# Patient Record
Sex: Female | Born: 1965 | Race: White | Hispanic: No | Marital: Married | Smoking: Never smoker
Health system: Southern US, Community
[De-identification: ages and names within clinical notes are randomized; demographics above are authoritative.]

## PROBLEM LIST (undated history)

## (undated) DIAGNOSIS — Z8709 Personal history of other diseases of the respiratory system: Secondary | ICD-10-CM

## (undated) DIAGNOSIS — T8859XA Other complications of anesthesia, initial encounter: Secondary | ICD-10-CM

## (undated) DIAGNOSIS — N200 Calculus of kidney: Secondary | ICD-10-CM

## (undated) DIAGNOSIS — IMO0001 Reserved for inherently not codable concepts without codable children: Secondary | ICD-10-CM

## (undated) DIAGNOSIS — G4730 Sleep apnea, unspecified: Secondary | ICD-10-CM

## (undated) DIAGNOSIS — K648 Other hemorrhoids: Secondary | ICD-10-CM

## (undated) DIAGNOSIS — K760 Fatty (change of) liver, not elsewhere classified: Secondary | ICD-10-CM

## (undated) DIAGNOSIS — K219 Gastro-esophageal reflux disease without esophagitis: Secondary | ICD-10-CM

## (undated) DIAGNOSIS — M1990 Unspecified osteoarthritis, unspecified site: Secondary | ICD-10-CM

## (undated) DIAGNOSIS — E669 Obesity, unspecified: Secondary | ICD-10-CM

## (undated) DIAGNOSIS — G8929 Other chronic pain: Secondary | ICD-10-CM

## (undated) DIAGNOSIS — E119 Type 2 diabetes mellitus without complications: Secondary | ICD-10-CM

## (undated) DIAGNOSIS — J45909 Unspecified asthma, uncomplicated: Secondary | ICD-10-CM

## (undated) DIAGNOSIS — M25562 Pain in left knee: Secondary | ICD-10-CM

## (undated) DIAGNOSIS — T4145XA Adverse effect of unspecified anesthetic, initial encounter: Secondary | ICD-10-CM

## (undated) DIAGNOSIS — N289 Disorder of kidney and ureter, unspecified: Secondary | ICD-10-CM

## (undated) HISTORY — DX: Sleep apnea, unspecified: G47.30

## (undated) HISTORY — PX: CHOLECYSTECTOMY: SHX55

## (undated) HISTORY — DX: Other chronic pain: G89.29

## (undated) HISTORY — DX: Other hemorrhoids: K64.8

## (undated) HISTORY — DX: Other complications of anesthesia, initial encounter: T88.59XA

## (undated) HISTORY — DX: Type 2 diabetes mellitus without complications: E11.9

## (undated) HISTORY — PX: OOPHORECTOMY: SHX86

## (undated) HISTORY — DX: Gastro-esophageal reflux disease without esophagitis: K21.9

## (undated) HISTORY — PX: TUBAL LIGATION: SHX77

## (undated) HISTORY — DX: Fatty (change of) liver, not elsewhere classified: K76.0

## (undated) HISTORY — DX: Other chronic pain: M25.562

## (undated) HISTORY — PX: HERNIA REPAIR: SHX51

## (undated) HISTORY — DX: Adverse effect of unspecified anesthetic, initial encounter: T41.45XA

## (undated) HISTORY — PX: UPPER GASTROINTESTINAL ENDOSCOPY: SHX188

## (undated) HISTORY — DX: Unspecified asthma, uncomplicated: J45.909

## (undated) HISTORY — PX: KIDNEY STONE SURGERY: SHX686

---

## 2008-05-22 ENCOUNTER — Emergency Department (HOSPITAL_COMMUNITY): Admission: EM | Admit: 2008-05-22 | Discharge: 2008-05-22 | Payer: Self-pay | Admitting: Emergency Medicine

## 2009-05-12 ENCOUNTER — Emergency Department (HOSPITAL_COMMUNITY): Admission: EM | Admit: 2009-05-12 | Discharge: 2009-05-12 | Payer: Self-pay | Admitting: Emergency Medicine

## 2009-12-23 ENCOUNTER — Emergency Department (HOSPITAL_COMMUNITY): Admission: EM | Admit: 2009-12-23 | Discharge: 2009-12-23 | Payer: Self-pay | Admitting: Emergency Medicine

## 2010-05-19 ENCOUNTER — Emergency Department (HOSPITAL_COMMUNITY)
Admission: EM | Admit: 2010-05-19 | Discharge: 2010-05-19 | Disposition: A | Discharge status: Home / Self Care | Payer: Self-pay | Attending: Emergency Medicine | Admitting: Emergency Medicine

## 2010-05-19 DIAGNOSIS — R059 Cough, unspecified: Secondary | ICD-10-CM | POA: Insufficient documentation

## 2010-05-19 DIAGNOSIS — R062 Wheezing: Secondary | ICD-10-CM | POA: Insufficient documentation

## 2010-05-19 DIAGNOSIS — J40 Bronchitis, not specified as acute or chronic: Secondary | ICD-10-CM | POA: Insufficient documentation

## 2010-05-19 DIAGNOSIS — R05 Cough: Secondary | ICD-10-CM | POA: Insufficient documentation

## 2010-06-01 ENCOUNTER — Emergency Department (HOSPITAL_COMMUNITY)
Admission: EM | Admit: 2010-06-01 | Discharge: 2010-06-01 | Disposition: A | Discharge status: Home / Self Care | Payer: Self-pay | Attending: Emergency Medicine | Admitting: Emergency Medicine

## 2010-06-01 DIAGNOSIS — M545 Low back pain, unspecified: Secondary | ICD-10-CM | POA: Insufficient documentation

## 2010-06-01 DIAGNOSIS — S335XXA Sprain of ligaments of lumbar spine, initial encounter: Secondary | ICD-10-CM | POA: Insufficient documentation

## 2010-06-01 DIAGNOSIS — X58XXXA Exposure to other specified factors, initial encounter: Secondary | ICD-10-CM | POA: Insufficient documentation

## 2010-06-01 LAB — URINALYSIS, ROUTINE W REFLEX MICROSCOPIC
Bilirubin Urine: NEGATIVE
Glucose, UA: NEGATIVE mg/dL
Hgb urine dipstick: NEGATIVE
Ketones, ur: NEGATIVE mg/dL
Nitrite: NEGATIVE
Protein, ur: NEGATIVE mg/dL
Specific Gravity, Urine: 1.026 (ref 1.005–1.030)
Urobilinogen, UA: 0.2 mg/dL (ref 0.0–1.0)
pH: 5.5 (ref 5.0–8.0)

## 2010-06-01 LAB — URINE MICROSCOPIC-ADD ON

## 2010-12-02 ENCOUNTER — Emergency Department (HOSPITAL_COMMUNITY)
Admission: EM | Admit: 2010-12-02 | Discharge: 2010-12-02 | Disposition: A | Discharge status: Home / Self Care | Payer: Self-pay | Attending: Emergency Medicine | Admitting: Emergency Medicine

## 2010-12-02 DIAGNOSIS — L298 Other pruritus: Secondary | ICD-10-CM | POA: Insufficient documentation

## 2010-12-02 DIAGNOSIS — B353 Tinea pedis: Secondary | ICD-10-CM | POA: Insufficient documentation

## 2010-12-02 DIAGNOSIS — M79609 Pain in unspecified limb: Secondary | ICD-10-CM | POA: Insufficient documentation

## 2010-12-02 DIAGNOSIS — E669 Obesity, unspecified: Secondary | ICD-10-CM | POA: Insufficient documentation

## 2010-12-02 DIAGNOSIS — L2989 Other pruritus: Secondary | ICD-10-CM | POA: Insufficient documentation

## 2010-12-02 LAB — GLUCOSE, CAPILLARY: Glucose-Capillary: 123 mg/dL — ABNORMAL HIGH (ref 70–99)

## 2010-12-09 ENCOUNTER — Emergency Department (HOSPITAL_COMMUNITY)
Admission: EM | Admit: 2010-12-09 | Discharge: 2010-12-09 | Disposition: A | Discharge status: Home / Self Care | Payer: Self-pay | Attending: Emergency Medicine | Admitting: Emergency Medicine

## 2010-12-09 DIAGNOSIS — L02219 Cutaneous abscess of trunk, unspecified: Secondary | ICD-10-CM | POA: Insufficient documentation

## 2010-12-09 DIAGNOSIS — L03319 Cellulitis of trunk, unspecified: Secondary | ICD-10-CM | POA: Insufficient documentation

## 2011-08-28 ENCOUNTER — Emergency Department (HOSPITAL_COMMUNITY): Payer: Medicaid Other

## 2011-08-28 ENCOUNTER — Emergency Department (HOSPITAL_COMMUNITY)
Admission: EM | Admit: 2011-08-28 | Discharge: 2011-08-28 | Disposition: A | Discharge status: Home Health | Payer: Medicaid Other | Attending: Emergency Medicine | Admitting: Emergency Medicine

## 2011-08-28 ENCOUNTER — Encounter (HOSPITAL_COMMUNITY): Payer: Self-pay | Admitting: *Deleted

## 2011-08-28 DIAGNOSIS — R109 Unspecified abdominal pain: Secondary | ICD-10-CM | POA: Insufficient documentation

## 2011-08-28 DIAGNOSIS — N201 Calculus of ureter: Secondary | ICD-10-CM | POA: Insufficient documentation

## 2011-08-28 DIAGNOSIS — K429 Umbilical hernia without obstruction or gangrene: Secondary | ICD-10-CM | POA: Insufficient documentation

## 2011-08-28 DIAGNOSIS — N132 Hydronephrosis with renal and ureteral calculous obstruction: Secondary | ICD-10-CM

## 2011-08-28 DIAGNOSIS — R112 Nausea with vomiting, unspecified: Secondary | ICD-10-CM | POA: Insufficient documentation

## 2011-08-28 LAB — URINALYSIS, ROUTINE W REFLEX MICROSCOPIC
Glucose, UA: NEGATIVE mg/dL
Ketones, ur: 40 mg/dL — AB
Nitrite: POSITIVE — AB
Protein, ur: 30 mg/dL — AB
Specific Gravity, Urine: 1.033 — ABNORMAL HIGH (ref 1.005–1.030)
Urobilinogen, UA: >8 mg/dL — ABNORMAL HIGH (ref 0.0–1.0)
pH: 5 (ref 5.0–8.0)

## 2011-08-28 LAB — URINE MICROSCOPIC-ADD ON

## 2011-08-28 MED ORDER — CEPHALEXIN 500 MG PO CAPS: 500.0000 mg | ORAL_CAPSULE | Freq: Four times a day (QID) | ORAL | Status: DC

## 2011-08-28 MED ORDER — ONDANSETRON HCL 8 MG PO TABS: 8.0000 mg | ORAL_TABLET | Freq: Three times a day (TID) | ORAL | Status: DC | PRN

## 2011-08-28 MED ORDER — KETOROLAC TROMETHAMINE 30 MG/ML IJ SOLN
30.0000 mg | Freq: Once | INTRAMUSCULAR | Status: AC
  Administered 2011-08-28: 30 mg via INTRAVENOUS
  Filled 2011-08-28: qty 1

## 2011-08-28 MED ORDER — OXYCODONE-ACETAMINOPHEN 5-325 MG PO TABS: 1.0000 | ORAL_TABLET | Freq: Four times a day (QID) | ORAL | Status: DC | PRN

## 2011-08-28 MED ORDER — ONDANSETRON HCL 4 MG/2ML IJ SOLN
4.0000 mg | Freq: Once | INTRAMUSCULAR | Status: AC
  Administered 2011-08-28: 4 mg via INTRAVENOUS
  Filled 2011-08-28: qty 2

## 2011-08-28 MED ORDER — HYDROMORPHONE HCL PF 1 MG/ML IJ SOLN
1.0000 mg | Freq: Once | INTRAMUSCULAR | Status: AC
  Administered 2011-08-28: 1 mg via INTRAVENOUS
  Filled 2011-08-28: qty 1

## 2011-08-28 NOTE — Discharge Instructions (Signed)
Drink plenty of fluids. Strain urine. Take motrin or aleve as need for pain. You may also take percocet as need for pain - no driving for the next 6 hours or when taking percocet. Also, do not take tylenol or acetaminophen containing medications when taking percocet. Take zofran as need for nausea. Take antibiotic as prescribed for possible urine infection.   Follow up with urologist in next few days - call office tomorrow morning to arrange appointment.  Return to [facility] ([facility] is the hospital where our urologist take care of their patients) if worse, intractable pain, fevers, persistent vomiting, other concern.   Your ct scan shows a large 1 by 1.5 cm kidney stone at the left UPJ - discuss with urologist at follow up.  The ct also made incidental note of umbilical area hernia - follow up with general surgeon in the next few weeks.   Your blood pressure is high today - follow up with primary care doctor in the next few weeks.      Kidney Stones Kidney stones (ureteral lithiasis) are deposits that form inside your kidneys. The intense pain is caused by the stone moving through the urinary tract. When the stone moves, the ureter goes into spasm around the stone. The stone is usually passed in the urine.  CAUSES   A disorder that makes certain neck glands produce too much parathyroid hormone (primary hyperparathyroidism).   A buildup of uric acid crystals.   Narrowing (stricture) of the ureter.   A kidney obstruction present at birth (congenital obstruction).   Previous surgery on the kidney or ureters.   Numerous kidney infections.  SYMPTOMS   Feeling sick to your stomach (nauseous).   Throwing up (vomiting).   Blood in the urine (hematuria).   Pain that usually spreads (radiates) to the groin.   Frequency or urgency of urination.  DIAGNOSIS   Taking a history and physical exam.   Blood or urine tests.   Computerized X-ray scan (CT scan).   Occasionally, an  examination of the inside of the urinary bladder (cystoscopy) is performed.  TREATMENT   Observation.   Increasing your fluid intake.   Surgery may be needed if you have severe pain or persistent obstruction.  The size, location, and chemical composition are all important variables that will determine the proper choice of action for you. Talk to your caregiver to better understand your situation so that you will minimize the risk of injury to yourself and your kidney.  HOME CARE INSTRUCTIONS   Drink enough water and fluids to keep your urine clear or pale yellow.   Strain all urine through the provided strainer. Keep all particulate matter and stones for your caregiver to see. The stone causing the pain may be as small as a grain of salt. It is very important to use the strainer each and every time you pass your urine. The collection of your stone will allow your caregiver to analyze it and verify that a stone has actually passed.   Only take over-the-counter or prescription medicines for pain, discomfort, or fever as directed by your caregiver.   Make a follow-up appointment with your caregiver as directed.   Get follow-up X-rays if required. The absence of pain does not always mean that the stone has passed. It may have only stopped moving. If the urine remains completely obstructed, it can cause loss of kidney function or even complete destruction of the kidney. It is your responsibility to make sure  X-rays and follow-ups are completed. Ultrasounds of the kidney can show blockages and the status of the kidney. Ultrasounds are not associated with any radiation and can be performed easily in a matter of minutes.  SEEK IMMEDIATE MEDICAL CARE IF:   Pain cannot be controlled with the prescribed medicine.   You have a fever.   The severity or intensity of pain increases over 18 hours and is not relieved by pain medicine.   You develop a new onset of abdominal pain.   You feel faint or pass  out.  MAKE SURE YOU:   Understand these instructions.   Will watch your condition.   Will get help right away if you are not doing well or get worse.  Document Released: 03/05/2005 Document Revised: 02/22/2011 Document Reviewed: 07/01/2009 [facility] Patient Information 2012 [place], [place].       Hernia A hernia occurs when an internal organ pushes out through a weak spot in the abdominal wall. Hernias most commonly occur in the groin and around the navel. Hernias often can be pushed back into place (reduced). Most hernias tend to get worse over time. Some abdominal hernias can get stuck in the opening (irreducible or incarcerated hernia) and cannot be reduced. An irreducible abdominal hernia which is tightly squeezed into the opening is at risk for impaired blood supply (strangulated hernia). A strangulated hernia is a medical emergency. Because of the risk for an irreducible or strangulated hernia, surgery may be recommended to repair a hernia. CAUSES   Heavy lifting.   Prolonged coughing.   Straining to have a bowel movement.   A cut (incision) made during an abdominal surgery.  HOME CARE INSTRUCTIONS   Bed rest is not required. You may continue your normal activities.   Avoid lifting more than 10 pounds (4.5 kg) or straining.   Cough gently. If you are a smoker it is best to stop. Even the best hernia repair can break down with the continual strain of coughing. Even if you do not have your hernia repaired, a cough will continue to aggravate the problem.   Do not wear anything tight over your hernia. Do not try to keep it in with an outside bandage or truss. These can damage abdominal contents if they are trapped within the hernia sac.   Eat a normal diet.   Avoid constipation. Straining over long periods of time will increase hernia size and encourage breakdown of repairs. If you cannot do this with diet alone, stool softeners may be used.  SEEK IMMEDIATE MEDICAL CARE IF:     You have a fever.   You develop increasing abdominal pain.   You feel nauseous or vomit.   Your hernia is stuck outside the abdomen, looks discolored, feels hard, or is tender.   You have any changes in your bowel habits or in the hernia that are unusual for you.   You have increased pain or swelling around the hernia.   You cannot push the hernia back in place by applying gentle pressure while lying down.  MAKE SURE YOU:   Understand these instructions.   Will watch your condition.   Will get help right away if you are not doing well or get worse.  Document Released: 03/05/2005 Document Revised: 02/22/2011 Document Reviewed: 10/23/2007 [facility] Patient Information 2012 [place], [place].

## 2011-08-28 NOTE — ED Provider Notes (Signed)
History    This chart was scribed for Suzi Roots, MD, MD by Smitty Pluck. The patient was seen in room STRE8 and the patient's care was started at 1:19PM.   CSN: [id number]  Arrival date & time 08/28/11  1208   None     Chief Complaint  Patient presents with  . Back Pain    (Consider location/radiation/quality/duration/timing/severity/associated sxs/prior treatment) Patient is a 46 y.o. female presenting with back pain. The history is provided by the patient.  Back Pain  Pertinent negatives include no fever, no abdominal pain and no dysuria.   Rita Bass is a 46 y.o. female who presents to the Emergency Department complaining of moderate lower left side back pain radiating to abdominal area onset today this AM. Pain is dull. Constant. Sudden onset at rest. Denies dysuria. Reports that LMP was 08-07-11. Denies vaginal discharge and bleeding. Denies leg pain. Symptoms have been constant. She denies injury, twisting or lifting. Reports nausea and vomiting. Denies dysuria, fever and chills. Pt denies any other pain. Having normal bms. No diarrhea or constipation. No trauma or strain to back.   History reviewed. No pertinent past medical history.  Past Surgical History  Procedure Date  . Oophorectomy   . Cesarean section   . Cholecystectomy   . Hernia repair     No family history on file.  History  Substance Use Topics  . Smoking status: Never Smoker   . Smokeless tobacco: Not on file  . Alcohol Use: No    OB History    Grav Para Term Preterm Abortions TAB SAB Ect Mult Living                  Review of Systems  Constitutional: Negative for fever and chills.  Gastrointestinal: Positive for nausea and vomiting. Negative for abdominal pain, diarrhea and constipation.  Genitourinary: Negative for dysuria, frequency, hematuria, flank pain, vaginal bleeding, vaginal discharge and menstrual problem.  Musculoskeletal: Positive for back pain.    Allergies  Review of  patient's allergies indicates no known allergies.  Home Medications   Current Outpatient Rx  Name Route Sig Dispense Refill  . IBUPROFEN 200 MG PO TABS Oral Take 800 mg by mouth every 8 (eight) hours as needed. For pain      BP 151/79  Pulse 95  Temp(Src) 98.2 F (36.8 C) (Oral)  Resp 18  Ht 5\' 4"  (1.626 m)  Wt 280 lb (127.007 kg)  BMI 48.06 kg/m2  SpO2 97%  Physical Exam  Nursing note and vitals reviewed. Constitutional: She is oriented to person, place, and time. She appears well-developed and well-nourished. No distress.  HENT:  Head: Normocephalic and atraumatic.  Eyes: Conjunctivae are normal. Pupils are equal, round, and reactive to light. No scleral icterus.  Neck: Normal range of motion. Neck supple. No tracheal deviation present.  Cardiovascular: Normal rate, regular rhythm, normal heart sounds and intact distal pulses.   Pulmonary/Chest: Effort normal and breath sounds normal. No respiratory distress.  Abdominal: Soft. Bowel sounds are normal. She exhibits no distension and no mass. There is no tenderness. There is no rebound and no guarding.       No flank tenderness. No hernia.    Genitourinary:       No cva tenderness  Musculoskeletal: Normal range of motion.       tls spine non tender, aligned, no step off.   Neurological: She is alert and oriented to person, place, and time.  Motor intact bil. Steady gait. Straight leg raise neg.   Skin: Skin is warm and dry. No rash noted.       No skin lesions or shingles in area of pain  Psychiatric: She has a normal mood and affect. Her behavior is normal.    ED Course  Procedures (including critical care time) DIAGNOSTIC STUDIES: Oxygen Saturation is 97% on room air, normal by my interpretation.    COORDINATION OF CARE: 1:22PM EDP discusses pt ED treatment with pt  1:22PM EDP orders medication: Toradol 30 mg, dilaudid 1 mg, Zofran 4 mg   Results for orders placed during the hospital encounter of 08/28/11    URINALYSIS, ROUTINE W REFLEX MICROSCOPIC      Component Value Range   Color, Urine ORANGE (*) YELLOW    APPearance HAZY (*) CLEAR    Specific Gravity, Urine 1.033 (*) 1.005 - 1.030    pH 5.0  5.0 - 8.0    Glucose, UA NEGATIVE  NEGATIVE (mg/dL)   Hgb urine dipstick NEGATIVE  NEGATIVE    Bilirubin Urine SMALL (*) NEGATIVE    Ketones, ur 40 (*) NEGATIVE (mg/dL)   Protein, ur 30 (*) NEGATIVE (mg/dL)   Urobilinogen, UA >4.0 (*) 0.0 - 1.0 (mg/dL)   Nitrite POSITIVE (*) NEGATIVE    Leukocytes, UA LARGE (*) NEGATIVE   URINE MICROSCOPIC-ADD ON      Component Value Range   Squamous Epithelial / LPF MANY (*) RARE    WBC, UA 3-6  <3 (WBC/hpf)   RBC / HPF 0-2  <3 (RBC/hpf)   Bacteria, UA RARE  RARE    Casts GRANULAR CAST (*) NEGATIVE    Crystals CA OXALATE CRYSTALS (*) NEGATIVE    Ct Abdomen Pelvis Wo Contrast  08/28/2011  *RADIOLOGY REPORT*  Clinical Data: Acute left flank pain.  Rule out ureteral calculus  CT ABDOMEN AND PELVIS WITHOUT CONTRAST  Technique:  Multidetector CT imaging of the abdomen and pelvis was performed following the standard protocol without intravenous contrast.  Comparison: None  Findings: The lung bases are clear.  No pericardial or pleural effusion.  Previous cholecystectomy.  No suspicious liver abnormality.  No biliary ductal dilatation.  The pancreas is normal.  The spleen is normal.  Both adrenal glands are unremarkable.  Normal appearance of the right kidney.  There is a left-sided nephromegaly, perinephric fat stranding and hydronephrosis.  At the left UPJ there is a 0.9 x 1.0 x 1.5 cm calculus.  No additional renal calculi identified.  The urinary bladder appears normal.  The uterus and the adnexal structures are unremarkable.  The stomach is normal.  The small bowel loops have a normal course and caliber.  Normal appearance of the colon  No free fluid or fluid collections within the abdomen or pelvis. There are multiple peri umbilical hernia as involving the ventral  abdominal wall.  These contains omental fat only.  Review of the visualized osseous structures is unremarkable.  IMPRESSION:  1.  Left UPJ stone with resultant obstructive uropathy.  Original Report Authenticated By: Rosealee Albee, M.D.         MDM  I personally performed the services described in this documentation, which was scribed in my presence. The recorded information has been reviewed and considered. Suzi Roots, MD   Labs sent. Iv ns. Dilaudid 1 mg iv. zofran iv. toradol iv. Note - pt states she did not drive here, has ride home.   Recheck pain resolved. abd soft nt.   Discussed  ct w pt and that given size of stone, good chance that it will not pass on its own and need for close urology follow up.        Suzi Roots, MD 08/28/11 [phone]

## 2011-08-28 NOTE — ED Notes (Signed)
Patient complains of left sided lower back pain, lower abd pain and nausea.  She states her pain started last night and is severe.  Patient reports normal urine output, denies pain when voiding

## 2011-08-30 ENCOUNTER — Encounter (HOSPITAL_COMMUNITY): Payer: Self-pay | Admitting: *Deleted

## 2011-08-30 ENCOUNTER — Emergency Department (HOSPITAL_COMMUNITY): Admission: EM | Admit: 2011-08-30 | Discharge: 2011-08-30 | Discharge status: Against Medical Advice | Payer: Self-pay

## 2011-08-30 ENCOUNTER — Emergency Department (HOSPITAL_COMMUNITY)
Admission: EM | Admit: 2011-08-30 | Discharge: 2011-08-31 | Discharge status: Against Medical Advice | Payer: Medicaid Other | Attending: Emergency Medicine | Admitting: Emergency Medicine

## 2011-08-30 DIAGNOSIS — R109 Unspecified abdominal pain: Secondary | ICD-10-CM | POA: Insufficient documentation

## 2011-08-30 DIAGNOSIS — Z87442 Personal history of urinary calculi: Secondary | ICD-10-CM | POA: Insufficient documentation

## 2011-08-30 NOTE — ED Notes (Signed)
Called for room; not answered

## 2011-08-30 NOTE — ED Notes (Signed)
Pt in ER waiting; left and went home and called EMS; brought back in by EMS; c/o left flank and back pain; diagnosed with kidney stones/hernia on Tuesday; ambulatory on arrival

## 2011-08-31 ENCOUNTER — Encounter (HOSPITAL_COMMUNITY): Payer: Self-pay | Admitting: Physical Medicine and Rehabilitation

## 2011-08-31 ENCOUNTER — Emergency Department (HOSPITAL_COMMUNITY)
Admission: EM | Admit: 2011-08-31 | Discharge: 2011-08-31 | Disposition: A | Discharge status: Home / Self Care | Payer: Medicaid Other | Attending: Emergency Medicine | Admitting: Emergency Medicine

## 2011-08-31 DIAGNOSIS — Z9089 Acquired absence of other organs: Secondary | ICD-10-CM | POA: Insufficient documentation

## 2011-08-31 DIAGNOSIS — N200 Calculus of kidney: Secondary | ICD-10-CM

## 2011-08-31 LAB — URINALYSIS, ROUTINE W REFLEX MICROSCOPIC
Bilirubin Urine: NEGATIVE
Glucose, UA: NEGATIVE mg/dL
Hgb urine dipstick: NEGATIVE
Ketones, ur: NEGATIVE mg/dL
Nitrite: NEGATIVE
Protein, ur: NEGATIVE mg/dL
Specific Gravity, Urine: 1.008 (ref 1.005–1.030)
pH: 6 (ref 5.0–8.0)

## 2011-08-31 LAB — URINE MICROSCOPIC-ADD ON

## 2011-08-31 MED ORDER — HYDROMORPHONE HCL PF 2 MG/ML IJ SOLN
2.0000 mg | Freq: Once | INTRAMUSCULAR | Status: AC
  Administered 2011-08-31: 2 mg via INTRAMUSCULAR
  Filled 2011-08-31: qty 1

## 2011-08-31 MED ORDER — IBUPROFEN 600 MG PO TABS: 600.0000 mg | ORAL_TABLET | Freq: Four times a day (QID) | ORAL | Status: AC | PRN

## 2011-08-31 MED ORDER — ONDANSETRON HCL 4 MG/2ML IJ SOLN
4.0000 mg | Freq: Once | INTRAMUSCULAR | Status: AC
  Administered 2011-08-31: 4 mg via INTRAMUSCULAR
  Filled 2011-08-31: qty 2

## 2011-08-31 MED ORDER — KETOROLAC TROMETHAMINE 60 MG/2ML IM SOLN
60.0000 mg | Freq: Once | INTRAMUSCULAR | Status: AC
  Administered 2011-08-31: 60 mg via INTRAMUSCULAR
  Filled 2011-08-31: qty 2

## 2011-08-31 MED ORDER — OXYCODONE-ACETAMINOPHEN 5-325 MG PO TABS: 1.0000 | ORAL_TABLET | ORAL | Status: AC | PRN

## 2011-08-31 NOTE — ED Notes (Signed)
Pt presents to department for evaluation of L sided flank pain radiating around to front of abdomen. Was seen [facility] last Tuesday for same and discharged home. States some relief with pain medication at home. Denies pain with urination, denies blood in urine. 10/10 pain upon arrival. Also states nausea. She is alert and oriented x4.

## 2011-08-31 NOTE — Discharge Instructions (Signed)
You have a very large kidney stone which most likely will not pass on its own. It is important that you get in with urology as soon as able. i spoke with Dr. Berneice Heinrich today, he instructed you to call office Monday and see if you can be worked in. Please explain to the office staff you were in ED twice and we spoke with a urologist. Continue pain medications. If pain worsens, or develop fever, go to [facility].   Kidney Stones Kidney stones (ureteral lithiasis) are deposits that form inside your kidneys. The intense pain is caused by the stone moving through the urinary tract. When the stone moves, the ureter goes into spasm around the stone. The stone is usually passed in the urine.  CAUSES   A disorder that makes certain neck glands produce too much parathyroid hormone (primary hyperparathyroidism).   A buildup of uric acid crystals.   Narrowing (stricture) of the ureter.   A kidney obstruction present at birth (congenital obstruction).   Previous surgery on the kidney or ureters.   Numerous kidney infections.  SYMPTOMS   Feeling sick to your stomach (nauseous).   Throwing up (vomiting).   Blood in the urine (hematuria).   Pain that usually spreads (radiates) to the groin.   Frequency or urgency of urination.  DIAGNOSIS   Taking a history and physical exam.   Blood or urine tests.   Computerized X-ray scan (CT scan).   Occasionally, an examination of the inside of the urinary bladder (cystoscopy) is performed.  TREATMENT   Observation.   Increasing your fluid intake.   Surgery may be needed if you have severe pain or persistent obstruction.  The size, location, and chemical composition are all important variables that will determine the proper choice of action for you. Talk to your caregiver to better understand your situation so that you will minimize the risk of injury to yourself and your kidney.  HOME CARE INSTRUCTIONS   Drink enough water and fluids to keep your  urine clear or pale yellow.   Strain all urine through the provided strainer. Keep all particulate matter and stones for your caregiver to see. The stone causing the pain may be as small as a grain of salt. It is very important to use the strainer each and every time you pass your urine. The collection of your stone will allow your caregiver to analyze it and verify that a stone has actually passed.   Only take over-the-counter or prescription medicines for pain, discomfort, or fever as directed by your caregiver.   Make a follow-up appointment with your caregiver as directed.   Get follow-up X-rays if required. The absence of pain does not always mean that the stone has passed. It may have only stopped moving. If the urine remains completely obstructed, it can cause loss of kidney function or even complete destruction of the kidney. It is your responsibility to make sure X-rays and follow-ups are completed. Ultrasounds of the kidney can show blockages and the status of the kidney. Ultrasounds are not associated with any radiation and can be performed easily in a matter of minutes.  SEEK IMMEDIATE MEDICAL CARE IF:   Pain cannot be controlled with the prescribed medicine.   You have a fever.   The severity or intensity of pain increases over 18 hours and is not relieved by pain medicine.   You develop a new onset of abdominal pain.   You feel faint or pass out.  MAKE  SURE YOU:   Understand these instructions.   Will watch your condition.   Will get help right away if you are not doing well or get worse.  Document Released: 03/05/2005 Document Revised: 02/22/2011 Document Reviewed: 07/01/2009 [facility] Patient Information 2012 [place], [place].

## 2011-08-31 NOTE — ED Provider Notes (Signed)
History     CSN: [id number]  Arrival date & time 08/31/11  1745   First MD Initiated Contact with Patient 08/31/11 2002      Chief Complaint  Patient presents with  . Flank Pain    (Consider location/radiation/quality/duration/timing/severity/associated sxs/prior treatment) Patient is a 46 y.o. female presenting with flank pain. The history is provided by the patient.  Flank Pain This is a new problem. The current episode started in the past 7 days. The problem occurs constantly. The problem has been unchanged. Associated symptoms include abdominal pain, nausea, urinary symptoms and vomiting. Pertinent negatives include no anorexia, chills, fever, numbness or weakness. Nothing aggravates the symptoms. She has tried nothing for the symptoms.  Pt states she was diagnosed with a kidney stone 3 days ago. States was given pain medications and follow up with urology. Unable to get an appointment until next wed (6 days from today.) States pain constat, nausea, vomiting. No fever. States almost out of pain medications. States pain medications only help slightly.   No past medical history on file.  Past Surgical History  Procedure Date  . Oophorectomy   . Cesarean section   . Cholecystectomy   . Hernia repair     No family history on file.  History  Substance Use Topics  . Smoking status: Never Smoker   . Smokeless tobacco: Not on file  . Alcohol Use: No    OB History    Grav Para Term Preterm Abortions TAB SAB Ect Mult Living                  Review of Systems  Constitutional: Negative for fever and chills.  Respiratory: Negative.   Cardiovascular: Negative.   Gastrointestinal: Positive for nausea, vomiting and abdominal pain. Negative for anorexia.  Genitourinary: Positive for dysuria and flank pain. Negative for urgency and hematuria.  Neurological: Negative for dizziness, weakness and numbness.    Allergies  Review of patient's allergies indicates no known  allergies.  Home Medications   Current Outpatient Rx  Name Route Sig Dispense Refill  . CEPHALEXIN 500 MG PO CAPS Oral Take 500 mg by mouth 4 (four) times daily.    Marland Kitchen ONDANSETRON HCL 8 MG PO TABS Oral Take 8 mg by mouth every 8 (eight) hours as needed. For nausea    . OXYCODONE-ACETAMINOPHEN 5-325 MG PO TABS Oral Take 1-2 tablets by mouth every 6 (six) hours as needed. For pain      BP 166/105  Pulse 80  Temp 98.3 F (36.8 C) (Oral)  Resp 22  SpO2 97%  LMP 08/07/2011  Physical Exam  Nursing note and vitals reviewed. Constitutional: She appears well-developed and well-nourished. No distress.  HENT:  Head: Normocephalic.  Neck: Neck supple.  Cardiovascular: Normal rate, regular rhythm and normal heart sounds.   Pulmonary/Chest: Effort normal and breath sounds normal. No respiratory distress. She has no wheezes. She has no rales.  Abdominal: Soft. Bowel sounds are normal. She exhibits no distension.       Left CVA tenderness, left lower abdominal tenderness  Skin: Skin is warm and dry.  Psychiatric: She has a normal mood and affect.    ED Course  Procedures (including critical care time)  Pt with known 1cmx1.5 cm kidney stone in left IPJ, with hydronephrosis. Pt afebrile, non toxic appearing. UA pending.   Results for orders placed during the hospital encounter of 08/31/11  URINALYSIS, ROUTINE W REFLEX MICROSCOPIC      Component Value Range  Color, Urine YELLOW  YELLOW   APPearance CLEAR  CLEAR   Specific Gravity, Urine 1.008  1.005 - 1.030   pH 6.0  5.0 - 8.0   Glucose, UA NEGATIVE  NEGATIVE mg/dL   Hgb urine dipstick NEGATIVE  NEGATIVE   Bilirubin Urine NEGATIVE  NEGATIVE   Ketones, ur NEGATIVE  NEGATIVE mg/dL   Protein, ur NEGATIVE  NEGATIVE mg/dL   Urobilinogen, UA 0.2  0.0 - 1.0 mg/dL   Nitrite NEGATIVE  NEGATIVE   Leukocytes, UA TRACE (*) NEGATIVE  URINE MICROSCOPIC-ADD ON      Component Value Range   Squamous Epithelial / LPF FEW (*) RARE   WBC, UA 0-2  <3  WBC/hpf   Ct Abdomen Pelvis Wo Contrast  08/28/2011  *RADIOLOGY REPORT*  Clinical Data: Acute left flank pain.  Rule out ureteral calculus  CT ABDOMEN AND PELVIS WITHOUT CONTRAST  Technique:  Multidetector CT imaging of the abdomen and pelvis was performed following the standard protocol without intravenous contrast.  Comparison: None  Findings: The lung bases are clear.  No pericardial or pleural effusion.  Previous cholecystectomy.  No suspicious liver abnormality.  No biliary ductal dilatation.  The pancreas is normal.  The spleen is normal.  Both adrenal glands are unremarkable.  Normal appearance of the right kidney.  There is a left-sided nephromegaly, perinephric fat stranding and hydronephrosis.  At the left UPJ there is a 0.9 x 1.0 x 1.5 cm calculus.  No additional renal calculi identified.  The urinary bladder appears normal.  The uterus and the adnexal structures are unremarkable.  The stomach is normal.  The small bowel loops have a normal course and caliber.  Normal appearance of the colon  No free fluid or fluid collections within the abdomen or pelvis. There are multiple peri umbilical hernia as involving the ventral abdominal wall.  These contains omental fat only.  Review of the visualized osseous structures is unremarkable.  IMPRESSION:  1.  Left UPJ stone with resultant obstructive uropathy.  Original Report Authenticated By: Rosealee Albee, M.D.    9:07 PM I spoke with Dr. Berneice Heinrich to see if I can get pt appointment closer. Dr. Berneice Heinrich instructed to call on Monday to the office and be worked in. Pt does not have any signs of infection. Urine does not appear infected. She is non toxic. Dr. Berneice Heinrich also offered to get pt admitted and stented this weekend. Pt does not want to be admitted at this time. States she will try additional pain medications and follow up next week. I instructed pt to call or return to [facility] ed if her symptoms worsen or develop fever.   1. Kidney stone on left side        MDM          Lottie Mussel, PA 09/01/11 [phone]

## 2011-08-31 NOTE — ED Notes (Signed)
Alert, NAD, calm, interactive, "feels better", rates pain 5/10, given Rx x2.

## 2011-09-01 NOTE — ED Provider Notes (Signed)
Medical screening examination/treatment/procedure(s) were performed by non-physician practitioner and as supervising physician I was immediately available for consultation/collaboration.  Jaliza Seifried, MD 09/01/11 [id number] 

## 2013-05-08 ENCOUNTER — Emergency Department (HOSPITAL_COMMUNITY): Payer: Self-pay

## 2013-05-08 ENCOUNTER — Encounter (HOSPITAL_COMMUNITY): Payer: Self-pay | Admitting: Emergency Medicine

## 2013-05-08 ENCOUNTER — Emergency Department (HOSPITAL_COMMUNITY)
Admission: EM | Admit: 2013-05-08 | Discharge: 2013-05-08 | Disposition: A | Discharge status: Home / Self Care | Payer: Medicaid Other | Attending: Emergency Medicine | Admitting: Emergency Medicine

## 2013-05-08 ENCOUNTER — Emergency Department (HOSPITAL_COMMUNITY): Payer: Medicaid Other

## 2013-05-08 DIAGNOSIS — M25561 Pain in right knee: Secondary | ICD-10-CM

## 2013-05-08 DIAGNOSIS — M25569 Pain in unspecified knee: Secondary | ICD-10-CM | POA: Insufficient documentation

## 2013-05-08 DIAGNOSIS — Z87442 Personal history of urinary calculi: Secondary | ICD-10-CM | POA: Insufficient documentation

## 2013-05-08 DIAGNOSIS — R609 Edema, unspecified: Secondary | ICD-10-CM | POA: Insufficient documentation

## 2013-05-08 DIAGNOSIS — IMO0001 Reserved for inherently not codable concepts without codable children: Secondary | ICD-10-CM | POA: Insufficient documentation

## 2013-05-08 DIAGNOSIS — R000 Tachycardia, unspecified: Secondary | ICD-10-CM | POA: Insufficient documentation

## 2013-05-08 DIAGNOSIS — M7989 Other specified soft tissue disorders: Secondary | ICD-10-CM

## 2013-05-08 DIAGNOSIS — E669 Obesity, unspecified: Secondary | ICD-10-CM | POA: Insufficient documentation

## 2013-05-08 DIAGNOSIS — R0602 Shortness of breath: Secondary | ICD-10-CM | POA: Insufficient documentation

## 2013-05-08 HISTORY — DX: Disorder of kidney and ureter, unspecified: N28.9

## 2013-05-08 LAB — BASIC METABOLIC PANEL
BUN: 13 mg/dL (ref 6–23)
CALCIUM: 9.9 mg/dL (ref 8.4–10.5)
CO2: 27 mEq/L (ref 19–32)
CREATININE: 0.6 mg/dL (ref 0.50–1.10)
Chloride: 97 mEq/L (ref 96–112)
GFR calc Af Amer: >90 mL/min (ref >90)
GFR calc non Af Amer: >90 mL/min (ref >90)
Glucose, Bld: 110 mg/dL — ABNORMAL HIGH (ref 70–99)
Potassium: 4.3 mEq/L (ref 3.7–5.3)
Sodium: 139 mEq/L (ref 137–147)

## 2013-05-08 LAB — CBC
HCT: 38.6 % (ref 36.0–46.0)
Hemoglobin: 12.3 g/dL (ref 12.0–15.0)
MCH: 22.4 pg — ABNORMAL LOW (ref 26.0–34.0)
MCHC: 31.9 g/dL (ref 30.0–36.0)
MCV: 70.3 fL — AB (ref 78.0–100.0)
PLATELETS: 393 10*3/uL (ref 150–400)
RBC: 5.49 MIL/uL — AB (ref 3.87–5.11)
RDW: 17.5 % — AB (ref 11.5–15.5)
WBC: 11.4 10*3/uL — ABNORMAL HIGH (ref 4.0–10.5)

## 2013-05-08 MED ORDER — IOHEXOL 350 MG/ML SOLN
100.0000 mL | Freq: Once | INTRAVENOUS | Status: AC | PRN
  Administered 2013-05-08: 100 mL via INTRAVENOUS

## 2013-05-08 MED ORDER — IBUPROFEN 800 MG PO TABS: 800.0000 mg | ORAL_TABLET | Freq: Three times a day (TID) | ORAL | Status: DC | PRN

## 2013-05-08 NOTE — ED Notes (Signed)
Patient transported to X-ray 

## 2013-05-08 NOTE — ED Notes (Signed)
Pt back from CT and vascular study.

## 2013-05-08 NOTE — Progress Notes (Signed)
*  PRELIMINARY RESULTS* Vascular Ultrasound Lower extremity venous duplex has been completed.  Preliminary findings: no evidence of DVT bilaterally. A baker's cyst is noted on the right.   Farrel DemarkJill Eunice, RDMS, RVT  05/08/2013, 4:27 PM

## 2013-05-08 NOTE — ED Provider Notes (Signed)
CSN: [id number]     Arrival date & time 05/08/13  1302 History   First MD Initiated Contact with Patient 05/08/13 1332     Chief Complaint  Patient presents with  . Leg Swelling     (Consider location/radiation/quality/duration/timing/severity/associated sxs/prior Treatment) The history is provided by the patient and medical records. No language interpreter was used.    Pervis HockingDebbie Mcbean is a 48 y.o. female  with a hx of kidney stones presents to the Emergency Department complaining of gradual, persistent, progressively worsening right knee and leg pain onset several weeks ago but with acute worsening 3 days ago. Associated symptoms include SOB with exertion.  She reports walking and stairs make both the knee pain and SOB worse and rest makes them both some better but does not resolved the symptoms.  She has not tried any OTC medications for the pain.  She denies recent surgery, immobilization, travel, smoking or exogenous estrogen use.  Pt also denies fever, chills, headache, chest pain, abdominal pain, nausea, vomiting, diarrhea, weakness, dizziness, syncope, dysuria.  Denies history of blood clot or cardiac conditions.  She's not currently anticoagulated and has never taken any blood thinners.  Past Medical History  Diagnosis Date  . Renal disorder    Past Surgical History  Procedure Laterality Date  . Oophorectomy    . Cesarean section    . Cholecystectomy    . Hernia repair     No family history on file. History  Substance Use Topics  . Smoking status: Never Smoker   . Smokeless tobacco: Not on file  . Alcohol Use: No   OB History   Grav Para Term Preterm Abortions TAB SAB Ect Mult Living                 Review of Systems  Constitutional: Negative for fever, diaphoresis, appetite change, fatigue and unexpected weight change.  HENT: Negative for mouth sores.   Eyes: Negative for visual disturbance.  Respiratory: Positive for shortness of breath. Negative for cough, chest  tightness and wheezing.   Cardiovascular: Positive for leg swelling. Negative for chest pain.  Gastrointestinal: Negative for nausea, vomiting, abdominal pain, diarrhea and constipation.  Endocrine: Negative for polydipsia, polyphagia and polyuria.  Genitourinary: Negative for dysuria, urgency, frequency and hematuria.  Musculoskeletal: Positive for arthralgias and myalgias. Negative for back pain and neck stiffness.  Skin: Negative for rash.  Allergic/Immunologic: Negative for immunocompromised state.  Neurological: Negative for syncope, light-headedness and headaches.  Hematological: Does not bruise/bleed easily.  Psychiatric/Behavioral: Negative for sleep disturbance. The patient is not nervous/anxious.       Allergies  Review of patient's allergies indicates no known allergies.  Home Medications   Current Outpatient Rx  Name  Route  Sig  Dispense  Refill  . ibuprofen (ADVIL,MOTRIN) 800 MG tablet   Oral   Take 1 tablet (800 mg total) by mouth every 8 (eight) hours as needed for moderate pain. With food   21 tablet   0    BP 114/92  Pulse 98  Temp(Src) 98.2 F (36.8 C) (Oral)  Resp 23  Ht 5\' 4"  (1.626 m)  Wt 343 lb (155.584 kg)  BMI 58.85 kg/m2  SpO2 97%  LMP 05/03/2013 Physical Exam  Nursing note and vitals reviewed. Constitutional: She appears well-developed and well-nourished. No distress.  Awake, alert, nontoxic appearance Obese  HENT:  Head: Normocephalic and atraumatic.  Mouth/Throat: Oropharynx is clear and moist. No oropharyngeal exudate.  Eyes: Conjunctivae are normal. No scleral icterus.  Neck:  Normal range of motion. Neck supple.  Cardiovascular: Regular rhythm, normal heart sounds and intact distal pulses.   No murmur heard. Capillary refill less than 3 seconds Mild tachycardia  Pulmonary/Chest: Effort normal and breath sounds normal. No respiratory distress. She has no wheezes.  Clear and equal breath sounds No rales, rhonchi or wheezes   Abdominal: Soft. Bowel sounds are normal. She exhibits no distension and no mass. There is no tenderness. There is no rebound and no guarding.  Obese Soft and nontender  Musculoskeletal: Normal range of motion. She exhibits tenderness. She exhibits no edema.       Right knee: Tenderness (generalized) found.  ROM: Full ROM with pain under the patella Mild generalized tenderness to the right knee, no joint line tenderness  Neurological: She is alert. Coordination normal.  Speech is clear and goal oriented Moves extremities without ataxia Sensation intact to bilateral lower extremities Strength 5 out of 5 in bilateral Lower Extremitites  Skin: Skin is warm and dry. No rash noted. She is not diaphoretic. No erythema.  No tenting of the skin No erythema of the bilateral lower legs  Psychiatric: She has a normal mood and affect.    ED Course  Procedures (including critical care time) Labs Review Labs Reviewed  CBC - Abnormal; Notable for the following:    WBC 11.4 (*)    RBC 5.49 (*)    MCV 70.3 (*)    MCH 22.4 (*)    RDW 17.5 (*)    All other components within normal limits  BASIC METABOLIC PANEL - Abnormal; Notable for the following:    Glucose, Bld 110 (*)    All other components within normal limits   Imaging Review Ct Angio Chest Pe W/cm &/or Wo Cm  05/08/2013   CLINICAL DATA:  Shortness of breath and lower extremity swelling.  EXAM: CT ANGIOGRAPHY CHEST WITH CONTRAST  TECHNIQUE: Multidetector CT imaging of the chest was performed using the standard protocol during bolus administration of intravenous contrast. Multiplanar CT image reconstructions and MIPs were obtained to evaluate the vascular anatomy.  CONTRAST:  OMNIPAQUE IOHEXOL 350 MG/ML SOLN  COMPARISON:  None.  FINDINGS: There is no large central pulmonary embolus. Segmental and subsegmental pulmonary arteries, specially to the lower lobes, or less reliably evaluated secondary to bolus timing in the large body habitus.  The no thoracic aortic aneurysm. No dissection of the thoracic aorta.  No axillary lymphadenopathy. No mediastinal or hilar lymphadenopathy. The heart size is upper normal. No pericardial or pleural effusion.  Lung windows show no pulmonary edema. No focal airspace consolidation. There is some minimal compressive atelectasis adjacent to the spine in the posterior medial right lower lobe.  Bone windows reveal no worrisome lytic or sclerotic osseous lesions.  Review of the MIP images confirms the above findings.  IMPRESSION: No CT evidence for acute pulmonary embolus. Segmental and subsegmental pulmonary arteries, especially to the lower lobes, may not be reliably evaluated secondary to bolus timing and body habitus.  No evidence for pulmonary edema or focal lung consolidation.   Electronically Signed   By: Kennith Center M.D.   On: 05/08/2013 16:09   Dg Knee Complete 4 Views Right  05/08/2013   CLINICAL DATA:  Anterior knee pain for 1 week.  EXAM: RIGHT KNEE - COMPLETE 4+ VIEW  COMPARISON:  None.  FINDINGS: There is no acute bony or joint abnormality. No joint effusion is seen. The patient has patellofemoral degenerative disease. Subchondral cyst is seen in the medial  patellar facet. Small osteophyte off the inferior pole of the patella is also identified.  IMPRESSION: No acute finding.  Patellofemoral degenerative disease.   Electronically Signed   By: Drusilla Kanner M.D.   On: 05/08/2013 14:48    EKG Interpretation    Date/Time:  Friday May 08 2013 14:19:07 EST Ventricular Rate:  86 PR Interval:  157 QRS Duration: 101 QT Interval:  377 QTC Calculation: 451 R Axis:   -9 Text Interpretation:  Sinus rhythm Normal ECG No previous tracing Confirmed by Denton Lank  MD, KEVIN ([id number]) on 05/08/2013 3:14:53 PM            MDM   Final diagnoses:  Peripheral edema  Patellofemoral arthralgia of right knee    [name] presents with shortness of breath, right leg pain and bilateral leg  swelling right greater than left for the last 3 days. Patient without history of DVT or PE however her concern for same today.  Patient pain greatest in her right knee. No known trauma or falls. Physical exam with peripheral, nonpitting edema, intact distal pulses no evidence of cellulitis.  Pt PERC negative, but obese and generally sedentary.    4:30PM Knee x-ray with evidence of patellofemoral degenerative disease.  I personally reviewed the imaging tests through PACS system.  I reviewed available ER/hospitalization records through the EMR.    5:34 PM Pt continues to rest comfortably.  Ct angio without evidence of PE.  She remains without hypoxia.    Vascular Ultrasound  Lower extremity venous duplex has been completed. Preliminary findings: no evidence of DVT bilaterally. A baker's cyst is noted on the right.  Baker's cyst is not able to be palpated on physical exam due to body habitus.  Other labs reassuring. Patient to be discharged home with followup primary care physician. Patient given orthopedic resource for further evaluation and options for treatment of her knee arthritis. Recommend primary care followup first before orthopedics.  It has been determined that no acute conditions requiring further emergency intervention are present at this time. The patient/guardian have been advised of the diagnosis and plan. We have discussed signs and symptoms that warrant return to the ED, such as changes or worsening in symptoms.   Vital signs are stable at discharge.   BP 114/92  Pulse 98  Temp(Src) 98.2 F (36.8 C) (Oral)  Resp 23  Ht 5\' 4"  (1.626 m)  Wt 343 lb (155.584 kg)  BMI 58.85 kg/m2  SpO2 97%  LMP 05/03/2013  Patient/guardian has voiced understanding and agreed to follow-up with the PCP or specialist.       Dierdre Forth, PA-C 05/08/13 1737

## 2013-05-08 NOTE — ED Notes (Signed)
Pt states she's had problems with her R knee for a while.  Pt states that for the last couple of days she's had increasing swelling to bilateral legs/feet, R>L.

## 2013-05-08 NOTE — Discharge Instructions (Signed)
1. Medications: ibuprofen, usual home medications 2. Treatment: rest, drink plenty of fluids,  3. Follow Up: Please followup with your primary doctor for discussion of your diagnoses and further evaluation after today's visit; if you do not have a primary care doctor use the resource guide provided to find one;     Peripheral Edema You have swelling in your legs (peripheral edema). This swelling is due to excess accumulation of salt and water in your body. Edema may be a sign of heart, kidney or liver disease, or a side effect of a medication. It may also be due to problems in the leg veins. Elevating your legs and using special support stockings may be very helpful, if the cause of the swelling is due to poor venous circulation. Avoid long periods of standing, whatever the cause. Treatment of edema depends on identifying the cause. Chips, pretzels, pickles and other salty foods should be avoided. Restricting salt in your diet is almost always needed. Water pills (diuretics) are often used to remove the excess salt and water from your body via urine. These medicines prevent the kidney from reabsorbing sodium. This increases urine flow. Diuretic treatment may also result in lowering of potassium levels in your body. Potassium supplements may be needed if you have to use diuretics daily. Daily weights can help you keep track of your progress in clearing your edema. You should call your caregiver for follow up care as recommended. SEEK IMMEDIATE MEDICAL CARE IF:   You have increased swelling, pain, redness, or heat in your legs.  You develop shortness of breath, especially when lying down.  You develop chest or abdominal pain, weakness, or fainting.  You have a fever. Document Released: 04/12/2004 Document Revised: 05/28/2011 Document Reviewed: 03/23/2009 [facility] Patient Information 2014 [place], [place].   Emergency Department Resource Guide 1) Find a Doctor and Pay Out of Pocket Although you  won't have to find out who is covered by your insurance plan, it is a good idea to ask around and get recommendations. You will then need to call the office and see if the doctor you have chosen will accept you as a new patient and what types of options they offer for patients who are self-pay. Some doctors offer discounts or will set up payment plans for their patients who do not have insurance, but you will need to ask so you aren't surprised when you get to your appointment.  2) Contact Your Local Health Department Not all health departments have doctors that can see patients for sick visits, but many do, so it is worth a call to see if yours does. If you don't know where your local health department is, you can check in your phone book. The CDC also has a tool to help you locate your state's health department, and many state websites also have listings of all of their local health departments.  3) Find a Walk-in Clinic If your illness is not likely to be very severe or complicated, you may want to try a walk in clinic. These are popping up all over the country in pharmacies, drugstores, and shopping centers. They're usually staffed by nurse practitioners or physician assistants that have been trained to treat common illnesses and complaints. They're usually fairly quick and inexpensive. However, if you have serious medical issues or chronic medical problems, these are probably not your best option.  No Primary Care Doctor: - Call Health Connect at  [phone] - they can help you locate a primary care doctor  that  accepts your insurance, provides certain services, etc. - Physician Referral Service- [phone]  Chronic Pain Problems: Organization         Address  Phone   Notes  [facility]  [phone] Patients need to be referred by their primary care doctor.   Medication Assistance: Organization         Address  Phone   Notes  [facility] [place] [facility] Medication [facility] [address] [place] [place], [place] [address] [phone] --Must be a resident of [facility] -- Must have NO insurance coverage whatsoever (no Medicaid/ Medicare, etc.) -- The pt. MUST have a primary care doctor that directs their care regularly and follows them in the community   [facility]ist  [phone]   [employer]  [phone]    Agencies that provide inexpensive medical care: Organization         Address  Phone   Notes  [name]  [phone]   [name]    [phone]   [facility] [address] [place], [place] [address] [phone]   [facility] [address] [place] [place] [phone]   Planned Parenthood    [phone]   [facility]    [phone]   [facility]  [address], [place] Phone:  [phone], Fax:  [phone] Hours of Operation:  9 am - 6 pm, M-F.  Also accepts Medicaid/Medicare and self-pay.  [facility]  [address], [place] Phone: [phone], Fax: [phone]. Hours of Operation:  8:30 am - 5:30 pm, M-F.  Also accepts Medicaid and self-pay.  [facility] [place] [address] [place] Phone: [phone]   [facility] [address], [place] [phone], Ext. [phone] Mondays & Thursdays: 7-9 AM.  First 15 patients are seen on a first come, first serve basis.    Medicaid-accepting [facility] Providers:  Organization         Address  Phone   Notes  [facility] [address], [place] [phone] Also accepts self-pay patients.  [facility] [address] Laurell Josephs [place], [place]  [phone]   [facility] [address], [place] [phone]   [facility] [address], [place] [phone]   Renaye Rakers [address], [place]   [phone] Only accepts [place] Access [place] patients after they have their name applied to their card.   Self-Pay (no insurance) in [facility]:  Organization         Address  Phone   Notes  Sickle Cell Patients, [facility] [address] [place], [place] [phone]   [facility] [address] [place], [place] [phone]   [name]  [address], [place] [phone]   [facility]/Dr. Osei-Bonsu  [address], [place] or [address], [place] [phone] Phone number for both [place] and [place] locations is the same.  [facility] [address], [place] [phone]   [facility] [address] [place] or [address] [phone]  [facility] [address], [place] [phone], phone; [phone], fax Sees patients 1st and 3rd Saturday of every month.  Must not qualify for public or private insurance (i.e. Medicaid, Medicare, [place] Health Choice, Veterans' Benefits)  Household income should be no more than 200% of the poverty level The clinic cannot treat you if you are pregnant or think you are pregnant  Sexually transmitted diseases are not treated at the clinic.    Dental Care: Organization         Address  Phone  Notes  [facility] [address] [place], [place] [phone] Accepts children up to age 57 who are enrolled in [place] or [place] Health Choice; pregnant women with a Medicaid card; and children who have applied for Medicaid or [place] Health Choice, but were declined, whose parents can pay a reduced fee at time of service.  [facility]  [address], [place] [phone] Accepts  children up to age 15 who are enrolled in [place] or [place] Health Choice; pregnant women with a Medicaid card; and children who have applied for Medicaid or [place] Health Choice, but were declined, whose parents can pay a reduced fee at time of service.  [facility]  [address] [place], [place] [phone] Patients are seen by appointment only. Walk-ins are not accepted. [facility] will see patients 31 years of age and older. Monday - Tuesday (8am-5pm) Most Wednesdays (8:30-5pm) $30 per visit, cash only  [facility] Adult [facility] Access [facility]  [address], [facility] [phone] Patients are seen by appointment only. Walk-ins are not accepted. [facility] will see patients 45 years of age and older. One Wednesday Evening (Monthly: Volunteer Based).  $30 per visit, cash only  [employer]  [phone] for adults; Children under age 53, call Graduate Pediatric Dentistry at [phone]. Children aged 67-14, please call [phone] to request a pediatric application.  Dental services are provided in all areas of dental care including fillings, crowns and bridges, complete and partial dentures, implants, gum treatment, root canals, and extractions. Preventive care is also provided. Treatment is provided to both adults and children. Patients are selected via a lottery and there is often a waiting list.   [facility] [address] [place]  [phone] [email or web address]   [facility] [address] [place], [place] [phone], Ext. [phone] Second and Fourth Thursday of each month, opens at 6:30 AM; Clinic ends at 9 AM.  Patients are seen on a first-come first-served basis, and a limited number are seen during each clinic.   [facility]  [address] Ether Griffins [place], [place] [phone]   Eligibility Requirements You must have lived in [place], [place], or [place] counties for at least the  last three months.   You cannot be eligible for state or federal sponsored [place], including [employer], [place], or [employer].   You generally cannot be eligible for healthcare insurance through your employer.    How to apply: Eligibility screenings are held every Tuesday and Wednesday afternoon from 1:00 pm until 4:00 pm. You do not need an appointment for the interview!  [facility] [address], [place], [place] [phone]   [facility] Health Department  [phone]   [facility] Health Department  [phone]   [facility]  Department  [phone]    Behavioral Health Resources in the Community: Intensive Outpatient Programs Organization         Address  Phone  Notes  [facility] [address] [place], [place] [phone]   [facility] [address], [place], [place] [phone]   [facility] [address] [place], [place]  [phone]   [facility] [address]  [place], [place] [phone] or [phone]   Substance Abuse Resources Organization         Address  Phone  Notes  [facility]  [phone]   [facility]  [phone]   [facility]  [phone]   Floydene Flock  [phone]   Residential & Outpatient Substance Abuse Program  [phone]   Psychological Services Organization         Address  Phone  Notes  [facility]  [phone]   [facility] Services  [phone]   [facility] [address] [place] [phone] or [phone]    Mobile Crisis Teams Organization         Address  Phone  Notes  [facility], Mobile Crisis Care Unit  [phone]   [facility]  [address] [place], [place] [phone]   Doristine Locks [address] [place] [phone]     Self-Help/Support Groups Organization         Address  Phone             Notes  [facility] [place] - variety of support groups  336- I7437963 Call for more information  Narcotics Anonymous (NA), [facility] [address], [employer] [place]  2 meetings at this location   Statistician         Address  Phone  Notes  [facility] [address],    [place]  [phone]   [facility] [place]  [address] [place] [address], [place], [place] [phone]   [facility] [address] [place], [place] [phone] Admissions: 8am-3pm M-F  [facility] [address],    [place], [place] [phone]   [facility] [address] [place], [place], [place] [phone]   [facility] [address],  [place], [place] [phone]   [facility] - Intensive Outpatient [address] Laurell Josephs [address], [place], [place] [phone]   [facility] ([facility] [address] [place]  [place], [place] [phone] or [phone]   [facility] ([facility]) [address], [place], [place] [phone] Accepts Medicaid  [facility] [place] [address],  [place] [phone] Substance Abuse/Addiction Treatment   [facility] Organization         Address  Phone  Notes  [facility]  [phone]   Angie Fava, PhD [address] Ervin Knack [place], [place]   [phone])[phone] or 7784307552   [facility] Behavioral   [address] [place], [place] [phone]   [facility] [address] [place], [place] [phone] Insurance/Medicaid/sponsorship through [employer] and Families [address]                                    [place], [place] [phone] Therapy/tele-psych/case  [facility]  [address]   [place], [place] [phone](336) [phone]    Dr. Lolly MustacheArfeen  [phone]) [phone]   [facility]  [facility] [place]  [employer] [facility] 1) [address], [place] 2) [address], [place] 3)  [address], [place] [phone]) [phone]) [phone]  [phone]) [phone]   [facility] Child Abuse Hotline [phone](336) [phone] or [phone](336) [phone] (After Hours)

## 2013-05-09 NOTE — ED Provider Notes (Signed)
Medical screening examination/treatment/procedure(s) were conducted as a shared visit with non-physician practitioner(s) and myself.  I personally evaluated the patient during the encounter.  EKG Interpretation    Date/Time:  Friday May 08 2013 14:19:07 EST Ventricular Rate:  86 PR Interval:  157 QRS Duration: 101 QT Interval:  377 QTC Calculation: 451 R Axis:   -9 Text Interpretation:  Sinus rhythm Normal ECG No previous tracing Confirmed by Ebonee Stober  MD, Ylonda Storr ([id number]) on 05/08/2013 3:14:53 PM            Pt c/o bil lower leg swelling. No cp or sob. Afeb. Symmetric leg edema. Distal pulses palp. No cellulitis. Labs.  Suzi RootsKevin E Kenechukwu Eckstein, MD 05/09/13 [phone]

## 2013-07-11 ENCOUNTER — Emergency Department (HOSPITAL_COMMUNITY)
Admission: EM | Admit: 2013-07-11 | Discharge: 2013-07-11 | Disposition: A | Discharge status: Home / Self Care | Payer: Self-pay | Attending: Emergency Medicine | Admitting: Emergency Medicine

## 2013-07-11 ENCOUNTER — Encounter (HOSPITAL_COMMUNITY): Payer: Self-pay | Admitting: Emergency Medicine

## 2013-07-11 DIAGNOSIS — R296 Repeated falls: Secondary | ICD-10-CM | POA: Insufficient documentation

## 2013-07-11 DIAGNOSIS — M549 Dorsalgia, unspecified: Secondary | ICD-10-CM

## 2013-07-11 DIAGNOSIS — M791 Myalgia, unspecified site: Secondary | ICD-10-CM

## 2013-07-11 DIAGNOSIS — Y9289 Other specified places as the place of occurrence of the external cause: Secondary | ICD-10-CM | POA: Insufficient documentation

## 2013-07-11 DIAGNOSIS — S99919A Unspecified injury of unspecified ankle, initial encounter: Secondary | ICD-10-CM

## 2013-07-11 DIAGNOSIS — S8990XA Unspecified injury of unspecified lower leg, initial encounter: Secondary | ICD-10-CM | POA: Insufficient documentation

## 2013-07-11 DIAGNOSIS — Z9889 Other specified postprocedural states: Secondary | ICD-10-CM | POA: Insufficient documentation

## 2013-07-11 DIAGNOSIS — S99929A Unspecified injury of unspecified foot, initial encounter: Secondary | ICD-10-CM

## 2013-07-11 DIAGNOSIS — Z791 Long term (current) use of non-steroidal anti-inflammatories (NSAID): Secondary | ICD-10-CM | POA: Insufficient documentation

## 2013-07-11 DIAGNOSIS — Z87448 Personal history of other diseases of urinary system: Secondary | ICD-10-CM | POA: Insufficient documentation

## 2013-07-11 DIAGNOSIS — IMO0002 Reserved for concepts with insufficient information to code with codable children: Secondary | ICD-10-CM | POA: Insufficient documentation

## 2013-07-11 DIAGNOSIS — Y9389 Activity, other specified: Secondary | ICD-10-CM | POA: Insufficient documentation

## 2013-07-11 MED ORDER — OXYCODONE-ACETAMINOPHEN 5-325 MG PO TABS: 1.0000 | ORAL_TABLET | ORAL | Status: DC | PRN

## 2013-07-11 MED ORDER — NAPROXEN 500 MG PO TABS: 500.0000 mg | ORAL_TABLET | Freq: Two times a day (BID) | ORAL | Status: DC

## 2013-07-11 NOTE — ED Notes (Signed)
Pt comfortable with discharge and follow up instructions. Prescriptions x2. 

## 2013-07-11 NOTE — ED Notes (Signed)
Pt. Stated, i fell a week ago and now Im having back, leg pain . I've taken Advil and it doesn't help the pain

## 2013-07-11 NOTE — Discharge Instructions (Signed)
SEEK IMMEDIATE MEDICAL ATTENTION IF: °New numbness, tingling, weakness, or problem with the use of your arms or legs.  °Severe back pain not relieved with medications.  °Change in bowel or bladder control.  °Increasing pain in any areas of the body (such as chest or abdominal pain).  °Shortness of breath, dizziness or fainting.  °Nausea (feeling sick to your stomach), vomiting, fever, or sweats. ° ° °Back Pain, Adult °Low back pain is very common. About 1 in 5 people have back pain. The cause of low back pain is rarely dangerous. The pain often gets better over time. About half of people with a sudden onset of back pain feel better in just 2 weeks. About 8 in 10 people feel better by 6 weeks.  °CAUSES °Some common causes of back pain include: °· Strain of the muscles or ligaments supporting the spine. °· Wear and tear (degeneration) of the spinal discs. °· Arthritis. °· Direct injury to the back. °DIAGNOSIS °Most of the time, the direct cause of low back pain is not known. However, back pain can be treated effectively even when the exact cause of the pain is unknown. Answering your caregiver's questions about your overall health and symptoms is one of the most accurate ways to make sure the cause of your pain is not dangerous. If your caregiver needs more information, he or she may order lab work or imaging tests (X-rays or MRIs). However, even if imaging tests show changes in your back, this usually does not require surgery. °HOME CARE INSTRUCTIONS °For many people, back pain returns. Since low back pain is rarely dangerous, it is often a condition that people can learn to manage on their own.  °· Remain active. It is stressful on the back to sit or stand in one place. Do not sit, drive, or stand in one place for more than 30 minutes at a time. Take short walks on level surfaces as soon as pain allows. Try to increase the length of time you walk each day. °· Do not stay in bed. Resting more than 1 or 2 days can  delay your recovery. °· Do not avoid exercise or work. Your body is made to move. It is not dangerous to be active, even though your back may hurt. Your back will likely heal faster if you return to being active before your pain is gone. °· Pay attention to your body when you  bend and lift. Many people have less discomfort when lifting if they bend their knees, keep the load close to their bodies, and avoid twisting. Often, the most comfortable positions are those that put less stress on your recovering back. °· Find a comfortable position to sleep. Use a firm mattress and lie on your side with your knees slightly bent. If you lie on your back, put a pillow under your knees. °· Only take over-the-counter or prescription medicines as directed by your caregiver. Over-the-counter medicines to reduce pain and inflammation are often the most helpful. Your caregiver may prescribe muscle relaxant drugs. These medicines help dull your pain so you can more quickly return to your normal activities and healthy exercise. °· Put ice on the injured area. °· Put ice in a plastic bag. °· Place a towel between your skin and the bag. °· Leave the ice on for 15-20 minutes, 03-04 times a day for the first 2 to 3 days. After that, ice and heat may be alternated to reduce pain and spasms. °· Ask your caregiver about trying back exercises and gentle massage. This may be of some benefit. °· Avoid feeling anxious or   stressed. Stress increases muscle tension and can worsen back pain. It is important to recognize when you are anxious or stressed and learn ways to manage it. Exercise is a great option. °SEEK MEDICAL CARE IF: °· You have pain that is not relieved with rest or medicine. °· You have pain that does not improve in 1 week. °· You have new symptoms. °· You are generally not feeling well. °SEEK IMMEDIATE MEDICAL CARE IF:  °· You have pain that radiates from your back into your legs. °· You develop new bowel or bladder control  problems. °· You have unusual weakness or numbness in your arms or legs. °· You develop nausea or vomiting. °· You develop abdominal pain. °· You feel faint. °Document Released: 03/05/2005 Document Revised: 09/04/2011 Document Reviewed: 07/24/2010 °[employer]® Patient Information ©2014 [employer]. °Acetaminophen; Oxycodone tablets °What is this medicine? °ACETAMINOPHEN; OXYCODONE (a set a MEE noe fen; ox i KOE done) is a pain reliever. It is used to treat mild to moderate pain. °This medicine may be used for other purposes; ask your health care provider or pharmacist if you have questions. °COMMON BRAND NAME(S): Endocet, Magnacet, Narvox, Percocet, Perloxx, Primalev, Primlev, Roxicet, Xolox °What should I tell my health care provider before I take this medicine? °They need to know if you have any of these conditions: °-brain tumor °-Crohn's disease, inflammatory bowel disease, or ulcerative colitis °-drug abuse or addiction °-head injury °-heart or circulation problems °-if you often drink alcohol °-kidney disease or problems going to the bathroom °-liver disease °-lung disease, asthma, or breathing problems °-an unusual or allergic reaction to acetaminophen, oxycodone, other opioid analgesics, other medicines, foods, dyes, or preservatives °-pregnant or trying to get pregnant °-breast-feeding °How should I use this medicine? °Take this medicine by mouth with a full glass of water. Follow the directions on the prescription label. Take your medicine at regular intervals. Do not take your medicine more often than directed. °Talk to your pediatrician regarding the use of this medicine in children. Special care may be needed. °Patients over 65 years old may have a stronger reaction and need a smaller dose. °Overdosage: If you think you have taken too much of this medicine contact a poison control center or emergency room at once. °NOTE: This medicine is only for you. Do not share this medicine with others. °What if I  miss a dose? °If you miss a dose, take it as soon as you can. If it is almost time for your next dose, take only that dose. Do not take double or extra doses. °What may interact with this medicine? °-alcohol °-antihistamines °-barbiturates like amobarbital, butalbital, butabarbital, methohexital, pentobarbital, phenobarbital, thiopental, and secobarbital °-benztropine °-drugs for bladder problems like solifenacin, trospium, oxybutynin, tolterodine, hyoscyamine, and methscopolamine °-drugs for breathing problems like ipratropium and tiotropium °-drugs for certain stomach or intestine problems like propantheline, homatropine methylbromide, glycopyrrolate, atropine, belladonna, and dicyclomine °-general anesthetics like etomidate, ketamine, nitrous oxide, propofol, desflurane, enflurane, halothane, isoflurane, and sevoflurane °-medicines for depression, anxiety, or psychotic disturbances °-medicines for sleep °-muscle relaxants °-naltrexone °-narcotic medicines (opiates) for pain °-phenothiazines like perphenazine, thioridazine, chlorpromazine, mesoridazine, fluphenazine, prochlorperazine, promazine, and trifluoperazine °-scopolamine °-tramadol °-trihexyphenidyl °This list may not describe all possible interactions. Give your health care provider a list of all the medicines, herbs, non-prescription drugs, or dietary supplements you use. Also tell them if you smoke, drink alcohol, or use illegal drugs. Some items may interact with your medicine. °What should I watch for while using this medicine? °Tell your doctor or health care professional   if your pain does not go away, if it gets worse, or if you have new or a different type of pain. You may develop tolerance to the medicine. Tolerance means that you will need a higher dose of the medication for pain relief. Tolerance is normal and is expected if you take this medicine for a long time. °Do not suddenly stop taking your medicine because you may develop a severe  reaction. Your body becomes used to the medicine. This does NOT mean you are addicted. Addiction is a behavior related to getting and using a drug for a non-medical reason. If you have pain, you have a medical reason to take pain medicine. Your doctor will tell you how much medicine to take. If your doctor wants you to stop the medicine, the dose will be slowly lowered over time to avoid any side effects. °You may get drowsy or dizzy. Do not drive, use machinery, or do anything that needs mental alertness until you know how this medicine affects you. Do not stand or sit up quickly, especially if you are an older patient. This reduces the risk of dizzy or fainting spells. Alcohol may interfere with the effect of this medicine. Avoid alcoholic drinks. °There are different types of narcotic medicines (opiates) for pain. If you take more than one type at the same time, you may have more side effects. Give your health care provider a list of all medicines you use. Your doctor will tell you how much medicine to take. Do not take more medicine than directed. Call emergency for help if you have problems breathing. °The medicine will cause constipation. Try to have a bowel movement at least every 2 to 3 days. If you do not have a bowel movement for 3 days, call your doctor or health care professional. °Do not take Tylenol (acetaminophen) or medicines that have acetaminophen with this medicine. Too much acetaminophen can be very dangerous. Many nonprescription medicines contain acetaminophen. Always read the labels carefully to avoid taking more acetaminophen. °What side effects may I notice from receiving this medicine? °Side effects that you should report to your doctor or health care professional as soon as possible: °-allergic reactions like skin rash, itching or hives, swelling of the face, lips, or tongue °-breathing difficulties, wheezing °-confusion °-light headedness or fainting spells °-severe stomach  pain °-unusually weak or tired °-yellowing of the skin or the whites of the eyes  °Side effects that usually do not require medical attention (report to your doctor or health care professional if they continue or are bothersome): °-dizziness °-drowsiness °-nausea °-vomiting °This list may not describe all possible side effects. Call your doctor for medical advice about side effects. You may report side effects to FDA at 1-800-FDA-1088. °Where should I keep my medicine? °Keep out of the reach of children. This medicine can be abused. Keep your medicine in a safe place to protect it from theft. Do not share this medicine with anyone. Selling or giving away this medicine is dangerous and against the law. °Store at room temperature between 20 and 25 degrees C (68 and 77 degrees F). Keep container tightly closed. Protect from light. °This medicine may cause accidental overdose and death if it is taken by other adults, children, or pets. Flush any unused medicine down the toilet to reduce the chance of harm. Do not use the medicine after the expiration date. °NOTE: This sheet is a summary. It may not cover all possible information. If you have questions about this medicine,   talk to your doctor, pharmacist, or health care provider. °© 2014, [employer]. (2012-10-27 13:17:35) ° ° °

## 2013-07-11 NOTE — ED Provider Notes (Signed)
CSN: [id number]     Arrival date & time 07/11/13  1150 History  This chart was scribed for non-physician practitioner, Arthor CaptainAbigail Zariana Strub, PA-C, working with Rolland PorterMark James, MD by Shari HeritageAisha Amuda, ED Scribe. This patient was seen in room TR08C/TR08C and the patient's care was started at 2:09 PM.  Chief Complaint  Patient presents with  . Back Pain  . Leg Pain  . Fall    The history is provided by the patient. No language interpreter was used.    HPI Comments: Rita Bass is a 48 y.o. female who presents to the Emergency Department complaining of fall that occurred last week. Patient states that she did a split and then fell onto her back. She is complaining of lower back pain that shoots posteriorly down both legs, hip flexor pain and bilateral knee pain. She has noticed pain is worse while she is driving, with long period of sitting, and with ambulation. She has been taking Advil without pain relief. She denies fever, chills, rash, bowel incontinence, bladder incontinence, hematuria, blood in stool, dysuria, or numbness or weakness of the extremities. She has a history of renal stent surgery x2.    Past Medical History  Diagnosis Date  . Renal disorder    Past Surgical History  Procedure Laterality Date  . Oophorectomy    . Cesarean section    . Cholecystectomy    . Hernia repair     No family history on file. History  Substance Use Topics  . Smoking status: Never Smoker   . Smokeless tobacco: Not on file  . Alcohol Use: No   OB History   Grav Para Term Preterm Abortions TAB SAB Ect Mult Living                 Review of Systems  Constitutional: Negative for fever and chills.  Gastrointestinal: Negative for blood in stool.       Negative for bowel incontinence.  Genitourinary: Negative for dysuria and hematuria.       Negative for bladder incontinence.  Musculoskeletal: Positive for arthralgias, back pain and myalgias.  Skin: Negative for rash.  =  Allergies  Review of  patient's allergies indicates no known allergies.  Home Medications   Prior to Admission medications   Medication Sig Start Date End Date Taking? Authorizing Provider  ibuprofen (ADVIL,MOTRIN) 800 MG tablet Take 1 tablet (800 mg total) by mouth every 8 (eight) hours as needed for moderate pain. With food 05/08/13   Dahlia ClientHannah Muthersbaugh, PA-C   Triage Vitals: BP 144/93  Pulse 115  Temp(Src) 97.5 F (36.4 C) (Oral)  Resp 20  SpO2 94%  LMP 07/04/2013 Physical Exam  Constitutional: She is oriented to person, place, and time. She appears well-developed and well-nourished.  HENT:  Head: Normocephalic and atraumatic.  Eyes: Conjunctivae and EOM are normal.  Neck: Normal range of motion. Neck supple.  Cardiovascular: Normal rate.   Pulmonary/Chest: Effort normal.  Musculoskeletal: She exhibits tenderness.  Tenderness to palpation in musculature of legs, lower back and buttocks. No midline spinal tenderness.  Neurological: She is alert and oriented to person, place, and time. Coordination and gait normal.  Skin: Skin is warm and dry.  Psychiatric: She has a normal mood and affect. Her behavior is normal.    ED Course  Procedures (including critical care time) DIAGNOSTIC STUDIES: Oxygen Saturation is 94% on room air, adequate by my interpretation.    COORDINATION OF CARE: 2:14 PM- Patient informed of current plan for treatment and evaluation  and agrees with plan at this time.    MDM   Final diagnoses:  Back pain  Myalgia    Patient with back pain.  No neurological deficits and normal neuro exam.  Patient can walk but states is painful.  No loss of bowel or bladder control.  No concern for cauda equina.  No fever, night sweats, weight loss, h/o cancer, IVDU.  RICE protocol and pain medicine indicated and discussed with patient.    I personally performed the services described in this documentation, which was scribed in my presence. The recorded information has been reviewed and is  accurate.      Arthor CaptainAbigail Teriana Danker, PA-C 07/12/13 1851

## 2013-07-20 NOTE — ED Provider Notes (Signed)
Medical screening examination/treatment/procedure(s) were performed by non-physician practitioner and as supervising physician I was immediately available for consultation/collaboration.   EKG Interpretation None        Rolland PorterMark Anaiyah Anglemyer, MD 07/20/13 2206

## 2013-10-06 ENCOUNTER — Encounter (HOSPITAL_COMMUNITY): Payer: Self-pay | Admitting: Emergency Medicine

## 2013-10-06 ENCOUNTER — Emergency Department (HOSPITAL_COMMUNITY)
Admission: EM | Admit: 2013-10-06 | Discharge: 2013-10-06 | Disposition: A | Discharge status: Home / Self Care | Payer: Self-pay | Attending: Emergency Medicine | Admitting: Emergency Medicine

## 2013-10-06 ENCOUNTER — Emergency Department (HOSPITAL_COMMUNITY): Payer: Self-pay

## 2013-10-06 DIAGNOSIS — R079 Chest pain, unspecified: Secondary | ICD-10-CM | POA: Insufficient documentation

## 2013-10-06 DIAGNOSIS — N189 Chronic kidney disease, unspecified: Secondary | ICD-10-CM | POA: Insufficient documentation

## 2013-10-06 DIAGNOSIS — R0602 Shortness of breath: Secondary | ICD-10-CM

## 2013-10-06 DIAGNOSIS — Z791 Long term (current) use of non-steroidal anti-inflammatories (NSAID): Secondary | ICD-10-CM | POA: Insufficient documentation

## 2013-10-06 DIAGNOSIS — R0609 Other forms of dyspnea: Secondary | ICD-10-CM | POA: Insufficient documentation

## 2013-10-06 DIAGNOSIS — R0989 Other specified symptoms and signs involving the circulatory and respiratory systems: Secondary | ICD-10-CM | POA: Insufficient documentation

## 2013-10-06 DIAGNOSIS — M7989 Other specified soft tissue disorders: Secondary | ICD-10-CM

## 2013-10-06 DIAGNOSIS — R519 Headache, unspecified: Secondary | ICD-10-CM

## 2013-10-06 DIAGNOSIS — R51 Headache: Secondary | ICD-10-CM | POA: Insufficient documentation

## 2013-10-06 DIAGNOSIS — R0600 Dyspnea, unspecified: Secondary | ICD-10-CM

## 2013-10-06 DIAGNOSIS — I809 Phlebitis and thrombophlebitis of unspecified site: Secondary | ICD-10-CM | POA: Insufficient documentation

## 2013-10-06 HISTORY — DX: Obesity, unspecified: E66.9

## 2013-10-06 LAB — BASIC METABOLIC PANEL
ANION GAP: 14 (ref 5–15)
BUN: 8 mg/dL (ref 6–23)
CALCIUM: 9.1 mg/dL (ref 8.4–10.5)
CHLORIDE: 98 meq/L (ref 96–112)
CO2: 27 mEq/L (ref 19–32)
CREATININE: 0.58 mg/dL (ref 0.50–1.10)
Glucose, Bld: 105 mg/dL — ABNORMAL HIGH (ref 70–99)
Potassium: 3.9 mEq/L (ref 3.7–5.3)
Sodium: 139 mEq/L (ref 137–147)

## 2013-10-06 LAB — CBC WITH DIFFERENTIAL/PLATELET
BASOS ABS: 0 10*3/uL (ref 0.0–0.1)
Basophils Relative: 0 % (ref 0–1)
EOS ABS: 0.4 10*3/uL (ref 0.0–0.7)
Eosinophils Relative: 4 % (ref 0–5)
HCT: 35.5 % — ABNORMAL LOW (ref 36.0–46.0)
Hemoglobin: 10.6 g/dL — ABNORMAL LOW (ref 12.0–15.0)
LYMPHS ABS: 2.2 10*3/uL (ref 0.7–4.0)
Lymphocytes Relative: 23 % (ref 12–46)
MCH: 20.7 pg — ABNORMAL LOW (ref 26.0–34.0)
MCHC: 29.9 g/dL — AB (ref 30.0–36.0)
MCV: 69.3 fL — AB (ref 78.0–100.0)
MONO ABS: 0.6 10*3/uL (ref 0.1–1.0)
Monocytes Relative: 6 % (ref 3–12)
Neutro Abs: 6.4 10*3/uL (ref 1.7–7.7)
Neutrophils Relative %: 67 % (ref 43–77)
PLATELETS: 377 10*3/uL (ref 150–400)
RBC: 5.12 MIL/uL — ABNORMAL HIGH (ref 3.87–5.11)
RDW: 17.6 % — AB (ref 11.5–15.5)
WBC: 9.6 10*3/uL (ref 4.0–10.5)

## 2013-10-06 LAB — URINALYSIS, ROUTINE W REFLEX MICROSCOPIC
Bilirubin Urine: NEGATIVE
Glucose, UA: NEGATIVE mg/dL
Hgb urine dipstick: NEGATIVE
KETONES UR: NEGATIVE mg/dL
Leukocytes, UA: NEGATIVE
NITRITE: NEGATIVE
PROTEIN: NEGATIVE mg/dL
Specific Gravity, Urine: 1.015 (ref 1.005–1.030)
UROBILINOGEN UA: 0.2 mg/dL (ref 0.0–1.0)
pH: 5.5 (ref 5.0–8.0)

## 2013-10-06 LAB — D-DIMER, QUANTITATIVE: D-Dimer, Quant: 0.68 ug/mL-FEU — ABNORMAL HIGH (ref 0.00–0.48)

## 2013-10-06 MED ORDER — IBUPROFEN 400 MG PO TABS
600.0000 mg | ORAL_TABLET | Freq: Once | ORAL | Status: AC
  Administered 2013-10-06: 600 mg via ORAL
  Filled 2013-10-06 (×2): qty 1

## 2013-10-06 MED ORDER — TRAMADOL HCL 50 MG PO TABS: 50.0000 mg | ORAL_TABLET | Freq: Four times a day (QID) | ORAL | Status: DC | PRN

## 2013-10-06 MED ORDER — ASPIRIN 81 MG PO CHEW
162.0000 mg | CHEWABLE_TABLET | Freq: Once | ORAL | Status: AC
  Administered 2013-10-06: 162 mg via ORAL
  Filled 2013-10-06: qty 2

## 2013-10-06 NOTE — Discharge Instructions (Signed)
We saw you in the ER for the headache and the leg pain and shortness of breath. All the results in the ER are normal, labs and imaging. No deep clots seen - but there appears to be some varicose vein and superficial thrombophlebitis. Treatment is ice and heat packs and motrin.  The workup in the ER is not complete, and is limited to screening for life threatening and emergent conditions only, so please see a primary care doctor for further evaluation.  WE WANT YOU TO RETURN TO THE ER IF THE BREATHING PROBLEMS GETS WORSE.   General Headache Without Cause A general headache is pain or discomfort felt around the head or neck area. The cause may not be found.  HOME CARE   Keep all doctor visits.  Only take medicines as told by your doctor.  Lie down in a dark, quiet room when you have a headache.  Keep a journal to find out if certain things bring on headaches. For example, write down:  What you eat and drink.  How much sleep you get.  Any change to your diet or medicines.  Relax by getting a massage or doing other relaxing activities.  Put ice or heat packs on the head and neck area as told by your doctor.  Lessen stress.  Sit up straight. Do not tighten (tense) your muscles.  Quit smoking if you smoke.  Lessen how much alcohol you drink.  Lessen how much caffeine you drink, or stop drinking caffeine.  Eat and sleep on a regular schedule.  Get 7 to 9 hours of sleep, or as told by your doctor.  Keep lights dim if bright lights bother you or make your headaches worse. GET HELP RIGHT AWAY IF:   Your headache becomes really bad.  You have a fever.  You have a stiff neck.  You have trouble seeing.  Your muscles are weak, or you lose muscle control.  You lose your balance or have trouble walking.  You feel like you will pass out (faint), or you pass out.  You have really bad symptoms that are different than your first symptoms.  You have problems with the  medicines given to you by your doctor.  Your medicines do not work.  Your headache feels different than the other headaches.  You feel sick to your stomach (nauseous) or throw up (vomit). MAKE SURE YOU:   Understand these instructions.  Will watch your condition.  Will get help right away if you are not doing well or get worse. Document Released: 12/13/2007 Document Revised: 05/28/2011 Document Reviewed: 02/23/2011 [facility] Patient Information 2015 [place], [place]. This information is not intended to replace advice given to you by your health care provider. Make sure you discuss any questions you have with your health care provider. Phlebitis Phlebitis is soreness and swelling (inflammation) of a vein. This can occur in your arms, legs, or torso (trunk), as well as deeper inside your body. Phlebitis is usually not serious when it occurs close to the surface of the body. However, it can cause serious problems when it occurs in a vein deeper inside the body. CAUSES  Phlebitis can be triggered by various things, including:   Reduced blood flow through your veins. This can happen with:  Bed rest over a long period.  Long-distance travel.  Injury.  Surgery.  Being overweight (obese) or pregnant.  Having an IV tube put in the vein and getting certain medicines through the vein.  Cancer and cancer treatment.  Use of illegal drugs taken through the vein.  Inflammatory diseases.  Inherited (genetic) diseases that increase the risk of blood clots.  Hormone therapy, such as birth control pills. SIGNS AND SYMPTOMS   Red, tender, swollen, and painful area on your skin. Usually, the area will be long and narrow.  Firmness along the center of the affected area. This can indicate that a blood clot has formed.  Low-grade fever. DIAGNOSIS  A health care provider can usually diagnose phlebitis by examining the affected area and asking about your symptoms. To check for infection or  blood clots, your health care provider may order blood tests or an ultrasound exam of the area. Blood tests and your family history may also indicate if you have an underlying genetic disease that causes blood clots. Occasionally, a piece of tissue is taken from the body (biopsy sample) if an unusual cause of phlebitis is suspected. TREATMENT  Treatment will vary depending on the severity of the condition and the area of the body affected. Treatment may include:  Use of a warm compress or heating pad.  Use of compression stockings or bandages.  Anti-inflammatory medicines.  Removal of any IV tube that may be causing the problem.  Medicines that kill germs (antibiotics) if an infection is present.  Blood-thinning medicines if a blood clot is suspected or present.  In rare cases, surgery may be needed to remove damaged sections of vein. HOME CARE INSTRUCTIONS   Only take over-the-counter or prescription medicines as directed by your health care provider. Take all medicines exactly as prescribed.  Raise (elevate) the affected area above the level of your heart as directed by your health care provider.  Apply a warm compress or heating pad to the affected area as directed by your health care provider. Do not sleep with the heating pad.  Use compression stockings or bandages as directed. These will speed healing and prevent the condition from coming back.  If you are on blood thinners:  Get follow-up blood tests as directed by your health care provider.  Check with your health care provider before using any new medicines.  Carry a medical alert card or wear your medical alert jewelry to show that you are on blood thinners.  For phlebitis in the legs:  Avoid prolonged standing or bed rest.  Keep your legs moving. Raise your legs when sitting or lying.  Do not smoke.  Women, particularly those over the age of 69, should consider the risks and benefits of taking the contraceptive  pill. This kind of hormone treatment can increase your risk for blood clots.  Follow up with your health care provider as directed. SEEK MEDICAL CARE IF:   You have unusual bruising or any bleeding problems.  Your swelling or pain in the affected area is not improving.  You are on anti-inflammatory medicine, and you develop belly (abdominal) pain. SEEK IMMEDIATE MEDICAL CARE IF:   You have a sudden onset of chest pain or difficulty breathing.  You have a fever or persistent symptoms for more than 2-3 days.  You have a fever and your symptoms suddenly get worse. MAKE SURE YOU:  Understand these instructions.  Will watch your condition.  Will get help right away if you are not doing well or get worse. Document Released: 02/27/2001 Document Revised: 12/24/2012 Document Reviewed: 11/10/2012 [facility] Patient Information 2015 [place], [place]. This information is not intended to replace advice given to you by your health care provider. Make sure you discuss any questions  you have with your health care provider.  RESOURCE GUIDE  Chronic Pain Problems: Contact [facility]  [phone] Patients need to be referred by their primary care doctor.  Insufficient Money for Medicine: Contact [employer]:  call "211."   No Primary Care Doctor: - Call Health Connect  [phone] - can help you locate a primary care doctor that  accepts your insurance, provides certain services, etc. - Physician Referral Service- [phone]  Agencies that provide inexpensive medical care: - [name]  [phone] - [name]  [phone] - [facility]  [phone] - Women's Clinic  [phone] - Planned Parenthood  [phone] [facility]  [phone]  Medicaid-accepting [facility] Providers: - [facility]- [address]  [phone], Mon-Fri 9am-7pm, Sat 9am-1pm - [facility]- [address]  [place], [address] [place]  [phone] - [facility]- [address], [address] [place]  [phone] [facility]- [address]  [phone] - Renaye Rakers- [address], [address], [phone]  Only accepts [place] patients after they have their name  applied to their card  Self Pay (no insurance) in [place]: - Sickle Cell Patients: Dr Willey Blade, [facility]  [address] [place], [phone] - [facility]- [address] [place]  [phone]       [name] [place]- [address]       -     [facility]- see information above (Speak to [employer] if you do not have insurance)       -  [facility]- [address],  [phone]       -  [facility]- [address], [phone]       -  Dr Julio Sicks-  [address], [place], [phone]       -  [facility] - [address], [phone]       -  [facility]- [address] [phone], also [address] [phone]       -    [facility]- [address] [place], [phone], 1st & 3rd Saturday        every month, 10am-1pm  [facility] [address] [place], [place] [address] [phone]  [facility] [address] [place], [place] [address] [phone]  1) Find a Doctor and Pay Out of Pocket Although you won't have to find out who is covered by your insurance plan, it is a good idea to ask around and get recommendations. You will then need to call the office and see if the doctor you have chosen will accept you as a new patient and what types of options they offer for patients who are self-pay. Some doctors offer discounts or will set up payment plans for their patients who do not have insurance, but you will need to ask so you aren't surprised when you get to your appointment.  2) Contact Your  Local Health Department Not all health departments have doctors that can see patients for sick visits, but many do, so it is worth a call to see if yours does. If you don't know where your local health department is, you can check in  your phone book. The CDC also has a tool to help you locate your state's health department, and many state websites also have listings of all of their local health departments.  3) Find a Walk-in Clinic If your illness is not likely to be very severe or complicated, you may want to try a walk in clinic. These are popping up all over the country in pharmacies, drugstores, and shopping centers. They're usually staffed by nurse practitioners or physician assistants that have been trained to treat common illnesses and complaints. They're usually fairly quick and inexpensive. However, if you have serious medical issues or chronic medical problems, these are probably not your best option  STD Testing - [facility] [place], STD Clinic, [address], [place], phone [phone] or [phone].  Monday - Friday, call for an appointment. [facility]- [place] [facility] [place], STD Clinic, [address], [place], phone [phone] or [phone].  Monday - Friday, call for an appointment.  Abuse/Neglect: [facility]- [place] Child Abuse Hotline [phone] [facility]- [place] Child Abuse Hotline [phone] (After Hours)  Emergency Shelter:  [facility] [phone]  Maternity Homes: - Room at the [facility] [facility] [phone] - [facility] [phone]  MRSA Hotline #:   (207)264-4218(450)867-5311  Dental Assistance If unable to pay or uninsured, contact:  [facility] to become qualified for the adult dental clinic.  Patients with Medicaid: [facility] [phone], [phone] [address], [phone]  If unable to pay, or  uninsured, contact [facility] [phone] in [place], [phone] in [facility]) to become qualified for the adult dental clinic  [facility] [address] [place], [place] [address] [phone] [email or web address]  Other [facility]Cost Community Dental Services: - [facility]- [address], [place], [place], [address], [phone], [phone], 2nd and 4th Thursday of the month at 6:30am.  10 clients each day by appointment, can sometimes see walk-in patients if someone does not show for an appointment. [facility]- [address], [place], [place], [address], [phone] - [facility]- [address], [place], [place], [address], [phone] - [facility]- [phone] 270 30[address] [facility]- [facility]- [phone] [facility]- [facility] [facility]- [phone]

## 2013-10-06 NOTE — Discharge Planning (Signed)
[facility] Community [employer]Liaison  Spoke with patient regarding primary care resources and the [facility] orange card. Patient was given the orange card application and instructions on how to complete and turn in the application. Resource guide and my contact information also provided for any future questions or concerns. No other Community Liaison needs identified at this time.

## 2013-10-06 NOTE — ED Notes (Signed)
Checked pt's pulse ox while ambulating. Pt's spO2 level stayed at 97%.

## 2013-10-06 NOTE — ED Provider Notes (Addendum)
CSN: [id number]     Arrival date & time 10/06/13  1104 History   First MD Initiated Contact with Patient 10/06/13 1512     Chief Complaint  Patient presents with  . Leg Pain  . Headache     (Consider location/radiation/quality/duration/timing/severity/associated sxs/prior Treatment) HPI Comments: Pt comes in with cc of headache and bilateral leg pain and swelling, and dib. Pt has CKD, and morbid obesity. Pt started having leg pain 2 days ago, and is described as crampy and severe, worse with walking. No trauma, no n/v/f/c. Pt also has been having headache x 2 days, constant, and located in the back of her head. Pt's pain responds to advil, but returns. No numbness, tingling, vision complains or balance issues. Headache is moderate, worse when waking up. Finally, pt also states that she has been having exertional shortness of breath - like with walking at the [employer] or playing with her kids. No associated chest pain. No hx of DVT, PE.  Patient is a 48 y.o. female presenting with leg pain and headaches. The history is provided by the patient.  Leg Pain Associated symptoms: no neck pain   Headache Associated symptoms: no abdominal pain, no cough, no diarrhea, no dizziness, no pain, no nausea, no neck pain, no numbness, no photophobia and no vomiting     Past Medical History  Diagnosis Date  . Renal disorder   . Obesity    Past Surgical History  Procedure Laterality Date  . Oophorectomy    . Cesarean section    . Cholecystectomy    . Hernia repair     History reviewed. No pertinent family history. History  Substance Use Topics  . Smoking status: Never Smoker   . Smokeless tobacco: Not on file  . Alcohol Use: No   OB History   Grav Para Term Preterm Abortions TAB SAB Ect Mult Living                 Review of Systems  Constitutional: Negative for activity change.  HENT: Negative for facial swelling.   Eyes: Negative for photophobia, pain and visual disturbance.   Respiratory: Positive for shortness of breath. Negative for cough and wheezing.   Cardiovascular: Positive for chest pain and leg swelling. Negative for palpitations.  Gastrointestinal: Negative for nausea, vomiting, abdominal pain, diarrhea, constipation, blood in stool and abdominal distention.  Genitourinary: Negative for hematuria and difficulty urinating.  Musculoskeletal: Negative for neck pain.  Skin: Positive for rash. Negative for color change.  Neurological: Positive for headaches. Negative for dizziness, tremors, syncope, speech difficulty, weakness, light-headedness and numbness.  Hematological: Does not bruise/bleed easily.  Psychiatric/Behavioral: Negative for confusion.      Allergies  Review of patient's allergies indicates no known allergies.  Home Medications   Prior to Admission medications   Medication Sig Start Date End Date Taking? Authorizing Provider  ibuprofen (ADVIL,MOTRIN) 200 MG tablet Take 400 mg by mouth every 6 (six) hours as needed for headache or moderate pain.   Yes Historical Provider, MD   BP 110/57  Pulse 91  Temp(Src) 98.6 F (37 C) (Oral)  Resp 21  SpO2 96%  LMP 10/02/2013 Physical Exam  Constitutional: She is oriented to person, place, and time. She appears well-developed and well-nourished.  HENT:  Head: Normocephalic and atraumatic.  Eyes: EOM are normal. Pupils are equal, round, and reactive to light.  Neck: Neck supple.  Cardiovascular: Normal rate, regular rhythm and normal heart sounds.   No murmur heard. Pulmonary/Chest: Effort normal.  No respiratory distress.  Abdominal: Soft. She exhibits no distension. There is no tenderness. There is no rebound and no guarding.  Musculoskeletal: She exhibits tenderness. She exhibits no edema.  Neurological: She is alert and oriented to person, place, and time.  Skin: Skin is warm and dry.    ED Course  Procedures (including critical care time) Labs Review Labs Reviewed  CBC WITH  DIFFERENTIAL - Abnormal; Notable for the following:    RBC 5.12 (*)    Hemoglobin 10.6 (*)    HCT 35.5 (*)    MCV 69.3 (*)    MCH 20.7 (*)    MCHC 29.9 (*)    RDW 17.6 (*)    All other components within normal limits  URINALYSIS, ROUTINE W REFLEX MICROSCOPIC - Abnormal; Notable for the following:    APPearance CLOUDY (*)    All other components within normal limits  BASIC METABOLIC PANEL  TROPONIN I  PRO B NATRIURETIC PEPTIDE  D-DIMER, QUANTITATIVE    Imaging Review No results found.   EKG Interpretation   Date/Time:  Tuesday October 06 2013 15:32:59 EDT Ventricular Rate:  85 PR Interval:  151 QRS Duration: 101 QT Interval:  383 QTC Calculation: 455 R Axis:   -2 Text Interpretation:  Sinus rhythm Ventricular premature complex Low  voltage, precordial leads Abnormal R-wave progression, early transition  Confirmed by Tanji Storrs, MD, Janey GentaANKIT [phone]([id number]) on 10/06/2013 4:18:30 PM      MDM   Final diagnoses:  None    Pt comes in with cc of headache, leg cramping, bilateral, and exertional dib. She is morbidly obese, with HEART score of 2 (age and risk factors), and WELLS score of 1 for DVT and 0 for PE. In fact, patient is PERC negative as well.  Headaches are moderate, responding to advil, and worse in the morning. Her pupil exam shows no papilledema, and no gross vascular bleeds. Psuedotumor cerebri is possible - and so she has been advised to get outpatient follow up for the headaches. i have advised her to return to the ER if her symptoms get worse, specifically if there is any new visual or neurologic symptoms, or gait issues.  Pt will get troponin x 2. Dimer ordered as well. Will reassess.  [facility] and other medical resources will be provided if she is discharged.  Derwood KaplanAnkit Deloris Mittag, MD 10/06/13 1625  8:24 PM Dimer is slightly elevated - so US ordered. Pt was ambulated - there was no hypoxia and no dyspnea on exertion when she walked in the hallway here. PERC neg,  WELLS for PE is 0 - so i have discussed return precautions for DIB - and she will return if her sx get worse, but currently i dont think we need to get CT PE to r/o PE. US - no DVT, + phlebitis.  Derwood KaplanAnkit Shelanda Duvall, MD 10/06/13 2025

## 2013-10-06 NOTE — ED Notes (Signed)
Pt reports having swelling and pain to bilateral lower legs for extended amount of time and having "knots and redness" to lower legs. having headache for days, denies n/v.

## 2013-10-06 NOTE — Progress Notes (Signed)
*  PRELIMINARY RESULTS* Vascular Ultrasound Lower extremity venous duplex has been completed.  Preliminary findings: Technically limited due to body habitus. Bilateral femoral veins poorly visualized. No obvious evidence of DVT in visualized veins. At area of complaint, at right calf, there is a varicose vein with thickened walls vs partial superficial thrombus. Right baker's cyst noted.  Farrel DemarkJill Eunice, RDMS, RVT  10/06/2013, 6:22 PM

## 2013-10-06 NOTE — ED Notes (Signed)
Checked Pulse Ox while ambulating. Pt's spO2

## 2013-10-13 ENCOUNTER — Emergency Department (HOSPITAL_COMMUNITY)
Admission: EM | Admit: 2013-10-13 | Discharge: 2013-10-13 | Disposition: A | Discharge status: Home / Self Care | Payer: Self-pay | Attending: Emergency Medicine | Admitting: Emergency Medicine

## 2013-10-13 ENCOUNTER — Encounter (HOSPITAL_COMMUNITY): Payer: Self-pay | Admitting: Emergency Medicine

## 2013-10-13 DIAGNOSIS — Z8742 Personal history of other diseases of the female genital tract: Secondary | ICD-10-CM | POA: Insufficient documentation

## 2013-10-13 DIAGNOSIS — L03319 Cellulitis of trunk, unspecified: Principal | ICD-10-CM

## 2013-10-13 DIAGNOSIS — L02219 Cutaneous abscess of trunk, unspecified: Secondary | ICD-10-CM | POA: Insufficient documentation

## 2013-10-13 DIAGNOSIS — L0390 Cellulitis, unspecified: Secondary | ICD-10-CM

## 2013-10-13 DIAGNOSIS — E669 Obesity, unspecified: Secondary | ICD-10-CM | POA: Insufficient documentation

## 2013-10-13 DIAGNOSIS — L0291 Cutaneous abscess, unspecified: Secondary | ICD-10-CM

## 2013-10-13 MED ORDER — HYDROCODONE-ACETAMINOPHEN 5-325 MG PO TABS: 2.0000 | ORAL_TABLET | ORAL | Status: DC | PRN

## 2013-10-13 MED ORDER — CEPHALEXIN 500 MG PO CAPS: 500.0000 mg | ORAL_CAPSULE | Freq: Four times a day (QID) | ORAL | Status: DC

## 2013-10-13 NOTE — ED Notes (Signed)
Pt states she has a big cyst on her stomach. It is on the lower right, it is painful, 4/10. No pain meds taken.  It is red and the size of a quarter. No fever. No v/d.

## 2013-10-13 NOTE — ED Provider Notes (Signed)
CSN: [id number]     Arrival date & time 10/13/13  1308 History  This chart was scribed for Rita BeckKaitlyn Shneur [place] PA-C working with Rita CoKevin M Campos, MD by Ashley JacobsBrittany Andrews, ED scribe. This patient was seen in room TR06C/TR06C and the patient's care was started at 3:02 PM.   First MD Initiated Contact with Patient 10/13/13 1443     Chief Complaint  Patient presents with  . Cyst     (Consider location/radiation/quality/duration/timing/severity/associated sxs/prior Treatment) The history is provided by the patient and medical records. No language interpreter was used.   HPI Comments: Rita Bass is a 48 y.o. female who presents to the Emergency Department complaining of three red, swollen, cysts to her lower abdomen. She has a large abscess to her right lower abdomen that is painful to the touch. Denies fever, nausea, and vomiting. The pain is throbbing and severe without radiation.    Past Medical History  Diagnosis Date  . Renal disorder   . Obesity    Past Surgical History  Procedure Laterality Date  . Oophorectomy    . Cesarean section    . Cholecystectomy    . Hernia repair     History reviewed. No pertinent family history. History  Substance Use Topics  . Smoking status: Never Smoker   . Smokeless tobacco: Not on file  . Alcohol Use: No   OB History   Grav Para Term Preterm Abortions TAB SAB Ect Mult Living                 Review of Systems  Constitutional: Negative for fever.  Skin: Positive for color change and wound.  All other systems reviewed and are negative.     Allergies  Review of patient's allergies indicates no known allergies.  Home Medications   Prior to Admission medications   Medication Sig Start Date End Date Taking? Authorizing Provider  ibuprofen (ADVIL,MOTRIN) 200 MG tablet Take 400 mg by mouth every 6 (six) hours as needed for headache or moderate pain.    Historical Provider, MD  traMADol (ULTRAM) 50 MG tablet Take 1 tablet (50 mg total)  by mouth every 6 (six) hours as needed. 10/06/13   Ankit Rhunette CroftNanavati, MD   BP 126/92  Pulse 93  Temp(Src) 98.3 F (36.8 C) (Oral)  Resp 16  Wt 350 lb 8 oz (158.986 kg)  SpO2 95%  LMP 10/02/2013 Physical Exam  Nursing note and vitals reviewed. Constitutional: She is oriented to person, place, and time. She appears well-developed and well-nourished. No distress.  HENT:  Head: Normocephalic and atraumatic.  Eyes: Conjunctivae are normal.  Neck: Normal range of motion.  Cardiovascular: Normal rate and regular rhythm.  Exam reveals no gallop and no friction rub.   No murmur heard. Pulmonary/Chest: Effort normal and breath sounds normal. She has no wheezes. She has no rales. She exhibits no tenderness.  Abdominal: Soft. There is no tenderness.  See skin.   Musculoskeletal: Normal range of motion.  Neurological: She is alert and oriented to person, place, and time. Coordination normal.  Speech is goal-oriented. Moves limbs without ataxia.   Skin: Skin is warm and dry.  3x3cm fluctuant mass on lower right abdomen without drainage. There is surrounding erythema and induration. 2x2cm and 3x1cm area of induration and erythema of left lower abdomen with surrounding erythema.   Psychiatric: She has a normal mood and affect. Her behavior is normal.    ED Course  Procedures (including critical care time) DIAGNOSTIC STUDIES: Oxygen Saturation is 95%  on room air, normal by my interpretation.    COORDINATION OF CARE:  3:05 PM Discussed course of care with pt which includes I&D. Pt understands and agrees. 3:32 PM INCISION AND DRAINAGE  Performed by: Monia Pouch PA-C student Authorized by: Rita Beck PA-C  Consent - Verbal Consent obtained Risks and benefits: risks/benefits and alternatives were discussed  Type: Abscess  Body Area: lower right lateral abdomen  Anesthesia: Local infiltration Local anesthetic: lidocaine 2%w/ epinephrine  Anesthetic total: 3 ml  Complexity:  Complex  Blunt dissection to break up loculations  Drainage: Purulent  Drainage amount: ample  Packing material: 1/4 iodoform gauze  Patient tolerance: Patient tolerated the procedure well with no immediate complications    Labs Review Labs Reviewed - No data to display  Imaging Review No results found.   EKG Interpretation None      MDM   Final diagnoses:  Abscess and cellulitis    Patient's abscess drained without complications. Patient will be discharged with Keflex and Vicodin. Patient instructed to return with worsening or concerning symptoms. Vitals stable and patient afebrile. Patient instructed to return in 2 days for wound check.   I personally performed the services described in this documentation, which was scribed in my presence. The recorded information has been reviewed and is accurate.   Rita Bass, [place] 10/14/13 [phone]

## 2013-10-13 NOTE — ED Notes (Signed)
Declined W/C at D/C and was escorted to lobby by RN. 

## 2013-10-13 NOTE — Discharge Instructions (Signed)
Take Keflex as directed until gone. Take vicodin as needed for pain. Refer to attached documents for more information. Return to the ED in 2 days for wound check.

## 2013-10-19 ENCOUNTER — Encounter (HOSPITAL_COMMUNITY): Payer: Self-pay | Admitting: Emergency Medicine

## 2013-10-19 ENCOUNTER — Emergency Department (HOSPITAL_COMMUNITY)
Admission: EM | Admit: 2013-10-19 | Discharge: 2013-10-19 | Disposition: A | Discharge status: Home / Self Care | Payer: Self-pay | Attending: Emergency Medicine | Admitting: Emergency Medicine

## 2013-10-19 DIAGNOSIS — Z792 Long term (current) use of antibiotics: Secondary | ICD-10-CM | POA: Insufficient documentation

## 2013-10-19 DIAGNOSIS — Z9889 Other specified postprocedural states: Secondary | ICD-10-CM | POA: Insufficient documentation

## 2013-10-19 DIAGNOSIS — E669 Obesity, unspecified: Secondary | ICD-10-CM | POA: Insufficient documentation

## 2013-10-19 DIAGNOSIS — Z87448 Personal history of other diseases of urinary system: Secondary | ICD-10-CM | POA: Insufficient documentation

## 2013-10-19 DIAGNOSIS — L03319 Cellulitis of trunk, unspecified: Principal | ICD-10-CM

## 2013-10-19 DIAGNOSIS — L0291 Cutaneous abscess, unspecified: Secondary | ICD-10-CM

## 2013-10-19 DIAGNOSIS — L02219 Cutaneous abscess of trunk, unspecified: Secondary | ICD-10-CM | POA: Insufficient documentation

## 2013-10-19 DIAGNOSIS — Z9089 Acquired absence of other organs: Secondary | ICD-10-CM | POA: Insufficient documentation

## 2013-10-19 MED ORDER — SULFAMETHOXAZOLE-TRIMETHOPRIM 800-160 MG PO TABS: 2.0000 | ORAL_TABLET | Freq: Two times a day (BID) | ORAL | Status: DC

## 2013-10-19 MED ORDER — HYDROCODONE-ACETAMINOPHEN 5-325 MG PO TABS: 2.0000 | ORAL_TABLET | ORAL | Status: DC | PRN

## 2013-10-19 NOTE — ED Provider Notes (Signed)
CSN: [id number]     Arrival date & time 10/19/13  1231 History  This chart was scribed for non-physician practitioner, Rhea Bleacher, PA-C working with Flint Melter, MD, by Jarvis Morgan, ED Scribe. This patient was seen in room TR06C/TR06C and the patient's care was started at 1:05 PM.    Chief Complaint  Patient presents with  . Abscess    The history is provided by the patient. No language interpreter was used.   HPI Comments: Rita Bass is a 48 y.o. female with a h/o renal disorder and obesity who presents to the Emergency Department complaining of an abscess to her LLQ. She states that there is an area of redness around the abscess that is gradually worsening. She was also seen on 10/14/13 in the ED with an abscess in her RLQ. The abscess and incised and drained and she was given Keflex and Vicodin. She states there has been some yellow drainage from the site and gradually worsening redness around site of abscess. She also states that she is having some increasing pain in the area. She also states that she has run out of her pain medication. She states she has had abscesses before but states that they would always go away on their own. She denies any fever, nausea, or emesis.    Past Medical History  Diagnosis Date  . Renal disorder   . Obesity    Past Surgical History  Procedure Laterality Date  . Oophorectomy    . Cesarean section    . Cholecystectomy    . Hernia repair     History reviewed. No pertinent family history. History  Substance Use Topics  . Smoking status: Never Smoker   . Smokeless tobacco: Not on file  . Alcohol Use: No   OB History   Grav Para Term Preterm Abortions TAB SAB Ect Mult Living                 Review of Systems  Constitutional: Negative for fever.  Gastrointestinal: Positive for abdominal pain (pain to area of abscesses). Negative for nausea and vomiting.  Skin: Positive for color change (red areas surrounding the abscess in RLQ and  LLQ).       Abscess to LLQ and RLQ. Yellow drainage from right  Hematological: Negative for adenopathy.      Allergies  Review of patient's allergies indicates no known allergies.  Home Medications   Prior to Admission medications   Medication Sig Start Date End Date Taking? Authorizing Provider  cephALEXin (KEFLEX) 500 MG capsule Take 1 capsule (500 mg total) by mouth 4 (four) times daily. 10/13/13   Emilia Beck, PA-C  HYDROcodone-acetaminophen (NORCO/VICODIN) 5-325 MG per tablet Take 2 tablets by mouth every 4 (four) hours as needed for moderate pain or severe pain. 10/13/13   Kaitlyn Szekalski, PA-C  ibuprofen (ADVIL,MOTRIN) 200 MG tablet Take 400 mg by mouth every 6 (six) hours as needed for headache or moderate pain.    Historical Provider, MD  traMADol (ULTRAM) 50 MG tablet Take 1 tablet (50 mg total) by mouth every 6 (six) hours as needed. 10/06/13   Derwood Kaplan, MD   Triage Vitals: BP 131/95  Pulse 98  Temp(Src) 97.8 F (36.6 C) (Oral)  Ht 5\' 4"  (1.626 m)  Wt 330 lb (149.687 kg)  BMI 56.62 kg/m2  SpO2 96%  LMP 10/02/2013  Physical Exam  Nursing note and vitals reviewed. Constitutional: She appears well-developed and well-nourished. No distress.  HENT:  Head: Normocephalic and  atraumatic.  Eyes: Conjunctivae and EOM are normal.  Neck: Neck supple. No tracheal deviation present.  Cardiovascular: Normal rate.   Pulmonary/Chest: Effort normal. No respiratory distress.  Musculoskeletal: Normal range of motion.  Skin: Skin is warm and dry.  2 cm x 2 cm induration with mild overlying erythema with minimal surrounding cellulitis to left lower quadrant of pannus. Healing abscess right lower abd looks like healing well, appropriately.   Psychiatric: She has a normal mood and affect. Her behavior is normal.    ED Course  Procedures (including critical care time)  DIAGNOSTIC STUDIES: Oxygen Saturation is 96% on RA, adequate by my interpretation.    COORDINATION OF  CARE: 1:10 PM- Will order I & D. Pt advised of plan for treatment and pt agrees.  INCISION AND DRAINAGE  Performed by: Emmit AlexandersGeiple PA-C Authorized by: Daisy LazarGeiple PA-C  Consent - Verbal Consent obtained Risks and benefits: risks/benefits and alternatives were discussed  Type: Abscess  Body Area: abdominal wall  Anesthesia: Local infiltration Local anesthetic: lidocaine 2% with epinephrine  Anesthetic total: 2 ml  Complexity: Complex  Blunt dissection to break up loculations  Drainage: Purulent  Drainage amount: small  Packing material: none  Patient tolerance: Patient tolerated the procedure well with no immediate complications   Labs Review Labs Reviewed - No data to display  Imaging Review No results found.   EKG Interpretation None      Vital signs reviewed and are as follows: Filed Vitals:   10/19/13 1345  BP: 120/91  Pulse: 90  Temp:   Resp: 18   1:47 PM The patient was urged to return to the Emergency Department urgently with worsening pain, swelling, expanding erythema especially if it streaks away from the affected area, fever, or if they have any other concerns.   The patient was urged to return to the Emergency Department or go to their PCP in 48 hours for wound recheck if the area is not significantly improved.  The patient verbalized understanding and stated agreement with this plan.   Patient counseled on use of narcotic pain medications. Counseled not to combine these medications with others containing tylenol. Urged not to drink alcohol, drive, or perform any other activities that requires focus while taking these medications. The patient verbalizes understanding and agrees with the plan.  MDM   Final diagnoses:  Abscess   Patient with skin abscess amenable to incision and drainage.   I personally performed the services described in this documentation, which was scribed in my presence. The recorded information has been reviewed and is  accurate.     Renne CriglerJoshua Nishika Parkhurst, PA-C 10/19/13 1348

## 2013-10-19 NOTE — ED Provider Notes (Signed)
Medical screening examination/treatment/procedure(s) were performed by non-physician practitioner and as supervising physician I was immediately available for consultation/collaboration.  Natoya Viscomi L Jeanie Mccard, MD 10/19/13 1703 

## 2013-10-19 NOTE — Discharge Instructions (Signed)
Please read and follow all provided instructions.  Your diagnoses today include:  1. Abscess     Tests performed today include:  Vital signs. See below for your results today.   Medications prescribed:   Bactrim (trimethoprim/sulfamethoxazole) - antibiotic  You have been prescribed an antibiotic medicine: take the entire course of medicine even if you are feeling better. Stopping early can cause the antibiotic not to work.   Vicodin (hydrocodone/acetaminophen) - narcotic pain medication  DO NOT drive or perform any activities that require you to be awake and alert because this medicine can make you drowsy. BE VERY CAREFUL not to take multiple medicines containing Tylenol (also called acetaminophen). Doing so can lead to an overdose which can damage your liver and cause liver failure and possibly death.  Take any prescribed medications only as directed.   Home care instructions:   Follow any educational materials contained in this packet  Follow-up instructions: Return to the Emergency Department in 48 hours for a recheck if your symptoms are not significantly improved.  Please follow-up with your primary care provider in the next 1 week for further evaluation of your symptoms.   Return instructions:  Return to the Emergency Department if you have:  Fever  Worsening symptoms  Worsening pain  Worsening swelling  Redness of the skin that moves away from the affected area, especially if it streaks away from the affected area   Any other emergent concerns  Additional Information: If you have recurrent abscesses, try both the following. Use a Qtip to apply an over-the-counter antibiotic to the inside of your nostrils, twice a day for 5 days. Wash your body with over-the-counter Hibaclens once a day for one week and then once every two weeks. This can reduce the amount of bacterial on your skin that causes boils and lead to fewer boils. If you continue to have multiple or  recurrent boils, you should see a dermatologist (skin doctor).   Your vital signs today were: BP 131/95   Pulse 98   Temp(Src) 97.8 F (36.6 C) (Oral)   Ht 5\' 4"  (1.626 m)   Wt 330 lb (149.687 kg)   BMI 56.62 kg/m2   SpO2 96%   LMP 10/02/2013 If your blood pressure (BP) was elevated above 135/85 this visit, please have this repeated by your doctor within one month. --------------

## 2013-10-19 NOTE — ED Provider Notes (Signed)
Medical screening examination/treatment/procedure(s) were performed by non-physician practitioner and as supervising physician I was immediately available for consultation/collaboration.   EKG Interpretation None        Yael Angerer M Masiya Claassen, MD 10/19/13 2307 

## 2013-10-19 NOTE — ED Notes (Signed)
Pt here for recheck of abscess on abd, pt sts last week was i and d and now has another on opposide of abd.

## 2013-11-20 ENCOUNTER — Encounter (HOSPITAL_COMMUNITY): Payer: Self-pay | Admitting: Emergency Medicine

## 2013-11-20 ENCOUNTER — Emergency Department (HOSPITAL_COMMUNITY)
Admission: EM | Admit: 2013-11-20 | Discharge: 2013-11-20 | Discharge status: Against Medical Advice | Payer: Self-pay | Attending: Emergency Medicine | Admitting: Emergency Medicine

## 2013-11-20 DIAGNOSIS — E669 Obesity, unspecified: Secondary | ICD-10-CM | POA: Insufficient documentation

## 2013-11-20 DIAGNOSIS — R1110 Vomiting, unspecified: Secondary | ICD-10-CM | POA: Insufficient documentation

## 2013-11-20 DIAGNOSIS — R109 Unspecified abdominal pain: Secondary | ICD-10-CM | POA: Insufficient documentation

## 2013-11-20 LAB — COMPREHENSIVE METABOLIC PANEL
ALT: 20 U/L (ref 0–35)
ANION GAP: 12 (ref 5–15)
AST: 14 U/L (ref 0–37)
Albumin: 3.5 g/dL (ref 3.5–5.2)
Alkaline Phosphatase: 81 U/L (ref 39–117)
BILIRUBIN TOTAL: 0.4 mg/dL (ref 0.3–1.2)
BUN: 8 mg/dL (ref 6–23)
CHLORIDE: 97 meq/L (ref 96–112)
CO2: 28 meq/L (ref 19–32)
CREATININE: 0.58 mg/dL (ref 0.50–1.10)
Calcium: 9.7 mg/dL (ref 8.4–10.5)
GLUCOSE: 114 mg/dL — AB (ref 70–99)
POTASSIUM: 4.2 meq/L (ref 3.7–5.3)
Sodium: 137 mEq/L (ref 137–147)
Total Protein: 8 g/dL (ref 6.0–8.3)

## 2013-11-20 LAB — CBC WITH DIFFERENTIAL/PLATELET
BASOS PCT: 0 % (ref 0–1)
Basophils Absolute: 0 10*3/uL (ref 0.0–0.1)
EOS ABS: 0.4 10*3/uL (ref 0.0–0.7)
EOS PCT: 4 % (ref 0–5)
HCT: 34.6 % — ABNORMAL LOW (ref 36.0–46.0)
HEMOGLOBIN: 10.6 g/dL — AB (ref 12.0–15.0)
LYMPHS ABS: 2.2 10*3/uL (ref 0.7–4.0)
Lymphocytes Relative: 20 % (ref 12–46)
MCH: 20.9 pg — ABNORMAL LOW (ref 26.0–34.0)
MCHC: 30.6 g/dL (ref 30.0–36.0)
MCV: 68.2 fL — AB (ref 78.0–100.0)
MONO ABS: 0.6 10*3/uL (ref 0.1–1.0)
Monocytes Relative: 6 % (ref 3–12)
NEUTROS ABS: 7.6 10*3/uL (ref 1.7–7.7)
Neutrophils Relative %: 70 % (ref 43–77)
Platelets: 365 10*3/uL (ref 150–400)
RBC: 5.07 MIL/uL (ref 3.87–5.11)
RDW: 18 % — ABNORMAL HIGH (ref 11.5–15.5)
WBC: 10.8 10*3/uL — ABNORMAL HIGH (ref 4.0–10.5)

## 2013-11-20 LAB — URINALYSIS, ROUTINE W REFLEX MICROSCOPIC
BILIRUBIN URINE: NEGATIVE
Glucose, UA: NEGATIVE mg/dL
Hgb urine dipstick: NEGATIVE
Ketones, ur: NEGATIVE mg/dL
Leukocytes, UA: NEGATIVE
Nitrite: NEGATIVE
PROTEIN: NEGATIVE mg/dL
Specific Gravity, Urine: 1.005 (ref 1.005–1.030)
Urobilinogen, UA: 0.2 mg/dL (ref 0.0–1.0)
pH: 6 (ref 5.0–8.0)

## 2013-11-20 NOTE — ED Notes (Addendum)
Pt c/o LLQ pain that radiates to L lower back and emesis since this am.  Abdominal pain now diffuse.  Pt nausea medication from pharmacy and nausea is improved.  Denies changes in bowel or bladder habits.

## 2014-03-13 ENCOUNTER — Encounter (HOSPITAL_COMMUNITY): Payer: Self-pay

## 2014-03-13 ENCOUNTER — Emergency Department (HOSPITAL_COMMUNITY): Payer: Self-pay

## 2014-03-13 ENCOUNTER — Emergency Department (HOSPITAL_COMMUNITY)
Admission: EM | Admit: 2014-03-13 | Discharge: 2014-03-13 | Disposition: A | Discharge status: Home / Self Care | Payer: Self-pay | Attending: Emergency Medicine | Admitting: Emergency Medicine

## 2014-03-13 DIAGNOSIS — Z792 Long term (current) use of antibiotics: Secondary | ICD-10-CM | POA: Insufficient documentation

## 2014-03-13 DIAGNOSIS — R52 Pain, unspecified: Secondary | ICD-10-CM

## 2014-03-13 DIAGNOSIS — E669 Obesity, unspecified: Secondary | ICD-10-CM | POA: Insufficient documentation

## 2014-03-13 DIAGNOSIS — M25561 Pain in right knee: Secondary | ICD-10-CM | POA: Insufficient documentation

## 2014-03-13 DIAGNOSIS — Z79899 Other long term (current) drug therapy: Secondary | ICD-10-CM | POA: Insufficient documentation

## 2014-03-13 DIAGNOSIS — M1712 Unilateral primary osteoarthritis, left knee: Secondary | ICD-10-CM | POA: Insufficient documentation

## 2014-03-13 DIAGNOSIS — Z87448 Personal history of other diseases of urinary system: Secondary | ICD-10-CM | POA: Insufficient documentation

## 2014-03-13 MED ORDER — TRAMADOL HCL 50 MG PO TABS: 50.0000 mg | ORAL_TABLET | Freq: Four times a day (QID) | ORAL | Status: DC | PRN

## 2014-03-13 NOTE — ED Notes (Signed)
Patient returned from X-ray 

## 2014-03-13 NOTE — ED Notes (Signed)
Pt continues to wait for xrays. Charge nurse notified.

## 2014-03-13 NOTE — ED Provider Notes (Signed)
CSN: [id number]     Arrival date & time 03/13/14  1104 History  This chart was scribed for non-physician practitioner working with Rita Bass, * by Richarda Overlieichard Holland, ED Scribe. This patient was seen in room TR05C/TR05C and the patient's care was started at 11:29 AM.    Chief Complaint  Patient presents with  . Knee Pain    The history is provided by the patient. No language interpreter was used.   HPI Comments: Rita Bass is a 48 y.o. female who presents to the Emergency Department complaining of bilateral knee pain for the last week. Pt reports her knees feel like they are swollen and says she is having trouble walking. Pt denies any falls or injuries. She states that she has taken no medication PTA, but normally takes ibuprofen which has failed to provide her relief. She reports that she has had her right knee previously x-rayed. She states that MD reported she had fluid in her knees. Pt reports her left knee is more painful with movement than the right at this time. She reports NKDA. She denies warmth and redness to the area. She states that she has chronic bilateral lower swelling intermittently and today is not any different then normal  Past Medical History  Diagnosis Date  . Obesity   . Renal disorder    Past Surgical History  Procedure Laterality Date  . Oophorectomy    . Cesarean section    . Cholecystectomy    . Hernia repair     No family history on file. History  Substance Use Topics  . Smoking status: Never Smoker   . Smokeless tobacco: Not on file  . Alcohol Use: No   OB History    No data available     Review of Systems  Musculoskeletal: Positive for arthralgias.  All other systems reviewed and are negative.     Allergies  Review of patient's allergies indicates no known allergies.  Home Medications   Prior to Admission medications   Medication Sig Start Date End Date Taking? Authorizing Provider  cephALEXin (KEFLEX) 500 MG capsule  Take 1 capsule (500 mg total) by mouth 4 (four) times daily. 10/13/13   Emilia BeckKaitlyn Szekalski, PA-C  HYDROcodone-acetaminophen (NORCO/VICODIN) 5-325 MG per tablet Take 2 tablets by mouth every 4 (four) hours as needed for moderate pain or severe pain. 10/19/13   Renne CriglerJoshua Geiple, PA-C  ibuprofen (ADVIL,MOTRIN) 200 MG tablet Take 400 mg by mouth every 6 (six) hours as needed for headache or moderate pain.    Historical Provider, MD  sulfamethoxazole-trimethoprim (BACTRIM DS,SEPTRA DS) 800-160 MG per tablet Take 2 tablets by mouth 2 (two) times daily. 10/19/13   Renne CriglerJoshua Geiple, PA-C  traMADol (ULTRAM) 50 MG tablet Take 1 tablet (50 mg total) by mouth every 6 (six) hours as needed. 10/06/13   Ankit Rhunette CroftNanavati, MD   BP 113/93 mmHg  Pulse 85  Temp(Src) 98.1 F (36.7 C) (Oral)  Resp 18  SpO2 98% Physical Exam  Constitutional: She is oriented to person, place, and time. She appears well-developed and well-nourished. No distress.  HENT:  Head: Normocephalic and atraumatic.  Eyes: Right eye exhibits no discharge. Left eye exhibits no discharge.  Neck: Neck supple. No tracheal deviation present.  Cardiovascular: Normal rate.   Pulmonary/Chest: Effort normal. No respiratory distress.  Abdominal: She exhibits no distension.  Musculoskeletal:  Tenderness on palpation to the left knee. No redness unilateral swelling noted. Pt has full rom  Neurological: She is alert and oriented to person,  place, and time. She exhibits normal muscle tone. Coordination normal.  Skin: Skin is warm and dry. She is not diaphoretic.  Psychiatric: She has a normal mood and affect. Her behavior is normal.  Nursing note and vitals reviewed.   ED Course  Procedures  DIAGNOSTIC STUDIES: Oxygen Saturation is 98% on RA, normal by my interpretation.    COORDINATION OF CARE: 11:33 AM Discussed treatment plan with pt at bedside and pt agreed to plan.   Labs Review Labs Reviewed - No data to display  Imaging Review Dg Knee Complete 4  Views Left  03/13/2014   CLINICAL DATA:  Left knee pain and swelling for 1 week.  EXAM: LEFT KNEE - COMPLETE 4+ VIEW  COMPARISON:  None.  FINDINGS: Small osteophytes in the medial knee compartment. No significant joint space narrowing. The knee is located without acute fracture. Mild degenerative changes at the patellofemoral compartment. No large joint effusion.  IMPRESSION: Mild osteoarthritis in the left knee.  No acute bone abnormality.   Electronically Signed   By: Richarda OverlieAdam  Henn M.D.   On: 03/13/2014 14:07     EKG Interpretation None      MDM   Final diagnoses:  Pain  Primary osteoarthritis of left knee   No acute bony abnormality. Pt was given follow up with ortho and script for ultram.   I personally performed the services described in this documentation, which was scribed in my presence. The recorded information has been reviewed and is accurate.    Teressa LowerVrinda Saw Mendenhall, NP 03/13/14 1431  Rita Creasehristopher J. Pollina, MD 03/13/14 [phone]

## 2014-03-13 NOTE — ED Notes (Signed)
PA at the bedside.

## 2014-03-13 NOTE — Discharge Instructions (Signed)
Arthritis, Nonspecific °Arthritis is inflammation of a joint. This usually means pain, redness, warmth or swelling are present. One or more joints may be involved. There are a number of types of arthritis. Your caregiver may not be able to tell what type of arthritis you have right away. °CAUSES  °The most common cause of arthritis is the wear and tear on the joint (osteoarthritis). This causes damage to the cartilage, which can break down over time. The knees, hips, back and neck are most often affected by this type of arthritis. °Other types of arthritis and common causes of joint pain include: °· Sprains and other injuries near the joint. Sometimes minor sprains and injuries cause pain and swelling that develop hours later. °· Rheumatoid arthritis. This affects hands, feet and knees. It usually affects both sides of your body at the same time. It is often associated with chronic ailments, fever, weight loss and general weakness. °· Crystal arthritis. Gout and pseudo gout can cause occasional acute severe pain, redness and swelling in the foot, ankle, or knee. °· Infectious arthritis. Bacteria can get into a joint through a break in overlying skin. This can cause infection of the joint. Bacteria and viruses can also spread through the blood and affect your joints. °· Drug, infectious and allergy reactions. Sometimes joints can become mildly painful and slightly swollen with these types of illnesses. °SYMPTOMS  °· Pain is the main symptom. °· Your joint or joints can also be red, swollen and warm or hot to the touch. °· You may have a fever with certain types of arthritis, or even feel overall ill. °· The joint with arthritis will hurt with movement. Stiffness is present with some types of arthritis. °DIAGNOSIS  °Your caregiver will suspect arthritis based on your description of your symptoms and on your exam. Testing may be needed to find the type of arthritis: °· Blood and sometimes urine tests. °· X-ray tests  and sometimes CT or MRI scans. °· Removal of fluid from the joint (arthrocentesis) is done to check for bacteria, crystals or other causes. Your caregiver (or a specialist) will numb the area over the joint with a local anesthetic, and use a needle to remove joint fluid for examination. This procedure is only minimally uncomfortable. °· Even with these tests, your caregiver may not be able to tell what kind of arthritis you have. Consultation with a specialist (rheumatologist) may be helpful. °TREATMENT  °Your caregiver will discuss with you treatment specific to your type of arthritis. If the specific type cannot be determined, then the following general recommendations may apply. °Treatment of severe joint pain includes: °· Rest. °· Elevation. °· Anti-inflammatory medication (for example, ibuprofen) may be prescribed. Avoiding activities that cause increased pain. °· Only take over-the-counter or prescription medicines for pain and discomfort as recommended by your caregiver. °· Cold packs over an inflamed joint may be used for 10 to 15 minutes every hour. Hot packs sometimes feel better, but do not use overnight. Do not use hot packs if you are diabetic without your caregiver's permission. °· A cortisone shot into arthritic joints may help reduce pain and swelling. °· Any acute arthritis that gets worse over the next 1 to 2 days needs to be looked at to be sure there is no joint infection. °Long-term arthritis treatment involves modifying activities and lifestyle to reduce joint stress jarring. This can include weight loss. Also, exercise is needed to nourish the joint cartilage and remove waste. This helps keep the muscles   around the joint strong. °HOME CARE INSTRUCTIONS  °· Do not take aspirin to relieve pain if gout is suspected. This elevates uric acid levels. °· Only take over-the-counter or prescription medicines for pain, discomfort or fever as directed by your caregiver. °· Rest the joint as much as  possible. °· If your joint is swollen, keep it elevated. °· Use crutches if the painful joint is in your leg. °· Drinking plenty of fluids may help for certain types of arthritis. °· Follow your caregiver's dietary instructions. °· Try low-impact exercise such as: °¨ Swimming. °¨ Water aerobics. °¨ Biking. °¨ Walking. °· Morning stiffness is often relieved by a warm shower. °· Put your joints through regular range-of-motion. °SEEK MEDICAL CARE IF:  °· You do not feel better in 24 hours or are getting worse. °· You have side effects to medications, or are not getting better with treatment. °SEEK IMMEDIATE MEDICAL CARE IF:  °· You have a fever. °· You develop severe joint pain, swelling or redness. °· Many joints are involved and become painful and swollen. °· There is severe back pain and/or leg weakness. °· You have loss of bowel or bladder control. °Document Released: 04/12/2004 Document Revised: 05/28/2011 Document Reviewed: 04/28/2008 °Exit[employer]® Patient Information ©2015 [employer]. This information is not intended to replace advice given to you by your health care provider. Make sure you discuss any questions you have with your health care provider. ° °

## 2014-03-13 NOTE — ED Notes (Signed)
Per Patient, Patient has bilateral knee pain. Patient states, "They both feel like they have swollen, and I am having trouble walking. I cannot cross my legs without having pain." Patient reports having history of the same and MD reported she had fluid in her knees. Reports the left leg being more painful with movement than the right. Patient has had right knee assessed, but not the left. Patient reports knees "giving out on me" and being unable to make long walks because they ache all the time. Patient walked into room with steady gait.

## 2014-04-17 ENCOUNTER — Encounter (HOSPITAL_COMMUNITY): Payer: Self-pay | Admitting: Cardiology

## 2014-04-17 ENCOUNTER — Emergency Department (HOSPITAL_COMMUNITY)
Admission: EM | Admit: 2014-04-17 | Discharge: 2014-04-17 | Disposition: A | Discharge status: Home / Self Care | Payer: Self-pay | Attending: Emergency Medicine | Admitting: Emergency Medicine

## 2014-04-17 DIAGNOSIS — R200 Anesthesia of skin: Secondary | ICD-10-CM | POA: Insufficient documentation

## 2014-04-17 DIAGNOSIS — Z87448 Personal history of other diseases of urinary system: Secondary | ICD-10-CM | POA: Insufficient documentation

## 2014-04-17 DIAGNOSIS — M25569 Pain in unspecified knee: Secondary | ICD-10-CM

## 2014-04-17 DIAGNOSIS — M7989 Other specified soft tissue disorders: Secondary | ICD-10-CM

## 2014-04-17 DIAGNOSIS — M25562 Pain in left knee: Secondary | ICD-10-CM | POA: Insufficient documentation

## 2014-04-17 DIAGNOSIS — Z79899 Other long term (current) drug therapy: Secondary | ICD-10-CM | POA: Insufficient documentation

## 2014-04-17 DIAGNOSIS — Z792 Long term (current) use of antibiotics: Secondary | ICD-10-CM | POA: Insufficient documentation

## 2014-04-17 DIAGNOSIS — E669 Obesity, unspecified: Secondary | ICD-10-CM | POA: Insufficient documentation

## 2014-04-17 MED ORDER — HYDROCODONE-ACETAMINOPHEN 5-325 MG PO TABS: 1.0000 | ORAL_TABLET | ORAL | Status: DC | PRN

## 2014-04-17 MED ORDER — HYDROCODONE-ACETAMINOPHEN 5-325 MG PO TABS
2.0000 | ORAL_TABLET | Freq: Once | ORAL | Status: AC
  Administered 2014-04-17: 2 via ORAL
  Filled 2014-04-17: qty 2

## 2014-04-17 NOTE — Progress Notes (Signed)
VASCULAR LAB PRELIMINARY  PRELIMINARY  PRELIMINARY  PRELIMINARY  Left lower extremity venous Doppler completed.    Preliminary report:  There is no obvious evidence of DVT or SVT noted in the left lower extremity.  Donesha Wallander, RVT 04/17/2014, 7:05 PM

## 2014-04-17 NOTE — Discharge Instructions (Signed)
Arthritis, Nonspecific °Arthritis is inflammation of a joint. This usually means pain, redness, warmth or swelling are present. One or more joints may be involved. There are a number of types of arthritis. Your caregiver may not be able to tell what type of arthritis you have right away. °CAUSES  °The most common cause of arthritis is the wear and tear on the joint (osteoarthritis). This causes damage to the cartilage, which can break down over time. The knees, hips, back and neck are most often affected by this type of arthritis. °Other types of arthritis and common causes of joint pain include: °· Sprains and other injuries near the joint. Sometimes minor sprains and injuries cause pain and swelling that develop hours later. °· Rheumatoid arthritis. This affects hands, feet and knees. It usually affects both sides of your body at the same time. It is often associated with chronic ailments, fever, weight loss and general weakness. °· Crystal arthritis. Gout and pseudo gout can cause occasional acute severe pain, redness and swelling in the foot, ankle, or knee. °· Infectious arthritis. Bacteria can get into a joint through a break in overlying skin. This can cause infection of the joint. Bacteria and viruses can also spread through the blood and affect your joints. °· Drug, infectious and allergy reactions. Sometimes joints can become mildly painful and slightly swollen with these types of illnesses. °SYMPTOMS  °· Pain is the main symptom. °· Your joint or joints can also be red, swollen and warm or hot to the touch. °· You may have a fever with certain types of arthritis, or even feel overall ill. °· The joint with arthritis will hurt with movement. Stiffness is present with some types of arthritis. °DIAGNOSIS  °Your caregiver will suspect arthritis based on your description of your symptoms and on your exam. Testing may be needed to find the type of arthritis: °· Blood and sometimes urine tests. °· X-ray tests  and sometimes CT or MRI scans. °· Removal of fluid from the joint (arthrocentesis) is done to check for bacteria, crystals or other causes. Your caregiver (or a specialist) will numb the area over the joint with a local anesthetic, and use a needle to remove joint fluid for examination. This procedure is only minimally uncomfortable. °· Even with these tests, your caregiver may not be able to tell what kind of arthritis you have. Consultation with a specialist (rheumatologist) may be helpful. °TREATMENT  °Your caregiver will discuss with you treatment specific to your type of arthritis. If the specific type cannot be determined, then the following general recommendations may apply. °Treatment of severe joint pain includes: °· Rest. °· Elevation. °· Anti-inflammatory medication (for example, ibuprofen) may be prescribed. Avoiding activities that cause increased pain. °· Only take over-the-counter or prescription medicines for pain and discomfort as recommended by your caregiver. °· Cold packs over an inflamed joint may be used for 10 to 15 minutes every hour. Hot packs sometimes feel better, but do not use overnight. Do not use hot packs if you are diabetic without your caregiver's permission. °· A cortisone shot into arthritic joints may help reduce pain and swelling. °· Any acute arthritis that gets worse over the next 1 to 2 days needs to be looked at to be sure there is no joint infection. °Long-term arthritis treatment involves modifying activities and lifestyle to reduce joint stress jarring. This can include weight loss. Also, exercise is needed to nourish the joint cartilage and remove waste. This helps keep the muscles   around the joint strong. °HOME CARE INSTRUCTIONS  °· Do not take aspirin to relieve pain if gout is suspected. This elevates uric acid levels. °· Only take over-the-counter or prescription medicines for pain, discomfort or fever as directed by your caregiver. °· Rest the joint as much as  possible. °· If your joint is swollen, keep it elevated. °· Use crutches if the painful joint is in your leg. °· Drinking plenty of fluids may help for certain types of arthritis. °· Follow your caregiver's dietary instructions. °· Try low-impact exercise such as: °¨ Swimming. °¨ Water aerobics. °¨ Biking. °¨ Walking. °· Morning stiffness is often relieved by a warm shower. °· Put your joints through regular range-of-motion. °SEEK MEDICAL CARE IF:  °· You do not feel better in 24 hours or are getting worse. °· You have side effects to medications, or are not getting better with treatment. °SEEK IMMEDIATE MEDICAL CARE IF:  °· You have a fever. °· You develop severe joint pain, swelling or redness. °· Many joints are involved and become painful and swollen. °· There is severe back pain and/or leg weakness. °· You have loss of bowel or bladder control. °Document Released: 04/12/2004 Document Revised: 05/28/2011 Document Reviewed: 04/28/2008 °Exit[employer]® Patient Information ©2015 [employer]. This information is not intended to replace advice given to you by your health care provider. Make sure you discuss any questions you have with your health care provider. ° °

## 2014-04-17 NOTE — ED Notes (Signed)
Pt reports left knee pain for the past couple of days. States that she has been dx with arthritis in that knee, but started having increased pain today and numbness in the knee and lower leg.

## 2014-04-17 NOTE — ED Provider Notes (Signed)
CSN: [id number]     Arrival date & time 04/17/14  1657 History   First MD Initiated Contact with Patient 04/17/14 1759     Chief Complaint  Patient presents with  . Knee Pain  . Numbness   HPI The patient presents to the emergency room with complaints of sharp severe left knee pain.  Patient was seen last month for similar complaints. She had an x-ray of her knee that showed mild osteoarthritis. Patient says that in the last few days the pain has become more severe. Pain is sharp in the front and back part of her left knee. It increases when she tries to walk and stand. She also feels like she is having some pain in her right calf as well as some numbness in that area. She denies any back pain or sharp pain radiating down her leg. She denies any trouble with chest pain or shortness of breath. Patient has not seen anyone else since her prior visit. She came in today because of the worsening pain. Past Medical History  Diagnosis Date  . Obesity   . Renal disorder    Past Surgical History  Procedure Laterality Date  . Oophorectomy    . Cesarean section    . Cholecystectomy    . Hernia repair     History reviewed. No pertinent family history. History  Substance Use Topics  . Smoking status: Never Smoker   . Smokeless tobacco: Not on file  . Alcohol Use: No   OB History    No data available     Review of Systems  All other systems reviewed and are negative.     Allergies  Review of patient's allergies indicates no known allergies.  Home Medications   Prior to Admission medications   Medication Sig Start Date End Date Taking? Authorizing Provider  cephALEXin (KEFLEX) 500 MG capsule Take 1 capsule (500 mg total) by mouth 4 (four) times daily. 10/13/13   Emilia BeckKaitlyn Szekalski, PA-C  HYDROcodone-acetaminophen (NORCO/VICODIN) 5-325 MG per tablet Take 2 tablets by mouth every 4 (four) hours as needed for moderate pain or severe pain. 10/19/13   Renne CriglerJoshua Geiple, PA-C  ibuprofen  (ADVIL,MOTRIN) 200 MG tablet Take 400 mg by mouth every 6 (six) hours as needed for headache or moderate pain.    Historical Provider, MD  sulfamethoxazole-trimethoprim (BACTRIM DS,SEPTRA DS) 800-160 MG per tablet Take 2 tablets by mouth 2 (two) times daily. 10/19/13   Renne CriglerJoshua Geiple, PA-C  traMADol (ULTRAM) 50 MG tablet Take 1 tablet (50 mg total) by mouth every 6 (six) hours as needed. 03/13/14   Teressa LowerVrinda Pickering, NP   BP 122/93 mmHg  Pulse 105  Temp(Src) 98.3 F (36.8 C) (Oral)  Resp 18  Ht 5\' 4"  (1.626 m)  Wt 310 lb (140.615 kg)  BMI 53.19 kg/m2  SpO2 97%  LMP 04/16/2014 Physical Exam  Constitutional: She appears well-developed and well-nourished. No distress.  Morbidly obese  HENT:  Head: Normocephalic and atraumatic.  Right Ear: External ear normal.  Left Ear: External ear normal.  Eyes: Conjunctivae are normal. Right eye exhibits no discharge. Left eye exhibits no discharge. No scleral icterus.  Neck: Neck supple. No tracheal deviation present.  Cardiovascular: Normal rate and regular rhythm.   Pulmonary/Chest: Effort normal and breath sounds normal. No stridor. No respiratory distress.  Musculoskeletal: She exhibits no edema.       Left knee: She exhibits bony tenderness. She exhibits no swelling, no effusion, no deformity, no laceration and no erythema. Tenderness  found. No medial joint line and no lateral joint line tenderness noted.       Left lower leg: She exhibits tenderness. She exhibits no swelling, no edema and no deformity.  Neurological: She is alert. Cranial nerve deficit: no gross deficits.  Skin: Skin is warm and dry. No rash noted.  Psychiatric: She has a normal mood and affect.  Nursing note and vitals reviewed.   ED Course  Procedures (including critical care time) Doppler negative  MDM   Final diagnoses:  Knee pain, left    Doppler US was negative for DVT.  No vascular compromise on exam.  No sign of infection.  Pt is obese.  This could be  contributing to some issues with the arthritis and knee pain. Dc home ortho follow up.  Crutches for support    Linwood Dibbles, MD 04/17/14 [phone]

## 2014-04-17 NOTE — ED Notes (Signed)
Vascular at bedside

## 2014-10-29 ENCOUNTER — Emergency Department (HOSPITAL_COMMUNITY)
Admission: EM | Admit: 2014-10-29 | Discharge: 2014-10-29 | Disposition: A | Discharge status: Home / Self Care | Payer: Medicaid Other | Attending: Emergency Medicine | Admitting: Emergency Medicine

## 2014-10-29 ENCOUNTER — Encounter (HOSPITAL_COMMUNITY): Payer: Self-pay | Admitting: Emergency Medicine

## 2014-10-29 DIAGNOSIS — R109 Unspecified abdominal pain: Secondary | ICD-10-CM | POA: Diagnosis present

## 2014-10-29 DIAGNOSIS — Z79899 Other long term (current) drug therapy: Secondary | ICD-10-CM | POA: Insufficient documentation

## 2014-10-29 DIAGNOSIS — E669 Obesity, unspecified: Secondary | ICD-10-CM | POA: Insufficient documentation

## 2014-10-29 DIAGNOSIS — R1084 Generalized abdominal pain: Secondary | ICD-10-CM

## 2014-10-29 DIAGNOSIS — Z87448 Personal history of other diseases of urinary system: Secondary | ICD-10-CM | POA: Diagnosis not present

## 2014-10-29 LAB — COMPREHENSIVE METABOLIC PANEL
ALK PHOS: 71 U/L (ref 38–126)
ALT: 18 U/L (ref 14–54)
ANION GAP: 11 (ref 5–15)
AST: 20 U/L (ref 15–41)
Albumin: 3.2 g/dL — ABNORMAL LOW (ref 3.5–5.0)
BILIRUBIN TOTAL: 0.4 mg/dL (ref 0.3–1.2)
BUN: 8 mg/dL (ref 6–20)
CO2: 26 mmol/L (ref 22–32)
Calcium: 9.2 mg/dL (ref 8.9–10.3)
Chloride: 101 mmol/L (ref 101–111)
Creatinine, Ser: 0.63 mg/dL (ref 0.44–1.00)
Glucose, Bld: 223 mg/dL — ABNORMAL HIGH (ref 65–99)
POTASSIUM: 3.7 mmol/L (ref 3.5–5.1)
Sodium: 138 mmol/L (ref 135–145)
Total Protein: 7.6 g/dL (ref 6.5–8.1)

## 2014-10-29 LAB — URINALYSIS, ROUTINE W REFLEX MICROSCOPIC
Bilirubin Urine: NEGATIVE
GLUCOSE, UA: 100 mg/dL — AB
Hgb urine dipstick: NEGATIVE
Ketones, ur: NEGATIVE mg/dL
Leukocytes, UA: NEGATIVE
Nitrite: NEGATIVE
Protein, ur: NEGATIVE mg/dL
SPECIFIC GRAVITY, URINE: 1.021 (ref 1.005–1.030)
UROBILINOGEN UA: 0.2 mg/dL (ref 0.0–1.0)
pH: 5 (ref 5.0–8.0)

## 2014-10-29 LAB — HCG, QUANTITATIVE, PREGNANCY: hCG, Beta Chain, Quant, S: <1 m[IU]/mL (ref <5)

## 2014-10-29 LAB — CBC
HCT: 34 % — ABNORMAL LOW (ref 36.0–46.0)
Hemoglobin: 9.9 g/dL — ABNORMAL LOW (ref 12.0–15.0)
MCH: 20 pg — AB (ref 26.0–34.0)
MCHC: 29.1 g/dL — ABNORMAL LOW (ref 30.0–36.0)
MCV: 68.8 fL — AB (ref 78.0–100.0)
Platelets: 378 10*3/uL (ref 150–400)
RBC: 4.94 MIL/uL (ref 3.87–5.11)
RDW: 19.2 % — AB (ref 11.5–15.5)
WBC: 8.4 10*3/uL (ref 4.0–10.5)

## 2014-10-29 LAB — LIPASE, BLOOD: LIPASE: 22 U/L (ref 22–51)

## 2014-10-29 MED ORDER — PANTOPRAZOLE SODIUM 20 MG PO TBEC: 20.0000 mg | DELAYED_RELEASE_TABLET | Freq: Every day | ORAL | Status: DC

## 2014-10-29 MED ORDER — OXYCODONE-ACETAMINOPHEN 5-325 MG PO TABS
1.0000 | ORAL_TABLET | Freq: Once | ORAL | Status: AC
  Administered 2014-10-29: 1 via ORAL
  Filled 2014-10-29: qty 1

## 2014-10-29 MED ORDER — ONDANSETRON HCL 4 MG PO TABS: 4.0000 mg | ORAL_TABLET | Freq: Four times a day (QID) | ORAL | Status: DC

## 2014-10-29 MED ORDER — ONDANSETRON 4 MG PO TBDP
4.0000 mg | ORAL_TABLET | Freq: Once | ORAL | Status: AC
  Administered 2014-10-29: 4 mg via ORAL
  Filled 2014-10-29: qty 1

## 2014-10-29 NOTE — ED Notes (Signed)
Patient is alert and oriented at discharge.  Patient is ambulatory to the waiting room with EMT.

## 2014-10-29 NOTE — Discharge Instructions (Signed)
Return to the ED with any concerns including vomiting and not able to keep down liquids, worsening abdominal pain, decreased level of alertness/lethargy, or any other alarming symptoms °

## 2014-10-29 NOTE — ED Notes (Signed)
Patient states abdominal pain that started 2 days ago.  Patient states some nausea, but no vomiting.  Patient states she did take advil, but didn't help.   Patient states she hurts throughout the abdomen and some flank pain.  Denies other symptoms.

## 2014-10-29 NOTE — ED Provider Notes (Signed)
CSN: [id number]     Arrival date & time 10/29/14  1148 History   First MD Initiated Contact with Patient 10/29/14 1203     Chief Complaint  Patient presents with  . Abdominal Pain     (Consider location/radiation/quality/duration/timing/severity/associated sxs/prior Treatment) HPI  Pt presenting with c/o abdominal pain.  She states pain is worse in her upper stomach and on the right side.  No vomiting but has had nausea.  Pain has been present for the past 2 days.  No fever/chills.  No changes in bowel movements.  No dysuria/ugency or frequency.  She noted when she reached over to the left her right upper abdomen felt like a cramp.  She has not had any treatment prior to arrival.  Also c/o some right side/anterior flank pain.  She states pain is sore ness and feels like cramping. There are no other associated systemic symptoms, there are no other alleviating or modifying factors.   Past Medical History  Diagnosis Date  . Obesity   . Renal disorder    Past Surgical History  Procedure Laterality Date  . Oophorectomy    . Cesarean section    . Cholecystectomy    . Hernia repair     No family history on file. Social History  Substance Use Topics  . Smoking status: Never Smoker   . Smokeless tobacco: None  . Alcohol Use: No   OB History    No data available     Review of Systems  ROS reviewed and all otherwise negative except for mentioned in HPI    Allergies  Review of patient's allergies indicates no known allergies.  Home Medications   Prior to Admission medications   Medication Sig Start Date End Date Taking? Authorizing Provider  ibuprofen (ADVIL,MOTRIN) 200 MG tablet Take 400 mg by mouth every 6 (six) hours as needed for headache or moderate pain.   Yes Historical Provider, MD  HYDROcodone-acetaminophen (NORCO/VICODIN) 5-325 MG per tablet Take 1-2 tablets by mouth every 4 (four) hours as needed. Patient not taking: Reported on 10/29/2014 04/17/14   Linwood Dibbles, MD   ondansetron (ZOFRAN) 4 MG tablet Take 1 tablet (4 mg total) by mouth every 6 (six) hours. 10/29/14   Jerelyn Scott, MD  pantoprazole (PROTONIX) 20 MG tablet Take 1 tablet (20 mg total) by mouth daily. 10/29/14   Jerelyn Scott, MD  sulfamethoxazole-trimethoprim (BACTRIM DS,SEPTRA DS) 800-160 MG per tablet Take 2 tablets by mouth 2 (two) times daily. Patient not taking: Reported on 10/29/2014 10/19/13   Renne Crigler, PA-C  traMADol (ULTRAM) 50 MG tablet Take 1 tablet (50 mg total) by mouth every 6 (six) hours as needed. Patient not taking: Reported on 10/29/2014 03/13/14   Teressa Lower, NP   BP 117/79 mmHg  Pulse 92  Temp(Src) 98.1 F (36.7 C) (Oral)  Resp 18  Ht 5\' 4"  (1.626 m)  Wt 310 lb (140.615 kg)  BMI 53.19 kg/m2  SpO2 95%  LMP 10/15/2014  Vitals reviewed Physical Exam  Physical Examination: General appearance - alert, well appearing, and in no distress Mental status - alert, oriented to person, place, and time Eyes - no conjunctival injection, no scleral icterus Mouth - mucous membranes moist, pharynx normal without lesions Chest - clear to auscultation, no wheezes, rales or rhonchi, symmetric air entry Heart - normal rate, regular rhythm, normal S1, S2, no murmurs, rubs, clicks or gallops Abdomen - soft, mild diffuse tenderness to palpation, no gaurding or rebound tenderness, nabs, nondistended, no masses or organomegaly  Neurological - alert, oriented Extremities - peripheral pulses normal, no pedal edema, no clubbing or cyanosis Skin - normal coloration and turgor, no rashes  ED Course  Procedures (including critical care time) Labs Review Labs Reviewed  COMPREHENSIVE METABOLIC PANEL - Abnormal; Notable for the following:    Glucose, Bld 223 (*)    Albumin 3.2 (*)    All other components within normal limits  CBC - Abnormal; Notable for the following:    Hemoglobin 9.9 (*)    HCT 34.0 (*)    MCV 68.8 (*)    MCH 20.0 (*)    MCHC 29.1 (*)    RDW 19.2 (*)    All  other components within normal limits  URINALYSIS, ROUTINE W REFLEX MICROSCOPIC (NOT AT [facility]) - Abnormal; Notable for the following:    APPearance HAZY (*)    Glucose, UA 100 (*)    All other components within normal limits  LIPASE, BLOOD  HCG, QUANTITATIVE, PREGNANCY    Imaging Review No results found. Julious Payer, personally reviewed and evaluated these images and lab results as part of my medical decision-making.   EKG Interpretation None      MDM   Final diagnoses:  Generalized abdominal pain    Pt presenting with c/o abdominal pain, labs and urine are reassuring.  No focal tenderness on exam, no peritoneal signs.  Advised patient to arrange for f/u with PMD.  Given rx for zofran and will start on protonix.  Discharged with strict return precautions.  Pt agreeable with plan.    Jerelyn Scott, MD 10/29/14 [phone]

## 2014-11-01 ENCOUNTER — Encounter (HOSPITAL_COMMUNITY): Payer: Self-pay | Admitting: *Deleted

## 2014-11-01 ENCOUNTER — Emergency Department (HOSPITAL_COMMUNITY)
Admission: EM | Admit: 2014-11-01 | Discharge: 2014-11-01 | Disposition: A | Discharge status: Home / Self Care | Payer: Medicaid Other | Attending: Emergency Medicine | Admitting: Emergency Medicine

## 2014-11-01 DIAGNOSIS — N939 Abnormal uterine and vaginal bleeding, unspecified: Secondary | ICD-10-CM | POA: Diagnosis present

## 2014-11-01 DIAGNOSIS — E669 Obesity, unspecified: Secondary | ICD-10-CM | POA: Diagnosis not present

## 2014-11-01 DIAGNOSIS — N920 Excessive and frequent menstruation with regular cycle: Secondary | ICD-10-CM | POA: Insufficient documentation

## 2014-11-01 DIAGNOSIS — Z87448 Personal history of other diseases of urinary system: Secondary | ICD-10-CM | POA: Insufficient documentation

## 2014-11-01 LAB — TYPE AND SCREEN
ABO/RH(D): O POS
ANTIBODY SCREEN: NEGATIVE

## 2014-11-01 LAB — CBC
HCT: 33 % — ABNORMAL LOW (ref 36.0–46.0)
HEMOGLOBIN: 9.6 g/dL — AB (ref 12.0–15.0)
MCH: 19.7 pg — ABNORMAL LOW (ref 26.0–34.0)
MCHC: 29.1 g/dL — ABNORMAL LOW (ref 30.0–36.0)
MCV: 67.8 fL — AB (ref 78.0–100.0)
Platelets: 383 10*3/uL (ref 150–400)
RBC: 4.87 MIL/uL (ref 3.87–5.11)
RDW: 19.1 % — AB (ref 11.5–15.5)
WBC: 9.7 10*3/uL (ref 4.0–10.5)

## 2014-11-01 LAB — COMPREHENSIVE METABOLIC PANEL
ALT: 17 U/L (ref 14–54)
ANION GAP: 12 (ref 5–15)
AST: 24 U/L (ref 15–41)
Albumin: 3.4 g/dL — ABNORMAL LOW (ref 3.5–5.0)
Alkaline Phosphatase: 74 U/L (ref 38–126)
BILIRUBIN TOTAL: 0.5 mg/dL (ref 0.3–1.2)
BUN: <5 mg/dL — ABNORMAL LOW (ref 6–20)
CO2: 26 mmol/L (ref 22–32)
Calcium: 8.8 mg/dL — ABNORMAL LOW (ref 8.9–10.3)
Chloride: 99 mmol/L — ABNORMAL LOW (ref 101–111)
Creatinine, Ser: 0.68 mg/dL (ref 0.44–1.00)
Glucose, Bld: 154 mg/dL — ABNORMAL HIGH (ref 65–99)
Potassium: 3.5 mmol/L (ref 3.5–5.1)
Sodium: 137 mmol/L (ref 135–145)
TOTAL PROTEIN: 7.6 g/dL (ref 6.5–8.1)

## 2014-11-01 LAB — I-STAT BETA HCG BLOOD, ED (MC, WL, AP ONLY): I-stat hCG, quantitative: <5 m[IU]/mL (ref <5)

## 2014-11-01 LAB — LIPASE, BLOOD: Lipase: 16 U/L — ABNORMAL LOW (ref 22–51)

## 2014-11-01 LAB — ABO/RH: ABO/RH(D): O POS

## 2014-11-01 MED ORDER — MEGESTROL ACETATE 40 MG PO TABS: 80.0000 mg | ORAL_TABLET | Freq: Two times a day (BID) | ORAL | Status: DC

## 2014-11-01 NOTE — ED Notes (Signed)
Pt reports heavy vaginal bleeding since yesterday. Having lower abd pain. Pt reports going through 20+ pads in past 24 hours and passing large clots.

## 2014-11-01 NOTE — ED Provider Notes (Signed)
History   Chief Complaint  Patient presents with  . Vaginal Bleeding    HPI 49 year old female past mental history as below presents to ED for evaluation of vaginal bleeding. Patient reports bleeding has been ongoing for the past one day. She notes having a history of very heavy bleeds in the past. She states bleeding during this episode has been heavier than normal however. She reports using 20+ pads over the last 24 hours. She estimates she has been using a pad once every 20 minutes today. She denies any lightheadedness, syncope, chest pain, shortness of breath or other symptoms. Patient was recently seen in the ED on 8/12 for abdominal discomfort where she received labs which are reassuring and sent home. Patient states since then she has had crampy abdominal pain rated as mild and says her symptoms are improving. Patient states her period today is during her normal cycle. She denies ever having any abnormal Pap smears. She states she has not seen OB/GYN for several years. Denies being on any blood thinners. No other complaints at this time.  Past medical/surgical history, social history, medications, allergies and FH have been reviewed with patient and/or in documentation. Furthermore, if pt family or friend(s) present, additional historical information was obtained from them.  Past Medical History  Diagnosis Date  . Obesity   . Renal disorder    Past Surgical History  Procedure Laterality Date  . Oophorectomy    . Cesarean section    . Cholecystectomy    . Hernia repair     History reviewed. No pertinent family history. Social History  Substance Use Topics  . Smoking status: Never Smoker   . Smokeless tobacco: None  . Alcohol Use: No     Review of Systems Constitutional: - F/C, -fatigue.  HENT: - congestion, -rhinorrhea, -sore throat.   Eyes: - eye pain, -visual disturbance.  Respiratory: - cough, -SOB, -hemoptysis.   Cardiovascular: - CP, -palps.  Gastrointestinal: -  N/V/D, -abd pain  Genitourinary: - flank pain, -dysuria, -frequency.  Musculoskeletal: - myalgia/arthritis, -joint swelling, -gait abnormality, -back pain, -neck pain/stiffness, -leg pain/swelling.  Skin: - rash/lesion.  Neurological: - focal weakness, -lightheadedness, -dizziness, -numbness, -HA.  All other systems reviewed and are negative.   Physical Exam  Physical Exam  ED Triage Vitals  Enc Vitals Group     BP 11/01/14 1525 123/86 mmHg     Pulse Rate 11/01/14 1525 110     Resp 11/01/14 1525 18     Temp 11/01/14 1525 98.2 F (36.8 C)     Temp Source 11/01/14 1525 Oral     SpO2 11/01/14 1525 96 %     Weight --      Height --      Head Cir --      Peak Flow --      Pain Score 11/01/14 1523 5     Pain Loc --      Pain Edu? --      Excl. in GC? --    Constitutional: Morbidly obese appearing female in no apparent distress Head: Normocephalic and atraumatic.  Eyes: Extraocular motion intact, no scleral icterus Mouth: MMM, OP clear Neck: Supple without meningismus, mass, or overt JVD Respiratory: No respiratory distress. Normal WOB. No w/r/g. CV: RRR, no obvious murmurs.  Pulses +2 and symmetric. Euvolemic.  Abdomen: Soft, NT, ND, no r/g. No mass.  GU: There is bleeding on exam today without signs of trauma or other obvious abnormality. Cervix is without friability or lesion.  No vaginal discharge. No uterine/adnexal tenderness or fullness. There is a small ooze of blood per os which is controlled with swab. MSK: Extremities are atraumatic without deformity, ROM intact Skin: Warm, dry, intact without rash. Capillary refill is brisk Neuro: AAOx4, MAE 5/5 sym, no focal deficit noted   ED Course  Procedures   Labs Reviewed  LIPASE, BLOOD - Abnormal; Notable for the following:    Lipase 16 (*)    All other components within normal limits  COMPREHENSIVE METABOLIC PANEL - Abnormal; Notable for the following:    Chloride 99 (*)    Glucose, Bld 154 (*)    BUN <5 (*)     Calcium 8.8 (*)    Albumin 3.4 (*)    All other components within normal limits  CBC - Abnormal; Notable for the following:    Hemoglobin 9.6 (*)    HCT 33.0 (*)    MCV 67.8 (*)    MCH 19.7 (*)    MCHC 29.1 (*)    RDW 19.1 (*)    All other components within normal limits  I-STAT BETA HCG BLOOD, ED ([facility], [facility], [facility] ONLY)  TYPE AND SCREEN   I personally reviewed and interpreted all labs.  No results found. I personally viewed above image(s) which were used in my medical decision making. Formal interpretations by Radiology.   EKG Interpretation  Date/Time:    Ventricular Rate:    PR Interval:    QRS Duration:   QT Interval:    QTC Calculation:   R Axis:     Text Interpretation:         MDM: Rita Bass is a 49 y.o. female with H&P as above who p/w CC: VB  Patient is hemodynamically stable and in no apparent distress on arrival. Benign exam as above. Patient's labs are stable today. [facility] and [facility] ultrasound and starting Megace and they will follow-up with patient in the next few days. Patient is agreeable to this plan and we reviewed strict return precautions at length and she voiced understanding. Patient stable for discharge.  Old records reviewed (if available). Labs and imaging reviewed personally by myself and considered in medical decision making if ordered.  Clinical Impression: 1. Menorrhagia with regular cycle     Disposition: Discharge  Condition: Good  I have discussed the results, Dx and Tx plan with the pt(& family if present). He/she/they expressed understanding and agree(s) with the plan. Discharge instructions discussed at great length. Strict return precautions discussed and pt &/or family have verbalized understanding of the instructions. No further questions at time of discharge.    New Prescriptions   MEGESTROL (MEGACE) 40 MG TABLET    Take 2 tablets (80 mg total) by mouth 2 (two) times daily.     Follow Up: [facility] [address] [place] [address] [phone]  You will receive a call regarding follow up in next 2-3 days. If you have not received call in this time, call above.  [facility] EMERGENCY DEPARTMENT [address] [id number] mc [place] [address] [phone]  If symptoms worsen   Pt seen in conjunction with Dr. Benjiman Core, MD  Ames Dura, DO [facility] Emergency Medicine Resident - PGY-3     Ames Dura, MD 11/01/14 1720  Benjiman Core, MD 11/02/14 [phone]

## 2014-11-01 NOTE — Discharge Instructions (Signed)
Abnormal Uterine Bleeding Abnormal uterine bleeding can affect women at various stages in life, including teenagers, women in their reproductive years, pregnant women, and women who have reached menopause. Several kinds of uterine bleeding are considered abnormal, including:  Bleeding or spotting between periods.   Bleeding after sexual intercourse.   Bleeding that is heavier or more than normal.   Periods that last longer than usual.  Bleeding after menopause.  Many cases of abnormal uterine bleeding are minor and simple to treat, while others are more serious. Any type of abnormal bleeding should be evaluated by your health care provider. Treatment will depend on the cause of the bleeding. HOME CARE INSTRUCTIONS Monitor your condition for any changes. The following actions may help to alleviate any discomfort you are experiencing:  Avoid the use of tampons and douches as directed by your health care provider.  Change your pads frequently. You should get regular pelvic exams and Pap tests. Keep all follow-up appointments for diagnostic tests as directed by your health care provider.  SEEK MEDICAL CARE IF:   Your bleeding lasts more than 1 week.   You feel dizzy at times.  SEEK IMMEDIATE MEDICAL CARE IF:   You pass out.   You are changing pads every 15 to 30 minutes.   You have abdominal pain.  You have a fever.   You become sweaty or weak.   You are passing large blood clots from the vagina.   You start to feel nauseous and vomit. MAKE SURE YOU:   Understand these instructions.  Will watch your condition.  Will get help right away if you are not doing well or get worse. Document Released: 03/05/2005 Document Revised: 03/10/2013 Document Reviewed: 10/02/2012 ExitCare Patient Information 2015 [employer]. This information is not intended to replace advice given to you by your health care provider. Make sure you discuss any questions you have with your  health care provider.  

## 2014-11-09 ENCOUNTER — Ambulatory Visit (HOSPITAL_COMMUNITY)
Admission: RE | Admit: 2014-11-09 | Discharge: 2014-11-09 | Disposition: A | Discharge status: Home / Self Care | Payer: Medicaid Other | Source: Ambulatory Visit | Attending: Emergency Medicine | Admitting: Emergency Medicine

## 2014-11-09 DIAGNOSIS — R938 Abnormal findings on diagnostic imaging of other specified body structures: Secondary | ICD-10-CM | POA: Insufficient documentation

## 2014-11-09 DIAGNOSIS — N938 Other specified abnormal uterine and vaginal bleeding: Secondary | ICD-10-CM | POA: Insufficient documentation

## 2014-11-10 ENCOUNTER — Telehealth: Payer: Self-pay | Admitting: *Deleted

## 2014-11-10 NOTE — Telephone Encounter (Signed)
Zyria called and left a message this am requesting results of ultrasound done yesterday.  Per chart review not seen in clinic yet, but has appointment 11/18/14.seen in ED and referred to clinic

## 2014-11-11 NOTE — Telephone Encounter (Signed)
Patient called and left message stating she was on the phone with a nurse and the call was disconnected and requests call back at [phone]

## 2014-11-11 NOTE — Telephone Encounter (Signed)
Patient called in to front office stating she was on the phone with [facility] yesterday and the call got connected. Patient is requesting her ultrasound results. Verified appt for next week with patient with Dr Debroah Loop to evaluate her bleeding. Discussed with patient that her ultrasound was relatively normal and that she possibly has a fibroid or polyp as well which could contribute to her bleeding but the doctor will discuss her results more in depth with her at her appt next week. Patient verbalized understanding and had no other questions

## 2014-11-17 ENCOUNTER — Encounter: Payer: Medicaid Other | Admitting: Obstetrics & Gynecology

## 2014-11-18 ENCOUNTER — Encounter: Payer: Self-pay | Admitting: Obstetrics & Gynecology

## 2014-11-18 ENCOUNTER — Ambulatory Visit (INDEPENDENT_AMBULATORY_CARE_PROVIDER_SITE_OTHER): Payer: Medicaid Other | Admitting: Obstetrics & Gynecology

## 2014-11-18 VITALS — BP 119/74 | HR 101 | Temp 98.4°F

## 2014-11-18 DIAGNOSIS — N920 Excessive and frequent menstruation with regular cycle: Secondary | ICD-10-CM

## 2014-11-18 MED ORDER — MEGESTROL ACETATE 40 MG PO TABS: 80.0000 mg | ORAL_TABLET | Freq: Two times a day (BID) | ORAL | Status: DC

## 2014-11-18 NOTE — Patient Instructions (Signed)

## 2014-11-18 NOTE — Progress Notes (Signed)
Patient ID: Rita Bass, female   DOB: [birth year], 49 y.o.   MRN: [id number]  Chief Complaint  Patient presents with  . Follow-up  had heavy bleeding with last period and went to ER  HPI Rita Bass is a 49 y.o. female.  [id number] Patient's last menstrual period was 11/01/2014 (exact date). She went to [facility] for heavy bleeding 2 weeks ago. Stopped bleeding after Megace was Rx. US showed possible polyp  HPI  Past Medical History  Diagnosis Date  . Obesity   . Renal disorder     Past Surgical History  Procedure Laterality Date  . Oophorectomy    . Cesarean section    . Cholecystectomy    . Hernia repair    BTL at last CS  History reviewed. No pertinent family history.  Social History Social History  Substance Use Topics  . Smoking status: Never Smoker   . Smokeless tobacco: None  . Alcohol Use: No    No Known Allergies  Current Outpatient Prescriptions  Medication Sig Dispense Refill  . ibuprofen (ADVIL,MOTRIN) 200 MG tablet Take 400 mg by mouth every 6 (six) hours as needed for headache or moderate pain.    Marland Kitchen ondansetron (ZOFRAN) 4 MG tablet Take 1 tablet (4 mg total) by mouth every 6 (six) hours. 12 tablet 0  . pantoprazole (PROTONIX) 20 MG tablet Take 1 tablet (20 mg total) by mouth daily. 30 tablet 0  . megestrol (MEGACE) 40 MG tablet Take 2 tablets (80 mg total) by mouth 2 (two) times daily. 60 tablet 2   No current facility-administered medications for this visit.    Review of Systems Review of Systems  Constitutional: Negative.   Gastrointestinal: Negative.   Genitourinary: Positive for menstrual problem and pelvic pain (cramps). Negative for vaginal bleeding and vaginal discharge.    Blood pressure 119/74, pulse 101, temperature 98.4 F (36.9 C), last menstrual period 11/01/2014.  Physical Exam Physical Exam  Constitutional: She is oriented to person, place, and time. She appears well-developed. No distress.  obese  Pulmonary/Chest: Effort  normal.  Neurological: She is alert and oriented to person, place, and time.  Skin: Skin is warm.  Psychiatric: She has a normal mood and affect. Her behavior is normal.    Data Reviewed CLINICAL DATA: Dysfunctional uterine bleeding. Vaginal bleeding for 2 weeks. Previous history of a C-section.  EXAM: TRANSABDOMINAL AND TRANSVAGINAL ULTRASOUND OF PELVIS  TECHNIQUE: Both transabdominal and transvaginal ultrasound examinations of the pelvis were performed. Transabdominal technique was performed for global imaging of the pelvis including uterus, ovaries, adnexal regions, and pelvic cul-de-sac. It was necessary to proceed with endovaginal exam following the transabdominal exam to visualize the uterus and endometrium to better advantage.  COMPARISON: CT, 08/28/2011  FINDINGS: Uterus  Measurements: 8.8 x 5.1 x 5.8 cm. No fibroids or other mass visualized. Multiple cervical nabothian cysts.  Endometrium  Thickness: 9.2 mm overall. There is a focal endometrial mass or focal thickening of the endometrium in the upper uterine segment measuring approximate 2.3 x 2.9 x 2.3 cm. This has a small amount of vascularity. Question a polyp versus a submucosal fibroid versus possible endometrial carcinoma.  Right ovary  Not visualized. No adnexal masses.  Left ovary  Not visualized. No adnexal masses.  Other findings  No free fluid.  IMPRESSION: 1. Focal area of endo me thickening versus an endometrial mass or polyp in the upper uterine segment. This measures 2.9 cm in greatest dimension. Overall endometrial thickness is 9.2 mm. Consider sonohysterogram forfurther evaluation,  prior to hysteroscopy or endometrial biopsy. 2. No other uterine abnormality. 3. Neither ovary visualized. No adnexal masses. Study limited by patient's body habitus.   Electronically Signed  By: Amie Portland M.D.  On: 11/09/2014 16:42      Result History    CBC     Component Value Date/Time   WBC 9.7 11/01/2014 1540   RBC 4.87 11/01/2014 1540   HGB 9.6* 11/01/2014 1540   HCT 33.0* 11/01/2014 1540   PLT 383 11/01/2014 1540   MCV 67.8* 11/01/2014 1540   MCH 19.7* 11/01/2014 1540   MCHC 29.1* 11/01/2014 1540   RDW 19.1* 11/01/2014 1540   LYMPHSABS 2.2 11/20/2013 1706   MONOABS 0.6 11/20/2013 1706   EOSABS 0.4 11/20/2013 1706   BASOSABS 0.0 11/20/2013 1706      Assessment    Menorrhagia, possible endometrial polyp on US Obesity      Plan    Financial application, consider Mirena May need SHG to evaluate endometrium Megace 40 mg BID        Glenn Christo 11/18/2014, 3:07 PM

## 2014-12-14 ENCOUNTER — Telehealth: Payer: Self-pay | Admitting: *Deleted

## 2014-12-14 DIAGNOSIS — N938 Other specified abnormal uterine and vaginal bleeding: Secondary | ICD-10-CM

## 2014-12-14 NOTE — Telephone Encounter (Signed)
Received message left on nurse line 12/14/14 at 1517.  Patient states she was supposed to have medication order sent to [facility] on [address] and they don't have it.  York Spaniel it has been a couple of weeks and she wanted to check on it.  Also states she was supposed to be called about an appointment for a free mammogram with the scholarship program.  She hasn't heard anything and is wanting to check on this also.

## 2014-12-16 ENCOUNTER — Other Ambulatory Visit: Payer: Self-pay

## 2014-12-16 DIAGNOSIS — Z1231 Encounter for screening mammogram for malignant neoplasm of breast: Secondary | ICD-10-CM

## 2014-12-16 MED ORDER — MEGESTROL ACETATE 40 MG PO TABS: 80.0000 mg | ORAL_TABLET | Freq: Two times a day (BID) | ORAL | Status: DC

## 2014-12-16 NOTE — Telephone Encounter (Signed)
Patient called in to front office stating her prescription never went to pharmacy and also she hasn't got a call about her mammogram. Patient was informed medication will be sent to her pharmacy and that the mammogram department is transferring to the breast center so there is a wait to get in for the mammogram, but they have her information. Patient verbalized understanding and had no questions

## 2014-12-23 ENCOUNTER — Ambulatory Visit: Payer: Self-pay

## 2014-12-28 ENCOUNTER — Ambulatory Visit
Admission: RE | Admit: 2014-12-28 | Discharge: 2014-12-28 | Disposition: A | Discharge status: Home / Self Care | Payer: No Typology Code available for payment source | Source: Ambulatory Visit

## 2014-12-28 DIAGNOSIS — Z1231 Encounter for screening mammogram for malignant neoplasm of breast: Secondary | ICD-10-CM

## 2014-12-29 ENCOUNTER — Telehealth: Payer: Self-pay | Admitting: *Deleted

## 2014-12-29 NOTE — Telephone Encounter (Signed)
Pt contacted the clinic requesting mammogram results from 12/28/14.  Reviewed chart, mammogram results have not been released.  Contacted patient, informed results have not been released to call back on 01/03/15.  Pt verbalizes understanding.

## 2014-12-30 ENCOUNTER — Other Ambulatory Visit: Payer: Self-pay | Admitting: Emergency Medicine

## 2014-12-30 DIAGNOSIS — R928 Other abnormal and inconclusive findings on diagnostic imaging of breast: Secondary | ICD-10-CM

## 2015-01-12 ENCOUNTER — Telehealth: Payer: Self-pay | Admitting: *Deleted

## 2015-01-12 NOTE — Telephone Encounter (Signed)
Called patient stating I am returning her phone call. Discussed with patient that the mammogram is only a screening tool, not diagnostic. Told patient the purpose of the ultrasound and diagnostic mammogram is to find out what exactly is there. Told patient that what they found could be as simple as a cyst. Patient verbalized understanding and states they also set her up with a [facility] appt the day before. Told patient that appointment is very important because it helps with the costs of the additional tests and also for a pap smear as well. Patient verbalized understanding and had no questions

## 2015-01-12 NOTE — Telephone Encounter (Signed)
Received a message left on nurse line on 01/12/15 at 1155.  Patient states she was [facility] and was called and told they found something on her left breast.  States they have scheduled her for another test at the Breast Center on 01/21/15.  States they wouldn't give her any specifics around what was found and encouraged her to contact us for results.  Requests a return call.

## 2015-01-20 ENCOUNTER — Ambulatory Visit (HOSPITAL_COMMUNITY)
Admission: RE | Admit: 2015-01-20 | Discharge: 2015-01-20 | Disposition: A | Discharge status: Home / Self Care | Payer: No Typology Code available for payment source | Source: Ambulatory Visit | Attending: Obstetrics and Gynecology | Admitting: Obstetrics and Gynecology

## 2015-01-20 ENCOUNTER — Other Ambulatory Visit: Payer: Self-pay | Admitting: Obstetrics & Gynecology

## 2015-01-20 ENCOUNTER — Encounter (HOSPITAL_COMMUNITY): Payer: Self-pay

## 2015-01-20 VITALS — BP 124/76 | Temp 97.8°F | Ht 64.0 in | Wt 350.0 lb

## 2015-01-20 DIAGNOSIS — Z01419 Encounter for gynecological examination (general) (routine) without abnormal findings: Secondary | ICD-10-CM

## 2015-01-20 DIAGNOSIS — R928 Other abnormal and inconclusive findings on diagnostic imaging of breast: Secondary | ICD-10-CM

## 2015-01-20 NOTE — Progress Notes (Signed)
[facility] due to recommending additional imaging of the left breast. Screening mamm[facility]2014 [facility]  Pap Smear: Completed Pap smear today. Last Pap smear was 5-6 years ago and normal per patient. Per patient has a history of an abnormal Pap smear 15-16 years ago that cryotherapy was completed for follow-up. Per patient all Pap smears since cryotherapy has been completed have been normal. Patient stated she has had at least 3 normal Pap smears since abnormal Pap smear. No Pap smear results in EPIC.  Physical exam: Breasts Breasts symmetrical. No skin abnormalities bilateral breasts. No nipple retraction bilateral breasts. No nipple discharge bilateral breasts. No lymphadenopathy. No lumps palpated bilateral breasts. [facility] breast diagnostic mammogram per recommendation. Appointment scheduled for Friday, January 21, 2015 at 0900.          Pelvic/Bimanual   Ext Genitalia No lesions, no swelling and no discharge observed on external genitalia. Redness observed in folds between external genitalia and thighs that looks yeast like in appearance. Suggested to patient to try some OTC Monistat and if doesn't resolve to follow up with her OBGYN.        Vagina Vagina pink and normal texture. No lesions or discharge observed in vagina.          Cervix Cervix is present. Cervix pink and of normal texture. No discharge observed.    Uterus Uterus is present and unable to palpate due to obesity.    Adnexae Bilateral ovaries present unable to palpate due to obesity.    Rectovaginal No rectal exam completed today since patient had no rectal complaints. No skin abnormalities observed on exam.

## 2015-01-20 NOTE — Patient Instructions (Addendum)
Educational materials on self breast awareness given. Explained to Rita Bass that  [facility] will cover Pap smears and HPV Typing every 5 years unless has a history of abnormal Pap smears. [facility] breast diagnostic mammogram per recommendation. Appointment scheduled for Friday, January 21, 2015 at 0900. Patient aware of appointment and will be there. Let patient know will follow up with her within the next couple weeks with results of Pap smear by phone. Rita HockingDebbie Bass verbalized understanding.  Amani Marseille, Kathaleen Maserhristine Poll, RN 1:40 PM

## 2015-01-21 ENCOUNTER — Other Ambulatory Visit: Payer: Self-pay | Admitting: Obstetrics and Gynecology

## 2015-01-21 ENCOUNTER — Other Ambulatory Visit: Payer: No Typology Code available for payment source

## 2015-01-21 DIAGNOSIS — R928 Other abnormal and inconclusive findings on diagnostic imaging of breast: Secondary | ICD-10-CM

## 2015-01-26 LAB — CYTOLOGY - PAP

## 2015-01-27 ENCOUNTER — Ambulatory Visit
Admission: RE | Admit: 2015-01-27 | Discharge: 2015-01-27 | Disposition: A | Discharge status: Home / Self Care | Payer: No Typology Code available for payment source | Source: Ambulatory Visit | Attending: Obstetrics and Gynecology | Admitting: Obstetrics and Gynecology

## 2015-01-27 DIAGNOSIS — R928 Other abnormal and inconclusive findings on diagnostic imaging of breast: Secondary | ICD-10-CM

## 2015-02-03 ENCOUNTER — Observation Stay (HOSPITAL_COMMUNITY)
Admission: EM | Admit: 2015-02-03 | Discharge: 2015-02-04 | Disposition: A | Discharge status: Home / Self Care | Payer: Medicaid Other | Attending: Internal Medicine | Admitting: Internal Medicine

## 2015-02-03 ENCOUNTER — Encounter (HOSPITAL_COMMUNITY): Payer: Self-pay

## 2015-02-03 ENCOUNTER — Emergency Department (HOSPITAL_COMMUNITY): Payer: Medicaid Other

## 2015-02-03 DIAGNOSIS — N920 Excessive and frequent menstruation with regular cycle: Secondary | ICD-10-CM | POA: Diagnosis not present

## 2015-02-03 DIAGNOSIS — J40 Bronchitis, not specified as acute or chronic: Secondary | ICD-10-CM | POA: Diagnosis present

## 2015-02-03 DIAGNOSIS — J209 Acute bronchitis, unspecified: Secondary | ICD-10-CM | POA: Diagnosis not present

## 2015-02-03 DIAGNOSIS — Z6841 Body Mass Index (BMI) 40.0 and over, adult: Secondary | ICD-10-CM | POA: Insufficient documentation

## 2015-02-03 DIAGNOSIS — R000 Tachycardia, unspecified: Secondary | ICD-10-CM | POA: Diagnosis present

## 2015-02-03 LAB — D-DIMER, QUANTITATIVE: D-Dimer, Quant: 0.56 ug/mL-FEU — ABNORMAL HIGH (ref 0.00–0.50)

## 2015-02-03 LAB — CBC
HCT: 32.4 % — ABNORMAL LOW (ref 36.0–46.0)
HEMOGLOBIN: 9.4 g/dL — AB (ref 12.0–15.0)
MCH: 19.6 pg — ABNORMAL LOW (ref 26.0–34.0)
MCHC: 29 g/dL — ABNORMAL LOW (ref 30.0–36.0)
MCV: 67.6 fL — ABNORMAL LOW (ref 78.0–100.0)
PLATELETS: 352 10*3/uL (ref 150–400)
RBC: 4.79 MIL/uL (ref 3.87–5.11)
RDW: 19 % — ABNORMAL HIGH (ref 11.5–15.5)
WBC: 11.4 10*3/uL — ABNORMAL HIGH (ref 4.0–10.5)

## 2015-02-03 LAB — BASIC METABOLIC PANEL
ANION GAP: 9 (ref 5–15)
BUN: 8 mg/dL (ref 6–20)
CALCIUM: 8.9 mg/dL (ref 8.9–10.3)
CO2: 25 mmol/L (ref 22–32)
CREATININE: 0.6 mg/dL (ref 0.44–1.00)
Chloride: 103 mmol/L (ref 101–111)
Glucose, Bld: 110 mg/dL — ABNORMAL HIGH (ref 65–99)
Potassium: 4.4 mmol/L (ref 3.5–5.1)
SODIUM: 137 mmol/L (ref 135–145)

## 2015-02-03 LAB — I-STAT TROPONIN, ED: TROPONIN I, POC: 0 ng/mL (ref 0.00–0.08)

## 2015-02-03 MED ORDER — PREDNISONE 20 MG PO TABS
60.0000 mg | ORAL_TABLET | Freq: Once | ORAL | Status: AC
  Administered 2015-02-03: 60 mg via ORAL
  Filled 2015-02-03: qty 3

## 2015-02-03 MED ORDER — ALBUTEROL SULFATE (2.5 MG/3ML) 0.083% IN NEBU
5.0000 mg | INHALATION_SOLUTION | Freq: Once | RESPIRATORY_TRACT | Status: AC
  Administered 2015-02-03: 2.5 mg via RESPIRATORY_TRACT
  Filled 2015-02-03: qty 6

## 2015-02-03 MED ORDER — ALBUTEROL SULFATE (2.5 MG/3ML) 0.083% IN NEBU
5.0000 mg | INHALATION_SOLUTION | Freq: Once | RESPIRATORY_TRACT | Status: AC
  Administered 2015-02-03: 5 mg via RESPIRATORY_TRACT
  Filled 2015-02-03: qty 6

## 2015-02-03 MED ORDER — IOHEXOL 350 MG/ML SOLN
100.0000 mL | Freq: Once | INTRAVENOUS | Status: AC | PRN
  Administered 2015-02-04: 80 mL via INTRAVENOUS

## 2015-02-03 MED ORDER — IPRATROPIUM BROMIDE 0.02 % IN SOLN
0.5000 mg | Freq: Once | RESPIRATORY_TRACT | Status: AC
  Administered 2015-02-03: 0.5 mg via RESPIRATORY_TRACT
  Filled 2015-02-03: qty 2.5

## 2015-02-03 NOTE — ED Notes (Addendum)
Pt here for "hard" dry unproductive cough onset one week ago with no relief from OTC meds. She states she hears a weird nose in her chest when she coughs and reports discomfort in her chest when she cough and she woke up this morning with chest pain, back pain and neck pain.

## 2015-02-03 NOTE — ED Provider Notes (Signed)
CSN: [id number]     Arrival date & time 02/03/15  1558 History   First MD Initiated Contact with Patient 02/03/15 1809     Chief Complaint  Patient presents with  . Cough  . Chest Pain     (Consider location/radiation/quality/duration/timing/severity/associated sxs/prior Treatment) HPI Comments: Patient here complaining of dry nonproductive cough 1 week without associated fever or chills. No pleuritic pain or leg swelling. Has noticed some wheezing. Using over-the-counter medications up relief. No vomiting or diarrhea. No neck pain or photophobia. Denies any anginal type chest pain. No prior history of CHF. Symptoms worse with exertion better with rest. No prior history of same  Patient is a 49 y.o. female presenting with cough and chest pain. The history is provided by the patient.  Cough Associated symptoms: chest pain   Chest Pain Associated symptoms: cough     Past Medical History  Diagnosis Date  . Obesity   . Renal disorder    Past Surgical History  Procedure Laterality Date  . Oophorectomy    . Cesarean section    . Cholecystectomy    . Hernia repair    . Tubal ligation     Family History  Problem Relation Age of Onset  . Breast cancer Mother    Social History  Substance Use Topics  . Smoking status: Never Smoker   . Smokeless tobacco: Never Used  . Alcohol Use: No   OB History    Gravida Para Term Preterm AB TAB SAB Ectopic Multiple Living   0 0 0 0 2 7     Review of Systems  Respiratory: Positive for cough.   Cardiovascular: Positive for chest pain.  All other systems reviewed and are negative.     Allergies  Review of patient's allergies indicates no known allergies.  Home Medications   Prior to Admission medications   Medication Sig Start Date End Date Taking? Authorizing Provider  guaiFENesin (MUCINEX) 600 MG 12 hr tablet Take 600 mg by mouth 2 (two) times daily as needed for cough.   Yes Historical Provider, MD  ibuprofen  (ADVIL,MOTRIN) 200 MG tablet Take 400 mg by mouth every 6 (six) hours as needed for headache or moderate pain.   Yes Historical Provider, MD  megestrol (MEGACE) 40 MG tablet Take 2 tablets (80 mg total) by mouth 2 (two) times daily. 12/16/14  Yes Adam Phenix, MD  Pseudoeph-Doxylamine-DM-APAP (NIGHT TIME PO) Take 30 mLs by mouth daily as needed. For cold and flu symptoms per patient   Yes Historical Provider, MD  ondansetron (ZOFRAN) 4 MG tablet Take 1 tablet (4 mg total) by mouth every 6 (six) hours. Patient not taking: Reported on 01/20/2015 10/29/14   Jerelyn Scott, MD  pantoprazole (PROTONIX) 20 MG tablet Take 1 tablet (20 mg total) by mouth daily. Patient not taking: Reported on 02/03/2015 10/29/14   Jerelyn Scott, MD   BP 122/88 mmHg  Pulse 94  Temp(Src) 98.3 F (36.8 C) (Oral)  Resp 17  SpO2 99%  LMP 01/09/2015 (Approximate) Physical Exam  Constitutional: She is oriented to person, place, and time. She appears well-developed and well-nourished.  Non-toxic appearance. No distress.  HENT:  Head: Normocephalic and atraumatic.  Eyes: Conjunctivae, EOM and lids are normal. Pupils are equal, round, and reactive to light.  Neck: Normal range of motion. Neck supple. No tracheal deviation present. No thyroid mass present.  Cardiovascular: Normal rate, regular rhythm and normal heart sounds.  Exam reveals no gallop.  No murmur heard. Pulmonary/Chest: Effort normal. No stridor. No respiratory distress. She has decreased breath sounds. She has wheezes. She has no rhonchi. She has no rales.  Abdominal: Soft. Normal appearance and bowel sounds are normal. She exhibits no distension. There is no tenderness. There is no rebound and no CVA tenderness.  Musculoskeletal: Normal range of motion. She exhibits no edema or tenderness.  Neurological: She is alert and oriented to person, place, and time. She has normal strength. No cranial nerve deficit or sensory deficit. GCS eye subscore is 4. GCS verbal  subscore is 5. GCS motor subscore is 6.  Skin: Skin is warm and dry. No abrasion and no rash noted.  Psychiatric: She has a normal mood and affect. Her speech is normal and behavior is normal.  Nursing note and vitals reviewed.   ED Course  Procedures (including critical care time) Labs Review Labs Reviewed  BASIC METABOLIC PANEL - Abnormal; Notable for the following:    Glucose, Bld 110 (*)    All other components within normal limits  CBC - Abnormal; Notable for the following:    WBC 11.4 (*)    Hemoglobin 9.4 (*)    HCT 32.4 (*)    MCV 67.6 (*)    MCH 19.6 (*)    MCHC 29.0 (*)    RDW 19.0 (*)    All other components within normal limits  D-DIMER, QUANTITATIVE (NOT AT [facility])  Rosezena SensorI-STAT TROPOININ, ED    Imaging Review Dg Chest 2 View  02/03/2015  CLINICAL DATA:  Nonproductive cough EXAM: CHEST  2 VIEW COMPARISON:  10/06/2013 FINDINGS: Cardiomediastinal silhouette is stable. No acute infiltrate or pleural effusion. No pulmonary edema. Mild degenerative changes thoracic spine. IMPRESSION: No active cardiopulmonary disease. Electronically Signed   By: Natasha MeadLiviu  Pop M.D.   On: 02/03/2015 16:52   I have personally reviewed and evaluated these images and lab results as part of my medical decision-making.   EKG Interpretation   Date/Time:  Thursday February 03 2015 16:21:08 EST Ventricular Rate:  87 PR Interval:  150 QRS Duration: 86 QT Interval:  364 QTC Calculation: 438 R Axis:   -2 Text Interpretation:  Normal sinus rhythm Normal ECG No significant change  since last tracing Confirmed by Yoshua Geisinger  MD, Kimberly Nieland ([id number]) on 02/03/2015  6:58:20 PM      MDM   Final diagnoses:  None    Patient given albuterol here with some improvement. She remained tachypnea can have a d-dimer that was elevated and subsequently had a CT of her chest which was negative for PE. Was given steroids as well 2. Remains to Daycare and was restarted on another albuterol continuous treatment. Will be  admitted to medicine    Lorre NickAnthony Eliyah Mcshea, MD 02/04/15 [phone]

## 2015-02-04 DIAGNOSIS — R000 Tachycardia, unspecified: Secondary | ICD-10-CM

## 2015-02-04 DIAGNOSIS — J209 Acute bronchitis, unspecified: Secondary | ICD-10-CM | POA: Diagnosis present

## 2015-02-04 MED ORDER — HYDROCOD POLST-CPM POLST ER 10-8 MG/5ML PO SUER
5.0000 mL | Freq: Two times a day (BID) | ORAL | Status: DC | PRN
  Administered 2015-02-04: 5 mL via ORAL
  Filled 2015-02-04: qty 5

## 2015-02-04 MED ORDER — IBUPROFEN 400 MG PO TABS
400.0000 mg | ORAL_TABLET | Freq: Four times a day (QID) | ORAL | Status: DC | PRN
  Administered 2015-02-04: 400 mg via ORAL
  Filled 2015-02-04: qty 2

## 2015-02-04 MED ORDER — HYDROCOD POLST-CPM POLST ER 10-8 MG/5ML PO SUER: 5.0000 mL | Freq: Two times a day (BID) | ORAL | Status: DC | PRN

## 2015-02-04 MED ORDER — MEGESTROL ACETATE 40 MG PO TABS
80.0000 mg | ORAL_TABLET | Freq: Two times a day (BID) | ORAL | Status: DC
  Filled 2015-02-04 (×2): qty 2

## 2015-02-04 MED ORDER — INFLUENZA VAC SPLIT QUAD 0.5 ML IM SUSY: 0.5000 mL | PREFILLED_SYRINGE | INTRAMUSCULAR | Status: DC

## 2015-02-04 MED ORDER — PREDNISONE 50 MG PO TABS: ORAL_TABLET | ORAL | Status: AC

## 2015-02-04 MED ORDER — HEPARIN SODIUM (PORCINE) 5000 UNIT/ML IJ SOLN: 5000.0000 [IU] | Freq: Three times a day (TID) | INTRAMUSCULAR | Status: DC

## 2015-02-04 MED ORDER — ALBUTEROL SULFATE HFA 108 (90 BASE) MCG/ACT IN AERS: 2.0000 | INHALATION_SPRAY | Freq: Two times a day (BID) | RESPIRATORY_TRACT | Status: DC | PRN

## 2015-02-04 MED ORDER — AZITHROMYCIN 250 MG PO TABS: ORAL_TABLET | ORAL | Status: AC

## 2015-02-04 MED ORDER — ALBUTEROL (5 MG/ML) CONTINUOUS INHALATION SOLN
10.0000 mg/h | INHALATION_SOLUTION | Freq: Once | RESPIRATORY_TRACT | Status: AC
  Administered 2015-02-04: 10 mg/h via RESPIRATORY_TRACT
  Filled 2015-02-04: qty 20

## 2015-02-04 MED ORDER — CETYLPYRIDINIUM CHLORIDE 0.05 % MT LIQD: 7.0000 mL | Freq: Two times a day (BID) | OROMUCOSAL | Status: DC

## 2015-02-04 MED ORDER — PNEUMOCOCCAL VAC POLYVALENT 25 MCG/0.5ML IJ INJ: 0.5000 mL | INJECTION | INTRAMUSCULAR | Status: DC

## 2015-02-04 MED ORDER — HYDROCOD POLST-CPM POLST ER 10-8 MG/5ML PO SUER
5.0000 mL | Freq: Once | ORAL | Status: AC
  Administered 2015-02-04: 5 mL via ORAL
  Filled 2015-02-04: qty 5

## 2015-02-04 MED ORDER — GUAIFENESIN ER 600 MG PO TB12
600.0000 mg | ORAL_TABLET | Freq: Two times a day (BID) | ORAL | Status: DC | PRN
  Administered 2015-02-04: 600 mg via ORAL
  Filled 2015-02-04: qty 1

## 2015-02-04 MED ORDER — PREDNISONE 50 MG PO TABS
50.0000 mg | ORAL_TABLET | Freq: Every day | ORAL | Status: DC
  Administered 2015-02-04: 50 mg via ORAL
  Filled 2015-02-04: qty 1

## 2015-02-04 NOTE — H&P (Signed)
[facility] History and Physical  Katelyn Broadnax ZOX:[id number] DOB: 10-12-1965 DOA: 02/03/2015  Referring physician: EDP PCP: Scheryl Darter, MD   Chief Complaint: Cough   HPI: Rita Bass is a 49 y.o. female who presents to the ED with cough and SOB ongoing for past 1 week.  No fevers nor chills.  Cough is non-productive.  Associated wheezing, OTC meds not helping.  No H/O COPD, CHF, etc.  Review of Systems: Systems reviewed.  As above, otherwise negative  Past Medical History  Diagnosis Date  . Obesity   . Renal disorder    Past Surgical History  Procedure Laterality Date  . Oophorectomy    . Cesarean section    . Cholecystectomy    . Hernia repair    . Tubal ligation     Social History:  reports that she has never smoked. She has never used smokeless tobacco. She reports that she does not drink alcohol or use illicit drugs.  No Known Allergies  Family History  Problem Relation Age of Onset  . Breast cancer Mother      Prior to Admission medications   Medication Sig Start Date End Date Taking? Authorizing Provider  guaiFENesin (MUCINEX) 600 MG 12 hr tablet Take 600 mg by mouth 2 (two) times daily as needed for cough.   Yes Historical Provider, MD  ibuprofen (ADVIL,MOTRIN) 200 MG tablet Take 400 mg by mouth every 6 (six) hours as needed for headache or moderate pain.   Yes Historical Provider, MD  megestrol (MEGACE) 40 MG tablet Take 2 tablets (80 mg total) by mouth 2 (two) times daily. 12/16/14  Yes Adam Phenix, MD  Pseudoeph-Doxylamine-DM-APAP (NIGHT TIME PO) Take 30 mLs by mouth daily as needed. For cold and flu symptoms per patient   Yes Historical Provider, MD   Physical Exam: Filed Vitals:   02/04/15 0043  BP: 162/76  Pulse: 102  Temp:   Resp: 23    BP 162/76 mmHg  Pulse 102  Temp(Src) 98.3 F (36.8 C) (Oral)  Resp 23  SpO2 98%  LMP 01/09/2015 (Approximate)  General Appearance:    Alert, oriented, no distress, appears stated age  Head:     Normocephalic, atraumatic  Eyes:    PERRL, EOMI, sclera non-icteric        Nose:   Nares without drainage or epistaxis. Mucosa, turbinates normal  Throat:   Moist mucous membranes. Oropharynx without erythema or exudate.  Neck:   Supple. No carotid bruits.  No thyromegaly.  No lymphadenopathy.   Back:     No CVA tenderness, no spinal tenderness  Lungs:     Clear to auscultation bilaterally, without wheezes, rhonchi or rales  Chest wall:    No tenderness to palpitation  Heart:    Regular rate and rhythm without murmurs, gallops, rubs  Abdomen:     Soft, non-tender, nondistended, normal bowel sounds, no organomegaly  Genitalia:    deferred  Rectal:    deferred  Extremities:   No clubbing, cyanosis or edema.  Pulses:   2+ and symmetric all extremities  Skin:   Skin color, texture, turgor normal, no rashes or lesions  Lymph nodes:   Cervical, supraclavicular, and axillary nodes normal  Neurologic:   CNII-XII intact. Normal strength, sensation and reflexes      throughout    Labs on Admission:  Basic Metabolic Panel:  Recent Labs Lab 02/03/15 1626  NA 137  K 4.4  CL 103  CO2 25  GLUCOSE 110*  BUN 8  CREATININE 0.60  CALCIUM 8.9   Liver Function Tests: No results for input(s): AST, ALT, ALKPHOS, BILITOT, PROT, ALBUMIN in the last 168 hours. No results for input(s): LIPASE, AMYLASE in the last 168 hours. No results for input(s): AMMONIA in the last 168 hours. CBC:  Recent Labs Lab 02/03/15 1626  WBC 11.4*  HGB 9.4*  HCT 32.4*  MCV 67.6*  PLT 352   Cardiac Enzymes: No results for input(s): CKTOTAL, CKMB, CKMBINDEX, TROPONINI in the last 168 hours.  BNP (last 3 results) No results for input(s): PROBNP in the last 8760 hours. CBG: No results for input(s): GLUCAP in the last 168 hours.  Radiological Exams on Admission: Dg Chest 2 View  02/03/2015  CLINICAL DATA:  Nonproductive cough EXAM: CHEST  2 VIEW COMPARISON:  10/06/2013 FINDINGS: Cardiomediastinal silhouette  is stable. No acute infiltrate or pleural effusion. No pulmonary edema. Mild degenerative changes thoracic spine. IMPRESSION: No active cardiopulmonary disease. Electronically Signed   By: Natasha MeadLiviu  Pop M.D.   On: 02/03/2015 16:52   Ct Angio Chest Pe W/cm &/or Wo Cm  02/04/2015  CLINICAL DATA:  Acute onset of mid chest pain, cough and shortness of breath. Initial encounter. EXAM: CT ANGIOGRAPHY CHEST WITH CONTRAST TECHNIQUE: Multidetector CT imaging of the chest was performed using the standard protocol during bolus administration of intravenous contrast. Multiplanar CT image reconstructions and MIPs were obtained to evaluate the vascular anatomy. CONTRAST:  80mL OMNIPAQUE IOHEXOL 350 MG/ML SOLN COMPARISON:  Chest radiograph performed earlier today at 4:44 p.m., and CTA of the chest performed 05/08/2013 FINDINGS: There is no evidence of significant pulmonary embolus. Evaluation for pulmonary embolus is mildly suboptimal due to motion artifact. The lungs are grossly clear bilaterally. There is no evidence of significant focal consolidation, pleural effusion or pneumothorax. No masses are identified; no abnormal focal contrast enhancement is seen. The mediastinum is unremarkable appearance. No mediastinal lymphadenopathy is seen. No pericardial effusion is identified. The great vessels are grossly unremarkable. No axillary lymphadenopathy is seen. The visualized portions of the thyroid gland are unremarkable in appearance. The visualized portions of the liver and spleen are unremarkable. No acute osseous abnormalities are seen. Review of the MIP images confirms the above findings. IMPRESSION: 1. No evidence of significant pulmonary embolus. 2. Lungs clear bilaterally. Electronically Signed   By: Roanna RaiderJeffery  Chang M.D.   On: 02/04/2015 00:10    EKG: Independently reviewed.  Assessment/Plan Principal Problem:   Acute bronchitis Active Problems:   Bronchitis   1. Acute bronchitis - 1. Steroids 2. Adult wheeze  protocol   Code Status: Full  Family Communication: Family at bedside Disposition Plan: admit to obs   Time spent: 30 min  GARDNER, JARED M. [facility] Pager [phone]  If 7AM-7PM, please contact the day team taking care of the patient [email or web address] Password [email or web address] 02/04/2015, 12:52 AM

## 2015-02-04 NOTE — Care Management Note (Signed)
Case Management Note  Patient Details  Name: Rita HockingDebbie Bass MRN: [id number] Date of Birth: May 22, 1965  Subjective/Objective:   RN dc patient before NCM could speak to patient, NCM tried to reach patient , did not get an answer,  Patient has follow up appt at [facility] that she needs information on.                   Action/Plan:   Expected Discharge Date:                  Expected Discharge Plan:  Home/Self Care  In-House Referral:     Discharge planning Services  CM Consult, Indigent [facility] Clinic  Post Acute Care Choice:    Choice offered to:     DME Arranged:    DME Agency:     HH Arranged:    HH Agency:     Status of Service:  Completed, signed off  Medicare Important Message Given:    Date Medicare IM Given:    Medicare IM give by:    Date Additional Medicare IM Given:    Additional Medicare Important Message give by:     If discussed at Long Length of Stay Meetings, dates discussed:    Additional Comments:  Leone Havenaylor, Bathsheba Durrett Clinton, RN 02/04/2015, 3:07 PM

## 2015-02-04 NOTE — Progress Notes (Signed)
Nsg Discharge Note  Admit Date:  02/03/2015 Discharge date: 02/04/2015   Rita HockingDebbie Bass to be D/C'd Home per MD order.  AVS completed.  Copy for chart, and copy for patient signed, and dated. Patient/caregiver able to verbalize understanding.  Discharge Medication:   Medication List    STOP taking these medications        NIGHT TIME PO      TAKE these medications        azithromycin 250 MG tablet  Commonly known as:  ZITHROMAX Z-PAK  Take 500 mg today ( 2 tablets) , then 250 mg daily for next 4 days     chlorpheniramine-HYDROcodone 10-8 MG/5ML Suer  Commonly known as:  TUSSIONEX  Take 5 mLs by mouth every 12 (twelve) hours as needed for cough.     ibuprofen 200 MG tablet  Commonly known as:  ADVIL,MOTRIN  Take 400 mg by mouth every 6 (six) hours as needed for headache or moderate pain.     megestrol 40 MG tablet  Commonly known as:  MEGACE  Take 2 tablets (80 mg total) by mouth 2 (two) times daily.     MUCINEX 600 MG 12 hr tablet  Generic drug:  guaiFENesin  Take 600 mg by mouth 2 (two) times daily as needed for cough.     predniSONE 50 MG tablet  Commonly known as:  DELTASONE  Daily for 5 days  Start taking on:  02/05/2015        Discharge Assessment: Filed Vitals:   02/04/15 0534  BP: 121/58  Pulse: 111  Temp: 97.6 F (36.4 C)  Resp: 16   Skin clean, dry and intact without evidence of skin break down, no evidence of skin tears noted. IV catheter discontinued intact. Site without signs and symptoms of complications - no redness or edema noted at insertion site, patient denies c/o pain - only slight tenderness at site.  Dressing with slight pressure applied.  D/c Instructions-Education: Discharge instructions given to patient/family with verbalized understanding. D/c education completed with patient/family including follow up instructions, medication list, d/c activities limitations if indicated, with other d/c instructions as indicated by MD - patient  able to verbalize understanding, all questions fully answered. Patient instructed to return to ED, call 911, or call MD for any changes in condition.  Patient escorted via WC, and D/C home via private auto.  Camillo FlamingVicki L Zaide Mcclenahan, RN 02/04/2015 10:49 AM

## 2015-02-04 NOTE — Progress Notes (Signed)
Received report from PlankintonMelanie, RN from ED.

## 2015-02-04 NOTE — Discharge Summary (Signed)
Physician Discharge Summary  Rita Bass ZOX:[id number] DOB: Apr 12, 1965 DOA: 02/03/2015  PCP: Scheryl Darter, MD  Admit date: 02/03/2015 Discharge date: 02/04/2015  Time spent: 25 minutes  Recommendations for Outpatient Follow-up:  Discharge home and outpatient follow-up. Patient to be discharged on a short course of prednisone and Z-Pak  Discharge Diagnoses:  Principal Problem:   Acute bronchitis  Active Problems:   Morbid obesity (HCC)   Sinus tachycardia (HCC)    Discharge Condition: Fair  Diet recommendation: Regular  Filed Weights   02/04/15 0144  Weight: 161.1 kg (355 lb 2.6 oz)    History of present illness:  49 year old morbidly obese female with history of menorrhagia presented to the ED with nonproductive cough with shortness of breath for the past 1 week. Patient denies any fevers or chills. She reports associated wheezing not resolved with outpatient medications. Denies any sick contacts or recent travel. In the ED she was given several rounds of the bladder without significant improvement. She was tachycardic and had mildly elevated d-dimer for which CT abdomen with the chest was done which was negative for PE. Patient admitted on observation for further management.  Hospital Course:  Acute bronchitis Admitted on observation and placed on oral prednisone, antitussives and nebulizers. She showed good clinical improvement and maintaining O2 sat on room air. Will discharge her on Z-Pak, oral prednisone 2 mg daily for 5 days and will also prescribe albuterol inhaler as needed (patient reports getting wheezy and short of breath with exertion).  Morbid obesity Counseling on weight loss and exercise.  Menorrhagia On Megace and follows with GYN as outpatient  Procedures: CT angiogram of the chest   Consultations:  None  Discharge Exam: Filed Vitals:   02/04/15 0534  BP: 121/58  Pulse: 111  Temp: 97.6 F (36.4 C)  Resp: 16    General: Middle aged  morbidly obese female not in distress HEENT: No pallor, muscular mucosa Chest: Clear to auscultation bilaterally   CVS: Normal S1 and S2, no murmurs rub or gallop GI: Soft, nondistended, nontender Musculoskeletal: Warm, no edema   Discharge Instructions    Current Discharge Medication List    START taking these medications   Details  albuterol (PROVENTIL HFA;VENTOLIN HFA) 108 (90 BASE) MCG/ACT inhaler Inhale 2 puffs into the lungs every 12 (twelve) hours as needed for wheezing or shortness of breath. Qty: 1 Inhaler, Refills: 2    azithromycin (ZITHROMAX Z-PAK) 250 MG tablet Take 500 mg today ( 2 tablets) , then 250 mg daily for next 4 days Qty: 6 each, Refills: 0    chlorpheniramine-HYDROcodone (TUSSIONEX) 10-8 MG/5ML SUER Take 5 mLs by mouth every 12 (twelve) hours as needed for cough. Qty: 140 mL, Refills: 0    predniSONE (DELTASONE) 50 MG tablet Daily for 5 days Qty: 5 tablet, Refills: 0      CONTINUE these medications which have NOT CHANGED   Details  guaiFENesin (MUCINEX) 600 MG 12 hr tablet Take 600 mg by mouth 2 (two) times daily as needed for cough.    ibuprofen (ADVIL,MOTRIN) 200 MG tablet Take 400 mg by mouth every 6 (six) hours as needed for headache or moderate pain.    megestrol (MEGACE) 40 MG tablet Take 2 tablets (80 mg total) by mouth 2 (two) times daily. Qty: 60 tablet, Refills: 2   Associated Diagnoses: DUB (dysfunctional uterine bleeding)      STOP taking these medications     Pseudoeph-Doxylamine-DM-APAP (NIGHT TIME PO)        No Known Allergies  Follow-up Information    Follow up with ARNOLD,JAMES, MD In 2 weeks.   Specialty:  Obstetrics and Gynecology   Contact information:   [address] [place] [address] [phone]        The results of significant diagnostics from this hospitalization (including imaging, microbiology, ancillary and laboratory) are listed below for reference.    Significant Diagnostic Studies: Dg Chest  2 View  02/03/2015  CLINICAL DATA:  Nonproductive cough EXAM: CHEST  2 VIEW COMPARISON:  10/06/2013 FINDINGS: Cardiomediastinal silhouette is stable. No acute infiltrate or pleural effusion. No pulmonary edema. Mild degenerative changes thoracic spine. IMPRESSION: No active cardiopulmonary disease. Electronically Signed   By: Natasha Mead M.D.   On: 02/03/2015 16:52   Ct Angio Chest Pe W/cm &/or Wo Cm  02/04/2015  CLINICAL DATA:  Acute onset of mid chest pain, cough and shortness of breath. Initial encounter. EXAM: CT ANGIOGRAPHY CHEST WITH CONTRAST TECHNIQUE: Multidetector CT imaging of the chest was performed using the standard protocol during bolus administration of intravenous contrast. Multiplanar CT image reconstructions and MIPs were obtained to evaluate the vascular anatomy. CONTRAST:  80mL OMNIPAQUE IOHEXOL 350 MG/ML SOLN COMPARISON:  Chest radiograph performed earlier today at 4:44 p.m., and CTA of the chest performed 05/08/2013 FINDINGS: There is no evidence of significant pulmonary embolus. Evaluation for pulmonary embolus is mildly suboptimal due to motion artifact. The lungs are grossly clear bilaterally. There is no evidence of significant focal consolidation, pleural effusion or pneumothorax. No masses are identified; no abnormal focal contrast enhancement is seen. The mediastinum is unremarkable appearance. No mediastinal lymphadenopathy is seen. No pericardial effusion is identified. The great vessels are grossly unremarkable. No axillary lymphadenopathy is seen. The visualized portions of the thyroid gland are unremarkable in appearance. The visualized portions of the liver and spleen are unremarkable. No acute osseous abnormalities are seen. Review of the MIP images confirms the above findings. IMPRESSION: 1. No evidence of significant pulmonary embolus. 2. Lungs clear bilaterally. Electronically Signed   By: Roanna Raider M.D.   On: 02/04/2015 00:10   Mm Diag Breast Tomo Uni  Left  01/27/2015  CLINICAL DATA:  Possible mass left breast identified on recent 2D screening mammogram. EXAM: DIGITAL DIAGNOSTIC LEFT MAMMOGRAM WITH 3D TOMOSYNTHESIS AND CAD COMPARISON:  12/28/2014 ACR Breast Density Category a: The breast tissue is almost entirely fatty. FINDINGS: 3D tomosynthesis views of the left breast in the CC and MLO projections are performed. There is no evidence of mass, asymmetry, or distortion. Normal fatty breast parenchyma is seen in the region of concern on recent 2D screening mammogram. Mammographic images were processed with CAD. IMPRESSION: No evidence of malignancy in the left breast. RECOMMENDATION: Screening mammogram in one year.(Code:SM-B-01Y) I have discussed the findings and recommendations with the patient. Results were also provided in writing at the conclusion of the visit. If applicable, a reminder letter will be sent to the patient regarding the next appointment. BI-RADS CATEGORY  1: Negative. Electronically Signed   By: Britta Mccreedy M.D.   On: 01/27/2015 13:02    Microbiology: No results found for this or any previous visit (from the past 240 hour(s)).   Labs: Basic Metabolic Panel:  Recent Labs Lab 02/03/15 1626  NA 137  K 4.4  CL 103  CO2 25  GLUCOSE 110*  BUN 8  CREATININE 0.60  CALCIUM 8.9   Liver Function Tests: No results for input(s): AST, ALT, ALKPHOS, BILITOT, PROT, ALBUMIN in the last 168 hours. No results for input(s):  LIPASE, AMYLASE in the last 168 hours. No results for input(s): AMMONIA in the last 168 hours. CBC:  Recent Labs Lab 02/03/15 1626  WBC 11.4*  HGB 9.4*  HCT 32.4*  MCV 67.6*  PLT 352   Cardiac Enzymes: No results for input(s): CKTOTAL, CKMB, CKMBINDEX, TROPONINI in the last 168 hours. BNP: BNP (last 3 results) No results for input(s): BNP in the last 8760 hours.  ProBNP (last 3 results) No results for input(s): PROBNP in the last 8760 hours.  CBG: No results for input(s): GLUCAP in the last 168  hours.     SignedEddie North:  [place]  [facility] 02/04/2015, 11:06 AM

## 2015-02-04 NOTE — Progress Notes (Signed)
NURSING PROGRESS NOTE  Rita Bass  MRN: [id number]  Admission Data: 02/04/2015 2:05 AM  Attending Provider: Hillary BowJared M Gardner, DO  PCP: Scheryl DarterARNOLD,JAMES, MD  Code status: FULL  Allergies: No Known Allergies   Past Medical History:  has a past medical history of Obesity and Renal disorder.   Past Surgical History:  has past surgical history that includes Oophorectomy; Cesarean section; Cholecystectomy; Hernia repair; and Tubal ligation.   Rita Bass is a 49 y.o.  female patient, arrived to floor in room [phone] via stretcher, transferred from ED. Patient alert and oriented X 4. Complains of dry cough and chest pain 3/10 related to cough. Patient currently on continuous pulse oximetry monitoring.  Vital signs: Oral temperature 98.5 F (36.9 C), Blood pressure 135/95, Pulse 133, RR 15, SpO2 100 % on Venturi mask with 9.5L./min. Height 5'4" (162.6 cm), weight 355 lbs (161.1 kg).   Cardiac monitoring: Telemetry box 5W #  08 in place.  IV access: Left antecubital; condition patent and no redness.  Skin: intact, no pressure ulcer noted in sacral area.   Patient's ID armband verified with patient/ family, and in place. Information packet given to patient/ family. Fall risk assessed, SR up X2, patient/ family able to verbalize understanding of risks associated with falls and to call nurse or staff to assist before getting out of bed. Patient/ family oriented to room and equipment. Call bell within reach.

## 2015-02-04 NOTE — Progress Notes (Signed)
Donnamarie PoagK. Kirby NP paged and notified about patient's HR running in the 130's - 140's shown on Telemetry monitoring.

## 2015-02-24 ENCOUNTER — Ambulatory Visit: Payer: No Typology Code available for payment source | Admitting: Family Medicine

## 2015-03-22 ENCOUNTER — Ambulatory Visit: Payer: No Typology Code available for payment source | Admitting: Family Medicine

## 2015-04-08 ENCOUNTER — Encounter (HOSPITAL_COMMUNITY): Payer: Self-pay | Admitting: Neurology

## 2015-04-08 ENCOUNTER — Emergency Department (HOSPITAL_COMMUNITY)
Admission: EM | Admit: 2015-04-08 | Discharge: 2015-04-08 | Disposition: A | Discharge status: Home / Self Care | Payer: Medicaid Other | Attending: Emergency Medicine | Admitting: Emergency Medicine

## 2015-04-08 DIAGNOSIS — Z87448 Personal history of other diseases of urinary system: Secondary | ICD-10-CM | POA: Insufficient documentation

## 2015-04-08 DIAGNOSIS — L0291 Cutaneous abscess, unspecified: Secondary | ICD-10-CM

## 2015-04-08 DIAGNOSIS — L02211 Cutaneous abscess of abdominal wall: Secondary | ICD-10-CM | POA: Diagnosis not present

## 2015-04-08 DIAGNOSIS — Z79899 Other long term (current) drug therapy: Secondary | ICD-10-CM | POA: Diagnosis not present

## 2015-04-08 DIAGNOSIS — E669 Obesity, unspecified: Secondary | ICD-10-CM | POA: Insufficient documentation

## 2015-04-08 MED ORDER — LIDOCAINE-EPINEPHRINE (PF) 2 %-1:200000 IJ SOLN
10.0000 mL | Freq: Once | INTRAMUSCULAR | Status: AC
  Administered 2015-04-08: 10 mL
  Filled 2015-04-08: qty 10

## 2015-04-08 MED ORDER — SULFAMETHOXAZOLE-TRIMETHOPRIM 800-160 MG PO TABS: 1.0000 | ORAL_TABLET | Freq: Two times a day (BID) | ORAL | Status: AC

## 2015-04-08 MED ORDER — OXYCODONE-ACETAMINOPHEN 5-325 MG PO TABS
1.0000 | ORAL_TABLET | Freq: Once | ORAL | Status: AC
  Administered 2015-04-08: 1 via ORAL
  Filled 2015-04-08: qty 1

## 2015-04-08 NOTE — ED Provider Notes (Signed)
CSN: [id number]     Arrival date & time 04/08/15  1614 History  By signing my name below, I, Rita Bass, attest that this documentation has been prepared under the direction and in the presence of [employer], PA-C. Electronically Signed: Placido Bass, ED Scribe. 04/08/2015. 5:58 PM.    Chief Complaint  Patient presents with  . Abscess   The history is provided by the patient. No language interpreter was used.    HPI Comments: Rita Bass is a 50 y.o. female with a hx of abscesses, obesity and renal disorder who presents to the Emergency Department complaining of an abscess to her LLQ onset 3 days ago. Pt notes that she attempted to drain the abscess with a needle at home yesterday which resulted in copious amounts of purulent drainage now noting continued swelling with associated, moderate, radiating pain in the affected region. She has an appointment with a new PCP in 4 days. Pt denies a hx of DM. She denies fevers, chills, n/v/d, or any other associated symptoms at this time.    Past Medical History  Diagnosis Date  . Obesity   . Renal disorder    Past Surgical History  Procedure Laterality Date  . Oophorectomy    . Cesarean section    . Cholecystectomy    . Hernia repair    . Tubal ligation     Family History  Problem Relation Age of Onset  . Breast cancer Mother    Social History  Substance Use Topics  . Smoking status: Never Smoker   . Smokeless tobacco: Never Used  . Alcohol Use: No   OB History    Gravida Para Term Preterm AB TAB SAB Ectopic Multiple Living   0 0 0 0 2 7     Review of Systems  All other systems reviewed and are negative.   Allergies  Review of patient's allergies indicates no known allergies.  Home Medications   Prior to Admission medications   Medication Sig Start Date End Date Taking? Authorizing Provider  albuterol (PROVENTIL HFA;VENTOLIN HFA) 108 (90 BASE) MCG/ACT inhaler Inhale 2 puffs into the lungs  every 12 (twelve) hours as needed for wheezing or shortness of breath. 02/04/15   Nishant Dhungel, MD  chlorpheniramine-HYDROcodone (TUSSIONEX) 10-8 MG/5ML SUER Take 5 mLs by mouth every 12 (twelve) hours as needed for cough. 02/04/15   Nishant Dhungel, MD  guaiFENesin (MUCINEX) 600 MG 12 hr tablet Take 600 mg by mouth 2 (two) times daily as needed for cough.    Historical Provider, MD  ibuprofen (ADVIL,MOTRIN) 200 MG tablet Take 400 mg by mouth every 6 (six) hours as needed for headache or moderate pain.    Historical Provider, MD  megestrol (MEGACE) 40 MG tablet Take 2 tablets (80 mg total) by mouth 2 (two) times daily. 12/16/14   Adam Phenix, MD   BP 151/84 mmHg  Pulse 99  Temp(Src) 97.8 F (36.6 C) (Oral)  Resp 16  SpO2 94%  LMP 03/25/2015 Physical Exam  Constitutional: She is oriented to person, place, and time. She appears well-developed and well-nourished. No distress.  HENT:  Head: Normocephalic and atraumatic.  Eyes: Conjunctivae are normal. Right eye exhibits no discharge. Left eye exhibits no discharge. No scleral icterus.  Cardiovascular: Normal rate.   Pulmonary/Chest: Effort normal.  Abdominal: Soft. Bowel sounds are normal. She exhibits no distension and no mass. There is no tenderness. There is no rebound and no guarding.  Neurological: She is  alert and oriented to person, place, and time. Coordination normal.  Skin: Skin is warm and dry. No rash noted. She is not diaphoretic. No erythema. No pallor.  2 cm fluctuant abscess to left lower aspect of the abdominal panis along the skin fold. Mild surrounding erythema. No streaking. No drainage noted.  Psychiatric: She has a normal mood and affect. Her behavior is normal.  Nursing note and vitals reviewed.   ED Course  Procedures  DIAGNOSTIC STUDIES: Oxygen Saturation is 94% on RA, adequate by my interpretation.    COORDINATION OF CARE: 5:16 PM Pt presents today due to an abscess. Discussed treatment plan with pt at  bedside including an I&D and an rx for abx. Return precautions noted. Pt agreed to plan.  INCISION AND DRAINAGE PROCEDURE NOTE: Patient identification was confirmed and verbal consent was obtained. This procedure was performed by Dub Mikes, PA-C at 5:45 PM. Site: LLQ Sterile procedures observed Needle size: 25 gauge Anesthetic used (type and amt): 2% lidocaine w/epi; 5 mL Blade size: 11 blade Drainage: minimal Complexity: Complex Site anesthetized, incision made over site, wound drained and explored loculations, rinsed with copious amounts of normal saline, wound covered with dry, sterile dressing.  Pt tolerated procedure well without complications.  Instructions for care discussed verbally and pt provided with additional written instructions for homecare and f/u.   Labs Review Labs Reviewed - No data to display  Imaging Review No results found.   EKG Interpretation None      MDM   Final diagnoses:  Abscess    Patient with skin abscess amenable to incision and drainage.  Abscess was not large enough to warrant packing or drain,  wound recheck in 2 days. Encouraged home warm soaks and flushing.  Mild signs of cellulitis is surrounding skin.  Will d/c to home with antibiotics.   I personally performed the services described in this documentation, which was scribed in my presence. The recorded information has been reviewed and is accurate.     Lester Kinsman [place], PA-C 04/09/15 2057  Lyndal Pulley, MD 04/13/15 [phone]

## 2015-04-08 NOTE — ED Notes (Signed)
Pt reports abscess to LLQ for 3 days, tried to open it with a needle since then has been having lower abd pain. No fever, denies n/v/d.

## 2015-04-08 NOTE — Discharge Instructions (Signed)
Abscess °An abscess is an infected area that contains a collection of pus and debris. It can occur in almost any part of the body. An abscess is also known as a furuncle or boil. °CAUSES  °An abscess occurs when tissue gets infected. This can occur from blockage of oil or sweat glands, infection of hair follicles, or a minor injury to the skin. As the body tries to fight the infection, pus collects in the area and creates pressure under the skin. This pressure causes pain. People with weakened immune systems have difficulty fighting infections and get certain abscesses more often.  °SYMPTOMS °Usually an abscess develops on the skin and becomes a painful mass that is red, warm, and tender. If the abscess forms under the skin, you may feel a moveable soft area under the skin. Some abscesses break open (rupture) on their own, but most will continue to get worse without care. The infection can spread deeper into the body and eventually into the bloodstream, causing you to feel ill.  °DIAGNOSIS  °Your caregiver will take your medical history and perform a physical exam. A sample of fluid may also be taken from the abscess to determine what is causing your infection. °TREATMENT  °Your caregiver may prescribe antibiotic medicines to fight the infection. However, taking antibiotics alone usually does not cure an abscess. Your caregiver may need to make a small cut (incision) in the abscess to drain the pus. In some cases, gauze is packed into the abscess to reduce pain and to continue draining the area. °HOME CARE INSTRUCTIONS  °· Only take over-the-counter or prescription medicines for pain, discomfort, or fever as directed by your caregiver. °· If you were prescribed antibiotics, take them as directed. Finish them even if you start to feel better. °· If gauze is used, follow your caregiver's directions for changing the gauze. °· To avoid spreading the infection: °· Keep your draining abscess covered with a  bandage. °· Wash your hands well. °· Do not share personal care items, towels, or whirlpools with others. °· Avoid skin contact with others. °· Keep your skin and clothes clean around the abscess. °· Keep all follow-up appointments as directed by your caregiver. °SEEK MEDICAL CARE IF:  °· You have increased pain, swelling, redness, fluid drainage, or bleeding. °· You have muscle aches, chills, or a general ill feeling. °· You have a fever. °MAKE SURE YOU:  °· Understand these instructions. °· Will watch your condition. °· Will get help right away if you are not doing well or get worse. °  °This information is not intended to replace advice given to you by your health care provider. Make sure you discuss any questions you have with your health care provider. °  °Document Released: 12/13/2004 Document Revised: 09/04/2011 Document Reviewed: 05/18/2011 °Elsevier Interactive Patient Education ©2016 [employer] ° °Incision and Drainage °Incision and drainage is a procedure in which a sac-like structure (cystic structure) is opened and drained. The area to be drained usually contains material such as pus, fluid, or blood.  °LET YOUR CAREGIVER KNOW ABOUT:  °· Allergies to medicine. °· Medicines taken, including vitamins, herbs, eyedrops, over-the-counter medicines, and creams. °· Use of steroids (by mouth or creams). °· Previous problems with anesthetics or numbing medicines. °· History of bleeding problems or blood clots. °· Previous surgery. °· Other health problems, including diabetes and kidney problems. °· Possibility of pregnancy, if this applies. °RISKS AND COMPLICATIONS °· Pain. °· Bleeding. °· Scarring. °· Infection. °BEFORE THE PROCEDURE  °  You may need to have an ultrasound or other imaging tests to see how large or deep your cystic structure is. Blood tests may also be used to determine if you have an infection or how severe the infection is. You may need to have a tetanus shot. PROCEDURE  The affected area  is cleaned with a cleaning fluid. The cyst area will then be numbed with a medicine (local anesthetic). A small incision will be made in the cystic structure. A syringe or catheter may be used to drain the contents of the cystic structure, or the contents may be squeezed out. The area will then be flushed with a cleansing solution. After cleansing the area, it is often gently packed with a gauze or another wound dressing. Once it is packed, it will be covered with gauze and tape or some other type of wound dressing. AFTER THE PROCEDURE   Often, you will be allowed to go home right after the procedure.  You may be given antibiotic medicine to prevent or heal an infection.  If the area was packed with gauze or some other wound dressing, you will likely need to come back in 1 to 2 days to get it removed.  The area should heal in about 14 days.   This information is not intended to replace advice given to you by your health care provider. Make sure you discuss any questions you have with your health care provider.  Keep appointment with PCP for next Tuesday. You will need a wound recheck. Take ibuprofen as needed for pain. Keep wound clean and dry. May wash with soap and water. Return to the emergency department a few experience severe increase in your pain, fevers, chills, redness or swelling around the wound.

## 2015-04-21 ENCOUNTER — Ambulatory Visit: Payer: No Typology Code available for payment source | Admitting: Family Medicine

## 2015-05-06 ENCOUNTER — Ambulatory Visit: Payer: No Typology Code available for payment source | Admitting: Family Medicine

## 2015-09-27 ENCOUNTER — Other Ambulatory Visit: Payer: Self-pay | Admitting: Orthopaedic Surgery

## 2015-10-05 ENCOUNTER — Other Ambulatory Visit (HOSPITAL_COMMUNITY): Payer: Self-pay | Admitting: *Deleted

## 2015-10-05 ENCOUNTER — Encounter (HOSPITAL_COMMUNITY): Payer: Self-pay

## 2015-10-05 ENCOUNTER — Encounter (HOSPITAL_COMMUNITY): Payer: Self-pay | Admitting: *Deleted

## 2015-10-05 ENCOUNTER — Encounter (HOSPITAL_COMMUNITY)
Admission: RE | Admit: 2015-10-05 | Discharge: 2015-10-05 | Disposition: A | Discharge status: Home / Self Care | Payer: Medicaid Other | Source: Ambulatory Visit | Attending: Orthopaedic Surgery | Admitting: Orthopaedic Surgery

## 2015-10-05 DIAGNOSIS — Z01812 Encounter for preprocedural laboratory examination: Secondary | ICD-10-CM | POA: Insufficient documentation

## 2015-10-05 HISTORY — DX: Unspecified osteoarthritis, unspecified site: M19.90

## 2015-10-05 HISTORY — DX: Personal history of other diseases of the respiratory system: Z87.09

## 2015-10-05 HISTORY — DX: Reserved for inherently not codable concepts without codable children: IMO0001

## 2015-10-05 HISTORY — DX: Calculus of kidney: N20.0

## 2015-10-05 LAB — CBC
HEMATOCRIT: 36.2 % (ref 36.0–46.0)
HEMOGLOBIN: 10.2 g/dL — AB (ref 12.0–15.0)
MCH: 18.9 pg — AB (ref 26.0–34.0)
MCHC: 28.2 g/dL — AB (ref 30.0–36.0)
MCV: 67 fL — ABNORMAL LOW (ref 78.0–100.0)
Platelets: 379 10*3/uL (ref 150–400)
RBC: 5.4 MIL/uL — ABNORMAL HIGH (ref 3.87–5.11)
RDW: 18.7 % — ABNORMAL HIGH (ref 11.5–15.5)
WBC: 10.3 10*3/uL (ref 4.0–10.5)

## 2015-10-05 LAB — HCG, SERUM, QUALITATIVE: Preg, Serum: NEGATIVE

## 2015-10-05 NOTE — Progress Notes (Signed)
Pt denies cardiac history, chest pain or sob today. Has a history of sob, usually with bronchitis.

## 2015-10-05 NOTE — Pre-Procedure Instructions (Addendum)
Rita HockingDebbie Bass  10/05/2015     Your procedure is scheduled on Wednesday, October 12, 2015 at 12:30 PM.   [facility] at 10:30 AM.   Call this number if you have problems the morning of surgery: [phone]   Any questions prior to day of surgery, please call [phone] between 8 & 4 PM.   Remember:  Do not eat food or drink liquids after midnight Tuesday, 10/11/15.  May use your Albuterol inhaler if needed. Bring inhaler with you day of surgery.  Stop NSAIDS (Ibuprofen, Naproxen, etc.) as of today. Do not use Aspirin products prior to surgery.    Do not wear jewelry, make-up or nail polish.  Do not wear lotions, powders, or perfumes.  You may wear deoderant.  Do not shave 48 hours prior to surgery.    Do not bring valuables to the hospital.  [facility] is not responsible for any belongings or valuables.  Contacts, dentures or bridgework may not be worn into surgery.  Leave your suitcase in the car.  After surgery it may be brought to your room.  For patients admitted to the hospital, discharge time will be determined by your treatment team.  Patients discharged the day of surgery will not be allowed to drive home.   Special instructions:  [place] - Preparing for Surgery  Before surgery, you can play an important role.  Because skin is not sterile, your skin needs to be as free of germs as possible.  You can reduce the number of germs on you skin by washing with CHG (chlorahexidine gluconate) soap before surgery.  CHG is an antiseptic cleaner which kills germs and bonds with the skin to continue killing germs even after washing.  Please DO NOT use if you have an allergy to CHG or antibacterial soaps.  If your skin becomes reddened/irritated stop using the CHG and inform your nurse when you arrive at Short Stay.  Do not shave (including legs and underarms) for at least 48 hours prior to the first CHG shower.  You may shave your  face.  Please follow these instructions carefully:   1.  Shower with CHG Soap the night before surgery and the                                morning of Surgery.  2.  If you choose to wash your hair, wash your hair first as usual with your       normal shampoo.  3.  After you shampoo, rinse your hair and body thoroughly to remove the                      Shampoo.  4.  Use CHG as you would any other liquid soap.  You can apply chg directly       to the skin and wash gently with scrungie or a clean washcloth.  5.  Apply the CHG Soap to your body ONLY FROM THE NECK DOWN.        Do not use on open wounds or open sores.  Avoid contact with your eyes, ears, mouth and genitals (private parts).  Wash genitals (private parts) with your normal soap.  6.  Wash thoroughly, paying special attention to the area where your surgery        will be performed.  7.  Thoroughly rinse your  body with warm water from the neck down.  8.  DO NOT shower/wash with your normal soap after using and rinsing off       the CHG Soap.  9.  Pat yourself dry with a clean towel.            10.  Wear clean pajamas.            11.  Place clean sheets on your bed the night of your first shower and do not        sleep with pets.  Day of Surgery  Do not apply any lotions the morning of surgery.  Please wear clean clothes to the hospital.   Please read over the following fact sheets that you were given. Pain Booklet, Coughing and Deep Breathing and Surgical Site Infection Prevention

## 2015-10-11 NOTE — Progress Notes (Signed)
Pt called today stating that she has a bad cough and sorethroat. I instructed her to call Dr. Warren Danes office and let them know. She states she left a message yesterday with the office and has not heard back from them. I told her to call again. She states she doesn't have a fever. I called and spoke with Sherrie at Dr. Warren Danes office and she had gotten the message.

## 2015-10-19 ENCOUNTER — Ambulatory Visit (HOSPITAL_COMMUNITY): Admission: RE | Admit: 2015-10-19 | Payer: Medicaid Other | Source: Ambulatory Visit | Admitting: Orthopaedic Surgery

## 2015-10-19 ENCOUNTER — Encounter (HOSPITAL_COMMUNITY): Admission: RE | Payer: Self-pay | Source: Ambulatory Visit

## 2015-10-19 SURGERY — ARTHROSCOPY, KNEE
Anesthesia: General | Laterality: Left

## 2016-03-21 ENCOUNTER — Other Ambulatory Visit: Payer: Self-pay | Admitting: Obstetrics & Gynecology

## 2016-03-21 DIAGNOSIS — Z1231 Encounter for screening mammogram for malignant neoplasm of breast: Secondary | ICD-10-CM

## 2016-04-03 ENCOUNTER — Ambulatory Visit: Payer: Medicaid Other

## 2016-04-17 ENCOUNTER — Ambulatory Visit: Payer: Medicaid Other

## 2016-04-24 ENCOUNTER — Ambulatory Visit: Payer: Medicaid Other

## 2016-05-04 ENCOUNTER — Ambulatory Visit
Admission: RE | Admit: 2016-05-04 | Discharge: 2016-05-04 | Disposition: A | Discharge status: Home / Self Care | Payer: Medicaid Other | Source: Ambulatory Visit | Attending: Obstetrics & Gynecology | Admitting: Obstetrics & Gynecology

## 2016-05-04 DIAGNOSIS — Z1231 Encounter for screening mammogram for malignant neoplasm of breast: Secondary | ICD-10-CM

## 2016-05-07 ENCOUNTER — Other Ambulatory Visit: Payer: Self-pay | Admitting: Obstetrics and Gynecology

## 2016-05-07 DIAGNOSIS — N644 Mastodynia: Secondary | ICD-10-CM

## 2016-05-08 ENCOUNTER — Other Ambulatory Visit: Payer: Self-pay | Admitting: Obstetrics and Gynecology

## 2016-05-11 ENCOUNTER — Other Ambulatory Visit: Payer: Medicaid Other

## 2016-05-16 ENCOUNTER — Ambulatory Visit
Admission: RE | Admit: 2016-05-16 | Discharge: 2016-05-16 | Disposition: A | Discharge status: Home / Self Care | Payer: Medicaid Other | Source: Ambulatory Visit | Attending: Obstetrics and Gynecology | Admitting: Obstetrics and Gynecology

## 2016-05-16 DIAGNOSIS — N644 Mastodynia: Secondary | ICD-10-CM

## 2016-07-03 IMAGING — CT CT ANGIO CHEST
2 of 9 series · 18 of 46 positions shown · IV contrast (APPLIED)
Comparison: Chest radiograph performed earlier today at [DATE] p.m.,
and CTA of the chest performed 05/08/2013

CLINICAL DATA: Acute onset of mid chest pain, cough and shortness
of breath. Initial encounter.

EXAM:
CT ANGIOGRAPHY CHEST WITH CONTRAST
TECHNIQUE: Multidetector CT imaging of the chest was performed using the
standard protocol during bolus administration of intravenous
contrast. Multiplanar CT image reconstructions and MIPs were
obtained to evaluate the vascular anatomy.
CONTRAST:  80mL OMNIPAQUE IOHEXOL 350 MG/ML SOLN

[Series 6: thins · axial · 0.68mm/px · z∈[-254,-47]mm · 15 of 233 slices shown]
[im 13/233  lung]
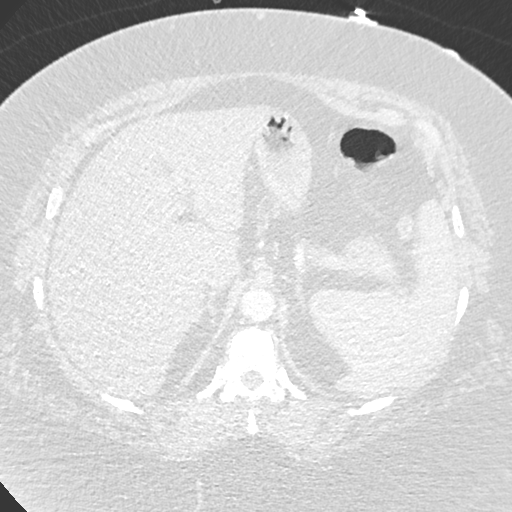
[im 26/233  soft-tissue]
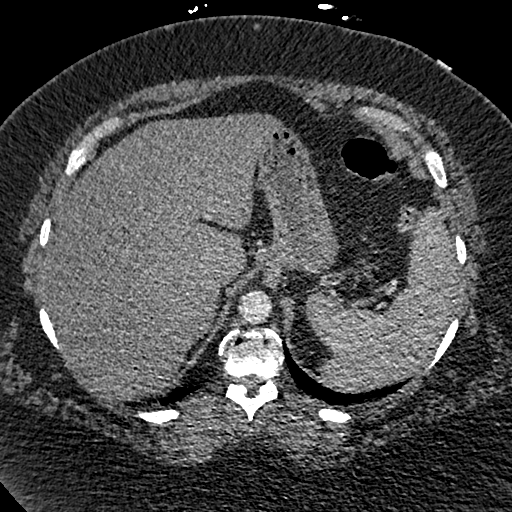
[im 39/233  lung]
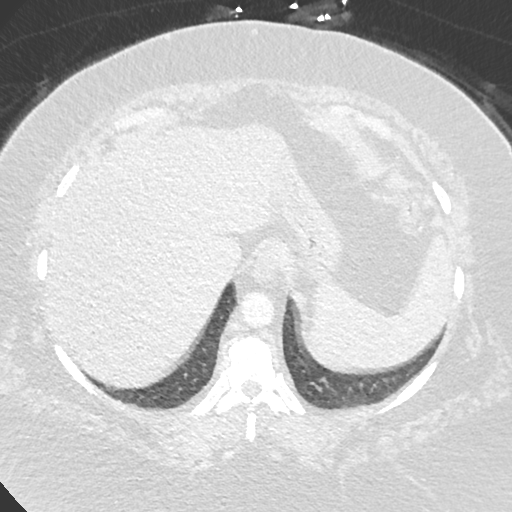
[im 52/233  soft-tissue]
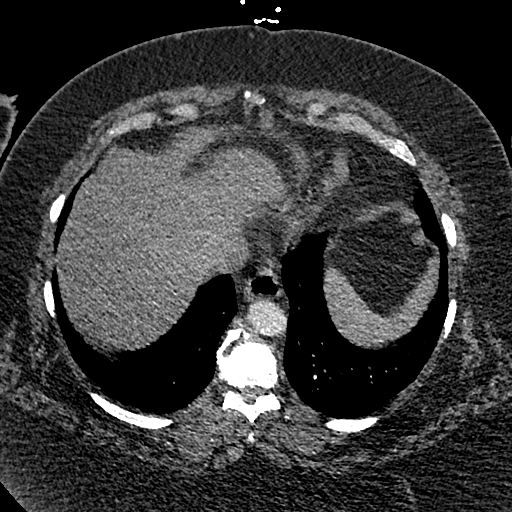
[im 78/233  lung]
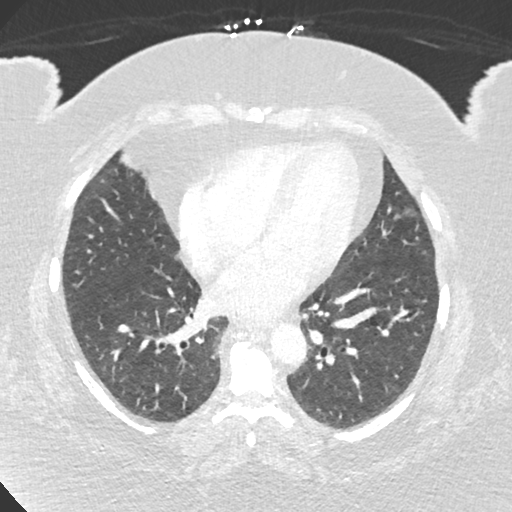
[im 91/233  soft-tissue]
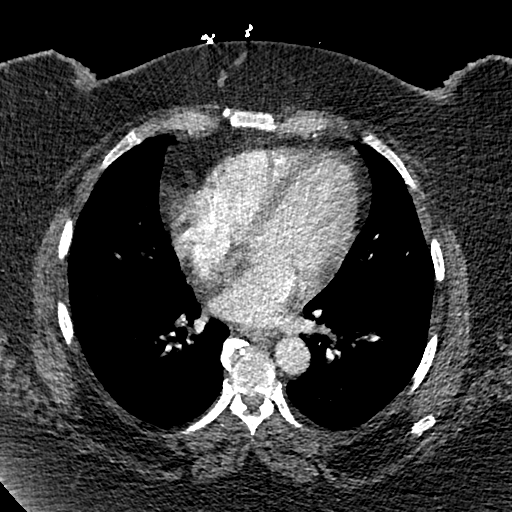
[im 104/233  lung]
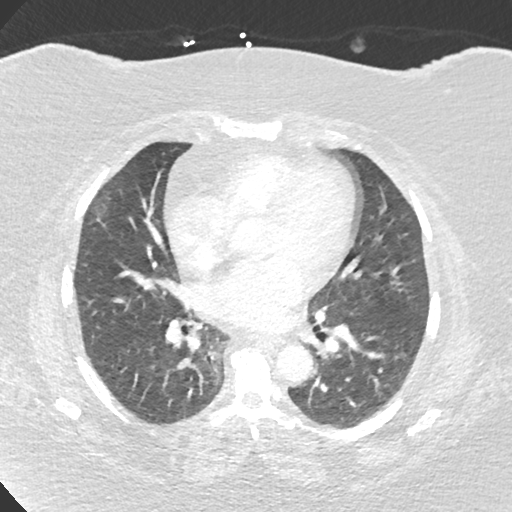
[im 117/233  soft-tissue]
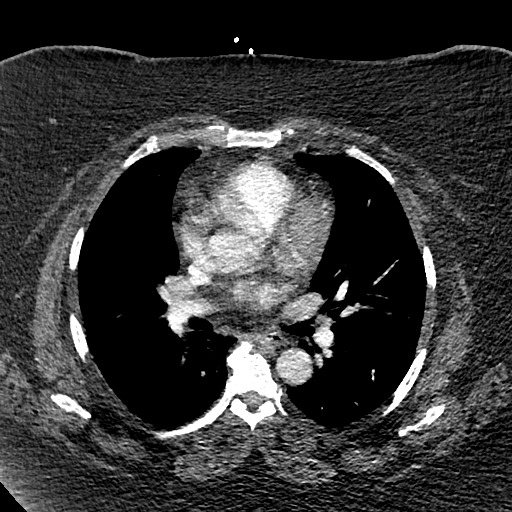
[im 129/233  lung]
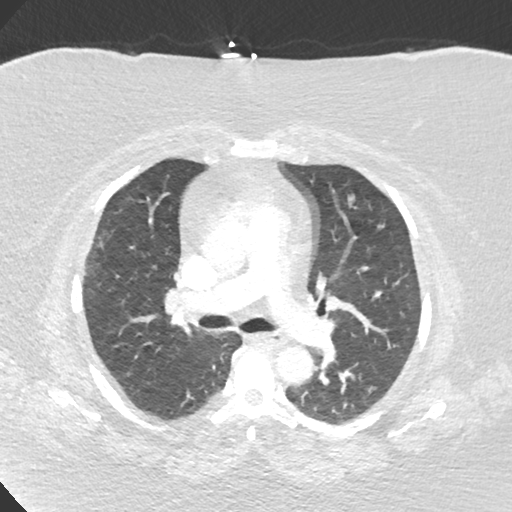
[im 142/233  soft-tissue]
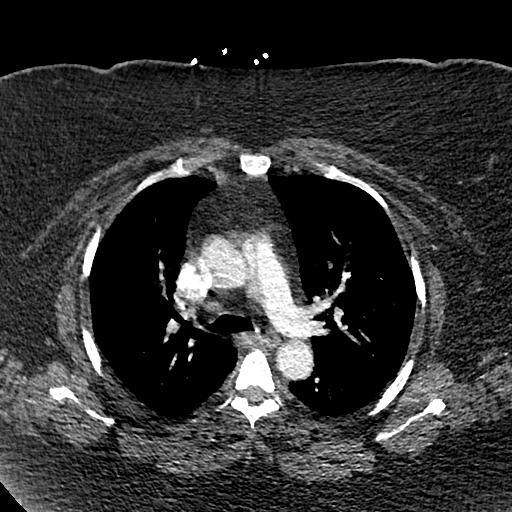
[im 155/233  lung]
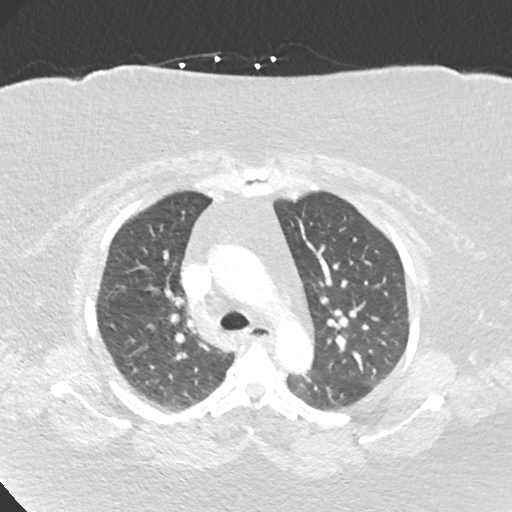
[im 181/233  soft-tissue]
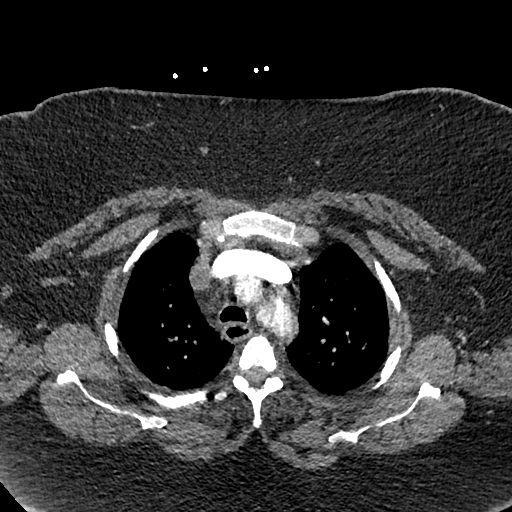
[im 194/233  lung]
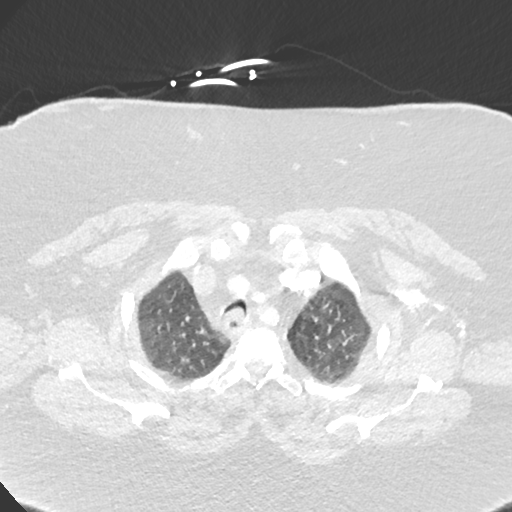
[im 207/233  soft-tissue]
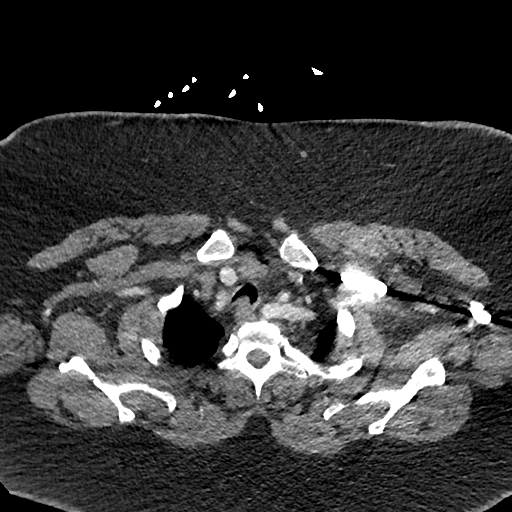
[im 220/233  lung]
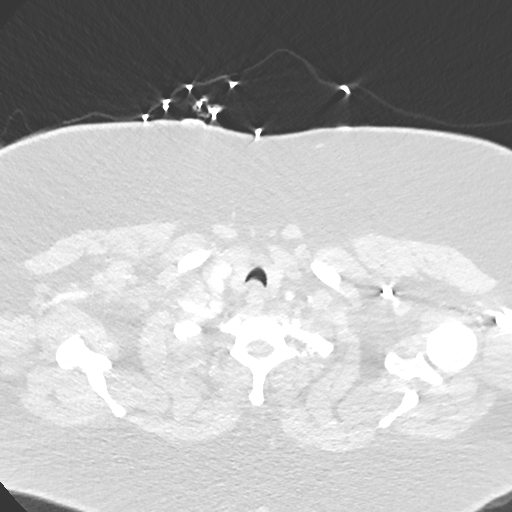

[Series 8: coronal mpr · coronal · 0.49mm/px · 3 of 151 slices shown]
[im 38/151  soft-tissue]
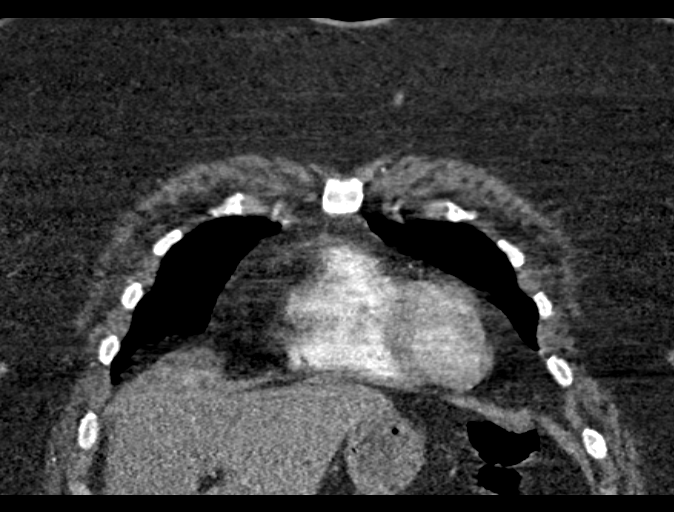
[im 76/151  soft-tissue]
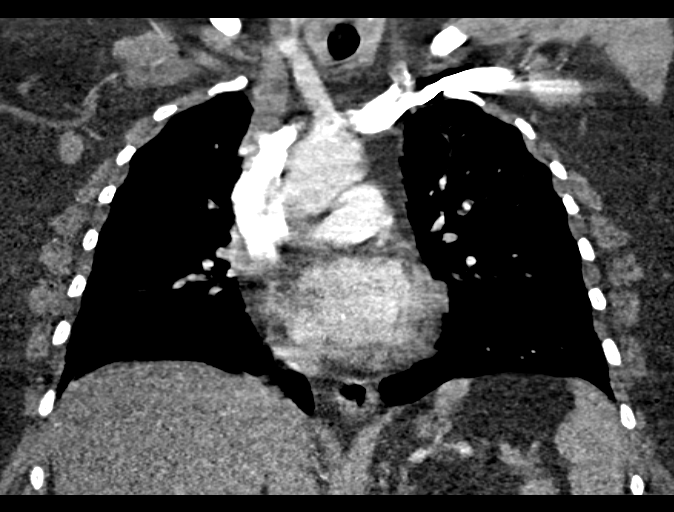
[im 113/151  soft-tissue]
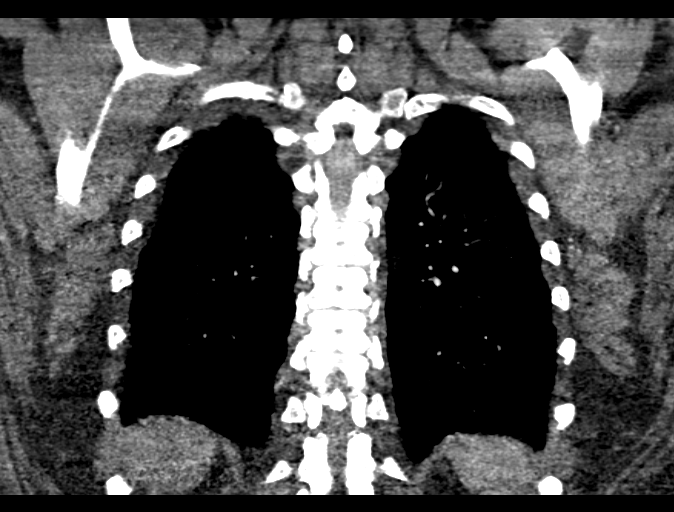

[18 of 46 positions shown; findings below may reference images not displayed]

FINDINGS: There is no evidence of significant pulmonary embolus. Evaluation
for pulmonary embolus is mildly suboptimal due to motion artifact.

The lungs are grossly clear bilaterally. There is no evidence of
significant focal consolidation, pleural effusion or pneumothorax.
No masses are identified; no abnormal focal contrast enhancement is
seen.

The mediastinum is unremarkable appearance. No mediastinal
lymphadenopathy is seen. No pericardial effusion is identified. The
great vessels are grossly unremarkable. No axillary lymphadenopathy
is seen. The visualized portions of the thyroid gland are
unremarkable in appearance.

The visualized portions of the liver and spleen are unremarkable.

No acute osseous abnormalities are seen.

Review of the MIP images confirms the above findings.
IMPRESSION: 1. No evidence of significant pulmonary embolus.
2. Lungs clear bilaterally.

## 2016-07-12 ENCOUNTER — Ambulatory Visit (INDEPENDENT_AMBULATORY_CARE_PROVIDER_SITE_OTHER): Payer: Self-pay | Admitting: Orthopaedic Surgery

## 2016-07-13 ENCOUNTER — Ambulatory Visit (INDEPENDENT_AMBULATORY_CARE_PROVIDER_SITE_OTHER): Payer: Medicaid Other | Admitting: Orthopaedic Surgery

## 2016-07-13 ENCOUNTER — Telehealth (INDEPENDENT_AMBULATORY_CARE_PROVIDER_SITE_OTHER): Payer: Self-pay | Admitting: Orthopaedic Surgery

## 2016-07-13 ENCOUNTER — Encounter (INDEPENDENT_AMBULATORY_CARE_PROVIDER_SITE_OTHER): Payer: Self-pay | Admitting: Orthopaedic Surgery

## 2016-07-13 DIAGNOSIS — G8929 Other chronic pain: Secondary | ICD-10-CM | POA: Insufficient documentation

## 2016-07-13 DIAGNOSIS — M25562 Pain in left knee: Secondary | ICD-10-CM

## 2016-07-13 DIAGNOSIS — M25561 Pain in right knee: Secondary | ICD-10-CM

## 2016-07-13 MED ORDER — DICLOFENAC SODIUM 75 MG PO TBEC: 75.0000 mg | DELAYED_RELEASE_TABLET | Freq: Two times a day (BID) | ORAL | 2 refills | Status: DC

## 2016-07-13 MED ORDER — TRAMADOL HCL 50 MG PO TABS: 50.0000 mg | ORAL_TABLET | Freq: Four times a day (QID) | ORAL | 0 refills | Status: DC | PRN
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
## 2016-07-13 NOTE — Telephone Encounter (Signed)
Pt called to ask if she is suppose to take both prescriptions together that she was prescribed. Stated she is unsure of the directions.  [phone]

## 2016-07-16 NOTE — Telephone Encounter (Signed)
Yes as needed

## 2016-07-16 NOTE — Telephone Encounter (Signed)
Diclofenac and Tramadol?  See message below.

## 2016-07-16 NOTE — Telephone Encounter (Signed)
Called pt to advise

## 2016-09-03 ENCOUNTER — Encounter (HOSPITAL_COMMUNITY): Payer: Self-pay

## 2016-09-03 ENCOUNTER — Emergency Department (HOSPITAL_COMMUNITY): Payer: Medicaid Other

## 2016-09-03 ENCOUNTER — Emergency Department (HOSPITAL_COMMUNITY)
Admission: EM | Admit: 2016-09-03 | Discharge: 2016-09-04 | Disposition: A | Discharge status: Home / Self Care | Payer: Medicaid Other | Attending: Emergency Medicine | Admitting: Emergency Medicine

## 2016-09-03 ENCOUNTER — Telehealth (INDEPENDENT_AMBULATORY_CARE_PROVIDER_SITE_OTHER): Payer: Self-pay

## 2016-09-03 DIAGNOSIS — R739 Hyperglycemia, unspecified: Secondary | ICD-10-CM | POA: Insufficient documentation

## 2016-09-03 DIAGNOSIS — G894 Chronic pain syndrome: Secondary | ICD-10-CM

## 2016-09-03 DIAGNOSIS — R1030 Lower abdominal pain, unspecified: Secondary | ICD-10-CM | POA: Diagnosis not present

## 2016-09-03 LAB — URINALYSIS, ROUTINE W REFLEX MICROSCOPIC
Bilirubin Urine: NEGATIVE
GLUCOSE, UA: NEGATIVE mg/dL
HGB URINE DIPSTICK: NEGATIVE
KETONES UR: NEGATIVE mg/dL
Leukocytes, UA: NEGATIVE
Nitrite: NEGATIVE
PROTEIN: NEGATIVE mg/dL
Specific Gravity, Urine: 1.009 (ref 1.005–1.030)
pH: 6 (ref 5.0–8.0)

## 2016-09-03 LAB — COMPREHENSIVE METABOLIC PANEL
ALBUMIN: 3.5 g/dL (ref 3.5–5.0)
ALK PHOS: 79 U/L (ref 38–126)
ALT: 17 U/L (ref 14–54)
ANION GAP: 8 (ref 5–15)
AST: 17 U/L (ref 15–41)
BILIRUBIN TOTAL: 0.4 mg/dL (ref 0.3–1.2)
BUN: 7 mg/dL (ref 6–20)
CALCIUM: 8.7 mg/dL — AB (ref 8.9–10.3)
CO2: 28 mmol/L (ref 22–32)
Chloride: 99 mmol/L — ABNORMAL LOW (ref 101–111)
Creatinine, Ser: 0.59 mg/dL (ref 0.44–1.00)
GFR calc Af Amer: >60 mL/min (ref >60)
GFR calc non Af Amer: >60 mL/min (ref >60)
GLUCOSE: 190 mg/dL — AB (ref 65–99)
Potassium: 4 mmol/L (ref 3.5–5.1)
SODIUM: 135 mmol/L (ref 135–145)
TOTAL PROTEIN: 7.5 g/dL (ref 6.5–8.1)

## 2016-09-03 LAB — CBC
HCT: 36.2 % (ref 36.0–46.0)
Hemoglobin: 10.6 g/dL — ABNORMAL LOW (ref 12.0–15.0)
MCH: 19.9 pg — AB (ref 26.0–34.0)
MCHC: 29.3 g/dL — AB (ref 30.0–36.0)
MCV: 68 fL — AB (ref 78.0–100.0)
PLATELETS: 355 10*3/uL (ref 150–400)
RBC: 5.32 MIL/uL — ABNORMAL HIGH (ref 3.87–5.11)
RDW: 18.2 % — AB (ref 11.5–15.5)
WBC: 13 10*3/uL — AB (ref 4.0–10.5)

## 2016-09-03 LAB — I-STAT BETA HCG BLOOD, ED (MC, WL, AP ONLY): I-stat hCG, quantitative: <5 m[IU]/mL (ref <5)

## 2016-09-03 LAB — LIPASE, BLOOD: Lipase: 24 U/L (ref 11–51)

## 2016-09-03 MED ORDER — KETOROLAC TROMETHAMINE 15 MG/ML IJ SOLN
15.0000 mg | Freq: Once | INTRAMUSCULAR | Status: AC
  Administered 2016-09-03: 15 mg via INTRAVENOUS
  Filled 2016-09-03: qty 1

## 2016-09-03 MED ORDER — IOPAMIDOL (ISOVUE-300) INJECTION 61%
INTRAVENOUS | Status: AC
  Administered 2016-09-03: 100 mL via INTRAVENOUS
  Filled 2016-09-03: qty 100

## 2016-09-03 NOTE — ED Notes (Signed)
Patient transported to CT 

## 2016-09-03 NOTE — ED Triage Notes (Signed)
Pt reports RLQ abdominal pain that radiates to her back and hip, onset today around 11am associated with nausea but denies vomiting/diarrhea.

## 2016-09-03 NOTE — ED Notes (Signed)
Pt ambulatory to the restroom.  

## 2016-09-03 NOTE — Telephone Encounter (Signed)
Pain management clinic.

## 2016-09-03 NOTE — ED Provider Notes (Signed)
[facility]-EMERGENCY DEPT Provider Note   CSN: [id number] Arrival date & time: 09/03/16  1809     History   Chief Complaint Chief Complaint  Patient presents with  . Abdominal Pain    HPI Rita Bass is a 51 y.o. female.  HPI  Patient is a 51 year old female with past medical history significant for arthritis, nephrolithiasis, obesity, who presents to the emergency department with a 1 day history of bilateral lower abdominal pain. Associated with nausea and urinary urgency. Denies fever, chills, diarrhea, melena. Describes bilateral lower back pain that wraps around to bilateral hips and lower abdomen. Dull, constant, worse with movement. Tried a tramadol with minimal improvement of her symptoms. No recent sick contacts. Last bowel movement today.  Past Medical History:  Diagnosis Date  . Arthritis   . H/O bronchitis   . Kidney stones   . Obesity   . Renal disorder   . Shortness of breath dyspnea     Patient Active Problem List   Diagnosis Date Noted  . Chronic pain of left knee 07/13/2016  . Acute bronchitis 02/04/2015  . Morbid obesity (HCC) 02/04/2015  . Sinus tachycardia 02/04/2015  . Menorrhagia with regular cycle 11/18/2014    Past Surgical History:  Procedure Laterality Date  . CESAREAN SECTION     6 c-sections  . CHOLECYSTECTOMY    . HERNIA REPAIR    . KIDNEY STONE SURGERY    . OOPHORECTOMY    . TUBAL LIGATION      OB History    Gravida Para Term Preterm AB Living   6 6 5 1  0 7   SAB TAB Ectopic Multiple Live Births   0 0 0 2         Home Medications    Prior to Admission medications   Medication Sig Start Date End Date Taking? Authorizing Provider  albuterol (PROVENTIL HFA;VENTOLIN HFA) 108 (90 BASE) MCG/ACT inhaler Inhale 2 puffs into the lungs every 12 (twelve) hours as needed for wheezing or shortness of breath. 02/04/15  Yes Dhungel, Nishant, MD  traMADol (ULTRAM) 50 MG tablet Take 1 tablet (50 mg total) by mouth every 6 (six) hours as  needed. 07/13/16  Yes Tarry KosXu, Naiping M, MD    Family History Family History  Problem Relation Age of Onset  . Breast cancer Mother   . Other Father        committed suicide    Social History Social History  Substance Use Topics  . Smoking status: Never Smoker  . Smokeless tobacco: Never Used  . Alcohol use No     Allergies   Patient has no known allergies.   Review of Systems Review of Systems  Constitutional: Negative for appetite change and fever.  HENT: Negative for congestion.   Respiratory: Negative for cough, chest tightness and shortness of breath.   Cardiovascular: Negative for chest pain.  Gastrointestinal: Positive for abdominal pain and nausea. Negative for blood in stool, diarrhea and vomiting.  Endocrine: Negative for polyuria.  Genitourinary: Positive for urgency. Negative for dysuria, hematuria, vaginal bleeding and vaginal discharge.  Musculoskeletal: Positive for gait problem. Negative for back pain.  Skin: Negative for rash.  Allergic/Immunologic: Negative for immunocompromised state.  Neurological: Negative for dizziness, weakness, light-headedness and numbness.  Psychiatric/Behavioral: Negative for behavioral problems.     Physical Exam Updated Vital Signs BP 135/90   Pulse (!) 101   Temp 98.9 F (37.2 C) (Oral)   Resp 18   LMP 04/05/2016   SpO2 99%  Physical Exam  Constitutional: She is oriented to person, place, and time. She appears well-developed and well-nourished. No distress.  Morbid obese  HENT:  Head: Atraumatic.  Eyes: Conjunctivae and EOM are normal. Pupils are equal, round, and reactive to light.  Neck: Normal range of motion. Neck supple.  Cardiovascular: Normal rate, regular rhythm, normal heart sounds and intact distal pulses.   Pulmonary/Chest: Effort normal and breath sounds normal. No respiratory distress. She has no wheezes.  Abdominal: She exhibits no distension. There is tenderness ( Mild tenderness to palpation to  bilateral lower quadrant.). There is no rebound and no guarding.  Negative McBurney's point, and negative Rovsing sign. No CVA tenderness.  Musculoskeletal: Normal range of motion. She exhibits no edema or deformity.  Neurological: She is alert and oriented to person, place, and time.  Skin: Skin is warm. Capillary refill takes less than 2 seconds. No pallor.  Psychiatric: She has a normal mood and affect.     ED Treatments / Results  Labs (all labs ordered are listed, but only abnormal results are displayed) Labs Reviewed  COMPREHENSIVE METABOLIC PANEL - Abnormal; Notable for the following:       Result Value   Chloride 99 (*)    Glucose, Bld 190 (*)    Calcium 8.7 (*)    All other components within normal limits  CBC - Abnormal; Notable for the following:    WBC 13.0 (*)    RBC 5.32 (*)    Hemoglobin 10.6 (*)    MCV 68.0 (*)    MCH 19.9 (*)    MCHC 29.3 (*)    RDW 18.2 (*)    All other components within normal limits  LIPASE, BLOOD  URINALYSIS, ROUTINE W REFLEX MICROSCOPIC  I-STAT BETA HCG BLOOD, ED ([facility], [facility], [facility] ONLY)    EKG  EKG Interpretation None       Radiology Ct Abdomen Pelvis W Contrast  Result Date: 09/03/2016 CLINICAL DATA:  Bilateral lower abdominal pain.  Nausea. EXAM: CT ABDOMEN AND PELVIS WITH CONTRAST TECHNIQUE: Multidetector CT imaging of the abdomen and pelvis was performed using the standard protocol following bolus administration of intravenous contrast. CONTRAST:  ISOVUE-300 IOPAMIDOL (ISOVUE-300) INJECTION 61% COMPARISON:  CT 08/28/2011 FINDINGS: Lower chest: Minimal lung base atelectasis. No consolidation. No pleural fluid. Hepatobiliary: The liver is enlarged spanning 24.5 cm cranial caudal with decreased density consistent with steatosis. No evidence of focal lesion. Clips in the gallbladder fossa postcholecystectomy. No biliary dilatation. Pancreas: No ductal dilatation or inflammation. Spleen: Prominent in size spanning 13.7 cm AP. No focal  abnormality. Adrenals/Urinary Tract: Normal adrenal glands. No hydronephrosis or perinephric edema. Mild lobular contours of the left kidney. Homogeneous enhancement with symmetric excretion on delayed phase imaging. No evident renal stones. Urinary bladder is minimally distended without wall thickening. Stomach/Bowel: Stomach is decompressed. Small bowel is decompressed. No evidence of bowel wall thickening, obstruction or inflammation. Moderate stool in the proximal colon were transverse colonic tortuosity. No colonic wall thickening. No colonic inflammation. Appendix not confidently visualized, no pericecal or right lower quadrant inflammation to suggest appendicitis. Vascular/Lymphatic: No significant vascular findings are present. No enlarged abdominal or pelvic lymph nodes. Reproductive: Uterus and bilateral adnexa are unremarkable. Other: Complex fat containing abdominal wall hernia involving the periumbilical region. No bowel involvement, and a transverse colon approaches the hernia sac without involvement. No free air, free fluid, or intra-abdominal fluid collection. Musculoskeletal: Degenerative change throughout spine. Degenerative disc disease at L5-S1 with disc space narrowing and vacuum phenomenon. Facet  arthropathy in the lower lumbar spine. There are no acute or suspicious osseous abnormalities. IMPRESSION: 1. No explanation for abdominal pain.  No acute abnormality. 2. Hepatomegaly and hepatic steatosis.  Mild splenomegaly. Electronically Signed   By: Rubye Oaks M.D.   On: 09/03/2016 23:54    Procedures Procedures (including critical care time)  Medications Ordered in ED Medications  ketorolac (TORADOL) 15 MG/ML injection 15 mg (15 mg Intravenous Given 09/03/16 2236)  iopamidol (ISOVUE-300) 61 % injection (100 mLs Intravenous Contrast Given 09/03/16 2303)     Initial Impression / Assessment and Plan / ED Course  I have reviewed the triage vital signs and the nursing  notes.  Pertinent labs & imaging results that were available during my care of the patient were reviewed by me and considered in my medical decision making (see chart for details).     51 year old female with past medical history significant for morbid obesity, prior cholecystectomy, prior hernia repair, who presents to the emergency department with bilateral lower abdominal pain associated with nausea. On arrival no acute distress, not ill appearing. Afebrile, hemodynamically stable. Mildly tender lower abdomen without peritonitis.  Pregnancy negative. WBC 13. Hemoglobin stable. No significant electrolyte abnormalities. UA showed no blood, no signs of infectious process. Lipase within normal limits.  Doubt pyelonephritis, no emesis, no fever, no infectious signs on UA. Doubt TOA or ovarian torsion, not sudden onset, not sharp and stabbing, bilateral, no vaginal discharge or history of STI. Given concern for possible appendicitis will obtain a CT scan. Given IV pain medication.  CT showed no acute findings. No signs of enlarged adnexa or cyst. Normal appendix. Hepatic steatosis.  Discussed the findings with the patient. Possible musculoskeletal pain.  Stable for discharge home. Patient will follow-up with her primary care physician. Given strict return precautions to the emergency department.  Final Clinical Impressions(s) / ED Diagnoses   Final diagnoses:  Lower abdominal pain    New Prescriptions New Prescriptions   No medications on file     Corena Herter, MD 09/04/16 Claudean Kinds, MD 09/05/16 [phone]

## 2016-09-03 NOTE — Telephone Encounter (Signed)
Please advise. Would you like for me to bring patient in for another appt?

## 2016-09-03 NOTE — Telephone Encounter (Signed)
Patient stated that tramadol is not helping.  Would like to know if she can get a higher dose or another Rx that will help with the pain?  States that she has a knot on the inner side of her right knee as well and would like to know what she should do? CB# is [phone].  Please Advise

## 2016-09-04 MED ORDER — ONDANSETRON HCL 4 MG PO TABS: 4.0000 mg | ORAL_TABLET | Freq: Three times a day (TID) | ORAL | 0 refills | Status: DC | PRN

## 2016-09-05 NOTE — Telephone Encounter (Signed)
IC s/w patient. Referral made

## 2016-09-05 NOTE — Addendum Note (Signed)
Addended byPrescott Parma: Hermila Millis on: 09/05/2016 06:04 PM   Modules accepted: Orders

## 2016-11-19 ENCOUNTER — Emergency Department (HOSPITAL_COMMUNITY)
Admission: EM | Admit: 2016-11-19 | Discharge: 2016-11-20 | Disposition: A | Discharge status: Home / Self Care | Payer: Medicaid Other | Attending: Emergency Medicine | Admitting: Emergency Medicine

## 2016-11-19 ENCOUNTER — Encounter (HOSPITAL_COMMUNITY): Payer: Self-pay | Admitting: Emergency Medicine

## 2016-11-19 DIAGNOSIS — R112 Nausea with vomiting, unspecified: Secondary | ICD-10-CM | POA: Diagnosis not present

## 2016-11-19 DIAGNOSIS — R51 Headache: Secondary | ICD-10-CM | POA: Insufficient documentation

## 2016-11-19 DIAGNOSIS — R1033 Periumbilical pain: Secondary | ICD-10-CM | POA: Diagnosis present

## 2016-11-19 DIAGNOSIS — Z79899 Other long term (current) drug therapy: Secondary | ICD-10-CM | POA: Insufficient documentation

## 2016-11-19 LAB — COMPREHENSIVE METABOLIC PANEL
ALK PHOS: 87 U/L (ref 38–126)
ALT: 22 U/L (ref 14–54)
AST: 49 U/L — ABNORMAL HIGH (ref 15–41)
Albumin: 3.3 g/dL — ABNORMAL LOW (ref 3.5–5.0)
Anion gap: 9 (ref 5–15)
BILIRUBIN TOTAL: 1.4 mg/dL — AB (ref 0.3–1.2)
BUN: 12 mg/dL (ref 6–20)
CO2: 25 mmol/L (ref 22–32)
Calcium: 9.2 mg/dL (ref 8.9–10.3)
Chloride: 99 mmol/L — ABNORMAL LOW (ref 101–111)
Creatinine, Ser: 0.73 mg/dL (ref 0.44–1.00)
GFR calc Af Amer: >60 mL/min (ref >60)
GLUCOSE: 310 mg/dL — AB (ref 65–99)
POTASSIUM: 4.7 mmol/L (ref 3.5–5.1)
Sodium: 133 mmol/L — ABNORMAL LOW (ref 135–145)
TOTAL PROTEIN: 7.1 g/dL (ref 6.5–8.1)

## 2016-11-19 LAB — CBC
HEMATOCRIT: 35.3 % — AB (ref 36.0–46.0)
HEMOGLOBIN: 10.3 g/dL — AB (ref 12.0–15.0)
MCH: 20 pg — ABNORMAL LOW (ref 26.0–34.0)
MCHC: 29.2 g/dL — AB (ref 30.0–36.0)
MCV: 68.5 fL — AB (ref 78.0–100.0)
Platelets: 325 10*3/uL (ref 150–400)
RBC: 5.15 MIL/uL — ABNORMAL HIGH (ref 3.87–5.11)
RDW: 17.8 % — AB (ref 11.5–15.5)
WBC: 13.2 10*3/uL — ABNORMAL HIGH (ref 4.0–10.5)

## 2016-11-19 LAB — URINALYSIS, ROUTINE W REFLEX MICROSCOPIC
Bilirubin Urine: NEGATIVE
HGB URINE DIPSTICK: NEGATIVE
Ketones, ur: NEGATIVE mg/dL
Leukocytes, UA: NEGATIVE
NITRITE: NEGATIVE
Protein, ur: 30 mg/dL — AB
Specific Gravity, Urine: 1.015 (ref 1.005–1.030)
pH: 5 (ref 5.0–8.0)

## 2016-11-19 LAB — LIPASE, BLOOD: Lipase: 30 U/L (ref 11–51)

## 2016-11-19 LAB — PREGNANCY, URINE: Preg Test, Ur: NEGATIVE

## 2016-11-19 MED ORDER — ONDANSETRON HCL 4 MG/2ML IJ SOLN
4.0000 mg | Freq: Once | INTRAMUSCULAR | Status: AC | PRN
  Administered 2016-11-19: 4 mg via INTRAVENOUS
  Filled 2016-11-19: qty 2

## 2016-11-19 MED ORDER — FENTANYL CITRATE (PF) 100 MCG/2ML IJ SOLN
50.0000 ug | Freq: Once | INTRAMUSCULAR | Status: AC
  Administered 2016-11-19: 50 ug via INTRAVENOUS
  Filled 2016-11-19: qty 2

## 2016-11-19 MED ORDER — IOPAMIDOL (ISOVUE-300) INJECTION 61%
INTRAVENOUS | Status: AC
  Administered 2016-11-20: 100 mL via INTRAVENOUS
  Filled 2016-11-19: qty 100

## 2016-11-19 MED ORDER — SODIUM CHLORIDE 0.9 % IV BOLUS (SEPSIS)
1000.0000 mL | Freq: Once | INTRAVENOUS | Status: AC
  Administered 2016-11-19: 1000 mL via INTRAVENOUS

## 2016-11-19 NOTE — ED Provider Notes (Signed)
[facility]PT Provider Note   CSN: [id number] Arrival date & time: 11/19/16  2023     History   Chief Complaint Chief Complaint  Patient presents with  . Abdominal Pain    HPI Rita Bass is a 51 y.o. female.  51 year old female with past medical history including kidney stones  who presents with abdominal pain. She reports 2 days of headache, was taking ibuprofen for it. This evening around 6-7pm, she had a normal BM and was walking back when she felt like she was punched in the stomach. Pain is mid to upper abdomen, constant, with occasional sharp pain in left side and umbilicus. She reports multiple episodes of vomiting. No diarrhea, constipation, bloody stools, urinary sx, fevers, sick contacts. She reports similar pain previously at her last ER visit.    The history is provided by the patient.  Abdominal Pain      Past Medical History:  Diagnosis Date  . Arthritis   . H/O bronchitis   . Kidney stones   . Obesity   . Renal disorder   . Shortness of breath dyspnea     Patient Active Problem List   Diagnosis Date Noted  . Chronic pain of left knee 07/13/2016  . Acute bronchitis 02/04/2015  . Morbid obesity (HCC) 02/04/2015  . Sinus tachycardia 02/04/2015  . Menorrhagia with regular cycle 11/18/2014    Past Surgical History:  Procedure Laterality Date  . CESAREAN SECTION     6 c-sections  . CHOLECYSTECTOMY    . HERNIA REPAIR    . KIDNEY STONE SURGERY    . OOPHORECTOMY    . TUBAL LIGATION      OB History    Gravida Para Term Preterm AB Living   6 6 5 1  0 7   SAB TAB Ectopic Multiple Live Births   0 0 0 2         Home Medications    Prior to Admission medications   Medication Sig Start Date End Date Taking? Authorizing Provider  albuterol (PROVENTIL HFA;VENTOLIN HFA) 108 (90 BASE) MCG/ACT inhaler Inhale 2 puffs into the lungs every 12 (twelve) hours as needed for wheezing or shortness of breath. 02/04/15  Yes Dhungel, Nishant, MD    ibuprofen (ADVIL,MOTRIN) 200 MG tablet Take 400 mg by mouth every 6 (six) hours as needed for moderate pain.   Yes [provider]    Family History Family History  Problem Relation Age of Onset  . Breast cancer Mother   . Other Father        committed suicide    Social History Social History  Substance Use Topics  . Smoking status: Never Smoker  . Smokeless tobacco: Never Used  . Alcohol use No     Allergies   Patient has no known allergies.   Review of Systems Review of Systems  Gastrointestinal: Positive for abdominal pain.   All other systems reviewed and are negative except that which was mentioned in HPI   Physical Exam Updated Vital Signs BP 120/73   Pulse (!) 102   Temp 98 F (36.7 C) (Oral)   Resp (!) 24   Ht 5\' 4"  (1.626 m)   Wt (!) 160.6 kg (354 lb)   SpO2 94%   BMI 60.76 kg/m   Physical Exam  Constitutional: She is oriented to person, place, and time. She appears well-developed and well-nourished. No distress.  uncomfortable  HENT:  Head: Normocephalic and atraumatic.  Mouth/Throat: Oropharynx is clear and moist.  Moist mucous membranes  Eyes: Pupils are equal, round, and reactive to light. Conjunctivae are normal.  Neck: Neck supple.  Cardiovascular: Normal rate, regular rhythm and normal heart sounds.   No murmur heard. Pulmonary/Chest: Effort normal and breath sounds normal.  Abdominal: Soft. Bowel sounds are normal. She exhibits no distension. There is tenderness. There is no rebound and no guarding. No hernia.  Tenderness of central abdomen just above umbilicus with area of firmness, no appreciable mass or ventral hernia  Musculoskeletal: She exhibits no edema.  Neurological: She is alert and oriented to person, place, and time.  Fluent speech  Skin: Skin is warm and dry.  Psychiatric: She has a normal mood and affect. Judgment normal.  Nursing note and vitals reviewed.    ED Treatments / Results  Labs (all labs ordered  are listed, but only abnormal results are displayed) Labs Reviewed  COMPREHENSIVE METABOLIC PANEL - Abnormal; Notable for the following:       Result Value   Sodium 133 (*)    Chloride 99 (*)    Glucose, Bld 310 (*)    Albumin 3.3 (*)    AST 49 (*)    Total Bilirubin 1.4 (*)    All other components within normal limits  URINALYSIS, ROUTINE W REFLEX MICROSCOPIC - Abnormal; Notable for the following:    APPearance HAZY (*)    Glucose, UA >=500 (*)    Protein, ur 30 (*)    Bacteria, UA RARE (*)    Squamous Epithelial / LPF 0-5 (*)    All other components within normal limits  CBC - Abnormal; Notable for the following:    WBC 13.2 (*)    RBC 5.15 (*)    Hemoglobin 10.3 (*)    HCT 35.3 (*)    MCV 68.5 (*)    MCH 20.0 (*)    MCHC 29.2 (*)    RDW 17.8 (*)    All other components within normal limits  LIPASE, BLOOD  PREGNANCY, URINE    EKG  EKG Interpretation None       Radiology No results found.  Procedures Procedures (including critical care time)  Medications Ordered in ED Medications  iopamidol (ISOVUE-300) 61 % injection (not administered)  ondansetron (ZOFRAN) injection 4 mg (4 mg Intravenous Given 11/19/16 2059)  sodium chloride 0.9 % bolus 1,000 mL (1,000 mLs Intravenous New Bag/Given 11/19/16 2218)  fentaNYL (SUBLIMAZE) injection 50 mcg (50 mcg Intravenous Given 11/19/16 2218)     Initial Impression / Assessment and Plan / ED Course  I have reviewed the triage vital signs and the nursing notes.  Pertinent labs & imaging results that were available during my care of the patient were reviewed by me and considered in my medical decision making (see chart for details).     Pt w/ central abdominal pain beginning tonight associated w/ N/V. She does have hx of multiple abdominal surgeries. She was Nontoxic and in no acute distress on exam. Vital signs reassuring. She had central abdominal tenderness above umbilicus.  Labs show WBC 13.2, hemoglobin 10.3, UA negative  for infection, AST 49, bilirubin 1.4, normal lipase. Because of her multiple previous abdominal surgeries, obtained CT of abdomen and pelvis to rule out obstruction or other acute process. I'm signing the patient out to the oncoming provider who will follow-up on CT imaging. I given the patient IV fluids, Zofran, and fentanyl. Final Clinical Impressions(s) / ED Diagnoses   Final diagnoses:  None    New Prescriptions New Prescriptions  No medications on file     Estiven Kohan, Ambrose Finland, MD 11/20/16 [phone]

## 2016-11-19 NOTE — ED Notes (Signed)
Urine requested  ?

## 2016-11-20 ENCOUNTER — Emergency Department (HOSPITAL_COMMUNITY): Payer: Medicaid Other

## 2016-11-20 MED ORDER — IBUPROFEN 400 MG PO TABS
600.0000 mg | ORAL_TABLET | Freq: Once | ORAL | Status: AC
  Administered 2016-11-20: 03:00:00 600 mg via ORAL
  Filled 2016-11-20: qty 1

## 2016-11-20 NOTE — ED Notes (Signed)
Patient transported to MRI 

## 2016-11-20 NOTE — ED Provider Notes (Signed)
2:27 AM Patient signed out pending CT scan. CT scan is largely unremarkable. Discussed the results with the patient. She does report some improvement of pain. She is able to tolerate fluids. Discharge home with primary care follow-up. Patient was given strict return precautions.   Shon BatonHorton, Dominiqua Cooner F, MD 11/20/16 [phone]

## 2017-04-13 ENCOUNTER — Emergency Department (HOSPITAL_COMMUNITY)
Admission: EM | Admit: 2017-04-13 | Discharge: 2017-04-13 | Disposition: A | Discharge status: Home / Self Care | Payer: Medicaid Other | Attending: Emergency Medicine | Admitting: Emergency Medicine

## 2017-04-13 ENCOUNTER — Emergency Department (HOSPITAL_COMMUNITY): Payer: Medicaid Other

## 2017-04-13 ENCOUNTER — Encounter (HOSPITAL_COMMUNITY): Payer: Self-pay | Admitting: Emergency Medicine

## 2017-04-13 DIAGNOSIS — R0789 Other chest pain: Secondary | ICD-10-CM | POA: Insufficient documentation

## 2017-04-13 DIAGNOSIS — J069 Acute upper respiratory infection, unspecified: Secondary | ICD-10-CM | POA: Insufficient documentation

## 2017-04-13 MED ORDER — ACETAMINOPHEN 325 MG PO TABS
650.0000 mg | ORAL_TABLET | Freq: Once | ORAL | Status: AC
  Administered 2017-04-13: 650 mg via ORAL
  Filled 2017-04-13: qty 2

## 2017-04-13 MED ORDER — ALBUTEROL SULFATE HFA 108 (90 BASE) MCG/ACT IN AERS
2.0000 | INHALATION_SPRAY | RESPIRATORY_TRACT | Status: DC | PRN
  Administered 2017-04-13: 2 via RESPIRATORY_TRACT
  Filled 2017-04-13: qty 6.7

## 2017-04-13 MED ORDER — ALBUTEROL SULFATE (2.5 MG/3ML) 0.083% IN NEBU
5.0000 mg | INHALATION_SOLUTION | Freq: Once | RESPIRATORY_TRACT | Status: AC
  Administered 2017-04-13: 5 mg via RESPIRATORY_TRACT
  Filled 2017-04-13: qty 6

## 2017-04-13 MED ORDER — PROMETHAZINE-DM 6.25-15 MG/5ML PO SYRP: 5.0000 mL | ORAL_SOLUTION | Freq: Four times a day (QID) | ORAL | 0 refills | Status: DC | PRN

## 2017-04-13 MED ORDER — BENZONATATE 100 MG PO CAPS
100.0000 mg | ORAL_CAPSULE | Freq: Once | ORAL | Status: AC
  Administered 2017-04-13: 100 mg via ORAL
  Filled 2017-04-13: qty 1

## 2017-04-13 MED ORDER — IPRATROPIUM BROMIDE 0.02 % IN SOLN
0.5000 mg | Freq: Once | RESPIRATORY_TRACT | Status: AC
  Administered 2017-04-13: 0.5 mg via RESPIRATORY_TRACT
  Filled 2017-04-13: qty 2.5

## 2017-04-13 NOTE — ED Notes (Signed)
Pt stable, ambulatory, and verbalizes understanding of the d/c instructions.

## 2017-04-13 NOTE — Discharge Instructions (Addendum)
As discussed, have a primary care provider recheck your blood pressure in office. Take cough medicine as needed, do not drive or operate machinery while taking this medication. Make sure that you stay well-hydrated drinking enough fluids to keep your urine clear.  Return if symptoms worsen or new concerning symptoms in the meantime.

## 2017-04-13 NOTE — ED Triage Notes (Signed)
Pt presents to ED for assessment cough and congestion x 5 days, with CP starting 2 days ago with back pain after a coughing episode.  Pt states pain is a generalized ache to chest and back.  Denies SOB, denies n/v/d, denies other pain

## 2017-04-13 NOTE — ED Provider Notes (Signed)
[facility] Provider Note   CSN: [id number] Arrival date & time: 04/13/17  1456     History   Chief Complaint Chief Complaint  Patient presents with  . Cough  . Chest Pain    HPI Rita Bass is a 52 y.o. female presenting with 5 days of nonproductive cough.  Reports 3 days of discomfort in her chest when coughing no chest pain otherwise.  She has tried over-the-counter medications including Dimetapp and formula 44 without relief.  Denies any known ill contacts.  Patient has received her flu shot this year.  Denies any fever, chills, nausea, vomiting, diarrhea, abdominal pain, myalgias. Patient has not been followed by PCP.  Last seen 2 years ago in clinic.  HPI  Past Medical History:  Diagnosis Date  . Arthritis   . H/O bronchitis   . Kidney stones   . Obesity   . Renal disorder   . Shortness of breath dyspnea     Patient Active Problem List   Diagnosis Date Noted  . Chronic pain of left knee 07/13/2016  . Acute bronchitis 02/04/2015  . Morbid obesity (HCC) 02/04/2015  . Sinus tachycardia 02/04/2015  . Menorrhagia with regular cycle 11/18/2014    Past Surgical History:  Procedure Laterality Date  . CESAREAN SECTION     6 c-sections  . CHOLECYSTECTOMY    . HERNIA REPAIR    . KIDNEY STONE SURGERY    . OOPHORECTOMY    . TUBAL LIGATION      OB History    Gravida Para Term Preterm AB Living   6 6 5 1  0 7   SAB TAB Ectopic Multiple Live Births   0 0 0 2         Home Medications    Prior to Admission medications   Medication Sig Start Date End Date Taking? Authorizing Provider  albuterol (PROVENTIL HFA;VENTOLIN HFA) 108 (90 BASE) MCG/ACT inhaler Inhale 2 puffs into the lungs every 12 (twelve) hours as needed for wheezing or shortness of breath. 02/04/15   Dhungel, Theda BelfastNishant, MD  ibuprofen (ADVIL,MOTRIN) 200 MG tablet Take 400 mg by mouth every 6 (six) hours as needed for moderate pain.    [provider]    promethazine-dextromethorphan (PROMETHAZINE-DM) 6.25-15 MG/5ML syrup Take 5 mLs by mouth 4 (four) times daily as needed for cough. 04/13/17   Georgiana ShoreMitchell, Jessica B, PA-C    Family History Family History  Problem Relation Age of Onset  . Breast cancer Mother   . Other Father        committed suicide    Social History Social History   Tobacco Use  . Smoking status: Never Smoker  . Smokeless tobacco: Never Used  Substance Use Topics  . Alcohol use: No  . Drug use: No     Allergies   Patient has no known allergies.   Review of Systems Review of Systems  Constitutional: Negative for chills, diaphoresis, fatigue and fever.  HENT: Positive for congestion and sinus pressure. Negative for ear pain, sore throat, tinnitus, trouble swallowing and voice change.   Respiratory: Positive for cough, chest tightness and shortness of breath. Negative for choking, wheezing and stridor.        Chest and back pain when coughing  Cardiovascular: Negative for chest pain, palpitations and leg swelling.  Gastrointestinal: Negative for abdominal distention, abdominal pain, diarrhea, nausea and vomiting.  Genitourinary: Negative for difficulty urinating, dysuria and hematuria.  Musculoskeletal: Negative for arthralgias, back pain, myalgias, neck  pain and neck stiffness.  Skin: Negative for color change, pallor, rash and wound.  Neurological: Negative for dizziness, seizures, syncope, light-headedness and headaches.     Physical Exam Updated Vital Signs BP (!) 141/76   Pulse 92   Temp 97.7 F (36.5 C) (Oral)   Resp 20   SpO2 98%   Physical Exam  Constitutional: She appears well-developed and well-nourished.  Non-toxic appearance. She does not appear ill. No distress.  Morbidly obese, afebrile, nontoxic-appearing, sitting comfortably in bed no acute distress.  HENT:  Head: Normocephalic and atraumatic.  Eyes: Conjunctivae and EOM are normal.  Neck: Normal range of motion. Neck supple.   Cardiovascular: Normal rate and regular rhythm.  No murmur heard. Pulmonary/Chest: Effort normal and breath sounds normal. No accessory muscle usage or stridor. No tachypnea. No respiratory distress. She has no decreased breath sounds.  Abdominal: She exhibits no distension.  Musculoskeletal: She exhibits no edema.       Right lower leg: Normal. She exhibits no edema.       Left lower leg: Normal. She exhibits no edema.  Neurological: She is alert.  Skin: Skin is warm and dry. No rash noted. She is not diaphoretic. No erythema. No pallor.  Psychiatric: She has a normal mood and affect.  Nursing note and vitals reviewed.    ED Treatments / Results  Labs (all labs ordered are listed, but only abnormal results are displayed) Labs Reviewed - No data to display  EKG  EKG Interpretation None       Radiology Dg Chest 2 View  Result Date: 04/13/2017 CLINICAL DATA:  Cough, congestion, chest pain EXAM: CHEST  2 VIEW COMPARISON:  02/03/2015 FINDINGS: Upper normal heart size. Mediastinal contours and pulmonary vascularity normal. Lungs clear. No pleural effusion or pneumothorax. Bones unremarkable. IMPRESSION: No acute abnormalities. Electronically Signed   By: Ulyses Southward M.D.   On: 04/13/2017 15:54    Procedures Procedures (including critical care time)  Medications Ordered in ED Medications  albuterol (PROVENTIL HFA;VENTOLIN HFA) 108 (90 Base) MCG/ACT inhaler 2 puff (2 puffs Inhalation Given 04/13/17 1917)  albuterol (PROVENTIL) (2.5 MG/3ML) 0.083% nebulizer solution 5 mg (5 mg Nebulization Given 04/13/17 1756)  ipratropium (ATROVENT) nebulizer solution 0.5 mg (0.5 mg Nebulization Given 04/13/17 1756)  benzonatate (TESSALON) capsule 100 mg (100 mg Oral Given 04/13/17 1755)  acetaminophen (TYLENOL) tablet 650 mg (650 mg Oral Given 04/13/17 1917)     Initial Impression / Assessment and Plan / ED Course  I have reviewed the triage vital signs and the nursing notes.  Pertinent labs &  imaging results that were available during my care of the patient were reviewed by me and considered in my medical decision making (see chart for details).    Patient presented with cough for 5 days and congestion.  Denies any chest pain unless she is coughing.  She has tried over-the-counter medications with modest relief.  Patient was satting 97% on room air on my assessments. SPO2 dropping when patient move the leads and lose reading.  She is speaking in full sentences in no respiratory distress with lungs clear to auscultation bilaterally.  Chest x-Gabino Hagin negative.  Improved after nebulizing treatment but developed mild headache and was given Tylenol.  Urged patient to follow-up with primary care for reevaluation of blood pressure.  Patient is morbidly obese and I question the accuracy of some of the blood pressure reading in the emergency department as patient kept moving her cuff around during readings.  Blood pressure had  to be taken in the forearm.  Patient was well-appearing, hemodynamically stable ready for discharge.   Will discharge home with symptomatic relief and follow-up with PCP.  Discussed strict return precautions and advised to return to the emergency department if experiencing any new or worsening symptoms. Instructions were understood and patient agreed with discharge plan.  Final Clinical Impressions(s) / ED Diagnoses   Final diagnoses:  Chest tightness  URI with cough and congestion    ED Discharge Orders        Ordered    promethazine-dextromethorphan (PROMETHAZINE-DM) 6.25-15 MG/5ML syrup  4 times daily PRN     04/13/17 1913       Gregary Cromer 04/13/17 2213    Margarita Grizzle, MD 04/13/17 [phone]

## 2017-05-30 ENCOUNTER — Telehealth (INDEPENDENT_AMBULATORY_CARE_PROVIDER_SITE_OTHER): Payer: Self-pay | Admitting: Physician Assistant

## 2017-05-30 ENCOUNTER — Ambulatory Visit (INDEPENDENT_AMBULATORY_CARE_PROVIDER_SITE_OTHER): Payer: Medicaid Other | Admitting: Physician Assistant

## 2017-05-30 ENCOUNTER — Encounter (INDEPENDENT_AMBULATORY_CARE_PROVIDER_SITE_OTHER): Payer: Self-pay | Admitting: Physician Assistant

## 2017-05-30 ENCOUNTER — Ambulatory Visit (INDEPENDENT_AMBULATORY_CARE_PROVIDER_SITE_OTHER): Payer: Medicaid Other

## 2017-05-30 DIAGNOSIS — M25561 Pain in right knee: Secondary | ICD-10-CM

## 2017-05-30 DIAGNOSIS — G8929 Other chronic pain: Secondary | ICD-10-CM

## 2017-05-30 MED ORDER — METHYLPREDNISOLONE ACETATE 40 MG/ML IJ SUSP
40.0000 mg | INTRAMUSCULAR | Status: AC | PRN
  Administered 2017-05-30: 40 mg via INTRA_ARTICULAR

## 2017-05-30 MED ORDER — LIDOCAINE HCL 1 % IJ SOLN
2.0000 mL | INTRAMUSCULAR | Status: AC | PRN
  Administered 2017-05-30: 2 mL

## 2017-05-30 MED ORDER — BUPIVACAINE HCL 0.25 % IJ SOLN
2.0000 mL | INTRAMUSCULAR | Status: AC | PRN
  Administered 2017-05-30: 2 mL via INTRA_ARTICULAR

## 2017-05-30 MED ORDER — TRAMADOL HCL 50 MG PO TABS: 100.0000 mg | ORAL_TABLET | Freq: Four times a day (QID) | ORAL | 1 refills | Status: DC | PRN

## 2017-05-30 NOTE — Progress Notes (Signed)
Office Visit Note   Patient: Rita HockingDebbie Bass           Date of Birth: Jul 04, 1965           MRN: [id number] Visit Date: 05/30/2017              Requested by: Adam PhenixArnold, James G, MD [address] [place], [place] [id number] PCP: Adam PhenixArnold, James G, MD   Assessment & Plan: Visit Diagnoses:  1. Chronic pain of right knee     Plan: Impression is right knee primary localized arthritis.  Today, we will inject her right knee with cortisone.  She will rest ice and elevate as much possible today and tomorrow.  I have counseled her on the need to lose weight.  I have given her one prescription for tramadol.  She will follow-up with us on an as-needed basis.  Follow-Up Instructions: Return if symptoms worsen or fail to improve.   Orders:  Orders Placed This Encounter  Procedures  . XR KNEE 3 VIEW RIGHT   Meds ordered this encounter  Medications  . traMADol (ULTRAM) 50 MG tablet    Sig: Take 2 tablets (100 mg total) by mouth every 6 (six) hours as needed.    Dispense:  30 tablet    Refill:  1      Procedures: Large Joint Inj: R knee on 05/30/2017 10:12 AM Indications: pain Details: 22 G needle, anterolateral approach Medications: 2 mL lidocaine 1 %; 2 mL bupivacaine 0.25 %; 40 mg methylPREDNISolone acetate 40 MG/ML      Clinical Data: No additional findings.   Subjective: Chief Complaint  Patient presents with  . Right Knee - Pain    HPI Rita Bass comes in with right knee pain.  This is been ongoing for the past 2 weeks without any known injury or change in activity.  Pain she has is to the entire right knee.  She notes that this is progressively worsened over the past few days when she stepped in a hole.  Her pain is described as constant.  No mechanical symptoms although she does admit to some instability.  Nothing seems to make it worse or better.  She is tried Advil and Tylenol without relief of symptoms.  No numbness tingling burning.  Of note, we saw her approximately 1 year  ago for her left knee.  Marked degenerative changes throughout.  We tried to refer her to a bariatric clinic however her insurance would not pay for this.  We also referred her to a pain clinic which her insurance also declined coverage for.  Review of Systems as detailed in HPI.  All others reviewed and are negative.   Objective: Vital Signs: There were no vitals taken for this visit.  Physical Exam well-developed well-nourished female no acute distress.  Alert and oriented x3.  Morbidly obese.  Ortho Exam examination of the right knee reveals range of motion from 5-90 degrees.  She does have minimal patellofemoral crepitus.  She is stable valgus varus stress.  Impression is right knee  Specialty Comments:  No specialty comments available.  Imaging: Xr Knee 3 View Right  Result Date: 05/30/2017 Marked degenerative changes patellofemoral compartment.    PMFS History: Patient Active Problem List   Diagnosis Date Noted  . Chronic pain of right knee 07/13/2016  . Acute bronchitis 02/04/2015  . Morbid obesity (HCC) 02/04/2015  . Sinus tachycardia 02/04/2015  . Menorrhagia with regular cycle 11/18/2014   Past Medical History:  Diagnosis Date  .  Arthritis   . H/O bronchitis   . Kidney stones   . Obesity   . Renal disorder   . Shortness of breath dyspnea     Family History  Problem Relation Age of Onset  . Breast cancer Mother   . Other Father        committed suicide    Past Surgical History:  Procedure Laterality Date  . CESAREAN SECTION     6 c-sections  . CHOLECYSTECTOMY    . HERNIA REPAIR    . KIDNEY STONE SURGERY    . OOPHORECTOMY    . TUBAL LIGATION     Social History   Occupational History  . Not on file  Tobacco Use  . Smoking status: Never Smoker  . Smokeless tobacco: Never Used  Substance and Sexual Activity  . Alcohol use: No  . Drug use: No  . Sexual activity: Not Currently

## 2017-05-30 NOTE — Telephone Encounter (Signed)
Pt called back checking on brace

## 2017-05-30 NOTE — Telephone Encounter (Signed)
See message below °

## 2017-05-30 NOTE — Telephone Encounter (Signed)
Patient was given a cortisone injection in her knee this morning but is asking for a right knee brace to keep it from "shifting". She had previously been given one for her left knee and it helped her a lot. Please let patient know when she could pick up # [phone]

## 2017-05-30 NOTE — Telephone Encounter (Signed)
Can we do this ?

## 2017-05-30 NOTE — Telephone Encounter (Signed)
Patient will come by to get fitted for a brace. Advised her we do close at 5 pm.

## 2017-08-01 ENCOUNTER — Emergency Department (HOSPITAL_COMMUNITY): Payer: Medicaid Other

## 2017-08-01 ENCOUNTER — Encounter (HOSPITAL_COMMUNITY): Payer: Self-pay | Admitting: Emergency Medicine

## 2017-08-01 ENCOUNTER — Emergency Department (HOSPITAL_COMMUNITY)
Admission: EM | Admit: 2017-08-01 | Discharge: 2017-08-01 | Disposition: A | Discharge status: Home / Self Care | Payer: Medicaid Other | Attending: Emergency Medicine | Admitting: Emergency Medicine

## 2017-08-01 ENCOUNTER — Other Ambulatory Visit: Payer: Self-pay

## 2017-08-01 DIAGNOSIS — R0602 Shortness of breath: Secondary | ICD-10-CM | POA: Diagnosis not present

## 2017-08-01 DIAGNOSIS — J069 Acute upper respiratory infection, unspecified: Secondary | ICD-10-CM | POA: Diagnosis not present

## 2017-08-01 DIAGNOSIS — B9789 Other viral agents as the cause of diseases classified elsewhere: Secondary | ICD-10-CM

## 2017-08-01 DIAGNOSIS — R9431 Abnormal electrocardiogram [ECG] [EKG]: Secondary | ICD-10-CM | POA: Diagnosis not present

## 2017-08-01 DIAGNOSIS — R05 Cough: Secondary | ICD-10-CM | POA: Diagnosis present

## 2017-08-01 LAB — CBC WITH DIFFERENTIAL/PLATELET
Abs Immature Granulocytes: 0 10*3/uL (ref 0.0–0.1)
Basophils Absolute: 0 10*3/uL (ref 0.0–0.1)
Basophils Relative: 0 %
EOS ABS: 0.5 10*3/uL (ref 0.0–0.7)
Eosinophils Relative: 5 %
HEMATOCRIT: 41.3 % (ref 36.0–46.0)
Hemoglobin: 12.1 g/dL (ref 12.0–15.0)
IMMATURE GRANULOCYTES: 1 %
LYMPHS ABS: 2.7 10*3/uL (ref 0.7–4.0)
Lymphocytes Relative: 30 %
MCH: 20.8 pg — ABNORMAL LOW (ref 26.0–34.0)
MCHC: 29.3 g/dL — ABNORMAL LOW (ref 30.0–36.0)
MCV: 71.1 fL — AB (ref 78.0–100.0)
MONOS PCT: 5 %
Monocytes Absolute: 0.5 10*3/uL (ref 0.1–1.0)
NEUTROS PCT: 59 %
Neutro Abs: 5.1 10*3/uL (ref 1.7–7.7)
Platelets: 366 10*3/uL (ref 150–400)
RBC: 5.81 MIL/uL — ABNORMAL HIGH (ref 3.87–5.11)
RDW: 18.6 % — AB (ref 11.5–15.5)
WBC: 8.8 10*3/uL (ref 4.0–10.5)

## 2017-08-01 LAB — COMPREHENSIVE METABOLIC PANEL
ALBUMIN: 3.5 g/dL (ref 3.5–5.0)
ALT: 23 U/L (ref 14–54)
AST: 23 U/L (ref 15–41)
Alkaline Phosphatase: 84 U/L (ref 38–126)
Anion gap: 13 (ref 5–15)
BUN: 5 mg/dL — AB (ref 6–20)
CHLORIDE: 98 mmol/L — AB (ref 101–111)
CO2: 26 mmol/L (ref 22–32)
CREATININE: 0.63 mg/dL (ref 0.44–1.00)
Calcium: 8.7 mg/dL — ABNORMAL LOW (ref 8.9–10.3)
GFR calc non Af Amer: >60 mL/min (ref >60)
GLUCOSE: 246 mg/dL — AB (ref 65–99)
Potassium: 3.8 mmol/L (ref 3.5–5.1)
SODIUM: 137 mmol/L (ref 135–145)
Total Bilirubin: 0.4 mg/dL (ref 0.3–1.2)
Total Protein: 7.4 g/dL (ref 6.5–8.1)

## 2017-08-01 LAB — I-STAT BETA HCG BLOOD, ED (MC, WL, AP ONLY): I-stat hCG, quantitative: <5 m[IU]/mL (ref <5)

## 2017-08-01 MED ORDER — ALBUTEROL SULFATE HFA 108 (90 BASE) MCG/ACT IN AERS: 1.0000 | INHALATION_SPRAY | Freq: Four times a day (QID) | RESPIRATORY_TRACT | 1 refills | Status: DC | PRN

## 2017-08-01 MED ORDER — IPRATROPIUM-ALBUTEROL 0.5-2.5 (3) MG/3ML IN SOLN
3.0000 mL | Freq: Once | RESPIRATORY_TRACT | Status: AC
  Administered 2017-08-01: 3 mL via RESPIRATORY_TRACT
  Filled 2017-08-01: qty 3

## 2017-08-01 MED ORDER — ALBUTEROL SULFATE (2.5 MG/3ML) 0.083% IN NEBU
INHALATION_SOLUTION | RESPIRATORY_TRACT | Status: AC
  Filled 2017-08-01: qty 6

## 2017-08-01 MED ORDER — METFORMIN HCL 500 MG PO TABS: 500.0000 mg | ORAL_TABLET | Freq: Two times a day (BID) | ORAL | 0 refills | Status: DC

## 2017-08-01 MED ORDER — ALBUTEROL SULFATE (2.5 MG/3ML) 0.083% IN NEBU
5.0000 mg | INHALATION_SOLUTION | Freq: Once | RESPIRATORY_TRACT | Status: AC
  Administered 2017-08-01: 5 mg via RESPIRATORY_TRACT

## 2017-08-01 MED ORDER — BENZONATATE 100 MG PO CAPS: 100.0000 mg | ORAL_CAPSULE | Freq: Three times a day (TID) | ORAL | 0 refills | Status: DC

## 2017-08-01 NOTE — ED Triage Notes (Signed)
Pt to ER for evaluation of shortness of breath and nonproductive cough x3 days. Expiratory wheezing noted in lower lung fields. Pt in NAD.

## 2017-08-01 NOTE — ED Provider Notes (Signed)
[facility] EMERGENCY DEPARTMENT Provider Note   CSN: [id number] Arrival date & time: 08/01/17  1036     History   Chief Complaint Chief Complaint  Patient presents with  . Shortness of Breath  . Cough    HPI Rita Bass is a 52 y.o. female.  HPI   52 year old female presents today with complaints of cough and shortness of breath.  Patient notes over the last 3 days she has had rhinorrhea nasal congestion and cough.  She denies any associated chest pain.  She reports that she has had symptoms like this previously with upper respiratory infections.  She notes that she feels like she has asthma but has never been diagnosed.  She reports that she is not a smoker.  She denies any fever chills nausea or vomiting.  Past Medical History:  Diagnosis Date  . Arthritis   . H/O bronchitis   . Kidney stones   . Obesity   . Renal disorder   . Shortness of breath dyspnea     Patient Active Problem List   Diagnosis Date Noted  . Chronic pain of right knee 07/13/2016  . Acute bronchitis 02/04/2015  . Morbid obesity (HCC) 02/04/2015  . Sinus tachycardia 02/04/2015  . Menorrhagia with regular cycle 11/18/2014    Past Surgical History:  Procedure Laterality Date  . CESAREAN SECTION     6 c-sections  . CHOLECYSTECTOMY    . HERNIA REPAIR    . KIDNEY STONE SURGERY    . OOPHORECTOMY    . TUBAL LIGATION       OB History    Gravida  6   Para  6   Term  5   Preterm  1   AB  0   Living  7     SAB  0   TAB  0   Ectopic  0   Multiple  2   Live Births               Home Medications    Prior to Admission medications   Medication Sig Start Date End Date Taking? Authorizing Provider  albuterol (PROVENTIL HFA;VENTOLIN HFA) 108 (90 Base) MCG/ACT inhaler Inhale 1-2 puffs into the lungs every 6 (six) hours as needed for wheezing or shortness of breath. 08/01/17   Allena Pietila, Tinnie Gens, PA-C  benzonatate (TESSALON) 100 MG capsule Take 1 capsule (100  mg total) by mouth every 8 (eight) hours. 08/01/17   Ramar Nobrega, Tinnie Gens, PA-C  ibuprofen (ADVIL,MOTRIN) 200 MG tablet Take 400 mg by mouth every 6 (six) hours as needed for moderate pain.    [provider]  metFORMIN (GLUCOPHAGE) 500 MG tablet Take 1 tablet (500 mg total) by mouth 2 (two) times daily with a meal. 08/01/17   Emelin Dascenzo, Tinnie Gens, PA-C  promethazine-dextromethorphan (PROMETHAZINE-DM) 6.25-15 MG/5ML syrup Take 5 mLs by mouth 4 (four) times daily as needed for cough. 04/13/17   Georgiana Shore, PA-C  traMADol (ULTRAM) 50 MG tablet Take 2 tablets (100 mg total) by mouth every 6 (six) hours as needed. 05/30/17   Cristie Hem, PA-C    Family History Family History  Problem Relation Age of Onset  . Breast cancer Mother   . Other Father        committed suicide    Social History Social History   Tobacco Use  . Smoking status: Never Smoker  . Smokeless tobacco: Never Used  Substance Use Topics  . Alcohol use: No  . Drug use: No  Allergies   Patient has no known allergies.   Review of Systems Review of Systems  All other systems reviewed and are negative.    Physical Exam Updated Vital Signs BP (!) 149/99 (BP Location: Right Arm)   Pulse 99   Temp 98.7 F (37.1 C) (Oral)   Resp 20   SpO2 97%   Physical Exam  Constitutional: She is oriented to person, place, and time. She appears well-developed and well-nourished.  HENT:  Head: Normocephalic and atraumatic.  Rhinorrhea  Eyes: Pupils are equal, round, and reactive to light. Conjunctivae are normal. Right eye exhibits no discharge. Left eye exhibits no discharge. No scleral icterus.  Neck: Normal range of motion. No JVD present. No tracheal deviation present.  Pulmonary/Chest: Effort normal. No stridor.  Minor expiratory wheeze bilateral lobes-no crackles no respiratory distress  Neurological: She is alert and oriented to person, place, and time. Coordination normal.  Psychiatric: She has a normal  mood and affect. Her behavior is normal. Judgment and thought content normal.  Nursing note and vitals reviewed.    ED Treatments / Results  Labs (all labs ordered are listed, but only abnormal results are displayed) Labs Reviewed  COMPREHENSIVE METABOLIC PANEL - Abnormal; Notable for the following components:      Result Value   Chloride 98 (*)    Glucose, Bld 246 (*)    BUN 5 (*)    Calcium 8.7 (*)    All other components within normal limits  CBC WITH DIFFERENTIAL/PLATELET - Abnormal; Notable for the following components:   RBC 5.81 (*)    MCV 71.1 (*)    MCH 20.8 (*)    MCHC 29.3 (*)    RDW 18.6 (*)    All other components within normal limits  I-STAT BETA HCG BLOOD, ED ([facility], [facility], [facility] ONLY)    EKG None  Radiology Dg Chest 2 View  Result Date: 08/01/2017 CLINICAL DATA:  Shortness of breath and dry cough for 3 days. EXAM: CHEST - 2 VIEW COMPARISON:  CT chest 02/03/2015. PA and lateral chest 02/03/2015 and 04/13/2017. FINDINGS: The lungs are clear. Heart size is normal. No pneumothorax or pleural effusion. No acute bony abnormality. IMPRESSION: No acute disease. Electronically Signed   By: Drusilla Kanner M.D.   On: 08/01/2017 12:07    Procedures Procedures (including critical care time)  Medications Ordered in ED Medications  albuterol (PROVENTIL) (2.5 MG/3ML) 0.083% nebulizer solution 5 mg (5 mg Nebulization Given 08/01/17 1052)  ipratropium-albuterol (DUONEB) 0.5-2.5 (3) MG/3ML nebulizer solution 3 mL (3 mLs Nebulization Given 08/01/17 1355)     Initial Impression / Assessment and Plan / ED Course  I have reviewed the triage vital signs and the nursing notes.  Pertinent labs & imaging results that were available during my care of the patient were reviewed by me and considered in my medical decision making (see chart for details).     52 year old female presents today with likely viral URI.  She is afebrile clear chest x-ray with minimal wheeze.  Her symptoms  improved with second breathing treatment, she will be discharged home with albuterol, she is encouraged to follow-up as an outpatient with primary care when she is given strict return precautions.  She verbalized understanding and agreement to today's plan had no further questions or concerns the time of discharge.  Final Clinical Impressions(s) / ED Diagnoses   Final diagnoses:  Viral URI with cough    ED Discharge Orders        Ordered  albuterol (PROVENTIL HFA;VENTOLIN HFA) 108 (90 Base) MCG/ACT inhaler  Every 6 hours PRN     08/01/17 1505    benzonatate (TESSALON) 100 MG capsule  Every 8 hours     08/01/17 1505    metFORMIN (GLUCOPHAGE) 500 MG tablet  2 times daily with meals     08/01/17 1505       Rosalio Loud 08/01/17 Adah Salvage, MD 08/02/17 1728

## 2017-08-01 NOTE — Discharge Instructions (Addendum)
Please read attached information. If you experience any new or worsening signs or symptoms please return to the emergency room for evaluation. Please follow-up with your primary care provider or specialist as discussed. Please use medication prescribed only as directed and discontinue taking if you have any concerning signs or symptoms.   °

## 2017-08-16 ENCOUNTER — Ambulatory Visit: Payer: Medicaid Other | Admitting: Family Medicine

## 2017-08-16 ENCOUNTER — Other Ambulatory Visit: Payer: Self-pay

## 2017-08-16 ENCOUNTER — Encounter: Payer: Self-pay | Admitting: Family Medicine

## 2017-08-16 VITALS — BP 126/82 | HR 100 | Temp 97.7°F | Ht 64.0 in | Wt 350.0 lb

## 2017-08-16 DIAGNOSIS — R0600 Dyspnea, unspecified: Secondary | ICD-10-CM

## 2017-08-16 DIAGNOSIS — Z9189 Other specified personal risk factors, not elsewhere classified: Secondary | ICD-10-CM | POA: Diagnosis not present

## 2017-08-16 DIAGNOSIS — Z7689 Persons encountering health services in other specified circumstances: Secondary | ICD-10-CM | POA: Diagnosis not present

## 2017-08-16 DIAGNOSIS — R739 Hyperglycemia, unspecified: Secondary | ICD-10-CM

## 2017-08-16 DIAGNOSIS — K219 Gastro-esophageal reflux disease without esophagitis: Secondary | ICD-10-CM | POA: Diagnosis not present

## 2017-08-16 DIAGNOSIS — Z114 Encounter for screening for human immunodeficiency virus [HIV]: Secondary | ICD-10-CM | POA: Diagnosis not present

## 2017-08-16 DIAGNOSIS — G4730 Sleep apnea, unspecified: Secondary | ICD-10-CM | POA: Diagnosis not present

## 2017-08-16 DIAGNOSIS — R0609 Other forms of dyspnea: Secondary | ICD-10-CM

## 2017-08-16 LAB — POCT GLYCOSYLATED HEMOGLOBIN (HGB A1C): HEMOGLOBIN A1C: 9.1 % — AB (ref 4.0–5.6)

## 2017-08-16 MED ORDER — OMEPRAZOLE 40 MG PO CPDR: 40.0000 mg | DELAYED_RELEASE_CAPSULE | Freq: Every day | ORAL | 3 refills | Status: DC

## 2017-08-16 MED ORDER — ALBUTEROL SULFATE HFA 108 (90 BASE) MCG/ACT IN AERS: 1.0000 | INHALATION_SPRAY | Freq: Four times a day (QID) | RESPIRATORY_TRACT | 1 refills | Status: DC | PRN

## 2017-08-16 MED ORDER — METFORMIN HCL 500 MG PO TABS: 500.0000 mg | ORAL_TABLET | Freq: Two times a day (BID) | ORAL | 0 refills | Status: DC

## 2017-08-16 NOTE — Assessment & Plan Note (Signed)
Patient endorses chronic dyspnea.  Also associated with cough and intermittent chest pain.  Patient also has had some leg swelling.  Given the history unlikely to be related to occult PE.  Given her endorsed history of asthma, likely 2.  Patient has large body habitus with out consistent care, may have obesity hypoventilation and also at risk for cardiac agent may be a component CHF is not likely given patient's work-up in the ED show any signs of LVH, chest x-ray did not show any cardiomegaly or pulmonary edema, patient is not with exam.  Will obtain BNP to assess heart failure.  Will obtain PFTs from the clinic, will to pulmonology for further

## 2017-08-16 NOTE — Assessment & Plan Note (Signed)
Patient also may be the cause of patient's cough.  Will trial omeprazole follow-up

## 2017-08-16 NOTE — Assessment & Plan Note (Addendum)
Patient has, documented hyperglycemia likely associated with diabetes.  Will obtain basic screening labs lipid, TSH, A1c to further risk stratify

## 2017-08-16 NOTE — Patient Instructions (Addendum)
It was a pleasure to see you today! Thank you for choosing [facility] for your primary care. Rita HockingDebbie Bass was seen to establish care. We are getting routine screening labs. We are also referring you to get a sleep study. Stop by the front office to schedule a visit for your lung test.   Orders Placed This Encounter  Procedures  . Lipid Profile  . B Nat Peptide  . TSH  . HIV antibody (with reflex)  . Ambulatory referral to Pulmonology  . POCT HgB A1C   Meds ordered this encounter  Medications  . DISCONTD: omeprazole (PRILOSEC) 40 MG capsule    Sig: Take 1 capsule (40 mg total) by mouth daily.    Dispense:  30 capsule    Refill:  3  . metFORMIN (GLUCOPHAGE) 500 MG tablet    Sig: Take 1 tablet (500 mg total) by mouth 2 (two) times daily with a meal.    Dispense:  60 tablet    Refill:  0  . omeprazole (PRILOSEC) 40 MG capsule    Sig: Take 1 capsule (40 mg total) by mouth daily.    Dispense:  30 capsule    Refill:  3  . albuterol (PROVENTIL HFA;VENTOLIN HFA) 108 (90 Base) MCG/ACT inhaler    Sig: Inhale 1-2 puffs into the lungs every 6 (six) hours as needed for wheezing or shortness of breath.    Dispense:  1 Inhaler    Refill:  1     Best,  Thomes DinningBrad Adhira Jamil, MD, MS FAMILY MEDICINE RESIDENT - PGY1 08/16/2017 3:31 PM

## 2017-08-16 NOTE — Progress Notes (Signed)
Subjective:  Rita Bass is a 52 y.o. female who presents to the [facility] today to establish care with new PCP.  HPI: Chronic dyspnea with cough  History of asthma Patient is complaining of chronic dyspnea.  She has been told she has asthma.  Is getting worse.  She did have recent work-up in the ED which showed normal chest x-ray,, work-up negative for cardiac pulmonary normalities.  Does get better with albuterol inhalers.  Which she uses intermittently every other day.  She does not have history of asthma.  Denies any chest pain currently, but has had with her previous episodes of shortness of breath.  EKG was negative for ST changes.  Does endorse swelling, worsening says that she has a chronic cough that she has tried multiple agents for [employer], over-the-counter cough without much improvement.  She does not have any infectious symptoms including fevers or chills, productive sputum or rhinosinusitis.  She does not cough up anything with these coughs.    Sleep apnea She endorses having complications waking up from anesthesia/intubation from prior surgeries.  She has been told that she has sleep apnea in the past.  She has not had formal sleep study.  No orthopnea, uses 1-2 pillows.  Wake up gasping for air.  SHe does snore rather loudly.  GERD Patient endorses having frequent heartburn.  Having to take Tums "like".  She has not tried anything else.  Describes epigastric pain after large meals  Left knee pain Patient endorses chronic left knee pain, she is seen by an orthopedist.  He is planning on getting replacement.  Pain is well controlled with tramadol prescribed by her.  History of hyperglycemia Patient has multiple serum glucoses 300s.  She has been started on metformin by ED provider.  She has not had A1c.  Denies any history of diabetes. Objective:  Physical Exam: BP 126/82   Pulse 100   Temp 97.7 F (36.5 C) (Oral)   Ht  (1.626 m)   Wt (!) 350 lb (158.8 kg)    SpO2 96%   BMI 60.08 kg/m   Gen: NAD, resting comfortably, morbid obesity CV: RRR with no murmurs appreciated Pulm: NWOB, CTAB with no crackles, wheezes, or rhonchi GI: Normal bowel sounds present. Soft, Nontender, Nondistended. MSK: no edema, cyanosis, or clubbing noted Skin: warm, dry Neuro: grossly normal, moves all extremities Psych: Normal affect and thought content  Results for orders placed or performed in visit on 08/16/17 (from the past 72 hour(s))  POCT HgB A1C     Status: Abnormal   Collection Time: 08/16/17  3:35 PM  Result Value Ref Range   Hemoglobin A1C 9.1 (A) 4.0 - 5.6 %   HbA1c, POC (prediabetic range)  5.7 - 6.4 %   HbA1c, POC (controlled diabetic range)  0.0 - 7.0 %     Assessment/Plan:  Dyspnea Patient endorses chronic dyspnea.  Also associated with cough and intermittent chest pain.  Patient also has had some leg swelling.  Given the history unlikely to be related to occult PE.  Given her endorsed history of asthma, likely 2.  Patient has large body habitus with out consistent care, may have obesity hypoventilation and also at risk for cardiac agent may be a component CHF is not likely given patient's work-up in the ED show any signs of LVH, chest x-ray did not show any cardiomegaly or pulmonary edema, patient is not with exam.  Will obtain BNP to assess heart failure.  Will obtain PFTs  from the clinic, will to pulmonology for further  At risk for acute ischemic cardiac event Patient has, documented hyperglycemia likely associated with diabetes.  Will obtain basic screening labs lipid, TSH, A1c to further risk stratify  Gastroesophageal reflux disease Patient also may be the cause of patient's cough.  Will trial omeprazole follow-up  Sleep apnea Will refer to pulmonology for sleep apnea  Have pt return in 2 weeks for further work up   Lab Orders     Lipid Profile     B Nat Peptide     TSH     HIV antibody (with reflex)     POCT HgB A1C  Meds ordered  this encounter  Medications  . DISCONTD: omeprazole (PRILOSEC) 40 MG capsule    Sig: Take 1 capsule (40 mg total) by mouth daily.    Dispense:  30 capsule    Refill:  3  . metFORMIN (GLUCOPHAGE) 500 MG tablet    Sig: Take 1 tablet (500 mg total) by mouth 2 (two) times daily with a meal.    Dispense:  60 tablet    Refill:  0  . omeprazole (PRILOSEC) 40 MG capsule    Sig: Take 1 capsule (40 mg total) by mouth daily.    Dispense:  30 capsule    Refill:  3  . albuterol (PROVENTIL HFA;VENTOLIN HFA) 108 (90 Base) MCG/ACT inhaler    Sig: Inhale 1-2 puffs into the lungs every 6 (six) hours as needed for wheezing or shortness of breath.    Dispense:  1 Inhaler    Refill:  1       Thomes Dinning, MD, MS FAMILY MEDICINE RESIDENT - PGY1 08/16/2017 5:05 PM

## 2017-08-16 NOTE — Assessment & Plan Note (Signed)
Will refer to pulmonology for sleep apnea

## 2017-08-17 ENCOUNTER — Telehealth: Payer: Self-pay | Admitting: Family Medicine

## 2017-08-17 ENCOUNTER — Encounter: Payer: Self-pay | Admitting: Family Medicine

## 2017-08-17 DIAGNOSIS — R0600 Dyspnea, unspecified: Secondary | ICD-10-CM

## 2017-08-17 LAB — LIPID PANEL
CHOL/HDL RATIO: 3.5 ratio (ref 0.0–4.4)
Cholesterol, Total: 171 mg/dL (ref 100–199)
HDL: 49 mg/dL (ref >39)
LDL CALC: 90 mg/dL (ref 0–99)
Triglycerides: 158 mg/dL — ABNORMAL HIGH (ref 0–149)
VLDL CHOLESTEROL CAL: 32 mg/dL (ref 5–40)

## 2017-08-17 LAB — TSH: TSH: 1.32 u[IU]/mL (ref 0.450–4.500)

## 2017-08-17 LAB — HIV ANTIBODY (ROUTINE TESTING W REFLEX): HIV Screen 4th Generation wRfx: NONREACTIVE

## 2017-08-17 LAB — BRAIN NATRIURETIC PEPTIDE: BNP: 16.4 pg/mL (ref 0.0–100.0)

## 2017-08-17 NOTE — Progress Notes (Signed)
Please send letter to patient with lab results.

## 2017-08-17 NOTE — Telephone Encounter (Signed)
Called patient to discuss need for cardiology referral to rule out and cardiac abnormalities. Patient acknowledged this. Will place referral.

## 2017-08-17 NOTE — Telephone Encounter (Signed)
-----   Message from Latrelle DodrillBrittany J McIntyre, MD sent at 08/16/2017  8:16 PM EDT ----- Dorann LodgeHey Brad, Reviewed your note for this patient. With her risk factors, chest pain, and dyspnea would recommend she be seen by cardiology. We can discuss in person if you'd like. Latrelle DodrillBrittany J McIntyre, MD

## 2017-08-22 ENCOUNTER — Ambulatory Visit: Payer: Medicaid Other | Admitting: Pharmacist

## 2017-08-22 ENCOUNTER — Encounter: Payer: Self-pay | Admitting: Pharmacist

## 2017-08-22 DIAGNOSIS — R0609 Other forms of dyspnea: Secondary | ICD-10-CM | POA: Diagnosis not present

## 2017-08-22 MED ORDER — ACCU-CHEK AVIVA PLUS W/DEVICE KIT: 1.0000 | PACK | Freq: Once | 0 refills | Status: AC

## 2017-08-22 MED ORDER — ACCU-CHEK SOFTCLIX LANCETS MISC: 12 refills | Status: DC

## 2017-08-22 MED ORDER — ACCU-CHEK SOFTCLIX LANCET DEV KIT: 1.0000 | PACK | Freq: Once | 0 refills | Status: AC

## 2017-08-22 MED ORDER — FLUTICASONE-UMECLIDIN-VILANT 100-62.5-25 MCG/INH IN AEPB: 1.0000 | INHALATION_SPRAY | Freq: Every day | RESPIRATORY_TRACT | 0 refills | Status: DC

## 2017-08-22 MED ORDER — GLUCOSE BLOOD VI STRP: ORAL_STRIP | 12 refills | Status: DC
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
## 2017-08-22 NOTE — Patient Instructions (Signed)
Great to meet you today.   Lung function Test revealed reduced lung function.   Please Take one inhalation of Trelegy Inhaler once daily.   Return to Pharmacy Clinic in 4 weeks.

## 2017-08-22 NOTE — Assessment & Plan Note (Signed)
Patient has been experiencing shortness of breath for 1 year and taking albuterol PRN. Never a smoker but with second hand smoke exposure and potential exposure to environmental allergens with housing situation. Spirometry evaluation reveals possible restrictive lung disease. Post nebulized albuterol tx revealed similar results as pre-test. Data hard to interpret due to variable efforts on spirometry. -Begin Trelegy inhaler, one puff daily. Samples provided.  -Educated patient on purpose, proper use, potential adverse effects including risk of esophageal candidiasis and need to rinse mouth after each use.  -Reviewed results of pulmonary function tests.

## 2017-08-23 NOTE — Progress Notes (Signed)
Patient ID: Pervis HockingDebbie Bass, female   DOB: 1965/11/12, 52 y.o.   MRN: [id number] Reviewed: Agree with Dr. Macky LowerKoval's documentation and management.

## 2017-09-09 ENCOUNTER — Ambulatory Visit: Payer: Medicaid Other | Admitting: Pharmacist

## 2017-09-09 ENCOUNTER — Encounter: Payer: Self-pay | Admitting: Pharmacist

## 2017-09-09 DIAGNOSIS — R0609 Other forms of dyspnea: Secondary | ICD-10-CM | POA: Diagnosis not present

## 2017-09-09 DIAGNOSIS — R739 Hyperglycemia, unspecified: Secondary | ICD-10-CM | POA: Diagnosis present

## 2017-09-09 MED ORDER — METFORMIN HCL 500 MG PO TABS: 1000.0000 mg | ORAL_TABLET | Freq: Two times a day (BID) | ORAL | Status: DC

## 2017-09-09 NOTE — Progress Notes (Signed)
    S:    Patient arrives today with her two grandchildren. Presents for lung function and diabetes follow-up. Patient was referred by Dr. Janee Mornhompson (referred on 08/16/2017).  Patient was last seen by Primary Care Provider on 08/16/2017. Patient reports breathing has been uncontrolled on current inhalers and has been worried to fall asleep at night because she loses her breath and grandchildren are worried about her breathing. Patient also brought in blood sugar log with most home blood glucose numbers in the low 200s.  Insurance coverage: Medicaid  Patient reports adherence to medications, but does not feel that medications are improving her shortness of breath. Patient reports last dose of COPD medications was today in the morning. Current COPD medications: albuterol inhaler, Trelegy Rescue inhaler use frequency: three times daily, every day  Current diabetes medications: metformin 500mg  BID  O: Physical Exam  Constitutional: She appears well-developed and well-nourished.  Vitals reviewed.   Review of Systems  Respiratory: Positive for shortness of breath. Negative for wheezing.   Cardiovascular: Positive for orthopnea.       Uses 4 pillows - last 12 months (since 2018).   All other systems reviewed and are negative. Patient's O2 sats were mid-90s while walking around the building.  Last A1c 9.1 on 08/16/2017.  A/P: Patient has been experiencing SOB for about a year and started taking albuterol and Trelegy samples on 08/16/2017. Patient still experiencing SOB without resolution with both of the inhalers. -Continue rescue albuterol inhaler -Skip 1-2 doses of Trelegy this week and f/u with Dr. Janee Mornhompson on 09/13/2017, consider restarting Trelegy if breathing decompensates prior to visit later this week.   Diabetes uncontrolled on monotherapy metformin. Control suboptimal due to insulin resistance, diet, resistance to injections and obesity. -Increase metformin slowly to 1000mg  in the  morning and 500mg  at night, then start 1000mg  BID on Wednesday   F/U Clinic visit on Friday, 09/13/2017, with Dr. Janee Mornhompson. Total time in face to face counseling 35 minutes.  Patient seen with Donnella Biyler Colvard, PharmD, PGY1 Pharmacy  .

## 2017-09-09 NOTE — Assessment & Plan Note (Signed)
Patient has been experiencing SOB for about a year and started taking albuterol and Trelegy samples on 08/16/2017. Patient still experiencing SOB without resolution with both of the inhalers. -Continue rescue albuterol inhaler -Skip 1-2 doses of Trelegy this week and f/u with Dr. Janee Mornhompson on 09/13/2017, consider restarting Trelegy if breathing decompensates prior to visit later this week.

## 2017-09-09 NOTE — Patient Instructions (Addendum)
Nice to see you and your family today.  Continue albuterol inhaler.  Skip 1-2 doses of Trelegy this week.  Increase metformin 500mg  (1 tablet) to 2 tablets with largest meal, and 1 tablet with smaller meal, then increase to 2 tablets twice a day on Wednesday.  F/u with Dr. Janee Mornhompson on Friday, 09/13/2017.

## 2017-09-09 NOTE — Assessment & Plan Note (Signed)
Diabetes uncontrolled on monotherapy metformin. Control suboptimal due to insulin resistance, diet, resistance to injections and obesity. -Increase metformin slowly to 1000mg  in the morning and 500mg  at night, then start 1000mg  BID on Wednesday

## 2017-09-09 NOTE — Progress Notes (Signed)
Patient ID: Rita HockingDebbie Sebree, female   DOB: June 26, 1965, 52 y.o.   MRN: [id number] Reviewed: Agree with Dr. Macky LowerKoval's documentation and management.

## 2017-09-13 ENCOUNTER — Telehealth: Payer: Self-pay | Admitting: Family Medicine

## 2017-09-13 ENCOUNTER — Ambulatory Visit: Payer: Medicaid Other | Admitting: Family Medicine

## 2017-09-13 ENCOUNTER — Other Ambulatory Visit: Payer: Self-pay

## 2017-09-13 ENCOUNTER — Encounter: Payer: Self-pay | Admitting: Family Medicine

## 2017-09-13 VITALS — BP 134/80 | HR 91 | Temp 97.6°F | Ht 64.0 in | Wt 348.0 lb

## 2017-09-13 DIAGNOSIS — J984 Other disorders of lung: Secondary | ICD-10-CM | POA: Diagnosis not present

## 2017-09-13 DIAGNOSIS — R0609 Other forms of dyspnea: Secondary | ICD-10-CM

## 2017-09-13 DIAGNOSIS — E118 Type 2 diabetes mellitus with unspecified complications: Secondary | ICD-10-CM

## 2017-09-13 MED ORDER — MOMETASONE FURO-FORMOTEROL FUM 100-5 MCG/ACT IN AERO: 2.0000 | INHALATION_SPRAY | Freq: Every day | RESPIRATORY_TRACT | 12 refills | Status: DC

## 2017-09-13 MED ORDER — EMPAGLIFLOZIN 10 MG PO TABS: 10.0000 mg | ORAL_TABLET | Freq: Every day | ORAL | 12 refills | Status: DC

## 2017-09-13 MED ORDER — METFORMIN HCL 500 MG PO TABS: 1000.0000 mg | ORAL_TABLET | Freq: Two times a day (BID) | ORAL | 3 refills | Status: DC

## 2017-09-13 MED ORDER — TIOTROPIUM BROMIDE MONOHYDRATE 18 MCG IN CAPS: 18.0000 ug | ORAL_CAPSULE | Freq: Every day | RESPIRATORY_TRACT | 12 refills | Status: DC

## 2017-09-13 MED ORDER — METFORMIN HCL 500 MG PO TABS: 1000.0000 mg | ORAL_TABLET | Freq: Two times a day (BID) | ORAL | Status: DC

## 2017-09-13 NOTE — Patient Instructions (Signed)
It was a pleasure to see you today! Thank you for choosing [facility] for your primary care. Rita HockingDebbie Bass was seen for shortness of breath and diabetes.   Shortness of Breath We are going to get an echocardiogram of your heart. [facility] as soon as possible. We are starting you on two new inhalers (Spiriva and Dulera) for your shortness of breath. These are similar to the Trelegy, but are covered by Medicaid.   Diabetes I have re-written you a prescription for your metformin, I have also started you on another pill for your diabetes, Jardiance. Continue to take your blood sugar daily.  Come back to clinic in 1 month to follow up on your shortness of breath, your heart, and your diabetes.    Best,  Thomes DinningBrad Thompson, MD, MS FAMILY MEDICINE RESIDENT - PGY1 09/13/2017 3:49 PM

## 2017-09-13 NOTE — Assessment & Plan Note (Signed)
Pt CBGs still uncontrolled. Tolerating increase in metformin. Will also trial jardiance. Pt to come back in 1 mo to cont to f/u on DM w/ CBG log.

## 2017-09-13 NOTE — Progress Notes (Signed)
Subjective:  Rita HockingDebbie Bass is a 52 y.o. female who [facility] with a chief complaint of shortness of breath.   HPI:  Shortness of Breath Patient shortness of breath.  Improved with trelegy, but worsened when stopped as directed by Dr. Raymondo BandKoval. Pt has to use albuterol rescue inhaler 4x aday to improve SOB. Pt SOB is exhersional only. SOB is not worsening. She does endorse orthopnea, requiring 4 pillows at night. Pt denies weight gain, or worsening lower extremity swelling. Pt has chronic cough that is worse at night. It is nonproductive She is still concerned to go to sleep b/c she says that she stops breathing at night. Denies any abdominal distension, CP, abdominal pain, palpitations.   Diabetes Pt has been tracking fasting CBGs. Ranging from 180-300 daily. Most in the low 200s. She is tolerating 1000 mg metformin well. Denies any signs/symptoms of hypoglycemia including dizziness, LOC, fatigue. Denies any s/s of hyperglycemia including polyurea, polydypsia.   ROS: Per HPI  PMH: Smoking history reviewed.    Objective:  Physical Exam: BP 134/80   Pulse 91   Temp 97.6 F (36.4 C) (Oral)   Ht 5\' 4"  (1.626 m)   Wt (!) 348 lb (157.9 kg)   SpO2 96% Comment: walking from 98-90% room air  BMI 59.73 kg/m   Gen: NAD, resting comfortably CV: RRR with no murmurs appreciated Pulm: NWOB, CTAB with no crackles, wheezes, or rhonchi GI: Normal bowel sounds present. Soft, Nontender, Nondistended. MSK: 1+ pitting edema bilaterally, 2+ dp bilat, cyanosis, or clubbing noted Skin: warm, dry Neuro: grossly normal, moves all extremities Psych: Normal affect and thought content  No results found for this or any previous visit (from the past 72 hour(s)).   Assessment/Plan:  Dyspnea Dyspnea. PFTs inconclusive due to poor effort, possibly restrictive. Pt reports better effort with inhalers. Pt symptoms concerning for possible heart failure including PND, DOE. Recent BNP low, but  may be artificually low due to obesity. Since pt has improvement w/ trelegy, will continue triple therapy. Trelegy not covered by pt insurance, so will trial dulera and spiriva. Pt instructed to cont. Albuterol rescue as well. Will get echo to eval for heart failure. Pt has upcoming apt with [facility] over the next month, told pt the importance of going to these apts. Strict return precaustions discussed w/ pt.   Type 2 diabetes mellitus with complication (HCC) Pt CBGs still uncontrolled. Tolerating increase in metformin. Will also trial jardiance. Pt to come back in 1 mo to cont to f/u on DM w/ CBG log.    Lab Orders  No laboratory test(s) ordered today    Meds ordered this encounter  Medications  . tiotropium (SPIRIVA HANDIHALER) 18 MCG inhalation capsule    Sig: Place 1 capsule (18 mcg total) into inhaler and inhale daily. Inhale two times, once a day    Dispense:  30 capsule    Refill:  12  . mometasone-formoterol (DULERA) 100-5 MCG/ACT AERO    Sig: Inhale 2 puffs into the lungs daily.    Dispense:  1 Inhaler    Refill:  12  . empagliflozin (JARDIANCE) 10 MG TABS tablet    Sig: Take 10 mg by mouth daily.    Dispense:  30 tablet    Refill:  12  . metFORMIN (GLUCOPHAGE) 500 MG tablet    Sig: Take 2 tablets (1,000 mg total) by mouth 2 (two) times daily with a meal.    Thomes DinningBrad Thompson, MD, MS  FAMILY MEDICINE RESIDENT - PGY1 09/13/2017 4:46 PM

## 2017-09-13 NOTE — Telephone Encounter (Signed)
**  After Hours/ Emergency Line Call*  Received a call to report that Pervis HockingDebbie Cuffe is unable to get her diabetes medication because the "fax did not go through". I resent the medication for her to her preferred pharmacy, metformin 1000 mg BID, 90 day supply. Will forward to PCP.   Tillman SersAngela C Riccio, DO PGY-2, [facility]

## 2017-09-13 NOTE — Assessment & Plan Note (Signed)
Dyspnea. PFTs inconclusive due to poor effort, possibly restrictive. Pt reports better effort with inhalers. Pt symptoms concerning for possible heart failure including PND, DOE. Recent BNP low, but may be artificually low due to obesity. Since pt has improvement w/ trelegy, will continue triple therapy. Trelegy not covered by pt insurance, so will trial dulera and spiriva. Pt instructed to cont. Albuterol rescue as well. Will get echo to eval for heart failure. Pt has upcoming apt with [facility] over the next month, told pt the importance of going to these apts. Strict return precaustions discussed w/ pt.

## 2017-09-16 HISTORY — PX: TRANSTHORACIC ECHOCARDIOGRAM: SHX275

## 2017-09-17 ENCOUNTER — Telehealth (INDEPENDENT_AMBULATORY_CARE_PROVIDER_SITE_OTHER): Payer: Self-pay | Admitting: Orthopaedic Surgery

## 2017-09-17 NOTE — Telephone Encounter (Signed)
Patient called requesting a refill of Tramadol.  Please call patient to advise [phone]

## 2017-09-18 ENCOUNTER — Ambulatory Visit (HOSPITAL_COMMUNITY)
Admission: RE | Admit: 2017-09-18 | Discharge: 2017-09-18 | Disposition: A | Discharge status: Home / Self Care | Payer: Medicaid Other | Source: Ambulatory Visit | Attending: Family Medicine | Admitting: Family Medicine

## 2017-09-18 ENCOUNTER — Other Ambulatory Visit (INDEPENDENT_AMBULATORY_CARE_PROVIDER_SITE_OTHER): Payer: Self-pay

## 2017-09-18 DIAGNOSIS — Z6841 Body Mass Index (BMI) 40.0 and over, adult: Secondary | ICD-10-CM | POA: Diagnosis not present

## 2017-09-18 DIAGNOSIS — R0600 Dyspnea, unspecified: Secondary | ICD-10-CM

## 2017-09-18 DIAGNOSIS — J984 Other disorders of lung: Secondary | ICD-10-CM

## 2017-09-18 DIAGNOSIS — R0609 Other forms of dyspnea: Secondary | ICD-10-CM

## 2017-09-18 MED ORDER — TRAMADOL HCL 50 MG PO TABS: 50.0000 mg | ORAL_TABLET | Freq: Two times a day (BID) | ORAL | 0 refills | Status: DC | PRN

## 2017-09-18 NOTE — Progress Notes (Signed)
  Echocardiogram 2D Echocardiogram has been performed.  Rita Bass, Rita Bass F 09/18/2017, 2:29 PM

## 2017-09-18 NOTE — Telephone Encounter (Signed)
Please advise,

## 2017-09-18 NOTE — Telephone Encounter (Signed)
30

## 2017-09-18 NOTE — Telephone Encounter (Signed)
CALLED INTO PHARM  

## 2017-09-18 NOTE — Telephone Encounter (Signed)
CALLED PT NO ANSWER LMOM

## 2017-09-20 ENCOUNTER — Telehealth: Payer: Self-pay

## 2017-09-20 NOTE — Telephone Encounter (Signed)
Garnette Gunnerhompson, Aaron B, MD  [facility]        Please call pt and tell her that her heart scan was normal, but that she should still go to her appointment with cardiology. Thank you!

## 2017-09-20 NOTE — Telephone Encounter (Signed)
LVM for pt to call me back to discuss results.

## 2017-09-23 NOTE — Telephone Encounter (Signed)
Pt returned my call, I informed of of normal scan results and to still keep apt with cardiology. Pt voiced understanding.

## 2017-09-27 ENCOUNTER — Ambulatory Visit: Payer: Medicaid Other | Admitting: Family Medicine

## 2017-10-01 ENCOUNTER — Ambulatory Visit (INDEPENDENT_AMBULATORY_CARE_PROVIDER_SITE_OTHER): Payer: Medicaid Other | Admitting: Pulmonary Disease

## 2017-10-01 ENCOUNTER — Encounter: Payer: Self-pay | Admitting: Pulmonary Disease

## 2017-10-01 VITALS — BP 132/68 | HR 85 | Ht 64.0 in | Wt 343.0 lb

## 2017-10-01 DIAGNOSIS — R0683 Snoring: Secondary | ICD-10-CM

## 2017-10-01 DIAGNOSIS — Z6841 Body Mass Index (BMI) 40.0 and over, adult: Secondary | ICD-10-CM

## 2017-10-01 DIAGNOSIS — G4719 Other hypersomnia: Secondary | ICD-10-CM | POA: Diagnosis not present

## 2017-10-01 DIAGNOSIS — R0609 Other forms of dyspnea: Secondary | ICD-10-CM

## 2017-10-01 LAB — PULMONARY FUNCTION TEST
DL/VA % pred: 140 %
DL/VA: 6.73 ml/min/mmHg/L
DLCO UNC: 26.78 ml/min/mmHg
DLCO unc % pred: 110 %
FEF 25-75 Post: 2.96 L/sec
FEF 25-75 Pre: 2.35 L/sec
FEF2575-%Change-Post: 26 %
FEF2575-%PRED-POST: 107 %
FEF2575-%Pred-Pre: 84 %
FEV1-%CHANGE-POST: 12 %
FEV1-%PRED-PRE: 63 %
FEV1-%Pred-Post: 70 %
FEV1-POST: 1.99 L
FEV1-PRE: 1.77 L
FEV1FVC-%Change-Post: 0 %
FEV1FVC-%PRED-PRE: 110 %
FEV6-%Change-Post: 12 %
FEV6-%Pred-Post: 65 %
FEV6-%Pred-Pre: 58 %
FEV6-POST: 2.26 L
FEV6-Pre: 2.02 L
FEV6FVC-%PRED-POST: 103 %
FEV6FVC-%Pred-Pre: 103 %
FVC-%Change-Post: 12 %
FVC-%PRED-POST: 63 %
FVC-%PRED-PRE: 56 %
FVC-POST: 2.26 L
FVC-PRE: 2.02 L
PRE FEV6/FVC RATIO: 100 %
Post FEV1/FVC ratio: 88 %
Post FEV6/FVC ratio: 100 %
Pre FEV1/FVC ratio: 88 %
RV % PRED: 102 %
RV: 1.84 L
TLC % pred: 82 %
TLC: 4.17 L

## 2017-10-01 NOTE — Progress Notes (Signed)
[facility]  Chief Complaint  Patient presents with  . sleep consult    Referred by PCP for presumed sleep apnea. Epworth Score: 13    Constitutional: BP 132/68 (BP Location: Right Wrist, Cuff Size: Normal)   Pulse 85   Ht 5\' 4"  (1.626 m)   Wt (!) 343 lb (155.6 kg)   SpO2 95%   BMI 58.88 kg/m   History of Present Illness: Rita Bass is a 52 y.o. female with snoring and dyspnea.  She has noticed trouble with her breathing and sleep for about 1 year.  She gets winded with minimal activity.  She recovers after resting for a few minutes.  She gets occasional dry cough.  Denies wheeze, sputum, or chest congestion.  No history of asthma, allergies, or pneumonia.  Never smoked cigarettes.  Has tried using spiriva and dulera, but not sure these make any difference.  She snores when asleep, and her family has to wake her up to breath.  She will wake up feeling choked and like she is going to die.  She then feels scared to fall back to sleep.  She can't sleep on her back.  Her mouth gets dry at night.  She falls asleep randomly during the day, and doesn't realize she has fallen asleep.  She goes to sleep at 10 pm.  She falls asleep after  Couple of hours.  She wakes up several times to use the bathroom.  She gets out of bed at 7 am.  She feels tired in the morning.  She gets headaches all the time.  She does not use anything to help her fall sleep or stay awake.  She denies sleep walking, sleep talking, bruxism, or nightmares.  There is no history of restless legs.  She denies sleep hallucinations, sleep paralysis, or cataplexy.  The Epworth score is 13 out of 24.  Maintained SpO2 > 92% during ambulatory oximetry on room air today.  CXR 08/01/17 >> normal.   Comprehensive Respiratory Exam:  Appearance - well kempt  ENMT - nasal mucosa moist, turbinates clear, midline nasal septum, no dental lesions, no gingival bleeding, no oral exudates, no  tonsillar hypertrophy Neck - no masses, trachea midline, no thyromegaly, no elevation in JVP Respiratory - normal appearance of chest wall, normal respiratory effort w/o accessory muscle use, no dullness on percussion, no wheezing or rales CV - s1s2 regular rate and rhythm, no murmurs, 1+ peripheral edema, radial pulses symmetric GI - soft, non tender, no masses Lymph - no adenopathy noted in neck and axillary areas MSK - normal muscle strength and tone, normal gait Ext - no cyanosis, clubbing, or joint inflammation noted Skin - no rashes, lesions, or ulcers Neuro - oriented to person, place, and time Psych - normal mood and affect   CMP Latest Ref Rng & Units 08/01/2017 11/19/2016 09/03/2016  Glucose 65 - 99 mg/dL 643(P) 295(J) 884(Z)  BUN 6 - 20 mg/dL 5(L) 12 7  Creatinine 6.60 - 1.00 mg/dL 6.30 1.60 1.09  Sodium 135 - 145 mmol/L 137 133(L) 135  Potassium 3.5 - 5.1 mmol/L 3.8 4.7 4.0  Chloride 101 - 111 mmol/L 98(L) 99(L) 99(L)  CO2 22 - 32 mmol/L 26 25 28   Calcium 8.9 - 10.3 mg/dL 3.2(T) 9.2 5.5(D)  Total Protein 6.5 - 8.1 g/dL 7.4 7.1 7.5  Total Bilirubin 0.3 - 1.2 mg/dL 0.4 3.2(K) 0.4  Alkaline Phos 38 - 126 U/L 84 87 79  AST 15 - 41  U/L 23 49(H) 17  ALT 14 - 54 U/L 23 22 17     CBC Latest Ref Rng & Units 08/01/2017 11/19/2016 09/03/2016  WBC 4.0 - 10.5 K/uL 8.8 13.2(H) 13.0(H)  Hemoglobin 12.0 - 15.0 g/dL 09.812.1 10.3(L) 10.6(L)  Hematocrit 36.0 - 46.0 % 41.3 35.3(L) 36.2  Platelets 150 - 400 K/uL 366 325 355    Lab Results  Component Value Date   TSH 1.320 08/16/2017    BNP (last 3 results) Recent Labs    08/16/17 1540  BNP 16.4    Discussion: She has snoring, sleep disruption, witnessed apnea, and daytime sleepiness.  She has history of diabetes.  She could have sleep apnea.  She has dyspnea on exertion.  She is morbidly obese and lives sedentary lifestyle.  Her Echo and recent imaging studies were unrevealing.  Recent spirometry was suggestive of restrictive defect  likely from obesity, but she did have borderline bronchodilator response.  Assessment/Plan:  Snoring with excessive daytime sleepiness. - will need to arrange for a in lab sleep study  Obesity. - discussed how weight can impact sleep and risk for sleep disordered breathing - discussed options to assist with weight loss: combination of diet modification, cardiovascular and strength training exercises  Cardiovascular risk. - had an extensive discussion regarding the adverse health consequences related to untreated sleep disordered breathing - specifically discussed the risks for hypertension, coronary artery disease, cardiac dysrhythmias, cerebrovascular disease, and diabetes - lifestyle modification discussed  Safe driving practices. - discussed how sleep disruption can increase risk of accidents, particularly when driving - safe driving practices were discussed  Therapies for obstructive sleep apnea. - if the sleep study shows significant sleep apnea, then various therapies for treatment were reviewed: CPAP, oral appliance, and surgical interventions  Dyspnea on exertion. - likely from obesity with deconditioning - will arrange for full set of PFTs - can continue spiriva and dulera for now - would likely benefit from monitored exercise program   Patient Instructions  Will schedule pulmonary function test Will arrange for in lab sleep study Will call to arrange for follow up after sleep study reviewed     Coralyn HellingVineet Alzina Golda, MD [facility] 10/01/2017, 2:07 PM  Flow Sheet  Pulmonary tests: CT chest 02/04/15 >> lung clear Spirometry 08/22/17 >> FEV1 1.79 (69%), FEV1% 83, +BD  Sleep tests:  Cardiac tests: Echo 09/18/17 >> EF 55 to 60%, grade 1 DD  Review of Systems:  Constitutional: Positive for fatigue. Negative for fever and unexpected weight change.  HENT: Negative for congestion, dental problem, ear pain, nosebleeds, postnasal drip, rhinorrhea, sinus  pressure, sneezing, sore throat and trouble swallowing.   Eyes: Negative for redness and itching.  Respiratory: Positive for shortness of breath. Negative for cough, chest tightness and wheezing.   Cardiovascular: Negative for palpitations and leg swelling.  Gastrointestinal: Negative for nausea and vomiting.  Genitourinary: Negative for dysuria.  Musculoskeletal: Negative for joint swelling.  Skin: Negative for rash.  Neurological: Negative for headaches.  Hematological: Does not bruise/bleed easily.  Psychiatric/Behavioral: Negative for dysphoric mood. The patient is not nervous/anxious.    Past Medical History: She  has a past medical history of Arthritis, Asthma, Chronic pain of left knee, Complication of anesthesia, GERD (gastroesophageal reflux disease), H/O bronchitis, Kidney stones, Obesity, Renal disorder, Shortness of breath dyspnea, and Sleep apnea in adult.  Past Surgical History: She  has a past surgical history that includes Oophorectomy; Cesarean section; Cholecystectomy; Hernia repair; Tubal ligation; and Kidney stone surgery.  Family History:  Her family history includes Breast cancer in her mother; Heart attack in her maternal aunt; Other in her father.  Social History: She  reports that she has never smoked. She has never used smokeless tobacco. She reports that she does not drink alcohol or use drugs.  Medications: Allergies as of 10/01/2017   No Known Allergies     Medication List        Accurate as of 10/01/17  2:07 PM. Always use your most recent med list.          ACCU-CHEK SOFTCLIX LANCETS lancets Use as instructed   albuterol 108 (90 Base) MCG/ACT inhaler Commonly known as:  PROVENTIL HFA;VENTOLIN HFA Inhale 1-2 puffs into the lungs every 6 (six) hours as needed for wheezing or shortness of breath.   empagliflozin 10 MG Tabs tablet Commonly known as:  JARDIANCE Take 10 mg by mouth daily.   glucose blood test strip Commonly known as:  ACCU-CHEK  AVIVA Use as instructed   ibuprofen 200 MG tablet Commonly known as:  ADVIL,MOTRIN Take 400 mg by mouth every 6 (six) hours as needed for moderate pain.   metFORMIN 500 MG tablet Commonly known as:  GLUCOPHAGE Take 2 tablets (1,000 mg total) by mouth 2 (two) times daily with a meal.   mometasone-formoterol 100-5 MCG/ACT Aero Commonly known as:  DULERA Inhale 2 puffs into the lungs daily.   omeprazole 40 MG capsule Commonly known as:  PRILOSEC Take 1 capsule (40 mg total) by mouth daily.   tiotropium 18 MCG inhalation capsule Commonly known as:  SPIRIVA HANDIHALER Place 1 capsule (18 mcg total) into inhaler and inhale daily. Inhale two times, once a day   traMADol 50 MG tablet Commonly known as:  ULTRAM Take 2 tablets (100 mg total) by mouth every 6 (six) hours as needed.   traMADol 50 MG tablet Commonly known as:  ULTRAM Take 1 tablet (50 mg total) by mouth 2 (two) times daily as needed.

## 2017-10-01 NOTE — Patient Instructions (Signed)
Will schedule pulmonary function test Will arrange for in lab sleep study Will call to arrange for follow up after sleep study reviewed  

## 2017-10-01 NOTE — Progress Notes (Signed)
   Subjective:    Patient ID: Rita HockingDebbie Bass, female    DOB: [birth year], 52 y.o.   MRN: [id number]  HPI    Review of Systems  Constitutional: Positive for fatigue. Negative for fever and unexpected weight change.  HENT: Negative for congestion, dental problem, ear pain, nosebleeds, postnasal drip, rhinorrhea, sinus pressure, sneezing, sore throat and trouble swallowing.   Eyes: Negative for redness and itching.  Respiratory: Positive for shortness of breath. Negative for cough, chest tightness and wheezing.   Cardiovascular: Negative for palpitations and leg swelling.  Gastrointestinal: Negative for nausea and vomiting.  Genitourinary: Negative for dysuria.  Musculoskeletal: Negative for joint swelling.  Skin: Negative for rash.  Neurological: Negative for headaches.  Hematological: Does not bruise/bleed easily.  Psychiatric/Behavioral: Negative for dysphoric mood. The patient is not nervous/anxious.        Objective:   Physical Exam        Assessment & Plan:

## 2017-10-01 NOTE — Progress Notes (Signed)
PFT completed 10/01/17 patient was unable to complete pleth portion of the test.

## 2017-10-02 ENCOUNTER — Other Ambulatory Visit: Payer: Self-pay | Admitting: Obstetrics and Gynecology

## 2017-10-02 DIAGNOSIS — Z1231 Encounter for screening mammogram for malignant neoplasm of breast: Secondary | ICD-10-CM

## 2017-10-08 ENCOUNTER — Telehealth: Payer: Self-pay | Admitting: Pulmonary Disease

## 2017-10-08 NOTE — Telephone Encounter (Signed)
PFT 10/01/17 >> FEV1 1.90, FEV1% 88, TLC 4.17 (82%), DLCO 110%, +BD   Please let her know her PFT shows mild changes of asthma with COPD.  She should continue dulera and spiriva.

## 2017-10-09 ENCOUNTER — Ambulatory Visit: Payer: Medicaid Other | Admitting: Physician Assistant

## 2017-10-09 NOTE — Telephone Encounter (Signed)
Called and spoke with patient regarding results.  Informed the patient of results and recommendations today. Pt verbalized understanding and denied any questions or concerns at this time.  Nothing further needed.  

## 2017-10-11 ENCOUNTER — Ambulatory Visit (INDEPENDENT_AMBULATORY_CARE_PROVIDER_SITE_OTHER): Payer: Medicaid Other | Admitting: Pharmacist

## 2017-10-11 ENCOUNTER — Encounter: Payer: Self-pay | Admitting: Pharmacist

## 2017-10-11 DIAGNOSIS — E118 Type 2 diabetes mellitus with unspecified complications: Secondary | ICD-10-CM

## 2017-10-11 MED ORDER — LIRAGLUTIDE 18 MG/3ML ~~LOC~~ SOPN: 0.6000 mg | PEN_INJECTOR | Freq: Every day | SUBCUTANEOUS | 1 refills | Status: DC

## 2017-10-11 MED ORDER — "PEN NEEDLES 1/2"" 29G X 12MM MISC": 1 refills | Status: DC

## 2017-10-11 MED ORDER — EMPAGLIFLOZIN 25 MG PO TABS: 25.0000 mg | ORAL_TABLET | Freq: Every day | ORAL | 1 refills | Status: DC

## 2017-10-11 MED ORDER — LIRAGLUTIDE 18 MG/3ML ~~LOC~~ SOPN: 0.6000 mg | PEN_INJECTOR | Freq: Every day | SUBCUTANEOUS | 0 refills | Status: DC

## 2017-10-11 NOTE — Progress Notes (Signed)
   S:     Chief Complaint  Patient presents with  . Medication Management    Diabetes    Patient arrives today with her two grandchildren. Presents for diabetes and lung function follow-up. Patient was referred by Dr. Janee Mornhompson (referred on 08/16/2017).  Patient was last seen by Primary Care Provider on 09/13/2017. She recently saw her Pulmonologist (Dr. Craige CottaSood, 10/01/2017) where PFTs were performed. She was continued on Dulera and Symbicort, but does not endorse much improvement in breathing. She hs a sleep study planned to evaluate for sleep apnea. She endorses significant drowsiness over the past few weeks. She notes that she has been walking to the mailbox and around her yard, but that this causes shortness of breath, as well as the act of walking up 5 porch steps.   She also reports good tolerability after titration of metformin and initiation of Jardiance; denies gastrointestinal upset or genitourinary infections. She does note drinking lots of fluids recently, but relates this more to dry mouth from inhaled medications.  Insurance coverage/medication affordability: [place] [employer]  Patient reports adherence with medications.  Current diabetes medications include: metformin 1000 mg BID, Jardiance (empagliflozin) 10 mg daily Current hypertension medications include: none  Patient denies hypoglycemic events.  Patient-reported exercise habits: Has been walking to the mailbox daily   Patient reports polyuria and nocturia (4x/night) Patient denies neuropathy. Patient denies visual changes. Patient endorses polydipsia, but contributes to dry mouth (Spiriva possible culprit)   O:  Physical Exam  Constitutional: She appears well-developed and well-nourished.  Pulmonary/Chest:  Shortness of breath with exercise - walked twice around the inside of building without stopping and Sat remained> 91% during entire time.   Musculoskeletal: She exhibits edema.  Vitals reviewed.  Review of Systems    Constitutional: Positive for malaise/fatigue.  Respiratory: Positive for shortness of breath.   Musculoskeletal: Positive for joint pain.  All other systems reviewed and are negative. Knee pain continues.    Lab Results  Component Value Date   HGBA1C 9.1 (A) 08/16/2017   Vitals:   10/11/17 0930  BP: 134/82  Pulse: 96  SpO2: 97%    Lipid Panel     Component Value Date/Time   CHOL 171 08/16/2017 1540   TRIG 158 (H) 08/16/2017 1540   HDL 49 08/16/2017 1540   CHOLHDL 3.5 08/16/2017 1540   LDLCALC 90 08/16/2017 1540    Home fasting CBG: range 185-210; weighted more towards 200s  Clinical ASCVD: No   A/P: Diabetes longstanding currently uncontrolled on maximal metformin and Jardiance (empagliflozin) 10 mg daily. Patient is able to verbalize appropriate hypoglycemia management plan. Patient is adherent with medication. Control is suboptimal due to insulin resistance, diet, obesity. -Increase Jardiance (empagliflozin) to 25 mg daily, new prescription provided.  -Initiate Victoza (liraglutide) at 0.6 mg daily. Can titrate to 1.2 mg daily as tolerated. Provided sample Victoza today plus new presription.  -Continue metformin 1000 mg BID. -Extensively discussed pathophysiology of DM, recommended lifestyle interventions, dietary effects on glycemic control -Counseled on s/sx of and management of hypoglycemia -Next A1C anticipated 10/2017  Shortness of breath - chronic remains uncontrolled despite use of inhaler medication. Pending assessment of sleep associated breathing disturbances.   Written patient instructions provided.  Total time in face to face counseling 30 minutes.   Follow up Pharmacist Clinic Visit in 3-4 weeks.   Patient seen with Thomes CakeWendy Sun, PharmD Candidate and Catie Feliz Beamravis, PharmD,  PGY2 Pharmacy Resident.

## 2017-10-11 NOTE — Assessment & Plan Note (Signed)
Diabetes longstanding currently uncontrolled on maximal metformin and Jardiance (empagliflozin) 10 mg daily. Patient is able to verbalize appropriate hypoglycemia management plan. Patient is adherent with medication. Control is suboptimal due to insulin resistance, diet, obesity. -Increase Jardiance (empagliflozin) to 25 mg daily, new prescription provided.  -Initiate Victoza (liraglutide) at 0.6 mg daily. Can titrate to 1.2 mg daily as tolerated. Provided sample Victoza today plus new presription.  -Continue metformin 1000 mg BID. -Extensively discussed pathophysiology of DM, recommended lifestyle interventions, dietary effects on glycemic control -Counseled on s/sx of and management of hypoglycemia -Next A1C anticipated 10/2017

## 2017-10-11 NOTE — Patient Instructions (Addendum)
It was great to see you today!   Today, we will make a change for diabetes:  1) Start VICTOZA 0.6 mg once daily for 1 week then increase to 1.2mg  once daily.  2) Increase Jardiance to 25 mg daily. You may take 2 of the 10 mg tablets daily until you finish your supply at home Continue metformin 1000 mg (2x 500 mg tablets) twice daily  Keep checking your blood sugars every day: first thing in the morning before eating breakfast and about 2 hours after your largest meal of the day.    Schedule an appointment to follow up with pharmacy clinic in 3-4 weeks.

## 2017-10-15 ENCOUNTER — Telehealth: Payer: Self-pay | Admitting: Family Medicine

## 2017-10-15 NOTE — Telephone Encounter (Signed)
Will forward to MD to advise. Jazmin Hartsell,CMA  

## 2017-10-15 NOTE — Telephone Encounter (Signed)
Pt has a sleep apea test scheduled for Sunday Aug 4. Sometimes Rita Bass has trouble falling asleep. Rita Bass is concerned Rita Bass may not be able to go to sleep that night. Rita Bass would like dr Janee Mornthompson to call in something so Rita Bass can go to sleep that night.  Pharmacy is [facility] [address].  Please let pt know when the meds have been called in.

## 2017-10-16 NOTE — Telephone Encounter (Signed)
Will forward to MD. Rita Bass,CMA  

## 2017-10-16 NOTE — Telephone Encounter (Signed)
Pt is calling to check and see if Dr. Janee Mornhompson will be able to call a medication in to help her sleep for her sleep study she is having done on Sunday. She would need this by the weekend so she doesn't have to risk rescheduling the sleep study. Pt would like to be contacted as soon as possible.

## 2017-10-17 ENCOUNTER — Ambulatory Visit: Payer: Medicaid Other | Admitting: Cardiology

## 2017-10-17 ENCOUNTER — Encounter: Payer: Self-pay | Admitting: Cardiology

## 2017-10-17 VITALS — BP 127/81 | HR 86 | Ht 64.0 in | Wt 341.2 lb

## 2017-10-17 DIAGNOSIS — G4733 Obstructive sleep apnea (adult) (pediatric): Secondary | ICD-10-CM | POA: Diagnosis not present

## 2017-10-17 DIAGNOSIS — R000 Tachycardia, unspecified: Secondary | ICD-10-CM

## 2017-10-17 DIAGNOSIS — Z9189 Other specified personal risk factors, not elsewhere classified: Secondary | ICD-10-CM | POA: Diagnosis not present

## 2017-10-17 DIAGNOSIS — R0609 Other forms of dyspnea: Secondary | ICD-10-CM
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
## 2017-10-17 NOTE — Telephone Encounter (Signed)
Please call pt. We do not rx meds for sleep study. Thx

## 2017-10-17 NOTE — Telephone Encounter (Signed)
LM for patient ok per DPR.  Recommended OTC melatonin and also trying chamomile tea to see if this would relax her so she can get some sleep before her study.  Richerd Grime,CMA

## 2017-10-17 NOTE — Patient Instructions (Signed)
Medication Instructions:   NO CHANGE  Testing/Procedures:  Your physician has recommended that you have a cardiopulmonary stress test (CPX). CPX testing is a non-invasive measurement of heart and lung function. It replaces a traditional treadmill stress test. This type of test provides a tremendous amount of information that relates not only to your present condition but also for future outcomes. This test combines measurements of you ventilation, respiratory gas exchange in the lungs, electrocardiogram (EKG), blood pressure and physical response before, during, and following an exercise protocol.    Follow-Up:  Your physician recommends that you schedule a follow-up appointment in: 2 MONTHS [facility]

## 2017-10-18 ENCOUNTER — Ambulatory Visit: Payer: Medicaid Other | Admitting: Family Medicine

## 2017-10-19 ENCOUNTER — Encounter: Payer: Self-pay | Admitting: Cardiology

## 2017-10-19 NOTE — Assessment & Plan Note (Signed)
Pending Polysomnogram -- clearly related to dyspnea.   By echo, no sign of RV failure.

## 2017-10-19 NOTE — Assessment & Plan Note (Signed)
She has  has had sinus tachycardia previously diagnosed, but is currently not tachycardic. Would avoid beta-blockers based on reactive lung disease.  Could consider non-dihydropyridine calcium channel blocker -->  diltiazem or verapamil   Will wait to see what her heart rate response to exercise is.

## 2017-10-19 NOTE — Assessment & Plan Note (Signed)
Unfortunate this makes evaluation very difficult.  We will try to evaluate with a CPX (cardiopulmonary exercise test), but would resort to CORONARY CT ANGIOGRAM if there is a concern based on these results.  Needs to lose weight!! Consider Bariatric Sgx evaluation.

## 2017-10-19 NOTE — Assessment & Plan Note (Signed)
Borderline Metabolic Syndrome - Obesity, DM-2 & borderline TG levels.  With DM2, goal LDL < 100 - is currently 90.  Ischemic evaluation with CPX (Cardiopulmonary Exercise Test)

## 2017-10-19 NOTE — Assessment & Plan Note (Signed)
This is clearly multifactorial.  I agree there is probably a high likelihood of having obesity hypoventilation syndrome (or simply obesity causing restrictive lung disease), deconditioning and clearly some pulmonary issues. Ischemic evaluation would be very difficult on her, because the stress test would have a high likelihood of being inaccurate due to her large pendulous breasts.  Echocardiographic imaging was very poor. Perhaps the best direct ischemic evaluation will be on a Coronary/Cardiac CT Angiogram, but I am not convinced that her symptoms are anginal in nature.     Plan: We will evaluate her dyspnea with cardiopulmonary exercise testing.  Depending on that result may need to resort to Coronary CT Angiogram.   Her dyspnea could simply be related to obesity itself

## 2017-10-20 ENCOUNTER — Ambulatory Visit (HOSPITAL_BASED_OUTPATIENT_CLINIC_OR_DEPARTMENT_OTHER): Payer: Medicaid Other | Attending: Pulmonary Disease | Admitting: Pulmonary Disease

## 2017-10-20 VITALS — Ht 64.0 in | Wt 341.0 lb

## 2017-10-20 DIAGNOSIS — G4719 Other hypersomnia: Secondary | ICD-10-CM

## 2017-10-20 DIAGNOSIS — R0683 Snoring: Secondary | ICD-10-CM | POA: Diagnosis not present

## 2017-10-20 DIAGNOSIS — Z6841 Body Mass Index (BMI) 40.0 and over, adult: Secondary | ICD-10-CM | POA: Diagnosis not present

## 2017-10-20 DIAGNOSIS — G4733 Obstructive sleep apnea (adult) (pediatric): Secondary | ICD-10-CM

## 2017-10-20 DIAGNOSIS — E119 Type 2 diabetes mellitus without complications: Secondary | ICD-10-CM | POA: Insufficient documentation

## 2017-10-21 ENCOUNTER — Encounter (HOSPITAL_COMMUNITY): Payer: Medicaid Other

## 2017-10-23 ENCOUNTER — Other Ambulatory Visit: Payer: Self-pay

## 2017-10-23 ENCOUNTER — Emergency Department (HOSPITAL_COMMUNITY)
Admission: EM | Admit: 2017-10-23 | Discharge: 2017-10-23 | Disposition: A | Discharge status: Home / Self Care | Payer: Medicaid Other | Attending: Emergency Medicine | Admitting: Emergency Medicine

## 2017-10-23 ENCOUNTER — Ambulatory Visit
Admission: RE | Admit: 2017-10-23 | Discharge: 2017-10-23 | Disposition: A | Discharge status: Home / Self Care | Payer: Medicaid Other | Source: Ambulatory Visit | Attending: Obstetrics and Gynecology | Admitting: Obstetrics and Gynecology

## 2017-10-23 ENCOUNTER — Encounter (HOSPITAL_COMMUNITY): Payer: Self-pay

## 2017-10-23 DIAGNOSIS — E119 Type 2 diabetes mellitus without complications: Secondary | ICD-10-CM | POA: Insufficient documentation

## 2017-10-23 DIAGNOSIS — J45909 Unspecified asthma, uncomplicated: Secondary | ICD-10-CM | POA: Insufficient documentation

## 2017-10-23 DIAGNOSIS — R110 Nausea: Secondary | ICD-10-CM | POA: Insufficient documentation

## 2017-10-23 DIAGNOSIS — L02211 Cutaneous abscess of abdominal wall: Secondary | ICD-10-CM | POA: Insufficient documentation

## 2017-10-23 DIAGNOSIS — Z79899 Other long term (current) drug therapy: Secondary | ICD-10-CM | POA: Insufficient documentation

## 2017-10-23 DIAGNOSIS — R51 Headache: Secondary | ICD-10-CM | POA: Diagnosis not present

## 2017-10-23 DIAGNOSIS — Z7984 Long term (current) use of oral hypoglycemic drugs: Secondary | ICD-10-CM | POA: Diagnosis not present

## 2017-10-23 DIAGNOSIS — Z1231 Encounter for screening mammogram for malignant neoplasm of breast: Secondary | ICD-10-CM | POA: Diagnosis not present

## 2017-10-23 LAB — CBC WITH DIFFERENTIAL/PLATELET
Abs Immature Granulocytes: 0.1 10*3/uL (ref 0.0–0.1)
BASOS ABS: 0 10*3/uL (ref 0.0–0.1)
Basophils Relative: 0 %
EOS ABS: 0.4 10*3/uL (ref 0.0–0.7)
EOS PCT: 3 %
HCT: 41 % (ref 36.0–46.0)
HEMOGLOBIN: 12.1 g/dL (ref 12.0–15.0)
Immature Granulocytes: 1 %
LYMPHS PCT: 22 %
Lymphs Abs: 2.6 10*3/uL (ref 0.7–4.0)
MCH: 20.9 pg — AB (ref 26.0–34.0)
MCHC: 29.5 g/dL — AB (ref 30.0–36.0)
MCV: 70.8 fL — ABNORMAL LOW (ref 78.0–100.0)
MONO ABS: 0.7 10*3/uL (ref 0.1–1.0)
Monocytes Relative: 6 %
Neutro Abs: 7.9 10*3/uL — ABNORMAL HIGH (ref 1.7–7.7)
Neutrophils Relative %: 68 %
Platelets: 350 10*3/uL (ref 150–400)
RBC: 5.79 MIL/uL — ABNORMAL HIGH (ref 3.87–5.11)
RDW: 17.9 % — AB (ref 11.5–15.5)
WBC: 11.6 10*3/uL — ABNORMAL HIGH (ref 4.0–10.5)

## 2017-10-23 LAB — COMPREHENSIVE METABOLIC PANEL
ALK PHOS: 85 U/L (ref 38–126)
ALT: 20 U/L (ref 0–44)
AST: 15 U/L (ref 15–41)
Albumin: 3.7 g/dL (ref 3.5–5.0)
Anion gap: 13 (ref 5–15)
BILIRUBIN TOTAL: 0.9 mg/dL (ref 0.3–1.2)
BUN: 8 mg/dL (ref 6–20)
CALCIUM: 9.1 mg/dL (ref 8.9–10.3)
CO2: 26 mmol/L (ref 22–32)
CREATININE: 0.64 mg/dL (ref 0.44–1.00)
Chloride: 98 mmol/L (ref 98–111)
GFR calc Af Amer: >60 mL/min (ref >60)
GLUCOSE: 201 mg/dL — AB (ref 70–99)
POTASSIUM: 3.8 mmol/L (ref 3.5–5.1)
Sodium: 137 mmol/L (ref 135–145)
TOTAL PROTEIN: 7.9 g/dL (ref 6.5–8.1)

## 2017-10-23 LAB — I-STAT BETA HCG BLOOD, ED (MC, WL, AP ONLY)

## 2017-10-23 LAB — I-STAT CG4 LACTIC ACID, ED: Lactic Acid, Venous: 1.82 mmol/L (ref 0.5–1.9)

## 2017-10-23 MED ORDER — CEPHALEXIN 250 MG PO CAPS
500.0000 mg | ORAL_CAPSULE | Freq: Once | ORAL | Status: AC
  Administered 2017-10-23: 500 mg via ORAL
  Filled 2017-10-23: qty 2

## 2017-10-23 MED ORDER — IBUPROFEN 200 MG PO TABS
600.0000 mg | ORAL_TABLET | Freq: Once | ORAL | Status: AC
  Administered 2017-10-23: 600 mg via ORAL
  Filled 2017-10-23: qty 1

## 2017-10-23 MED ORDER — LIDOCAINE-EPINEPHRINE (PF) 2 %-1:200000 IJ SOLN
20.0000 mL | Freq: Once | INTRAMUSCULAR | Status: AC
  Administered 2017-10-23: 20 mL via INTRADERMAL
  Filled 2017-10-23: qty 20

## 2017-10-23 MED ORDER — CEPHALEXIN 500 MG PO CAPS: 500.0000 mg | ORAL_CAPSULE | Freq: Four times a day (QID) | ORAL | 0 refills | Status: DC

## 2017-10-23 NOTE — ED Notes (Signed)
Pt alert and oriented x4 skin warm and dry. Respirations equal and unlabored. Pt states she believes she got bit on her stomach, unknown insect. Pt has appears to have a tennis size ball cyst on right lower quadrant of abdomen and half a tennis size cyst on on left lower abdomen. Cyst appears to be swollen and red. No drainage visible from cyst.

## 2017-10-23 NOTE — Discharge Instructions (Addendum)
You can take Tylenol or ibuprofen for pain Keep wound clean with warm soap and water and keep bandage dry, do not submerge in water for 24 hours. Change bandage sooner if it gets dirty or soaked Move drain back and forth to promote drainage of the abscess Take Keflex (antibiotic) for infection Follow up with your doctor for a wound recheck in 2 days Have drain removed in 1 week Return for fever, increased redness, swelling, pain, or worsening drainage

## 2017-10-23 NOTE — ED Provider Notes (Addendum)
[facility] Note   CSN: [id number] Arrival date & time: 10/23/17  1739     History   Chief Complaint Chief Complaint  Patient presents with  . Insect Bite    HPI Rita Bass is a 52 y.o. female who presents with possible insect bite and abscess.  Past medical history significant for obesity, diabetes, recurrent abscesses.  She states that over the past 2 days she has had a gradually worsening area of redness and swelling over the right lower abdominal wall.  She reports a history of recurrent abscesses which sometimes spontaneously drained on their own but sometimes she has to have incision and drainage.  She states she actually has a small one which is resolving over the left abdominal wall currently.  She states that she thought she was bit by an insect because she felt a sharp pain over her right lower abdomen.  The area has become very red and swollen today therefore she came to the ED.  She reports an associated headache and nausea but denies fever currently.  HPI  Past Medical History:  Diagnosis Date  . Arthritis   . Asthma   . Chronic pain of left knee   . Complication of anesthesia   . GERD (gastroesophageal reflux disease)   . H/O bronchitis   . Kidney stones   . Obesity   . Renal disorder   . Shortness of breath dyspnea   . Sleep apnea in adult    Polysomnogram pending.  Followed by Dr. Craige CottaSood    Patient Active Problem List   Diagnosis Date Noted  . Type 2 diabetes mellitus with complication (HCC) 09/13/2017  . Dyspnea on exertion 08/16/2017  . At risk for acute ischemic cardiac event 08/16/2017  . Hyperglycemia 08/16/2017  . Sleep apnea 08/16/2017  . Gastroesophageal reflux disease 08/16/2017  . Chronic pain of right knee 07/13/2016  . Acute bronchitis 02/04/2015  . Morbid obesity (HCC) 02/04/2015  . Sinus tachycardia 02/04/2015  . Menorrhagia with regular cycle 11/18/2014    Past Surgical History:    Procedure Laterality Date  . CESAREAN SECTION     6 c-sections  . CHOLECYSTECTOMY    . HERNIA REPAIR    . KIDNEY STONE SURGERY    . OOPHORECTOMY    . TRANSTHORACIC ECHOCARDIOGRAM  09/2017   Technically difficult study.  Did not use Definity contrast.  EF was 55 to 60% with moderate LVH and grade 1 diastolic dysfunction.  No significant valvular lesions noted.  No regional wall motion normality but difficult to assess due to poor imaging.   . TUBAL LIGATION       OB History    Gravida  6   Para  6   Term  5   Preterm  1   AB  0   Living  7     SAB  0   TAB  0   Ectopic  0   Multiple  2   Live Births               Home Medications    Prior to Admission medications   Medication Sig Start Date End Date Taking? Authorizing Provider  ACCU-CHEK SOFTCLIX LANCETS lancets Use as instructed 08/22/17   Moses MannersHensel, William A, MD  albuterol (PROVENTIL HFA;VENTOLIN HFA) 108 (90 Base) MCG/ACT inhaler Inhale 1-2 puffs into the lungs every 6 (six) hours as needed for wheezing or shortness of breath. 08/16/17   Garnette Gunnerhompson, Aaron B, MD  cephALEXin (KEFLEX) 500 MG capsule Take 1 capsule (500 mg total) by mouth 4 (four) times daily. 10/23/17   Bethel Born, PA-C  empagliflozin (JARDIANCE) 25 MG TABS tablet Take 25 mg by mouth daily. 10/11/17   Moses Manners, MD  glucose blood (ACCU-CHEK AVIVA) test strip Use as instructed 08/22/17   Moses Manners, MD  ibuprofen (ADVIL,MOTRIN) 200 MG tablet Take 400 mg by mouth every 6 (six) hours as needed for moderate pain.    [provider]  Insulin Pen Needle (PEN NEEDLES 29GX1/2") 29G X MISC Use to inject Victoza once daily 10/11/17   Moses Manners, MD  liraglutide (VICTOZA) 18 MG/3ML SOPN Inject 0.1-0.2 mLs (0.6-1.2 mg total) into the skin daily. 10/11/17   Moses Manners, MD  metFORMIN (GLUCOPHAGE) 500 MG tablet Take 2 tablets (1,000 mg total) by mouth 2 (two) times daily with a meal. 09/13/17   Riccio, Marcell Anger, DO   mometasone-formoterol (DULERA) 100-5 MCG/ACT AERO Inhale 2 puffs into the lungs daily. 09/13/17   Garnette Gunner, MD  omeprazole (PRILOSEC) 40 MG capsule Take 1 capsule (40 mg total) by mouth daily. 08/16/17   Garnette Gunner, MD  tiotropium (SPIRIVA HANDIHALER) 18 MCG inhalation capsule Place 1 capsule (18 mcg total) into inhaler and inhale daily. Inhale two times, once a day 09/13/17   Garnette Gunner, MD  traMADol (ULTRAM) 50 MG tablet Take 1 tablet (50 mg total) by mouth 2 (two) times daily as needed. 09/18/17   Tarry Kos, MD    Family History Family History  Problem Relation Age of Onset  . Breast cancer Mother   . Other Father        committed suicide  . Heart attack Maternal Aunt     Social History Social History   Tobacco Use  . Smoking status: Never Smoker  . Smokeless tobacco: Never Used  Substance Use Topics  . Alcohol use: No  . Drug use: No     Allergies   Patient has no known allergies.   Review of Systems Review of Systems  Constitutional: Negative for fever.  Gastrointestinal: Positive for nausea. Negative for vomiting.  Skin: Positive for color change and wound.  Neurological: Positive for headaches.  All other systems reviewed and are negative.    Physical Exam Updated Vital Signs BP 125/84   Pulse 91   Temp 98.8 F (37.1 C) (Oral)   Resp 18   Ht 5\' 4"  (1.626 m)   Wt (!) 154.7 kg (341 lb)   SpO2 100%   BMI 58.53 kg/m   Physical Exam  Constitutional: She is oriented to person, place, and time. She appears well-developed and well-nourished. No distress.  HENT:  Head: Normocephalic and atraumatic.  Eyes: Pupils are equal, round, and reactive to light. Conjunctivae are normal. Right eye exhibits no discharge. Left eye exhibits no discharge. No scleral icterus.  Neck: Normal range of motion.  Cardiovascular: Normal rate and regular rhythm.  Pulmonary/Chest: Effort normal and breath sounds normal. No respiratory distress.  Abdominal:  She exhibits no distension.  Neurological: She is alert and oriented to person, place, and time.  Skin: Skin is warm and dry.  5x4cm fluctuant abscess over right lower abdominal wall with large surrounding area of erythema and induration  1cm area over the left lower abdominal wall which is draining purulent fluid  Psychiatric: She has a normal mood and affect. Her behavior is normal.  Nursing note and vitals reviewed.  ED Treatments / Results  Labs (all labs ordered are listed, but only abnormal results are displayed) Labs Reviewed  COMPREHENSIVE METABOLIC PANEL - Abnormal; Notable for the following components:      Result Value   Glucose, Bld 201 (*)    All other components within normal limits  CBC WITH DIFFERENTIAL/PLATELET - Abnormal; Notable for the following components:   WBC 11.6 (*)    RBC 5.79 (*)    MCV 70.8 (*)    MCH 20.9 (*)    MCHC 29.5 (*)    RDW 17.9 (*)    Neutro Abs 7.9 (*)    All other components within normal limits  I-STAT CG4 LACTIC ACID, ED  I-STAT BETA HCG BLOOD, ED ([facility], [facility], [facility] ONLY)    EKG None  Radiology Mm Digital Screening Bilateral  Result Date: 10/23/2017 CLINICAL DATA:  Screening. EXAM: DIGITAL SCREENING BILATERAL MAMMOGRAM WITH CAD COMPARISON:  Previous exam(s). ACR Breast Density Category a: The breast tissue is almost entirely fatty. FINDINGS: There are no findings suspicious for malignancy. Images were processed with CAD. IMPRESSION: No mammographic evidence of malignancy. A result letter of this screening mammogram will be mailed directly to the patient. RECOMMENDATION: Screening mammogram in one year. (Code:SM-B-01Y) BI-RADS CATEGORY  1: Negative. Electronically Signed   By: Manfred Arch M.D.   On: 10/23/2017 16:05    Procedures .Marland KitchenIncision and Drainage Date/Time: 10/23/2017 9:47 PM Performed by: Bethel Born, PA-C Authorized by: Bethel Born, PA-C   Consent:    Consent obtained:  Verbal   Consent given by:   Patient   Risks discussed:  Bleeding, incomplete drainage, pain and damage to other organs   Alternatives discussed:  No treatment Universal protocol:    Procedure explained and questions answered to patient or proxy's satisfaction: yes     Relevant documents present and verified: yes     Test results available and properly labeled: yes     Imaging studies available: yes     Required blood products, implants, devices, and special equipment available: yes     Site/side marked: yes     Immediately prior to procedure a time out was called: yes     Patient identity confirmed:  Verbally with patient Location:    Type:  Abscess   Size:  5x4cm Pre-procedure details:    Skin preparation:  Betadine Anesthesia (see MAR for exact dosages):    Anesthesia method:  Local infiltration   Local anesthetic:  Lidocaine 2% WITH epi Procedure type:    Complexity:  Simple Procedure details:    Incision types:  Stab incision   Incision depth:  Subcutaneous   Scalpel blade:  11   Wound management:  Probed and deloculated, irrigated with saline and extensive cleaning   Drainage:  Purulent   Drainage amount:  Moderate   Packing material: Drain placed. Post-procedure details:    Patient tolerance of procedure:  Tolerated well, no immediate complications   (including critical care time)    Medications Ordered in ED Medications  lidocaine-EPINEPHrine (XYLOCAINE W/EPI) 2 %-1:200000 (PF) injection 20 mL (20 mLs Intradermal Given 10/23/17 2014)  ibuprofen (ADVIL,MOTRIN) tablet 600 mg (600 mg Oral Given 10/23/17 2118)  cephALEXin (KEFLEX) capsule 500 mg (500 mg Oral Given 10/23/17 2118)     Initial Impression / Assessment and Plan / ED Course  I have reviewed the triage vital signs and the nursing notes.  Pertinent labs & imaging results that were available during my care of the patient were reviewed  by me and considered in my medical decision making (see chart for details).  52 year old female with  fluctuant abscess was amenable to incision and drainage over the right abdominal wall for the past couple of days.  She has a history of the same.  She is initially mildly tachycardic and hypertensive on arrival.  This has resolved on recheck. I&D performed and patient tolerated well. A drain was placed. She has a f/u with her PCP on Friday. Discussed wound care and signs of infection (fever, chills, increasing pain, redness, or drainage at site). Keflex fx given due to diabetes. Return precautions given.   Final Clinical Impressions(s) / ED Diagnoses   Final diagnoses:  Abscess of abdominal wall    ED Discharge Orders        Ordered    cephALEXin (KEFLEX) 500 MG capsule  4 times daily     10/23/17 2137       Bethel Born, PA-C 10/23/17 2148    Bethel Born, PA-C 10/23/17 2148    Charlynne Pander, MD 10/24/17 [phone]

## 2017-10-23 NOTE — ED Notes (Signed)
Wounds dressed after I&D with gauze, cleaned with normal saline

## 2017-10-23 NOTE — ED Notes (Signed)
ID at bed side.

## 2017-10-23 NOTE — ED Triage Notes (Signed)
Pt endorses being bit by something on lower stomach yesterday that started as a small bite and then got bigger today, painful to the touch, area is bluish/red and inflamed. Denies fever or chills.

## 2017-10-25 ENCOUNTER — Encounter: Payer: Self-pay | Admitting: Family Medicine

## 2017-10-25 ENCOUNTER — Ambulatory Visit: Payer: Medicaid Other | Admitting: Family Medicine

## 2017-10-25 ENCOUNTER — Telehealth: Payer: Self-pay

## 2017-10-25 ENCOUNTER — Other Ambulatory Visit: Payer: Self-pay

## 2017-10-25 VITALS — BP 120/82 | HR 97 | Temp 97.8°F | Ht 64.0 in | Wt 338.0 lb

## 2017-10-25 DIAGNOSIS — L602 Onychogryphosis: Secondary | ICD-10-CM | POA: Diagnosis not present

## 2017-10-25 DIAGNOSIS — L02211 Cutaneous abscess of abdominal wall: Secondary | ICD-10-CM | POA: Diagnosis not present

## 2017-10-25 DIAGNOSIS — I5032 Chronic diastolic (congestive) heart failure: Secondary | ICD-10-CM | POA: Diagnosis not present

## 2017-10-25 DIAGNOSIS — E1121 Type 2 diabetes mellitus with diabetic nephropathy: Secondary | ICD-10-CM | POA: Diagnosis not present

## 2017-10-25 DIAGNOSIS — I5030 Unspecified diastolic (congestive) heart failure: Secondary | ICD-10-CM | POA: Insufficient documentation

## 2017-10-25 DIAGNOSIS — G4700 Insomnia, unspecified: Secondary | ICD-10-CM | POA: Insufficient documentation

## 2017-10-25 DIAGNOSIS — E118 Type 2 diabetes mellitus with unspecified complications: Secondary | ICD-10-CM | POA: Diagnosis not present

## 2017-10-25 DIAGNOSIS — Z1211 Encounter for screening for malignant neoplasm of colon: Secondary | ICD-10-CM | POA: Diagnosis not present

## 2017-10-25 DIAGNOSIS — Z23 Encounter for immunization: Secondary | ICD-10-CM

## 2017-10-25 DIAGNOSIS — L0291 Cutaneous abscess, unspecified: Secondary | ICD-10-CM | POA: Insufficient documentation

## 2017-10-25 LAB — POCT GLYCOSYLATED HEMOGLOBIN (HGB A1C): HbA1c, POC (controlled diabetic range): 8.8 % — AB (ref 0.0–7.0)

## 2017-10-25 LAB — POCT UA - MICROALBUMIN
CREATININE, POC: 200 mg/dL
Microalbumin Ur, POC: 150 mg/L

## 2017-10-25 MED ORDER — LISINOPRIL 10 MG PO TABS: 10.0000 mg | ORAL_TABLET | Freq: Every day | ORAL | 3 refills | Status: DC

## 2017-10-25 MED ORDER — IBUPROFEN 200 MG PO TABS
400.0000 mg | ORAL_TABLET | Freq: Four times a day (QID) | ORAL | 0 refills | Status: DC | PRN
Start: 2017-10-25 — End: 2017-12-16

## 2017-10-25 MED ORDER — LIRAGLUTIDE 18 MG/3ML ~~LOC~~ SOPN: 1.8000 mg | PEN_INJECTOR | Freq: Every day | SUBCUTANEOUS | 0 refills | Status: DC

## 2017-10-25 MED ORDER — RAMELTEON 8 MG PO TABS: 8.0000 mg | ORAL_TABLET | Freq: Every day | ORAL | 0 refills | Status: DC

## 2017-10-25 MED ORDER — INSULIN GLARGINE 100 UNIT/ML SOLOSTAR PEN: 20.0000 [IU] | PEN_INJECTOR | Freq: Every day | SUBCUTANEOUS | 11 refills | Status: DC

## 2017-10-25 NOTE — Assessment & Plan Note (Signed)
Slight improvement of A1c 8.8.  Given patient has ongoing infection, get improved glycemic control.  Will increase Victoza to 1.8 daily, and start Lantus 20 units daily.  Patient return in 1 week with glucose log 4 times a day.  Discussed hypo-glycemic symptoms and strict return precautions given.

## 2017-10-25 NOTE — Progress Notes (Signed)
Subjective:  Rita Bass is a 52 y.o. female who presents to the [facility] today to follow-up on diabetes.   HPI:  Diabetes Patient reports cutting out sugary sodas for the past month.  She has switched to drinking Gatorade "drinking 10 bottles a day".  She has been regular on her medications including metformin thousand mill grams twice daily, Victoza 1.2 mg daily, empagliflozin 25 mg daily.  She denies any negative side effects from this.  A1c checked today was 8.8, down from 9.1 to 3 months ago.  Patient said that her sugars have been slightly better in the 170s at home, she brought her log to bring today.  She has been walking outside.  She has lost 3 more pounds since last visit.  Abdominal abscess Patient was seen in ED 2 days ago.  Was found to have right lower abdominal wall skin abscess requiring incision and drainage with drain placed.  Patient was placed on 5-day course of Keflex.  Patient has not been having any fevers or chills.  Pain is improved since I&D and antibiotics.  Insomnia/OSA Patient reports chronic insomnia.  Recently went to sleep study to evaluate for OSA.  Patient was unable to complete the sleep study as she did not fall asleep long enough for the study to complete.  Patient denies any caffeine intake during the day.  No other stimulants as well.  Says that she is always had difficulty sleeping.  She has not tried anything for sleeping.  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  Reports having difficulty with she is in them rubbing inside of her foot and her nails nails as her shoes are not fitting well.   ROS: Per HPI  PMH: Smoking history reviewed.    Objective:  Physical Exam: BP 120/82   Pulse 97   Temp 97.8 F (36.6 C) (Oral)   Ht 5\' 4"  (1.626 m)   Wt (!) 338 lb (153.3 kg)   SpO2 96%   BMI 58.02 kg/m   Gen: NAD, resting comfortably CV: RRR with no murmurs appreciated Pulm: NWOB, CTAB with no  crackles, wheezes, or rhonchi Abdomen: Right lower quadrant with incision and wound drain in place, area surrounding is erythematous but per patient is improving, mildly tender, purulent drainage coming from drain and on bandage MSK: no edema, cyanosis, or clubbing noted Skin: warm, dry Neuro: grossly normal, moves all extremities Psych: Normal affect and thought content Diabetic Foot Exam - Simple   Simple Foot Form Diabetic Foot exam was performed with the following findings:  Yes 10/25/2017  3:02 PM  Visual Inspection No deformities, no ulcerations, no other skin breakdown bilaterally:  Yes Sensation Testing Intact to touch and monofilament testing bilaterally:  Yes Pulse Check Posterior Tibialis and Dorsalis pulse intact bilaterally:  Yes Comments Onychogryphosis of second and third nails bilaterally, with extreme curvature and nail starting to cut into under portion of the toes.     Results for orders placed or performed in visit on 10/25/17 (from the past 72 hour(s))  HgB A1c     Status: Abnormal   Collection Time: 10/25/17 10:10 AM  Result Value Ref Range   Hemoglobin A1C     HbA1c POC (<> result, manual entry)     HbA1c, POC (prediabetic range)     HbA1c, POC (controlled diabetic range) 8.8 (A) 0.0 - 7.0 %  POCT UA - Microalbumin     Status: Abnormal   Collection Time:  10/25/17 10:30 AM  Result Value Ref Range   Microalbumin Ur, POC 150 mg/L   Creatinine, POC 200 mg/dL   Albumin/Creatinine Ratio, Urine, POC 30-300      Assessment/Plan:  Diabetic nephropathy associated with type 2 diabetes mellitus (HCC) Diabetic nephropathy as defined by increased loss of albumin protein in urine.  Patient left office before results returned.  Patient to be called will start low-dose lisinopril 10 mg daily.  Patient get labs drawn 1 week to check creatinine.  Continue to monitor proteinuria.  Type 2 diabetes mellitus with complication (HCC) Slight improvement of A1c 8.8.  Given patient  has ongoing infection, get improved glycemic control.  Will increase Victoza to 1.8 daily, and start Lantus 20 units daily.  Patient return in 1 week with glucose log 4 times a day.  Discussed hypo-glycemic symptoms and strict return precautions given.  Abscess of skin Abdominal wall skin abscess status post I&D with drain in place.  Drainage purulent still coming out.  Does not appear to be improved.  Have patient continue Keflex course started at ED.  Patient return in 1 week to have drain removed.  Patient have wound dry and clean in the interim.  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  Will refer to podiatry for nail treatment and diabetic shoe fitting.  Insomnia Chronic insomnia.  Possibly due to obesity hypo-ventilation syndrome difficulty breathing.  Will trial ramelteon.  If improved sleeping, consider re-referring for sleep study.    Lab Orders     HgB A1c     POCT UA - Microalbumin  Meds ordered this encounter  Medications  . liraglutide (VICTOZA) 18 MG/3ML SOPN    Sig: Inject 0.3 mLs (1.8 mg total) into the skin daily.    Dispense:  1 pen    Refill:  0    Order Specific Question:   Lot Number?    Answer:   [id number]    Order Specific Question:   Expiration Date?    Answer:   07/17/2018    Order Specific Question:   Quantity    Answer:   1  . Insulin Glargine (LANTUS) 100 UNIT/ML Solostar Pen    Sig: Inject 20 Units into the skin daily. Take after largest meal.    Dispense:  15 mL    Refill:  11  . ibuprofen (ADVIL,MOTRIN) 200 MG tablet    Sig: Take 2 tablets (400 mg total) by mouth every 6 (six) hours as needed for moderate pain.    Dispense:  30 tablet    Refill:  0  . ramelteon (ROZEREM) 8 MG tablet    Sig: Take 1 tablet (8 mg total) by mouth at bedtime.    Dispense:  30 tablet    Refill:  0  . lisinopril (PRINIVIL,ZESTRIL) 10 MG tablet    Sig: Take 1 tablet (10 mg total) by mouth daily.    Dispense:  30 tablet      Refill:  3    Thomes DinningBrad Billye Pickerel, MD, MS FAMILY MEDICINE RESIDENT - PGY2 10/25/2017 4:53 PM

## 2017-10-25 NOTE — Assessment & Plan Note (Signed)
Chronic insomnia.  Possibly due to obesity hypo-ventilation syndrome difficulty breathing.  Will trial ramelteon.  If improved sleeping, consider re-referring for sleep study.

## 2017-10-25 NOTE — Assessment & Plan Note (Signed)
Diabetic nephropathy as defined by increased loss of albumin protein in urine.  Patient left office before results returned.  Patient to be called will start low-dose lisinopril 10 mg daily.  Patient get labs drawn 1 week to check creatinine.  Continue to monitor proteinuria.

## 2017-10-25 NOTE — Patient Instructions (Addendum)
It was a pleasure to see you today! Thank you for choosing [facility] for your primary care. Pervis HockingDebbie Manship was seen for Diabetes and abscess.   1. Come back to clinic in 1 week to have your abscess checked and drain removed.   2. For your diabetes: Increase Victoza to 1.8 mg a day, Start Insulin at 20 units after your largest meal (usually in the morning), check your blood sugar 4 times a day. Keep a log of your sugars. We will discuss at next weeks apointment.   3. For your insomnia, take Ramelton at night.    Meds ordered this encounter  Medications  . liraglutide (VICTOZA) 18 MG/3ML SOPN    Sig: Inject 0.3 mLs (1.8 mg total) into the skin daily.    Dispense:  1 pen    Refill:  0    Order Specific Question:   Lot Number?    Answer:   [id number]    Order Specific Question:   Expiration Date?    Answer:   07/17/2018    Order Specific Question:   Quantity    Answer:   1  . Insulin Glargine (LANTUS) 100 UNIT/ML Solostar Pen    Sig: Inject 20 Units into the skin daily. Take after largest meal.    Dispense:  15 mL    Refill:  11  . ibuprofen (ADVIL,MOTRIN) 200 MG tablet    Sig: Take 2 tablets (400 mg total) by mouth every 6 (six) hours as needed for moderate pain.    Dispense:  30 tablet    Refill:  0    Orders Placed This Encounter  Procedures  . Pneumococcal polysaccharide vaccine 23-valent greater than or equal to 2yo subcutaneous/IM  . Ambulatory referral to Ophthalmology    Referral Priority:   Routine    Referral Type:   Consultation    Referral Reason:   Specialty Services Required    Requested Specialty:   Ophthalmology    Number of Visits Requested:   1  . Ambulatory referral to Podiatry    Referral Priority:   Routine    Referral Type:   Consultation    Referral Reason:   Specialty Services Required    Requested Specialty:   Podiatry    Number of Visits Requested:   1  . Ambulatory referral to Gastroenterology    Referral Priority:   Routine    Referral  Type:   Consultation    Referral Reason:   Specialty Services Required    Number of Visits Requested:   1  . HgB A1c  . POCT UA - Microalbumin     Best,  Thomes DinningBrad Rance Smithson, MD, MS FAMILY MEDICINE RESIDENT - PGY1 10/25/2017 10:38 AM

## 2017-10-25 NOTE — Assessment & Plan Note (Signed)
Gross nail deformities leading to foot abrasions and poor fitting shoe wear.  Will refer to podiatry for nail treatment and diabetic shoe fitting.

## 2017-10-25 NOTE — Telephone Encounter (Signed)
Prior approval for Rozerem completed via [place] [employer].  Med approved for 10/25/17 - 04/23/18. Prior approval # [id number].  [facility] pharmacy informed. Ples SpecterAlisa Jahmeer Porche, RN [facility])

## 2017-10-25 NOTE — Assessment & Plan Note (Signed)
Abdominal wall skin abscess status post I&D with drain in place.  Drainage purulent still coming out.  Does not appear to be improved.  Have patient continue Keflex course started at ED.  Patient return in 1 week to have drain removed.  Patient have wound dry and clean in the interim.

## 2017-10-25 NOTE — Telephone Encounter (Signed)
[facility]rem[facility]  Form placed in PCP'sbox for completion along with Medicaid formulary.  Patient also request Ibuprofen be sent as a prescription for 800 mg so medicaid will pay for it.  Ples SpecterAlisa Brake, RN [facility])

## 2017-10-28 NOTE — Telephone Encounter (Signed)
-----   Message from Garnette GunnerAaron B Thompson, MD sent at 10/25/2017  4:43 PM EDT ----- Pt labs showed loss of protien in urine due to her diabetes. I am going to write an Rx for lisinopril for pt. Please call pt and inform them of this and to come have their labs drawn in 1 week to check kidney function. Thanks.

## 2017-10-28 NOTE — Telephone Encounter (Signed)
Spoke with patient and informed her of results and repeat labs.  She has an appointment with us this week and will plan to get them done then.  Myron Lona,CMA

## 2017-10-30 ENCOUNTER — Encounter: Payer: Self-pay | Admitting: Gastroenterology

## 2017-10-31 ENCOUNTER — Encounter: Payer: Self-pay | Admitting: Family Medicine

## 2017-10-31 ENCOUNTER — Ambulatory Visit (INDEPENDENT_AMBULATORY_CARE_PROVIDER_SITE_OTHER): Payer: Medicaid Other | Admitting: Family Medicine

## 2017-10-31 ENCOUNTER — Other Ambulatory Visit: Payer: Self-pay | Admitting: Family Medicine

## 2017-10-31 ENCOUNTER — Telehealth: Payer: Self-pay | Admitting: Pulmonary Disease

## 2017-10-31 ENCOUNTER — Other Ambulatory Visit: Payer: Self-pay

## 2017-10-31 VITALS — BP 122/82 | HR 79 | Temp 97.9°F | Ht 64.0 in | Wt 342.0 lb

## 2017-10-31 DIAGNOSIS — G8929 Other chronic pain: Secondary | ICD-10-CM | POA: Diagnosis not present

## 2017-10-31 DIAGNOSIS — L02211 Cutaneous abscess of abdominal wall: Secondary | ICD-10-CM

## 2017-10-31 DIAGNOSIS — G4733 Obstructive sleep apnea (adult) (pediatric): Secondary | ICD-10-CM

## 2017-10-31 DIAGNOSIS — M25561 Pain in right knee: Secondary | ICD-10-CM

## 2017-10-31 DIAGNOSIS — G4700 Insomnia, unspecified: Secondary | ICD-10-CM | POA: Diagnosis not present

## 2017-10-31 DIAGNOSIS — E1121 Type 2 diabetes mellitus with diabetic nephropathy: Secondary | ICD-10-CM | POA: Diagnosis not present

## 2017-10-31 DIAGNOSIS — R0683 Snoring: Secondary | ICD-10-CM | POA: Diagnosis not present

## 2017-10-31 DIAGNOSIS — Z1211 Encounter for screening for malignant neoplasm of colon: Secondary | ICD-10-CM | POA: Diagnosis not present

## 2017-10-31 MED ORDER — BACITRACIN-NEOMYCIN-POLYMYXIN 400-5-5000 EX OINT: 1.0000 "application " | TOPICAL_OINTMENT | Freq: Two times a day (BID) | CUTANEOUS | 0 refills | Status: DC

## 2017-10-31 MED ORDER — DICLOFENAC SODIUM 1 % TD GEL: 4.0000 g | Freq: Four times a day (QID) | TRANSDERMAL | 3 refills | Status: DC

## 2017-10-31 MED ORDER — DULOXETINE HCL 20 MG PO CPEP: 20.0000 mg | ORAL_CAPSULE | Freq: Every day | ORAL | 3 refills | Status: DC

## 2017-10-31 NOTE — Procedures (Signed)
    Patient Name: Rita Bass, Rita Bass  Study Date: 10/20/2017   Gender: Female  D.O.B: 18-Feb-1966  Age (years): 52  Referring Provider: Coralyn HellingVineet Athel Merriweather MD, ABSM  Height (inches): 64  Interpreting Physician: Coralyn HellingVineet Jamaria Amborn MD, ABSM  Weight (lbs): 341  RPSGT: Lowry RamMckinney, Takeya  BMI: 59  MRN: [id number]  Neck Size: 17.50  CLINICAL INFORMATION  Sleep Study Type: NPSG Indication for sleep study: Diabetes, Morbid Obesity, Snoring Epworth Sleepiness Score: 7 SLEEP STUDY TECHNIQUE  As per the AASM Manual for the Scoring of Sleep and Associated Events v2.3 (April 2016) with a hypopnea requiring 4% desaturations. The channels recorded and monitored were frontal, central and occipital EEG, electrooculogram (EOG), submentalis EMG (chin), nasal and oral airflow, thoracic and abdominal wall motion, anterior tibialis EMG, snore microphone, electrocardiogram, and pulse oximetry. MEDICATIONS  Medications self-administered by patient taken the night of the study : ADVIL PM SLEEP ARCHITECTURE  The study was initiated at 11:01:21 PM and ended at 5:47:07 AM. Sleep onset time was 6.3 minutes and the sleep efficiency was 64.1%%. The total sleep time was 260 minutes. Stage REM latency was 277.0 minutes. The patient spent 25.8%% of the night in stage N1 sleep, 53.5%% in stage N2 sleep, 12.3%% in stage N3 and 8.5% in REM. Alpha intrusion was absent. Supine sleep was 72.12%. She had difficulty with sleep onset and sleep maintenance due to respiratory events. RESPIRATORY PARAMETERS  The overall apnea/hypopnea index (AHI) was 39.5 per hour. There were 36 total apneas, including 36 obstructive, 0 central and 0 mixed apneas. There were 135 hypopneas and 19 RERAs. The AHI during Stage REM sleep was 84.5 per hour. AHI while supine was 22.7 per hour. The mean oxygen saturation was 88.5%. The minimum SpO2 during sleep was 60.0%. moderate snoring was noted during this study. She did not meet split night study protocol  due to insufficient sleep time at the beginning of the study. CARDIAC DATA  The 2 lead EKG demonstrated sinus rhythm. The mean heart rate was 73.4 beats per minute. Other EKG findings include: PVCs.  LEG MOVEMENT DATA  The total PLMS were 0 with a resulting PLMS index of 0.0. Associated arousal with leg movement index was 0.0 . IMPRESSIONS  - Severe obstructive sleep apnea with an AHI of 39.5 and SaO2 low of 60%. DIAGNOSIS  - Obstructive Sleep Apnea (327.23 [G47.33 ICD-10]) RECOMMENDATIONS  - Given the severity of her sleep apnea and oxygen desaturation she should be tried on CPAP. Options include returing to the sleep lab for a CPAP titration study or arranging for auto CPAP and doing overnight oximetry with auto CPAP. [Electronically signed] 10/31/2017 08:50 AM Coralyn HellingVineet Yair Dusza MD, ABSM  Diplomate, American Board of Sleep Medicine  NPI: [id number]

## 2017-10-31 NOTE — Addendum Note (Signed)
Addended by: Georges LynchSAUNDERS, Kimmora Risenhoover T on: 10/31/2017 02:16 PM   Modules accepted: Orders

## 2017-10-31 NOTE — Progress Notes (Signed)
Subjective:  Rita Bass is a 52 y.o. female who presents to [facility] today with a chief complaint of abdominal abscess follow up.   HPI:  Abdominal Abscess Pt reports that the pain has improved. That drainage has significantly decreased. She denies any fevers and chills. She says that she has not yet completed her course of Keflex, says she has about six doses remaining.   OSA Pt had recent sleep study indicating pt had severe OSA. Pt was unable to complete testing due to insomnia. Pulmonology recommended repeating sleep study vs. Auto-titrating cpap. Pt prefers the later.   Insomnia Pt recently started ramelton. Reports that her sleep has significantly improved, but she still has symptoms of gasping for air that wake her up. She is tolerating the Ramelton well without additional day time drowsiness.   Diabetic Nephropathy Pt recently diagnosed with diabetic nephropathy. She was recently started on low dose lisinopril for this. She is not having any cough, chest pain, worsening shortness of breath than her baseline, difficulty urinating, or change in urination, or dizziness. She has been taking lisinopril since last Tuesday.   Right knee pain Patient has right knee pain. Says that she has had difficulty with walking, even though she has been trying to be more active. Patient has history of knee osteoarthritis, last plain film was in 2015. Tramadol and ibuprofen have not been helping her with the pain.   ROS: Per HPI  PMH: Smoking history reviewed.    Objective:  Physical Exam: BP 122/82   Pulse 79   Temp 97.9 F (36.6 C) (Oral)   Ht 5\' 4"  (1.626 m)   Wt (!) 342 lb (155.1 kg)   SpO2 94%   BMI 58.70 kg/m   Gen: NAD, resting comfortably CV: RRR with no murmurs appreciated Pulm: NWOB, CTAB with no crackles, wheezes, or rhonchi GI: Left lower quadrant abdominal abscess with drain (removed in office), improved surrounding erythema, no expressible drainage, minimally tender  to palpation Skin: warm, dry MSK: Full ROM or right knee, no effusion, joint appears stable, pt albe to ambulate without cain No results found for this or any previous visit (from the past 72 hour(s)).   Assessment/Plan:  Chronic pain of right knee Trial diclofenac gel and Cymbalta for chronic pain. Repeat knee xray.   Insomnia Improved. Cont ramelteon. Encourage sleep hygene.   Diabetic nephropathy associated with type 2 diabetes mellitus (HCC) Pt on lisonopril 10 mg for renal protection. Recheck BMP to assess renal fxn.   Abscess of skin Improved. Drain removed today. Encouraged pt to apply polysporin and change bandage daily for next two weeks. Pt to return if not improving.   Sleep apnea Auto-titration CPAP order placed.   Special screening for malignant neoplasms, colon Pt needs colonoscopy. Order placed.     Lab Orders     Basic Metabolic Panel  Meds ordered this encounter  Medications  . diclofenac sodium (VOLTAREN) 1 % GEL    Sig: Apply 4 g topically 4 (four) times daily.    Dispense:  1 Tube    Refill:  3  . DULoxetine (CYMBALTA) 20 MG capsule    Sig: Take 1 capsule (20 mg total) by mouth daily.    Dispense:  30 capsule    Refill:  3  . neomycin-bacitracin-polymyxin (NEOSPORIN) ointment    Sig: Apply 1 application topically every 12 (twelve) hours.    Dispense:  15 g    Refill:  0    Thomes DinningBrad Acheron Sugg, MD,  MS FAMILY MEDICINE RESIDENT - PGY2 10/31/2017 2:12 PM

## 2017-10-31 NOTE — Assessment & Plan Note (Signed)
Auto-titration CPAP order placed.

## 2017-10-31 NOTE — Telephone Encounter (Signed)
Will have my nurse inform pt that sleep study shows severe sleep apnea.  Options are 1) CPAP now, 2) ROV first.  If She is agreeable to CPAP, then please send order for auto CPAP range 5 to 20 cm H2O with heated humidity and mask of choice.  Have download sent 1 month after starting CPAP and set up ROV 2 months after starting CPAP.  ROV can be with me or NP.  Please arrange for ONO with auto CPAP.

## 2017-10-31 NOTE — Assessment & Plan Note (Signed)
Pt needs colonoscopy. Order placed.

## 2017-10-31 NOTE — Telephone Encounter (Signed)
Called and spoke with patient regarding results.  Informed the patient of results and recommendations today. Placed order for auto CPAP range 5 to 20 cm H2O with heated humidity and mask of choice.   Have download sent 1 month after starting CPAP and set up ROV 2 months after starting CPAP.   Scheduled appt for 28mo f/u with VS on 01/14/2018 at 9:30am Placed order for ONO with auto CPAP. Pt verbalized understanding and denied any questions or concerns at this time.  Nothing further needed.

## 2017-10-31 NOTE — Assessment & Plan Note (Signed)
Improved. Drain removed today. Encouraged pt to apply polysporin and change bandage daily for next two weeks. Pt to return if not improving.

## 2017-10-31 NOTE — Assessment & Plan Note (Signed)
Improved. Cont ramelteon. Encourage sleep hygene.

## 2017-10-31 NOTE — Addendum Note (Signed)
Addended by: Cornell BarmanWHITTAKER, Sohum Delillo M on: 10/31/2017 05:15 PM   Modules accepted: Orders

## 2017-10-31 NOTE — Assessment & Plan Note (Signed)
Pt on lisonopril 10 mg for renal protection. Recheck BMP to assess renal fxn.

## 2017-10-31 NOTE — Patient Instructions (Signed)
It was a pleasure to see you today! Thank you for choosing [facility] for your primary care. Rita HockingDebbie Bass was seen for skin infection.   1. Your CPAP has been ordered. You will get it soon.   2. For your knee pain, we are starting two new medications as listed below.   3. Please get your blood work drawn today to recheck your kidneys.   4. Please change your wound dressing daily and apply the ointment prescribed. Come back to clinic if it gets worse, develop any fevers/chills, or there is worse drainage.   Meds ordered this encounter  Medications  . diclofenac sodium (VOLTAREN) 1 % GEL    Sig: Apply 4 g topically 4 (four) times daily.    Dispense:  1 Tube    Refill:  3  . DULoxetine (CYMBALTA) 20 MG capsule    Sig: Take 1 capsule (20 mg total) by mouth daily.    Dispense:  30 capsule    Refill:  3  . neomycin-bacitracin-polymyxin (NEOSPORIN) ointment    Sig: Apply 1 application topically every 12 (twelve) hours.    Dispense:  15 g    Refill:  0    Best,  Thomes DinningBrad Sister Carbone, MD, MS FAMILY MEDICINE RESIDENT - PGY1 10/31/2017 12:13 PM

## 2017-10-31 NOTE — Assessment & Plan Note (Signed)
Trial diclofenac gel and Cymbalta for chronic pain. Repeat knee xray.

## 2017-11-01 ENCOUNTER — Ambulatory Visit (INDEPENDENT_AMBULATORY_CARE_PROVIDER_SITE_OTHER): Payer: Medicaid Other | Admitting: Pharmacist

## 2017-11-01 ENCOUNTER — Encounter: Payer: Self-pay | Admitting: Pharmacist

## 2017-11-01 ENCOUNTER — Encounter: Payer: Self-pay | Admitting: Family Medicine

## 2017-11-01 DIAGNOSIS — E118 Type 2 diabetes mellitus with unspecified complications: Secondary | ICD-10-CM | POA: Diagnosis not present

## 2017-11-01 DIAGNOSIS — E1121 Type 2 diabetes mellitus with diabetic nephropathy: Secondary | ICD-10-CM | POA: Diagnosis not present

## 2017-11-01 LAB — BASIC METABOLIC PANEL
BUN / CREAT RATIO: 18 (ref 9–23)
BUN: 10 mg/dL (ref 6–24)
CALCIUM: 9.4 mg/dL (ref 8.7–10.2)
CO2: 27 mmol/L (ref 20–29)
Chloride: 100 mmol/L (ref 96–106)
Creatinine, Ser: 0.57 mg/dL (ref 0.57–1.00)
GFR, EST AFRICAN AMERICAN: 125 mL/min/{1.73_m2} (ref >59)
GFR, EST NON AFRICAN AMERICAN: 108 mL/min/{1.73_m2} (ref >59)
Glucose: 170 mg/dL — ABNORMAL HIGH (ref 65–99)
POTASSIUM: 4.4 mmol/L (ref 3.5–5.2)
Sodium: 141 mmol/L (ref 134–144)

## 2017-11-01 MED ORDER — INSULIN GLARGINE 100 UNIT/ML SOLOSTAR PEN: 24.0000 [IU] | PEN_INJECTOR | Freq: Every day | SUBCUTANEOUS | 11 refills | Status: DC

## 2017-11-01 NOTE — Progress Notes (Signed)
    S:     Chief Complaint  Patient presents with  . Medication Management    diabetes    Patient arrives ambulating and in pleasant spirits with her daughter, Martie LeeSabrina.  Presents for diabetes evaluation, education, and management. Patient was referred by Dr. Sherrie Georgehompson(referred on 08/16/2017).  Patient was last seen by Primary Care Provider Dr. Janee Mornhompson on 10/31/2017, where she was seen for follow-up regarding an abdominal abscess and open wound.  Patient reports adherence with medications.  Current diabetes medications include: empagliflozin (Jardiance) 25mg  once daily, insulin glargine (Lantus) 20 units once daily, metformin 500mg  two tablets BID, liraglutide (Victoza) 1.8mg  daily Current hypertension medications include: Lisinopril 10mg  Of note, she also takes Ibuprofen 200mg , two tablets every 6 hours for pain due to her abdominal abscess.   Patient denies hypoglycemic events.  Patient-reported exercise habits: She walks around her house and at stores with her family for physical activity. She definitely has noticed an improvement, where she can now fully walk around different stores without extreme difficulty with breathing. She also reports that she had her sleep study conducted, where severe sleep apnea was identified and reported that she will be getting a CPAP machine soon.   Patient endorses nocturia, wakes up about 2 times every night. Patient also endorses daily headaches, but also comments that there has been a lot going on at the home.   O:  Physical Exam  Constitutional: She appears well-developed and well-nourished.  Vitals reviewed.    Review of Systems  All other systems reviewed and are negative.    Lab Results  Component Value Date   HGBA1C 8.8 (A) 10/25/2017   Vitals:   11/01/17 1143  BP: 122/74  Pulse: 78  SpO2: 97%    Lipid Panel     Component Value Date/Time   CHOL 171 08/16/2017 1540   TRIG 158 (H) 08/16/2017 1540   HDL 49 08/16/2017 1540     CHOLHDL 3.5 08/16/2017 1540   LDLCALC 90 08/16/2017 1540    Average random CBGs: 140s-250s  Clinical ASCVD: No  ASCVD risk factors : age 50-75   A/P: Diabetes longstanding currently moderately controlled on metformin, liraglutide, empagliflozin, and insulin glargine. Patient is adherent with medication. Control is suboptimal due to lack of sleep and energy, but patient has noticed improvement. -Increased dose of basal insulin glargine (Lantus) to 24 units once daily.  -Continue liraglutide (Victoza) 1.8mg , empagliflozin (Jardiance) 25mg , and metformin 500mg  as prescribed. -Counseled to call for sugars lower than 100.  -Discussed implementing more walking into regimen, set goal of walking to the mailbox back and forth twice every day. -Discussed headaches may be due to stress and repeated ibuprofen taking, unrelated to blood glucose. -Next A1C anticipated 01/25/2018.   Hypertension longstanding currently controlled.  BP goal = 130/80 mmHg. Patient is adherent with medication.  -Continue lisinopril 10mg . -Implement more walking into exercise regimen as indicated above.   Written patient instructions provided.  Total time in face to face counseling 30 minutes.   Follow up PCP in 2 weeks, Pharmacist Clinic Visit in 4 weeks.   Patient seen with Caffie PintoAkshara Kumar, PharmD Candidate and Gwynneth AlbrightSara Nimer, PharmD, PGY1 Pharmacy Resident.

## 2017-11-01 NOTE — Progress Notes (Signed)
Patient informed while she was in the office seeing Dr. Raymondo BandKoval.  Burnard HawthorneJazmin Leonardo Makris,CMA

## 2017-11-01 NOTE — Assessment & Plan Note (Signed)
Diabetes longstanding currently moderately controlled on metformin, liraglutide, empagliflozin, and insulin glargine. Patient is adherent with medication. Control is suboptimal due to lack of sleep and energy, but patient has noticed improvement. -Increased dose of basal insulin glargine (Lantus) to 24 units once daily.  -Continue liraglutide (Victoza) 1.8mg , empagliflozin (Jardiance) 25mg , and metformin 500mg  as prescribed. -Counseled to call for sugars lower than 100.  -Discussed implementing more walking into regimen, set goal of walking to the mailbox back and forth twice every day. -Discussed headaches may be due to stress and repeated ibuprofen taking, unrelated to blood glucose.

## 2017-11-01 NOTE — Patient Instructions (Addendum)
It was nice seeing you today!  1. Increase your Lantus (insulin glargine) to 24 units daily. Give Dr. Raymondo BandKoval a call if you are seeing blood sugar numbers less than 100.  2. Start walking to your mailbox 7 days a week to help increase your daily exercise.  3. Follow up with Dr. Raymondo BandKoval in Pharmacy Clinic in 4 weeks.

## 2017-11-02 NOTE — Progress Notes (Signed)
Patient ID: Rita Bass, female   DOB: 05/26/1965, 52 y.o.   MRN: [id number] Reviewed: Agree with Dr. Koval's documentation and management. 

## 2017-11-04 ENCOUNTER — Encounter (HOSPITAL_COMMUNITY): Payer: Medicaid Other

## 2017-11-05 ENCOUNTER — Other Ambulatory Visit: Payer: Self-pay | Admitting: Family Medicine

## 2017-11-05 MED ORDER — FLUCONAZOLE 150 MG PO TABS: 150.0000 mg | ORAL_TABLET | Freq: Once | ORAL | 0 refills | Status: AC

## 2017-11-05 NOTE — Telephone Encounter (Signed)
Pt saw Dr. Janee Mornhompson on Thursday and was put on medications that she was told could possibly cause a yeast infection. She said she now is getting a yeast infection and would like to know if he could call in a medication that her Medicaid will cover. She says she would prefer to take a pill over using a cream. Pt would like to be contacted to let her know if he can do this for her.

## 2017-11-11 DIAGNOSIS — E119 Type 2 diabetes mellitus without complications: Secondary | ICD-10-CM | POA: Diagnosis not present

## 2017-11-14 ENCOUNTER — Encounter (HOSPITAL_COMMUNITY): Payer: Medicaid Other

## 2017-11-14 ENCOUNTER — Telehealth: Payer: Self-pay

## 2017-11-14 ENCOUNTER — Other Ambulatory Visit: Payer: Self-pay

## 2017-11-14 ENCOUNTER — Ambulatory Visit: Payer: Medicaid Other | Admitting: Family Medicine

## 2017-11-14 ENCOUNTER — Encounter: Payer: Self-pay | Admitting: Family Medicine

## 2017-11-14 DIAGNOSIS — R0609 Other forms of dyspnea: Secondary | ICD-10-CM | POA: Diagnosis not present

## 2017-11-14 DIAGNOSIS — L02211 Cutaneous abscess of abdominal wall: Secondary | ICD-10-CM

## 2017-11-14 DIAGNOSIS — E1121 Type 2 diabetes mellitus with diabetic nephropathy: Secondary | ICD-10-CM | POA: Diagnosis not present

## 2017-11-14 DIAGNOSIS — E118 Type 2 diabetes mellitus with unspecified complications: Secondary | ICD-10-CM

## 2017-11-14 DIAGNOSIS — M25561 Pain in right knee: Secondary | ICD-10-CM

## 2017-11-14 DIAGNOSIS — G8929 Other chronic pain: Secondary | ICD-10-CM | POA: Diagnosis not present

## 2017-11-14 MED ORDER — GLUCOSE BLOOD VI STRP: ORAL_STRIP | 12 refills | Status: DC

## 2017-11-14 MED ORDER — ACCU-CHEK SOFTCLIX LANCETS MISC: 12 refills | Status: DC

## 2017-11-14 NOTE — Assessment & Plan Note (Signed)
Will have Xray tomorrow and follow up with PCP. Patient did not know where to go.

## 2017-11-14 NOTE — Assessment & Plan Note (Signed)
Wound is healing, no erythema or drainage noted see picture above.

## 2017-11-14 NOTE — Assessment & Plan Note (Signed)
Patient continue to have elevated fasting BG while on current regimen. She is better controlled but still not at goal. Continue to work on therapeutic lifestyle changes as discussed. --Increase lantus to 30 units daily from 26 units --Continue with metformin 2000 mg, Jardiance 25 mg and Victoza 1.8 mg daily --Record BG two times a day --Follow up with Dr. Raymondo BandKoval on 9/16

## 2017-11-14 NOTE — Patient Instructions (Signed)
It was great seeing you today! We have addressed the following issues today  1. I will increase you Lantus to 30 units daily. Keep the rest of your medications the same. 2. Wound is healing well, no concerns there. 3. Do the X ray for your knee as discussed. 4. Follow up with Dr. Raymondo BandKoval in two weeks 9/16. 5. Schedule an appointment with Dr.Thompson after 9/16.  If we did any lab work today, and the results require attention, either me or my nurse will get in touch with you. If everything is normal, you will get a letter in mail and a message via . If you don't hear from us in two weeks, please give us a call. Otherwise, we look forward to seeing you again at your next visit. If you have any questions or concerns before then, please call the clinic at [phone].  Please bring all your medications to every doctors visit  Sign up for My Chart to have easy access to your labs results, and communication with your Primary care physician. Please ask Front Desk for some assistance.   Please check-out at the front desk before leaving the clinic.    Take Care,   Dr. Sydnee Cabaliallo

## 2017-11-14 NOTE — Progress Notes (Signed)
   Subjective:    Patient ID: Rita HockingDebbie Dimitroff, female    DOB: 05/17/1965, 52 y.o.   MRN: [id number]   CC: Follow up for T2DM  HPI: Patient is a 52 yo female who presents today to follow up on T2DM management. Patient was seen by Dr. Raymondo BandKoval two weeks ago and ask to increase Lantus to 24 units. Patient report she has been doing 26 units but still reports fasting blood glucose between 160-210's. She continue to be adherent to her the rest of her medication Victoza 1.8 mg, Empaglifozin 25 mg and Metfomin 2000 mg. Patient denies any polyuria and polydipsia. She continue to work on her exercise regimen and diet.  Smoking status reviewed   ROS: all other systems were reviewed and are negative other than in the HPI   Past Medical History:  Diagnosis Date  . Arthritis   . Asthma   . Chronic pain of left knee   . Complication of anesthesia   . GERD (gastroesophageal reflux disease)   . H/O bronchitis   . Kidney stones   . Obesity   . Renal disorder   . Shortness of breath dyspnea   . Sleep apnea in adult    Polysomnogram pending.  Followed by Dr. Craige CottaSood    Past Surgical History:  Procedure Laterality Date  . CESAREAN SECTION     6 c-sections  . CHOLECYSTECTOMY    . HERNIA REPAIR    . KIDNEY STONE SURGERY    . OOPHORECTOMY    . TRANSTHORACIC ECHOCARDIOGRAM  09/2017   Technically difficult study.  Did not use Definity contrast.  EF was 55 to 60% with moderate LVH and grade 1 diastolic dysfunction.  No significant valvular lesions noted.  No regional wall motion normality but difficult to assess due to poor imaging.   . TUBAL LIGATION      Past medical history, surgical, family, and social history reviewed and updated in the EMR as appropriate.  Objective:  BP 120/78   Pulse 85   Temp 97.7 F (36.5 C) (Oral)   Ht 5\' 4"  (1.626 m)   Wt (!) 336 lb (152.4 kg)   SpO2 94%   BMI 57.67 kg/m   Vitals and nursing note reviewed     General: Obese woman, NAD, pleasant, able to  participate in exam Cardiac: RRR, normal heart sounds, no murmurs. 2+ radial and PT pulses bilaterally Respiratory: CTAB, normal effort, No wheezes, rales or rhonchi Abdomen: soft, nontender, nondistended, no hepatic or splenomegaly, +BS Extremities: no edema or cyanosis. WWP. Skin: warm and dry, no rashes noted Neuro: alert and oriented x4, no focal deficits Psych: Normal affect and mood   Assessment & Plan:   Abscess of skin Wound is healing, no erythema or drainage noted see picture above.  Diabetic nephropathy associated with type 2 diabetes mellitus (HCC) Patient continue to have elevated fasting BG while on current regimen. She is better controlled but still not at goal. Continue to work on therapeutic lifestyle changes as discussed. --Increase lantus to 30 units daily from 26 units --Continue with metformin 2000 mg, Jardiance 25 mg and Victoza 1.8 mg daily --Record BG two times a day --Follow up with Dr. Raymondo BandKoval on 9/16  Chronic pain of right knee Will have Xray tomorrow and follow up with PCP. Patient did not know where to go.   Lovena NeighboursAbdoulaye Trygg Mantz, MD [facility] PGY-3

## 2017-11-14 NOTE — Telephone Encounter (Signed)
Pt called nurse line states her rx for lantus was increased from 24 to 30 u but new rx was not sent and she will run out of her current rx too fast using 30u. Please send new rx to [facility] [address] Pt call back [phone] Shawna OrleansMeredith B Thomsen, RN

## 2017-11-15 ENCOUNTER — Telehealth: Payer: Self-pay | Admitting: Family Medicine

## 2017-11-15 ENCOUNTER — Ambulatory Visit
Admission: RE | Admit: 2017-11-15 | Discharge: 2017-11-15 | Disposition: A | Discharge status: Home / Self Care | Payer: Medicaid Other | Source: Ambulatory Visit | Attending: Family Medicine | Admitting: Family Medicine

## 2017-11-15 ENCOUNTER — Encounter: Payer: Self-pay | Admitting: Family Medicine

## 2017-11-15 DIAGNOSIS — G8929 Other chronic pain: Secondary | ICD-10-CM

## 2017-11-15 DIAGNOSIS — E118 Type 2 diabetes mellitus with unspecified complications: Secondary | ICD-10-CM

## 2017-11-15 DIAGNOSIS — M25561 Pain in right knee: Principal | ICD-10-CM

## 2017-11-15 DIAGNOSIS — M1711 Unilateral primary osteoarthritis, right knee: Secondary | ICD-10-CM | POA: Diagnosis not present

## 2017-11-15 DIAGNOSIS — G4733 Obstructive sleep apnea (adult) (pediatric): Secondary | ICD-10-CM | POA: Diagnosis not present

## 2017-11-15 MED ORDER — INSULIN GLARGINE 100 UNIT/ML SOLOSTAR PEN: 30.0000 [IU] | PEN_INJECTOR | Freq: Every day | SUBCUTANEOUS | 11 refills | Status: DC

## 2017-11-15 NOTE — Telephone Encounter (Signed)
Will forward to MD. Jazmin Hartsell,CMA  

## 2017-11-15 NOTE — Telephone Encounter (Signed)
Called patient back and let her know that she would need to just do the right knee xray for now since there wasn't a significant complaint regarding the left knee.  She can discuss this at her next visit.  Jazmin Hartsell,CMA

## 2017-11-15 NOTE — Telephone Encounter (Signed)
Increased pt Rx as requested. Called pt and informed of this.

## 2017-11-15 NOTE — Telephone Encounter (Signed)
Pt wanted dr to know she picked up her Cpap machine today.

## 2017-11-15 NOTE — Telephone Encounter (Signed)
[facility] called and pt is there to have xray on her knee. They have an order for her right knee but tha pt said she needs to have left knee xray as well. They are asking if pt needs both done for the Orders to be re sent requesting both.

## 2017-11-17 DIAGNOSIS — G4733 Obstructive sleep apnea (adult) (pediatric): Secondary | ICD-10-CM | POA: Diagnosis not present

## 2017-11-23 ENCOUNTER — Other Ambulatory Visit: Payer: Self-pay | Admitting: Family Medicine

## 2017-11-23 DIAGNOSIS — G4700 Insomnia, unspecified: Secondary | ICD-10-CM

## 2017-11-27 ENCOUNTER — Ambulatory Visit (HOSPITAL_COMMUNITY): Payer: Medicaid Other | Attending: Cardiology

## 2017-11-27 DIAGNOSIS — R0609 Other forms of dyspnea: Secondary | ICD-10-CM | POA: Diagnosis not present

## 2017-11-27 DIAGNOSIS — R000 Tachycardia, unspecified: Secondary | ICD-10-CM | POA: Diagnosis not present

## 2017-11-27 DIAGNOSIS — R0600 Dyspnea, unspecified: Secondary | ICD-10-CM | POA: Insufficient documentation

## 2017-12-02 ENCOUNTER — Ambulatory Visit (INDEPENDENT_AMBULATORY_CARE_PROVIDER_SITE_OTHER): Payer: Medicaid Other | Admitting: Pharmacist

## 2017-12-02 DIAGNOSIS — E118 Type 2 diabetes mellitus with unspecified complications: Secondary | ICD-10-CM

## 2017-12-02 MED ORDER — EMPAGLIFLOZIN 25 MG PO TABS: 25.0000 mg | ORAL_TABLET | Freq: Every day | ORAL | 6 refills | Status: DC

## 2017-12-02 MED ORDER — INSULIN GLARGINE 100 UNIT/ML SOLOSTAR PEN: 36.0000 [IU] | PEN_INJECTOR | Freq: Every day | SUBCUTANEOUS | 11 refills | Status: DC

## 2017-12-02 MED ORDER — LIRAGLUTIDE 18 MG/3ML ~~LOC~~ SOPN: 1.8000 mg | PEN_INJECTOR | Freq: Every day | SUBCUTANEOUS | 5 refills | Status: DC

## 2017-12-02 MED ORDER — NYSTATIN 100000 UNIT/GM EX POWD: Freq: Four times a day (QID) | CUTANEOUS | 0 refills | Status: DC | PRN

## 2017-12-02 NOTE — Progress Notes (Signed)
    S:     Chief Complaint  Patient presents with  . Medication Management    diabetes    Patient arrives ambulating and in pleasant spirits alone.  Presents for diabetes evaluation, education, and management. Patient was referred by Dr. Sherrie Georgehompson(referred on 08/16/2017). At last visit with Dr. Sydnee Cabaliallo, patient's insulin was increased to 30 units daily and was instructed to measure BG twice daily.  Insurance coverage/medication affordability: Medicaid, reports no issues  Patient reports adherence with medications.  Current diabetes medications include: Victoza (liraglutide) 1.8mg  daily, Lantus (insulin glargine) 30 units daily, Jardiance (empagliflozin) 25mg  daily,  Metformin 500mg  2 tab BID Current hypertension medications include: lisinopril 10mg  daily  Exercise habits: She does endorse walking regularly around her house and to her mailbox and back as discussed in the last visit.  Breast Rash: Patient reports having a red, scaly rash underneath her left breast. She comments that it is bothersome and reports trying over the counter products, which did not provide relief.  "Blisters on Feet": Patient also reports having blisters and other complaints about both of her feet that she would like to be evaluated.  Patient also commented that her knee pain has been bothering her and would like that to be evaluated as well.  O:  Physical Exam  Constitutional: She appears well-developed and well-nourished.  Vitals reviewed. Did not examine feet or rash during this visit.  Review of Systems  All other systems reviewed and are negative.  Lab Results  Component Value Date   HGBA1C 8.8 (A) 10/25/2017   Vitals:   12/02/17 1129  BP: 138/60  Pulse: 80  SpO2: 96%    Lipid Panel     Component Value Date/Time   CHOL 171 08/16/2017 1540   TRIG 158 (H) 08/16/2017 1540   HDL 49 08/16/2017 1540   CHOLHDL 3.5 08/16/2017 1540   LDLCALC 90 08/16/2017 1540    Average CBG; 110-180, some  200s and 300s, but mostly 100s  Clinical ASCVD: No  ASCVD risk factors : age 50-75  A/P: Diabetes longstanding with improved control. Patient is able to verbalize appropriate hypoglycemia management plan. Patient is adherent with medication. -Increased dose of basal insulin Lantus (insulin glargine) from 30 to 36 units daily.  -Continued GLP-1 Victoza (generic name liraglutide) at 1.8mg  daily, Jardiance (empagliflozin) at 25mg  daily, and metformin 500mg  2 tab BID.  -Extensively discussed pathophysiology of DM, recommended lifestyle interventions, dietary effects on glycemic control -Counseled on s/sx of and management of hypoglycemia -Next A1C anticipated 01/25/2018.   Hypertension longstanding currently controlled.  BP goal = <130/80 mmHg. Patient is adherent with medication. -Continue lisinopril 10mg . -Counseled to continue walking per exercise regimen.  Breast Rash: uncontrolled  -Discussed with Dr. Lum BabeEniola. Started Nystatin powder 15g, use topically up to four times a day as needed for relief.  "Foot Blisters" and Knee Pain: not acute and deferred to PCP.  -Instructed to follow up with Dr. Janee Mornhompson (PCP) in regards to these complaints and breast rash.   Written patient instructions provided.  Total time in face to face counseling 30 minutes.   Follow up with PCP Dr. Janee Mornhompson in the next week, and with Pharmacy Clinic in 4-6 weeks.   Patient seen with Caffie PintoAkshara Kumar, PharmD Candidate.

## 2017-12-02 NOTE — Patient Instructions (Addendum)
It was good to see you today! Great work on your Raytheonweight and sugars!  1. Increase your Lantus to 36 units daily. Try to take it earlier in the day with your other Victoza, like around 11am. 2. Continue to take 1.8mg  of Victoza, and your other medications for your sugar. 3. Follow up with your doctor that manages your pain to evaluate the need for more medication. 4. Start using Nystatin powder, you can use that up to 4 times a day. 5. Follow up with Dr. Janee Mornhompson about your foot concerns in the next week.  Follow up with pharmacy in 4-6 weeks! Great work!

## 2017-12-03 ENCOUNTER — Encounter: Payer: Self-pay | Admitting: Pharmacist

## 2017-12-03 NOTE — Assessment & Plan Note (Signed)
Diabetes longstanding with improved control. Patient is able to verbalize appropriate hypoglycemia management plan. Patient is adherent with medication. -Increased dose of basal insulin Lantus (insulin glargine) from 30 to 36 units daily.  -Continued GLP-1 Victoza (generic name liraglutide) at 1.8mg  daily, Jardiance (empagliflozin) at 25mg  daily, and metformin 500mg  2 tab BID.  -Extensively discussed pathophysiology of DM, recommended lifestyle interventions, dietary effects on glycemic control -Counseled on s/sx of and management of hypoglycemia -Next A1C anticipated 01/25/2018.

## 2017-12-05 ENCOUNTER — Telehealth (INDEPENDENT_AMBULATORY_CARE_PROVIDER_SITE_OTHER): Payer: Self-pay

## 2017-12-05 ENCOUNTER — Ambulatory Visit (INDEPENDENT_AMBULATORY_CARE_PROVIDER_SITE_OTHER): Payer: Medicaid Other | Admitting: Orthopaedic Surgery

## 2017-12-05 ENCOUNTER — Encounter (INDEPENDENT_AMBULATORY_CARE_PROVIDER_SITE_OTHER): Payer: Self-pay | Admitting: Orthopaedic Surgery

## 2017-12-05 DIAGNOSIS — M1711 Unilateral primary osteoarthritis, right knee: Secondary | ICD-10-CM | POA: Insufficient documentation

## 2017-12-05 DIAGNOSIS — Z6841 Body Mass Index (BMI) 40.0 and over, adult: Secondary | ICD-10-CM | POA: Diagnosis not present

## 2017-12-05 MED ORDER — TRAMADOL HCL 50 MG PO TABS: ORAL_TABLET | ORAL | 0 refills | Status: DC

## 2017-12-05 NOTE — Telephone Encounter (Signed)
Noted. Will submit for [employer] Patient Assistance for Monovisc, right knee.

## 2017-12-05 NOTE — Telephone Encounter (Signed)
Please submit gel inj for patient RIGHT KNEE DR Roda ShuttersXU

## 2017-12-05 NOTE — Telephone Encounter (Signed)
Patient completed [employer] Patient Assistance application while in the office today.  Faxed completed application to [employer] at [phone].

## 2017-12-05 NOTE — Progress Notes (Signed)
Office Visit Note   Patient: Rita Bass           Date of Birth: May 11, 1965           MRN: [id number] Visit Date: 12/05/2017              Requested by: Garnette Gunner, MD [address] [place], [place] [address] PCP: Garnette Gunner, MD   Assessment & Plan: Visit Diagnoses:  1. Unilateral primary osteoarthritis, right knee   2. Morbid obesity (HCC)   3. Body mass index 50.0-59.9, adult (HCC)     Plan: Impression is right knee osteoarthritis.  Visit patient only had temporary relief from her previous cortisone injection, I do not propose repeating this.  We have talked about Visco supplementation injection however I do not think her insurance will approve.  She will fill out paperwork for [employer] today to see if they will approve her.  In the meantime, she will make all efforts at weight loss as her current BMI is 57.  She will need to lose 100 pounds to get to 230 pounds which will give her a BMI under 40 before we can proceed with total knee replacement.  She agrees and understands.  She will follow-up with [name] as needed. The patient meets the AMA guidelines for Morbid (severe) obesity with a BMI > 40.0 and I have recommended weight loss. Total face to face encounter time was greater than 25 minutes and over half of this time was spent in counseling and/or coordination of care.   Follow-Up Instructions: Return if symptoms worsen or fail to improve.   Orders:  No orders of the defined types were placed in this encounter.  Meds ordered this encounter  Medications  . traMADol (ULTRAM) 50 MG tablet    Sig: TAKE 1-2 TABS PO Q6-8 HOURS PRN PAIN    Dispense:  30 tablet    Refill:  0      Procedures: No procedures performed   Clinical Data: No additional findings.   Subjective: Chief Complaint  Patient presents with  . Right Knee - Pain    HPI patient is a 52 year old female who presents to our clinic today for recurrent right knee pain.  This has  been ongoing for several months and is progressively worsened.  She was seen by [name] in April of this past year where we injected her knee with cortisone.  This significantly helped but only lasted for a few days.  Her pain has returned and is started to worsen.  She has recently started to walk more as she has been trying to lose weight.  She has lost 50 pounds.  Otherwise, no injury.  Pain she has to the entire knee.  She describes this as a constant ache worse with walking, going up and down stairs and getting out of her car.  She does have pain at night when trying to sleep.  No mechanical symptoms.  She has been taking Advil and Tylenol without relief of symptoms.  No previous viscosupplementation injection.  Review of Systems as detailed in HPI.  All others reviewed and are negative.   Objective: Vital Signs: There were no vitals taken for this visit.  Physical Exam well-developed well-nourished female no acute distress.  Alert and oriented x3.  Ortho Exam examination of the right knee reveals a trace effusion.  Range of motion 0 to 95 degrees.  Medial joint line tenderness.  Stable to valgus varus stress.  She  is neurovascular intact distally.  Specialty Comments:  No specialty comments available.  Imaging: No new imaging   PMFS History: Patient Active Problem List   Diagnosis Date Noted  . Unilateral primary osteoarthritis, right knee 12/05/2017  . Body mass index 50.0-59.9, adult (HCC) 12/05/2017  . Diastolic dysfunction with heart failure (HCC) 10/25/2017  . 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 Special screening for malignant neoplasms, colon 10/25/2017  . Abscess of skin 10/25/2017  . Insomnia 10/25/2017  . Diabetic nephropathy associated with type 2 diabetes mellitus (HCC) 10/25/2017  . Type 2 diabetes mellitus with complication (HCC) 09/13/2017  . Dyspnea on exertion 08/16/2017  . At risk for acute ischemic cardiac event 08/16/2017  . Sleep apnea 08/16/2017  . Gastroesophageal  reflux disease 08/16/2017  . Chronic pain of right knee 07/13/2016  . Acute bronchitis 02/04/2015  . Morbid obesity (HCC) 02/04/2015  . Sinus tachycardia 02/04/2015  . Menorrhagia with regular cycle 11/18/2014   Past Medical History:  Diagnosis Date  . Arthritis   . Asthma   . Chronic pain of left knee   . Complication of anesthesia   . GERD (gastroesophageal reflux disease)   . H/O bronchitis   . Kidney stones   . Obesity   . Renal disorder   . Shortness of breath dyspnea   . Sleep apnea in adult    Polysomnogram pending.  Followed by Dr. Craige CottaSood    Family History  Problem Relation Age of Onset  . Breast cancer Mother   . Other Father        committed suicide  . Heart attack Maternal Aunt     Past Surgical History:  Procedure Laterality Date  . CESAREAN SECTION     6 c-sections  . CHOLECYSTECTOMY    . HERNIA REPAIR    . KIDNEY STONE SURGERY    . OOPHORECTOMY    . TRANSTHORACIC ECHOCARDIOGRAM  09/2017   Technically difficult study.  Did not use Definity contrast.  EF was 55 to 60% with moderate LVH and grade 1 diastolic dysfunction.  No significant valvular lesions noted.  No regional wall motion normality but difficult to assess due to poor imaging.   . TUBAL LIGATION     Social History   Occupational History  . Not on file  Tobacco Use  . Smoking status: Never Smoker  . Smokeless tobacco: Never Used  Substance and Sexual Activity  . Alcohol use: No  . Drug use: No  . Sexual activity: Not Currently

## 2017-12-10 ENCOUNTER — Other Ambulatory Visit: Payer: Self-pay

## 2017-12-10 MED ORDER — NYSTATIN 100000 UNIT/GM EX POWD: Freq: Four times a day (QID) | CUTANEOUS | 0 refills | Status: DC | PRN

## 2017-12-16 ENCOUNTER — Other Ambulatory Visit: Payer: Self-pay

## 2017-12-16 ENCOUNTER — Other Ambulatory Visit: Payer: Self-pay | Admitting: Family Medicine

## 2017-12-16 ENCOUNTER — Ambulatory Visit: Payer: Medicaid Other | Admitting: Family Medicine

## 2017-12-16 ENCOUNTER — Encounter: Payer: Self-pay | Admitting: Family Medicine

## 2017-12-16 VITALS — BP 128/84 | HR 88 | Temp 98.0°F | Ht 64.0 in | Wt 328.0 lb

## 2017-12-16 DIAGNOSIS — K219 Gastro-esophageal reflux disease without esophagitis: Secondary | ICD-10-CM | POA: Diagnosis not present

## 2017-12-16 DIAGNOSIS — M25561 Pain in right knee: Secondary | ICD-10-CM

## 2017-12-16 DIAGNOSIS — M1711 Unilateral primary osteoarthritis, right knee: Secondary | ICD-10-CM

## 2017-12-16 DIAGNOSIS — G8929 Other chronic pain: Secondary | ICD-10-CM

## 2017-12-16 DIAGNOSIS — Z760 Encounter for issue of repeat prescription: Secondary | ICD-10-CM

## 2017-12-16 DIAGNOSIS — E118 Type 2 diabetes mellitus with unspecified complications: Secondary | ICD-10-CM

## 2017-12-16 DIAGNOSIS — Z23 Encounter for immunization: Secondary | ICD-10-CM

## 2017-12-16 MED ORDER — DICLOFENAC SODIUM 75 MG PO TBEC: 75.0000 mg | DELAYED_RELEASE_TABLET | Freq: Two times a day (BID) | ORAL | 0 refills | Status: DC

## 2017-12-16 MED ORDER — ACETAMINOPHEN ER 650 MG PO TBCR: 650.0000 mg | EXTENDED_RELEASE_TABLET | Freq: Three times a day (TID) | ORAL | 3 refills | Status: DC | PRN

## 2017-12-16 MED ORDER — DULOXETINE HCL 20 MG PO CPEP: 40.0000 mg | ORAL_CAPSULE | Freq: Every day | ORAL | 3 refills | Status: DC

## 2017-12-16 NOTE — Patient Instructions (Addendum)
It was a pleasure to see you today! Thank you for choosing [facility] for your primary care. Sherrilyn Nairn was seen for right knee pain.   1. For your knee pain, we are starting the following medications.   Meds ordered this encounter  Medications  . diclofenac (VOLTAREN) 75 MG EC tablet    Sig: Take 1 tablet (75 mg total) by mouth 2 (two) times daily.    Dispense:  30 tablet    Refill:  0  . DULoxetine (CYMBALTA) 20 MG capsule    Sig: Take 2 capsules (40 mg total) by mouth daily.    Dispense:  30 capsule    Refill:  3  . acetaminophen (TYLENOL 8 HOUR) 650 MG CR tablet    Sig: Take 1 tablet (650 mg total) by mouth every 8 (eight) hours as needed for pain.    Dispense:  60 tablet    Refill:  3   We are also placing a referral to nutrition.  Orders Placed This Encounter  Procedures  . Amb ref to Medical Nutrition Therapy-MNT    Referral Priority:   Routine    Referral Type:   Consultation    Referral Reason:   Specialty Services Required    Requested Specialty:   Nutrition    Number of Visits Requested:   1    Best,  Thomes Dinning, MD, MS FAMILY MEDICINE RESIDENT - PGY2 12/16/2017 11:08 AM

## 2017-12-16 NOTE — Telephone Encounter (Signed)
Pt called and said she wasn't able to get her Diclofenac filled due to it needing to be signed off. [facility] on [address] says they have faxed it twice but we have not received it. Pt would like to be notified when it has been signed and sent back in so she can go pick it up. The best contact number is [phone].

## 2017-12-16 NOTE — Progress Notes (Signed)
Subjective:  Rita Bass is a 52 y.o. female who presents to the [facility] today with a chief complaint of right knee pain and need for medication refills.   HPI:  Right knee pain/right knee osteoarthritis Patient seen by [facility] orthopedics on 12/05/2017.  Patient not a candidate for knee replacement surgery due to morbid obesity.  Recommended patient lose weight until she is under BMI of 40.  Patient is actively interested in pursuing surgery, so she is motivated to lose weight.  She wants to see nutrition to help her with lifestyle modifications.  She says she would like to exercise but the pain in her right knee is limiting her ability to do so.  Her knee pain is not worsening.  However, she says that she does not have adequate control with her current medications.  Patient takes tramadol 50 mg every 8 hours as needed.  Patient has used diclofenac gel.  She says that diclofenac gel helps.  Patient has not tried any NSAIDs.  Patient does not take Tylenol.  Medication refills Medication needed are Prilosec and Jardiance.  ROS: Per HPI  PMH: Smoking history reviewed.    Objective:  Physical Exam: BP 128/84 (BP Location: Right Arm, Patient Position: Sitting, Cuff Size: Large)   Pulse 88   Temp 98 F (36.7 C) (Oral)   Ht 5\' 4"  (1.626 m)   Wt (!) 148.8 kg   SpO2 96%   BMI 56.30 kg/m   Gen: NAD, resting comfortably CV: RRR with no murmurs appreciated Pulm: NWOB, CTAB with no crackles, wheezes, or rhonchi MSK: Right knee stable, no effusion or tenderness.  Left knee normal  Neuro: grossly normal, moves all extremities Psych: Normal affect and thought content  No results found for this or any previous visit (from the past 72 hour(s)).   Assessment/Plan:  Unilateral primary osteoarthritis, right knee Inadequate pain management.  Not a surgical candidate at this time due to obesity.  Patient not trialed NSAIDs and does not take Tylenol.  Plan for multimodal, tiered  approach. -Tylenol 650 mg 3 times daily as needed -Diclofenac 700 mg twice daily as needed -Increase Cymbalta to 40 mg daily -Diclofenac gel 3 times daily as needed -Nutrition referral for assistance with weight loss -Follow-up in 1 month   Lab Orders  No laboratory test(s) ordered today    Meds ordered this encounter  Medications  . DISCONTD: diclofenac (VOLTAREN) 75 MG EC tablet    Sig: Take 1 tablet (75 mg total) by mouth 2 (two) times daily.    Dispense:  30 tablet    Refill:  0  . DULoxetine (CYMBALTA) 20 MG capsule    Sig: Take 2 capsules (40 mg total) by mouth daily.    Dispense:  30 capsule    Refill:  3  . acetaminophen (TYLENOL 8 HOUR) 650 MG CR tablet    Sig: Take 1 tablet (650 mg total) by mouth every 8 (eight) hours as needed for pain.    Dispense:  60 tablet    Refill:  3  . empagliflozin (JARDIANCE) 25 MG TABS tablet    Sig: Take 25 mg by mouth daily.    Dispense:  30 tablet    Refill:  6  . omeprazole (PRILOSEC) 40 MG capsule    Sig: Take 1 capsule (40 mg total) by mouth daily.    Dispense:  30 capsule    Refill:  3    Thomes Dinning, MD, MS FAMILY MEDICINE RESIDENT - PGY2 12/18/2017  11:17 AM

## 2017-12-17 DIAGNOSIS — G4733 Obstructive sleep apnea (adult) (pediatric): Secondary | ICD-10-CM | POA: Diagnosis not present

## 2017-12-17 MED ORDER — DICLOFENAC SODIUM 75 MG PO TBEC: 75.0000 mg | DELAYED_RELEASE_TABLET | Freq: Two times a day (BID) | ORAL | 0 refills | Status: DC

## 2017-12-18 DIAGNOSIS — Z760 Encounter for issue of repeat prescription: Secondary | ICD-10-CM | POA: Insufficient documentation

## 2017-12-18 MED ORDER — OMEPRAZOLE 40 MG PO CPDR: 40.0000 mg | DELAYED_RELEASE_CAPSULE | Freq: Every day | ORAL | 3 refills | Status: DC

## 2017-12-18 MED ORDER — EMPAGLIFLOZIN 25 MG PO TABS: 25.0000 mg | ORAL_TABLET | Freq: Every day | ORAL | 6 refills | Status: DC

## 2017-12-18 NOTE — Assessment & Plan Note (Signed)
Inadequate pain management.  Not a surgical candidate at this time due to obesity.  Patient not trialed NSAIDs and does not take Tylenol.  Plan for multimodal, tiered approach. -Tylenol 650 mg 3 times daily as needed -Diclofenac 700 mg twice daily as needed -Increase Cymbalta to 40 mg daily -Diclofenac gel 3 times daily as needed -Nutrition referral for assistance with weight loss -Follow-up in 1 month

## 2017-12-18 NOTE — Assessment & Plan Note (Signed)
Refill Jardiance and Prilosec

## 2017-12-19 ENCOUNTER — Ambulatory Visit: Payer: Medicaid Other | Admitting: Gastroenterology

## 2017-12-20 ENCOUNTER — Telehealth: Payer: Self-pay

## 2017-12-20 ENCOUNTER — Telehealth: Payer: Self-pay | Admitting: Family Medicine

## 2017-12-20 ENCOUNTER — Ambulatory Visit: Payer: Medicaid Other | Admitting: Podiatry

## 2017-12-20 NOTE — Telephone Encounter (Signed)
Patient left message that Diclofenac requires a PA. Form placed in PCP box for completion along with Medicaid formulary.  Ples Specter, RN [facility] RN)

## 2017-12-23 ENCOUNTER — Ambulatory Visit: Payer: Medicaid Other | Admitting: Cardiology

## 2017-12-23 NOTE — Telephone Encounter (Signed)
PA completed and placed in RN pool as requested.

## 2017-12-23 NOTE — Telephone Encounter (Signed)
Medication approved until 12/23/18.  Pharmacy informed. Brenton Joines, Maryjo Rochester, CMA

## 2017-12-26 ENCOUNTER — Ambulatory Visit: Payer: Medicaid Other | Admitting: Pharmacist

## 2017-12-26 ENCOUNTER — Encounter: Payer: Self-pay | Admitting: Pharmacist

## 2017-12-26 DIAGNOSIS — E118 Type 2 diabetes mellitus with unspecified complications: Secondary | ICD-10-CM | POA: Diagnosis not present

## 2017-12-26 MED ORDER — INSULIN GLARGINE 100 UNIT/ML SOLOSTAR PEN: 40.0000 [IU] | PEN_INJECTOR | Freq: Every day | SUBCUTANEOUS | 11 refills | Status: DC

## 2017-12-26 NOTE — Progress Notes (Signed)
Patient ID: Rita Bass, female   DOB: 04/03/1965, 52 y.o.   MRN: [id number] Reviewed: Agree with Dr. Koval's documentation and management. 

## 2017-12-26 NOTE — Patient Instructions (Addendum)
It was great to see you today!   Two things:  1) Increase Lantus to 40 units daily  2) Start checking 2 hours after your largest meal a few days a week. Our goal blood sugars are:  - Fasting (first thing in the morning): mostly less than 140 - 2 hours after a meal: mostly less than 180   Please give [name] a call if you start to see fasting blood sugars less than 80.    Follow up with Dr. Janee Morn as scheduled later this month, and bring your blood sugar readings to that appointment.

## 2017-12-26 NOTE — Assessment & Plan Note (Signed)
Diabetes longstanding currently uncontrolled, but improved from previously. Patient is adherent with medication. Control is suboptimal due to lack of physical activity, elevated weight and insulin resistance - Increase Lantus from 36 to 40 units daily.  - Continued metformin 1000 mg BID, Jardiance 25 mg daily, Victoza 1.8 mg daily - Counseled patient to begin to check 2 hour post prandial BG after the largest meal of her day to evaluate need for prandial insulin in the future. Moving forward, if post prandial BG elevated or A1c continues to be >8.5%, consider referring back to pharmacy clinic -Extensively discussed pathophysiology of DM, recommended lifestyle interventions, dietary effects on glycemic control -Counseled on s/sx of and management of hypoglycemia - Next A1c due 01/2018

## 2017-12-26 NOTE — Progress Notes (Signed)
S:     Chief Complaint  Patient presents with  . Medication Management    Diabetes    Patient arrives in good spirits, ambulating without assistance with her daughter.  Presents for diabetes evaluation, education, and management. Patient was referred by Dr. Sherrie George on 08/16/2017)..  Patient was last seen by Primary Care Provider on 12/02/2017 - at that time, he initiated acetaminophen and oral diclofenac sodium. She notes the diclofenac required a prior authorization; she just picked up this medication from the pharmacy and has not started it yet.   She is pleased with the progress with her blood sugars. She notes that she was worried about her BG increasing after meals, but Dr. Janee Morn assured her this was normal. She notes that she has not missed doses, just occasionally taken some medications later in the day if she was out of the house due to a busy schedule.   She notes that her breathing has been "ok overall", worse with exertion. She notes she is still working on getting the CPAP issue solved.   Insurance coverage/medication affordability: [place] Medicaid  Patient reports adherence with medications.  Current diabetes medications include: Lantus 36 units, metformin 2000 mg daily, Victoza 1.8 mg daily, Jardiance 25 mg daily Current hypertension medications include: lisinopril 10 mg daily  Patient denies hypoglycemic events.  Patient-reported exercise habits: Walking to the mailbox couple times daily, walking around [place] 1x/week   Patient reports nocturia and dry mouth/polydipsia. Also reports some drowsiness.  Patient reports mild but improved neuropathy. Patient denies visual changes. Patient reports self foot exams.   O:  Physical Exam  Constitutional: She appears well-developed and well-nourished.  Vitals reviewed.   Review of Systems  All other systems reviewed and are negative.    Lab Results  Component Value Date   HGBA1C 8.8 (A) 10/25/2017    Vitals:   12/26/17 1046  BP: 130/86  Pulse: 78  Resp: (!) 98    Lipid Panel     Component Value Date/Time   CHOL 171 08/16/2017 1540   TRIG 158 (H) 08/16/2017 1540   HDL 49 08/16/2017 1540   CHOLHDL 3.5 08/16/2017 1540   LDLCALC 90 08/16/2017 1540    Home fasting CBG: 130-150s, highest 160 1 hour post-prandial/random CBG: 190s  Clinical ASCVD: No  ASCVD risk factors : age 46-75 10 year ASCVD risk score: 3.1%  A/P: Diabetes longstanding currently uncontrolled, but improved from previously. Patient is adherent with medication. Control is suboptimal due to lack of physical activity, elevated weight and insulin resistance - Increase Lantus from 36 to 40 units daily.  - Continued metformin 1000 mg BID, Jardiance 25 mg daily, Victoza 1.8 mg daily - Counseled patient to begin to check 2 hour post prandial BG after the largest meal of her day to evaluate need for prandial insulin in the future. Moving forward, if post prandial BG elevated or A1c continues to be >8.5%, consider referring back to pharmacy clinic -Extensively discussed pathophysiology of DM, recommended lifestyle interventions, dietary effects on glycemic control -Counseled on s/sx of and management of hypoglycemia - Next A1c due 01/2018  ASCVD risk - primary prevention in patient with DM. Last LDL is controlled. ASCVD risk score is not >20%  - moderate intensity statin indicated. - Moving forward, consider initiation of moderate intensity statin therapy  Hypertension longstanding currently controlled.  BP goal <140/90 mmHg, though a more stringent goal of <130/80 is appropriate, as patient is tolerating this w/o s/sx hypotension - Continue lisinopril 10  mg daily  Written patient instructions provided.  Total time in face to face counseling 30 minutes.   Follow up PCP Clinic Visit in 2 weeks, Pharmacy Clinic PRN. Patient seen with Belva Agee, PharmD Candidate and Catie Feliz Beam, PharmD,  PGY2 Pharmacy Resident.

## 2017-12-30 ENCOUNTER — Ambulatory Visit: Payer: Medicaid Other | Admitting: Pharmacist

## 2018-01-02 ENCOUNTER — Telehealth (INDEPENDENT_AMBULATORY_CARE_PROVIDER_SITE_OTHER): Payer: Self-pay

## 2018-01-02 NOTE — Telephone Encounter (Signed)
Faxed PRF to [employer] at [phone] for Monovisc,right knee.

## 2018-01-08 NOTE — Telephone Encounter (Signed)
finished

## 2018-01-13 ENCOUNTER — Ambulatory Visit: Payer: Medicaid Other | Admitting: Family Medicine

## 2018-01-14 ENCOUNTER — Ambulatory Visit: Payer: Medicaid Other | Admitting: Pulmonary Disease

## 2018-01-17 DIAGNOSIS — G4733 Obstructive sleep apnea (adult) (pediatric): Secondary | ICD-10-CM | POA: Diagnosis not present

## 2018-01-23 ENCOUNTER — Ambulatory Visit: Payer: Medicaid Other | Admitting: Gastroenterology

## 2018-01-24 ENCOUNTER — Telehealth: Payer: Self-pay

## 2018-01-24 NOTE — Telephone Encounter (Signed)
Patient left message that PCP treating her for leg pain. States left leg is worse today and is asking for a prescription for a muscle relaxant to see if that would be helpful.  Call back is [phone]  Ples Specter, RN [facility] RN)

## 2018-01-27 ENCOUNTER — Ambulatory Visit: Payer: Medicaid Other | Admitting: Cardiology

## 2018-01-27 ENCOUNTER — Ambulatory Visit: Payer: Medicaid Other | Admitting: Dietician
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
## 2018-01-28 ENCOUNTER — Other Ambulatory Visit (INDEPENDENT_AMBULATORY_CARE_PROVIDER_SITE_OTHER): Payer: Self-pay | Admitting: Orthopaedic Surgery

## 2018-01-28 NOTE — Telephone Encounter (Signed)
Patient called needing Rx refilled (Tramdol) The number to contact patient is [phone]

## 2018-01-29 NOTE — Telephone Encounter (Signed)
Called pt. No answer. Left VM to call back. If pt calls back, she can be informed that I requested a follow up in one month for leg pain. Also, cannot Rx muscle relaxer without seeing patient as this would be poor care and against clinic policy.

## 2018-01-30 ENCOUNTER — Encounter: Payer: Self-pay | Admitting: Cardiology

## 2018-01-30 NOTE — Telephone Encounter (Signed)
30

## 2018-01-30 NOTE — Telephone Encounter (Signed)
PLEASE ADVISE.

## 2018-01-31 MED ORDER — TRAMADOL HCL 50 MG PO TABS: ORAL_TABLET | ORAL | 0 refills | Status: DC

## 2018-01-31 NOTE — Telephone Encounter (Signed)
Called to pharmacy. Patient advised.  

## 2018-02-10 ENCOUNTER — Other Ambulatory Visit: Payer: Self-pay

## 2018-02-10 ENCOUNTER — Ambulatory Visit (INDEPENDENT_AMBULATORY_CARE_PROVIDER_SITE_OTHER): Payer: Medicaid Other | Admitting: Family Medicine

## 2018-02-10 ENCOUNTER — Encounter: Payer: Self-pay | Admitting: Family Medicine

## 2018-02-10 VITALS — BP 104/70 | HR 90 | Temp 97.8°F | Wt 324.0 lb

## 2018-02-10 DIAGNOSIS — G8929 Other chronic pain: Secondary | ICD-10-CM

## 2018-02-10 DIAGNOSIS — J984 Other disorders of lung: Secondary | ICD-10-CM

## 2018-02-10 DIAGNOSIS — M25561 Pain in right knee: Secondary | ICD-10-CM

## 2018-02-10 DIAGNOSIS — E118 Type 2 diabetes mellitus with unspecified complications: Secondary | ICD-10-CM

## 2018-02-10 LAB — POCT GLYCOSYLATED HEMOGLOBIN (HGB A1C): HBA1C, POC (CONTROLLED DIABETIC RANGE): 7.4 % — AB (ref 0.0–7.0)

## 2018-02-10 MED ORDER — MOMETASONE FURO-FORMOTEROL FUM 100-5 MCG/ACT IN AERO: 2.0000 | INHALATION_SPRAY | Freq: Every day | RESPIRATORY_TRACT | 12 refills | Status: DC

## 2018-02-10 MED ORDER — TRAMADOL HCL 50 MG PO TABS: ORAL_TABLET | ORAL | 0 refills | Status: DC

## 2018-02-10 MED ORDER — DULOXETINE HCL 60 MG PO CPEP: 60.0000 mg | ORAL_CAPSULE | Freq: Every day | ORAL | 3 refills | Status: DC

## 2018-02-10 NOTE — Assessment & Plan Note (Signed)
Patient interested in weight loss.  She has referral with nutrition she plans to try to follow-up with them.  Also considering bariatric surgery.  Discussed the benefits of potential associated with bariatric surgery including improvement in OSA, weight loss, OA pain, improved longevity of life, decreased risk of cancers etc. -Referral placed to bariatric surgery

## 2018-02-10 NOTE — Progress Notes (Signed)
error 

## 2018-02-10 NOTE — Assessment & Plan Note (Signed)
A1c control improved.  Continue current regimen.  Encourage exercise and follow-up with with nutrition

## 2018-02-10 NOTE — Patient Instructions (Addendum)
It was a pleasure to see you today! Thank you for choosing [facility] for your primary care. Rita HockingDebbie Mittelman was seen for right knee pain.   For your right knee pain, we are increasing her duloxetine and continuing her tramadol.    Please be sure to follow-up with nutrition for weight loss.  We are referring you to physical therapy and chiropractics for your knee pain as well.  We are also referring you to have a discussion for bariatric surgery.  Your A1c is gone down to 7.4.  Continue with the good work as we go into the holiday season.   Best,  Thomes DinningBrad Thompson, MD, MS FAMILY MEDICINE RESIDENT - PGY2 02/10/2018 2:05 PM
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
## 2018-02-10 NOTE — Assessment & Plan Note (Signed)
Chronic right knee pain due to osteoarthritis.  Patient having inadequate pain control with current regimen including Tylenol, diclofenac pill, diclofenac gel, Cymbalta,, tramadol. -Increase Cymbalta to 60 mg daily -Represcribed tramadol 50 mg -Referral to PT, chiropractics -Follow-up in 1 month

## 2018-02-16 DIAGNOSIS — G4733 Obstructive sleep apnea (adult) (pediatric): Secondary | ICD-10-CM | POA: Diagnosis not present

## 2018-02-17 ENCOUNTER — Telehealth: Payer: Self-pay | Admitting: *Deleted

## 2018-02-17 NOTE — Telephone Encounter (Signed)
Pt calls to let us know that the Pharmacy never received the tramadol.  Per chart it was called in.  Called pharmmacy to verify, med was never called in.  Verbally called in today. Chrisha Vogel, Maryjo RochesterJessica Dawn, CMA

## 2018-02-20 ENCOUNTER — Ambulatory Visit: Payer: Medicaid Other | Admitting: Pharmacist

## 2018-02-24 ENCOUNTER — Ambulatory Visit: Payer: Medicaid Other | Admitting: Gastroenterology

## 2018-02-26 ENCOUNTER — Telehealth (INDEPENDENT_AMBULATORY_CARE_PROVIDER_SITE_OTHER): Payer: Self-pay

## 2018-02-26 NOTE — Telephone Encounter (Signed)
Received PRF form from [employer] to be completed.  Faxed completed PRF form to [employer] at [phone].

## 2018-02-27 ENCOUNTER — Ambulatory Visit: Payer: Medicaid Other | Admitting: Pharmacist

## 2018-03-05 ENCOUNTER — Ambulatory Visit: Payer: Medicaid Other | Admitting: Podiatry

## 2018-03-10 ENCOUNTER — Other Ambulatory Visit: Payer: Self-pay

## 2018-03-10 ENCOUNTER — Encounter: Payer: Self-pay | Admitting: Family Medicine

## 2018-03-10 ENCOUNTER — Ambulatory Visit: Payer: Medicaid Other | Admitting: Family Medicine

## 2018-03-10 ENCOUNTER — Telehealth (INDEPENDENT_AMBULATORY_CARE_PROVIDER_SITE_OTHER): Payer: Self-pay

## 2018-03-10 DIAGNOSIS — G8929 Other chronic pain: Secondary | ICD-10-CM | POA: Diagnosis not present

## 2018-03-10 DIAGNOSIS — E118 Type 2 diabetes mellitus with unspecified complications: Secondary | ICD-10-CM

## 2018-03-10 DIAGNOSIS — M25561 Pain in right knee: Secondary | ICD-10-CM

## 2018-03-10 MED ORDER — TRAMADOL HCL 50 MG PO TABS: 100.0000 mg | ORAL_TABLET | Freq: Four times a day (QID) | ORAL | 0 refills | Status: DC | PRN

## 2018-03-10 MED ORDER — DICLOFENAC SODIUM 75 MG PO TBEC: 75.0000 mg | DELAYED_RELEASE_TABLET | Freq: Two times a day (BID) | ORAL | 0 refills | Status: DC

## 2018-03-10 MED ORDER — DICLOFENAC SODIUM 1 % TD GEL: 4.0000 g | Freq: Four times a day (QID) | TRANSDERMAL | 3 refills | Status: DC

## 2018-03-10 MED ORDER — DULOXETINE HCL 60 MG PO CPEP: 120.0000 mg | ORAL_CAPSULE | Freq: Every day | ORAL | 3 refills | Status: DC

## 2018-03-10 MED ORDER — INSULIN GLARGINE 100 UNIT/ML SOLOSTAR PEN: 50.0000 [IU] | PEN_INJECTOR | Freq: Every day | SUBCUTANEOUS | 11 refills | Status: DC

## 2018-03-10 NOTE — Progress Notes (Signed)
Established Patient Office Visit  Subjective:  Patient ID: Rita HockingDebbie Bass, female    DOB: 25-Sep-1965  Age: 52 y.o. MRN: [id number]  CC:  Chief Complaint  Patient presents with  . Diabetes    HPI Rita HockingDebbie Bass presents for T2DM and ongoing knee pain due to OA   T2DM, improved, but still uncontrolled Last A1C in 7s in Nov 2019. Pt states morning cbgs are > 130 4 days a week. Denies any hypoglycemia symptoms. Pt is taking maximum dose metformin, jardiance, victoza. She is also using 40 units of insulin lantus. She has lost 3 lbs since her last visit. She is having difficulty remaining active due to knee pain. Patient is on lisinopril for renal protection. Pt is not on statin medication. Will need to readdress at next visit.   Bilateral knee OA, R>L, improved, but still uncontrolled Pt is seen by orthopedics where she is planning to get ongoing "non-steroid" injections. She is also on cymbalta 60 mg, voltaren 75 mg bid, voltaren gel, and tramadol 50 mg 6-8 hr prn. Pt states that tramadol has helped some, but that pt still has significant pain. She feels that she would be more active if she had better pain control.   No Known Allergies  ROS Review of Systems  Constitutional: Negative for fever.  HENT: Negative.   All other systems reviewed and are negative.     Objective:    Physical Exam  Constitutional: She appears well-developed. No distress.  HENT:  Head: Normocephalic and atraumatic.  Neck: No JVD present.  Cardiovascular: Normal rate and regular rhythm.  Pulmonary/Chest: Effort normal and breath sounds normal.  Abdominal: Soft. She exhibits no distension.  Musculoskeletal: Normal range of motion.     Right knee: No tenderness found.     Left knee: Normal. No tenderness found.  Neurological: She is alert.  Skin: Skin is warm and dry.  Psychiatric: She has a normal mood and affect. Her behavior is normal.    BP 136/82   Pulse 94   Temp 98.1 F (36.7 C) (Oral)    Ht 5\' 4"  (1.626 m)   Wt (!) 321 lb (145.6 kg)   SpO2 95%   BMI 55.10 kg/m  Wt Readings from Last 3 Encounters:  03/10/18 (!) 321 lb (145.6 kg)  02/10/18 (!) 324 lb (147 kg)  12/26/17 (!) 331 lb 9.6 oz (150.4 kg)     Health Maintenance Due  Topic Date Due  . OPHTHALMOLOGY EXAM  10/15/1977  . COLONOSCOPY  10/15/2017  . PAP SMEAR-Modifier  01/19/2018      Assessment & Plan:   Problem List Items Addressed This Visit      Endocrine   Type 2 diabetes mellitus with complication (HCC)   Relevant Medications   Insulin Glargine (LANTUS) 100 UNIT/ML Solostar Pen     Other   Chronic pain of right knee   Relevant Medications   diclofenac (VOLTAREN) 75 MG EC tablet   diclofenac sodium (VOLTAREN) 1 % GEL   traMADol (ULTRAM) 50 MG tablet   DULoxetine (CYMBALTA) 60 MG capsule      Meds ordered this encounter  Medications  . Insulin Glargine (LANTUS) 100 UNIT/ML Solostar Pen    Sig: Inject 50 Units into the skin daily. Take after largest meal.    Dispense:  15 mL    Refill:  11  . diclofenac (VOLTAREN) 75 MG EC tablet    Sig: Take 1 tablet (75 mg total) by mouth 2 (two) times daily.  Dispense:  30 tablet    Refill:  0  . diclofenac sodium (VOLTAREN) 1 % GEL    Sig: Apply 4 g topically 4 (four) times daily.    Dispense:  1 Tube    Refill:  3  . traMADol (ULTRAM) 50 MG tablet    Sig: Take 2 tablets (100 mg total) by mouth every 6 (six) hours as needed for moderate pain.    Dispense:  30 tablet    Refill:  0  . DULoxetine (CYMBALTA) 60 MG capsule    Sig: Take 2 capsules (120 mg total) by mouth daily.    Dispense:  30 capsule    Refill:  3    Follow-up: No follow-ups on file.    Garnette GunnerAaron B Dvon Jiles, MD

## 2018-03-10 NOTE — Patient Instructions (Signed)
It was a pleasure to see you today! Thank you for choosing [facility] for your primary care. Pervis HockingDebbie Purifoy was seen for knee pain.    Listed below are the meds we refilled or changed. Please continue to keep a log of your CBGs and follow up with me in 1 month for knee pain.   Meds ordered this encounter  Medications  . Insulin Glargine (LANTUS) 100 UNIT/ML Solostar Pen    Sig: Inject 50 Units into the skin daily. Take after largest meal.    Dispense:  15 mL    Refill:  11  . diclofenac (VOLTAREN) 75 MG EC tablet    Sig: Take 1 tablet (75 mg total) by mouth 2 (two) times daily.    Dispense:  30 tablet    Refill:  0  . diclofenac sodium (VOLTAREN) 1 % GEL    Sig: Apply 4 g topically 4 (four) times daily.    Dispense:  1 Tube    Refill:  3  . traMADol (ULTRAM) 50 MG tablet    Sig: Take 2 tablets (100 mg total) by mouth every 6 (six) hours as needed for moderate pain.    Dispense:  30 tablet    Refill:  0  . DULoxetine (CYMBALTA) 60 MG capsule    Sig: Take 2 capsules (120 mg total) by mouth daily.    Dispense:  30 capsule    Refill:  3      Best,  Thomes DinningBrad Thompson, MD, MS FAMILY MEDICINE RESIDENT - PGY12/23/2019 1:57 PM

## 2018-03-10 NOTE — Telephone Encounter (Signed)
Called and left a VM advising patient to call back and schedule an appointment for Monovisc gel injection, right knee with Dr. Roda ShuttersXu.

## 2018-03-14 ENCOUNTER — Telehealth: Payer: Self-pay

## 2018-03-14 NOTE — Telephone Encounter (Signed)
Pt called nurse line stating she went to [facility] [address] to pick up prescriptions, and the tramadol was never received by them. I called pharmacy to confirm. I spoke with pharmacist and phone in Tramadol 50mg  #30 with no refills.

## 2018-03-18 ENCOUNTER — Ambulatory Visit: Payer: Medicaid Other | Admitting: Podiatry

## 2018-03-19 DIAGNOSIS — G4733 Obstructive sleep apnea (adult) (pediatric): Secondary | ICD-10-CM | POA: Diagnosis not present

## 2018-03-27 ENCOUNTER — Other Ambulatory Visit: Payer: Self-pay

## 2018-03-27 ENCOUNTER — Telehealth: Payer: Self-pay | Admitting: Pharmacist

## 2018-03-27 ENCOUNTER — Ambulatory Visit: Payer: Medicaid Other | Admitting: Pharmacist

## 2018-03-27 NOTE — Telephone Encounter (Signed)
Contacted patient to follow up on blood sugar control/increased duloxetine dose, as we had to reschedule appointment with Dr. Raymondo Band today. Left HIPAA compliant message for patient to return my call at her convenience.   Catie Feliz Beam, PharmD PGY2 Ambulatory Care Pharmacy Resident, [facility] Phone: [phone]

## 2018-03-27 NOTE — Telephone Encounter (Signed)
Received call back from patient. She reports fasting BG typically 120-130s. Denies hypoglycemia. She confirms dosing of antihyperglycemics that she is taking. Denies intolerability concerns with her current regimen. She was pleased with her most recent A1c of 7.4%.  However, she notes that she is still having problems with pain. She has not noticed a difference since increasing duloxetine ~1.5 weeks ago. Continues to endorse pain in her legs that occasionally impacts sleep, and notes that she has recently developed pain in her elbows. We discussed that SNRIs often take time to take full effect after initiation or dose changes. We'll re-evaluate the effect of this regimen at upcoming appointments (pharmacy clinic on 1/20, PCP on 1/28).   She also asked that the Nystop powder be refilled and sent to her preferred [facility].   Will route to RN team.   Caroline More, PharmD PGY2 Ambulatory Care Pharmacy Resident, [facility] Phone: [phone]

## 2018-03-28 MED ORDER — NYSTATIN 100000 UNIT/GM EX POWD: Freq: Four times a day (QID) | CUTANEOUS | 0 refills | Status: DC | PRN

## 2018-04-01 ENCOUNTER — Ambulatory Visit: Payer: Medicaid Other | Admitting: Podiatry

## 2018-04-07 ENCOUNTER — Ambulatory Visit: Payer: Medicaid Other | Admitting: Pharmacist

## 2018-04-15 ENCOUNTER — Ambulatory Visit: Payer: Medicaid Other | Admitting: Family Medicine

## 2018-04-17 ENCOUNTER — Ambulatory Visit: Payer: Medicaid Other | Admitting: Pharmacist

## 2018-04-19 DIAGNOSIS — G4733 Obstructive sleep apnea (adult) (pediatric): Secondary | ICD-10-CM | POA: Diagnosis not present

## 2018-04-28 ENCOUNTER — Other Ambulatory Visit: Payer: Self-pay

## 2018-04-28 ENCOUNTER — Ambulatory Visit: Payer: Medicaid Other | Admitting: Family Medicine

## 2018-04-28 VITALS — BP 128/70 | HR 89 | Temp 97.6°F | Wt 320.0 lb

## 2018-04-28 DIAGNOSIS — B9789 Other viral agents as the cause of diseases classified elsewhere: Secondary | ICD-10-CM

## 2018-04-28 DIAGNOSIS — J069 Acute upper respiratory infection, unspecified: Secondary | ICD-10-CM | POA: Diagnosis not present

## 2018-04-28 MED ORDER — BENZONATATE 100 MG PO CAPS: 100.0000 mg | ORAL_CAPSULE | Freq: Two times a day (BID) | ORAL | 0 refills | Status: DC | PRN

## 2018-04-28 MED ORDER — ALBUTEROL SULFATE HFA 108 (90 BASE) MCG/ACT IN AERS: 1.0000 | INHALATION_SPRAY | Freq: Four times a day (QID) | RESPIRATORY_TRACT | 1 refills | Status: DC | PRN

## 2018-04-28 NOTE — Progress Notes (Signed)
    Subjective:  Rita Bass is a 53 y.o. female who presents to the [facility] today with a chief complaint of cough.   HPI:  Cough, congestion, wheezing for the past 3 days. No sputum production. No rhinorrhea. Very congested which is giving her a headache. No fever or chills.  No vomiting or diarrhea.  The were symptom for her is the coughing so she would like medication for this. She does not have a sore throat. She does not feel short of breath.  Has been using her Dulera and Spiriva for her wheezing.  She has not needed any albuterol but she also has not been taking it because she was told not to take anymore since on [facility] She has been taking DayQuil without much relief.  ROS: Per HPI  PMH: COPD, asthma on spiriva and dulera  Objective:  Physical Exam: BP 128/70   Pulse 89   Temp 97.6 F (36.4 C) (Oral)   Wt (!) 320 lb (145.2 kg)   SpO2 94%   BMI 54.93 kg/m   Gen: NAD, resting comfortably.  Obese HEENT: [place], AT. Nasal mucosa boggy bilaterally.  TMs pearly bilaterally.  Oropharynx slightly erythematous without exudates or edema. Neck: No cervical lymphadenopathy CV: RRR with no murmurs appreciated Pulm: NWOB, CTAB with no crackles, wheezes, or rhonchi GI: Normal bowel sounds present. Soft, Nontender, Nondistended. Skin: warm, dry Neuro: grossly normal, moves all extremities Psych: Normal affect and thought content   Assessment/Plan:  1. Viral URI with cough Patient is here with 3 days of cough and congestion with wheezing.  Her lungs are clear on exam today and she is afebrile and well-appearing.  Recommended supportive care at home with good p.o. hydration, over-the-counter any nasal decongestions, and plenty of rest.  Given for cough medication. Since she does have COPD/asthma but is compliant on her controller medications, no concern for pneumonia or current exacerbation.  Rx given for albuterol as needed. - albuterol (PROVENTIL HFA;VENTOLIN HFA) 108 (90 Base) MCG/ACT  inhaler; Inhale 1-2 puffs into the lungs every 6 (six) hours as needed for wheezing or shortness of breath.  Dispense: 1 Inhaler; Refill: 1 - benzonatate (TESSALON) 100 MG capsule; Take 1 capsule (100 mg total) by mouth 2 (two) times daily as needed for cough.  Dispense: 20 capsule; Refill: 0   Leland Her, DO PGY-3, [facility] 04/28/2018 10:23 AM

## 2018-04-28 NOTE — Patient Instructions (Signed)
Viral Respiratory Infection  A viral respiratory infection is an illness that affects parts of the body that are used for breathing. These include the lungs, nose, and throat. It is caused by a germ called a virus.  Some examples of this kind of infection are:  · A cold.  · The flu (influenza).  · A respiratory syncytial virus (RSV) infection.  A person who gets this illness may have the following symptoms:  · A stuffy or runny nose.  · Yellow or green fluid in the nose.  · A cough.  · Sneezing.  · Tiredness (fatigue).  · Achy muscles.  · A sore throat.  · Sweating or chills.  · A fever.  · A headache.  Follow these instructions at home:  Managing pain and congestion  · Take over-the-counter and prescription medicines only as told by your doctor.  · If you have a sore throat, gargle with salt water. Do this 3-4 times per day or as needed. To make a salt-water mixture, dissolve ½-1 tsp of salt in 1 cup of warm water. Make sure that all the salt dissolves.  · Use nose drops made from salt water. This helps with stuffiness (congestion). It also helps soften the skin around your nose.  · Drink enough fluid to keep your pee (urine) pale yellow.  General instructions    · Rest as much as possible.  · Do not drink alcohol.  · Do not use any products that have nicotine or tobacco, such as cigarettes and e-cigarettes. If you need help quitting, ask your doctor.  · Keep all follow-up visits as told by your doctor. This is important.  How is this prevented?    · Get a flu shot every year. Ask your doctor when you should get your flu shot.  · Do not let other people get your germs. If you are sick:  ? Stay home from work or school.  ? Wash your hands with soap and water often. Wash your hands after you cough or sneeze. If soap and water are not available, use hand sanitizer.  · Avoid contact with people who are sick during cold and flu season. This is in fall and winter.  Get help if:  · Your symptoms last for 10 days or  longer.  · Your symptoms get worse over time.  · You have a fever.  · You have very bad pain in your face or forehead.  · Parts of your jaw or neck become very swollen.  Get help right away if:  · You feel pain or pressure in your chest.  · You have shortness of breath.  · You faint or feel like you will faint.  · You keep throwing up (vomiting).  · You feel confused.  Summary  · A viral respiratory infection is an illness that affects parts of the body that are used for breathing.  · Examples of this illness include a cold, the flu, and respiratory syncytial virus (RSV) infection.  · The infection can cause a runny nose, cough, sneezing, sore throat, and fever.  · Follow what your doctor tells you about taking medicines, drinking lots of fluid, washing your hands, resting at home, and avoiding people who are sick.  This information is not intended to replace advice given to you by your health care provider. Make sure you discuss any questions you have with your health care provider.  Document Released: 02/16/2008 Document Revised: 04/15/2017 Document Reviewed: 04/15/2017  Elsevier   Interactive Patient Education © 2019 Elsevier Inc.

## 2018-05-01 ENCOUNTER — Ambulatory Visit: Payer: Medicaid Other | Admitting: Pharmacist

## 2018-05-08 ENCOUNTER — Ambulatory Visit: Payer: Medicaid Other | Admitting: Pharmacist

## 2018-05-08 ENCOUNTER — Ambulatory Visit: Payer: Medicaid Other | Admitting: Family Medicine

## 2018-05-15 ENCOUNTER — Ambulatory Visit: Payer: Medicaid Other | Admitting: Pharmacist

## 2018-05-18 DIAGNOSIS — G4733 Obstructive sleep apnea (adult) (pediatric): Secondary | ICD-10-CM | POA: Diagnosis not present

## 2018-05-22 ENCOUNTER — Ambulatory Visit: Payer: Medicaid Other | Admitting: Family Medicine

## 2018-05-29 ENCOUNTER — Encounter: Payer: Self-pay | Admitting: Pharmacist

## 2018-05-29 ENCOUNTER — Other Ambulatory Visit: Payer: Self-pay

## 2018-05-29 ENCOUNTER — Ambulatory Visit: Payer: Medicaid Other | Admitting: Pharmacist

## 2018-05-29 VITALS — BP 102/68 | HR 88 | Ht 63.0 in | Wt 324.8 lb

## 2018-05-29 DIAGNOSIS — E118 Type 2 diabetes mellitus with unspecified complications: Secondary | ICD-10-CM | POA: Diagnosis not present

## 2018-05-29 DIAGNOSIS — Z9189 Other specified personal risk factors, not elsewhere classified: Secondary | ICD-10-CM | POA: Diagnosis not present

## 2018-05-29 MED ORDER — INSULIN GLARGINE 100 UNIT/ML SOLOSTAR PEN: 44.0000 [IU] | PEN_INJECTOR | Freq: Every day | SUBCUTANEOUS | 11 refills | Status: DC

## 2018-05-29 NOTE — Assessment & Plan Note (Signed)
Diabetes controlled with current regimen. Patient is adherent with medication. -Decreased dose of Lantus 50 units daily to 44 units daily with current reported blood sugars within goal. Patient instructed to increase to 46 units daily if fasting blood glucose is >130. -Continue metformin 1000 mg BID, Victoza 1.8 mg daily, and Jardiance 25 mg daily -Next A1C today

## 2018-05-29 NOTE — Progress Notes (Addendum)
S:     Chief Complaint  Patient presents with  . Medication Management    Diabetes, Weight Loss    Patient arrives in good spirits and is ambulating without assistance.  Presents for diabetes evaluation, education, and management at the request of Dr. Janee Morn (PCP) and was last seen by him on 03/10/18. Patient reports increase in stomach gas and takes gas ex for symptom relief. Recently had new grandbaby. She reports having a viral URI last month in which she was given an albuterol inhaler PRN and benzonatate BID PRN cough. She states this has since improved and she has not needed to use her inhaler in about a week. However, she says her blood sugar did increase some acutely during this time with not feeling well.  Insurance coverage/medication affordability: Medicaid  Patient reports adherence with medications.  Current diabetes medications include: metformin 1000 mg BID, Victoza 1.8 mg daily, Jardiance 25 mg daily, Lantus 50 mg daily.  Current hypertension medications include: lisinopril 10 mg daily  Current hyperlipidemia medications include: none  Patient denies hypoglycemic events.  Patient reported dietary habits: Eats 3 meals/day Breakfast: Cereal (cornflakes); 2% milk Lunch: Ham sandwich; occasionally chips,  Dinner: Chicken soup (chicken, potatoes, green beans) Snacks: Not much of a snacker Drinks: diet pepsi; bottled water throughout the day   Patient-reported exercise habits: Walks outside + around [place] weekly    Patient reports 3x nocturia.  Patient denies neuropathy. Patient denies visual changes. Patient reports self foot exams.    O:  Physical Exam Neurological:     Mental Status: She is alert.    Review of Systems  Gastrointestinal:       Gas retention  All other systems reviewed and are negative.   Lab Results  Component Value Date   HGBA1C 7.4 (A) 02/10/2018   Vitals:   05/29/18 1125  BP: 102/68  Pulse: 88  SpO2: 97%    Lipid Panel      Component Value Date/Time   CHOL 171 08/16/2017 1540   TRIG 158 (H) 08/16/2017 1540   HDL 49 08/16/2017 1540   CHOLHDL 3.5 08/16/2017 1540   LDLCALC 90 08/16/2017 1540    Home fasting CBG: 100-110 Random/after eating: 160-170s  Clinical ASCVD: The 10-year ASCVD risk score Denman George DC Jr., et al., 2013) is: 1.4%   Values used to calculate the score:     Age: 53 years     Sex: Female     Is Non-Hispanic African American: No     Diabetic: Yes     Tobacco smoker: No     Systolic Blood Pressure: 102 mmHg     Is BP treated: No     HDL Cholesterol: 49 mg/dL     Total Cholesterol: 171 mg/dL    A/P: Diabetes controlled with current regimen. Patient is adherent with medication. -Decreased dose of Lantus 50 units daily to 44 units daily with current reported blood sugars within goal. Patient instructed to increase to 46 units daily if fasting blood glucose is >130. -Continue metformin 1000 mg BID, Victoza 1.8 mg daily, and Jardiance 25 mg daily -Next A1C today   ASCVD risk - primary prevention in patient with DM. Last LDL is controlled. ASCVD risk score is not >20%  -Discussed with patient about possibly starting statin therapy. Lipid panel ordered today. Patient instructed to discuss with Dr. Janee Morn during visit tomorrow about starting statin if LDL > 70 on lipid panel taken today  Hypertension longstanding currently controlled.  BP  at goal <130/80 mmHg - Continue lisinopril 10 mg daily  Lab Reults:  Returned 3/13:  A1C = 8.0 LDL = 102  Patient contacted via phone and she will discuss with PCP - Janee Morn in a few weeks.    Written patient instructions provided.  Total time in face to face counseling 32 minutes.   Follow up with Dr. Janee Morn for PCP visit tomorrow, 05/30/18. Will review lab work during this visit. Patient seen with Danae Orleans, PharmD, PGY-1 resident. and Catie Feliz Beam, PharmD,  PGY2 Pharmacy Resident.

## 2018-05-29 NOTE — Progress Notes (Signed)
Patient ID: Rita Bass, female   DOB: [birth year], 53 y.o.   MRN: [id number] Reviewed: Agree with Dr. Macky Lower documentation and management.

## 2018-05-29 NOTE — Patient Instructions (Addendum)
It was great to see you today! GREAT WORK!  We are only going to make 1 change:  1) Decrease Lantus to 44 units daily. If you start to see fasting blood sugars be greater than 130, you can increase to 46 units.   We are going to check A1c, renal function, liver function, and cholesterol today so that you and Dr. Janee Morn have those results tomorrow. You can discuss starting a cholesterol medication at that time.   You and Dr. Janee Morn decide in the future if you need to come back and see Pharmacy.

## 2018-05-29 NOTE — Assessment & Plan Note (Signed)
ASCVD risk - primary prevention in patient with DM. Last LDL is controlled. ASCVD risk score is not >20%  -Discussed with patient about possibly starting statin therapy. Lipid panel ordered today. Patient instructed to discuss with Dr. Janee Morn during visit tomorrow about starting statin if LDL > 70 on lipid panel taken today

## 2018-05-30 ENCOUNTER — Ambulatory Visit: Payer: Medicaid Other | Admitting: Family Medicine

## 2018-05-30 LAB — COMPREHENSIVE METABOLIC PANEL
ALT: 17 IU/L (ref 0–32)
AST: 15 IU/L (ref 0–40)
Albumin/Globulin Ratio: 1.3 (ref 1.2–2.2)
Albumin: 4.3 g/dL (ref 3.8–4.8)
Alkaline Phosphatase: 93 IU/L (ref 39–117)
BUN/Creatinine Ratio: 19 (ref 9–23)
BUN: 10 mg/dL (ref 6–24)
Bilirubin Total: 0.4 mg/dL (ref 0.0–1.2)
CALCIUM: 9.6 mg/dL (ref 8.7–10.2)
CO2: 25 mmol/L (ref 20–29)
Chloride: 98 mmol/L (ref 96–106)
Creatinine, Ser: 0.54 mg/dL — ABNORMAL LOW (ref 0.57–1.00)
GFR calc Af Amer: 127 mL/min/{1.73_m2} (ref >59)
GFR calc non Af Amer: 110 mL/min/{1.73_m2} (ref >59)
Globulin, Total: 3.2 g/dL (ref 1.5–4.5)
Glucose: 167 mg/dL — ABNORMAL HIGH (ref 65–99)
Potassium: 4.4 mmol/L (ref 3.5–5.2)
Sodium: 139 mmol/L (ref 134–144)
Total Protein: 7.5 g/dL (ref 6.0–8.5)

## 2018-05-30 LAB — LIPID PANEL
CHOL/HDL RATIO: 3.4 ratio (ref 0.0–4.4)
Cholesterol, Total: 177 mg/dL (ref 100–199)
HDL: 52 mg/dL (ref >39)
LDL Calculated: 102 mg/dL — ABNORMAL HIGH (ref 0–99)
Triglycerides: 116 mg/dL (ref 0–149)
VLDL Cholesterol Cal: 23 mg/dL (ref 5–40)

## 2018-05-30 LAB — HEMOGLOBIN A1C
Est. average glucose Bld gHb Est-mCnc: 183 mg/dL
Hgb A1c MFr Bld: 8 % — ABNORMAL HIGH (ref 4.8–5.6)

## 2018-06-04 NOTE — Progress Notes (Deleted)
Called Rita Bass.   she did not answers.  I did leave a voicemail.   I informed her that in light of recent events surround the Coronavirus and COVID19, that we at [facility] are trying to prevent the spread of the virus to our patients through unnecessary exposure at the office.  I discussed the nature of her upcoming visit on 06/13/2018 which was medications management for DM.   And that I felt that it was possible that we could postpone the visit for 2 months.   If she wanted to be seen in the clinic, however, that we were happy to see her for her schedule issue or for any other issue that she may have.   Additionally, I informed her to the clinic at [phone] if she had any other questions.

## 2018-06-11 ENCOUNTER — Telehealth: Payer: Self-pay | Admitting: Family Medicine

## 2018-06-11 NOTE — Telephone Encounter (Signed)
Called patient twice to try and reschedule vs. Perform virtual visit. Patient has not answered either time. No VM.

## 2018-06-13 ENCOUNTER — Ambulatory Visit: Payer: Medicaid Other | Admitting: Family Medicine

## 2018-06-18 DIAGNOSIS — G4733 Obstructive sleep apnea (adult) (pediatric): Secondary | ICD-10-CM | POA: Diagnosis not present

## 2018-06-26 ENCOUNTER — Telehealth: Payer: Self-pay | Admitting: Family Medicine

## 2018-06-26 NOTE — Telephone Encounter (Signed)
Pt called to ask a few questions regarding her taking out life insurance policy. Please give pt a call back.

## 2018-06-26 NOTE — Telephone Encounter (Signed)
Patient inquired if she has pultiple diagnosis for insurance (life insureance) purposes.  Her children are trying to get a policy on her.   She is aware she has type 2 diabetes,  We agreed that she has lung disease with use of Duler and Aleep apnea diagnosis.  We agreed she has heart disease as she has heart faiilure diagnosis.   She knows she is HIV Negative (07/2017) which was verified.  She also is aware of her current weight.   She was thankful for the clarifications of Dx.  She will folliw with Dr. Janee Morn.

## 2018-07-09 ENCOUNTER — Other Ambulatory Visit: Payer: Self-pay | Admitting: *Deleted

## 2018-07-09 DIAGNOSIS — G8929 Other chronic pain: Secondary | ICD-10-CM

## 2018-07-09 DIAGNOSIS — M25561 Pain in right knee: Principal | ICD-10-CM

## 2018-07-10 ENCOUNTER — Other Ambulatory Visit: Payer: Self-pay | Admitting: Family Medicine

## 2018-07-10 DIAGNOSIS — M25561 Pain in right knee: Principal | ICD-10-CM

## 2018-07-10 DIAGNOSIS — G8929 Other chronic pain: Secondary | ICD-10-CM

## 2018-07-10 MED ORDER — TRAMADOL HCL 50 MG PO TABS: 100.0000 mg | ORAL_TABLET | Freq: Four times a day (QID) | ORAL | 0 refills | Status: DC | PRN

## 2018-07-15 NOTE — Progress Notes (Signed)
Rx was marked as phone in.  I called to verify that med was not called in.  I called in verbally. Jone Baseman, CMA

## 2018-07-18 DIAGNOSIS — G4733 Obstructive sleep apnea (adult) (pediatric): Secondary | ICD-10-CM | POA: Diagnosis not present

## 2018-08-18 DIAGNOSIS — G4733 Obstructive sleep apnea (adult) (pediatric): Secondary | ICD-10-CM | POA: Diagnosis not present

## 2018-09-15 ENCOUNTER — Ambulatory Visit: Payer: Medicaid Other | Admitting: Family Medicine

## 2018-09-15 ENCOUNTER — Other Ambulatory Visit: Payer: Self-pay

## 2018-09-15 ENCOUNTER — Encounter: Payer: Self-pay | Admitting: Family Medicine

## 2018-09-15 VITALS — BP 112/64 | HR 89 | Temp 98.6°F | Wt 327.0 lb

## 2018-09-15 DIAGNOSIS — J069 Acute upper respiratory infection, unspecified: Secondary | ICD-10-CM | POA: Diagnosis not present

## 2018-09-15 DIAGNOSIS — G8929 Other chronic pain: Secondary | ICD-10-CM

## 2018-09-15 DIAGNOSIS — E118 Type 2 diabetes mellitus with unspecified complications: Secondary | ICD-10-CM | POA: Diagnosis not present

## 2018-09-15 DIAGNOSIS — J984 Other disorders of lung: Secondary | ICD-10-CM

## 2018-09-15 DIAGNOSIS — M25561 Pain in right knee: Secondary | ICD-10-CM | POA: Diagnosis not present

## 2018-09-15 DIAGNOSIS — R109 Unspecified abdominal pain: Secondary | ICD-10-CM | POA: Insufficient documentation

## 2018-09-15 DIAGNOSIS — Z1211 Encounter for screening for malignant neoplasm of colon: Secondary | ICD-10-CM

## 2018-09-15 DIAGNOSIS — B9789 Other viral agents as the cause of diseases classified elsewhere: Secondary | ICD-10-CM

## 2018-09-15 DIAGNOSIS — R140 Abdominal distension (gaseous): Secondary | ICD-10-CM | POA: Diagnosis not present

## 2018-09-15 LAB — POCT GLYCOSYLATED HEMOGLOBIN (HGB A1C): HbA1c, POC (controlled diabetic range): 8.7 % — AB (ref 0.0–7.0)

## 2018-09-15 MED ORDER — DICYCLOMINE HCL 10 MG PO CAPS: 10.0000 mg | ORAL_CAPSULE | Freq: Three times a day (TID) | ORAL | 0 refills | Status: DC

## 2018-09-15 MED ORDER — MOMETASONE FURO-FORMOTEROL FUM 100-5 MCG/ACT IN AERO: 2.0000 | INHALATION_SPRAY | Freq: Every day | RESPIRATORY_TRACT | 2 refills | Status: DC

## 2018-09-15 MED ORDER — TRAMADOL HCL 50 MG PO TABS: 100.0000 mg | ORAL_TABLET | Freq: Four times a day (QID) | ORAL | 0 refills | Status: DC | PRN

## 2018-09-15 MED ORDER — SIMETHICONE 80 MG PO CHEW: 80.0000 mg | CHEWABLE_TABLET | Freq: Four times a day (QID) | ORAL | 0 refills | Status: DC | PRN

## 2018-09-15 MED ORDER — NYSTATIN 100000 UNIT/GM EX POWD: Freq: Four times a day (QID) | CUTANEOUS | 2 refills | Status: DC | PRN

## 2018-09-15 MED ORDER — ALBUTEROL SULFATE HFA 108 (90 BASE) MCG/ACT IN AERS: 1.0000 | INHALATION_SPRAY | Freq: Four times a day (QID) | RESPIRATORY_TRACT | 2 refills | Status: DC | PRN

## 2018-09-15 NOTE — Patient Instructions (Signed)
It was a pleasure to see you today! Thank you for choosing [facility] for your primary care. Rita Bass was seen for abdominal pain.   1.  For your abdominal pain, we are trying a medicine called Bentyl.  To take this 4 times a day with meals and at night.  Continue to also take the simethicone like you have been doing as well.  Follow-up in 1 month for this.  2.  We have refilled your medications as needed.  Best,  Marny Lowenstein, MD, MS FAMILY MEDICINE RESIDENT - PGY2 09/15/2018 2:21 PM

## 2018-09-15 NOTE — Assessment & Plan Note (Signed)
Crampy abdominal pain associate with some loose stools and bloating.  About 1 month.  Possible could be IBS-D although patient does not seem to be in severe distress about it.  Recommend patient continue trial OTC Gas-X and will trial Bentyl for abdominal crampiness.  Patient also to stay on Protonix.  Patient follow-up in 1 month or sooner as needed for ongoing pain.

## 2018-09-15 NOTE — Progress Notes (Signed)
Subjective:  Rita Bass is a 53 y.o. female who presents to the [facility] today with a chief complaint of complaint of left shoulder pain, right finger pain and stomach/abdominal cramping and bloating and medicine refills.   HPI:  Based on agenda setting patient's abdominal pain was the most important thing to her so that is what was discussed.  Other problems of finger pain and shoulder pain will be deferred to later appointments.  Stomach pain. 1 month of what is describes it as gassiness "bubbling and bloating "and cramping after meals.  Not associated with any vomiting.  Is better when she lays down.  Pain is diffuse throughout her entire abdomen.  She has had no melena or bright red blood per rectum.  Endorses some loose stools x3 a week but this is not associated with the pain.  She is tried Gas-X which helps minimally.  Denies any heartburn-like symptoms.  Patient takes Protonix which is helped with the heartburn in the past.  Patient's diet consist of typically cereal in the morning, sandwich for lunch, baked chicken in the evening.  Patient seems to avoid spicy and greasy foods.  Medication refills Patient request refill on her tramadol for her knee pain which she takes as needed.  She needs refill on her albuterol, Dulera for COPD, Patient needs prescription for pain refill on nystatin powder to prevent intertrigo. We will refill as requested.  Health maintenance Has patient follow-up at next visit for Pap smear. Patient has elevated A1c at 8.7, last A1c was 8.0.  Patient follow-up in 1 month to discuss next visit.  Objective:  Physical Exam: BP 112/64   Pulse 89   Temp 98.6 F (37 C) (Oral)   Wt (!) 327 lb (148.3 kg)   SpO2 95%   BMI 57.93 kg/m   Gen: NAD, resting comfortably CV: RRR with no murmurs appreciated Pulm: NWOB, CTAB with no crackles, wheezes, or rhonchi GI: Normal bowel sounds present. Soft, Nontender, Nondistended. MSK: no edema, cyanosis, or clubbing  noted Skin: warm, dry Neuro: grossly normal, moves all extremities Psych: Normal affect and thought content  Results for orders placed or performed in visit on 09/15/18 (from the past 72 hour(s))  HgB A1c     Status: Abnormal   Collection Time: 09/15/18  2:10 PM  Result Value Ref Range   Hemoglobin A1C     HbA1c POC (<> result, manual entry)     HbA1c, POC (prediabetic range)     HbA1c, POC (controlled diabetic range) 8.7 (A) 0.0 - 7.0 %     Assessment/Plan:  Abdominal cramping Crampy abdominal pain associate with some loose stools and bloating.  About 1 month.  Possible could be IBS-D although patient does not seem to be in severe distress about it.  Recommend patient continue trial OTC Gas-X and will trial Bentyl for abdominal crampiness.  Patient also to stay on Protonix.  Patient follow-up in 1 month or sooner as needed for ongoing pain.    Lab Orders     HgB A1c  Meds ordered this encounter  Medications  . dicyclomine (BENTYL) 10 MG capsule    Sig: Take 1 capsule (10 mg total) by mouth 4 (four) times daily -  before meals and at bedtime for 30 days.    Dispense:  120 capsule    Refill:  0  . simethicone (GAS-X) 80 MG chewable tablet    Sig: Chew 1 tablet (80 mg total) by mouth every 6 (six) hours as  needed for flatulence.    Dispense:  30 tablet    Refill:  0  . albuterol (VENTOLIN HFA) 108 (90 Base) MCG/ACT inhaler    Sig: Inhale 1-2 puffs into the lungs every 6 (six) hours as needed for wheezing or shortness of breath.    Dispense:  18 g    Refill:  2  . mometasone-formoterol (DULERA) 100-5 MCG/ACT AERO    Sig: Inhale 2 puffs into the lungs daily.    Dispense:  13 g    Refill:  2  . nystatin (MYCOSTATIN/NYSTOP) powder    Sig: Apply topically 4 (four) times daily as needed.    Dispense:  60 g    Refill:  2  . DISCONTD: traMADol (ULTRAM) 50 MG tablet    Sig: Take 2 tablets (100 mg total) by mouth every 6 (six) hours as needed for moderate pain.    Dispense:  30  tablet    Refill:  0  . traMADol (ULTRAM) 50 MG tablet    Sig: Take 2 tablets (100 mg total) by mouth every 6 (six) hours as needed for moderate pain.    Dispense:  30 tablet    Refill:  0      Thomes DinningBrad , MD, MS FAMILY MEDICINE RESIDENT - PGY2 09/15/2018 4:14 PM

## 2018-09-16 ENCOUNTER — Telehealth: Payer: Self-pay

## 2018-09-16 NOTE — Telephone Encounter (Signed)
Called and left a VM for patient to CB concerning gel injection for right knee.

## 2018-09-23 ENCOUNTER — Other Ambulatory Visit: Payer: Self-pay | Admitting: Obstetrics and Gynecology

## 2018-09-23 DIAGNOSIS — Z1231 Encounter for screening mammogram for malignant neoplasm of breast: Secondary | ICD-10-CM

## 2018-10-15 ENCOUNTER — Ambulatory Visit: Payer: Medicaid Other | Admitting: Family Medicine

## 2018-10-16 ENCOUNTER — Encounter: Payer: Self-pay | Admitting: Family Medicine

## 2018-10-22 ENCOUNTER — Telehealth: Payer: Self-pay

## 2018-10-22 NOTE — Telephone Encounter (Signed)
Patient calls nurse line stating she had an apt with PCP tomorrow, however was rescheduled for next Thursday. Patient stated she was going to have him send in an alternative for the recalled metformin. Patient doesn't want to wait until next thursday. Please advise.

## 2018-10-23 ENCOUNTER — Ambulatory Visit: Payer: Medicaid Other | Admitting: Family Medicine

## 2018-10-24 NOTE — Telephone Encounter (Signed)
Pt is calling back and wants to know when her new diabetes medication is going to be called since they have recalled her Metformin. Rita Bass

## 2018-10-24 NOTE — Telephone Encounter (Signed)
Will forward to MD to advise. Arleen Bar,CMA  

## 2018-10-27 ENCOUNTER — Telehealth (INDEPENDENT_AMBULATORY_CARE_PROVIDER_SITE_OTHER): Payer: Medicaid Other | Admitting: Family Medicine

## 2018-10-27 ENCOUNTER — Other Ambulatory Visit: Payer: Self-pay

## 2018-10-27 DIAGNOSIS — Z7689 Persons encountering health services in other specified circumstances: Secondary | ICD-10-CM | POA: Diagnosis not present

## 2018-10-27 NOTE — Progress Notes (Signed)
[facility] Tele[facility] Visit  Patient consented to have virtual visit. Method of visit: Telephone  Encounter participants: Patient: Rita Bass - located at home Provider: Bonnita Hollow - located at office Others (if applicable): Not applicable  Chief Complaint: Metformin recall  HPI: Rita Bass is a 53 y.o. female who presents with concern about her metformin prescription.  Patient was informed by her pharmacy that her the 500 mg tablets of metformin that she was taking had been recalled due to containing a toxic carcinogen and it.  Patient was informed by her pharmacy call her doctor to get a new prescription.    ROS: per HPI  Pertinent PMHx: Diabetes mellitus  Exam:  Respiratory: Able to speak in full sentences without issue  Assessment/Plan: Metformin prescription Patient metformin prescription was recalled due to containing carcinogen.  To my knowledge this is only the 500 mg tablets and does not affect the 1000 mg tablets.  Will write new prescription at 1000 mg twice daily to see if this resolves the issue.  Patient to call back if it does not we have to reconsider trying the metformin XR versus picking a different agent altogether or trying an alternative pharmacy.   Time spent during visit with patient: 5 minutes

## 2018-10-28 NOTE — Telephone Encounter (Signed)
Patient seen in virtual visit to address this issue.

## 2018-10-30 ENCOUNTER — Ambulatory Visit: Payer: Medicaid Other | Admitting: Family Medicine

## 2018-11-05 ENCOUNTER — Other Ambulatory Visit: Payer: Self-pay | Admitting: Family Medicine

## 2018-11-05 DIAGNOSIS — E118 Type 2 diabetes mellitus with unspecified complications: Secondary | ICD-10-CM

## 2018-11-05 DIAGNOSIS — G8929 Other chronic pain: Secondary | ICD-10-CM

## 2018-11-05 MED ORDER — METFORMIN HCL 500 MG PO TABS: 1000.0000 mg | ORAL_TABLET | Freq: Two times a day (BID) | ORAL | 3 refills | Status: DC

## 2018-11-05 MED ORDER — TRAMADOL HCL 50 MG PO TABS: 100.0000 mg | ORAL_TABLET | Freq: Four times a day (QID) | ORAL | 0 refills | Status: DC | PRN

## 2018-11-05 MED ORDER — NYSTATIN 100000 UNIT/GM EX POWD: Freq: Four times a day (QID) | CUTANEOUS | 2 refills | Status: DC | PRN

## 2018-11-05 NOTE — Telephone Encounter (Signed)
Pt called to get a refill on her traMODol, nystatin, and pt states that she'd like a bigger bottle if possible, and pt also states that she needs refill for her metFORMIN, she needs 1,000 mg instead of 500 mg. Please give pt a call back.

## 2018-11-05 NOTE — Telephone Encounter (Signed)
Will forward to MD to advise. Jazmin Hartsell,CMA  

## 2018-11-07 ENCOUNTER — Ambulatory Visit: Payer: Medicaid Other

## 2018-11-19 ENCOUNTER — Ambulatory Visit: Payer: Medicaid Other | Admitting: Family Medicine

## 2018-11-26 ENCOUNTER — Other Ambulatory Visit (INDEPENDENT_AMBULATORY_CARE_PROVIDER_SITE_OTHER): Payer: Medicaid Other

## 2018-11-26 ENCOUNTER — Other Ambulatory Visit: Payer: Self-pay

## 2018-11-26 ENCOUNTER — Encounter: Payer: Self-pay | Admitting: Family Medicine

## 2018-11-26 DIAGNOSIS — E118 Type 2 diabetes mellitus with unspecified complications: Secondary | ICD-10-CM

## 2018-11-26 LAB — POCT GLYCOSYLATED HEMOGLOBIN (HGB A1C): HbA1c, POC (controlled diabetic range): 8.6 % — AB (ref 0.0–7.0)

## 2018-11-28 ENCOUNTER — Telehealth: Payer: Self-pay | Admitting: Family Medicine

## 2018-11-28 NOTE — Telephone Encounter (Signed)
Patient informed of results. Eli Adami,CMA  

## 2018-11-28 NOTE — Telephone Encounter (Signed)
Patient would like the results of her A1c test she had this week, given to her today before her appt on Monday.

## 2018-12-01 ENCOUNTER — Ambulatory Visit: Payer: Medicaid Other | Admitting: Family Medicine

## 2018-12-22 ENCOUNTER — Other Ambulatory Visit: Payer: Self-pay

## 2018-12-22 ENCOUNTER — Encounter: Payer: Self-pay | Admitting: Family Medicine

## 2018-12-22 ENCOUNTER — Ambulatory Visit (INDEPENDENT_AMBULATORY_CARE_PROVIDER_SITE_OTHER): Payer: Medicaid Other | Admitting: Family Medicine

## 2018-12-22 ENCOUNTER — Ambulatory Visit
Admission: RE | Admit: 2018-12-22 | Discharge: 2018-12-22 | Disposition: A | Discharge status: Home / Self Care | Payer: Medicaid Other | Source: Ambulatory Visit | Attending: Obstetrics and Gynecology | Admitting: Obstetrics and Gynecology

## 2018-12-22 VITALS — BP 110/64 | HR 92 | Ht 63.0 in | Wt 322.0 lb

## 2018-12-22 DIAGNOSIS — Z760 Encounter for issue of repeat prescription: Secondary | ICD-10-CM

## 2018-12-22 DIAGNOSIS — G8929 Other chronic pain: Secondary | ICD-10-CM | POA: Diagnosis not present

## 2018-12-22 DIAGNOSIS — M25561 Pain in right knee: Secondary | ICD-10-CM | POA: Diagnosis not present

## 2018-12-22 DIAGNOSIS — R140 Abdominal distension (gaseous): Secondary | ICD-10-CM | POA: Diagnosis not present

## 2018-12-22 DIAGNOSIS — Z9889 Other specified postprocedural states: Secondary | ICD-10-CM | POA: Diagnosis not present

## 2018-12-22 DIAGNOSIS — E118 Type 2 diabetes mellitus with unspecified complications: Secondary | ICD-10-CM | POA: Diagnosis not present

## 2018-12-22 DIAGNOSIS — Z8719 Personal history of other diseases of the digestive system: Secondary | ICD-10-CM | POA: Insufficient documentation

## 2018-12-22 DIAGNOSIS — K219 Gastro-esophageal reflux disease without esophagitis: Secondary | ICD-10-CM

## 2018-12-22 DIAGNOSIS — Z1211 Encounter for screening for malignant neoplasm of colon: Secondary | ICD-10-CM

## 2018-12-22 DIAGNOSIS — Z23 Encounter for immunization: Secondary | ICD-10-CM

## 2018-12-22 DIAGNOSIS — Z1231 Encounter for screening mammogram for malignant neoplasm of breast: Secondary | ICD-10-CM | POA: Diagnosis not present

## 2018-12-22 DIAGNOSIS — R109 Unspecified abdominal pain: Secondary | ICD-10-CM | POA: Diagnosis not present

## 2018-12-22 LAB — POCT GLYCOSYLATED HEMOGLOBIN (HGB A1C): HbA1c, POC (controlled diabetic range): 8.1 % — AB (ref 0.0–7.0)

## 2018-12-22 MED ORDER — OMEPRAZOLE 40 MG PO CPDR: 40.0000 mg | DELAYED_RELEASE_CAPSULE | Freq: Every day | ORAL | 3 refills | Status: DC

## 2018-12-22 MED ORDER — ATORVASTATIN CALCIUM 40 MG PO TABS: 40.0000 mg | ORAL_TABLET | Freq: Every day | ORAL | 3 refills | Status: DC

## 2018-12-22 MED ORDER — TRAMADOL HCL 50 MG PO TABS: 100.0000 mg | ORAL_TABLET | Freq: Four times a day (QID) | ORAL | 0 refills | Status: DC | PRN

## 2018-12-22 MED ORDER — DICYCLOMINE HCL 10 MG PO CAPS: 10.0000 mg | ORAL_CAPSULE | Freq: Three times a day (TID) | ORAL | 0 refills | Status: DC

## 2018-12-22 NOTE — Assessment & Plan Note (Signed)
BMI 57.  Not reached goal with diet and exercise.  Will refer to bariatric surgery for further evaluation.

## 2018-12-22 NOTE — Assessment & Plan Note (Signed)
Medications refilled as requested

## 2018-12-22 NOTE — Patient Instructions (Signed)
It was a pleasure to see you today! Thank you for choosing [facility] for your primary care. Rita Bass was seen for diabetes follow up.   1.  For your diabetes, we are going to keep things the way they are. Your A1C is better today.   2.  We refilled your medication as requested.  3.  We are sending you Cologuard for your colon cancer test.  We will get the results in the mail.  4.  We are referring you to an eye doctor for diabetic eye exam.  5.  We are referring you to bariatric surgery for weight loss.  6.  We will schedule your ultrasound for your abdominal wall to look for hernia.  Come back to the clinic in 3 months.   Best,  Marny Lowenstein, MD, MS FAMILY MEDICINE RESIDENT - PGY3 12/22/2018 2:49 PM

## 2018-12-22 NOTE — Assessment & Plan Note (Signed)
Unable to go to gastroenterology.  Will order Cologuard.

## 2018-12-22 NOTE — Assessment & Plan Note (Addendum)
A1c improved.  Continue on current regimen without change.  See if the trend continues.  Patient needs to be on a statin.   -Ambulatory referral to ophthalmology -Will start atorvastatin 40 mg.  -Follow-up in 3 months.

## 2018-12-22 NOTE — Progress Notes (Signed)
Subjective:  Rita Bass is a 53 y.o. female who presents to the [facility] today with a chief complaint of diabetes follow-up.   HPI:  Diabetes follow-up Patient here for 5-month routine diabetes follow-up.  Has had progressive improvement in A1c.  Today is 8.1 down from 8.6.  Patient is currently on metformin, empagliflozin, Victoza, Lantus 44 units daily.  Fasting CBGs in the a.m. are 120s.  Patient denies any signs or symptoms of hypoglycemia.  That she is at limited ability to exercise due to progressive knee pain from osteoarthritis.  Patient is not on on a statin medication.  Patient needs diabetic foot exam, annual ophthalmologic diabetic exam.  Blood pressure is well controlled.  Colon cancer screening Patient was previously referred to gastroenterologist, but has difficulty getting to the appointment.  Would prefer to have something done at home.  Morbid obesity Patient has morbid obesity with BMI of 57.  She is at difficulty controlling weight.  She is interested in referral to bariatric surgery.  Abdominal pain with history of umbilical hernia Patient reports intermittent periumbilical pain.  She has had umbilical hernia repair in the past.  She is concerned it may have recurred.  She denies any pain now.  Medication refill Patient requesting medication refill dicyclomine, omeprazole, tramadol.   Flu shot Patient requested annual flu shot  ROS: Per HPI  PMH: Smoking history reviewed.    Objective:  Physical Exam: BP 110/64   Pulse 92   Ht 5\' 3"  (1.6 m)   Wt (!) 322 lb (146.1 kg)   SpO2 94%   BMI 57.04 kg/m   Gen: NAD, resting comfortably, morbid obesity CV: RRR with no murmurs appreciated Pulm: NWOB, CTAB with no crackles, wheezes, or rhonchi GI: Normal bowel sounds present. Soft, Nontender, Nondistended.,  No hernia observed in the umbilicus MSK: no edema, cyanosis, or clubbing noted Skin: warm, dry Neuro: grossly normal, moves all extremities Psych:  Normal affect and thought content  Results for orders placed or performed in visit on 12/22/18 (from the past 72 hour(s))  HgB A1c     Status: Abnormal   Collection Time: 12/22/18  2:50 PM  Result Value Ref Range   Hemoglobin A1C     HbA1c POC (<> result, manual entry)     HbA1c, POC (prediabetic range)     HbA1c, POC (controlled diabetic range) 8.1 (A) 0.0 - 7.0 %    Diabetic Foot Exam - Simple   Simple Foot Form Diabetic Foot exam was performed with the following findings: Yes 12/22/2018  3:16 PM  Visual Inspection No deformities, no ulcerations, no other skin breakdown bilaterally: Yes Sensation Testing Intact to touch and monofilament testing bilaterally: Yes Pulse Check Posterior Tibialis and Dorsalis pulse intact bilaterally: Yes Comments      Assessment/Plan:  Type 2 diabetes mellitus with complication (HCC) E3P improved.  Continue on current regimen without change.  See if the trend continues.  Patient needs to be on a statin.   -Ambulatory referral to ophthalmology -Will start atorvastatin 40 mg.  -Follow-up in 3 months.  History of umbilical hernia repair No observed hernia on exam.  Although has large central pannus and difficult exam. -Abdominal wall ultrasound to look for hernia  Morbid obesity ([place]) BMI 57.  Not reached goal with diet and exercise.  Will refer to bariatric surgery for further evaluation.  Screen for colon cancer Unable to go to gastroenterology.  Will order Cologuard.  Medication refill Medications refilled as requested.  Lab Orders     Cologuard     HgB A1c  Meds ordered this encounter  Medications  . traMADol (ULTRAM) 50 MG tablet    Sig: Take 2 tablets (100 mg total) by mouth every 6 (six) hours as needed for moderate pain.    Dispense:  30 tablet    Refill:  0  . dicyclomine (BENTYL) 10 MG capsule    Sig: Take 1 capsule (10 mg total) by mouth 4 (four) times daily -  before meals and at bedtime.    Dispense:  120 capsule     Refill:  0  . omeprazole (PRILOSEC) 40 MG capsule    Sig: Take 1 capsule (40 mg total) by mouth daily.    Dispense:  30 capsule    Refill:  3  . atorvastatin (LIPITOR) 40 MG tablet    Sig: Take 1 tablet (40 mg total) by mouth daily.    Dispense:  90 tablet    Refill:  3    Thomes Dinning, MD, MS FAMILY MEDICINE RESIDENT - PGY3 12/22/2018 3:22 PM

## 2018-12-22 NOTE — Assessment & Plan Note (Signed)
No observed hernia on exam.  Although has large central pannus and difficult exam. -Abdominal wall ultrasound to look for hernia

## 2018-12-29 ENCOUNTER — Other Ambulatory Visit: Payer: Medicaid Other

## 2018-12-29 ENCOUNTER — Telehealth: Payer: Self-pay

## 2018-12-29 NOTE — Telephone Encounter (Signed)
Called and left a VM advising patient to CB concerning gel injection for right knee.

## 2019-01-02 ENCOUNTER — Ambulatory Visit
Admission: RE | Admit: 2019-01-02 | Discharge: 2019-01-02 | Disposition: A | Discharge status: Home / Self Care | Payer: Medicaid Other | Source: Ambulatory Visit | Attending: Family Medicine | Admitting: Family Medicine

## 2019-01-02 DIAGNOSIS — K439 Ventral hernia without obstruction or gangrene: Secondary | ICD-10-CM | POA: Diagnosis not present

## 2019-01-02 DIAGNOSIS — Z8719 Personal history of other diseases of the digestive system: Secondary | ICD-10-CM

## 2019-01-03 ENCOUNTER — Telehealth (HOSPITAL_BASED_OUTPATIENT_CLINIC_OR_DEPARTMENT_OTHER): Payer: Self-pay | Admitting: Family Medicine

## 2019-01-03 ENCOUNTER — Other Ambulatory Visit: Payer: Self-pay | Admitting: Family Medicine

## 2019-01-03 DIAGNOSIS — K439 Ventral hernia without obstruction or gangrene: Secondary | ICD-10-CM

## 2019-01-03 NOTE — Progress Notes (Signed)
error 

## 2019-01-03 NOTE — Progress Notes (Signed)
Ordered CT abdomen pelvis to evaluate ventral hernia. Will have staff call and schedule for patient. Also placed referral to surgery.

## 2019-01-03 NOTE — Telephone Encounter (Signed)
Error

## 2019-01-05 ENCOUNTER — Other Ambulatory Visit: Payer: Medicaid Other

## 2019-01-06 NOTE — Progress Notes (Signed)
appt made already and pt was notified via radiology scheduling.  Ree Alcalde,CMA

## 2019-01-12 ENCOUNTER — Other Ambulatory Visit: Payer: Medicaid Other

## 2019-01-22 ENCOUNTER — Other Ambulatory Visit: Payer: Self-pay | Admitting: Family Medicine

## 2019-01-22 ENCOUNTER — Other Ambulatory Visit: Payer: Self-pay | Admitting: Internal Medicine

## 2019-01-22 ENCOUNTER — Inpatient Hospital Stay: Admission: RE | Admit: 2019-01-22 | Payer: Medicaid Other | Source: Ambulatory Visit

## 2019-01-22 DIAGNOSIS — K439 Ventral hernia without obstruction or gangrene: Secondary | ICD-10-CM

## 2019-01-22 DIAGNOSIS — R52 Pain, unspecified: Secondary | ICD-10-CM

## 2019-01-25 ENCOUNTER — Other Ambulatory Visit: Payer: Self-pay

## 2019-01-25 ENCOUNTER — Ambulatory Visit (HOSPITAL_COMMUNITY)
Admission: EM | Admit: 2019-01-25 | Discharge: 2019-01-25 | Disposition: A | Discharge status: Home / Self Care | Payer: Medicaid Other | Attending: Family Medicine | Admitting: Family Medicine

## 2019-01-25 ENCOUNTER — Encounter (HOSPITAL_COMMUNITY): Payer: Self-pay | Admitting: Emergency Medicine

## 2019-01-25 DIAGNOSIS — M545 Low back pain, unspecified: Secondary | ICD-10-CM

## 2019-01-25 LAB — POCT URINALYSIS DIP (DEVICE)
Bilirubin Urine: NEGATIVE
Glucose, UA: NEGATIVE mg/dL
Hgb urine dipstick: NEGATIVE
Ketones, ur: NEGATIVE mg/dL
Nitrite: NEGATIVE
Protein, ur: NEGATIVE mg/dL
Specific Gravity, Urine: 1.02 (ref 1.005–1.030)
Urobilinogen, UA: 0.2 mg/dL (ref 0.0–1.0)
pH: 6 (ref 5.0–8.0)

## 2019-01-25 MED ORDER — CYCLOBENZAPRINE HCL 10 MG PO TABS: 10.0000 mg | ORAL_TABLET | Freq: Two times a day (BID) | ORAL | 0 refills | Status: DC | PRN

## 2019-01-25 NOTE — ED Triage Notes (Signed)
Pt reports lower back pain that started on Friday.  She does report that a week ago she had some dysuria and took Azo and it went away.  She denies anymore dysuria but states the back pain goes all the way across her lower back and up a little into both flank areas.

## 2019-01-26 ENCOUNTER — Ambulatory Visit (INDEPENDENT_AMBULATORY_CARE_PROVIDER_SITE_OTHER): Payer: Medicaid Other | Admitting: Family Medicine

## 2019-01-26 ENCOUNTER — Encounter: Payer: Self-pay | Admitting: Family Medicine

## 2019-01-26 ENCOUNTER — Other Ambulatory Visit: Payer: Self-pay

## 2019-01-26 VITALS — BP 130/68 | HR 96 | Wt 324.2 lb

## 2019-01-26 DIAGNOSIS — N939 Abnormal uterine and vaginal bleeding, unspecified: Secondary | ICD-10-CM

## 2019-01-26 DIAGNOSIS — R300 Dysuria: Secondary | ICD-10-CM | POA: Diagnosis not present

## 2019-01-26 DIAGNOSIS — M545 Low back pain, unspecified: Secondary | ICD-10-CM

## 2019-01-26 LAB — POCT URINALYSIS DIP (MANUAL ENTRY)
Bilirubin, UA: NEGATIVE
Blood, UA: NEGATIVE
Glucose, UA: NEGATIVE mg/dL
Ketones, POC UA: NEGATIVE mg/dL
Nitrite, UA: NEGATIVE
Protein Ur, POC: NEGATIVE mg/dL
Spec Grav, UA: 1.02 (ref 1.010–1.025)
Urobilinogen, UA: 0.2 E.U./dL
pH, UA: 6 (ref 5.0–8.0)

## 2019-01-26 LAB — URINE CULTURE: Culture: 10000 — AB

## 2019-01-26 LAB — POCT UA - MICROSCOPIC ONLY

## 2019-01-26 NOTE — Progress Notes (Signed)
Subjective: HPI: Rita Bass is a 53 y.o. presenting to clinic today to discuss the following:  Dysuria/Low Back pain Patient is a 53yo female with PMH of T2DM, chronic pain in both knees, and HFpEF being seen today for pain on urination and low back pain. It started this past Friday when she had pain on urination and then the pain began to localize in her back. She went to the pharmacy and got AZO but over the weekend her pain got worse. She went to urgent care yesterday where they took a urine sample and asked her to follow up here with [name]. Her pain on urination has resolved but she is still having back pain in the lower back that is achy and does not radiate anywhere. She has tried Tylenol, ice, heat, and was given a muscle relaxer at the urgent care. It has helped some.   Vaginal Bleeding Patient reports vaginal bleeding episode that did not occur with urination or bowel movement that lasted about a day and a half. She denies any new sexual activity, no pain, no discharge, no odor. She states it has self-resolved. Before this most recent episode the last time she had a period was "6 or 7 years ago". She has had no vaginal bleeding at all since that time.   ROS noted in HPI.    Social History   Tobacco Use  Smoking Status Never Smoker  Smokeless Tobacco Never Used    Objective: BP 130/68   Pulse 96   Wt (!) 324 lb 3.2 oz (147.1 kg)   SpO2 96%   BMI 57.43 kg/m  Vitals and nursing notes reviewed  Physical Exam Gen: Alert and Oriented x 3, NAD HEENT: Normocephalic, atraumatic CV: RRR, no murmurs, normal S1, S2 split Resp: CTAB, no wheezing, rales, or rhonchi, comfortable work of breathing Abd: non-distended, non-tender, soft, +bs in all four quadrants Ext: no clubbing, cyanosis, or edema Skin: warm, dry, intact, no rashes  Results for orders placed or performed in visit on 01/26/19 (from the past 72 hour(s))  POCT urinalysis dipstick     Status: Abnormal   Collection Time: 01/26/19  3:58 PM  Result Value Ref Range   Color, UA yellow yellow   Clarity, UA clear clear   Glucose, UA negative negative mg/dL   Bilirubin, UA negative negative   Ketones, POC UA negative negative mg/dL   Spec Grav, UA 6.720 9.470 - 1.025   Blood, UA negative negative   pH, UA 6.0 5.0 - 8.0   Protein Ur, POC negative negative mg/dL   Urobilinogen, UA 0.2 0.2 or 1.0 E.U./dL   Nitrite, UA Negative Negative   Leukocytes, UA Trace (A) Negative  POCT UA - Microscopic Only     Status: None   Collection Time: 01/26/19  3:58 PM  Result Value Ref Range   WBC, Ur, HPF, POC 0-3    RBC, urine, microscopic NONE    Bacteria, U Microscopic TRACE    Epithelial cells, urine per micros 5-10   Basic Metabolic Panel     Status: Abnormal   Collection Time: 01/26/19  4:52 PM  Result Value Ref Range   Glucose 178 (H) 65 - 99 mg/dL   BUN 10 6 - 24 mg/dL   Creatinine, Ser 9.62 (L) 0.57 - 1.00 mg/dL   GFR calc non Af Amer 110 >59 mL/min/1.73   GFR calc Af Amer 126 >59 mL/min/1.73   BUN/Creatinine Ratio 19 9 - 23   Sodium  139 134 - 144 mmol/L   Potassium 4.7 3.5 - 5.2 mmol/L   Chloride 97 96 - 106 mmol/L   CO2 25 20 - 29 mmol/L   Calcium 9.7 8.7 - 10.2 mg/dL    Assessment/Plan:  Vaginal bleeding Vaginal bleeding 6 years after menopause requires workup. Discussed with patient that we need to rule out endometrial cancer and we are hoping to find the more likely cause of the bleeding such as fibroid.  - Transvaginal U/S ordered today; scheduled for Friday.  Dysuria Unlikely to be UTI as patient had resolution of her pain on urination quickly. Her U/A from urgent care was only positive for trace leuks, urine cx collected at urgent care suggested recollection - repeat U/A normal here; recollect urine cx and will follow up with results  Low back pain Low back pain most likely MSK in etiology, no concerning signs or symptoms suggestive of nephrolithiasis or pyelonephritis. UTI  looks unlikely given current symptoms as well. I am leaning more toward an acute muscle sprain/spasm of the low back. - Cont conservative management with relative rest, ice/heat, Tylenol, and NSAIDs - cont muscle relaxer she received from urgent care; follow up with [name] in 5-10 days if not better, sooner if it gets worse    PATIENT EDUCATION PROVIDED: See AVS    Diagnosis and plan along with any newly prescribed medication(s) were discussed in detail with this patient today. The patient verbalized understanding and agreed with the plan. Patient advised if symptoms worsen return to clinic or ER.    Orders Placed This Encounter  Procedures  . Urine culture  . US Pelvic Complete With Transvaginal    Epic order Wt 324 / no needs / COVID-19 Q'S = NO Ins - [facility] Bl/shari @ [facility]    Standing Status:   Future    Standing Expiration Date:   02/25/2019    Order Specific Question:   Reason for Exam (SYMPTOM  OR DIAGNOSIS REQUIRED)    Answer:   vaginal bleeding after menopause    Order Specific Question:   Preferred imaging location?    Answer:   [facility]  . Basic Metabolic Panel  . POCT urinalysis dipstick  . POCT UA - Microscopic Only    No orders of the defined types were placed in this encounter.    Harolyn Rutherford, DO 01/26/2019, 3:54 PM PGY-3 [place]

## 2019-01-26 NOTE — Patient Instructions (Addendum)
It was great to meet you today! Thank you for letting me participate in your care!  Today, we discussed several issues. First, your vaginal bleeding needs to be addressed. I have ordered an ultrasound that help [name] determine if we need to do further testing to rule out a form of cancer called endometrial cancer. We will schedule this for you.  For your low back pain continue the muscle relaxer you got at the urgent care, ice, heat, Tylenol, and OTC muscle creams.  For your dysuria I have repeated your urine labs and will call you if they are abnormal and if you will require an antibiotic.  Be well, Harolyn Rutherford, DO PGY-3, [facility]

## 2019-01-27 DIAGNOSIS — R300 Dysuria: Secondary | ICD-10-CM | POA: Insufficient documentation

## 2019-01-27 DIAGNOSIS — M545 Low back pain, unspecified: Secondary | ICD-10-CM | POA: Insufficient documentation

## 2019-01-27 DIAGNOSIS — N939 Abnormal uterine and vaginal bleeding, unspecified: Secondary | ICD-10-CM | POA: Insufficient documentation

## 2019-01-27 LAB — BASIC METABOLIC PANEL
BUN/Creatinine Ratio: 19 (ref 9–23)
BUN: 10 mg/dL (ref 6–24)
CO2: 25 mmol/L (ref 20–29)
Calcium: 9.7 mg/dL (ref 8.7–10.2)
Chloride: 97 mmol/L (ref 96–106)
Creatinine, Ser: 0.54 mg/dL — ABNORMAL LOW (ref 0.57–1.00)
GFR calc Af Amer: 126 mL/min/{1.73_m2} (ref >59)
GFR calc non Af Amer: 110 mL/min/{1.73_m2} (ref >59)
Glucose: 178 mg/dL — ABNORMAL HIGH (ref 65–99)
Potassium: 4.7 mmol/L (ref 3.5–5.2)
Sodium: 139 mmol/L (ref 134–144)

## 2019-01-27 NOTE — Assessment & Plan Note (Signed)
Low back pain most likely MSK in etiology, no concerning signs or symptoms suggestive of nephrolithiasis or pyelonephritis. UTI looks unlikely given current symptoms as well. I am leaning more toward an acute muscle sprain/spasm of the low back. - Cont conservative management with relative rest, ice/heat, Tylenol, and NSAIDs - cont muscle relaxer she received from urgent care; follow up with [name] in 5-10 days if not better, sooner if it gets worse

## 2019-01-27 NOTE — Assessment & Plan Note (Signed)
Vaginal bleeding 6 years after menopause requires workup. Discussed with patient that we need to rule out endometrial cancer and we are hoping to find the more likely cause of the bleeding such as fibroid.  - Transvaginal U/S ordered today; scheduled for Friday.

## 2019-01-27 NOTE — Assessment & Plan Note (Signed)
Unlikely to be UTI as patient had resolution of her pain on urination quickly. Her U/A from urgent care was only positive for trace leuks, urine cx collected at urgent care suggested recollection - repeat U/A normal here; recollect urine cx and will follow up with results

## 2019-01-28 ENCOUNTER — Other Ambulatory Visit: Payer: Medicaid Other

## 2019-01-28 LAB — URINE CULTURE: Organism ID, Bacteria: NO GROWTH

## 2019-01-29 ENCOUNTER — Other Ambulatory Visit: Payer: Medicaid Other

## 2019-01-30 ENCOUNTER — Telehealth: Payer: Self-pay | Admitting: Family Medicine

## 2019-01-30 ENCOUNTER — Other Ambulatory Visit: Payer: Medicaid Other

## 2019-01-30 NOTE — Telephone Encounter (Signed)
Will forward to Dr. Garlan Fillers who saw patient for this visit on Monday.  Quynh Basso,CMA

## 2019-01-30 NOTE — Telephone Encounter (Signed)
Patient is still waiting for test results from labwork done this Monday.  Please call her asap with these results today if possible.  Best contact number is [phone].

## 2019-02-02 NOTE — Telephone Encounter (Signed)
Pt informed. Deseree Blount, CMA  

## 2019-02-02 NOTE — ED Provider Notes (Signed)
[facility]    CSN: [id number] Arrival date & time: 01/25/19  1026      History   Chief Complaint Chief Complaint  Patient presents with  . Back Pain    HPI Rita Bass is a 53 y.o. female.   Patient has 2-day history of low back pain.  Reports that she had some dysuria about 1 week ago and took Azo and that has resolved.  She denies further dysuria.  The pain radiates across her low back but there are no radicular symptoms.  She denies any injury or trauma.  HPI  Past Medical History:  Diagnosis Date  . Arthritis   . Asthma   . Chronic pain of left knee   . Complication of anesthesia   . GERD (gastroesophageal reflux disease)   . H/O bronchitis   . Kidney stones   . Obesity   . Renal disorder   . Shortness of breath dyspnea   . Sleep apnea in adult    Polysomnogram pending.  Followed by Dr. Craige CottaSood    Patient Active Problem List   Diagnosis Date Noted  . Vaginal bleeding 01/27/2019  . Dysuria 01/27/2019  . Low back pain 01/27/2019  . History of umbilical hernia repair 12/22/2018  . Abdominal cramping 09/15/2018  . Medication refill 12/18/2017  . Unilateral primary osteoarthritis, right knee 12/05/2017  . Body mass index 50.0-59.9, adult (HCC) 12/05/2017  . Diastolic dysfunction with heart failure (HCC) 10/25/2017  . 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 Screen for colon cancer 10/25/2017  . Insomnia 10/25/2017  . Diabetic nephropathy associated with type 2 diabetes mellitus (HCC) 10/25/2017  . Type 2 diabetes mellitus with complication (HCC) 09/13/2017  . At risk for acute ischemic cardiac event 08/16/2017  . Sleep apnea 08/16/2017  . Gastroesophageal reflux disease 08/16/2017  . Chronic pain of right knee 07/13/2016  . Morbid obesity (HCC) 02/04/2015  . Menorrhagia with regular cycle 11/18/2014    Past Surgical History:  Procedure Laterality Date  . CESAREAN SECTION     6 c-sections  . CHOLECYSTECTOMY    . HERNIA REPAIR    . KIDNEY STONE  SURGERY    . OOPHORECTOMY    . TRANSTHORACIC ECHOCARDIOGRAM  09/2017   Technically difficult study.  Did not use Definity contrast.  EF was 55 to 60% with moderate LVH and grade 1 diastolic dysfunction.  No significant valvular lesions noted.  No regional wall motion normality but difficult to assess due to poor imaging.   . TUBAL LIGATION      OB History    Gravida  6   Para  6   Term  5   Preterm  1   AB  0   Living  7     SAB  0   TAB  0   Ectopic  0   Multiple  2   Live Births               Home Medications    Prior to Admission medications   Medication Sig Start Date End Date Taking? Authorizing Provider  ACCU-CHEK SOFTCLIX LANCETS lancets Use as instructed 11/14/17  Yes Diallo, Abdoulaye, MD  albuterol (VENTOLIN HFA) 108 (90 Base) MCG/ACT inhaler Inhale 1-2 puffs into the lungs every 6 (six) hours as needed for wheezing or shortness of breath. 09/15/18  Yes Garnette Gunnerhompson, Aaron B, MD  atorvastatin (LIPITOR) 40 MG tablet Take 1 tablet (40 mg total) by mouth daily. 12/22/18  Yes Garnette Gunnerhompson, Aaron B, MD  DULoxetine (CYMBALTA) 60 MG capsule Take 2 capsules (120 mg total) by mouth daily. 03/10/18  Yes Bonnita Hollow, MD  empagliflozin (JARDIANCE) 25 MG TABS tablet Take 25 mg by mouth daily. 12/18/17  Yes Bonnita Hollow, MD  glucose blood (ACCU-CHEK AVIVA) test strip Use as instructed 11/14/17  Yes Diallo, Abdoulaye, MD  Insulin Glargine (LANTUS) 100 UNIT/ML Solostar Pen Inject 44 Units into the skin daily. Take after largest meal. 05/29/18  Yes Hensel, Jamal Collin, MD  Insulin Pen Needle (PEN NEEDLES 29GX1/2") 29G X 12MM MISC Use to inject Victoza once daily 10/11/17  Yes Hensel, Jamal Collin, MD  liraglutide (VICTOZA) 18 MG/3ML SOPN Inject 0.3 mLs (1.8 mg total) into the skin daily. 12/02/17  Yes Hensel, Jamal Collin, MD  lisinopril (PRINIVIL,ZESTRIL) 10 MG tablet Take 1 tablet (10 mg total) by mouth daily. 10/25/17  Yes Bonnita Hollow, MD  metFORMIN (GLUCOPHAGE) 500 MG tablet  Take 2 tablets (1,000 mg total) by mouth 2 (two) times daily with a meal. 11/05/18  Yes Bonnita Hollow, MD  mometasone-formoterol (DULERA) 100-5 MCG/ACT AERO Inhale 2 puffs into the lungs daily. 09/15/18  Yes Bonnita Hollow, MD  omeprazole (PRILOSEC) 40 MG capsule Take 1 capsule (40 mg total) by mouth daily. 12/22/18  Yes Bonnita Hollow, MD  ROZEREM 8 MG tablet TAKE 1 TABLET(8 MG) BY MOUTH AT BEDTIME 11/25/17  Yes Bonnita Hollow, MD  tiotropium (SPIRIVA HANDIHALER) 18 MCG inhalation capsule Place 1 capsule (18 mcg total) into inhaler and inhale daily. Inhale two times, once a day 09/13/17  Yes Bonnita Hollow, MD  acetaminophen (TYLENOL 8 HOUR) 650 MG CR tablet Take 1 tablet (650 mg total) by mouth every 8 (eight) hours as needed for pain. 12/16/17   Bonnita Hollow, MD  cyclobenzaprine (FLEXERIL) 10 MG tablet Take 1 tablet (10 mg total) by mouth 2 (two) times daily as needed for muscle spasms. 01/25/19   Wardell Honour, MD  diclofenac (VOLTAREN) 75 MG EC tablet Take 1 tablet (75 mg total) by mouth 2 (two) times daily. 03/10/18   Bonnita Hollow, MD  diclofenac sodium (VOLTAREN) 1 % GEL Apply 4 g topically 4 (four) times daily. 03/10/18   Bonnita Hollow, MD  dicyclomine (BENTYL) 10 MG capsule Take 1 capsule (10 mg total) by mouth 4 (four) times daily -  before meals and at bedtime. 12/22/18 01/21/19  Bonnita Hollow, MD  nystatin (MYCOSTATIN/NYSTOP) powder Apply topically 4 (four) times daily as needed. 11/05/18   Bonnita Hollow, MD  simethicone (GAS-X) 80 MG chewable tablet Chew 1 tablet (80 mg total) by mouth every 6 (six) hours as needed for flatulence. 09/15/18   Bonnita Hollow, MD  traMADol (ULTRAM) 50 MG tablet Take 2 tablets (100 mg total) by mouth every 6 (six) hours as needed for moderate pain. 12/22/18   Bonnita Hollow, MD    Family History Family History  Problem Relation Age of Onset  . Breast cancer Mother   . Other Father        committed suicide  . Heart  attack Maternal Aunt     Social History Social History   Tobacco Use  . Smoking status: Never Smoker  . Smokeless tobacco: Never Used  Substance Use Topics  . Alcohol use: No  . Drug use: No     Allergies   Patient has no known allergies.   Review of Systems Review of Systems  Genitourinary: Positive for dysuria.  Musculoskeletal:  Positive for back pain.  All other systems reviewed and are negative.    Physical Exam Triage Vital Signs ED Triage Vitals  Enc Vitals Group     BP 01/25/19 1041 (!) 145/87     Pulse Rate 01/25/19 1041 85     Resp 01/25/19 1041 18     Temp 01/25/19 1041 98 F (36.7 C)     Temp Source 01/25/19 1041 Oral     SpO2 01/25/19 1041 97 %     Weight --      Height --      Head Circumference --      Peak Flow --      Pain Score 01/25/19 1038 8     Pain Loc --      Pain Edu? --      Excl. in GC? --    No data found.  Updated Vital Signs BP (!) 145/87 (BP Location: Right Wrist)   Pulse 85   Temp 98 F (36.7 C) (Oral)   Resp 18   SpO2 97%   Visual Acuity Right Eye Distance:   Left Eye Distance:   Bilateral Distance:    Right Eye Near:   Left Eye Near:    Bilateral Near:     Physical Exam Vitals signs and nursing note reviewed.  Constitutional:      Appearance: Normal appearance.  HENT:     Head: Normocephalic.  Cardiovascular:     Rate and Rhythm: Normal rate and regular rhythm.  Pulmonary:     Effort: Pulmonary effort is normal.     Breath sounds: Normal breath sounds.  Abdominal:     General: Abdomen is flat. Bowel sounds are normal.  Musculoskeletal:     Comments: Back: Normal range of motion Straight leg raising is negative Deep tendon reflexes are symmetric Normal gait and heel and toe walking  Neurological:     Mental Status: She is alert.   Urinalysis has trace leukocytes   UC Treatments / Results  Labs (all labs ordered are listed, but only abnormal results are displayed) Labs Reviewed  URINE CULTURE -  Abnormal; Notable for the following components:      Result Value   Culture   (*)    Value: 10,000 COLONIES/mL MULTIPLE SPECIES PRESENT, SUGGEST RECOLLECTION   All other components within normal limits  POCT URINALYSIS DIP (DEVICE) - Abnormal; Notable for the following components:   Leukocytes,Ua TRACE (*)    All other components within normal limits    EKG   Radiology No results found.  Procedures Procedures (including critical care time)  Medications Ordered in UC Medications - No data to display  Initial Impression / Assessment and Plan / UC Course  I have reviewed the triage vital signs and the nursing notes.  Pertinent labs & imaging results that were available during my care of the patient were reviewed by me and considered in my medical decision making (see chart for details).    Low back pain, probably musculoskeletal  Final Clinical Impressions(s) / UC Diagnoses   Final diagnoses:  Acute bilateral low back pain without sciatica   Discharge Instructions   None    ED Prescriptions    Medication Sig Dispense Auth. Provider   cyclobenzaprine (FLEXERIL) 10 MG tablet Take 1 tablet (10 mg total) by mouth 2 (two) times daily as needed for muscle spasms. 20 tablet Frederica Kuster, MD     PDMP not reviewed this encounter.   Frederica Kuster, MD  02/02/19 1611  

## 2019-02-04 ENCOUNTER — Other Ambulatory Visit: Payer: Medicaid Other

## 2019-03-24 ENCOUNTER — Other Ambulatory Visit: Payer: Self-pay

## 2019-03-24 DIAGNOSIS — G8929 Other chronic pain: Secondary | ICD-10-CM

## 2019-03-24 DIAGNOSIS — J984 Other disorders of lung: Secondary | ICD-10-CM

## 2019-03-24 MED ORDER — MOMETASONE FURO-FORMOTEROL FUM 100-5 MCG/ACT IN AERO: 2.0000 | INHALATION_SPRAY | Freq: Every day | RESPIRATORY_TRACT | 2 refills | Status: DC

## 2019-03-24 MED ORDER — NYSTATIN 100000 UNIT/GM EX POWD: Freq: Four times a day (QID) | CUTANEOUS | 2 refills | Status: DC | PRN

## 2019-03-24 MED ORDER — SPIRIVA HANDIHALER 18 MCG IN CAPS: 18.0000 ug | ORAL_CAPSULE | Freq: Every day | RESPIRATORY_TRACT | 12 refills | Status: DC

## 2019-03-24 NOTE — Telephone Encounter (Signed)
Will refill meds except tramadol. Patient with chronic, but intermittant use. Patient needs to follow up to discuss tramadol refill.

## 2019-04-01 ENCOUNTER — Other Ambulatory Visit: Payer: Self-pay | Admitting: Family Medicine

## 2019-04-01 DIAGNOSIS — R109 Unspecified abdominal pain: Secondary | ICD-10-CM

## 2019-04-01 DIAGNOSIS — R140 Abdominal distension (gaseous): Secondary | ICD-10-CM

## 2019-04-07 ENCOUNTER — Ambulatory Visit (INDEPENDENT_AMBULATORY_CARE_PROVIDER_SITE_OTHER): Payer: Medicaid Other | Admitting: Family Medicine

## 2019-04-07 ENCOUNTER — Other Ambulatory Visit: Payer: Self-pay

## 2019-04-07 ENCOUNTER — Encounter: Payer: Self-pay | Admitting: Family Medicine

## 2019-04-07 VITALS — BP 122/80 | HR 87 | Wt 317.4 lb

## 2019-04-07 DIAGNOSIS — G8929 Other chronic pain: Secondary | ICD-10-CM | POA: Diagnosis not present

## 2019-04-07 DIAGNOSIS — R0609 Other forms of dyspnea: Secondary | ICD-10-CM

## 2019-04-07 DIAGNOSIS — R0600 Dyspnea, unspecified: Secondary | ICD-10-CM | POA: Diagnosis not present

## 2019-04-07 DIAGNOSIS — I10 Essential (primary) hypertension: Secondary | ICD-10-CM | POA: Diagnosis not present

## 2019-04-07 DIAGNOSIS — M25561 Pain in right knee: Secondary | ICD-10-CM | POA: Diagnosis not present

## 2019-04-07 DIAGNOSIS — E118 Type 2 diabetes mellitus with unspecified complications: Secondary | ICD-10-CM

## 2019-04-07 LAB — POCT GLYCOSYLATED HEMOGLOBIN (HGB A1C): HbA1c, POC (controlled diabetic range): 7.8 % — AB (ref 0.0–7.0)

## 2019-04-07 MED ORDER — TRAMADOL HCL 50 MG PO TABS: 100.0000 mg | ORAL_TABLET | Freq: Four times a day (QID) | ORAL | 0 refills | Status: DC | PRN

## 2019-04-07 MED ORDER — JARDIANCE 25 MG PO TABS: 25.0000 mg | ORAL_TABLET | Freq: Every day | ORAL | 6 refills | Status: DC

## 2019-04-07 MED ORDER — ACCU-CHEK SOFTCLIX LANCETS MISC: 12 refills | Status: DC

## 2019-04-07 MED ORDER — METFORMIN HCL 500 MG PO TABS: 1000.0000 mg | ORAL_TABLET | Freq: Two times a day (BID) | ORAL | 3 refills | Status: DC

## 2019-04-07 NOTE — Assessment & Plan Note (Signed)
Currently stable.  We will continue tramadol, and other medications as listed above.  Will place referral to physical therapy.  Patient follow-up in 3 months for recheck, or as needed.

## 2019-04-07 NOTE — Progress Notes (Signed)
Subjective:  Rita Bass is a 54 y.o. female who presents to the [facility] today with a chief complaint of chronic right knee pain and diabetes follow-up.   HPI:  Diabetes and hypertension follow-up Patient A1c is improved today at 7.8 down from 8.1 about 4 months ago.  Patient has been working on her diet and her weight is down approximately 7 pounds.  She has been compliant on her Jardiance 25 mg daily, Metformin as milligrams twice daily, Victoza 1.8 mg injection daily, Lantus 44 units daily.  Denies symptoms of hypoglycemia, polyuria, polydipsia.  Patient is on an ARB for renal protection and for hypertension management.  Pressure is at goal.  She is prefer to the optometrist for diabetes retina check, but has not yet gone.  Patient on moderate intensity statin.  Chronic right knee pain secondary to OA Patient with chronic right knee pain secondary to OA.  She reports that her pain is well controlled on her current regimen which is Tylenol 650 mg as needed, diclofenac gel, Cymbalta 120 mg daily, and tramadol as needed.  Patient's last fill of tramadol was on October 2020.  She uses it once every week or so.  The pain is not worsening.  Discussed alternative options, recommended trial of physical therapy which patient agreed to try.  ROS: Per HPI  PMH: Smoking history reviewed.    Objective:  Physical Exam: BP 122/80   Pulse 87   Wt (!) 317 lb 6.4 oz (144 kg)   SpO2 98%   BMI 56.22 kg/m   Gen: NAD, resting comfortably CV: RRR with no murmurs appreciated Pulm: NWOB, CTAB with no crackles, wheezes, or rhonchi GI: Normal bowel sounds present. Soft, Nontender, Nondistended. MSK: Bilateral knees without any gross deformity, swelling, bruising, normal range of motion, nontender to palpation, Skin: warm, dry Neuro: grossly normal, moves all extremities Psych: Normal affect and thought content  Results for orders placed or performed in visit on 04/07/19 (from the past 72  hour(s))  HgB A1c     Status: Abnormal   Collection Time: 04/07/19  3:05 PM  Result Value Ref Range   Hemoglobin A1C     HbA1c POC (<> result, manual entry)     HbA1c, POC (prediabetic range)     HbA1c, POC (controlled diabetic range) 7.8 (A) 0.0 - 7.0 %     Assessment/Plan:  Type 2 diabetes mellitus with complication ([place]) Diabetes continues to slowly improve.  Weight trending down.  Recommend continued current regimen and patient focus on diet and exercise.  Chronic pain of right knee Currently stable.  We will continue tramadol, and other medications as listed above.  Will place referral to physical therapy.  Patient follow-up in 3 months for recheck, or as needed.  Essential hypertension BP well controlled on current regimen.  Continue lisinopril 10.  Patient follow-up BP check in 3 to 6 months.  Medications refilled as requested.  Lab Orders     HgB A1c  Meds ordered this encounter  Medications  . traMADol (ULTRAM) 50 MG tablet    Sig: Take 2 tablets (100 mg total) by mouth every 6 (six) hours as needed for moderate pain.    Dispense:  30 tablet    Refill:  0  . empagliflozin (JARDIANCE) 25 MG TABS tablet    Sig: Take 25 mg by mouth daily.    Dispense:  30 tablet    Refill:  6  . metFORMIN (GLUCOPHAGE) 500 MG tablet  Sig: Take 2 tablets (1,000 mg total) by mouth 2 (two) times daily with a meal.    Dispense:  180 tablet    Refill:  3  . Accu-Chek Softclix Lancets lancets    Sig: Use as instructed    Dispense:  100 each    Refill:  12    Dispense for two times daily testing.      Thomes Dinning, MD, MS FAMILY MEDICINE RESIDENT - PGY3 04/07/2019 3:46 PM

## 2019-04-07 NOTE — Patient Instructions (Signed)
It was a pleasure to see you today! Thank you for choosing [facility] for your primary care. Rita Bass was seen for diabetes management and right knee pain.   1. For your diabetes, you A1C was better today at 7.8. We are continuing your Lantus 44 units, Jardiance, Victoza, and Metformin.  Please continue to work on diet and exercise.  Follow-up with [name] in 3 to 6 months for evaluation of your A1c.  2. For your knee pain, continue the tylenol, diclofenac gel, and gabapentin. You may use tramadol as needed for breakthrough pain. We have referred you to physical therapy.   3. Your blood pressure is well controlled on your current lisinopril.   Best,  Thomes Dinning, MD, MS FAMILY MEDICINE RESIDENT - PGY3 04/07/2019 3:07 PM

## 2019-04-07 NOTE — Assessment & Plan Note (Signed)
BP well controlled on current regimen.  Continue lisinopril 10.  Patient follow-up BP check in 3 to 6 months.

## 2019-04-07 NOTE — Assessment & Plan Note (Signed)
Diabetes continues to slowly improve.  Weight trending down.  Recommend continued current regimen and patient focus on diet and exercise.

## 2019-04-15 IMAGING — CR DG KNEE COMPLETE 4+V*R*
4 series · 4 of 4 positions shown · non-contrast
Comparison: Right knee series 05/08/2013.

CLINICAL DATA: 52-year-old female with right knee pain for 1 year,
worse at night.

EXAM:
RIGHT KNEE - COMPLETE 4+ VIEW

[w knee ap right]
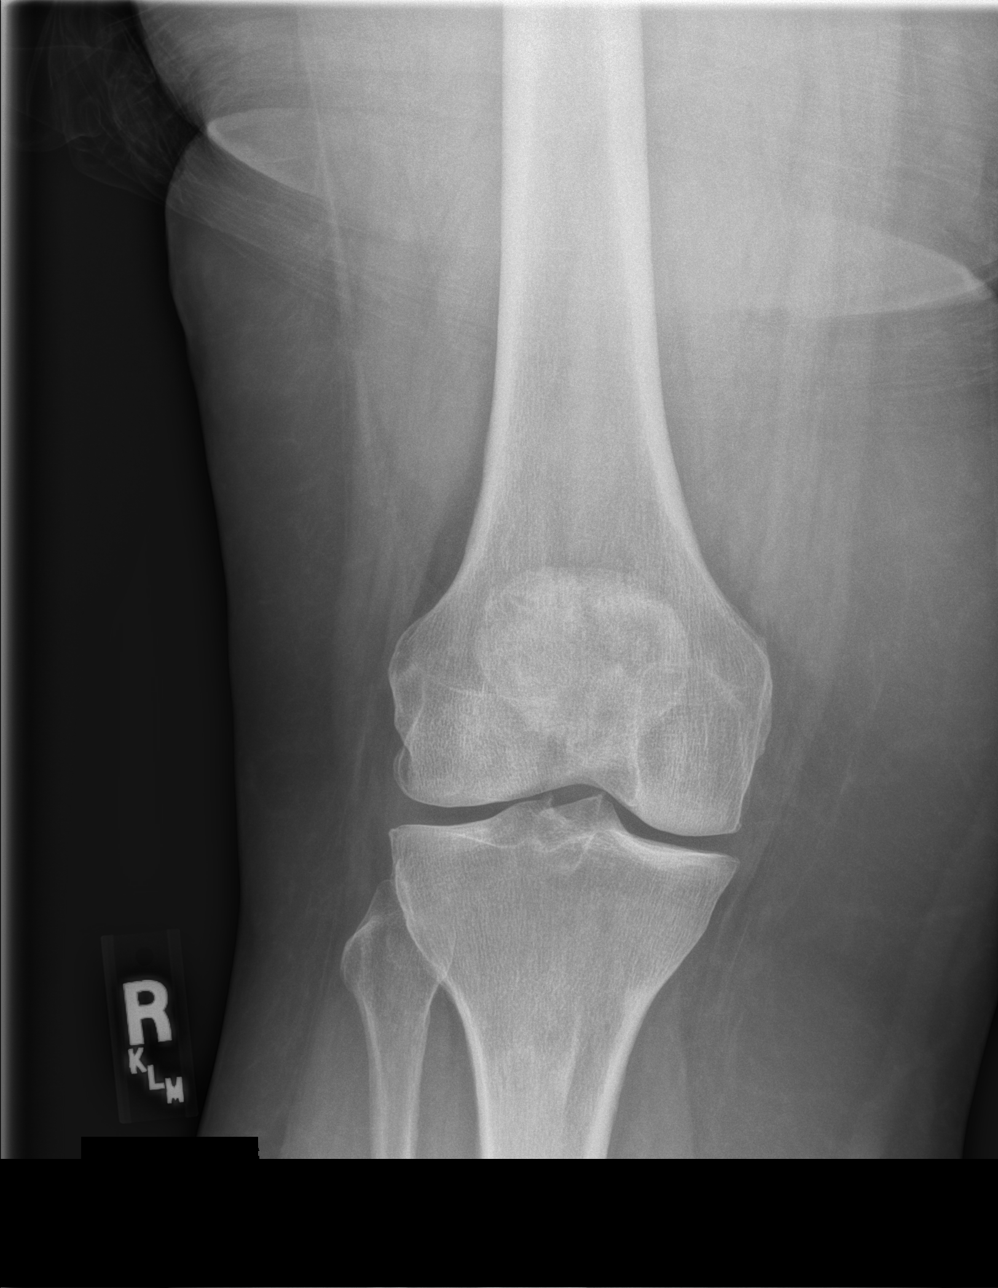

[w knee lat right]
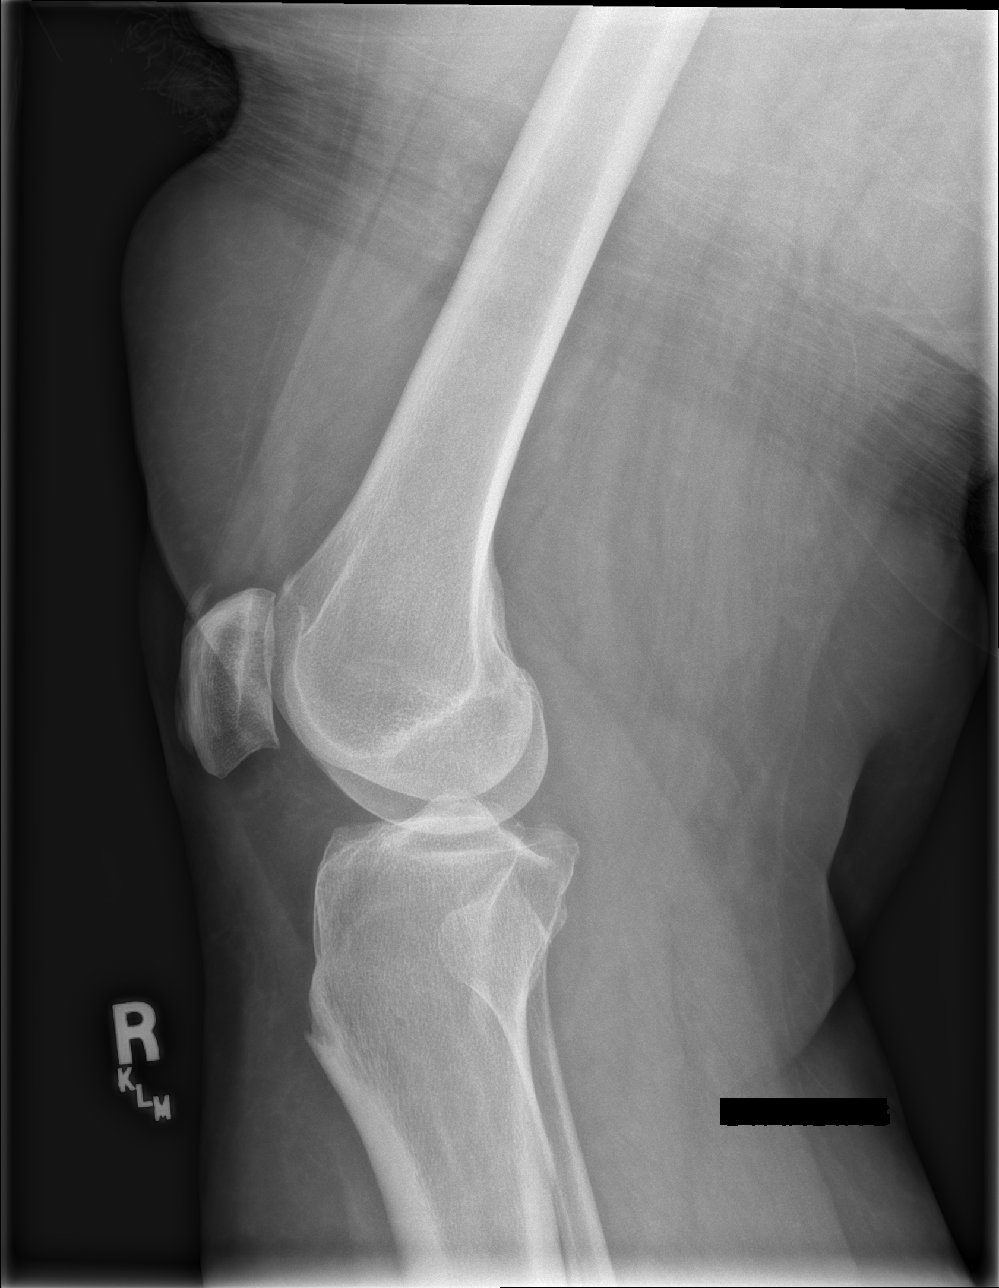

[w knee tunnel pa right]
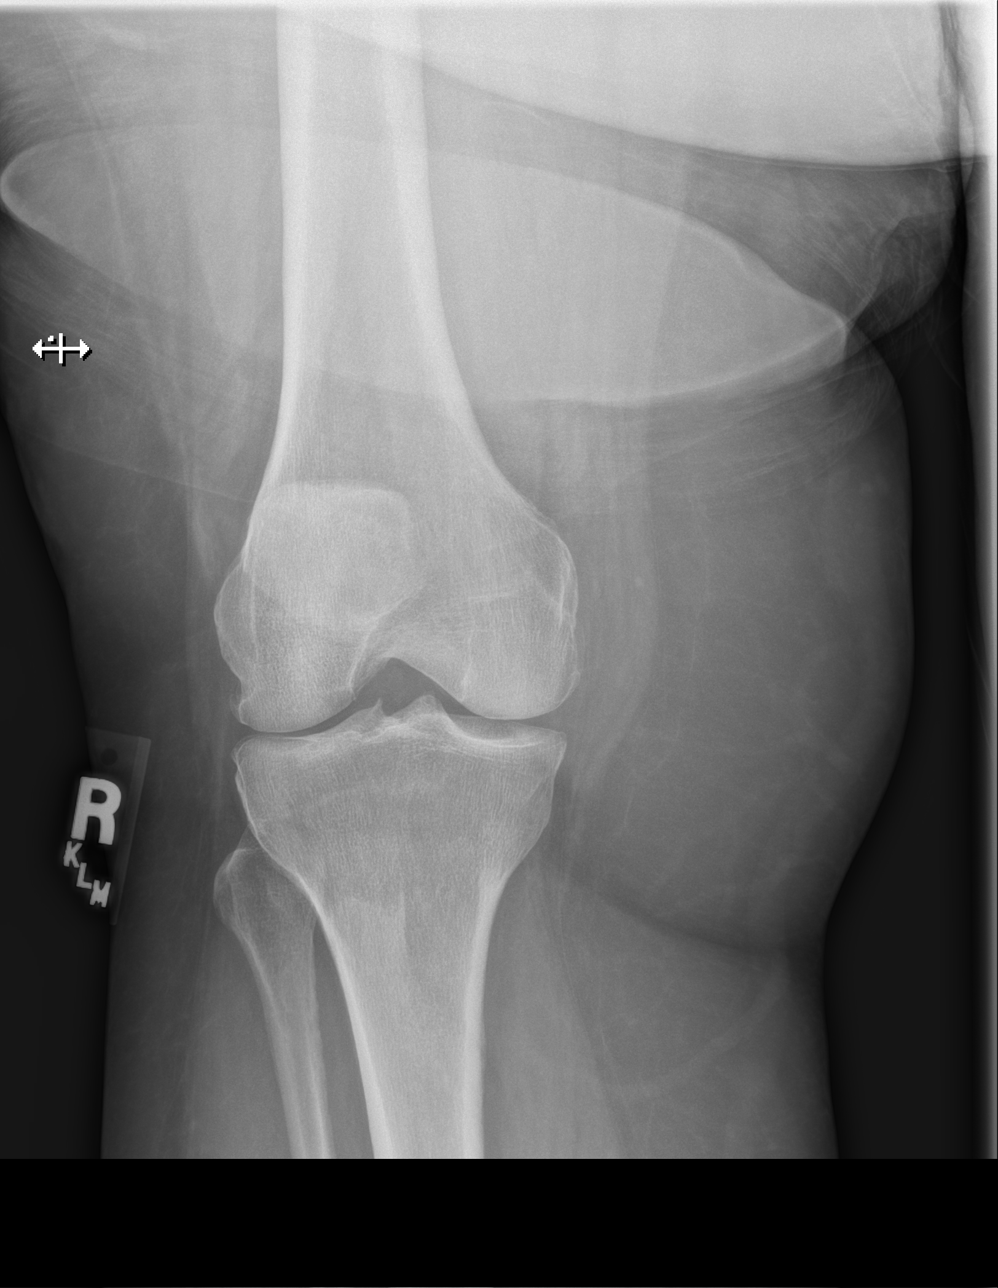

[x knee sunrise right]
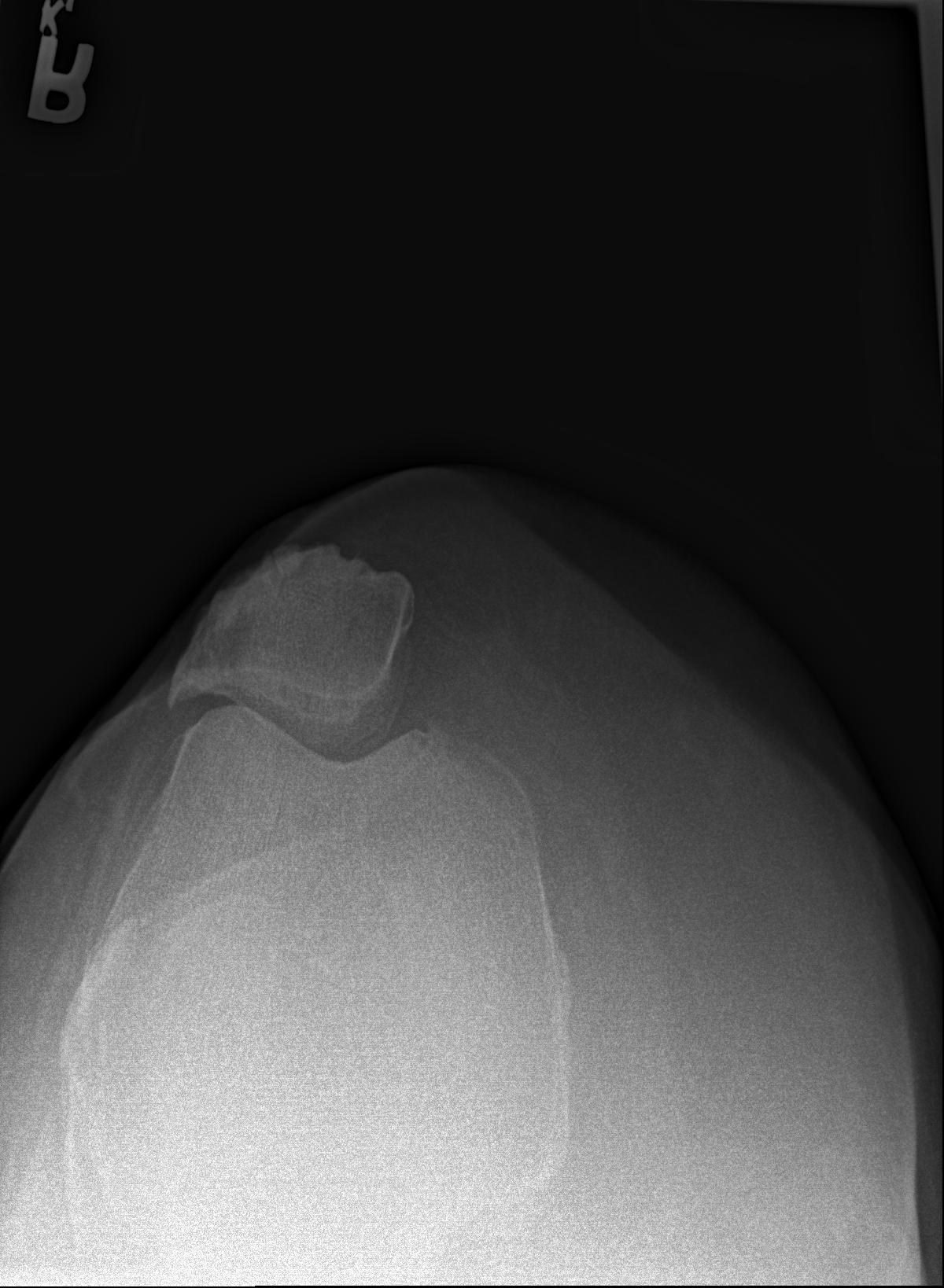

[4 of 4 positions shown; findings below may reference images not displayed]

FINDINGS: Weightbearing views. Stable and preserved joint spaces in the right
knee. Moderate patellofemoral and mild medial and lateral
compartment degenerative spurring does appear increased since 0582.
No definite joint effusion. Bone mineralization is within normal
limits. No acute osseous abnormality identified.
IMPRESSION: 1. Progressed tricompartmental degenerative spurring since 0582,
worst in the patellofemoral compartment.
2. Relatively preserved joint spaces and no acute osseous
abnormality identified.

## 2019-05-13 ENCOUNTER — Ambulatory Visit: Payer: Medicaid Other | Admitting: Physical Therapy

## 2019-05-19 ENCOUNTER — Ambulatory Visit (INDEPENDENT_AMBULATORY_CARE_PROVIDER_SITE_OTHER): Payer: Medicaid Other | Admitting: Family Medicine

## 2019-05-19 ENCOUNTER — Other Ambulatory Visit: Payer: Self-pay

## 2019-05-19 ENCOUNTER — Encounter: Payer: Self-pay | Admitting: Family Medicine

## 2019-05-19 VITALS — BP 122/78 | HR 86 | Ht 64.0 in | Wt 314.1 lb

## 2019-05-19 DIAGNOSIS — R1013 Epigastric pain: Secondary | ICD-10-CM

## 2019-05-19 NOTE — Patient Instructions (Signed)
It was a pleasure to see you today! Thank you for choosing [facility] for your primary care. Rita Bass was seen for abdominal dyspepsia.   We are referring you to the GI doctor for further work up.   Best,  Thomes Dinning, MD, MS FAMILY MEDICINE RESIDENT - PGY3 05/19/2019 9:47 AM

## 2019-05-22 DIAGNOSIS — R1013 Epigastric pain: Secondary | ICD-10-CM | POA: Insufficient documentation

## 2019-05-22 NOTE — Progress Notes (Signed)
    SUBJECTIVE:   CHIEF COMPLAINT / HPI:   Patient presenting for follow-up for chronic dyspepsia and abdominal bloating.  Patient has been trialed on multiple medications including PPI, simethicone, Bentyl.  Patient really denies much abdominal pain.  It is not postprandial.  Denies any symptoms of heartburn.  Tolerating p.o. well.  Has no trouble swallowing solids or liquids.  Denies hematochezia, vomiting, diarrhea, constipation, hematemesis.  PERTINENT  PMH / PSH:  As above  OBJECTIVE:   BP 122/78   Pulse 86   Ht 5\' 4"  (1.626 m)   Wt (!) 314 lb 2 oz (142.5 kg)   SpO2 99%   BMI 53.92 kg/m   Gen: NAD, resting comfortably CV: RRR with no murmurs appreciated Pulm: NWOB, CTAB with no crackles, wheezes, or rhonchi GI: Soft, Nontender, Nondistended. MSK: no edema, cyanosis, or clubbing noted Skin: warm, dry Neuro: grossly normal, moves all extremities Psych: Normal affect and thought content   ASSESSMENT/PLAN:   No problem-specific Assessment & Plan notes found for this encounter.     , MD [facility]

## 2019-05-22 NOTE — Assessment & Plan Note (Signed)
Patient with abdominal dyspepsia.  Tried on multiple medications without improvement. -Referral to GI for further work-up. -Patient also needs her colonoscopy.  Perhaps he could do an EGD and colonoscopy simultaneously.

## 2019-05-25 ENCOUNTER — Ambulatory Visit: Payer: Medicaid Other | Attending: Family Medicine

## 2019-07-06 ENCOUNTER — Encounter: Payer: Self-pay | Admitting: Family Medicine

## 2019-07-28 ENCOUNTER — Telehealth: Payer: Self-pay

## 2019-07-28 DIAGNOSIS — Z1211 Encounter for screening for malignant neoplasm of colon: Secondary | ICD-10-CM

## 2019-07-28 NOTE — Telephone Encounter (Signed)
-----   Message from Robert L Busick, CMA sent at 07/24/2019  9:55 AM EDT ----- Regarding: Cologuard Cologuard due  

## 2019-07-28 NOTE — Telephone Encounter (Signed)
Patient was called with referral for GI, but had not returned calls to schedule. Referral replaced.

## 2019-07-28 NOTE — Addendum Note (Signed)
Addended by: Garnette Gunner on: 07/28/2019 03:39 PM   Modules accepted: Orders

## 2019-07-29 NOTE — Telephone Encounter (Signed)
LM for patient letting her know of message from MD.  Burnard Hawthorne

## 2019-09-18 ENCOUNTER — Other Ambulatory Visit: Payer: Self-pay

## 2019-09-18 ENCOUNTER — Ambulatory Visit (INDEPENDENT_AMBULATORY_CARE_PROVIDER_SITE_OTHER): Payer: Medicaid Other | Admitting: Family Medicine

## 2019-09-18 VITALS — BP 126/82 | HR 84 | Ht 64.0 in | Wt 304.0 lb

## 2019-09-18 DIAGNOSIS — K219 Gastro-esophageal reflux disease without esophagitis: Secondary | ICD-10-CM

## 2019-09-18 DIAGNOSIS — M545 Low back pain, unspecified: Secondary | ICD-10-CM

## 2019-09-18 DIAGNOSIS — J069 Acute upper respiratory infection, unspecified: Secondary | ICD-10-CM | POA: Diagnosis not present

## 2019-09-18 DIAGNOSIS — R140 Abdominal distension (gaseous): Secondary | ICD-10-CM | POA: Diagnosis not present

## 2019-09-18 DIAGNOSIS — E118 Type 2 diabetes mellitus with unspecified complications: Secondary | ICD-10-CM | POA: Diagnosis not present

## 2019-09-18 DIAGNOSIS — R109 Unspecified abdominal pain: Secondary | ICD-10-CM | POA: Diagnosis not present

## 2019-09-18 DIAGNOSIS — J984 Other disorders of lung: Secondary | ICD-10-CM

## 2019-09-18 LAB — POCT UA - MICROSCOPIC ONLY: Epithelial cells, urine per micros: >20

## 2019-09-18 LAB — POCT URINALYSIS DIP (MANUAL ENTRY)
Blood, UA: NEGATIVE
Glucose, UA: NEGATIVE mg/dL
Ketones, POC UA: NEGATIVE mg/dL
Leukocytes, UA: NEGATIVE
Nitrite, UA: NEGATIVE
Protein Ur, POC: 30 mg/dL — AB
Spec Grav, UA: >=1.03 — AB (ref 1.010–1.025)
Urobilinogen, UA: 0.2 E.U./dL
pH, UA: 6 (ref 5.0–8.0)

## 2019-09-18 LAB — POCT GLYCOSYLATED HEMOGLOBIN (HGB A1C): HbA1c, POC (controlled diabetic range): 7.2 % — AB (ref 0.0–7.0)

## 2019-09-18 MED ORDER — CYCLOBENZAPRINE HCL 10 MG PO TABS: 10.0000 mg | ORAL_TABLET | Freq: Two times a day (BID) | ORAL | 0 refills | Status: DC | PRN

## 2019-09-18 MED ORDER — ALBUTEROL SULFATE HFA 108 (90 BASE) MCG/ACT IN AERS: 1.0000 | INHALATION_SPRAY | Freq: Four times a day (QID) | RESPIRATORY_TRACT | 2 refills | Status: DC | PRN

## 2019-09-18 MED ORDER — MOMETASONE FURO-FORMOTEROL FUM 100-5 MCG/ACT IN AERO: 2.0000 | INHALATION_SPRAY | Freq: Every day | RESPIRATORY_TRACT | 2 refills | Status: DC

## 2019-09-18 MED ORDER — DICYCLOMINE HCL 10 MG PO CAPS: ORAL_CAPSULE | ORAL | 0 refills | Status: DC

## 2019-09-18 MED ORDER — OMEPRAZOLE 40 MG PO CPDR: 40.0000 mg | DELAYED_RELEASE_CAPSULE | Freq: Every day | ORAL | 3 refills | Status: DC

## 2019-09-18 NOTE — Progress Notes (Signed)
    SUBJECTIVE:   CHIEF COMPLAINT / HPI:   Right lower back pain  Pain has been there for several days, almost a week. Pain in lower back is constant. No worsening of pain. No trauma. No issues walking. Hurts most when she stands up. No weakness or tingling in her legs. No bowel or bladder incontinence.   Pain on right Flank  Pain also started att he same time. Sharp pain on right flank that goes to anterior right pelvis. No pain with urination. History of kidney stones. No fevers, chills, nausea, vomiting. No diarrhea, abdominal pain.   Patient also requesting refills for multiple medications.  For the tramadol, I have asked her to set up an appt with her PCP as she gets this medication PRN and should be evaluated.   PERTINENT  PMH / PSH: History of kidney stones  OBJECTIVE:   BP 126/82   Pulse 84   Ht 5\' 4"  (1.626 m)   Wt (!) 304 lb (137.9 kg)   SpO2 97%   BMI 52.18 kg/m   General: Well-appearing female, no acute distress, obese Back: Lungs clear to auscultation bilaterally.  No CVA tenderness.  Tenderness to deep palpation of localized area of right flank.  Patient also has tenderness to palpation of musculature superior to iliac crest.  No skin changes appreciated.  No hematoma.  Sensation intact.  Full range of motion with trunk rotation, hip flexion, abduction, abduction, extension.  ASSESSMENT/PLAN:   Right flank pain Patient with history of kidney stones.  Patient's urine came back without any red blood cells or signs of infection.  No CVA tenderness, no fever today.  Will treat as kidney stone with conservative management.  Patient declines Flomax at this time.  Continue to monitor.  Return precautions provided.  Low back pain Patient reporting low back pain on right lower back without any evidence or history of trauma.  Physical exam is consistent with muscular strain.  Will treat with Flexeril as needed and conservative management.      , MD [facility]

## 2019-09-18 NOTE — Patient Instructions (Signed)
I have refilled your medications as requested today.  I would like you to come back to see Dr. Vincente Poli for tramadol prescription for your lower legs. I have refilled your Flexeril for your lower back as I believe you have sustained a muscle spasm.  Sometimes, this can take several weeks to go away but it should improve over time.  If you have any worsening, please let us know. For the pain in your right flank, your symptoms are consistent with a kidney stone.  We will follow-up on the labs for today and I will call you if we need to start you on any medication.  Otherwise, for the kidney stone, we will just continue to monitor and wait for you to pass the stone.  If you feel like you have are having trouble passing the stone, we can start a medication called Flomax which will theoretically help you pass the stone easier as it increases the size of your ureters, where the stone is traveling to get to your bladder.  If you have any worsening, develop fevers, chills, nausea, vomiting, please go to the ED or make an immediate appointment with [name].  If you have any questions at any time of day, there is always a physician on call if you call the office number.

## 2019-09-22 DIAGNOSIS — R109 Unspecified abdominal pain: Secondary | ICD-10-CM | POA: Insufficient documentation

## 2019-09-22 NOTE — Assessment & Plan Note (Signed)
Patient with history of kidney stones.  Patient's urine came back without any red blood cells or signs of infection.  No CVA tenderness, no fever today.  Will treat as kidney stone with conservative management.  Patient declines Flomax at this time.  Continue to monitor.  Return precautions provided.

## 2019-09-22 NOTE — Assessment & Plan Note (Signed)
Patient reporting low back pain on right lower back without any evidence or history of trauma.  Physical exam is consistent with muscular strain.  Will treat with Flexeril as needed and conservative management.

## 2019-10-05 ENCOUNTER — Ambulatory Visit (INDEPENDENT_AMBULATORY_CARE_PROVIDER_SITE_OTHER): Payer: Medicaid Other | Admitting: Family Medicine

## 2019-10-05 ENCOUNTER — Other Ambulatory Visit: Payer: Self-pay

## 2019-10-05 ENCOUNTER — Ambulatory Visit: Payer: Medicaid Other | Admitting: Family Medicine

## 2019-10-05 ENCOUNTER — Encounter: Payer: Self-pay | Admitting: Family Medicine

## 2019-10-05 VITALS — BP 102/70 | HR 93 | Wt 302.4 lb

## 2019-10-05 DIAGNOSIS — E1121 Type 2 diabetes mellitus with diabetic nephropathy: Secondary | ICD-10-CM | POA: Diagnosis not present

## 2019-10-05 DIAGNOSIS — I10 Essential (primary) hypertension: Secondary | ICD-10-CM

## 2019-10-05 DIAGNOSIS — M545 Low back pain, unspecified: Secondary | ICD-10-CM

## 2019-10-05 DIAGNOSIS — M5431 Sciatica, right side: Secondary | ICD-10-CM

## 2019-10-05 DIAGNOSIS — M25561 Pain in right knee: Secondary | ICD-10-CM | POA: Diagnosis not present

## 2019-10-05 DIAGNOSIS — G8929 Other chronic pain: Secondary | ICD-10-CM

## 2019-10-05 DIAGNOSIS — E118 Type 2 diabetes mellitus with unspecified complications: Secondary | ICD-10-CM | POA: Diagnosis not present

## 2019-10-05 DIAGNOSIS — Z1211 Encounter for screening for malignant neoplasm of colon: Secondary | ICD-10-CM | POA: Diagnosis not present

## 2019-10-05 MED ORDER — TRAMADOL HCL 50 MG PO TABS: 100.0000 mg | ORAL_TABLET | Freq: Four times a day (QID) | ORAL | 0 refills | Status: DC | PRN

## 2019-10-05 MED ORDER — GABAPENTIN 100 MG PO CAPS: 100.0000 mg | ORAL_CAPSULE | Freq: Three times a day (TID) | ORAL | 3 refills | Status: DC

## 2019-10-05 NOTE — Progress Notes (Signed)
    SUBJECTIVE:   CHIEF COMPLAINT / HPI:   Follow-up-right flank pain/low back pain/right shooting leg pain Patient was previously evaluated on 7/2 for an office visit to discuss her right flank pain and low back pain.  At that time was recommended she start with Flomax as she had a history of kidney stones but a UA without signs of red blood cells or infection.  Patient declined the Flomax at that time.  Patient's low back pain was treated with Flexeril as needed as patient had no signs of red flag symptoms. Back pain improving, however patient states that she has continued shooting pain down her right leg.  Patient states this pain has gone on for some time and does not seem to be improving like the main back pain is.  Hypertension Well controlled. Home meds include lisinopril.  Current blood pressure 102/70.  Patient not checking blood pressures at home but does not have any concerns for this right now.  PERTINENT  PMH / PSH: Recently seen for right flank/low back pain and concern for renal stone at that time.  OBJECTIVE:   BP 102/70   Pulse 93   Wt (!) 302 lb 6 oz (137.2 kg)   SpO2 99%   BMI 51.90 kg/m    General: NAD, pleasant, able to participate in exam Cardiac: RRR, no murmurs. Respiratory: CTAB Abdomen: Bowel sounds present, nontender MSK: Right knee with some mild pain to palpation.  Strength 5/5 in knee extensors bilaterally.  Positive straight leg test on the right. Skin: warm and dry, no rashes noted Psych: Normal affect and mood  ASSESSMENT/PLAN:   Essential hypertension Assessment: Well-controlled blood pressure with current pressure of 102/70 today.  Home medication include lisinopril. Plan: -Continue lisinopril -We will check renal function today.  Right sciatic nerve pain Assessment: Right-sided sciatic nerve pain.  Patient states this is not really improved with Flexeril and tramadol but that the tramadol has helped some with breakthrough  pain. Plan: -Provided referral for physical therapy -Recommend pain management with acetaminophen 650 every 8 hours, gabapentin 100 mg 3 times per day with plan to increase this to 200 mg 3 times per day if it does not provide pain relief and does not make patient too sedated.  We will also refill patient's tramadol to use for breakthrough pain until she can get in with physical therapy. -If gabapentin does not help the pain at 200 mg 3 times per day can consider increasing this further.   -Provided patient referral for colonoscopy  Jackelyn Poling, DO [facility]

## 2019-10-05 NOTE — Assessment & Plan Note (Signed)
Assessment: Right-sided sciatic nerve pain.  Patient states this is not really improved with Flexeril and tramadol but that the tramadol has helped some with breakthrough pain. Plan: -Provided referral for physical therapy -Recommend pain management with acetaminophen 650 every 8 hours, gabapentin 100 mg 3 times per day with plan to increase this to 200 mg 3 times per day if it does not provide pain relief and does not make patient too sedated.  We will also refill patient's tramadol to use for breakthrough pain until she can get in with physical therapy. -If gabapentin does not help the pain at 200 mg 3 times per day can consider increasing this further.

## 2019-10-05 NOTE — Patient Instructions (Addendum)
It was great to see you!  Our plans for today:  - I would like to see you back in about 3 months to recheck your A1C - For your pain I am sending a referral for physical therapy.  I would like for you to use tylenol 650mg  every 8 hours, gabapentin 100mg  three times per day, and will send some additional tramadol to only use as needed. - If the gabapentin makes you too sleepy we can do 100mg  only at night, if it does not make you too sleepy we can increase it after 4 days to 200mg  three times per day - Stop taking the flexeril - I would like to check your labs today - I have sent a referral for colonoscopy. If you do not hear from them in 2 weeks let me know.  We are checking some labs today, I will call you if they are abnormal will send you a MyChart message or a letter if they are normal.  If you do not hear about your labs in the next 2 weeks please let know.  Take care and seek immediate care sooner if you develop any concerns.   Dr. Family Medicine

## 2019-10-05 NOTE — Assessment & Plan Note (Signed)
Assessment: Well-controlled blood pressure with current pressure of 102/70 today.  Home medication include lisinopril. Plan: -Continue lisinopril -We will check renal function today.

## 2019-10-06 ENCOUNTER — Encounter: Payer: Self-pay | Admitting: Family Medicine

## 2019-10-06 LAB — COMPREHENSIVE METABOLIC PANEL
ALT: 16 IU/L (ref 0–32)
AST: 9 IU/L (ref 0–40)
Albumin/Globulin Ratio: 1.7 (ref 1.2–2.2)
Albumin: 4.6 g/dL (ref 3.8–4.9)
Alkaline Phosphatase: 106 IU/L (ref 48–121)
BUN/Creatinine Ratio: 22 (ref 9–23)
BUN: 11 mg/dL (ref 6–24)
Bilirubin Total: 0.3 mg/dL (ref 0.0–1.2)
CO2: 27 mmol/L (ref 20–29)
Calcium: 9.6 mg/dL (ref 8.7–10.2)
Chloride: 100 mmol/L (ref 96–106)
Creatinine, Ser: 0.51 mg/dL — ABNORMAL LOW (ref 0.57–1.00)
GFR calc Af Amer: 129 mL/min/{1.73_m2} (ref >59)
GFR calc non Af Amer: 112 mL/min/{1.73_m2} (ref >59)
Globulin, Total: 2.7 g/dL (ref 1.5–4.5)
Glucose: 173 mg/dL — ABNORMAL HIGH (ref 65–99)
Potassium: 4.5 mmol/L (ref 3.5–5.2)
Sodium: 140 mmol/L (ref 134–144)
Total Protein: 7.3 g/dL (ref 6.0–8.5)

## 2019-10-08 ENCOUNTER — Encounter: Payer: Self-pay | Admitting: Gastroenterology

## 2019-11-12 ENCOUNTER — Telehealth: Payer: Self-pay | Admitting: *Deleted

## 2019-11-12 NOTE — Telephone Encounter (Signed)
Dr Russella Dar,  This pt has a referral for a direct colon - she is scheduled for 9-29 WED- No LBGI hx- she has a BMI of 51.88. she has a hx of GERD, OSA, HTN, DM on multiple meds.  Do you ant her to have an OV prior to her colon or direct at [facility]?  Please advise, thanks for your time, Hilda Lias

## 2019-11-12 NOTE — Telephone Encounter (Signed)
Called pt to discuss colon and BMI- LM to return call

## 2019-11-12 NOTE — Telephone Encounter (Signed)
Direct colonoscopy at [facility] however it might be a couple months, or potentially longer, with current Covid-19 delta variant surge. Please also offer her the option of Cologuard for average risk CRC screening which can be ordered by her PCP if she decides to do that instead of colonoscopy.

## 2019-11-13 NOTE — Telephone Encounter (Signed)
Called the patient, no answer, left a message for her to call us back.

## 2019-11-16 NOTE — Telephone Encounter (Signed)
Patient has not returned our call, I mailed her a letter.

## 2019-11-16 NOTE — Telephone Encounter (Signed)
Called patient, no answer. Left a message. Colon and PV cancelled at this time.

## 2019-12-05 ENCOUNTER — Encounter (HOSPITAL_COMMUNITY): Payer: Self-pay | Admitting: Emergency Medicine

## 2019-12-05 ENCOUNTER — Other Ambulatory Visit: Payer: Self-pay

## 2019-12-05 ENCOUNTER — Telehealth (HOSPITAL_COMMUNITY): Payer: Self-pay | Admitting: Emergency Medicine

## 2019-12-05 ENCOUNTER — Ambulatory Visit (HOSPITAL_COMMUNITY)
Admission: EM | Admit: 2019-12-05 | Discharge: 2019-12-05 | Disposition: A | Discharge status: Home / Self Care | Payer: BLUE CROSS/BLUE SHIELD | Attending: Family Medicine | Admitting: Family Medicine

## 2019-12-05 DIAGNOSIS — J209 Acute bronchitis, unspecified: Secondary | ICD-10-CM

## 2019-12-05 MED ORDER — HYDROCODONE-HOMATROPINE 5-1.5 MG/5ML PO SYRP: 5.0000 mL | ORAL_SOLUTION | Freq: Four times a day (QID) | ORAL | 0 refills | Status: DC | PRN

## 2019-12-05 MED ORDER — PREDNISONE 10 MG (21) PO TBPK: ORAL_TABLET | Freq: Every day | ORAL | 0 refills | Status: AC
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
## 2019-12-05 NOTE — Telephone Encounter (Signed)
Patient called asking about taking prednisone and cough syrup together.  Spoke to [employer], [employer] stressed medications safe to be taken together Patient agreeable.

## 2019-12-05 NOTE — Discharge Instructions (Addendum)
I have sent in a prednisone taper for you to take for 6 days. 6 tablets on day one, 5 tablets on day two, 4 tablets on day three, 3 tablets on day four, 2 tablets on day five, and 1 tablet on day six.  Also sent in Hycodan for you to use every 4 hours as needed for cough  Follow-up with primary care or with allergy and asthma as needed

## 2019-12-05 NOTE — ED Triage Notes (Signed)
Pt presents with cough xs 2 days. States has a hx of asthma. States received 1st COVID vaccine last week.   Denies congestion, fever, n,v,d, loss of taste or smell.

## 2019-12-10 ENCOUNTER — Encounter: Payer: Self-pay | Admitting: Family Medicine

## 2019-12-16 ENCOUNTER — Encounter: Payer: Medicaid Other | Admitting: Gastroenterology

## 2019-12-17 ENCOUNTER — Ambulatory Visit (INDEPENDENT_AMBULATORY_CARE_PROVIDER_SITE_OTHER): Payer: Medicaid Other | Admitting: Family Medicine

## 2019-12-17 VITALS — BP 126/72 | HR 98

## 2019-12-17 DIAGNOSIS — R05 Cough: Secondary | ICD-10-CM | POA: Diagnosis not present

## 2019-12-17 DIAGNOSIS — R053 Chronic cough: Secondary | ICD-10-CM

## 2019-12-17 DIAGNOSIS — J441 Chronic obstructive pulmonary disease with (acute) exacerbation: Secondary | ICD-10-CM | POA: Diagnosis not present

## 2019-12-17 MED ORDER — AZITHROMYCIN 250 MG PO TABS: ORAL_TABLET | ORAL | 0 refills | Status: DC

## 2019-12-17 NOTE — Patient Instructions (Signed)
It was wonderful to see you today.  Please bring ALL of your medications with you to every visit.   Today we talked about:  Your chronic cough. This may be a COPD exacerbation. Continue to use your maintenance inhalers. I have prescribed azithromycin for a total of 5 days.   We will get a chest Xray to look for other causes such as pneumonia.   Please be sure to schedule follow up at the front  desk before you leave today.   Please call the clinic at [phone] if your symptoms worsen or you have any concerns. It was our pleasure to serve you.  Dr. Salvadore Dom

## 2019-12-17 NOTE — Progress Notes (Signed)
    SUBJECTIVE:   CHIEF COMPLAINT / HPI:   Rita Bass is a 54 yo F who presents to [facility] clinic for the below issue.   Cough Experiencing cough for 2 weeks duration. Was seen in urgent care on the 18th and given prednisone and cough syrup. Cough syrup was helping but continues to have cough. She has had this cough before and is her usual seasonal cough. Has occasional clear to white sputum production. She endorses continued compliance with dulera and spiriva during this time. She denies fever, sick contacts, chest pain, and shortness of breath.   PERTINENT  PMH / PSH: CHF (Echo 09/18/2017 w/ EF 55-60%, mod LVH and G1DD); T2DM (A1c 7.2 09/18/19), GERD (omeprazole 40 mg daily; gas-X), HTN (lisinopril 10 mg daily)  OBJECTIVE:   BP 126/72   Pulse 98   SpO2 96%   General: Appears well, no acute distress. Age appropriate. Cardiac: RRR, normal heart sounds, no murmurs Respiratory: CTAB; prolonged expiratory phase, normal effort. No wheezing rales or rhonchi. Appreciable dry/mildly wet sounding cough Extremities: No edema or cyanosis.  ASSESSMENT/PLAN:   Chronic cough Acute on chronic. Patient with a history of known COPD, CHF, and GERD. Vital signs are appropriate and lung exam is unremarkable. Cough likely copd exacerbation as patient endorses this feels like her typical exacerbation. Will treat for an COPD exacerbation adding azithromycin to the prednisone received at urgent care. Will consider possible pneumonia and obtain a CXR. If cough continues with patient normal respiratory inhalers, steroids, and antibiotic treatment will consider cardiac or acid reflux causes. Could also consider medication causes such as lisinopril although this is not a new medication for this patient and she has been on it for some time. -Azithromycin x5 days -Obtain CXR -If cough continues to be persistent beyond copd tx, consider cardiac/GERD causes -F/u is sx fail to improve or worsen    Rita Jumbo,  DO [facility]

## 2019-12-18 ENCOUNTER — Ambulatory Visit
Admission: RE | Admit: 2019-12-18 | Discharge: 2019-12-18 | Disposition: A | Discharge status: Home / Self Care | Payer: Medicaid Other | Source: Ambulatory Visit | Attending: Family Medicine | Admitting: Family Medicine

## 2019-12-18 ENCOUNTER — Other Ambulatory Visit: Payer: Self-pay

## 2019-12-18 DIAGNOSIS — R059 Cough, unspecified: Secondary | ICD-10-CM | POA: Diagnosis not present

## 2019-12-18 DIAGNOSIS — R053 Chronic cough: Secondary | ICD-10-CM

## 2019-12-22 ENCOUNTER — Telehealth: Payer: Self-pay

## 2019-12-22 ENCOUNTER — Encounter: Payer: Self-pay | Admitting: Family Medicine

## 2019-12-22 NOTE — Telephone Encounter (Signed)
Patient LVM on nurse line requesting to discuss results of chest x-ray and plan of care with provider.   To PCP  Veronda Prude, RN

## 2019-12-23 ENCOUNTER — Other Ambulatory Visit: Payer: Self-pay

## 2019-12-23 ENCOUNTER — Ambulatory Visit: Payer: Medicaid Other | Admitting: Family Medicine

## 2019-12-23 ENCOUNTER — Encounter (HOSPITAL_COMMUNITY): Payer: Self-pay | Admitting: Emergency Medicine

## 2019-12-23 ENCOUNTER — Ambulatory Visit (HOSPITAL_COMMUNITY)
Admission: EM | Admit: 2019-12-23 | Discharge: 2019-12-23 | Disposition: A | Discharge status: Home / Self Care | Payer: Medicaid Other | Attending: Family Medicine | Admitting: Family Medicine

## 2019-12-23 DIAGNOSIS — J984 Other disorders of lung: Secondary | ICD-10-CM

## 2019-12-23 DIAGNOSIS — R053 Chronic cough: Secondary | ICD-10-CM | POA: Diagnosis not present

## 2019-12-23 DIAGNOSIS — K219 Gastro-esophageal reflux disease without esophagitis: Secondary | ICD-10-CM

## 2019-12-23 MED ORDER — AEROCHAMBER PLUS FLO-VU LARGE MISC
Status: AC
  Filled 2019-12-23: qty 1

## 2019-12-23 MED ORDER — MOMETASONE FURO-FORMOTEROL FUM 100-5 MCG/ACT IN AERO: 2.0000 | INHALATION_SPRAY | Freq: Every day | RESPIRATORY_TRACT | 2 refills | Status: DC

## 2019-12-23 MED ORDER — AEROCHAMBER PLUS FLO-VU MEDIUM MISC
1.0000 | Freq: Once | Status: AC
  Administered 2019-12-23: 1

## 2019-12-23 MED ORDER — OMEPRAZOLE 40 MG PO CPDR: 40.0000 mg | DELAYED_RELEASE_CAPSULE | Freq: Every day | ORAL | 1 refills | Status: DC

## 2019-12-23 NOTE — Telephone Encounter (Signed)
I think the patient would be appropriate for regular clinic. She has not had Covid19 testing but has a history of COPD and her symptoms are most likely due to this. I will defer to clinic policy but do not have a problem with patient being seen in regular clinic.

## 2019-12-23 NOTE — ED Triage Notes (Signed)
Pt presents with reoccurring cough. States she is using inhalers more than usual.
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
## 2019-12-23 NOTE — Telephone Encounter (Signed)
Called patient to discuss her recent visit and her chest x-ray results.  Chest x-ray showed no signs of pneumonia, no consolidations, no obvious signs of heart failure.  Patient states that she still has an ongoing cough and some wheezing.  She denies any fevers and states she does not feel worse than when she was previously seen in our clinic.  Patient states that she has tried to make an appointment for today but there were no spots available.  Patient denies any difficulty breathing or other the concerning symptoms at this time but states she would like to see if there is anything we can adjust with her medications after she gets evaluated to try to help with her ongoing cough.  Discussed with patient that I recommend her call our clinic back this afternoon to see if we have any openings as well as tomorrow morning as we will often have some cancellations.  Discussed in detail with patient that if she develops any shortness of breath, chest pains, difficulty breathing, or other concerning symptoms she should go to urgent care or the emergency department.  Patient voiced understanding and states she will plan to call back this afternoon to see if anything is opened up and then will also try again tomorrow.

## 2019-12-23 NOTE — Assessment & Plan Note (Addendum)
Acute on chronic. Patient with a history of known COPD, CHF, and GERD. Vital signs are appropriate and lung exam is unremarkable. Cough likely copd exacerbation as patient endorses this feels like her typical exacerbation. Will treat for an COPD exacerbation adding azithromycin to the prednisone received at urgent care. Will consider possible pneumonia and obtain a CXR. If cough continues with patient normal respiratory inhalers, steroids, and antibiotic treatment will consider cardiac or acid reflux causes. Could also consider medication causes such as lisinopril although this is not a new medication for this patient and she has been on it for some time. -Azithromycin x5 days -Obtain CXR -If cough continues to be persistent beyond copd tx, consider cardiac/GERD causes -F/u is sx fail to improve or worsen

## 2019-12-25 ENCOUNTER — Ambulatory Visit (INDEPENDENT_AMBULATORY_CARE_PROVIDER_SITE_OTHER): Payer: Medicaid Other | Admitting: Family Medicine

## 2019-12-25 VITALS — BP 128/72 | HR 74

## 2019-12-25 DIAGNOSIS — R062 Wheezing: Secondary | ICD-10-CM | POA: Diagnosis not present

## 2019-12-25 DIAGNOSIS — R053 Chronic cough: Secondary | ICD-10-CM

## 2019-12-25 DIAGNOSIS — J45998 Other asthma: Secondary | ICD-10-CM | POA: Diagnosis not present

## 2019-12-25 MED ORDER — MONTELUKAST SODIUM 10 MG PO TABS: 10.0000 mg | ORAL_TABLET | Freq: Every day | ORAL | 3 refills | Status: DC

## 2019-12-25 MED ORDER — ALBUTEROL SULFATE (2.5 MG/3ML) 0.083% IN NEBU: 2.5000 mg | INHALATION_SOLUTION | Freq: Four times a day (QID) | RESPIRATORY_TRACT | 1 refills | Status: DC | PRN

## 2019-12-25 MED ORDER — BENZONATATE 100 MG PO CAPS: 100.0000 mg | ORAL_CAPSULE | Freq: Two times a day (BID) | ORAL | 0 refills | Status: DC | PRN

## 2019-12-25 NOTE — Progress Notes (Signed)
    SUBJECTIVE:   CHIEF COMPLAINT / HPI:   Chronic cough Patient presents with cough that has been persistent for approximately 3 weeks.  She has been seen on multiple occasions for this at our practice as well as urgent care.  She recently went to urgent care for the cough and reports that they said they were concerned that she has a congestive heart failure exacerbation.  Reports that the cough is worse at night when she lays down or in the morning when she wakes up.  She has not tried any allergy medications for this.  She was recently treated for a COPD exacerbation with Zithromax and prednisone but has had minimal relief since that.  She reports that she takes her albuterol with some relief with her breathing after her coughing spells.  She has been trying throat lozenges, NyQuil, she was given some codeine cough syrup which did help.  She had a chest x-ray which was negative for any acute findings of pneumonia.  Patient was recently started on Prilosec by urgent care to rule out possible GERD causes.   OBJECTIVE:   BP 128/72   Pulse 74   SpO2 94%   General: Well-appearing 54 year old female, sitting in chair next to exam table comfortably, occasionally coughing HEENT: Erythematous nasal turbinates, mild posterior oropharynx erythema Cardiac: Regular rate and rhythm with no murmurs appreciated Respiratory: Patient has end inspiratory wheeze noted, upper airway congestion, no crackles noted at lung bases, respiratory exam patient appears euvolemic Abdomen: Soft, nontender, positive bowel sounds Extremities: Trace lower extremity edema bilaterally which patient reports is at her baseline  ASSESSMENT/PLAN:   Chronic cough Acute on chronic cough with known history of COPD, CHF, GERD.  Physical exam not consistent with CHF exacerbation.  She is recently finished COPD exacerbation treatment with mild relief.  Chest x-ray not consistent with pneumonia.  She reports the test done with  hydrocodone helped the most with cough but it immediately returned. -Patient reports she has difficulty using the inhaler and would like a nebulizer for albuterol.  Albuterol nebulizer given -Tessalon Perles given -Recommend Singulair for possible allergy because -consider medication causes as well although this is not likely -Follow-up as needed     Derrel Nip, MD [facility]

## 2019-12-25 NOTE — Patient Instructions (Addendum)
It was a pleasure meeting you today.  We have given you a nebulizer and I have sent a prescription for the albuterol to your pharmacy.  Hopefully this will help with your breathing.  I have also sent a prescription for Tessalon Perles as well as Singulair to your pharmacy.  Use the [employer] as needed and uses Singulair nightly.  A cough from a viral illness may last several weeks and should be treated symptomatically so he can also continue to try the hot tea with honey, throat lozenges.  If you notice worsening cough, swelling in your legs, difficulty breathing, please seek medical attention immediately.  I hope you have a wonderful afternoon!   Upper Respiratory Infection, Adult An upper respiratory infection (URI) affects the nose, throat, and upper air passages. URIs are caused by germs (viruses). The most common type of URI is often called "the common cold." Medicines cannot cure URIs, but you can do things at home to relieve your symptoms. URIs usually get better within 7-10 days. Follow these instructions at home: Activity  Rest as needed.  If you have a fever, stay home from work or school until your fever is gone, or until your doctor says you may return to work or school. ? You should stay home until you cannot spread the infection anymore (you are not contagious). ? Your doctor may have you wear a face mask so you have less risk of spreading the infection. Relieving symptoms  Gargle with a salt-water mixture 3-4 times a day or as needed. To make a salt-water mixture, completely dissolve -1 tsp of salt in 1 cup of warm water.  Use a cool-mist humidifier to add moisture to the air. This can help you breathe more easily. Eating and drinking   Drink enough fluid to keep your pee (urine) pale yellow.  Eat soups and other clear broths. General instructions   Take over-the-counter and prescription medicines only as told by your doctor. These include cold medicines, fever  reducers, and cough suppressants.  Do not use any products that contain nicotine or tobacco. These include cigarettes and e-cigarettes. If you need help quitting, ask your doctor.  Avoid being where people are smoking (avoid secondhand smoke).  Make sure you get regular shots and get the flu shot every year.  Keep all follow-up visits as told by your doctor. This is important. How to avoid spreading infection to others   Wash your hands often with soap and water. If you do not have soap and water, use hand sanitizer.  Avoid touching your mouth, face, eyes, or nose.  Cough or sneeze into a tissue or your sleeve or elbow. Do not cough or sneeze into your hand or into the air. Contact a doctor if:  You are getting worse, not better.  You have any of these: ? A fever. ? Chills. ? Brown or red mucus in your nose. ? Yellow or brown fluid (discharge)coming from your nose. ? Pain in your face, especially when you bend forward. ? Swollen neck glands. ? Pain with swallowing. ? White areas in the back of your throat. Get help right away if:  You have shortness of breath that gets worse.  You have very bad or constant: ? Headache. ? Ear pain. ? Pain in your forehead, behind your eyes, and over your cheekbones (sinus pain). ? Chest pain.  You have long-lasting (chronic) lung disease along with any of these: ? Wheezing. ? Long-lasting cough. ? Coughing up blood. ?  A change in your usual mucus.  You have a stiff neck.  You have changes in your: ? Vision. ? Hearing. ? Thinking. ? Mood. Summary  An upper respiratory infection (URI) is caused by a germ called a virus. The most common type of URI is often called "the common cold."  URIs usually get better within 7-10 days.  Take over-the-counter and prescription medicines only as told by your doctor. This information is not intended to replace advice given to you by your health care provider. Make sure you discuss any  questions you have with your health care provider. Document Revised: 03/13/2018 Document Reviewed: 10/26/2016 Elsevier Patient Education  2020 [employer].

## 2019-12-28 NOTE — Assessment & Plan Note (Addendum)
Acute on chronic cough with known history of COPD, CHF, GERD.  Physical exam not consistent with CHF exacerbation.  She is recently finished COPD exacerbation treatment with mild relief.  Chest x-ray not consistent with pneumonia.  She reports the test done with hydrocodone helped the most with cough but it immediately returned. -Patient reports she has difficulty using the inhaler and would like a nebulizer for albuterol.  Albuterol nebulizer given -Tessalon Perles given -Recommend Singulair for possible allergy because -consider medication causes as well although this is not likely -Follow-up as needed

## 2020-01-03 NOTE — Patient Instructions (Signed)
It was great to see you!  Our plans for today:  -We are checking a lab to look for any signs of heart failure. -I will call you when this lab comes back.  If it is elevated we will consider an echo of your heart. -If you develop any other symptoms or concerns do not hesitate to return.   Take care and seek immediate care sooner if you develop any concerns.   Dr. [facility]

## 2020-01-03 NOTE — Progress Notes (Signed)
    SUBJECTIVE:   CHIEF COMPLAINT / HPI:   Follow-up-chronic cough: Patient is a 54 year old female that presents today for follow-up after previous visit to discuss her chronic cough. Did improve after nebulizer treatment last visit. Breathing better now but sometimes has some shortness of breath when sleeping. Sleeps on 4 pillows at home, sometimes wakes up winded and has to sit up in bed. Previous Echo grade 1 diastolic dysfunction with 55-60% ejection fraction in 2019. Currently no symptoms but occasionally at night. Had a chest xray 10/2 with no acute cardiopulmonary findings.  Type 2 diabetes: Previous A1C 7.2. Current medications include Jardiance 25 mg, Lantus 44 units, Victoza 0.3 mL, Metformin 1000 mg twice daily.  Patient without concerns or complaints regarding her type 2 diabetes.  Has continue to work on weight loss and believes she has lost a decent amount of weight over the past few months.  States that in the future she would like to come off of more medications if she could.  PERTINENT  PMH / PSH: History of chronic cough  OBJECTIVE:   BP 120/80   Pulse 73   Ht 5\' 4"  (1.626 m)   Wt (!) 302 lb 8 oz (137.2 kg)   SpO2 98%   BMI 51.92 kg/m    General: NAD, pleasant, able to participate in exam Cardiac: RRR, no murmurs. Respiratory: CTAB, normal effort Abdomen: Bowel sounds present, nontender, nondistended, no hepatosplenomegaly. Extremities: No edema   ASSESSMENT/PLAN:   Orthopnea Assessment: 54 year old female with a history of cough for over a month presenting with worsening orthopnea that occurs once or twice per week.  Patient has not had an increase in number of pillows that she sleeps on, but does sleep on 4 pillows per night.  She states that sometimes she feels winded at night and when she sits up she longer feels short of breath.  Physical exam currently reassuring with no lower limb edema, lungs clear to auscultation, regular rate and rhythm.  Patient had a  recent chest x-ray performed about 2 weeks ago which showed no acute cardiopulmonary findings, this occurred in the midst of the patient's symptoms.  Patient did have an echocardiogram performed in 2019 which showed a grade 1 diastolic dysfunction and ejection fraction 55-60%.  Patient currently without symptoms. Plan: -Discussed patient with Dr. 08-24-1994 and Dr. Perley Jain -We will start with a BNP, if elevated will check a echocardiogram for signs of worsening heart failure.  Type 2 diabetes mellitus with complication (HCC) Assessment: Well-controlled type 2 diabetes with medications including Jardiance, Lantus 44 units/day, Victoza, metformin 1000 mg twice daily.  Patient's A1c stable from 7.2-7.5. Plan: -Continue current medications -Discussed with patient that if she continues to lose weight and work on good glucose control we may be able to scale off some of her medications in the future per her request. -We will recheck in about 3 months     Karen Chafe, DO [facility]    This note was prepared using Dragon voice recognition software and may include unintentional dictation errors due to the inherent limitations of voice recognition software.

## 2020-01-04 ENCOUNTER — Ambulatory Visit (INDEPENDENT_AMBULATORY_CARE_PROVIDER_SITE_OTHER): Payer: Medicaid Other | Admitting: Family Medicine

## 2020-01-04 ENCOUNTER — Other Ambulatory Visit: Payer: Self-pay

## 2020-01-04 ENCOUNTER — Encounter: Payer: Self-pay | Admitting: Family Medicine

## 2020-01-04 VITALS — BP 120/80 | HR 73 | Ht 64.0 in | Wt 302.5 lb

## 2020-01-04 DIAGNOSIS — R0601 Orthopnea: Secondary | ICD-10-CM | POA: Diagnosis not present

## 2020-01-04 DIAGNOSIS — E118 Type 2 diabetes mellitus with unspecified complications: Secondary | ICD-10-CM

## 2020-01-04 LAB — POCT GLYCOSYLATED HEMOGLOBIN (HGB A1C): HbA1c, POC (controlled diabetic range): 7.5 % — AB (ref 0.0–7.0)

## 2020-01-04 NOTE — Assessment & Plan Note (Signed)
Assessment: Well-controlled type 2 diabetes with medications including Jardiance, Lantus 44 units/day, Victoza, metformin 1000 mg twice daily.  Patient's A1c stable from 7.2-7.5. Plan: -Continue current medications -Discussed with patient that if she continues to lose weight and work on good glucose control we may be able to scale off some of her medications in the future per her request. -We will recheck in about 3 months

## 2020-01-04 NOTE — Assessment & Plan Note (Signed)
Assessment: 54 year old female with a history of cough for over a month presenting with worsening orthopnea that occurs once or twice per week.  Patient has not had an increase in number of pillows that she sleeps on, but does sleep on 4 pillows per night.  She states that sometimes she feels winded at night and when she sits up she longer feels short of breath.  Physical exam currently reassuring with no lower limb edema, lungs clear to auscultation, regular rate and rhythm.  Patient had a recent chest x-ray performed about 2 weeks ago which showed no acute cardiopulmonary findings, this occurred in the midst of the patient's symptoms.  Patient did have an echocardiogram performed in 2019 which showed a grade 1 diastolic dysfunction and ejection fraction 55-60%.  Patient currently without symptoms. Plan: -Discussed patient with Dr. Perley Jain and Dr. Karen Chafe -We will start with a BNP, if elevated will check a echocardiogram for signs of worsening heart failure.

## 2020-01-05 LAB — BRAIN NATRIURETIC PEPTIDE: BNP: 6.5 pg/mL (ref 0.0–100.0)

## 2020-01-06 ENCOUNTER — Encounter: Payer: Self-pay | Admitting: Family Medicine

## 2020-02-29 ENCOUNTER — Other Ambulatory Visit: Payer: Self-pay

## 2020-02-29 ENCOUNTER — Ambulatory Visit (HOSPITAL_COMMUNITY)
Admission: EM | Admit: 2020-02-29 | Discharge: 2020-02-29 | Disposition: A | Discharge status: Home / Self Care | Payer: BLUE CROSS/BLUE SHIELD | Attending: Family Medicine | Admitting: Family Medicine

## 2020-02-29 ENCOUNTER — Emergency Department (HOSPITAL_COMMUNITY)
Admission: EM | Admit: 2020-02-29 | Discharge: 2020-02-29 | Discharge status: Against Medical Advice | Payer: BLUE CROSS/BLUE SHIELD

## 2020-02-29 DIAGNOSIS — L0291 Cutaneous abscess, unspecified: Secondary | ICD-10-CM | POA: Diagnosis not present

## 2020-02-29 MED ORDER — DOXYCYCLINE HYCLATE 100 MG PO TABS: 100.0000 mg | ORAL_TABLET | Freq: Two times a day (BID) | ORAL | 0 refills | Status: DC

## 2020-02-29 NOTE — ED Triage Notes (Signed)
Pt states that she has a cyst on the lower part of her abdomen. She noticed it 2-3 days ago.

## 2020-02-29 NOTE — Discharge Instructions (Signed)
Follow up with your Primary Care in about a week if you are not feeling completely better. Apply warm compresses to the area and keep it clean and dry.

## 2020-02-29 NOTE — ED Provider Notes (Signed)
[facility]    CSN: [id number] Arrival date & time: 02/29/20  1201      History   Chief Complaint Chief Complaint  Patient presents with  . Cyst    HPI Rita Bass is a 54 y.o. female.   Patient presenting today with 2-3 days of tenderness and swelling right lower abdomen. Denies fever, drainage, injury to area, states she can't see the area so unsure if it's getting worse over time. Has not been trying anything for it thus far. Is diabetic so wanted to make sure to get the area checked out quickly.      Past Medical History:  Diagnosis Date  . Arthritis   . Asthma   . Chronic pain of left knee   . Complication of anesthesia   . GERD (gastroesophageal reflux disease)   . H/O bronchitis   . Kidney stones   . Obesity   . Renal disorder   . Shortness of breath dyspnea   . Sleep apnea in adult    Polysomnogram pending.  Followed by Dr. Craige Cotta    Patient Active Problem List   Diagnosis Date Noted  . Orthopnea 01/04/2020  . Chronic cough 12/23/2019  . Right sciatic nerve pain 10/05/2019  . Right flank pain 09/22/2019  . Dyspepsia 05/22/2019  . Essential hypertension 04/07/2019  . Vaginal bleeding 01/27/2019  . Dysuria 01/27/2019  . Low back pain 01/27/2019  . History of umbilical hernia repair 12/22/2018  . Abdominal cramping 09/15/2018  . Medication refill 12/18/2017  . Unilateral primary osteoarthritis, right knee 12/05/2017  . Body mass index 50.0-59.9, adult (HCC) 12/05/2017  . Diastolic dysfunction with heart failure (HCC) 10/25/2017  . 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 Screen for colon cancer 10/25/2017  . Insomnia 10/25/2017  . Diabetic nephropathy associated with type 2 diabetes mellitus (HCC) 10/25/2017  . Type 2 diabetes mellitus with complication (HCC) 09/13/2017  . At risk for acute ischemic cardiac event 08/16/2017  . Sleep apnea 08/16/2017  . Gastroesophageal reflux disease 08/16/2017  . Chronic pain of right knee 07/13/2016  .  Morbid obesity (HCC) 02/04/2015  . Menorrhagia with regular cycle 11/18/2014    Past Surgical History:  Procedure Laterality Date  . CESAREAN SECTION     6 c-sections  . CHOLECYSTECTOMY    . HERNIA REPAIR    . KIDNEY STONE SURGERY    . OOPHORECTOMY    . TRANSTHORACIC ECHOCARDIOGRAM  09/2017   Technically difficult study.  Did not use Definity contrast.  EF was 55 to 60% with moderate LVH and grade 1 diastolic dysfunction.  No significant valvular lesions noted.  No regional wall motion normality but difficult to assess due to poor imaging.   . TUBAL LIGATION      OB History    Gravida  6   Para  6   Term  5   Preterm  1   AB  0   Living  7     SAB  0   IAB  0   Ectopic  0   Multiple  2   Live Births               Home Medications    Prior to Admission medications   Medication Sig Start Date End Date Taking? Authorizing Provider  Accu-Chek Softclix Lancets lancets Use as instructed 04/07/19   Garnette Gunner, MD  acetaminophen (TYLENOL 8 HOUR) 650 MG CR tablet Take 1 tablet (650 mg total) by mouth every 8 (eight)  hours as needed for pain. 12/16/17   Garnette Gunnerhompson, Aaron B, MD  albuterol (PROVENTIL) (2.5 MG/3ML) 0.083% nebulizer solution Take 3 mLs (2.5 mg total) by nebulization every 6 (six) hours as needed for wheezing or shortness of breath. 12/25/19   Derrel Nipresenzo, Victor, MD  atorvastatin (LIPITOR) 40 MG tablet Take 1 tablet (40 mg total) by mouth daily. 12/22/18   Garnette Gunnerhompson, Aaron B, MD  azithromycin (ZITHROMAX) 250 MG tablet Take 2 tablets on day 1, then 1 tablet daily on days 2-5 for a total of 5 days 12/17/19   Autry-Lott, Randa EvensSimone, DO  benzonatate (TESSALON) 100 MG capsule Take 1 capsule (100 mg total) by mouth 2 (two) times daily as needed for cough. 12/25/19   Derrel Nipresenzo, Victor, MD  dicyclomine (BENTYL) 10 MG capsule TAKE 1 CAPSULE(10 MG) BY MOUTH FOUR TIMES DAILY BEFORE MEALS AND AT BEDTIME 09/18/19   Melene PlanKim, Koraima Albertsen E, MD  doxycycline (VIBRA-TABS) 100 MG tablet Take 1  tablet (100 mg total) by mouth 2 (two) times daily. 02/29/20   Particia NearingLane, Elana Jian Elizabeth, PA-C  DULoxetine (CYMBALTA) 60 MG capsule Take 2 capsules (120 mg total) by mouth daily. 03/10/18   Garnette Gunnerhompson, Aaron B, MD  empagliflozin (JARDIANCE) 25 MG TABS tablet Take 25 mg by mouth daily. 04/07/19   Garnette Gunnerhompson, Aaron B, MD  gabapentin (NEURONTIN) 100 MG capsule Take 1 capsule (100 mg total) by mouth 3 (three) times daily. 10/05/19   Jackelyn PolingWelborn, Ryan, DO  glucose blood (ACCU-CHEK AVIVA) test strip Use as instructed 11/14/17   Diallo, Lilia ArgueAbdoulaye, MD  HYDROcodone-homatropine (HYCODAN) 5-1.5 MG/5ML syrup Take 5 mLs by mouth every 6 (six) hours as needed for cough. 12/05/19   Moshe CiproMatthews, Stephanie, NP  Insulin Glargine (LANTUS) 100 UNIT/ML Solostar Pen Inject 44 Units into the skin daily. Take after largest meal. 05/29/18   Hensel, Santiago BumpersWilliam A, MD  Insulin Pen Needle (PEN NEEDLES 29GX1/2") 29G X 12MM MISC Use to inject Victoza once daily 10/11/17   Moses MannersHensel, William A, MD  liraglutide (VICTOZA) 18 MG/3ML SOPN Inject 0.3 mLs (1.8 mg total) into the skin daily. 12/02/17   Moses MannersHensel, William A, MD  metFORMIN (GLUCOPHAGE) 500 MG tablet Take 2 tablets (1,000 mg total) by mouth 2 (two) times daily with a meal. 04/07/19   Garnette Gunnerhompson, Aaron B, MD  mometasone-formoterol (DULERA) 100-5 MCG/ACT AERO Inhale 2 puffs into the lungs daily. 12/23/19   Mardella LaymanHagler, Brian, MD  montelukast (SINGULAIR) 10 MG tablet Take 1 tablet (10 mg total) by mouth at bedtime. 12/25/19   Derrel Nipresenzo, Victor, MD  nystatin (MYCOSTATIN/NYSTOP) powder Apply topically 4 (four) times daily as needed. Patient not taking: Reported on 10/05/2019 03/24/19   Garnette Gunnerhompson, Aaron B, MD  omeprazole (PRILOSEC) 40 MG capsule Take 1 capsule (40 mg total) by mouth daily. 12/23/19   Mardella LaymanHagler, Brian, MD  ROZEREM 8 MG tablet TAKE 1 TABLET(8 MG) BY MOUTH AT BEDTIME 11/25/17   Garnette Gunnerhompson, Aaron B, MD  simethicone (GAS-X) 80 MG chewable tablet Chew 1 tablet (80 mg total) by mouth every 6 (six) hours as needed for  flatulence. 09/15/18   Garnette Gunnerhompson, Aaron B, MD  tiotropium (SPIRIVA HANDIHALER) 18 MCG inhalation capsule Place 1 capsule (18 mcg total) into inhaler and inhale daily. Inhale two times, once a day Patient not taking: Reported on 10/05/2019 03/24/19   Garnette Gunnerhompson, Aaron B, MD  traMADol (ULTRAM) 50 MG tablet Take 2 tablets (100 mg total) by mouth every 6 (six) hours as needed for moderate pain. 10/05/19   Jackelyn PolingWelborn, Ryan, DO  lisinopril (PRINIVIL,ZESTRIL) 10 MG tablet Take  1 tablet (10 mg total) by mouth daily. 10/25/17 12/23/19  Garnette Gunner, MD    Family History Family History  Problem Relation Age of Onset  . Breast cancer Mother   . Other Father        committed suicide  . Heart attack Maternal Aunt     Social History Social History   Tobacco Use  . Smoking status: Never Smoker  . Smokeless tobacco: Never Used  Substance Use Topics  . Alcohol use: No  . Drug use: No     Allergies   Patient has no known allergies.   Review of Systems Review of Systems PER HPI   Physical Exam Triage Vital Signs ED Triage Vitals [02/29/20 1346]  Enc Vitals Group     BP 132/65     Pulse Rate 79     Resp      Temp 97.9 F (36.6 C)     Temp Source Oral     SpO2 99 %     Weight      Height      Head Circumference      Peak Flow      Pain Score 4     Pain Loc      Pain Edu?      Excl. in GC?    No data found.  Updated Vital Signs BP 132/65 (BP Location: Right Arm)   Pulse 79   Temp 97.9 F (36.6 C) (Oral)   SpO2 99%   Visual Acuity Right Eye Distance:   Left Eye Distance:   Bilateral Distance:    Right Eye Near:   Left Eye Near:    Bilateral Near:     Physical Exam Vitals and nursing note reviewed.  Constitutional:      Appearance: Normal appearance. She is not ill-appearing.  HENT:     Head: Atraumatic.  Eyes:     Extraocular Movements: Extraocular movements intact.     Conjunctiva/sclera: Conjunctivae normal.  Cardiovascular:     Rate and Rhythm: Normal rate and  regular rhythm.     Heart sounds: Normal heart sounds.  Pulmonary:     Effort: Pulmonary effort is normal.     Breath sounds: Normal breath sounds.  Abdominal:     General: Bowel sounds are normal. There is no distension.     Palpations: Abdomen is soft.     Tenderness: There is no abdominal tenderness. There is no guarding.  Musculoskeletal:        General: Normal range of motion.     Cervical back: Normal range of motion and neck supple.  Skin:    General: Skin is warm and dry.     Comments: 2 cm area of firmness and erythema left lower abdomen, no fluctuance, active drainage  Neurological:     Mental Status: She is alert and oriented to person, place, and time.  Psychiatric:        Mood and Affect: Mood normal.        Thought Content: Thought content normal.        Judgment: Judgment normal.      UC Treatments / Results  Labs (all labs ordered are listed, but only abnormal results are displayed) Labs Reviewed - No data to display  EKG   Radiology No results found.  Procedures Procedures (including critical care time)  Medications Ordered in UC Medications - No data to display  Initial Impression / Assessment and Plan / UC Course  I have  reviewed the triage vital signs and the nursing notes.  Pertinent labs & imaging results that were available during my care of the patient were reviewed by me and considered in my medical decision making (see chart for details).     Abscess without fluctuance at this time, do not feel I and D would be effective at this stage so will start doxycycline, warm compresses, and perform home wound care. F/u in 1 week with PCP for recheck sxs. Return sooner if worsening or not resolving in meantime.   Final Clinical Impressions(s) / UC Diagnoses   Final diagnoses:  Abscess     Discharge Instructions     Follow up with your Primary Care in about a week if you are not feeling completely better. Apply warm compresses to the area and  keep it clean and dry.     ED Prescriptions    Medication Sig Dispense Auth. Provider   doxycycline (VIBRA-TABS) 100 MG tablet Take 1 tablet (100 mg total) by mouth 2 (two) times daily. 20 tablet Particia Nearing, [place]     PDMP not reviewed this encounter.   Particia Nearing, [place] 02/29/20 1409

## 2020-03-02 DIAGNOSIS — Z23 Encounter for immunization: Secondary | ICD-10-CM | POA: Diagnosis not present

## 2020-03-07 ENCOUNTER — Ambulatory Visit (INDEPENDENT_AMBULATORY_CARE_PROVIDER_SITE_OTHER): Payer: Medicaid Other | Admitting: Family Medicine

## 2020-03-07 ENCOUNTER — Other Ambulatory Visit: Payer: Self-pay

## 2020-03-07 ENCOUNTER — Encounter: Payer: Self-pay | Admitting: Family Medicine

## 2020-03-07 VITALS — BP 120/70 | Ht 64.0 in | Wt 296.2 lb

## 2020-03-07 DIAGNOSIS — G8929 Other chronic pain: Secondary | ICD-10-CM

## 2020-03-07 DIAGNOSIS — M1711 Unilateral primary osteoarthritis, right knee: Secondary | ICD-10-CM

## 2020-03-07 DIAGNOSIS — M25561 Pain in right knee: Secondary | ICD-10-CM

## 2020-03-07 DIAGNOSIS — Z79891 Long term (current) use of opiate analgesic: Secondary | ICD-10-CM

## 2020-03-07 DIAGNOSIS — E118 Type 2 diabetes mellitus with unspecified complications: Secondary | ICD-10-CM

## 2020-03-07 MED ORDER — TRAMADOL HCL 50 MG PO TABS
100.0000 mg | ORAL_TABLET | Freq: Four times a day (QID) | ORAL | 0 refills | Status: DC | PRN
Start: 2020-03-07 — End: 2020-07-07

## 2020-03-07 NOTE — Progress Notes (Signed)
SUBJECTIVE:   CHIEF COMPLAINT / HPI: right knee pain  R knee pain: patient has long h/o R knee OA. She reports that she has dull aching in her r knee that is worse with use and at night. Pain some times includes muscle cramps around the knee. X-rays From August of 2019 identified tricompartmental osseous spurring, worst in patellofemoral compartment. Patient has had physical therapy in the past, but not in the last 1+ years. She saw orthopedics for this problem in 2019 and they did knee injections, which she reports did not help, and resulted in discontinuation of therapy. She was recommended to have a knee replacement per patient's report to previous PCP, Dr. Janee Morn. At that point in time, patient was approx 400 lbs and was not a surgical candidate due to morbid obesity. Since that time, patient has changed her diet and has lost over 100 lbs, presently 296 lbs. She reports that she was told she needed to be approx 200 lbs for surgery. She has tried tylenol without relief of pain. She has used tramadol in the past, which she says has helped the most with relieving pain, especially at night. She has also tried gabapentin, which did not help.  BMI 50: Patient has made many praise-worthy and encouraging changes in her diet and has already lost 100 lbs on her own. We discussed next steps to get to goal and to be able to have surgery. She currently has difficulty exercising due to knee pain. She has not worked with a nutritionist in the past.  DM: patient needs a new glucometer as hers has stopped working.   PERTINENT  PMH / PSH: R knee OA, morbid obesity, DMT2  OBJECTIVE:   BP 120/70   Ht 5\' 4"  (1.626 m)   Wt 296 lb 4 oz (134.4 kg)   BMI 50.85 kg/m   Nursing note and vitals reviewed GEN: older-appearing WW, resting comfortably in chair, NAD, with morbid obesity HEENT: NCAT. Sclera without injection or icterus. MMM.   Cardiac: Regular rate and rhythm. Normal S1/S2. No murmurs, rubs, or  gallops appreciated. 2+ radial pulses. Lungs: Clear bilaterally to ascultation. No increased WOB, no accessory muscle usage. No w/r/r. Neuro: AOx3, at baseline, patellar reflexes intact b/l Ext: Knees: Inspection was negative for erythema, ecchymosis, and effusion. Obvious bony abnormalities and signs of osteophyte development on right patella. Palpation yielded no asymmetric warmth; No joint line tenderness on left, mild joint line tenderness on medial side of right patella; No condyle tenderness; No patellar tenderness; patellar crepitus present on Right, not left. No obvious Baker's cyst development. ROM normal bilaterally in flexion and extension. Psych: Pleasant and appropriate   ASSESSMENT/PLAN:   Morbid obesity (HCC) Praised patient and encouraged further efforts. Patient is not sure what she would like to do next to further weight loss goals, we discussed referral to nutrition as an option as well as medical weight management ([facility]) and bariatric surgery. Patient to consider further options and let know if she would like any of these referrals.  Chronic pain of right knee Patient has had most success treating pain with tramadol, reasonable to use same regimen again given imaging and exam findings. Patient could have OA, but also likely element of patellofemoral syndrome due to pain around quadriceps and osteophyte formation at patella. Will place referral to PT, as quadricep strengthening is indicated in both diagnoses. Patient reports that last PT referral, she never received phone call. Gave patient phone number of  [facility] op orthopedic rehab on [address] to call as well. Also recommended [employer] for aquatic therapy for exercise as an option as well. Recommend f/u in 1-3 months or as needed. Also recommended OTC options for adjunct pain management with voltaren gen, lidocaine patches, or salonpas.  Type 2 diabetes mellitus with complication (HCC) Re-ordered glucometer  and supplies.     Shirlean Mylar, MD [facility]

## 2020-03-07 NOTE — Patient Instructions (Addendum)
It was a pleasure to see you today!  1. For physical therapy, I have placed a referral and you should receive a phone call in 1-3 weeks. If you do not receive a phone call in that time period, please let me know at [phone]. The phone number to call the physical therapy office ([facility] [address]) is [phone].  2. I have sent in a prescription to your pharmacy for a new glucometer.  3. For your pain, you may use tramadol. I also recommend using voltaren gel over the counter or salonpas or lidocaine patches (all OTC). All of these are band-aids to manage pain, but to get your knee better you will eventually need a knee replacement. I recommend aquatic exercises (consider the [employer]) and you qualify for nutritionist referral and you can consider referral to [facility], a medical weight management program, if you are interested. Please let us know how we can help, and keep up the good work, you should be really proud of yourself!  Be Well and Happy Holidays,  Dr. Leary Roca

## 2020-03-07 NOTE — Assessment & Plan Note (Signed)
Praised patient and encouraged further efforts. Patient is not sure what she would like to do next to further weight loss goals, we discussed referral to nutrition as an option as well as medical weight management ([facility]) and bariatric surgery. Patient to consider further options and let us know if she would like any of these referrals.

## 2020-03-07 NOTE — Assessment & Plan Note (Signed)
Re-ordered glucometer and supplies.

## 2020-03-07 NOTE — Assessment & Plan Note (Signed)
Patient has had most success treating pain with tramadol, reasonable to use same regimen again given imaging and exam findings. Patient could have OA, but also likely element of patellofemoral syndrome due to pain around quadriceps and osteophyte formation at patella. Will place referral to PT, as quadricep strengthening is indicated in both diagnoses. Patient reports that last PT referral, she never received phone call. Gave patient phone number of [facility] op orthopedic rehab on [address] to call as well. Also recommended [employer] for aquatic therapy for exercise as an option as well. Recommend f/u in 1-3 months or as needed. Also recommended OTC options for adjunct pain management with voltaren gen, lidocaine patches, or salonpas.

## 2020-03-21 ENCOUNTER — Ambulatory Visit: Payer: Medicaid Other | Admitting: Family Medicine

## 2020-03-21 NOTE — Progress Notes (Deleted)
    SUBJECTIVE:   CHIEF COMPLAINT / HPI: emesis and nausea   Patient reports that she has been experiencing nausea and emesis with every meal for ***.  She reports ***bloating,weight loss  PERTINENT  PMH / PSH:  DM Obesity   OBJECTIVE:   There were no vitals taken for this visit.  General: female appearing stated age in no acute distress HEENT: MMM, no oral lesions noted,Neck non-tender without lymphadenopathy, masses or thyromegaly*** Cardio: Normal S1 and S2, no S3 or S4. Rhythm is regular***. No murmurs or rubs.  Bilateral radial pulses palpable Pulm: Clear to auscultation bilaterally, no crackles, wheezing, or diminished breath sounds. Normal respiratory effort, stable on *** Abdomen: Bowel sounds normal. Abdomen soft and non-tender. *** Extremities: No peripheral edema. Warm/ well perfused. *** Neuro: pt alert and oriented x4    ASSESSMENT/PLAN:   No problem-specific Assessment & Plan notes found for this encounter.     Ronnald Ramp, MD [facility]

## 2020-03-30 ENCOUNTER — Ambulatory Visit: Payer: Medicaid Other | Admitting: Physical Therapy

## 2020-04-20 ENCOUNTER — Ambulatory Visit: Payer: Medicaid Other | Attending: Family Medicine | Admitting: Physical Therapy

## 2020-05-21 ENCOUNTER — Ambulatory Visit (HOSPITAL_COMMUNITY)
Admission: EM | Admit: 2020-05-21 | Discharge: 2020-05-21 | Disposition: A | Discharge status: Home / Self Care | Payer: Medicaid Other | Attending: Family Medicine | Admitting: Family Medicine

## 2020-05-21 ENCOUNTER — Encounter (HOSPITAL_COMMUNITY): Payer: Self-pay

## 2020-05-21 ENCOUNTER — Other Ambulatory Visit: Payer: Self-pay

## 2020-05-21 DIAGNOSIS — S46212A Strain of muscle, fascia and tendon of other parts of biceps, left arm, initial encounter: Secondary | ICD-10-CM

## 2020-05-21 DIAGNOSIS — M5432 Sciatica, left side: Secondary | ICD-10-CM | POA: Diagnosis not present

## 2020-05-21 MED ORDER — DEXAMETHASONE SODIUM PHOSPHATE 10 MG/ML IJ SOLN
10.0000 mg | Freq: Once | INTRAMUSCULAR | Status: AC
  Administered 2020-05-21: 10 mg via INTRAMUSCULAR

## 2020-05-21 MED ORDER — DEXAMETHASONE SODIUM PHOSPHATE 10 MG/ML IJ SOLN
INTRAMUSCULAR | Status: AC
  Filled 2020-05-21: qty 1

## 2020-05-21 MED ORDER — CYCLOBENZAPRINE HCL 5 MG PO TABS: 5.0000 mg | ORAL_TABLET | Freq: Two times a day (BID) | ORAL | 0 refills | Status: DC | PRN

## 2020-05-21 NOTE — ED Provider Notes (Signed)
[facility]    CSN: [id number] Arrival date & time: 05/21/20  1302      History   Chief Complaint Chief Complaint  Patient presents with  . Leg Pain  . Neck Pain  . Arm Pain    HPI Rita Bass is a 55 y.o. female.   HPI Patient presents today with left lower sciatica and left bicep pain ongoing for 4 days.  Patient has not had a known injury.  Patient was concerned that the left bicep pain was related to a small cystic-like lesion she has lower scalp cervical region on the left side.  The cystic lesion is non tender and soft. Uncertain of when the cyst initially developed and area us nonpainful and non-draining. She also left buttocks and left hip pain. No injury. Feels similar to prior sciatica. She is taking ibuprofen 800 mg within some improvement of symptoms. Past Medical History:  Diagnosis Date  . Arthritis   . Asthma   . Chronic pain of left knee   . Complication of anesthesia   . GERD (gastroesophageal reflux disease)   . H/O bronchitis   . Kidney stones   . Obesity   . Renal disorder   . Shortness of breath dyspnea   . Sleep apnea in adult    Polysomnogram pending.  Followed by Dr. Craige CottaSood    Patient Active Problem List   Diagnosis Date Noted  . Orthopnea 01/04/2020  . Chronic cough 12/23/2019  . Right sciatic nerve pain 10/05/2019  . Right flank pain 09/22/2019  . Dyspepsia 05/22/2019  . Essential hypertension 04/07/2019  . Vaginal bleeding 01/27/2019  . Dysuria 01/27/2019  . Low back pain 01/27/2019  . History of umbilical hernia repair 12/22/2018  . Abdominal cramping 09/15/2018  . Medication refill 12/18/2017  . Unilateral primary osteoarthritis, right knee 12/05/2017  . Body mass index 50.0-59.9, adult (HCC) 12/05/2017  . Diastolic dysfunction with heart failure (HCC) 10/25/2017  . 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 Screen for colon cancer 10/25/2017  . Insomnia 10/25/2017  . Diabetic nephropathy associated with type 2 diabetes  mellitus (HCC) 10/25/2017  . Type 2 diabetes mellitus with complication (HCC) 09/13/2017  . At risk for acute ischemic cardiac event 08/16/2017  . Sleep apnea 08/16/2017  . Gastroesophageal reflux disease 08/16/2017  . Chronic pain of right knee 07/13/2016  . Morbid obesity (HCC) 02/04/2015  . Menorrhagia with regular cycle 11/18/2014    Past Surgical History:  Procedure Laterality Date  . CESAREAN SECTION     6 c-sections  . CHOLECYSTECTOMY    . HERNIA REPAIR    . KIDNEY STONE SURGERY    . OOPHORECTOMY    . TRANSTHORACIC ECHOCARDIOGRAM  09/2017   Technically difficult study.  Did not use Definity contrast.  EF was 55 to 60% with moderate LVH and grade 1 diastolic dysfunction.  No significant valvular lesions noted.  No regional wall motion normality but difficult to assess due to poor imaging.   . TUBAL LIGATION      OB History    Gravida  6   Para  6   Term  5   Preterm  1   AB  0   Living  7     SAB  0   IAB  0   Ectopic  0   Multiple  2   Live Births               Home Medications    Prior to Admission medications  Medication Sig Start Date End Date Taking? Authorizing Provider  Accu-Chek Softclix Lancets lancets Use as instructed 04/07/19   Garnette Gunner, MD  acetaminophen (TYLENOL 8 HOUR) 650 MG CR tablet Take 1 tablet (650 mg total) by mouth every 8 (eight) hours as needed for pain. 12/16/17   Garnette Gunner, MD  albuterol (PROVENTIL) (2.5 MG/3ML) 0.083% nebulizer solution Take 3 mLs (2.5 mg total) by nebulization every 6 (six) hours as needed for wheezing or shortness of breath. 12/25/19   Derrel Nip, MD  atorvastatin (LIPITOR) 40 MG tablet Take 1 tablet (40 mg total) by mouth daily. 12/22/18   Garnette Gunner, MD  azithromycin (ZITHROMAX) 250 MG tablet Take 2 tablets on day 1, then 1 tablet daily on days 2-5 for a total of 5 days 12/17/19   Autry-Lott, Randa Evens, DO  benzonatate (TESSALON) 100 MG capsule Take 1 capsule (100 mg total) by  mouth 2 (two) times daily as needed for cough. 12/25/19   Derrel Nip, MD  dicyclomine (BENTYL) 10 MG capsule TAKE 1 CAPSULE(10 MG) BY MOUTH FOUR TIMES DAILY BEFORE MEALS AND AT BEDTIME 09/18/19   Melene Plan, MD  doxycycline (VIBRA-TABS) 100 MG tablet Take 1 tablet (100 mg total) by mouth 2 (two) times daily. 02/29/20   Particia Nearing, PA-C  DULoxetine (CYMBALTA) 60 MG capsule Take 2 capsules (120 mg total) by mouth daily. 03/10/18   Garnette Gunner, MD  empagliflozin (JARDIANCE) 25 MG TABS tablet Take 25 mg by mouth daily. 04/07/19   Garnette Gunner, MD  gabapentin (NEURONTIN) 100 MG capsule Take 1 capsule (100 mg total) by mouth 3 (three) times daily. 10/05/19   Jackelyn Poling, DO  glucose blood (ACCU-CHEK AVIVA) test strip Use as instructed 11/14/17   Diallo, Lilia Argue, MD  Insulin Glargine (LANTUS) 100 UNIT/ML Solostar Pen Inject 44 Units into the skin daily. Take after largest meal. 05/29/18   Hensel, Santiago Bumpers, MD  Insulin Pen Needle (PEN NEEDLES 29GX1/2") 29G X MISC Use to inject Victoza once daily 10/11/17   Moses Manners, MD  liraglutide (VICTOZA) 18 MG/3ML SOPN Inject 0.3 mLs (1.8 mg total) into the skin daily. 12/02/17   Moses Manners, MD  metFORMIN (GLUCOPHAGE) 500 MG tablet Take 2 tablets (1,000 mg total) by mouth 2 (two) times daily with a meal. 04/07/19   Garnette Gunner, MD  mometasone-formoterol (DULERA) 100-5 MCG/ACT AERO Inhale 2 puffs into the lungs daily. 12/23/19   Mardella Layman, MD  montelukast (SINGULAIR) 10 MG tablet Take 1 tablet (10 mg total) by mouth at bedtime. 12/25/19   Derrel Nip, MD  nystatin (MYCOSTATIN/NYSTOP) powder Apply topically 4 (four) times daily as needed. Patient not taking: Reported on 10/05/2019 03/24/19   Garnette Gunner, MD  omeprazole (PRILOSEC) 40 MG capsule Take 1 capsule (40 mg total) by mouth daily. 12/23/19   Mardella Layman, MD  ROZEREM 8 MG tablet TAKE 1 TABLET(8 MG) BY MOUTH AT BEDTIME 11/25/17   Garnette Gunner, MD   simethicone (GAS-X) 80 MG chewable tablet Chew 1 tablet (80 mg total) by mouth every 6 (six) hours as needed for flatulence. 09/15/18   Garnette Gunner, MD  tiotropium (SPIRIVA HANDIHALER) 18 MCG inhalation capsule Place 1 capsule (18 mcg total) into inhaler and inhale daily. Inhale two times, once a day Patient not taking: Reported on 10/05/2019 03/24/19   Garnette Gunner, MD  traMADol (ULTRAM) 50 MG tablet Take 2 tablets (100 mg total) by mouth every 6 (six)  hours as needed for moderate pain. 03/07/20   Shirlean Mylar, MD  lisinopril (PRINIVIL,ZESTRIL) 10 MG tablet Take 1 tablet (10 mg total) by mouth daily. 10/25/17 12/23/19  Garnette Gunner, MD    Family History Family History  Problem Relation Age of Onset  . Breast cancer Mother   . Other Father        committed suicide  . Heart attack Maternal Aunt     Social History Social History   Tobacco Use  . Smoking status: Never Smoker  . Smokeless tobacco: Never Used  Substance Use Topics  . Alcohol use: No  . Drug use: No     Allergies   Patient has no known allergies.   Review of Systems Review of Systems Pertinent negatives listed in HPI   Physical Exam Triage Vital Signs ED Triage Vitals [05/21/20 1427]  Enc Vitals Group     BP      Pulse      Resp      Temp      Temp src      SpO2      Weight      Height      Head Circumference      Peak Flow      Pain Score 8     Pain Loc      Pain Edu?      Excl. in GC?    No data found.  Updated Vital Signs BP 128/86 (BP Location: Right Arm)   Pulse 73   Temp 97.9 F (36.6 C) (Oral)   Resp 17   SpO2 97%   Visual Acuity Right Eye Distance:   Left Eye Distance:   Bilateral Distance:    Right Eye Near:   Left Eye Near:    Bilateral Near:     Physical Exam Constitutional:      Appearance: She is obese. She is not toxic-appearing or diaphoretic.  Neck:      Comments: 35mm cystic lesion palpable non-painful, mobile  Cardiovascular:     Rate and  Rhythm: Normal rate and regular rhythm.     Pulses: Normal pulses.  Pulmonary:     Effort: Pulmonary effort is normal.     Breath sounds: Normal breath sounds and air entry.  Musculoskeletal:       Arms:     Cervical back: Normal range of motion.       Legs:  Lymphadenopathy:     Cervical: No cervical adenopathy.  Psychiatric:        Attention and Perception: Attention normal.        Speech: Speech normal.        Behavior: Behavior normal.      UC Treatments / Results  Labs (all labs ordered are listed, but only abnormal results are displayed) Labs Reviewed - No data to display  EKG   Radiology No results found.  Procedures Procedures (including critical care time)  Medications Ordered in UC Medications - No data to display  Initial Impression / Assessment and Plan / UC Course  I have reviewed the triage vital signs and the nursing notes.  Pertinent labs & imaging results that were available during my care of the patient were reviewed by me and considered in my medical decision making (see chart for details).    Acute left sided sciatica and strain of left bicep. Continue ibuprofen as previously prescribed. For acute pain, cyclobenzaprine 5-10 mg up to twice daily as needed. Precautions given as  medication causes drowsiness. Advised to follow-up with PCP if symptoms worsen or do not improve. Final Clinical Impressions(s) / UC Diagnoses   Final diagnoses:  Left sided sciatica  Strain of left biceps muscle, initial encounter     Discharge Instructions     Continue ibuprofen for pain.   May take 800 mg of ibuprofen 3 times daily Muscle relaxer, cyclobenzaprine may take 1 to 2 tablets twice daily as needed for acute pain however avoid driving as medication can cause severe drowsiness.    ED Prescriptions    None     PDMP not reviewed this encounter.   Bing Neighbors, FNP 05/22/20 1731

## 2020-05-21 NOTE — Discharge Instructions (Addendum)
Continue ibuprofen for pain.   May take 800 mg of ibuprofen 3 times daily Muscle relaxer, cyclobenzaprine may take 1 to 2 tablets twice daily as needed for acute pain however avoid driving as medication can cause severe drowsiness.

## 2020-05-21 NOTE — ED Triage Notes (Signed)
Pt presents with lump in left side of neck that is not painful but pt complains of pain radiating down left arm; pt also complains of pain from left hip radiating down left leg for past few days.

## 2020-06-07 ENCOUNTER — Other Ambulatory Visit: Payer: Self-pay

## 2020-06-07 ENCOUNTER — Ambulatory Visit: Payer: Medicaid Other | Admitting: Family Medicine

## 2020-06-07 VITALS — BP 130/104 | HR 81 | Ht 64.0 in | Wt 291.5 lb

## 2020-06-07 DIAGNOSIS — Z6841 Body Mass Index (BMI) 40.0 and over, adult: Secondary | ICD-10-CM | POA: Diagnosis not present

## 2020-06-07 DIAGNOSIS — B350 Tinea barbae and tinea capitis: Secondary | ICD-10-CM | POA: Insufficient documentation

## 2020-06-07 DIAGNOSIS — W108XXA Fall (on) (from) other stairs and steps, initial encounter: Secondary | ICD-10-CM | POA: Insufficient documentation

## 2020-06-07 MED ORDER — TERBINAFINE HCL 250 MG PO TABS
250.0000 mg | ORAL_TABLET | Freq: Every day | ORAL | 1 refills | Status: DC
Start: 2020-06-07 — End: 2020-09-16

## 2020-06-07 MED ORDER — KETOCONAZOLE 2 % EX SHAM: 1.0000 "application " | MEDICATED_SHAMPOO | CUTANEOUS | 0 refills | Status: DC

## 2020-06-07 MED ORDER — CYCLOBENZAPRINE HCL 5 MG PO TABS: 5.0000 mg | ORAL_TABLET | Freq: Two times a day (BID) | ORAL | 0 refills | Status: DC | PRN

## 2020-06-07 NOTE — Assessment & Plan Note (Signed)
Patient fell 2 days ago.  Is able to ambulate however with difficulty secondary to pain.  Reports continued pain of her right hip.  No bruising/masses/deformity appreciated.  Patient denies urinary or bowel incontinence. -X-ray of right hip ordered -Patient given prescription for Flexeril to decrease muscle ache/spasm -Patient to reach out to physical therapy to start sessions to strengthen legs

## 2020-06-07 NOTE — Progress Notes (Signed)
SUBJECTIVE:   CHIEF COMPLAINT / HPI:   Fall: 2 days ago Sunday 3/20 fell forward onto right side off of porch. She was stepping down 1 step (8") but when she stepped she lost her balance/left knee gave out on her and she fell onto the ground/into her trash can.  She was able to get up on her own after a couple minutes. She felt bad pain in her right thigh sharp burning pain in butt down back of R leg greater than L leg. She took left over muscle relaxer and tylenol which helped, also took warm shower. The pain reduced but is still present. Pain not much better, stays 6/10.  She denies urinary/bowel incontinence.  Bump on scalp: Patient reports that a few days ago she found a tender lump in the back of her left neck.  Shortly after she found a larger bump on the back of her left scalp.  Reports it is mildly itchy.  Denies fevers, headaches, vision changes, chills, body aches.  PERTINENT  PMH / PSH:  Patient Active Problem List   Diagnosis Date Noted  . Fall (on) (from) other stairs and steps, initial encounter 06/07/2020  . Tinea capitis 06/07/2020  . Orthopnea 01/04/2020  . Chronic cough 12/23/2019  . Right sciatic nerve pain 10/05/2019  . Right flank pain 09/22/2019  . Dyspepsia 05/22/2019  . Essential hypertension 04/07/2019  . Vaginal bleeding 01/27/2019  . Dysuria 01/27/2019  . Low back pain 01/27/2019  . History of umbilical hernia repair 12/22/2018  . Abdominal cramping 09/15/2018  . Medication refill 12/18/2017  . Unilateral primary osteoarthritis, right knee 12/05/2017  . BMI 50.0-59.9, adult (HCC) 12/05/2017  . Diastolic dysfunction with heart failure (HCC) 10/25/2017  . 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 Screen for colon cancer 10/25/2017  . Insomnia 10/25/2017  . Diabetic nephropathy associated with type 2 diabetes mellitus (HCC) 10/25/2017  . Type 2 diabetes mellitus with complication (HCC) 09/13/2017  . At risk for acute ischemic cardiac event 08/16/2017  . Sleep apnea  08/16/2017  . Gastroesophageal reflux disease 08/16/2017  . Chronic pain of right knee 07/13/2016  . Morbid obesity (HCC) 02/04/2015  . Menorrhagia with regular cycle 11/18/2014    OBJECTIVE:   BP (!) 130/104   Pulse 81   Ht 5\' 4"  (1.626 m)   Wt 291 lb 8 oz (132.2 kg)   SpO2 98%   BMI 50.04 kg/m    Physical exam: General: Well-appearing, no apparent distress HEENT: 8 x 8 mm tender lymph node without surrounding erythema appreciated to patient's left posterior cervical chain.  Directly above that is a Kerion measuring 3 x 3 cm patient's scalp with broken/thinning hairs present and flakiness; no fluctuance appreciated or surrounding cellulitis/erythema; no drainage, discharge Respiratory: Comfortable work of breathing on room air MSK: No bony midline tenderness appreciated to patient's spine; mild tenderness appreciated to patient's right ischial tuberosity and SI joint; no involvement of iliac crest or greater trochanter  ASSESSMENT/PLAN:   Tinea capitis Patient with lump on back of the head on the scalp concerning for tinea capitis.  Kerion appreciated on physical exam.  Patient CMP from summer 2021 reviewed, within normal limits.  Lump on neck is reactive lymph node. -Patient given ketoconazole 2% shampoo to use daily for 1 week, then twice weekly after that -Patient also prescribed terbinafine to 50 mg daily -Asked to follow-up with PCP in 2-4 weeks to either discontinue or continue patient's regimen for tinea capitis  BMI 50.0-59.9, adult [facility]) Patient  with BMI 50.  Contributed to patient's fall.  Patient interested doing physical therapy to improve leg strength and talking to Dr. Gerilyn Pilgrim to further lose weight (patient has already lost 90 pounds). -Patient to contact physical therapy to schedule sessions -Patient given card for Dr. Gerilyn Pilgrim and instructed to call her to establish care -Patient's PCP may consider referral for bariatric surgery evaluation/consultation  Fall (on)  (from) other stairs and steps, initial encounter Patient fell 2 days ago.  Is able to ambulate however with difficulty secondary to pain.  Reports continued pain of her right hip.  No bruising/masses/deformity appreciated.  Patient denies urinary or bowel incontinence. -X-ray of right hip ordered -Patient given prescription for Flexeril to decrease muscle ache/spasm -Patient to reach out to physical therapy to start sessions to strengthen legs     Dollene Cleveland, DO [facility]

## 2020-06-07 NOTE — Patient Instructions (Addendum)
Thank you for coming in to see [name] today! Please see below to review our plan for today's visit:  1. Call Dr. Gerilyn Pilgrim at your earliest convenience. Card provided. 2. Get your Xray at [facility].  3. Look out for calls from Physical Therapy.  4. Take 1-2 Flexeril at night to help reduce pain. You can take 1 tablet during the day as needed care done for muscle tightness. You can also take Tylenol 1000mg  three times daily.  For your Tinea Capitis/Fungal infection of scalp: 1.  Shampoo with ketoconazole once daily for 1 week and then twice weekly beyond that. 2.  Start taking terbinafine 250 mg tablet once daily for the next month.  Please follow-up with Dr. within the next 2-4 weeks to follow-up with your scalp and symptoms.  Please call the clinic at [phone] if your symptoms worsen or you have any concerns. It was our pleasure to serve you!   Dr. [phone] [facility]

## 2020-06-07 NOTE — Assessment & Plan Note (Signed)
Patient with BMI 50.  Contributed to patient's fall.  Patient interested doing physical therapy to improve leg strength and talking to Dr. Gerilyn Pilgrim to further lose weight (patient has already lost 90 pounds). -Patient to contact physical therapy to schedule sessions -Patient given card for Dr. Gerilyn Pilgrim and instructed to call her to establish care -Patient's PCP may consider referral for bariatric surgery evaluation/consultation

## 2020-06-07 NOTE — Assessment & Plan Note (Signed)
Patient with lump on back of the head on the scalp concerning for tinea capitis.  Kerion appreciated on physical exam.  Patient CMP from summer 2021 reviewed, within normal limits.  Lump on neck is reactive lymph node. -Patient given ketoconazole 2% shampoo to use daily for 1 week, then twice weekly after that -Patient also prescribed terbinafine to 50 mg daily -Asked to follow-up with PCP in 2-4 weeks to either discontinue or continue patient's regimen for tinea capitis

## 2020-06-08 ENCOUNTER — Ambulatory Visit
Admission: RE | Admit: 2020-06-08 | Discharge: 2020-06-08 | Disposition: A | Discharge status: Home / Self Care | Payer: Medicaid Other | Source: Ambulatory Visit | Attending: Family Medicine | Admitting: Family Medicine

## 2020-06-08 DIAGNOSIS — M25551 Pain in right hip: Secondary | ICD-10-CM | POA: Diagnosis not present

## 2020-06-08 DIAGNOSIS — W108XXA Fall (on) (from) other stairs and steps, initial encounter: Secondary | ICD-10-CM

## 2020-06-13 ENCOUNTER — Encounter: Payer: Self-pay | Admitting: Family Medicine

## 2020-06-16 NOTE — Patient Instructions (Signed)
It was great to see you! Thank you for allowing me to participate in your care!  I recommend that you always bring your medications to each appointment as this makes it easy to ensure we are on the correct medications and helps [name] not miss when refills are needed.  Our plans for today:  -We checked your A1c today it was 7.0.  We are making no medication changes at this time. -I recommend that you get a colonoscopy for screening for colon cancer.  I have ordered this and stated that it needs to be done in the hospital. -We are going to check some blood work today -We will also check a urine for any signs of chronic kidney damage from your history of diabetes. -For your hip pain I have ordered a CT scan of your hip.  We will provide you with a schedule of when to have this done before you leave. -I would like for you to make a follow-up appointment with me at my next available to discuss other concerns.  We are checking some labs today, I will call you if they are abnormal will send you a MyChart message or a letter if they are normal.  If you do not hear about your labs in the next 2 weeks please let us know.  Take care and seek immediate care sooner if you develop any concerns.   Dr. Jackelyn Poling, DO [facility]

## 2020-06-16 NOTE — Progress Notes (Signed)
SUBJECTIVE:   CHIEF COMPLAINT / HPI:   Hip pain: Patient seen recently after a fall off her porch and injured her hip. She initially took some flexeril and it helped some but she continues to have pain. The fall was two weeks.  She is able to walk but still has pain in the right hip. Her pain does shoot down the back of her right leg.    Diabetic Follow Up: Patient is a 55 y.o. female who present today for diabetic follow up.   Patient endorses no problems  Home medications include: Metformin 1000 mg twice daily, Jardiance 25 mg daily, Lantus 44 units daily Patient endorses taking these medications as prescribed.  Most recent A1Cs:  Lab Results  Component Value Date   HGBA1C 7.0 06/21/2020   HGBA1C 7.5 (A) 01/04/2020   HGBA1C 7.2 (A) 09/18/2019   Last Microalbumin, LDL, Creatinine: Lab Results  Component Value Date   MICROALBUR 150 10/25/2017   LDLCALC 102 (H) 05/29/2018   CREATININE 0.51 (L) 10/05/2019   Patient does check blood glucose on a regular basis.  Patient is not up to date on diabetic eye. Patient is not up to date on diabetic foot exam.  Hyperlipidemia: Most recent LDL of 102 on 05/29/2018.  Medications include Lipitor 40 mg daily.  Patient dorsals compliance with this medication.  We will recheck lipid panel today.   PERTINENT  PMH / PSH: History of type 2 diabetes  OBJECTIVE:   BP 108/80   Pulse 91   Ht 5\' 4"  (1.626 m)   Wt 292 lb 6 oz (132.6 kg)   SpO2 97%   BMI 50.19 kg/m    General: NAD, pleasant, able to participate in exam Cardiac: RRR, no murmurs. Respiratory: CTAB, normal effort MSK: Some pain to palpation of the musculature lateral to the spine particularly on the right side.  Negative straight leg raise test on the left or right when performed seated in the chair.  Patient does ambulate with a bit of a limp from right hip pain.  No obvious sites of pain with palpation of the patient's right hip, though limited by habitus. Psych:  Normal affect and mood  ASSESSMENT/PLAN:   Type 2 diabetes mellitus with complication [facility]) Assessment: 55 year old female with a history of type 2 diabetes and previous A1c of 7.5 continues on Metformin 1000 mg twice daily, Jardiance 25 mg daily, insulin/Lantus 44 units daily.  A1c today of 7.0. Plan: -Urine microalbumin collected today -Continue current medications, recheck in 3 months, if A1c is still at goal at that time can discuss reducing medications  Hyperlipidemia associated with type 2 diabetes mellitus [facility]) Assessment/plan: 55 year old female with history of hyperlipidemia for previous LDL of 102 1 year ago.  Will check lipid panel.  Patient continues on Lipitor 40 mg daily.  Right hip pain Assessment: Patient presents for follow-up with right hip pain after falling off of her porch.  She did have x-rays which were negative for fracture at the time but continues to have significant pain with ambulation.  I discussed her case with Dr. 44 and do have concern for possibly an occult fracture.  The ideal imaging modality for this with a negative x-ray would be a CT without contrast.  I discussed this with the patient. Plan:-We will get CT without contrast of the right hip to evaluate for occult fracture -If this is negative we will discuss physical therapy and other conservative treatments to help with her discomfort -Recommend patient follow-up  with me for her other health conditions in the next few weeks.   Health maintenance: -Colonoscopy ordered -We will screen for hepatitis C  Patient is due for a Pap smear but refused this today.  Jackelyn Poling, DO [facility]    This note was prepared using Dragon voice recognition software and may include unintentional dictation errors due to the inherent limitations of voice recognition software.

## 2020-06-21 ENCOUNTER — Other Ambulatory Visit: Payer: Self-pay

## 2020-06-21 ENCOUNTER — Ambulatory Visit: Payer: Medicaid Other | Admitting: Family Medicine

## 2020-06-21 ENCOUNTER — Encounter: Payer: Self-pay | Admitting: Family Medicine

## 2020-06-21 VITALS — BP 108/80 | HR 91 | Ht 64.0 in | Wt 292.4 lb

## 2020-06-21 DIAGNOSIS — W108XXA Fall (on) (from) other stairs and steps, initial encounter: Secondary | ICD-10-CM

## 2020-06-21 DIAGNOSIS — Z1159 Encounter for screening for other viral diseases: Secondary | ICD-10-CM | POA: Diagnosis not present

## 2020-06-21 DIAGNOSIS — M25551 Pain in right hip: Secondary | ICD-10-CM | POA: Insufficient documentation

## 2020-06-21 DIAGNOSIS — E118 Type 2 diabetes mellitus with unspecified complications: Secondary | ICD-10-CM | POA: Diagnosis not present

## 2020-06-21 DIAGNOSIS — E785 Hyperlipidemia, unspecified: Secondary | ICD-10-CM

## 2020-06-21 DIAGNOSIS — Z1211 Encounter for screening for malignant neoplasm of colon: Secondary | ICD-10-CM

## 2020-06-21 DIAGNOSIS — E1169 Type 2 diabetes mellitus with other specified complication: Secondary | ICD-10-CM | POA: Insufficient documentation

## 2020-06-21 LAB — POCT GLYCOSYLATED HEMOGLOBIN (HGB A1C): HbA1c, POC (controlled diabetic range): 7 % (ref 0.0–7.0)

## 2020-06-21 MED ORDER — ATORVASTATIN CALCIUM 40 MG PO TABS: 40.0000 mg | ORAL_TABLET | Freq: Every day | ORAL | 3 refills | Status: DC

## 2020-06-21 NOTE — Assessment & Plan Note (Signed)
Assessment/plan: 55 year old female with history of hyperlipidemia for previous LDL of 102 1 year ago.  Will check lipid panel.  Patient continues on Lipitor 40 mg daily.

## 2020-06-21 NOTE — Assessment & Plan Note (Signed)
Assessment: Patient presents for follow-up with right hip pain after falling off of her porch.  She did have x-rays which were negative for fracture at the time but continues to have significant pain with ambulation.  I discussed her case with Dr. Jennette Kettle and do have concern for possibly an occult fracture.  The ideal imaging modality for this with a negative x-ray would be a CT without contrast.  I discussed this with the patient. Plan:-We will get CT without contrast of the right hip to evaluate for occult fracture -If this is negative we will discuss physical therapy and other conservative treatments to help with her discomfort -Recommend patient follow-up with me for her other health conditions in the next few weeks.

## 2020-06-21 NOTE — Assessment & Plan Note (Signed)
Assessment: 55 year old female with a history of type 2 diabetes and previous A1c of 7.5 continues on Metformin 1000 mg twice daily, Jardiance 25 mg daily, insulin/Lantus 44 units daily.  A1c today of 7.0. Plan: -Urine microalbumin collected today -Continue current medications, recheck in 3 months, if A1c is still at goal at that time can discuss reducing medications

## 2020-06-22 LAB — MICROALBUMIN / CREATININE URINE RATIO
Creatinine, Urine: 22.5 mg/dL
Microalb/Creat Ratio: <13 mg/g creat (ref 0–29)
Microalbumin, Urine: <3 ug/mL

## 2020-06-22 LAB — LIPID PANEL
Chol/HDL Ratio: 3.6 ratio (ref 0.0–4.4)
Cholesterol, Total: 192 mg/dL (ref 100–199)
HDL: 54 mg/dL (ref >39)
LDL Chol Calc (NIH): 115 mg/dL — ABNORMAL HIGH (ref 0–99)
Triglycerides: 131 mg/dL (ref 0–149)
VLDL Cholesterol Cal: 23 mg/dL (ref 5–40)

## 2020-06-22 LAB — HEPATITIS C ANTIBODY: Hep C Virus Ab: <0.1 s/co ratio (ref 0.0–0.9)

## 2020-06-29 ENCOUNTER — Other Ambulatory Visit: Payer: Self-pay

## 2020-06-29 ENCOUNTER — Encounter (HOSPITAL_COMMUNITY): Payer: Self-pay | Admitting: Emergency Medicine

## 2020-06-29 ENCOUNTER — Ambulatory Visit (HOSPITAL_COMMUNITY)
Admission: EM | Admit: 2020-06-29 | Discharge: 2020-06-29 | Disposition: A | Discharge status: Home / Self Care | Payer: Medicaid Other | Attending: Emergency Medicine | Admitting: Emergency Medicine

## 2020-06-29 ENCOUNTER — Ambulatory Visit (HOSPITAL_BASED_OUTPATIENT_CLINIC_OR_DEPARTMENT_OTHER)
Admit: 2020-06-29 | Discharge: 2020-06-29 | Disposition: A | Discharge status: Home / Self Care | Payer: Medicaid Other | Attending: Emergency Medicine | Admitting: Emergency Medicine

## 2020-06-29 DIAGNOSIS — R600 Localized edema: Secondary | ICD-10-CM | POA: Diagnosis not present

## 2020-06-29 DIAGNOSIS — M25561 Pain in right knee: Secondary | ICD-10-CM

## 2020-06-29 NOTE — ED Notes (Signed)
Vascular Ultrasound scheduled for today 06/29/2020 at 2:00 p.m., provider notified.

## 2020-06-29 NOTE — ED Provider Notes (Signed)
[facility]    CSN: [id number] Arrival date & time: 06/29/20  1191      History   Chief Complaint Chief Complaint  Patient presents with  . Extremity Pain    HPI Rita Bass is a 55 y.o. female.   Patient presents with concerns of right knee pain, swelling, and palpable "knots" that she is worried is a blood clot. A few weeks ago the patient fell, landing on her right hip. She has continued to have posterior right hip pain, especially with weight bearing, and saw her PCP for this last week. She had a negative x-ray but there were concerns of possible occult fracture and a hip CT was ordered and is scheduled for 4/19. The patient reports chronic bilateral knee and leg pain, which she attributes to arthritis in part and needs a right knee replacement so she is working on losing weight to be able to have the surgery. The patient states she woke up this morning and noticed increased pain on the side of her right knee, and palpable tender "knots". She is concerned she may have a blood clot. She denies history of blood clots in the past and does not take blood thinners. She reports chronic bilateral leg swelling, possibly increased on the right today. She denies any injury aside from the prior hip injury. The patient denies difficulty breathing, skin discoloration, or numbness/tingling in the leg.   The history is provided by the patient.  Extremity Pain Pertinent negatives include no chest pain, no headaches and no shortness of breath.    Past Medical History:  Diagnosis Date  . Arthritis   . Asthma   . Chronic pain of left knee   . Complication of anesthesia   . GERD (gastroesophageal reflux disease)   . H/O bronchitis   . Kidney stones   . Obesity   . Renal disorder   . Shortness of breath dyspnea   . Sleep apnea in adult    Polysomnogram pending.  Followed by Dr. Craige Cotta    Patient Active Problem List   Diagnosis Date Noted  . Hyperlipidemia associated with  type 2 diabetes mellitus (HCC) 06/21/2020  . Right hip pain 06/21/2020  . Fall (on) (from) other stairs and steps, initial encounter 06/07/2020  . Tinea capitis 06/07/2020  . Orthopnea 01/04/2020  . Chronic cough 12/23/2019  . Right sciatic nerve pain 10/05/2019  . Right flank pain 09/22/2019  . Dyspepsia 05/22/2019  . Essential hypertension 04/07/2019  . Vaginal bleeding 01/27/2019  . Dysuria 01/27/2019  . Low back pain 01/27/2019  . History of umbilical hernia repair 12/22/2018  . Abdominal cramping 09/15/2018  . Medication refill 12/18/2017  . Unilateral primary osteoarthritis, right knee 12/05/2017  . BMI 50.0-59.9, adult (HCC) 12/05/2017  . Diastolic dysfunction with heart failure (HCC) 10/25/2017  . 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 Screen for colon cancer 10/25/2017  . Insomnia 10/25/2017  . Diabetic nephropathy associated with type 2 diabetes mellitus (HCC) 10/25/2017  . Type 2 diabetes mellitus with complication (HCC) 09/13/2017  . At risk for acute ischemic cardiac event 08/16/2017  . Sleep apnea 08/16/2017  . Gastroesophageal reflux disease 08/16/2017  . Chronic pain of right knee 07/13/2016  . Morbid obesity (HCC) 02/04/2015  . Menorrhagia with regular cycle 11/18/2014    Past Surgical History:  Procedure Laterality Date  . CESAREAN SECTION     6 c-sections  . CHOLECYSTECTOMY    . HERNIA REPAIR    . KIDNEY STONE SURGERY    .  OOPHORECTOMY    . TRANSTHORACIC ECHOCARDIOGRAM  09/2017   Technically difficult study.  Did not use Definity contrast.  EF was 55 to 60% with moderate LVH and grade 1 diastolic dysfunction.  No significant valvular lesions noted.  No regional wall motion normality but difficult to assess due to poor imaging.   . TUBAL LIGATION      OB History    Gravida  6   Para  6   Term  5   Preterm  1   AB  0   Living  7     SAB  0   IAB  0   Ectopic  0   Multiple  2   Live Births               Home Medications    Prior to  Admission medications   Medication Sig Start Date End Date Taking? Authorizing Provider  Accu-Chek Softclix Lancets lancets Use as instructed 04/07/19   Garnette Gunnerhompson, Aaron B, MD  acetaminophen (TYLENOL 8 HOUR) 650 MG CR tablet Take 1 tablet (650 mg total) by mouth every 8 (eight) hours as needed for pain. 12/16/17   Garnette Gunnerhompson, Aaron B, MD  albuterol (PROVENTIL) (2.5 MG/3ML) 0.083% nebulizer solution Take 3 mLs (2.5 mg total) by nebulization every 6 (six) hours as needed for wheezing or shortness of breath. 12/25/19   Derrel Nipresenzo, Victor, MD  atorvastatin (LIPITOR) 40 MG tablet Take 1 tablet (40 mg total) by mouth daily. 06/21/20   Jackelyn PolingWelborn, Ryan, DO  cyclobenzaprine (FLEXERIL) 5 MG tablet Take 1-2 tablets (5-10 mg total) by mouth 2 (two) times daily as needed for muscle spasms. Patient not taking: Reported on 06/21/2020 06/07/20   Peggyann ShoalsAnderson, Hannah C, DO  dicyclomine (BENTYL) 10 MG capsule TAKE 1 CAPSULE(10 MG) BY MOUTH FOUR TIMES DAILY BEFORE MEALS AND AT BEDTIME Patient not taking: Reported on 06/21/2020 09/18/19   Melene PlanKim, Rachel E, MD  DULoxetine (CYMBALTA) 60 MG capsule Take 2 capsules (120 mg total) by mouth daily. Patient not taking: Reported on 06/21/2020 03/10/18   Garnette Gunnerhompson, Aaron B, MD  empagliflozin (JARDIANCE) 25 MG TABS tablet Take 25 mg by mouth daily. 04/07/19   Garnette Gunnerhompson, Aaron B, MD  gabapentin (NEURONTIN) 100 MG capsule Take 1 capsule (100 mg total) by mouth 3 (three) times daily. Patient not taking: Reported on 06/21/2020 10/05/19   Jackelyn PolingWelborn, Ryan, DO  glucose blood (ACCU-CHEK AVIVA) test strip Use as instructed 11/14/17   Diallo, Lilia ArgueAbdoulaye, MD  Insulin Glargine (LANTUS) 100 UNIT/ML Solostar Pen Inject 44 Units into the skin daily. Take after largest meal. 05/29/18   Hensel, Santiago BumpersWilliam A, MD  Insulin Pen Needle (PEN NEEDLES 29GX1/2") 29G X 12MM MISC Use to inject Victoza once daily 10/11/17   Moses MannersHensel, William A, MD  ketoconazole (NIZORAL) 2 % shampoo Apply 1 application topically 2 (two) times a week. 06/09/20    Dollene ClevelandAnderson, Hannah C, DO  liraglutide (VICTOZA) 18 MG/3ML SOPN Inject 0.3 mLs (1.8 mg total) into the skin daily. 12/02/17   Moses MannersHensel, William A, MD  metFORMIN (GLUCOPHAGE) 500 MG tablet Take 2 tablets (1,000 mg total) by mouth 2 (two) times daily with a meal. 04/07/19   Garnette Gunnerhompson, Aaron B, MD  mometasone-formoterol (DULERA) 100-5 MCG/ACT AERO Inhale 2 puffs into the lungs daily. 12/23/19   Mardella LaymanHagler, Brian, MD  montelukast (SINGULAIR) 10 MG tablet Take 1 tablet (10 mg total) by mouth at bedtime. 12/25/19   Derrel Nipresenzo, Victor, MD  nystatin (MYCOSTATIN/NYSTOP) powder Apply topically 4 (four) times daily as needed.  Patient not taking: Reported on 10/05/2019 03/24/19   Garnette Gunner, MD  omeprazole (PRILOSEC) 40 MG capsule Take 1 capsule (40 mg total) by mouth daily. 12/23/19   Mardella Layman, MD  ROZEREM 8 MG tablet TAKE 1 TABLET(8 MG) BY MOUTH AT BEDTIME 11/25/17   Garnette Gunner, MD  simethicone (GAS-X) 80 MG chewable tablet Chew 1 tablet (80 mg total) by mouth every 6 (six) hours as needed for flatulence. 09/15/18   Garnette Gunner, MD  terbinafine (LAMISIL) 250 MG tablet Take 1 tablet (250 mg total) by mouth daily. 06/07/20   Dollene Cleveland, DO  tiotropium (SPIRIVA HANDIHALER) 18 MCG inhalation capsule Place 1 capsule (18 mcg total) into inhaler and inhale daily. Inhale two times, once a day Patient not taking: Reported on 10/05/2019 03/24/19   Garnette Gunner, MD  traMADol (ULTRAM) 50 MG tablet Take 2 tablets (100 mg total) by mouth every 6 (six) hours as needed for moderate pain. Patient not taking: Reported on 06/21/2020 03/07/20   Shirlean Mylar, MD  lisinopril (PRINIVIL,ZESTRIL) 10 MG tablet Take 1 tablet (10 mg total) by mouth daily. 10/25/17 12/23/19  Garnette Gunner, MD    Family History Family History  Problem Relation Age of Onset  . Breast cancer Mother   . Other Father        committed suicide  . Heart attack Maternal Aunt     Social History Social History   Tobacco Use  . Smoking  status: Never Smoker  . Smokeless tobacco: Never Used  Vaping Use  . Vaping Use: Never used  Substance Use Topics  . Alcohol use: No  . Drug use: No     Allergies   Patient has no known allergies.   Review of Systems Review of Systems  Constitutional: Negative for fatigue and fever.  Respiratory: Negative for shortness of breath.   Cardiovascular: Positive for leg swelling. Negative for chest pain.  Musculoskeletal: Positive for arthralgias, joint swelling and myalgias.  Skin: Negative for wound.  Neurological: Negative for dizziness, numbness and headaches.     Physical Exam Triage Vital Signs ED Triage Vitals  Enc Vitals Group     BP 06/29/20 1017 133/78     Pulse Rate 06/29/20 1017 81     Resp 06/29/20 1017 18     Temp 06/29/20 1017 98.2 F (36.8 C)     Temp Source 06/29/20 1017 Oral     SpO2 06/29/20 1017 98 %     Weight --      Height --      Head Circumference --      Peak Flow --      Pain Score 06/29/20 1014 4     Pain Loc --      Pain Edu? --      Excl. in GC? --    No data found.  Updated Vital Signs BP 133/78 (BP Location: Left Arm)   Pulse 81   Temp 98.2 F (36.8 C) (Oral)   Resp 18   LMP 04/05/2016   SpO2 98%   Visual Acuity Right Eye Distance:   Left Eye Distance:   Bilateral Distance:    Right Eye Near:   Left Eye Near:    Bilateral Near:     Physical Exam Vitals and nursing note reviewed.  Constitutional:      General: She is not in acute distress. HENT:     Head: Normocephalic.  Eyes:     Pupils: Pupils are  equal, round, and reactive to light.  Cardiovascular:     Rate and Rhythm: Normal rate and regular rhythm.     Heart sounds: Normal heart sounds.  Pulmonary:     Effort: Pulmonary effort is normal.     Breath sounds: Normal breath sounds.  Musculoskeletal:     Right knee: Normal range of motion. Tenderness present. Normal pulse.     Left knee: No tenderness.     Comments: Patient points to lateral soft tissue just  above R knee joint line as main area of concern. Questionable small subcutaneous nodule palpated, though difficult to discern if significantly different than adipose tissue felt in other knee as well. Tenderness to this location. Full ROM but reports increased discomfort with full extension.   Neurological:     Mental Status: She is alert.     Gait: Gait abnormal (antalgic, favoring right).      UC Treatments / Results  Labs (all labs ordered are listed, but only abnormal results are displayed) Labs Reviewed - No data to display  EKG   Radiology No results found.  Procedures Procedures (including critical care time)  Medications Ordered in UC Medications - No data to display  Initial Impression / Assessment and Plan / UC Course  I have reviewed the triage vital signs and the nursing notes.  Pertinent labs & imaging results that were available during my care of the patient were reviewed by me and considered in my medical decision making (see chart for details).     Will order U/S to rule out DVT given patient concern as well as relatively recent injury with questionable/possible occult hip fracture.  Final Clinical Impressions(s) / UC Diagnoses   Final diagnoses:  Acute pain of right knee  Edema of right lower extremity     Discharge Instructions     Ultrasound scheduled today at 2pm to check for blood clot. Follow up with PCP as scheduled.      ED Prescriptions    None     PDMP not reviewed this encounter.   Estanislado Pandy, [place] 06/29/20 1113

## 2020-06-29 NOTE — Discharge Instructions (Addendum)
Ultrasound scheduled today at 2pm to check for blood clot. Follow up with PCP as scheduled.

## 2020-06-29 NOTE — ED Triage Notes (Signed)
Pt presents today with c/o of pain to right lateral knee that she is concerned may be blood clots. She does report a fall onto right hip two weeks ago. Has CT scheduled for 07/05/20 for RLE.

## 2020-06-29 NOTE — Progress Notes (Signed)
RLE venous duplex completed. Preliminary results given to Wess Botts, PA at [phone].  Results can be found under chart review under CV PROC. 06/29/2020 2:07 PM Adriana Lina RVT, RDMS

## 2020-06-30 ENCOUNTER — Other Ambulatory Visit: Payer: Self-pay | Admitting: Family Medicine

## 2020-06-30 DIAGNOSIS — Z1231 Encounter for screening mammogram for malignant neoplasm of breast: Secondary | ICD-10-CM

## 2020-07-02 NOTE — Progress Notes (Signed)
    SUBJECTIVE:   CHIEF COMPLAINT / HPI:   Follow-up to discuss results of DVT ultrasound: Patient is a 55 year old female presents today for follow-up to discuss results of her DVT ultrasound was performed on 06/29/2020.  The results of this ultrasound showed no evidence of DVT in the right lower extremity and no evidence of common femoral vein obstruction in the left.   Cystic structure on left posterior neck Nonpainful to palpation, thinks it's getting bigger. Thinks it's about 1cm in diameter. No erythema or redness.  She denies fevers, night sweats.  She states it does not hurt.  PERTINENT  PMH / PSH: Recent fall landing on her right hip  OBJECTIVE:   BP 129/80   Pulse 71   Ht 5\' 4"  (1.626 m)   Wt 295 lb 9.6 oz (134.1 kg)   LMP 04/05/2016   SpO2 98%   BMI 50.74 kg/m    General: NAD, pleasant, able to participate in exam HEENT: Freely movable 1 cm cystic-like nodule of the left posterior neck with no surface changes, no erythema, nonpainful to palpation Cardiac: RRR, no murmurs. Respiratory: CTAB, normal effort, No wheezes, rales or rhonchi  ASSESSMENT/PLAN:   Nodule of neck Assessment/plan: Freely movable 1 cm cystic-like nodule of the left posterior neck which seems to be below the surface and is not associated with any erythema, pain to palpation, drainage, however the patient does think it is slowly getting bigger.  Differential can include sebaceous cyst, lipoma, reactive lymph node.  Patient has concerns for malignancy as her mother died of breast cancer and would like to have this evaluated because it seems to be growing.   We will get formal ultrasound of the posterior neck to evaluate.  We will follow-up with patient once this is complete   Follow-up of DVT ultrasound of the right leg: Patient presents for results of her DVT ultrasound which was performed at urgent care/emergency department.  This was negative for DVT.  The patient does have a scheduled CT scan of  that right hip after a fall which is scheduled for later today to rule out occult fracture of that hip.  Patient states she will follow-up with me after that CT scan for next steps.  I will call her with the results when they return.  Patient has not yet heard form GI for her colonoscopy.  Per the referral status she is on their list and will be contacted soon.  Patient will follow up with me in a few weeks and if she has not heard from them by then I will reach out to them again.  04/07/2016, DO [facility]    This note was prepared using Dragon voice recognition software and may include unintentional dictation errors due to the inherent limitations of voice recognition software.

## 2020-07-02 NOTE — Patient Instructions (Signed)
It was great to see you! Thank you for allowing me to participate in your care!  I recommend that you always bring your medications to each appointment as this makes it easy to ensure we are on the correct medications and helps [name] not miss when refills are needed.  Our plans for today:  -We are ordering an ultrasound of the neck.  The nurse will give you the schedule for this before you leave. -I will follow-up with you with the results of the CT scan when it returns. -I will see you at your next appointment and we can discuss your colonoscopy and your other concerns.  I do recommend getting her Pap smear at that time.   Take care and seek immediate care sooner if you develop any concerns.   Dr. Jackelyn Poling, DO [facility]

## 2020-07-05 ENCOUNTER — Ambulatory Visit: Payer: Medicaid Other | Admitting: Family Medicine

## 2020-07-05 ENCOUNTER — Other Ambulatory Visit: Payer: Self-pay | Admitting: Family Medicine

## 2020-07-05 ENCOUNTER — Other Ambulatory Visit: Payer: Self-pay

## 2020-07-05 ENCOUNTER — Encounter: Payer: Self-pay | Admitting: Family Medicine

## 2020-07-05 ENCOUNTER — Ambulatory Visit
Admission: RE | Admit: 2020-07-05 | Discharge: 2020-07-05 | Disposition: A | Discharge status: Home / Self Care | Payer: Medicaid Other | Source: Ambulatory Visit | Attending: Family Medicine | Admitting: Family Medicine

## 2020-07-05 VITALS — BP 129/80 | HR 71 | Ht 64.0 in | Wt 295.6 lb

## 2020-07-05 DIAGNOSIS — R221 Localized swelling, mass and lump, neck: Secondary | ICD-10-CM

## 2020-07-05 DIAGNOSIS — W108XXA Fall (on) (from) other stairs and steps, initial encounter: Secondary | ICD-10-CM

## 2020-07-05 DIAGNOSIS — M25551 Pain in right hip: Secondary | ICD-10-CM | POA: Diagnosis not present

## 2020-07-05 DIAGNOSIS — M533 Sacrococcygeal disorders, not elsewhere classified: Secondary | ICD-10-CM | POA: Diagnosis not present

## 2020-07-05 NOTE — Assessment & Plan Note (Signed)
Assessment/plan: Freely movable 1 cm cystic-like nodule of the left posterior neck which seems to be below the surface and is not associated with any erythema, pain to palpation, drainage, however the patient does think it is slowly getting bigger.  Differential can include sebaceous cyst, lipoma, reactive lymph node.  Patient has concerns for malignancy as her mother died of breast cancer and would like to have this evaluated because it seems to be growing.   We will get formal ultrasound of the posterior neck to evaluate.  We will follow-up with patient once this is complete

## 2020-07-06 ENCOUNTER — Ambulatory Visit
Admission: RE | Admit: 2020-07-06 | Discharge: 2020-07-06 | Disposition: A | Discharge status: Home / Self Care | Payer: Medicaid Other | Source: Ambulatory Visit | Attending: *Deleted | Admitting: *Deleted

## 2020-07-06 DIAGNOSIS — R221 Localized swelling, mass and lump, neck: Secondary | ICD-10-CM

## 2020-07-06 DIAGNOSIS — E041 Nontoxic single thyroid nodule: Secondary | ICD-10-CM | POA: Diagnosis not present

## 2020-07-07 ENCOUNTER — Other Ambulatory Visit: Payer: Self-pay | Admitting: Family Medicine

## 2020-07-07 DIAGNOSIS — G8929 Other chronic pain: Secondary | ICD-10-CM

## 2020-07-07 DIAGNOSIS — R221 Localized swelling, mass and lump, neck: Secondary | ICD-10-CM

## 2020-07-07 DIAGNOSIS — M25551 Pain in right hip: Secondary | ICD-10-CM

## 2020-07-07 MED ORDER — TRAMADOL HCL 50 MG PO TABS: 50.0000 mg | ORAL_TABLET | Freq: Four times a day (QID) | ORAL | 0 refills | Status: DC | PRN

## 2020-07-07 NOTE — Progress Notes (Signed)
Called patient to discuss the results of her hip CT as well as the results of her soft tissue neck ultrasound for the nodule on the posterior aspect of her neck.  Hip CT did not show any fracture.  We were holding off on getting physical therapy on board until we were confirmed no fracture.  I discussed this with the patient and she is interested in getting a referral for physical therapy.  The patient request getting a refill of her tramadol which was started after the hip pain.  I discussed with the patient that I am okay giving a single refill for the tramadol but would not be providing additional refills for it.  This will be primarily to help her get an increase in her mobility in the next few days.  I discussed that I do not feel this would be a good long-term medication for her and she expressed her understanding.  She understands that after this prescription I would not be refilling that medication. PDMP reviewed. Will give 50mg  tramadol, 20 count. Patient understands that the purpose of this is to help control her pain as she increases her activity level and that her pain will likely still be present after this runs out but that I will do no additional refills after that. My hope is that by that time she will be established with PT and will have increased her activity level to where her pain is not an issue.   For the ultrasound soft tissue of the neck it did show what appeared to be a borderline enlarged lymph node of 0.9 x 0.5 cm in size.  Recommendation is to consider CT for further evaluation or other modalities if it does not resolve.  I discussed this with the patient that this may represent a reactive lymph node and if such should be improved over the next couple weeks.  Patient does have an appointment with me in the next 2 weeks.  Discussed with the patient that if this is still present at that time we will likely do either further imaging or a biopsy.  Patient expresses her understanding.

## 2020-07-20 NOTE — Patient Instructions (Incomplete)
It was great to see you! Thank you for allowing me to participate in your care!  I recommend that you always bring your medications to each appointment as this makes it easy to ensure we are on the correct medications and helps us not miss when refills are needed.  Our plans for today:  - *** -   We are checking some labs today, I will call you if they are abnormal will send you a MyChart message or a letter if they are normal.  If you do not hear about your labs in the next 2 weeks please let us know.***  Take care and seek immediate care sooner if you develop any concerns.   Dr. Pacen Watford, DO [facility]  

## 2020-07-20 NOTE — Progress Notes (Deleted)
    SUBJECTIVE:   CHIEF COMPLAINT / HPI:   Follow-up-neck nodule: Patient is a 55 year old married female that presents for follow-up after having a neck nodule evaluated via ultrasound.  The plan at the last appointment was to follow-up at this time and see if it was still present, if it is it was recommended that she be sent for a biopsy.  Ultrasound at that time showed a prominent but not technically enlarged (by short axis criteria) nodule consisent with a lymph node. Today she states that***.  PERTINENT  PMH / PSH: ***  OBJECTIVE:   LMP 04/05/2016    General: NAD, pleasant, able to participate in exam Cardiac: RRR, no murmurs. Respiratory: CTAB, normal effort, No wheezes, rales or rhonchi Abdomen: Bowel sounds present, nontender, nondistended, no hepatosplenomegaly. Extremities: no edema or cyanosis. Skin: warm and dry, no rashes noted Neuro: alert, no obvious focal deficits Psych: Normal affect and mood  ASSESSMENT/PLAN:   No problem-specific Assessment & Plan notes found for this encounter.     Jackelyn Poling, DO [facility]    This note was prepared using Dragon voice recognition software and may include unintentional dictation errors due to the inherent limitations of voice recognition software.  {    This will disappear when note is signed, click to select method of visit    :1}

## 2020-07-21 ENCOUNTER — Ambulatory Visit: Payer: Medicaid Other | Admitting: Family Medicine

## 2020-07-26 NOTE — Progress Notes (Deleted)
    SUBJECTIVE:   CHIEF COMPLAINT / HPI:   Follow-up-neck nodule: Patient is a 55 year old female that presents for follow-up after having a neck nodule evaluated via ultrasound.  The plan at the last appointment was to follow-up at this time and see if it was still present, if it is it was recommended that she be sent for a biopsy.  Ultrasound at that time showed a prominent but not technically enlarged (by short axis criteria) nodule consisent with a lymph node. Today she states that***.  PERTINENT  PMH / PSH: ***  OBJECTIVE:   LMP 04/05/2016    General: NAD, pleasant, able to participate in exam Cardiac: RRR, no murmurs. Respiratory: CTAB, normal effort, No wheezes, rales or rhonchi Abdomen: Bowel sounds present, nontender, nondistended, no hepatosplenomegaly. Extremities: no edema or cyanosis. Skin: warm and dry, no rashes noted Neuro: alert, no obvious focal deficits Psych: Normal affect and mood  ASSESSMENT/PLAN:   No problem-specific Assessment & Plan notes found for this encounter.     Jackelyn Poling, DO [facility]    This note was prepared using Dragon voice recognition software and may include unintentional dictation errors due to the inherent limitations of voice recognition software.  {    This will disappear when note is signed, click to select method of visit    :1}

## 2020-07-26 NOTE — Patient Instructions (Incomplete)
It was great to see you! Thank you for allowing me to participate in your care!  I recommend that you always bring your medications to each appointment as this makes it easy to ensure we are on the correct medications and helps us not miss when refills are needed.  Our plans for today:  - *** -   We are checking some labs today, I will call you if they are abnormal will send you a MyChart message or a letter if they are normal.  If you do not hear about your labs in the next 2 weeks please let us know.***  Take care and seek immediate care sooner if you develop any concerns.   Dr. Bridgit Eynon, DO [facility]  

## 2020-07-28 ENCOUNTER — Ambulatory Visit: Payer: Medicaid Other | Admitting: Family Medicine

## 2020-08-03 ENCOUNTER — Ambulatory Visit: Payer: Medicaid Other | Admitting: Obstetrics & Gynecology

## 2020-08-08 ENCOUNTER — Ambulatory Visit: Payer: Medicaid Other | Admitting: Physical Therapy

## 2020-08-10 ENCOUNTER — Ambulatory Visit: Payer: Medicaid Other

## 2020-08-10 ENCOUNTER — Other Ambulatory Visit: Payer: Self-pay | Admitting: *Deleted

## 2020-08-10 MED ORDER — CYCLOBENZAPRINE HCL 5 MG PO TABS: 5.0000 mg | ORAL_TABLET | Freq: Two times a day (BID) | ORAL | 0 refills | Status: DC | PRN

## 2020-08-10 NOTE — Telephone Encounter (Signed)
Patient states she needs a refill on her muscle relaxer because her physical therapy doesn't start until the mid-end of June.  Patient also was confused about a letter she received from [facility] regarding her needing to be scheduled at the hospital for her colonoscopy vs in their suites.  I spoke with Pella and patient just needs to call the nurse and schedule that procedure.  Her BMI doesn't allow for her to be scoped in the office suite.  I called patient and informed her of that.  Attie Nawabi,CMA

## 2020-08-17 NOTE — Progress Notes (Signed)
    SUBJECTIVE:   CHIEF COMPLAINT / HPI:   Follow-up-neck nodule: Patient is a 55 year old female that presents for follow-up after having a neck nodule evaluated via ultrasound.  The plan at the last appointment was to follow-up at this time and see if it was still present, if it is it was recommended that she be sent for a biopsy.  Ultrasound at that time showed a prominent but not technically enlarged (by short axis criteria) nodule consisent with a lymph node. Today she states that it is still present and has not gotten bigger or smaller.  We will plan to refer patient for biopsy.  Discussed case with Dr. Leveda Anna who states he often send people to general surgery for this.  Encounter for Pap smear: Patient is a 56 year old female who presents today for Pap smear. Previous Pap smear performed 5 years ago showed atypical squamous cells of undetermined significance with negative HPV. She states she wants to wait and [facility].   Urinary frequency and urgency Has been going on for 2 days. She feels an urgency but then when she tries to go she isn't able to. She is still able to urinate regularly she states.  PERTINENT  PMH / PSH: History of abnormal Pap smear  OBJECTIVE:   BP (!) 119/53   Pulse 87   Ht 5\' 4"  (1.626 m)   Wt 288 lb 3.2 oz (130.7 kg)   LMP 04/05/2016   SpO2 97%   BMI 49.47 kg/m    General: NAD, pleasant, able to participate in exam HEENT: 1 cm nodule located on the left posterior neck similar to previous exam. Cardiac: RRR, no murmurs.  Respiratory: CTAB, normal effort Abdomen: Bowel sounds present, nontender, nondistended, no suprapubic discomfort Psych: Normal affect and mood  ASSESSMENT/PLAN:   Nodule of neck Assessment: 55 year old female presents for follow-up after a neck nodule which she noted several weeks back.  We did have this evaluated with an ultrasound which showed a prominent but not technically enlarged nodule  consistent with lymph node with recommendation that if it was still present that a biopsy be done.  The nodule/lymph node is still present.  We will send for biopsy.  General surgery referral placed  Abnormal Pap smear of cervix Patient refused Pap smear and states she plans to get it [facility] this [facility].  I did discuss with her the importance of this as her previous one that we have on record from 5 years ago did show atypical squamous cells of undetermined significance.   Urinary frequency/urgency: UA with no leuks, negative nitrite.  Did show some small ketones which may be suggestive of some dehydration.  Patient with no suprapubic discomfort.  No concern for urinary tract infection which was her main concern coming in.  46, DO [facility]    This note was prepared using Dragon voice recognition software and may include unintentional dictation errors due to the inherent limitations of voice recognition software.

## 2020-08-17 NOTE — Patient Instructions (Signed)
It was great to see you! Thank you for allowing me to participate in your care!  I recommend that you always bring your medications to each appointment as this makes it easy to ensure we are on the correct medications and helps [name] not miss when refills are needed.  Our plans for today:  -We checked your urine today for signs of infection it did not show any signs of infection. -I strongly recommend that she do get your Pap smear at the health department as soon as possible.  We can always perform this here in the office if you are interested. -I placed a referral for general surgery for the biopsy of the lymph node on your neck.  They should contact you in the next 1 to 2 weeks to schedule this -Also provided refills per your request.  Take care and seek immediate care sooner if you develop any concerns.   Dr. Jackelyn Poling, DO [facility]

## 2020-08-18 ENCOUNTER — Other Ambulatory Visit: Payer: Self-pay

## 2020-08-18 ENCOUNTER — Ambulatory Visit: Payer: Medicaid Other | Admitting: Family Medicine

## 2020-08-18 ENCOUNTER — Encounter: Payer: Self-pay | Admitting: Family Medicine

## 2020-08-18 ENCOUNTER — Encounter: Payer: Self-pay | Admitting: Gastroenterology

## 2020-08-18 VITALS — BP 119/53 | HR 87 | Ht 64.0 in | Wt 288.2 lb

## 2020-08-18 DIAGNOSIS — R87619 Unspecified abnormal cytological findings in specimens from cervix uteri: Secondary | ICD-10-CM | POA: Diagnosis not present

## 2020-08-18 DIAGNOSIS — R221 Localized swelling, mass and lump, neck: Secondary | ICD-10-CM | POA: Diagnosis not present

## 2020-08-18 DIAGNOSIS — E118 Type 2 diabetes mellitus with unspecified complications: Secondary | ICD-10-CM

## 2020-08-18 DIAGNOSIS — R350 Frequency of micturition: Secondary | ICD-10-CM

## 2020-08-18 LAB — POCT URINALYSIS DIP (MANUAL ENTRY)
Blood, UA: NEGATIVE
Glucose, UA: NEGATIVE mg/dL
Leukocytes, UA: NEGATIVE
Nitrite, UA: NEGATIVE
Protein Ur, POC: NEGATIVE mg/dL
Spec Grav, UA: 1.025 (ref 1.010–1.025)
Urobilinogen, UA: 1 E.U./dL
pH, UA: 6 (ref 5.0–8.0)

## 2020-08-18 MED ORDER — EMPAGLIFLOZIN 25 MG PO TABS: 25.0000 mg | ORAL_TABLET | Freq: Every day | ORAL | 6 refills | Status: DC

## 2020-08-18 MED ORDER — METFORMIN HCL 500 MG PO TABS: 1000.0000 mg | ORAL_TABLET | Freq: Two times a day (BID) | ORAL | 3 refills | Status: DC

## 2020-08-18 MED ORDER — NYSTATIN 100000 UNIT/GM EX POWD: Freq: Four times a day (QID) | CUTANEOUS | 2 refills | Status: DC | PRN

## 2020-08-18 NOTE — Assessment & Plan Note (Signed)
Patient refused Pap smear and states she plans to get it later this month at health department.  I did discuss with her the importance of this as her previous one that we have on record from 5 years ago did show atypical squamous cells of undetermined significance.

## 2020-08-18 NOTE — Assessment & Plan Note (Signed)
Assessment: 55 year old female presents for follow-up after a neck nodule which she noted several weeks back.  We did have this evaluated with an ultrasound which showed a prominent but not technically enlarged nodule consistent with lymph node with recommendation that if it was still present that a biopsy be done.  The nodule/lymph node is still present.  We will send for biopsy.  General surgery referral placed

## 2020-08-19 ENCOUNTER — Ambulatory Visit
Admission: RE | Admit: 2020-08-19 | Discharge: 2020-08-19 | Disposition: A | Discharge status: Home / Self Care | Payer: Medicaid Other | Source: Ambulatory Visit | Attending: Family Medicine | Admitting: Family Medicine

## 2020-08-19 DIAGNOSIS — Z23 Encounter for immunization: Secondary | ICD-10-CM | POA: Diagnosis not present

## 2020-08-19 DIAGNOSIS — Z1231 Encounter for screening mammogram for malignant neoplasm of breast: Secondary | ICD-10-CM

## 2020-08-29 ENCOUNTER — Ambulatory Visit: Payer: Medicaid Other | Admitting: Physical Therapy

## 2020-09-02 ENCOUNTER — Ambulatory Visit: Payer: Medicaid Other | Admitting: Family Medicine

## 2020-09-05 ENCOUNTER — Encounter (HOSPITAL_COMMUNITY): Payer: Self-pay | Admitting: Emergency Medicine

## 2020-09-05 ENCOUNTER — Other Ambulatory Visit: Payer: Self-pay

## 2020-09-05 ENCOUNTER — Ambulatory Visit (HOSPITAL_COMMUNITY)
Admission: EM | Admit: 2020-09-05 | Discharge: 2020-09-05 | Disposition: A | Discharge status: Home / Self Care | Payer: Medicaid Other | Attending: Internal Medicine | Admitting: Internal Medicine

## 2020-09-05 DIAGNOSIS — Z87442 Personal history of urinary calculi: Secondary | ICD-10-CM | POA: Diagnosis not present

## 2020-09-05 DIAGNOSIS — R109 Unspecified abdominal pain: Secondary | ICD-10-CM | POA: Diagnosis not present

## 2020-09-05 LAB — POCT URINALYSIS DIPSTICK, ED / UC
Bilirubin Urine: NEGATIVE
Glucose, UA: NEGATIVE mg/dL
Hgb urine dipstick: NEGATIVE
Ketones, ur: NEGATIVE mg/dL
Leukocytes,Ua: NEGATIVE
Nitrite: NEGATIVE
Protein, ur: NEGATIVE mg/dL
Specific Gravity, Urine: <=1.005 (ref 1.005–1.030)
Urobilinogen, UA: 0.2 mg/dL (ref 0.0–1.0)
pH: 6 (ref 5.0–8.0)

## 2020-09-05 MED ORDER — TAMSULOSIN HCL 0.4 MG PO CAPS: 0.4000 mg | ORAL_CAPSULE | Freq: Every day | ORAL | 0 refills | Status: DC

## 2020-09-05 MED ORDER — TRAMADOL HCL 50 MG PO TABS: 50.0000 mg | ORAL_TABLET | Freq: Four times a day (QID) | ORAL | 0 refills | Status: DC | PRN

## 2020-09-05 NOTE — ED Provider Notes (Signed)
[facility]   MRN: [id number] DOB: 1965-05-01  Subjective:   R[facility] is a 55 y.o. female presenting for 2 day history of acute onset right sided lower lateral/flank pain that radiates anteriorly toward the groin. Is using Tylenol for her pain with very temporary relief.  Denies fever, nausea, vomiting, dysuria, urinary frequency, hematuria, falls, trauma.  She does have a history of kidney stones, had to have surgical removal years ago.  Reports that this feels very similar.  She also has a history of right-sided sciatic nerve pain.  She has not seen a urologist for years.  Has used tramadol in the past without any issues.  No current facility-administered medications for this encounter.  Current Outpatient Medications:    Accu-Chek Softclix Lancets lancets, Use as instructed, Disp: 100 each, Rfl: 12   acetaminophen (TYLENOL 8 HOUR) 650 MG CR tablet, Take 1 tablet (650 mg total) by mouth every 8 (eight) hours as needed for pain., Disp: 60 tablet, Rfl: 3   albuterol (PROVENTIL) (2.5 MG/3ML) 0.083% nebulizer solution, Take 3 mLs (2.5 mg total) by nebulization every 6 (six) hours as needed for wheezing or shortness of breath., Disp: 150 mL, Rfl: 1   empagliflozin (JARDIANCE) 25 MG TABS tablet, Take 1 tablet (25 mg total) by mouth daily., Disp: 30 tablet, Rfl: 6   glucose blood (ACCU-CHEK AVIVA) test strip, Use as instructed, Disp: 100 each, Rfl: 12   Insulin Glargine (LANTUS) 100 UNIT/ML Solostar Pen, Inject 44 Units into the skin daily. Take after largest meal., Disp: 15 mL, Rfl: 11   Insulin Pen Needle (PEN NEEDLES 29GX1/2") 29G X MISC, Use to inject Victoza once daily, Disp: 30 each, Rfl: 1   liraglutide (VICTOZA) 18 MG/3ML SOPN, Inject 0.3 mLs (1.8 mg total) into the skin daily., Disp: 3 pen, Rfl: 5   metFORMIN (GLUCOPHAGE) 500 MG tablet, Take 2 tablets (1,000 mg total) by mouth 2 (two) times daily with a meal., Disp: 180 tablet, Rfl: 3   atorvastatin  (LIPITOR) 40 MG tablet, Take 1 tablet (40 mg total) by mouth daily., Disp: 90 tablet, Rfl: 3   cyclobenzaprine (FLEXERIL) 5 MG tablet, Take 1 tablet (5 mg total) by mouth 2 (two) times daily as needed for muscle spasms., Disp: 20 tablet, Rfl: 0   dicyclomine (BENTYL) 10 MG capsule, TAKE 1 CAPSULE(10 MG) BY MOUTH FOUR TIMES DAILY BEFORE MEALS AND AT BEDTIME (Patient not taking: Reported on 06/21/2020), Disp: 120 capsule, Rfl: 0   DULoxetine (CYMBALTA) 60 MG capsule, Take 2 capsules (120 mg total) by mouth daily. (Patient not taking: Reported on 06/21/2020), Disp: 30 capsule, Rfl: 3   gabapentin (NEURONTIN) 100 MG capsule, Take 1 capsule (100 mg total) by mouth 3 (three) times daily. (Patient not taking: Reported on 06/21/2020), Disp: 90 capsule, Rfl: 3   ketoconazole (NIZORAL) 2 % shampoo, Apply 1 application topically 2 (two) times a week., Disp: 120 mL, Rfl: 0   mometasone-formoterol (DULERA) 100-5 MCG/ACT AERO, Inhale 2 puffs into the lungs daily., Disp: 13 g, Rfl: 2   montelukast (SINGULAIR) 10 MG tablet, Take 1 tablet (10 mg total) by mouth at bedtime., Disp: 30 tablet, Rfl: 3   nystatin (MYCOSTATIN/NYSTOP) powder, Apply topically 4 (four) times daily as needed., Disp: 60 g, Rfl: 2   omeprazole (PRILOSEC) 40 MG capsule, Take 1 capsule (40 mg total) by mouth daily., Disp: 30 capsule, Rfl: 1   ROZEREM 8 MG tablet, TAKE 1 TABLET(8 MG) BY MOUTH AT BEDTIME, Disp:  90 tablet, Rfl: 3   simethicone (GAS-X) 80 MG chewable tablet, Chew 1 tablet (80 mg total) by mouth every 6 (six) hours as needed for flatulence., Disp: 30 tablet, Rfl: 0   terbinafine (LAMISIL) 250 MG tablet, Take 1 tablet (250 mg total) by mouth daily., Disp: 30 tablet, Rfl: 1   tiotropium (SPIRIVA HANDIHALER) 18 MCG inhalation capsule, Place 1 capsule (18 mcg total) into inhaler and inhale daily. Inhale two times, once a day (Patient not taking: Reported on 10/05/2019), Disp: 30 capsule, Rfl: 12   traMADol (ULTRAM) 50 MG tablet, Take 1 tablet (50  mg total) by mouth every 6 (six) hours as needed for moderate pain. No additional refills., Disp: 20 tablet, Rfl: 0   No Known Allergies  Past Medical History:  Diagnosis Date   Arthritis    Asthma    Chronic pain of left knee    Complication of anesthesia    GERD (gastroesophageal reflux disease)    H/O bronchitis    Kidney stones    Obesity    Renal disorder    Shortness of breath dyspnea    Sleep apnea in adult    Polysomnogram pending.  Followed by Dr. Craige Cotta     Past Surgical History:  Procedure Laterality Date   CESAREAN SECTION     6 c-sections   CHOLECYSTECTOMY     HERNIA REPAIR     KIDNEY STONE SURGERY     OOPHORECTOMY     TRANSTHORACIC ECHOCARDIOGRAM  09/2017   Technically difficult study.  Did not use Definity contrast.  EF was 55 to 60% with moderate LVH and grade 1 diastolic dysfunction.  No significant valvular lesions noted.  No regional wall motion normality but difficult to assess due to poor imaging.    TUBAL LIGATION      Family History  Problem Relation Age of Onset   Breast cancer Mother    Other Father        committed suicide   Heart attack Maternal Aunt     Social History   Tobacco Use   Smoking status: Never   Smokeless tobacco: Never  Vaping Use   Vaping Use: Never used  Substance Use Topics   Alcohol use: No   Drug use: No    ROS   Objective:   Vitals: BP 139/88 (BP Location: Right Arm)   Pulse 72   Temp 98.4 F (36.9 C) (Oral)   LMP 04/05/2016   SpO2 100%   Physical Exam Constitutional:      General: She is not in acute distress.    Appearance: Normal appearance. She is well-developed. She is obese. She is not ill-appearing, toxic-appearing or diaphoretic.  HENT:     Head: Normocephalic and atraumatic.     Right Ear: External ear normal.     Left Ear: External ear normal.     Nose: Nose normal.     Mouth/Throat:     Mouth: Mucous membranes are moist.     Pharynx: Oropharynx is clear.  Eyes:     General: No  scleral icterus.    Extraocular Movements: Extraocular movements intact.     Pupils: Pupils are equal, round, and reactive to light.  Cardiovascular:     Rate and Rhythm: Normal rate and regular rhythm.     Heart sounds: Normal heart sounds. No murmur heard.   No friction rub. No gallop.  Pulmonary:     Effort: Pulmonary effort is normal. No respiratory distress.  Breath sounds: Normal breath sounds. No stridor. No wheezing, rhonchi or rales.  Abdominal:     General: Bowel sounds are normal. There is no distension.     Palpations: Abdomen is soft. There is no mass.     Tenderness: There is abdominal tenderness (right flank side). There is no right CVA tenderness, left CVA tenderness, guarding or rebound.  Skin:    General: Skin is warm and dry.     Coloration: Skin is not pale.     Findings: No rash.  Neurological:     General: No focal deficit present.     Mental Status: She is alert and oriented to person, place, and time.  Psychiatric:        Mood and Affect: Mood normal.        Behavior: Behavior normal.        Thought Content: Thought content normal.        Judgment: Judgment normal.    Results for orders placed or performed during the hospital encounter of 09/05/20 (from the past 24 hour(s))  POCT Urinalysis Dipstick     Status: None   Collection Time: 09/05/20  1:37 PM  Result Value Ref Range   Glucose, UA NEGATIVE NEGATIVE mg/dL   Bilirubin Urine NEGATIVE NEGATIVE   Ketones, ur NEGATIVE NEGATIVE mg/dL   Specific Gravity, Urine <=1.005 1.005 - 1.030   Hgb urine dipstick NEGATIVE NEGATIVE   pH 6.0 5.0 - 8.0   Protein, ur NEGATIVE NEGATIVE mg/dL   Urobilinogen, UA 0.2 0.0 - 1.0 mg/dL   Nitrite NEGATIVE NEGATIVE   Leukocytes,Ua NEGATIVE NEGATIVE    Assessment and Plan :   PDMP not reviewed this encounter.  1. Right flank pain   2. History of urinary stone     Extensive chart review performed including recent labs and imaging studies.  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  Recommended conservative management with continued hydration, start Flomax, scheduled Tylenol with tramadol for breakthrough pain.  Follow-up with urology as soon as possible.  Maintain strict ER precautions should she develop obstructive uropathy. Counseled patient on potential for adverse effects with medications prescribed today, patient verbalized understanding.    Wallis Bamberg, [place] 09/05/20 1354

## 2020-09-05 NOTE — ED Triage Notes (Signed)
Patient c/o low back pain for a couple days, more on the right side of her lower back.  The pain radiates around to the front side.  Pt does have a history of kidney stones and it feels like that.  No hematuria.  Taking Tylenol for pain.

## 2020-09-08 ENCOUNTER — Ambulatory Visit: Payer: Medicaid Other | Admitting: Family Medicine

## 2020-09-12 ENCOUNTER — Ambulatory Visit: Payer: Medicaid Other | Admitting: Family Medicine

## 2020-09-12 NOTE — Progress Notes (Deleted)
    SUBJECTIVE:   CHIEF COMPLAINT / HPI:   Follow-up-urgent care-right flank pain: Patient was evaluated in urgent care on 09/05/2020 for concern of a renal stone.  It was recommended that she continue with conservative management with continued hydration, starting Flomax, scheduled Tylenol and tramadol for breakthrough pain.  Is also recommend that she follow-up with urology as soon as possible.  Today she states***.  Follow-up: Enlarged lymph node on posterior neck: Previously I had referred the patient to surgery as the lymph node had not decreased in size.  The last documented note in the referral from 6/3 states that surgery will reach out to the patient to schedule the appointment.  She states***.  PERTINENT  PMH / PSH: ***  OBJECTIVE:   LMP 04/05/2016  ***  General: NAD, pleasant, able to participate in exam Cardiac: RRR, no murmurs. Respiratory: CTAB, normal effort, No wheezes, rales or rhonchi Abdomen: Bowel sounds present, nontender, nondistended, no hepatosplenomegaly. Extremities: no edema or cyanosis. Skin: warm and dry, no rashes noted Neuro: alert, no obvious focal deficits Psych: Normal affect and mood  ASSESSMENT/PLAN:   No problem-specific Assessment & Plan notes found for this encounter.     Jackelyn Poling, DO [facility]    {    This will disappear when note is signed, click to select method of visit    :1}

## 2020-09-16 ENCOUNTER — Ambulatory Visit (INDEPENDENT_AMBULATORY_CARE_PROVIDER_SITE_OTHER): Payer: Medicaid Other | Admitting: Gastroenterology

## 2020-09-16 ENCOUNTER — Encounter: Payer: Self-pay | Admitting: Gastroenterology

## 2020-09-16 ENCOUNTER — Other Ambulatory Visit (INDEPENDENT_AMBULATORY_CARE_PROVIDER_SITE_OTHER): Payer: Medicaid Other

## 2020-09-16 VITALS — BP 142/80 | HR 92 | Ht 62.5 in | Wt 285.8 lb

## 2020-09-16 DIAGNOSIS — R112 Nausea with vomiting, unspecified: Secondary | ICD-10-CM | POA: Insufficient documentation

## 2020-09-16 DIAGNOSIS — Z6841 Body Mass Index (BMI) 40.0 and over, adult: Secondary | ICD-10-CM

## 2020-09-16 DIAGNOSIS — K529 Noninfective gastroenteritis and colitis, unspecified: Secondary | ICD-10-CM | POA: Insufficient documentation

## 2020-09-16 DIAGNOSIS — Z8719 Personal history of other diseases of the digestive system: Secondary | ICD-10-CM | POA: Diagnosis not present

## 2020-09-16 DIAGNOSIS — R1900 Intra-abdominal and pelvic swelling, mass and lump, unspecified site: Secondary | ICD-10-CM | POA: Insufficient documentation

## 2020-09-16 LAB — BASIC METABOLIC PANEL
BUN: 13 mg/dL (ref 6–23)
CO2: 27 mEq/L (ref 19–32)
Calcium: 9.7 mg/dL (ref 8.4–10.5)
Chloride: 102 mEq/L (ref 96–112)
Creatinine, Ser: 0.61 mg/dL (ref 0.40–1.20)
GFR: 102.43 mL/min (ref >60.00)
Glucose, Bld: 144 mg/dL — ABNORMAL HIGH (ref 70–99)
Potassium: 4.1 mEq/L (ref 3.5–5.1)
Sodium: 139 mEq/L (ref 135–145)

## 2020-09-16 MED ORDER — CHOLESTYRAMINE 4 G PO PACK: 4.0000 g | PACK | Freq: Every day | ORAL | 5 refills | Status: DC

## 2020-09-16 MED ORDER — CHOLESTYRAMINE 4 G PO PACK: 4.0000 g | PACK | Freq: Every day | ORAL | 12 refills | Status: DC

## 2020-09-16 NOTE — Progress Notes (Signed)
09/16/2020 Rita Bass [id number] 1965/11/28   HISTORY OF PRESENT ILLNESS: This is a 55 year old female who is new to our office.  She has been referred here by Dr. Jennette Kettle for colorectal cancer screening.  She never had colonoscopy in the past.  She reports chronic ongoing issues with diarrhea.  She says for years she has been having diarrhea, a lot of times after eating meals.  She says that it has gotten to the point where she does not go out to eat anymore because of this.  She denies seeing any blood in her stools.  She reports generalized abdominal pain and discomfort that is related to a lot of gas and bloating.  She says that when she eats she gets really gassy and bloated.  She says that sometimes it causes so much pressure and it makes her vomit.  She says that once she vomits then she feels somewhat better.  She uses Gas-X/simethicone for the gas and bloating and that does seem to help, but she uses a lot of it.  She denies any overt heartburn/reflux.  She has only been diabetic for a couple of years.  She used to weigh about 400 pounds.  Most recent hemoglobin A1c was 7.0.   Past Medical History:  Diagnosis Date   Arthritis    Asthma    Chronic pain of left knee    Complication of anesthesia    Diabetes (HCC)    GERD (gastroesophageal reflux disease)    H/O bronchitis    Kidney stones    Obesity    Renal disorder    Shortness of breath dyspnea    Sleep apnea in adult    Polysomnogram pending.  Followed by Dr. Craige Cotta   Past Surgical History:  Procedure Laterality Date   CESAREAN SECTION     6 c-sections   CHOLECYSTECTOMY     HERNIA REPAIR     KIDNEY STONE SURGERY     OOPHORECTOMY     TRANSTHORACIC ECHOCARDIOGRAM  09/2017   Technically difficult study.  Did not use Definity contrast.  EF was 55 to 60% with moderate LVH and grade 1 diastolic dysfunction.  No significant valvular lesions noted.  No regional wall motion normality but difficult to assess due to poor  imaging.    TUBAL LIGATION      reports that she has never smoked. She has never used smokeless tobacco. She reports that she does not drink alcohol and does not use drugs. family history includes Breast cancer in her mother; Diabetes in her father; Heart attack in her maternal aunt; Other in her father. No Known Allergies    Outpatient Encounter Medications as of 09/16/2020  Medication Sig   Accu-Chek Softclix Lancets lancets Use as instructed   acetaminophen (TYLENOL 8 HOUR) 650 MG CR tablet Take 1 tablet (650 mg total) by mouth every 8 (eight) hours as needed for pain.   albuterol (PROVENTIL) (2.5 MG/3ML) 0.083% nebulizer solution Take 3 mLs (2.5 mg total) by nebulization every 6 (six) hours as needed for wheezing or shortness of breath.   empagliflozin (JARDIANCE) 25 MG TABS tablet Take 1 tablet (25 mg total) by mouth daily.   glucose blood (ACCU-CHEK AVIVA) test strip Use as instructed   metFORMIN (GLUCOPHAGE) 500 MG tablet Take 2 tablets (1,000 mg total) by mouth 2 (two) times daily with a meal.   mometasone-formoterol (DULERA) 100-5 MCG/ACT AERO Inhale 2 puffs into the lungs daily.   montelukast (SINGULAIR) 10 MG tablet  Take 1 tablet (10 mg total) by mouth at bedtime.   ROZEREM 8 MG tablet TAKE 1 TABLET(8 MG) BY MOUTH AT BEDTIME   simethicone (GAS-X) 80 MG chewable tablet Chew 1 tablet (80 mg total) by mouth every 6 (six) hours as needed for flatulence.   tiotropium (SPIRIVA HANDIHALER) 18 MCG inhalation capsule Place 1 capsule (18 mcg total) into inhaler and inhale daily. Inhale two times, once a day   [DISCONTINUED] atorvastatin (LIPITOR) 40 MG tablet Take 1 tablet (40 mg total) by mouth daily.   [DISCONTINUED] cyclobenzaprine (FLEXERIL) 5 MG tablet Take 1 tablet (5 mg total) by mouth 2 (two) times daily as needed for muscle spasms.   [DISCONTINUED] dicyclomine (BENTYL) 10 MG capsule TAKE 1 CAPSULE(10 MG) BY MOUTH FOUR TIMES DAILY BEFORE MEALS AND AT BEDTIME   [DISCONTINUED] DULoxetine  (CYMBALTA) 60 MG capsule Take 2 capsules (120 mg total) by mouth daily.   [DISCONTINUED] gabapentin (NEURONTIN) 100 MG capsule Take 1 capsule (100 mg total) by mouth 3 (three) times daily.   [DISCONTINUED] Insulin Glargine (LANTUS) 100 UNIT/ML Solostar Pen Inject 44 Units into the skin daily. Take after largest meal.   [DISCONTINUED] Insulin Pen Needle (PEN NEEDLES 29GX1/2") 29G X MISC Use to inject Victoza once daily (Patient not taking: Reported on 09/16/2020)   [DISCONTINUED] ketoconazole (NIZORAL) 2 % shampoo Apply 1 application topically 2 (two) times a week.   [DISCONTINUED] liraglutide (VICTOZA) 18 MG/3ML SOPN Inject 0.3 mLs (1.8 mg total) into the skin daily.   [DISCONTINUED] lisinopril (PRINIVIL,ZESTRIL) 10 MG tablet Take 1 tablet (10 mg total) by mouth daily.   [DISCONTINUED] nystatin (MYCOSTATIN/NYSTOP) powder Apply topically 4 (four) times daily as needed.   [DISCONTINUED] omeprazole (PRILOSEC) 40 MG capsule Take 1 capsule (40 mg total) by mouth daily.   [DISCONTINUED] tamsulosin (FLOMAX) 0.4 MG CAPS capsule Take 1 capsule (0.4 mg total) by mouth daily after supper.   [DISCONTINUED] terbinafine (LAMISIL) 250 MG tablet Take 1 tablet (250 mg total) by mouth daily.   [DISCONTINUED] traMADol (ULTRAM) 50 MG tablet Take 1 tablet (50 mg total) by mouth every 6 (six) hours as needed.   No facility-administered encounter medications on file as of 09/16/2020.     REVIEW OF SYSTEMS  : All other systems reviewed and negative except where noted in the History of Present Illness.   PHYSICAL EXAM: BP (!) 142/80   Pulse 92   Ht 5' 2.5" (1.588 m) Comment: without shoes  Wt 285 lb 12.8 oz (129.6 kg)   LMP 04/05/2016   BMI 51.44 kg/m  General: Well developed white female in no acute distress Head: Normocephalic and atraumatic Eyes:  Sclerae anicteric, conjunctiva pink. Ears: Normal auditory acuity Lungs: Clear throughout to auscultation; no W/R/R. Heart: Regular rate and rhythm; no  M/R/G. Abdomen: Soft, non-distended.  BS present.  Firm large bulge noted on mid-abdomen.  Overall non-tender. Rectal:  Will be done at the time of colonoscopy. Musculoskeletal: Symmetrical with no gross deformities  Skin: No lesions on visible extremities Extremities: No edema  Neurological: Alert oriented x 4, grossly non-focal Psychological:  Alert and cooperative. Normal mood and affect  ASSESSMENT AND PLAN: *Chronic diarrhea: Possibly bile salt related status post cholecystectomy as a large component of her diarrhea tends to be postprandial.  She is also on metformin, which could be contributing.  She never had colonoscopy in the past.  We will plan for colonoscopy at the hospital Dr. Barron Alvine due to BMI greater than 50.  Consider random biopsies to rule  out microscopic colitis.  In the interim she is going to try Questran once daily at lunchtime.  Prescription sent to pharmacy. *Mid abdominal mass/bulge: Has a history of ventral hernia that had been seen on CT scan years ago (2018), but this is very firm and unusual feeling.  She says that she has really just noticed it over the past couple of months.  We will repeat a CT scan of abdomen and pelvis with contrast.  Check BMP prior to CT scan. *Intermittent nausea and vomiting: Also reports a lot of bloating with fullness and excessive gas.  Simethicone helps the gas, but a lot of times when she feels like this she has vomiting episodes.  We will plan for EGD with Dr. Barron Alvine as well.  No complaints of overt heartburn/reflux.  **The risks, benefits, and alternatives to EGD and colonoscopy were discussed with the patient and she consents to proceed.    CC:  Jackelyn Poling, DO CC:  Dr. Jennette Kettle

## 2020-09-16 NOTE — Patient Instructions (Addendum)
If you are age 53 or older, your body mass index should be between 23-30. Your Body mass index is 51.44 kg/m. If this is out of the aforementioned range listed, please consider follow up with your Primary Care Provider.  If you are age 7 or younger, your body mass index should be between 19-25. Your Body mass index is 51.44 kg/m. If this is out of the aformentioned range listed, please consider follow up with your Primary Care Provider.   You have been scheduled for an endoscopy and colonoscopy. Please follow the written instructions given to you at your visit today. Please pick up your prep supplies at the pharmacy within the next 1-3 days. If you use inhalers (even only as needed), please bring them with you on the day of your procedure.  Your provider has requested that you go to the basement level for lab work before leaving today. Press "B" on the elevator. The lab is located at the first door on the left as you exit the elevator.   You have been scheduled for a CT scan of the abdomen and pelvis at [facility], 1st floor Radiology. You are scheduled on 09/30/20  at 7:30am. You should arrive 15 minutes prior to your appointment time for registration.  Please pick up 2 bottles of contrast from [place] at least 3 days prior to your scan. The solution may taste better if refrigerated, but do NOT add ice or any other liquid to this solution. Shake well before drinking.   Please follow the written instructions below on the day of your exam:   1) Do not eat anything after 3:30am (4 hours prior to your test)   2) Drink 1 bottle of contrast @ 5:30am (2 hours prior to your exam)  Remember to shake well before drinking and do NOT pour over ice.     Drink 1 bottle of contrast @ 6:30am (1 hour prior to your exam)   You may take any medications as prescribed with a small amount of water, if necessary. If you take any of the following medications: METFORMIN, GLUCOPHAGE, GLUCOVANCE, AVANDAMET,  RIOMET, FORTAMET, [place] MET, JANUMET, GLUMETZA or METAGLIP, you MAY be asked to HOLD this medication 48 hours AFTER the exam.   The purpose of you drinking the oral contrast is to aid in the visualization of your intestinal tract. The contrast solution may cause some diarrhea. Depending on your individual set of symptoms, you may also receive an intravenous injection of x-ray contrast/dye. Plan on being at [facility] for 45 minutes or longer, depending on the type of exam you are having performed.   If you have any questions regarding your exam or if you need to reschedule, you may call [facility] at [phone] between the hours of 8:00 am and 5:00 pm, Monday-Friday.    The [facility] providers would like to encourage you to use [facility] to communicate with providers for non-urgent requests or questions.  Due to long hold times on the telephone, sending your provider a message by [facility] may be a faster and more efficient way to get a response.  Please allow 48 business hours for a response.  Please remember that this is for non-urgent requests.   It was a pleasure to see you today!  Thank you for trusting me with your gastrointestinal care!     Alonza Bogus, PA-C

## 2020-09-19 NOTE — Progress Notes (Deleted)
    SUBJECTIVE:   CHIEF COMPLAINT / HPI:   Follow-up-urgent care-right flank pain: Patient was evaluated in urgent care on 09/05/2020 for concern of a renal stone.  It was recommended that she continue with conservative management with continued hydration, starting Flomax, scheduled Tylenol and tramadol for breakthrough pain.  Is also recommend that she follow-up with urology as soon as possible.  Today she states***.  Follow-up: Enlarged lymph node on posterior neck: Previously I had referred the patient to surgery as the lymph node had not decreased in size.  The last documented note in the referral from 6/3 states that surgery will reach out to the patient to schedule the appointment.  She states***.  PERTINENT  PMH / PSH: ***  OBJECTIVE:   LMP 04/05/2016  ***  General: NAD, pleasant, able to participate in exam Cardiac: RRR, no murmurs. Respiratory: CTAB, normal effort, No wheezes, rales or rhonchi Abdomen: Bowel sounds present, nontender, nondistended, no hepatosplenomegaly. Extremities: no edema or cyanosis. Skin: warm and dry, no rashes noted Neuro: alert, no obvious focal deficits Psych: Normal affect and mood  ASSESSMENT/PLAN:   No problem-specific Assessment & Plan notes found for this encounter.     Rita Correnti, DO [facility]    {    This will disappear when note is signed, click to select method of visit    :1}  

## 2020-09-20 ENCOUNTER — Encounter: Payer: Self-pay | Admitting: Physical Therapy

## 2020-09-20 ENCOUNTER — Ambulatory Visit: Payer: Medicaid Other | Admitting: Family Medicine

## 2020-09-20 ENCOUNTER — Ambulatory Visit: Payer: Medicaid Other | Attending: Family Medicine | Admitting: Physical Therapy

## 2020-09-20 ENCOUNTER — Other Ambulatory Visit: Payer: Self-pay

## 2020-09-20 DIAGNOSIS — G8929 Other chronic pain: Secondary | ICD-10-CM | POA: Diagnosis not present

## 2020-09-20 DIAGNOSIS — R2689 Other abnormalities of gait and mobility: Secondary | ICD-10-CM | POA: Insufficient documentation

## 2020-09-20 DIAGNOSIS — M6281 Muscle weakness (generalized): Secondary | ICD-10-CM | POA: Diagnosis not present

## 2020-09-20 DIAGNOSIS — M25551 Pain in right hip: Secondary | ICD-10-CM | POA: Diagnosis not present

## 2020-09-20 DIAGNOSIS — M25561 Pain in right knee: Secondary | ICD-10-CM | POA: Insufficient documentation

## 2020-09-20 NOTE — Patient Instructions (Signed)
Access Code: [email or web address] URL: [email or web address] Date: 09/20/2020 Prepared by: Rosana Hoes  Exercises Seated Long Arc Quad - 1 x daily - 7 x weekly - 2 sets - 10 reps Hooklying Clamshell with Resistance - 1 x daily - 7 x weekly - 2 sets - 10 reps Supine March with Resistance Band - 1 x daily - 7 x weekly - 2 sets - 10 reps Active Straight Leg Raise with Quad Set - 1 x daily - 7 x weekly - 2 sets - 10 reps Supine Heel Slide - 1 x daily - 7 x weekly - 2 sets - 10 reps Bridge - 1 x daily - 7 x weekly - 2 sets - 10 reps

## 2020-09-21 ENCOUNTER — Encounter: Payer: Self-pay | Admitting: Physical Therapy

## 2020-09-21 ENCOUNTER — Telehealth: Payer: Self-pay

## 2020-09-21 DIAGNOSIS — Z01419 Encounter for gynecological examination (general) (routine) without abnormal findings: Secondary | ICD-10-CM | POA: Diagnosis not present

## 2020-09-21 DIAGNOSIS — Z3009 Encounter for other general counseling and advice on contraception: Secondary | ICD-10-CM | POA: Diagnosis not present

## 2020-09-21 NOTE — Therapy (Addendum)
[place], [place], [address] Phone: [phone]   Fax:  [phone]  Physical Therapy Evaluation / Discharge  Patient Details  Name: Rita Bass MRN: [id number] Date of Birth: 12-May-1965 Referring Provider (PT): Kinnie Feil, MD   Encounter Date: 09/20/2020   PT End of Session - 09/20/20 1648     Visit Number 1    Number of Visits 6    Date for PT Re-Evaluation 11/01/20    Authorization Type [employer]    PT Start Time 1650    PT Stop Time 1730    PT Time Calculation (min) 40 min    Activity Tolerance Patient tolerated treatment well    Behavior During Therapy WFL for tasks assessed/performed             Past Medical History:  Diagnosis Date   Arthritis    Asthma    Chronic pain of left knee    Complication of anesthesia    Diabetes ([place])    GERD (gastroesophageal reflux disease)    H/O bronchitis    Kidney stones    Obesity    Renal disorder    Shortness of breath dyspnea    Sleep apnea in adult    Polysomnogram pending.  Followed by Dr. Halford Chessman    Past Surgical History:  Procedure Laterality Date   CESAREAN SECTION     6 c-sections   CHOLECYSTECTOMY     HERNIA REPAIR     KIDNEY STONE SURGERY     OOPHORECTOMY     TRANSTHORACIC ECHOCARDIOGRAM  09/2017   Technically difficult study.  Did not use Definity contrast.  EF was 55 to 60% with moderate LVH and grade 1 diastolic dysfunction.  No significant valvular lesions noted.  No regional wall motion normality but difficult to assess due to poor imaging.    TUBAL LIGATION      There were no vitals filed for this visit.    Subjective Assessment - 09/20/20 1651     Subjective Patient reports problems with bilateral knees (right worse than left) and right hip. She had a fall a few months back which worsened her symptoms especially hip, but these things have been going on for years. Patient states that driving with  the right leg can cause shooting pains in the right thigh, also patient reports worsening pain at night that limits her ability to sleep. Patient states she has a hard time going up and down stairs, and limited ability to walk or stand extended periods. Patient reports she needs a replacement for the right knee but is working to lose weight to get this. She does have a history of low back pain as well which can cause her problems, but mostly reports right knee and hip pain currently.    Limitations Sitting;Lifting;Standing;Walking;House hold activities    How long can you sit comfortably? 20-30 minutes    How long can you stand comfortably? 10 minutes    How long can you walk comfortably? "can walk around [employer] but is hurting, can push through pain 6/10"    Diagnostic tests X-ray    Patient Stated Goals Get rid of pain to help improve sleep    Currently in Pain? Yes    Pain Score 5    pain at night 8/10   Pain Location Hip    Pain Orientation Right    Pain Descriptors / Indicators Aching;Sharp    Pain Type Chronic pain  Pain Onset More than a month ago    Pain Frequency Constant    Aggravating Factors  Sitting, standing, walking, driving, sleeping    Pain Relieving Factors Change of positions so getting up when sitting or resting after a walk, medication, creams    Multiple Pain Sites Yes    Pain Score 5    Pain Location Knee    Pain Orientation Right;Left    Pain Descriptors / Indicators Aching;Sharp    Pain Type Chronic pain    Pain Onset More than a month ago    Pain Frequency Constant    Aggravating Factors  Sitting, standing, walking, driving, sleeping    Pain Relieving Factors Change of positions so getting up when sitting or resting after a walk, medication, creams                [facility] PT Assessment - 09/21/20 0001       Assessment   Medical Diagnosis Right hip pain    Referring Provider (PT) Kinnie Feil, MD    Onset Date/Surgical Date 06/05/20   date of  recent fall   Next MD Visit 10/04/2020    Prior Therapy Yes - knees      Precautions   Precautions Fall      Restrictions   Weight Bearing Restrictions No      Balance Screen   Has the patient fallen in the past 6 months Yes    How many times? 1 fall in April    Has the patient had a decrease in activity level because of a fear of falling?  No    Is the patient reluctant to leave their home because of a fear of falling?  No      Home Environment   Living Environment Private residence    Living Arrangements Children    Type of [place] to enter    Entrance Stairs-Number of Steps 3    Entrance Stairs-Rails Cannot reach both;Right;Left   uses right side   Home Layout One level      Prior Function   Level of Independence Independent      Cognition   Overall Cognitive Status Within Functional Limits for tasks assessed      Observation/Other Assessments   Observations Patient appears in no apparent distress    Focus on Therapeutic Outcomes (FOTO)  NA - MCD      Sensation   Light Touch Appears Intact      Coordination   Gross Motor Movements are Fluid and Coordinated Yes      Functional Tests   Functional tests Single leg stance;Sit to Stand      Single Leg Stance   Comments Unable to perform      Sit to Stand   Comments Patient requires use of BUE for support      Posture/Postural Control   Posture Comments Rounded shoulder and forward head posture      ROM / Strength   AROM / PROM / Strength AROM;PROM;Strength      AROM   Overall AROM Comments Lumbar AROM grossly WFL without reproduction of concordant symptoms    AROM Assessment Site Knee    Right/Left Knee Right;Left    Right Knee Extension 0    Right Knee Flexion 123    Left Knee Extension 0    Left Knee Flexion 125      PROM   Overall PROM Comments Hip PROM grossly [facility]  bilaterally, pain noted at end ranges      Strength   Strength Assessment Site Hip;Knee    Right/Left Hip  Right;Left    Right Hip Flexion 3-/5    Right Hip Extension 3/5    Right Hip ABduction 3/5    Left Hip Flexion 4-/5    Left Hip Extension 3+/5    Left Hip ABduction 3+/5    Right/Left Knee Right;Left    Right Knee Flexion 4-/5    Right Knee Extension 4/5    Left Knee Flexion 4+/5    Left Knee Extension 4+/5      Flexibility   Soft Tissue Assessment /Muscle Length yes    Hamstrings Limited on right with reproduction of hip pain ~45 deg    Quadriceps hip flexor limitation bilaterally      Palpation   Palpation comment TTP grossly around peripatellar region right knee, right lateral hip region      Transfers   Transfers Independent with all Transfers      Ambulation/Gait   Ambulation/Gait Yes    Ambulation/Gait Assistance 7: Independent    Gait Comments Antalgic on right, trendelenburg on right                        Objective measurements completed on examination: See above findings.       [facility] Adult PT Treatment/Exercise - 09/21/20 0001       Exercises   Exercises Knee/Hip      Knee/Hip Exercises: Seated   Long Arc Quad 2 sets;10 reps      Knee/Hip Exercises: Supine   Heel Slides 2 sets;10 reps    Bridges 2 sets;10 reps    Straight Leg Raises 2 sets;10 reps    Other Supine Knee/Hip Exercises Clamshell with yellow 2 x 10    Other Supine Knee/Hip Exercises Marching with yellow 2 x 10                    PT Education - 09/20/20 1647     Education Details Exam findings, POC, HEP    Person(s) Educated Patient    Methods Explanation;Demonstration;Verbal cues;Handout    Comprehension Verbalized understanding;Returned demonstration;Need further instruction;Verbal cues required              PT Short Term Goals - 09/20/20 1648       PT SHORT TERM GOAL #1   Title Patient will be I with initial HEP to progress with PT    Baseline provided at evaluation    Time 3    Period Weeks    Status New    Target Date 10/11/20      PT SHORT  TERM GOAL #2   Title Patient will report </= 5/10 nocturnal pain in order to improve sleeping ability    Baseline currently pain at night 8/10 which impairs sleeping    Time 3    Period Weeks    Status New    Target Date 10/11/20      PT SHORT TERM GOAL #3   Title Patient will be able to walk around grocery store with </= 4/10 pain level to improve grocery shopping    Baseline currently 6/10 walking around grocery store    Time 3    Period Weeks    Status New    Target Date 10/11/20      PT SHORT TERM GOAL #4   Title Patient will be able to demonstrate sit<>stand without  use of UE for support to indicate improved strength and transfer ability    Baseline patient requires BUE for support with sit<>stand    Time 3    Period Weeks    Status New    Target Date 10/11/20               PT Long Term Goals - 09/20/20 1649       PT LONG TERM GOAL #1   Title Patient will be I with final HEP to maintain progress from PT    Baseline provided at evaluation    Time 6    Period Weeks    Status New    Target Date 11/01/20      PT LONG TERM GOAL #2   Title Patient will exhibit = 5/5 MMT knee strength to improve stair negotiation    Baseline 4/5 right quad strength, 4-/5 right hamstring strength    Time 6    Period Weeks    Status New    Target Date 11/01/20      PT LONG TERM GOAL #3   Title Patient will exhibit >/= 4/5 MMT bilateral hip strength to improve gait and reduce pain with walking extended periods    Baseline right hip grossly </= 3/5 MMT, left hip grossly 3+/5 MMT    Time 6    Period Weeks    Status New    Target Date 11/01/20      PT LONG TERM GOAL #4   Title Patient will report ability to perform community level walking  with </= 3/10 pain level to improve community access and shopping    Baseline patient reports pain 6/10 and limited with community level walking    Time 6    Period Weeks    Status New    Target Date 11/01/20                     Plan - 09/21/20 0810     Clinical Impression Statement Patient presents to PT with report of chronic right hip and knee pain that were exacerbated with fall in March 2022. Patient currently exhibits good overall range of motion of knees and hips but with pain at end ranges, flexibility deficit of the hips and hamstrings, strength deficits of the hips and quad, pain with walking and gait deviations, and difficulty with sit>stand transfers and stairs. Patient was provided exercises to initiate light strengthening and would benefit from continued skilled PT to progress her mobility and strength in order to reduce pain and maximize her functional ability to walk and negotiate community level without limitation.    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    Designer, jewellery Low    Rehab Potential Fair    PT Frequency 1x / week    PT Duration 6 weeks    PT Treatment/Interventions ADLs/Self Care Home Management;Aquatic Therapy;Cryotherapy;Electrical Stimulation;Iontophoresis 46m/ml Dexamethasone;Moist Heat;Traction;Ultrasound;Neuromuscular re-education;Balance training;Therapeutic exercise;Therapeutic activities;Functional mobility training;Stair training;Gait training;Patient/family education;Manual techniques;Dry needling;Passive range of motion;Taping;Vasopneumatic Device;Spinal Manipulations;Joint Manipulations    PT Next Visit Plan Review HEP and progress PRN, incorporate light hamstring/hip stretching as tolerated, progress hip and quad strengthening with gradual progression to standing closed chain as tolerated    PT Home Exercise  Plan [id number]    Consulted and Agree with Plan of Care Patient  Patient will benefit from skilled therapeutic intervention in order to improve the following deficits and impairments:  Abnormal gait, Decreased range of motion, Difficulty walking, Decreased activity tolerance, Pain, Decreased balance, Impaired flexibility, Postural dysfunction, Decreased strength  Visit Diagnosis: Pain in right hip  Chronic pain of right knee  Muscle weakness (generalized)  Other abnormalities of gait and mobility     Problem List Patient Active Problem List   Diagnosis Date Noted   Chronic diarrhea 09/16/2020   Nausea and vomiting 09/16/2020   Abdominal mass 09/16/2020   History of ventral hernia 09/16/2020   Abnormal Pap smear of cervix 08/18/2020   Nodule of neck 07/05/2020   Hyperlipidemia associated with type 2 diabetes mellitus ([place]) 06/21/2020   Right hip pain 06/21/2020   Fall (on) (from) other stairs and steps, initial encounter 06/07/2020   Tinea capitis 06/07/2020   Orthopnea 01/04/2020   Chronic cough 12/23/2019   Right sciatic nerve pain 10/05/2019   Right flank pain 09/22/2019   Dyspepsia 05/22/2019   Essential hypertension 04/07/2019   Vaginal bleeding 01/27/2019   Dysuria 01/27/2019   Low back pain 01/27/2019   History of umbilical hernia repair 60/63/0160   Abdominal cramping 09/15/2018   Medication refill 12/18/2017   Unilateral primary osteoarthritis, right knee 12/05/2017   BMI 50.0-59.9, adult ([place]) 10/93/2355   Diastolic dysfunction with heart failure ([place]) 10/25/2017   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   Screen for colon cancer 10/25/2017   Insomnia 10/25/2017   Diabetic nephropathy associated with type 2 diabetes mellitus ([place]) 10/25/2017   Type 2 diabetes mellitus with complication ([place]) 73/22/0254   At risk for acute ischemic cardiac event 08/16/2017   Sleep apnea 08/16/2017   Gastroesophageal reflux disease 08/16/2017   Chronic pain of right knee  07/13/2016   Morbid obesity ([place]) 02/04/2015   Menorrhagia with regular cycle 11/18/2014    Hilda Blades, PT, DPT, LAT, ATC 09/21/20  10:13 AM Phone: [phone] Fax: [facility] [address] [place], [place], [address] Phone: [phone]   Fax:  [phone]  Name: Rita Bass MRN: [id number] Date of Birth: 1965-12-31   PHYSICAL THERAPY DISCHARGE SUMMARY  Visits from Start of Care: 1  Current functional level related to goals / functional outcomes: See above   Remaining deficits: See above   Education / Equipment: HEP   Patient agrees to discharge. Patient goals were not met. Patient is being discharged due to not returning since the last visit.

## 2020-09-21 NOTE — Telephone Encounter (Signed)
Patient calls nurse line reporting she had PT yesterday and today feels pain in her muscles. Patient is requesting a prescription for Flexeril or some other muscle relaxer. Will forward to PCP.

## 2020-09-24 NOTE — Progress Notes (Signed)
Agree with the assessment and plan as outlined by Doug Sou, PA-C. Plan for endoscopic evaluation with EGD/Colo with random and directed bxs, as well as cross sectional imaging and labs.   Yulonda Wheeling, DO, [facility]

## 2020-09-26 ENCOUNTER — Ambulatory Visit (HOSPITAL_COMMUNITY): Payer: Medicaid Other

## 2020-09-29 ENCOUNTER — Ambulatory Visit: Payer: Medicaid Other | Admitting: Physical Therapy

## 2020-10-01 NOTE — Progress Notes (Addendum)
SUBJECTIVE:   CHIEF COMPLAINT / HPI:   Follow-up: Enlarged lymph node on posterior neck: Previously I had referred the patient to surgery as the lymph node had not decreased in size.  The last documented note in the referral from 6/3 states that surgery will reach out to the patient to schedule the appointment.  She states she has not yet heard from the referral for general surgery and is concerned about this.  Abdominal discomfort: States she has had abdominal discomfort for a while.  States she recently went to her GI doctor who has ordered an image and plans to do a colonoscopy and endoscopy over the next month.  She states that she was told she has a hernia present and may need surgery for this.  She denies any pain at the area of the hernia and it does not seem incarcerated at this time.  Chronic right knee pain Has been going on for years. She has been to orthopedic physicians. Has had difficulty sleeping due to the pain. She previously fell off a porch earlier this year which CT did not show a cause.  She also had a history of an x-ray which showed severe osteoarthritis of the right knee.  In the past she has done physical therapy which she is currently still taking part in, she has used Voltaren gel, acetaminophen, NSAIDs, and has had an orthopedic referral in the past.  At her last orthopedic referral she had discussed a knee replacement but states she did lose more weight.  She has been doing well with this and has lost greater than 40 pounds purposefully.  PERTINENT  PMH / PSH: History of chronic right knee pain  OBJECTIVE:   BP (!) 124/105   Pulse 88   Wt 285 lb (129.3 kg)   LMP 04/05/2016   SpO2 96%   BMI 51.30 kg/m    General: NAD, pleasant, able to participate in exam HEENT: Enlarged nodule consistent with lymph node present on posterior aspect of the neck similar to last exam Cardiac: RRR, no murmurs. Respiratory: CTAB, normal effort, No wheezes, rales or  rhonchi Abdomen: Bowel sounds present, nontender, periumbilical hernia present which is not incarcerated Extremities: Right knee with discomfort to the joint line, no discomfort to palpation of the patellar tendon, patella, quadriceps tendon. Skin: warm and dry, no rashes noted Neuro: alert, no obvious focal deficits Psych: Normal affect and mood  ASSESSMENT/PLAN:   Chronic pain of right knee Osteoarthritis of the right knee.  Patient has tried multiple modalities for this without success including Voltaren gel, NSAIDs, acetaminophen, physical therapy which she is currently on, orthopedic referral.  She ultimately needs a knee replacement and is working on losing weight in order to get this.  As she exercises more she is noting more pain and is becoming limited in physical therapy due to discomfort of this knee.  We discussed in detail multiple modalities of osteoarthritis treatment.  I also relayed multiple times my hesitation about pursuing narcotics, however she has been successful in losing greater than 40 pounds and is adamant about continuing to lose weight in order to get her surgery.  I discussed this with Dr. Perley Jain as the preceptor about potentially starting a low-dose of tramadol to use as needed before physical therapy appointments or before exercise.  I discussed this with the patient that I would not be increasing this medication but that we can use this as a mainstay to hold her to her exercise routine in  order to lose weight to ultimately get a knee replacement.  I recommended that she continue with her physical therapy, Voltaren gel, acetaminophen, and using a walker or cane as needed to limit the stress on her knee in addition to this medication.  I have also placed another referral for orthopedic surgery to evaluate what body weight she would need to get to in order to have a knee replacement.  Ventral hernia without obstruction or gangrene no signs of incarceration, has likely  been there for some time.  Discussed with patient I can get her referral to surgery at her convenience but I think it would be more appropriate to wait until after her colonoscopy, endoscopy procedure is completed completed next month.  Patient is on board with this plan   Hypertension-BP elevated today 124/105.  Patient plans make follow-up appointment in the next 1 week for BP recheck as we did not have time to address this today.  She thinks this is elevated because she was rushed today.  Enlarged lymph node of posterior neck: We were able to get in touch with the surgery center which the referral had previously been placed to and have an appointment set up for her for further evaluation.   Jackelyn Poling, DO [facility]

## 2020-10-03 ENCOUNTER — Ambulatory Visit (HOSPITAL_COMMUNITY): Payer: Medicaid Other

## 2020-10-04 ENCOUNTER — Other Ambulatory Visit: Payer: Self-pay

## 2020-10-04 ENCOUNTER — Encounter: Payer: Self-pay | Admitting: Family Medicine

## 2020-10-04 ENCOUNTER — Ambulatory Visit: Payer: Medicaid Other | Admitting: Family Medicine

## 2020-10-04 VITALS — BP 124/105 | HR 88 | Wt 285.0 lb

## 2020-10-04 DIAGNOSIS — G8929 Other chronic pain: Secondary | ICD-10-CM | POA: Diagnosis not present

## 2020-10-04 DIAGNOSIS — E118 Type 2 diabetes mellitus with unspecified complications: Secondary | ICD-10-CM

## 2020-10-04 DIAGNOSIS — K439 Ventral hernia without obstruction or gangrene: Secondary | ICD-10-CM

## 2020-10-04 DIAGNOSIS — M25561 Pain in right knee: Secondary | ICD-10-CM | POA: Diagnosis not present

## 2020-10-04 LAB — POCT GLYCOSYLATED HEMOGLOBIN (HGB A1C): HbA1c, POC (controlled diabetic range): 6.8 % (ref 0.0–7.0)

## 2020-10-04 MED ORDER — TRAMADOL HCL 50 MG PO TABS: 50.0000 mg | ORAL_TABLET | Freq: Two times a day (BID) | ORAL | 0 refills | Status: DC | PRN

## 2020-10-04 NOTE — Assessment & Plan Note (Addendum)
Osteoarthritis of the right knee.  Patient has tried multiple modalities for this without success including Voltaren gel, NSAIDs, acetaminophen, physical therapy which she is currently on, orthopedic referral.  She ultimately needs a knee replacement and is working on losing weight in order to get this.  As she exercises more she is noting more pain and is becoming limited in physical therapy due to discomfort of this knee.  We discussed in detail multiple modalities of osteoarthritis treatment.  I also relayed multiple times my hesitation about pursuing narcotics, however she has been successful in losing greater than 40 pounds and is adamant about continuing to lose weight in order to get her surgery.  I discussed this with Dr. Perley Jain as the preceptor about potentially starting a low-dose of tramadol to use as needed before physical therapy appointments or before exercise.  I discussed this with the patient that I would not be increasing this medication but that we can use this as a mainstay to hold her to her exercise routine in order to lose weight to ultimately get a knee replacement.  I recommended that she continue with her physical therapy, Voltaren gel, acetaminophen, and using a walker or cane as needed to limit the stress on her knee in addition to this medication.  I have also placed another referral for orthopedic surgery to evaluate what body weight she would need to get to in order to have a knee replacement.

## 2020-10-04 NOTE — Assessment & Plan Note (Signed)
no signs of incarceration, has likely been there for some time.  Discussed with patient I can get her referral to surgery at her convenience but I think it would be more appropriate to wait until after her colonoscopy, endoscopy procedure is completed completed next month.  Patient is on board with this plan

## 2020-10-04 NOTE — Patient Instructions (Addendum)
I am starting a medication called tramadol to use before you go to physical therapy.  This medication does have some addiction potential so we need to be cautious with this.  You need to not drink alcohol with this medication.  I recommend that you use it no more than as needed before physical therapy to help you exercise better.  I will not be able to increase the strength of this medication to a stronger medication down the road.  I am hopeful this will help you continue to lose weight in order to get a knee replacement.  You do have a hernia present in your abdomen.  I do not think we have to get a referral to surgery at this moment as it does not seem to be" incarcerated".  I recommend that we wait till we get the results of your imaging and colonoscopy before proceeding.  If you do get any severe abdominal pain I want to go to the emergency department.  For your lymph node I have checked with the referral specialist and they have you an appointment for**

## 2020-10-05 ENCOUNTER — Other Ambulatory Visit: Payer: Self-pay | Admitting: Family Medicine

## 2020-10-05 ENCOUNTER — Telehealth: Payer: Self-pay

## 2020-10-05 MED ORDER — TRAMADOL HCL 50 MG PO TABS: 50.0000 mg | ORAL_TABLET | Freq: Two times a day (BID) | ORAL | 0 refills | Status: DC | PRN

## 2020-10-05 NOTE — Telephone Encounter (Signed)
Patient calls nurse line requesting that tramadol be resent to [facility] on [address].   Current [facility] on [address] closed down, as of today.   Called pharmacy and attempted to verify, however, they had a recording indicating that they were closing down on 7/20.  Please advise.   Veronda Prude, RN

## 2020-10-05 NOTE — Telephone Encounter (Signed)
They cannot move the prescription as it is a controlled substance.  Veronda Prude, RN

## 2020-10-06 ENCOUNTER — Ambulatory Visit: Payer: Medicaid Other | Admitting: Physical Therapy

## 2020-10-11 ENCOUNTER — Ambulatory Visit (HOSPITAL_COMMUNITY)
Admission: RE | Admit: 2020-10-11 | Discharge: 2020-10-11 | Disposition: A | Discharge status: Home / Self Care | Payer: Medicaid Other | Source: Ambulatory Visit | Attending: Gastroenterology | Admitting: Gastroenterology

## 2020-10-11 ENCOUNTER — Other Ambulatory Visit: Payer: Self-pay

## 2020-10-11 ENCOUNTER — Encounter (HOSPITAL_COMMUNITY): Payer: Self-pay

## 2020-10-11 DIAGNOSIS — Z8719 Personal history of other diseases of the digestive system: Secondary | ICD-10-CM | POA: Insufficient documentation

## 2020-10-11 DIAGNOSIS — K529 Noninfective gastroenteritis and colitis, unspecified: Secondary | ICD-10-CM | POA: Diagnosis not present

## 2020-10-11 DIAGNOSIS — R112 Nausea with vomiting, unspecified: Secondary | ICD-10-CM | POA: Diagnosis not present

## 2020-10-11 DIAGNOSIS — R1900 Intra-abdominal and pelvic swelling, mass and lump, unspecified site: Secondary | ICD-10-CM | POA: Diagnosis not present

## 2020-10-11 DIAGNOSIS — K760 Fatty (change of) liver, not elsewhere classified: Secondary | ICD-10-CM | POA: Diagnosis not present

## 2020-10-11 MED ORDER — IOHEXOL 350 MG/ML SOLN
100.0000 mL | Freq: Once | INTRAVENOUS | Status: AC | PRN
  Administered 2020-10-11: 80 mL via INTRAVENOUS

## 2020-10-13 ENCOUNTER — Encounter: Payer: Self-pay | Admitting: Pharmacist

## 2020-10-13 ENCOUNTER — Ambulatory Visit: Payer: Medicaid Other | Admitting: Pharmacist

## 2020-10-13 ENCOUNTER — Ambulatory Visit: Payer: Medicaid Other | Admitting: Physical Therapy

## 2020-10-13 ENCOUNTER — Other Ambulatory Visit: Payer: Self-pay

## 2020-10-13 DIAGNOSIS — E118 Type 2 diabetes mellitus with unspecified complications: Secondary | ICD-10-CM | POA: Diagnosis not present

## 2020-10-13 MED ORDER — KETOCONAZOLE 2 % EX SHAM: 1.0000 "application " | MEDICATED_SHAMPOO | CUTANEOUS | 1 refills | Status: DC

## 2020-10-13 NOTE — Assessment & Plan Note (Signed)
Diabetes longstanding currently controlled with most recent A1c of 6.8 on 10/04/20. Patient is able to verbalize appropriate hypoglycemia management plan. Medication adherence appears appropriate. Patient continues to lose weight and expect better control due in part to this weight loss. Given her significant GI symptoms of unknown cause and since A1c is controlled, will trial off of metformin to see if GI symptoms improve at all and if BG control is maintained. Consider initiating GLP-1 agonist at future visit which will help with weight loss and BG control. She has tried Victoza in the past which she states she tolerated well. This would ideally allow [name] to decrease her Lantus.  -Discontinued metformin  -Continued Lantus 44 units daily and Jardiance 25 mg daily -Extensively discussed pathophysiology of diabetes, recommended lifestyle interventions, dietary effects on blood sugar control -Counseled on s/sx of and management of hypoglycemia -Next A1C anticipated 12/2020.

## 2020-10-13 NOTE — Progress Notes (Signed)
S:     Patient arrives in good spirits.  Presents for diabetes evaluation, education, and management. Patient was referred and last seen by Primary Care Provider, Dr. Atha Starks, on 10/04/20.   Today, patient arrives in good spirits, ambulating independently. Reports she has been doing much better as a result of her weight loss. Weighed 360 lbs in 2017 and is now at 284 lbs. She relates this to her increased exercise and improvements in diet. States that her breathing is much improved and does not require the use of any inhalers. She takes care of her 2 grandchildren and is able to walk without becoming winded. Her goal is to be 230-240 lbs so she can qualify for a knee replacement. Reports that her diabetes has been doing well with BG averaging in the 140s-180s. Today it was 142. The highest she has seen is 178 on one day but denies any values in the 200s. She has been having significant GI symptoms--she is unable to eat much without vomiting/diarrhea and this has gotten worse over the last year. She is scheduled for a colonoscopy. She recently had a CT scan which she would like to hear the results from.   Insurance coverage/medication affordability: Medicaid  Medication adherence reported as daily.   Current diabetes medications include: metformin 1000 mg BID, Jardiance 25 mg daily, Lantus 44 units daily Current hypertension medications include: None Current hyperlipidemia medications include: None  Patient denies hypoglycemic events.  Patient reported dietary habits: Eats smaller meals throughout the day. Has cut out sugar.  Breakfast: boiled egg, toast, grits Dinner: baked chicken, baked potato Snacks: crackers Drinks: has completely cut out sodas (used to drink 20-30 per day), drinking lots of water  Patient-reported exercise habits: increased exercise through exercises in the house and physical therapy   Patient reports nocturia (nighttime urination). 2x per night (stable).  Patient  denies neuropathy (nerve pain). Patient denies visual changes. Patient reports self foot exams.     O:  Physical Exam Constitutional:      Appearance: She is obese.  Pulmonary:     Effort: Pulmonary effort is normal.  Neurological:     Mental Status: She is alert.  Psychiatric:        Mood and Affect: Mood normal.        Behavior: Behavior normal.        Thought Content: Thought content normal.    Review of Systems  Constitutional:  Positive for weight loss.  Gastrointestinal:  Positive for diarrhea, nausea and vomiting.  All other systems reviewed and are negative.  Lab Results  Component Value Date   HGBA1C 6.8 10/04/2020   There were no vitals filed for this visit.  Lipid Panel     Component Value Date/Time   CHOL 192 06/21/2020 1547   TRIG 131 06/21/2020 1547   HDL 54 06/21/2020 1547   CHOLHDL 3.6 06/21/2020 1547   LDLCALC 115 (H) 06/21/2020 1547    Home fasting blood sugars: Averaging in the 140s-180s. Today it was 142. The highest she has seen is 178 on one day but denies any values in the 200s.   Clinical Atherosclerotic Cardiovascular Disease (ASCVD): No  The 10-year ASCVD risk score Denman George DC Jr., et al., 2013) is: 2.6%   Values used to calculate the score:     Age: 55 years     Sex: Female     Is Non-Hispanic African American: No     Diabetic: Yes     Tobacco smoker:  No     Systolic Blood Pressure: 124 mmHg     Is BP treated: No     HDL Cholesterol: 54 mg/dL     Total Cholesterol: 192 mg/dL    A/P: Diabetes longstanding currently controlled with most recent A1c of 6.8 on 10/04/20. Patient is able to verbalize appropriate hypoglycemia management plan. Medication adherence appears appropriate. Patient continues to lose weight and expect better control due in part to this weight loss. Given her significant GI symptoms of unknown cause and since A1c is controlled, will trial off of metformin to see if GI symptoms improve at all and if BG control is  maintained. Consider initiating GLP-1 agonist at future visit which will help with weight loss and BG control. She has tried Victoza in the past which she states she tolerated well. This would ideally allow [name] to decrease her Lantus.  -Discontinued metformin  -Continued Lantus 44 units daily and Jardiance 25 mg daily -Extensively discussed pathophysiology of diabetes, recommended lifestyle interventions, dietary effects on blood sugar control -Counseled on s/sx of and management of hypoglycemia -Next A1C anticipated 12/2020.   Written patient instructions provided.  Total time in face to face counseling 30 minutes.   Follow up Pharmacist Clinic Visit in 6 weeks.   Patient seen with [name], PharmD - PGY2 Pharmacy Resident.

## 2020-10-13 NOTE — Patient Instructions (Addendum)
It was nice to see you today!  Congratulations on your weight loss and improvements in A1c!!!  Your goal blood sugar is 80-130 before eating and less than 180 after eating.  Follow up visit in 6 weeks.   Medication Changes: STOP taking metformin  Continue Lantus 44 units daily and Jardiance 25 mg daily  Monitor blood sugars at home and keep a log (glucometer or piece of paper) to bring with you to your next visit.  Keep up the good work with diet and exercise. Aim for a diet full of vegetables, fruit and lean meats (chicken, [place], fish). Try to limit salt intake by eating fresh or frozen vegetables (instead of canned), rinse canned vegetables prior to cooking and do not add any additional salt to meals.

## 2020-10-13 NOTE — Progress Notes (Signed)
Reviewed: I agree with Dr. Koval's documentation and management. 

## 2020-10-18 ENCOUNTER — Telehealth: Payer: Self-pay | Admitting: Gastroenterology

## 2020-10-18 NOTE — Telephone Encounter (Signed)
Inbound call from patient requesting a call with CT scan results please.

## 2020-10-18 NOTE — Telephone Encounter (Signed)
Shanda Bumps have you seen CT results? The pt is calling.  Thanks

## 2020-10-20 ENCOUNTER — Other Ambulatory Visit: Payer: Self-pay | Admitting: Family Medicine

## 2020-10-24 ENCOUNTER — Other Ambulatory Visit: Payer: Self-pay

## 2020-10-24 ENCOUNTER — Encounter (HOSPITAL_COMMUNITY): Payer: Self-pay | Admitting: Gastroenterology

## 2020-10-25 NOTE — Patient Instructions (Signed)
It was great to see you! Thank you for allowing me to participate in your care!  I recommend that you always bring your medications to each appointment as this makes it easy to ensure we are on the correct medications and helps [name] not miss when refills are needed.  Our plans for today:  -Today we discussed the results of your CT scan.  We do not have to do anything with the results of the CT scan at this time.  We can consider doing a follow-up repeat imaging in 1 to 2 years to evaluate the lung nodule but it does not seem to be changing at this time. -I would like you to stop the ketoconazole shampoo and start using triamcinolone which I have sent to your pharmacy.  Use this for at least the next 20 to 30 days.  I would like for you to follow back up in about 3 weeks to see how it is improving.   Take care and seek immediate care sooner if you develop any concerns.   Dr. Jackelyn Poling, DO [facility]

## 2020-10-25 NOTE — Progress Notes (Signed)
    SUBJECTIVE:   CHIEF COMPLAINT / HPI:   Encounter to discuss CT results: Patient follows up to get more clarification about the results from GI ordered abdominal CT to evaluate hernia. CT showed: "1. Complex ventral hernia with marked laxity of the anterior abdominal wall. Dominant hernia is just to the right of midline and contains fat and a short segment of the proximal transverse colon. No evidence for incarceration or bowel obstruction. 2. A second smaller ventral hernia is identified immediately medial to the dominant hernia and contains only fat. 3. Hepatomegaly with hepatic steatosis. 4. 2 mm nonobstructing stone lower pole left kidney. 5. Stable 4 mm left lower lobe pulmonary nodule consistent with benign etiology." I discussed these results in detail with the patient as well as their implications and answered her questions.  Scalp flaking Itches in small area on posterior scalp. Has been using ketoconazole shampoo 2x per week which is not helping it.    PERTINENT  PMH / PSH: History of abdominal hernia  OBJECTIVE:   BP 128/80   Pulse 78   Ht 5\' 3"  (1.6 m)   Wt 286 lb 2 oz (129.8 kg)   LMP 04/05/2016   SpO2 99%   BMI 50.68 kg/m    General: NAD, pleasant, able to participate in exam Cardiac: RRR, no murmurs. Respiratory: CTAB, normal effort, No wheezes, rales or rhonchi Extremities: no edema or cyanosis. Skin: Area with skin flaking and some mild erythema which is approximately 3 cm in an oblong fashion on the posterior scalp just below patient's ponytail. Neuro: alert, no obvious focal deficits Psych: Normal affect and mood  ASSESSMENT/PLAN:   Scalp rash Initial differential was between fungal versus seborrhea.  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  Patient tried ketoconazole shampoo but did not get any benefit.  We will attempt steroid with  triamcinolone twice daily for the next 20 to 30 days.  Recommend patient follow-up in the next 3 weeks to evaluate if it is getting better.  Encounter to discuss imaging: Discussed patient's previous CT of the abdomen which were performed by GI to evaluate a hernia.  It showed a nonincarcerated hernia.  I discussed with the patient that we do not have to do a surgery for this and that the patient would not be an ideal surgical candidate and she is in agreement with this.  And also showed a nonobstructing 2 mm renal stone which we do not need to do anything about at this time.  It also showed a stable 4 mm left lower lung nodule.  The patient is interested in doing a follow-up for this in the next 1 to 2 years to evaluate if it is changing.  04/07/2016, DO [facility]

## 2020-10-26 ENCOUNTER — Other Ambulatory Visit: Payer: Self-pay

## 2020-10-26 ENCOUNTER — Ambulatory Visit (INDEPENDENT_AMBULATORY_CARE_PROVIDER_SITE_OTHER): Payer: Medicaid Other | Admitting: Family Medicine

## 2020-10-26 ENCOUNTER — Encounter: Payer: Self-pay | Admitting: Family Medicine

## 2020-10-26 DIAGNOSIS — R911 Solitary pulmonary nodule: Secondary | ICD-10-CM | POA: Diagnosis not present

## 2020-10-26 MED ORDER — TRIAMCINOLONE ACETONIDE 0.1 % EX CREA: 1.0000 "application " | TOPICAL_CREAM | Freq: Two times a day (BID) | CUTANEOUS | 0 refills | Status: DC

## 2020-10-31 ENCOUNTER — Telehealth: Payer: Self-pay | Admitting: Gastroenterology

## 2020-10-31 ENCOUNTER — Ambulatory Visit: Payer: Medicaid Other | Admitting: Orthopedic Surgery

## 2020-10-31 NOTE — Telephone Encounter (Signed)
Patient called after hours tonight - she started Miralax / Gatorade prep after only taking 10 oz, but vomited it quickly and upsetting her stomach. She does not think she can proceed with bowel prep tonight. I told her to take a few hours to  let her stomach settle and try again, slowly, if she tolerates it. I offered to call her in some Zofran but she states she can't go to the pharmacy tonight. She will consider trying bowel prep later tonight for EGD / colonoscopy tomorrow, however if she just can't get the prep down she will still come in for the upper endoscopy with Dr. Barron Alvine at the hospital.   Gae Bon, [place], she may only be able to do upper endoscopy tomorrow, pending her course tonight.

## 2020-11-01 ENCOUNTER — Encounter (HOSPITAL_COMMUNITY): Payer: Self-pay | Admitting: Gastroenterology

## 2020-11-01 ENCOUNTER — Ambulatory Visit (HOSPITAL_COMMUNITY): Payer: Medicaid Other | Admitting: Certified Registered Nurse Anesthetist

## 2020-11-01 ENCOUNTER — Other Ambulatory Visit: Payer: Self-pay

## 2020-11-01 ENCOUNTER — Ambulatory Visit (HOSPITAL_COMMUNITY)
Admission: RE | Admit: 2020-11-01 | Discharge: 2020-11-01 | Disposition: A | Discharge status: Home / Self Care | Payer: Medicaid Other | Attending: Gastroenterology | Admitting: Gastroenterology

## 2020-11-01 ENCOUNTER — Encounter (HOSPITAL_COMMUNITY)
Admission: RE | Disposition: A | Discharge status: Home / Self Care | Payer: Self-pay | Source: Home / Self Care | Attending: Gastroenterology

## 2020-11-01 ENCOUNTER — Telehealth: Payer: Self-pay

## 2020-11-01 DIAGNOSIS — R1084 Generalized abdominal pain: Secondary | ICD-10-CM | POA: Insufficient documentation

## 2020-11-01 DIAGNOSIS — K529 Noninfective gastroenteritis and colitis, unspecified: Secondary | ICD-10-CM

## 2020-11-01 DIAGNOSIS — Z8719 Personal history of other diseases of the digestive system: Secondary | ICD-10-CM

## 2020-11-01 DIAGNOSIS — E669 Obesity, unspecified: Secondary | ICD-10-CM | POA: Diagnosis not present

## 2020-11-01 DIAGNOSIS — R140 Abdominal distension (gaseous): Secondary | ICD-10-CM

## 2020-11-01 DIAGNOSIS — R1900 Intra-abdominal and pelvic swelling, mass and lump, unspecified site: Secondary | ICD-10-CM

## 2020-11-01 DIAGNOSIS — R112 Nausea with vomiting, unspecified: Secondary | ICD-10-CM | POA: Diagnosis not present

## 2020-11-01 DIAGNOSIS — K2950 Unspecified chronic gastritis without bleeding: Secondary | ICD-10-CM | POA: Diagnosis not present

## 2020-11-01 DIAGNOSIS — K2100 Gastro-esophageal reflux disease with esophagitis, without bleeding: Secondary | ICD-10-CM

## 2020-11-01 DIAGNOSIS — K439 Ventral hernia without obstruction or gangrene: Secondary | ICD-10-CM | POA: Insufficient documentation

## 2020-11-01 DIAGNOSIS — K219 Gastro-esophageal reflux disease without esophagitis: Secondary | ICD-10-CM | POA: Diagnosis not present

## 2020-11-01 DIAGNOSIS — Z6841 Body Mass Index (BMI) 40.0 and over, adult: Secondary | ICD-10-CM | POA: Insufficient documentation

## 2020-11-01 DIAGNOSIS — K760 Fatty (change of) liver, not elsewhere classified: Secondary | ICD-10-CM | POA: Insufficient documentation

## 2020-11-01 DIAGNOSIS — I10 Essential (primary) hypertension: Secondary | ICD-10-CM | POA: Diagnosis not present

## 2020-11-01 DIAGNOSIS — K2970 Gastritis, unspecified, without bleeding: Secondary | ICD-10-CM

## 2020-11-01 HISTORY — PX: ESOPHAGOGASTRODUODENOSCOPY (EGD) WITH PROPOFOL: SHX5813

## 2020-11-01 HISTORY — PX: BIOPSY: SHX5522

## 2020-11-01 LAB — GLUCOSE, CAPILLARY: Glucose-Capillary: 149 mg/dL — ABNORMAL HIGH (ref 70–99)

## 2020-11-01 SURGERY — ESOPHAGOGASTRODUODENOSCOPY (EGD) WITH PROPOFOL
Anesthesia: Monitor Anesthesia Care

## 2020-11-01 MED ORDER — CLENPIQ 10-3.5-12 MG-GM -GM/160ML PO SOLN: 1.0000 | ORAL | 0 refills | Status: DC

## 2020-11-01 MED ORDER — SODIUM CHLORIDE 0.9 % IV SOLN: INTRAVENOUS | Status: DC

## 2020-11-01 MED ORDER — LACTATED RINGERS IV SOLN: INTRAVENOUS | Status: DC

## 2020-11-01 MED ORDER — LIDOCAINE 2% (20 MG/ML) 5 ML SYRINGE
INTRAMUSCULAR | Status: DC | PRN
  Administered 2020-11-01: 100 mg via INTRAVENOUS

## 2020-11-01 MED ORDER — PROPOFOL 500 MG/50ML IV EMUL
INTRAVENOUS | Status: DC | PRN
  Administered 2020-11-01: 100 ug/kg/min via INTRAVENOUS

## 2020-11-01 MED ORDER — PROPOFOL 10 MG/ML IV BOLUS
INTRAVENOUS | Status: DC | PRN
  Administered 2020-11-01: 40 mg via INTRAVENOUS
  Administered 2020-11-01: 30 mg via INTRAVENOUS

## 2020-11-01 MED ORDER — OMEPRAZOLE MAGNESIUM 20 MG PO TBEC: 20.0000 mg | DELAYED_RELEASE_TABLET | Freq: Two times a day (BID) | ORAL | 2 refills | Status: DC

## 2020-11-01 MED ORDER — PROPOFOL 1000 MG/100ML IV EMUL
INTRAVENOUS | Status: AC
  Filled 2020-11-01: qty 100

## 2020-11-01 MED ORDER — ONDANSETRON 4 MG PO TBDP
ORAL_TABLET | ORAL | 0 refills | Status: DC
Start: 2020-11-01 — End: 2021-05-09

## 2020-11-01 SURGICAL SUPPLY — 24 items

## 2020-11-01 NOTE — Telephone Encounter (Signed)
-----   Message from [facility], DO sent at 11/01/2020  9:54 AM EDT ----- Patient was unable to tolerate bowel prep last night.  Will still plan for EGD at [facility] today.  Please plan for the following:  - Reschedule colonoscopy at [facility] (obesity) - Prescribe Zofran 4 mg.  Take 1 tab 30-60 minutes prior to starting bowel prep and additional 4 mg every 4 hours as needed for nausea/vomiting during prep - Prescribed alternate bowel preparation such as Clenpiq - Also needs General Surgery referral for large ventral hernia containing portion of the transverse colon  Thanks!

## 2020-11-01 NOTE — Anesthesia Preprocedure Evaluation (Addendum)
Anesthesia Evaluation  Patient identified by MRN, date of birth, ID band Patient awake    Reviewed: Allergy & Precautions, H&P , NPO status , Patient's Chart, lab work & pertinent test results  Airway Mallampati: III  TM Distance: >3 FB Neck ROM: Full    Dental no notable dental hx. (+) Teeth Intact, Dental Advisory Given   Pulmonary shortness of breath, asthma , sleep apnea ,    Pulmonary exam normal breath sounds clear to auscultation       Cardiovascular hypertension,  Rhythm:Regular Rate:Normal     Neuro/Psych negative neurological ROS  negative psych ROS   GI/Hepatic Neg liver ROS, GERD  ,  Endo/Other  diabetes, Type 2, Oral Hypoglycemic AgentsMorbid obesity  Renal/GU negative Renal ROS  negative genitourinary   Musculoskeletal  (+) Arthritis , Osteoarthritis,    Abdominal   Peds  Hematology negative hematology ROS (+)   Anesthesia Other Findings   Reproductive/Obstetrics negative OB ROS                            Anesthesia Physical Anesthesia Plan  ASA: 3  Anesthesia Plan: MAC   Post-op Pain Management:    Induction: Intravenous  PONV Risk Score and Plan: 3 and Propofol infusion and Treatment may vary due to age or medical condition  Airway Management Planned: Simple Face Mask  Additional Equipment:   Intra-op Plan:   Post-operative Plan:   Informed Consent: I have reviewed the patients History and Physical, chart, labs and discussed the procedure including the risks, benefits and alternatives for the proposed anesthesia with the patient or authorized representative who has indicated his/her understanding and acceptance.     Dental advisory given  Plan Discussed with: CRNA  Anesthesia Plan Comments:        Anesthesia Quick Evaluation

## 2020-11-01 NOTE — H&P (Signed)
Chief Complaint: Colon cancer screening, chronic diarrhea, generalized abdominal pain, bloating, nausea/vomiting    HPI:     Rita Bass is a 55 y.o. female presenting to [facility] for EGD/colonoscopy for evaluation of multiple GI symptoms, to include chronic diarrhea, abdominal bloating, generalized abdominal pain, nausea/vomiting, along with routine colon cancer screening.  No previous EGD/colonoscopy.  She was evaluated by Doug Sou in the GI clinic on 09/16/2020 for these symptoms and scheduled for EGD/colonoscopy.  No significant changes to medical/surgical history or medications since that appointment.  CT abdomen/pelvis on 10/11/2020: Complex ventral hernia with marked laxity of anterior abdominal wall.  Dominant hernia just to the right of midline and contains fat and short segment of proximal transverse colon without incarceration or obstruction.  A second smaller ventral hernia identified immediately medial to the dominant hernia and contains only fat.  Hepatomegaly with hepatic steatosis.  Benign pulmonary nodule.  Region scheduled for EGD/colonoscopy as above.  Was unable to tolerate bowel prep last night.  Plan for EGD only today and will reschedule colonoscopy portion.  Past Medical History:  Diagnosis Date   Arthritis    Asthma    Chronic pain of left knee    Complication of anesthesia    Diabetes (HCC)    GERD (gastroesophageal reflux disease)    H/O bronchitis    Kidney stones    Obesity    Renal disorder    Shortness of breath dyspnea    Sleep apnea in adult    Polysomnogram pending.  Followed by Dr. Craige Cotta     Past Surgical History:  Procedure Laterality Date   CESAREAN SECTION     6 c-sections   CHOLECYSTECTOMY     HERNIA REPAIR     KIDNEY STONE SURGERY     OOPHORECTOMY     TRANSTHORACIC ECHOCARDIOGRAM  09/2017   Technically difficult study.  Did not use Definity contrast.  EF was 55 to 60% with moderate LVH and grade 1  diastolic dysfunction.  No significant valvular lesions noted.  No regional wall motion normality but difficult to assess due to poor imaging.    TUBAL LIGATION     Family History  Problem Relation Age of Onset   Breast cancer Mother    Other Father        committed suicide   Diabetes Father    Heart attack Maternal Aunt    Colon cancer Neg Hx    Esophageal cancer Neg Hx    Pancreatic cancer Neg Hx    Stomach cancer Neg Hx    Liver disease Neg Hx    Social History   Tobacco Use   Smoking status: Never   Smokeless tobacco: Never  Vaping Use   Vaping Use: Never used  Substance Use Topics   Alcohol use: No   Drug use: No   No current facility-administered medications for this encounter.   No Known Allergies   Review of Systems: All systems reviewed and negative except where noted in HPI.     Physical Exam:    Wt Readings from Last 3 Encounters:  10/24/20 129.3 kg  10/26/20 129.8 kg  10/13/20 129.2 kg    Ht 5\' 3"  (1.6 m)   Wt 129.3 kg   LMP 04/05/2016   BMI 50.49 kg/m  Constitutional:  Pleasant, in no acute distress. Cardiovascular: Normal rate, regular rhythm. No edema Pulmonary/chest: Effort normal and breath sounds normal. No wheezing, rales or rhonchi. Abdominal: Soft, nondistended, nontender. Bowel sounds  active throughout. There are no masses palpable. No hepatomegaly. Neurological: Alert and oriented to person place and time.  ASSESSMENT AND PLAN;   1) Colon cancer screening - Unable to tolerate bowel prep last night - Reschedule colonoscopy at [facility] [address] at future date - Plan to prescribe Zofran to take prior to starting bowel prep and additional dosing prn nausea/vomiting with prep  2) Generalized abdominal pain 3) Abdominal bloating 4) Nausea/vomiting - EGD today with gastric and duodenal biopsies   5) Large ventral hernia - General Surgery referral    Shellia Cleverly, DO, FACG  11/01/2020, 8:36 AM   No ref. provider found

## 2020-11-01 NOTE — Transfer of Care (Signed)
Immediate Anesthesia Transfer of Care Note  Patient: Rita Bass  Procedure(s) Performed: ESOPHAGOGASTRODUODENOSCOPY (EGD) WITH PROPOFOL BIOPSY  Patient Location: Endoscopy Unit  Anesthesia Type:MAC  Level of Consciousness: drowsy and patient cooperative  Airway & Oxygen Therapy: Patient Spontanous Breathing and Patient connected to face mask oxygen  Post-op Assessment: Report given to RN and Post -op Vital signs reviewed and stable  Post vital signs: Reviewed and stable  Last Vitals:  Vitals Value Taken Time  BP 160/81 11/01/20 1024  Temp    Pulse 76 11/01/20 1025  Resp 19 11/01/20 1025  SpO2 100 % 11/01/20 1025  Vitals shown include unvalidated device data.  Last Pain:  Vitals:   11/01/20 1024  TempSrc:   PainSc: 0-No pain         Complications: No notable events documented.

## 2020-11-01 NOTE — Discharge Instructions (Signed)
YOU HAD AN ENDOSCOPIC PROCEDURE TODAY: Refer to the procedure report and other information in the discharge instructions given to you for any specific questions about what was found during the examination. If this information does not answer your questions, please call [place] office at [phone] to clarify.   YOU SHOULD EXPECT: Some feelings of bloating in the abdomen. Passage of more gas than usual. Walking can help get rid of the air that was put into your GI tract during the procedure and reduce the bloating. If you had a lower endoscopy (such as a colonoscopy or flexible sigmoidoscopy) you may notice spotting of blood in your stool or on the toilet paper. Some abdominal soreness may be present for a day or two, also.  DIET: Your first meal following the procedure should be a light meal and then it is ok to progress to your normal diet. A half-sandwich or bowl of soup is an example of a good first meal. Heavy or fried foods are harder to digest and may make you feel nauseous or bloated. Drink plenty of fluids but you should avoid alcoholic beverages for 24 hours. If you had a esophageal dilation, please see attached instructions for diet.    ACTIVITY: Your care partner should take you home directly after the procedure. You should plan to take it easy, moving slowly for the rest of the day. You can resume normal activity the day after the procedure however YOU SHOULD NOT DRIVE, use power tools, machinery or perform tasks that involve climbing or major physical exertion for 24 hours (because of the sedation medicines used during the test).   SYMPTOMS TO REPORT IMMEDIATELY: A gastroenterologist can be reached at any hour. Please call [phone]  for any of the following symptoms:   Following upper endoscopy (EGD, EUS, ERCP, esophageal dilation) Vomiting of blood or coffee ground material  New, significant abdominal pain  New, significant chest pain or pain under the shoulder blades  Painful or  persistently difficult swallowing  New shortness of breath  Black, tarry-looking or red, bloody stools  FOLLOW UP:  If any biopsies were taken you will be contacted by phone or by letter within the next 1-3 weeks. Call [phone]  if you have not heard about the biopsies in 3 weeks.  Please also call with any specific questions about appointments or follow up tests.  

## 2020-11-01 NOTE — Op Note (Addendum)
[facility] Patient Name: Rita Bass Procedure Date: 11/01/2020 MRN: [id number] Attending MD: Doristine Locks , MD Date of Birth: 1965-06-15 CSN: [id number] Age: 55 Admit Type: Outpatient Procedure:                Upper GI endoscopy Indications:              Generalized abdominal pain, Abdominal bloating,                            Diarrhea, Nausea with vomiting Providers:                Doristine Locks, MD, Audley Hose, Rozetta Nunnery, Technician Referring MD:              Medicines:                Monitored Anesthesia Care Complications:            No immediate complications. Estimated Blood Loss:     Estimated blood loss was minimal. Procedure:                Pre-Anesthesia Assessment:                           - Prior to the procedure, a History and Physical                            was performed, and patient medications and                            allergies were reviewed. The patient's tolerance of                            previous anesthesia was also reviewed. The risks                            and benefits of the procedure and the sedation                            options and risks were discussed with the patient.                            All questions were answered, and informed consent                            was obtained. Prior Anticoagulants: The patient has                            taken no previous anticoagulant or antiplatelet                            agents. ASA Grade Assessment: III - A patient with                            severe  systemic disease. After reviewing the risks                            and benefits, the patient was deemed in                            satisfactory condition to undergo the procedure.                           After obtaining informed consent, the endoscope was                            passed under direct vision. Throughout the                            procedure,  the patient's blood pressure, pulse, and                            oxygen saturations were monitored continuously. The                            [id number]-H190 ([id number] Olympus endoscope was introduced                            through the mouth, and advanced to the second part                            of duodenum. The upper GI endoscopy was                            accomplished without difficulty. The patient                            tolerated the procedure well. Scope In: Scope Out: Findings:      LA Grade A (one or more mucosal breaks less than 5 mm, not extending       between tops of 2 mucosal folds) esophagitis with no bleeding was found       at the gastroesophageal junction.      The remainder of the esophagus was normal.      Diffuse mild inflammation characterized by congestion (edema) and       erythema was found in the gastric fundus, in the gastric body and in the       gastric antrum. Biopsies were taken with a cold forceps for Helicobacter       pylori testing. Estimated blood loss was minimal.      The examined duodenum was normal. Biopsies were taken with a cold       forceps for histology. Estimated blood loss was minimal. Impression:               - LA Grade A reflux esophagitis with no bleeding.                           - Normal esophagus.                           -  Gastritis. Biopsied.                           - Normal examined duodenum. Biopsied. Moderate Sedation:      Not Applicable - Patient had care per Anesthesia. Recommendation:           - Patient has a contact number available for                            emergencies. The signs and symptoms of potential                            delayed complications were discussed with the                            patient. Return to normal activities tomorrow.                            Written discharge instructions were provided to the                            patient.                           - Resume  previous diet.                           - Continue present medications.                           - Await pathology results.                           - Use Prilosec (omeprazole) 20 mg PO BID for 4                            weeks to promote mucosal healing, then reduce to                            the lowest effective dose to control upper GI and                            reflux symptoms.                           - Return to GI clinic PRN.                           - Perform a colonoscopy at appointment to be                            scheduled. Was scheduled for colonoscopy today, but                            could not tolerate bowel prep due to  nausea/vomiting. Will reschedule with Rx for                            anti-emetics and alternate bowel prep regimen. Procedure Code(s):        --- Professional ---                           [id number], Esophagogastroduodenoscopy, flexible,                            transoral; with biopsy, single or multiple Diagnosis Code(s):        --- Professional ---                           K21.00, Gastro-esophageal reflux disease with                            esophagitis, without bleeding                           K29.70, Gastritis, unspecified, without bleeding                           R10.84, Generalized abdominal pain                           R14.0, Abdominal distension (gaseous)                           R19.7, Diarrhea, unspecified                           R11.2, Nausea with vomiting, unspecified CPT copyright 2019 American Medical Association. All rights reserved. The codes documented in this report are preliminary and upon coder review may  be revised to meet current compliance requirements. Doristine LocksVito Keyondre Hepburn, MD 11/01/2020 10:21:57 AM Number of Addenda: 0

## 2020-11-01 NOTE — Telephone Encounter (Addendum)
Patient has been scheduled for her colonoscopy at [facility] with Dr. Barron Alvine on Thursday, 12/15/20 at 11:45 am. Patient will need to arrive at 10:15 am with a care partner. New instructions will be mailed to patient, she confirmed address on file. I have reviewed all information with patient and she verbalized understanding of all information.  RX for Zofran and Clenpiq sent to pharmacy on file.   Referral, records, demographic and insurance information faxed to [facility].  Ambulatory referral to GI in epic.  CASE ID: [id number]

## 2020-11-01 NOTE — Anesthesia Postprocedure Evaluation (Signed)
Anesthesia Post Note  Patient: Rita Bass  Procedure(s) Performed: ESOPHAGOGASTRODUODENOSCOPY (EGD) WITH PROPOFOL BIOPSY     Patient location during evaluation: Endoscopy Anesthesia Type: MAC Level of consciousness: awake and alert Pain management: pain level controlled Vital Signs Assessment: post-procedure vital signs reviewed and stable Respiratory status: spontaneous breathing, nonlabored ventilation and respiratory function stable Cardiovascular status: stable and blood pressure returned to baseline Postop Assessment: no apparent nausea or vomiting Anesthetic complications: no   No notable events documented.  Last Vitals:  Vitals:   11/01/20 1030 11/01/20 1040  BP: (!) 153/90 (!) 148/96  Pulse: 81 72  Resp: 18 16  Temp:    SpO2: 96% 97%    Last Pain:  Vitals:   11/01/20 1040  TempSrc:   PainSc: 0-No pain                 Mitsy Owen,W. EDMOND

## 2020-11-02 ENCOUNTER — Encounter (HOSPITAL_COMMUNITY): Payer: Self-pay | Admitting: Gastroenterology

## 2020-11-03 LAB — SURGICAL PATHOLOGY

## 2020-11-07 ENCOUNTER — Telehealth: Payer: Self-pay

## 2020-11-07 DIAGNOSIS — B9681 Helicobacter pylori [H. pylori] as the cause of diseases classified elsewhere: Secondary | ICD-10-CM

## 2020-11-07 MED ORDER — METRONIDAZOLE 250 MG PO TABS: 250.0000 mg | ORAL_TABLET | Freq: Four times a day (QID) | ORAL | 0 refills | Status: DC

## 2020-11-07 MED ORDER — DOXYCYCLINE HYCLATE 100 MG PO TABS: 100.0000 mg | ORAL_TABLET | Freq: Two times a day (BID) | ORAL | 0 refills | Status: DC

## 2020-11-07 MED ORDER — BISMUTH SUBSALICYLATE 262 MG PO CHEW: 524.0000 mg | CHEWABLE_TABLET | Freq: Four times a day (QID) | ORAL | 0 refills | Status: DC

## 2020-11-07 NOTE — Telephone Encounter (Signed)
Medication sent in and LVM for patient to call back. Stool kit she can pick up at [facility] location

## 2020-11-07 NOTE — Progress Notes (Signed)
    SUBJECTIVE:   CHIEF COMPLAINT / HPI:   Scalp rash: At her last appointment we started a steroid with triamcinolone twice daily for the next 3 to 4 weeks.  Patient states that she has been using this as directed.  The differential was between fungal versus seborrhea.  Patient states it is improving with the current medication.  Positive H. pylori:  Patient with recent biopsy-proven H. pylori.  She has not yet started medication for this. This has been sent in by her GI doctor.  PERTINENT  PMH / PSH: Hx of Nausea  OBJECTIVE:   BP 106/68   Pulse 82   Ht 5\' 3"  (1.6 m)   Wt 284 lb 12.8 oz (129.2 kg)   LMP 04/05/2016   SpO2 97%   BMI 50.45 kg/m    General: NAD, pleasant, able to participate in exam HEENT:1-2cm mildly erythematous circular lesion without raised edges located above the hairline on the posterior scalp.   Cardiac: RRR, no murmurs. Respiratory: CTAB, normal effort, No wheezes, rales or rhonchi Abdomen: Mostly nontender with mild discomfort in the epigastric region with palpation Neuro: alert, no obvious focal deficits Psych: Normal affect and mood  ASSESSMENT/PLAN:   H. pylori infection Positive biopsy proven H. pylori.  Her GI doctor has sent in quadruple therapy.  She request that this be sent to different pharmacy which I have done.  Tinea capitis Improving.  Continue current medications.  Follow-up in 4 weeks.    04/07/2016, DO [facility]

## 2020-11-07 NOTE — Telephone Encounter (Signed)
-----   Message from [place] V, DO sent at 11/07/2020  3:26 PM EDT ----- The biopsies from the recent upper endoscopy notable for the following: -The biopsies taken from your small intestine were normal and there was no evidence of Celiac Disease.  -The biopsies from the recent upper GI Endoscopy were notable for H. Pylori gastritis, and will plan on treating with quad therapy as below. Please confirm no medication allergies to the prescribed regimen.   1) Omeprazole 20 mg 2 times a day (this was prescribed at the time of upper endoscopy) 2) Pepto Bismol 2 tabs (262 mg each) 4 times a day x 14 d 3) Metronidazole 250 mg 4 times a day x 14 d 4) doxycycline 100 mg 2 times a day x 14 d  After 14 days, ok to stop omeprazole.  4 weeks after treatment completed, check H. Pylori stool antigen to confirm eradication (must be off acid suppression therapy)  Dx: H. Pylori gastritis

## 2020-11-08 ENCOUNTER — Encounter: Payer: Self-pay | Admitting: Family Medicine

## 2020-11-08 ENCOUNTER — Ambulatory Visit (INDEPENDENT_AMBULATORY_CARE_PROVIDER_SITE_OTHER): Payer: Medicaid Other | Admitting: Family Medicine

## 2020-11-08 ENCOUNTER — Other Ambulatory Visit: Payer: Self-pay

## 2020-11-08 DIAGNOSIS — A048 Other specified bacterial intestinal infections: Secondary | ICD-10-CM | POA: Insufficient documentation

## 2020-11-08 DIAGNOSIS — B350 Tinea barbae and tinea capitis: Secondary | ICD-10-CM | POA: Diagnosis not present

## 2020-11-08 MED ORDER — DOXYCYCLINE HYCLATE 100 MG PO TABS: 100.0000 mg | ORAL_TABLET | Freq: Two times a day (BID) | ORAL | 0 refills | Status: AC

## 2020-11-08 MED ORDER — BISMUTH SUBSALICYLATE 262 MG PO CHEW: 524.0000 mg | CHEWABLE_TABLET | Freq: Four times a day (QID) | ORAL | 0 refills | Status: AC

## 2020-11-08 MED ORDER — METRONIDAZOLE 250 MG PO TABS: 250.0000 mg | ORAL_TABLET | Freq: Four times a day (QID) | ORAL | 0 refills | Status: AC

## 2020-11-08 NOTE — Assessment & Plan Note (Signed)
Improving.  Continue current medications.  Follow-up in 4 weeks.

## 2020-11-08 NOTE — Telephone Encounter (Signed)
LVM for patient to call back. ?

## 2020-11-08 NOTE — Assessment & Plan Note (Signed)
Positive biopsy proven H. pylori.  Her GI doctor has sent in quadruple therapy.  She request that this be sent to different pharmacy which I have done.

## 2020-11-08 NOTE — Patient Instructions (Signed)
Your scalp seems to be improving.  Continue current medications.  I would like to see back in about a month to see how it is doing.  The biopsy that the GI doctors took showed H. pylori.  This is a bacteria that can cause stomach ulcers.  They have sent in a series of 3 prescriptions to take alongside your omeprazole.  I have sent these into your correct pharmacy for you.  Please pick these up and start this.  I will see you back in 1 month or sooner if needed.

## 2020-11-09 DIAGNOSIS — K439 Ventral hernia without obstruction or gangrene: Secondary | ICD-10-CM | POA: Diagnosis not present

## 2020-11-09 DIAGNOSIS — R221 Localized swelling, mass and lump, neck: Secondary | ICD-10-CM | POA: Diagnosis not present

## 2020-11-10 NOTE — Telephone Encounter (Signed)
Phone rang busy. Letter sent. Noted that she was told about this by her PCP

## 2020-11-15 ENCOUNTER — Ambulatory Visit: Payer: Medicaid Other | Admitting: Pharmacist

## 2020-11-17 NOTE — Telephone Encounter (Signed)
Spoke to patient and she know to finish ALL medication and she has to be off her PPI 2 weeks prior to doing stool specimen that she will get from the [place] lab

## 2020-11-17 NOTE — Telephone Encounter (Signed)
Patient returning your call.

## 2020-11-22 ENCOUNTER — Other Ambulatory Visit: Payer: Self-pay

## 2020-11-22 MED ORDER — TRAMADOL HCL 50 MG PO TABS: 50.0000 mg | ORAL_TABLET | Freq: Two times a day (BID) | ORAL | 0 refills | Status: DC | PRN

## 2020-11-24 ENCOUNTER — Encounter: Payer: Self-pay | Admitting: Pharmacist

## 2020-11-24 ENCOUNTER — Other Ambulatory Visit: Payer: Self-pay | Admitting: Surgery

## 2020-11-24 ENCOUNTER — Other Ambulatory Visit (HOSPITAL_COMMUNITY): Payer: Self-pay | Admitting: Surgery

## 2020-11-24 ENCOUNTER — Other Ambulatory Visit: Payer: Self-pay

## 2020-11-24 ENCOUNTER — Ambulatory Visit (INDEPENDENT_AMBULATORY_CARE_PROVIDER_SITE_OTHER): Payer: Medicaid Other | Admitting: Pharmacist

## 2020-11-24 VITALS — BP 134/90 | HR 78 | Wt 284.8 lb

## 2020-11-24 DIAGNOSIS — R221 Localized swelling, mass and lump, neck: Secondary | ICD-10-CM

## 2020-11-24 DIAGNOSIS — E1121 Type 2 diabetes mellitus with diabetic nephropathy: Secondary | ICD-10-CM

## 2020-11-24 DIAGNOSIS — R0600 Dyspnea, unspecified: Secondary | ICD-10-CM

## 2020-11-24 DIAGNOSIS — E118 Type 2 diabetes mellitus with unspecified complications: Secondary | ICD-10-CM | POA: Diagnosis not present

## 2020-11-24 DIAGNOSIS — R0609 Other forms of dyspnea: Secondary | ICD-10-CM

## 2020-11-24 MED ORDER — LIRAGLUTIDE 18 MG/3ML ~~LOC~~ SOPN: 1.8000 mg | PEN_INJECTOR | Freq: Every day | SUBCUTANEOUS | 11 refills | Status: DC

## 2020-11-24 NOTE — Assessment & Plan Note (Signed)
Diabetes longstanding currently controlled with most recent A1c of 6.8 on 10/04/20 and most home readings at goal. Patient is able to verbalize appropriate hypoglycemia management plan. Medication adherence appears disjointed by patient confusion about regimen and issues with the pharmacy. Patient continues to lose weight and expect better control due in part to this weight loss. We had stopped metformin at her last visit but she continued taking due to not being able to get Lantus from her pharmacy. Also reports stopping Jardiance and starting Victoza (not discussed at last visit). Given adequate control on metformin and Victoza alone, will discontinue Lantus and Jardiance since she has already not been taking these. No other compelling indications for Jardiance given normal renal function and no history of ASCVD. Will defer to Dr. Atha Starks if this needs to be resumed in the future. Will also discontinue metformin as her BG are likely to be controlled by Victoza alone and only help alleviate some GI symptoms. Treatment with Victoza should also help her to lose weight.  -Discontinued metformin, Jardiance, and Lantus -Renewed prescription to continue Victoza (liraglutide) 1.8 mg daily -Extensively discussed pathophysiology of diabetes, recommended lifestyle interventions, dietary effects on blood sugar control -Counseled on s/sx of and management of hypoglycemia -Next A1C anticipated 12/2020.

## 2020-11-24 NOTE — Progress Notes (Signed)
S:     Patient arrives in good spirits.  Presents for diabetes evaluation, education, and management. Patient was referred by Primary Care Provider, Dr. Atha Starks, on 10/04/20, last seen on 8/23. Last seen by pharmacy clinic on 7/28 for diabetes. At that time, patient reported success with weight loss. Having significant GI symptoms. We stopped metformin and continued Jardiance and Lantus. Discussed restarting Victoza in the future since she previously tolerated this in 2019 and could benefit her weight loss. Since last visit she was diagnosed and started on treatment for H. Pylori.   Today, patient arrives in good spirits ambulating independently accompanied by her daughter, [name]. Reports that her sleep apnea, breathing, and exercise tolerance have continued to improve with weight loss. She states that she has been taking Victoza 1.8 mg daily but could not get this from her pharmacy so she has been out for the past 3 weeks. Also unable to get Lantus from her pharmacy which she thinks is related to her insurance. She has also stopped taking Jardiance. Per our records, she has not been prescribed Victoza since 2019. BG have been doing well with BG ranging from 110s to 130s, with the highest number of 165 after she ate cake for her daughter's birthday. She also states she is only taking three medications for her H. Pylori infection.She has a colonscopy scheduled on the 29th.   Insurance coverage/medication affordability: Medicaid  Medication adherence reported as daily.  Current diabetes medications include: metformin 1000 mg BID, Jardiance (empagliflozin) 25 mg daily (not taking), Lantus (insulin glargine) 44 units daily (not taking), Victoza (liraglutide) 1.8 mg daily (taking but does not have an active prescription) Current hypertension medications include: None Current hyperlipidemia medications include: None  Patient denies hypoglycemic events.  Patient reported dietary habits: Eats smaller  meals throughout the day. Has cut out sugar and sodas.  Breakfast: boiled egg, toast, grits Dinner: baked chicken, baked potato Snacks: crackers Drinks: has completely cut out sodas (used to drink 20-30 per day), drinking lots of water  Patient-reported exercise habits: increased exercise through exercises in the house and physical therapy   Patient reports nocturia (nighttime urination). 2x per night (stable).  Patient denies neuropathy (nerve pain). Patient denies visual changes. Patient reports self foot exams.    O:  Physical Exam Constitutional:      Appearance: She is obese.  Pulmonary:     Effort: Pulmonary effort is normal.  Neurological:     Mental Status: She is alert.  Psychiatric:        Mood and Affect: Mood normal.        Behavior: Behavior normal.        Thought Content: Thought content normal.   Review of Systems  Constitutional:  Positive for weight loss.  Gastrointestinal:  Positive for nausea.  All other systems reviewed and are negative.  Lab Results  Component Value Date   HGBA1C 6.8 10/04/2020   Vitals:   11/24/20 1337  BP: 134/90  Pulse: 78  SpO2: 97%    Lipid Panel     Component Value Date/Time   CHOL 192 06/21/2020 1547   TRIG 131 06/21/2020 1547   HDL 54 06/21/2020 1547   CHOLHDL 3.6 06/21/2020 1547   LDLCALC 115 (H) 06/21/2020 1547    Home fasting blood sugars: Checks daily. BG 110s to 130s. Lowest was 118, highest 165 x1 after eating cake for her birthday. BG this morning was 132.   Clinical Atherosclerotic Cardiovascular Disease (ASCVD): No  The 10-year  ASCVD risk score (Arnett DK, et al., 2019) is: 3.3%   Values used to calculate the score:     Age: 55 years     Sex: Female     Is Non-Hispanic African American: No     Diabetic: Yes     Tobacco smoker: No     Systolic Blood Pressure: 134 mmHg     Is BP treated: No     HDL Cholesterol: 54 mg/dL     Total Cholesterol: 192 mg/dL    A/P: Diabetes: Diabetes longstanding  currently controlled with most recent A1c of 6.8 on 10/04/20 and most home readings at goal. Patient is able to verbalize appropriate hypoglycemia management plan. Medication adherence appears disjointed by patient confusion about regimen and issues with the pharmacy. Patient continues to lose weight and expect better control due in part to this weight loss. We had stopped metformin at her last visit but she continued taking due to not being able to get Lantus from her pharmacy. Also reports stopping Jardiance and starting Victoza (not discussed at last visit). Given adequate control on metformin and Victoza alone, will discontinue Lantus and Jardiance since she has already not been taking these. No other compelling indications for Jardiance given normal renal function and no history of ASCVD. Will defer to Dr. Atha Starks if this needs to be resumed in the future. Will also discontinue metformin as her BG are likely to be controlled by Victoza alone and only help alleviate some GI symptoms. Treatment with Victoza should also help her to lose weight.  -Discontinued metformin, Jardiance, and Lantus -Renewed prescription to continue Victoza (liraglutide) 1.8 mg daily -Extensively discussed pathophysiology of diabetes, recommended lifestyle interventions, dietary effects on blood sugar control -Counseled on s/sx of and management of hypoglycemia -Next A1C anticipated 12/2020.   H. Pylori:  Patient is currently only taking 3 of 4 medications prescribed for infection: omeprazole, doxycycline, and metronidazole. She reports she was not told she needed to be taking Pepto Bismol since she did not get this when she picked up the other medications at the pharmacy. Discussed that she was to purchase it over the counter. Explained importance of taking all four medications to increase her chances of resolution of infection. She has been taking the other three medications for about 8 days now. She confirmed understanding to  pick up and start taking Pepto Bismol 2 tabs (262 mg) QID to finish her course of treatment as previously prescribed by GI.   Written patient instructions provided. Total time in face to face counseling 40 minutes.   Follow up Pharmacist Clinic Visit in 6 weeks.   Patient seen with Shellia Carwin, PharmD Candidate, Sula Soda, PharmD - PGY-1, and [name], PharmD - PGY2 Pharmacy Resident.

## 2020-11-24 NOTE — Progress Notes (Signed)
Reviewed: I agree with Dr. Koval's documentation and management. 

## 2020-11-24 NOTE — Patient Instructions (Addendum)
It was nice to see you today!  For your H.pylori, you need to be taking FOUR medications:  Omeprazole 20 mg 2 times a day  Pepto Bismol 2 tabs (262 mg each) 4 times a day  Metronidazole 250 mg 4 times a day  Doxycycline 100 mg 2 times a day   Make sure you pick up the Pepto Bismol tablets and start taking this today.   For your diabetes:  Your goal blood sugar is 80-130 before eating and less than 180 after eating.  Medication Changes: Begin Victoza 1.8 mg daily. This will help your blood sugars and with weight loss.   STOP metformin and insulin  Monitor blood sugars at home and keep a log (glucometer or piece of paper) to bring with you to your next visit.  Keep up the good work with diet and exercise. Aim for a diet full of vegetables, fruit and lean meats (chicken, [place], fish). Try to limit salt intake by eating fresh or frozen vegetables (instead of canned), rinse canned vegetables prior to cooking and do not add any additional salt to meals.

## 2020-11-25 ENCOUNTER — Other Ambulatory Visit: Payer: Self-pay | Admitting: *Deleted

## 2020-11-25 DIAGNOSIS — R0600 Dyspnea, unspecified: Secondary | ICD-10-CM

## 2020-11-25 DIAGNOSIS — R0609 Other forms of dyspnea: Secondary | ICD-10-CM

## 2020-11-25 LAB — COMPREHENSIVE METABOLIC PANEL
ALT: 17 IU/L (ref 0–32)
AST: 18 IU/L (ref 0–40)
Albumin/Globulin Ratio: 1.8 (ref 1.2–2.2)
Albumin: 5 g/dL — ABNORMAL HIGH (ref 3.8–4.9)
Alkaline Phosphatase: 96 IU/L (ref 44–121)
BUN/Creatinine Ratio: 20 (ref 9–23)
BUN: 15 mg/dL (ref 6–24)
Bilirubin Total: 0.3 mg/dL (ref 0.0–1.2)
CO2: 27 mmol/L (ref 20–29)
Calcium: 9.8 mg/dL (ref 8.7–10.2)
Chloride: 98 mmol/L (ref 96–106)
Creatinine, Ser: 0.75 mg/dL (ref 0.57–1.00)
Globulin, Total: 2.8 g/dL (ref 1.5–4.5)
Glucose: 118 mg/dL — ABNORMAL HIGH (ref 65–99)
Potassium: 4.6 mmol/L (ref 3.5–5.2)
Sodium: 140 mmol/L (ref 134–144)
Total Protein: 7.8 g/dL (ref 6.0–8.5)
eGFR: 95 mL/min/{1.73_m2} (ref >59)

## 2020-11-25 MED ORDER — ACCU-CHEK SOFTCLIX LANCETS MISC: 12 refills | Status: DC

## 2020-11-25 MED ORDER — PEN NEEDLES 32G X 5 MM MISC: 3 refills | Status: DC

## 2020-11-25 NOTE — Addendum Note (Signed)
Addended by: Kathrin Ruddy on: 11/25/2020 02:50 PM   Modules accepted: Orders

## 2020-11-25 NOTE — Telephone Encounter (Signed)
Pt calls because she needs a new script for the pens for her victoza pens.  Also her lancets.  Jone Baseman, CMA

## 2020-12-02 ENCOUNTER — Ambulatory Visit (HOSPITAL_COMMUNITY)
Admission: RE | Admit: 2020-12-02 | Discharge: 2020-12-02 | Disposition: A | Discharge status: Home / Self Care | Payer: Medicaid Other | Source: Ambulatory Visit | Attending: Surgery | Admitting: Surgery

## 2020-12-02 ENCOUNTER — Encounter (HOSPITAL_COMMUNITY): Payer: Self-pay

## 2020-12-02 ENCOUNTER — Other Ambulatory Visit: Payer: Self-pay

## 2020-12-02 DIAGNOSIS — R590 Localized enlarged lymph nodes: Secondary | ICD-10-CM | POA: Diagnosis not present

## 2020-12-02 DIAGNOSIS — R221 Localized swelling, mass and lump, neck: Secondary | ICD-10-CM | POA: Diagnosis not present

## 2020-12-02 DIAGNOSIS — E041 Nontoxic single thyroid nodule: Secondary | ICD-10-CM | POA: Diagnosis not present

## 2020-12-02 MED ORDER — IOHEXOL 350 MG/ML SOLN
75.0000 mL | Freq: Once | INTRAVENOUS | Status: AC | PRN
  Administered 2020-12-02: 75 mL via INTRAVENOUS

## 2020-12-05 ENCOUNTER — Other Ambulatory Visit: Payer: Self-pay

## 2020-12-05 ENCOUNTER — Encounter (HOSPITAL_COMMUNITY): Payer: Self-pay | Admitting: Gastroenterology

## 2020-12-05 NOTE — Progress Notes (Signed)
    SUBJECTIVE:   CHIEF COMPLAINT / HPI:   Follow-up-abdominal hernia She says on occasion when she lifts something heavy she'll get a bit of pain in the area that improves with rest. She states she had an appointment with a surgeon but decided she doesn't want surgery.  Knee pain She continues to have bilateral knee pain. She has done 4 sessions of PT and is doing the exercises at home. She is using Voltaren gel daily several times per day.  She states she is hopeful to lose a bit more weight before considering any surgery but she knows this may be the next step as she is having some pain while sleeping with her bilateral knees.   PERTINENT  PMH / PSH: History of osteoarthritis of both knees.  OBJECTIVE:   BP 113/79   Pulse 80   Ht 5\' 4"  (1.626 m)   Wt 286 lb 12.8 oz (130.1 kg)   LMP 04/05/2016   SpO2 97%   BMI 49.23 kg/m    General: NAD, pleasant, able to participate in exam Respiratory: No respiratory distress Abdomen: Abdominal hernia present with no signs of incarceration.  No tenderness to palpation. Skin: warm and dry, no rashes noted MSK: Bilateral knees with no tenderness to palpation of the quadriceps or patellar tendons, she does have pain bilaterally with palpation of the median and lateral joint line.  Knee has good stability with negative Lachman's, valgus and varus stress testing.  ASSESSMENT/PLAN:   Bilateral knee pain-osteoarthritis: Continue using Voltaren gel.  Discussed treatments for knee pain including injections, physical therapy which she has completed and is performing physical therapy exercises at home, orthopedic referral for surgery.  She is not interested in doing surgery yet at this time.  She continues to work with physical therapy exercises at home.  She states her insurance will not cover cortisone injections and she is not interested in them at this time.  Abdominal hernia: No signs of incarceration.  She has seen a 04/07/2016 for this but is  hesitant about doing surgery.  I discussed with her that while the risk of incarceration or other adverse effects from the hernia is not 0 they are low and she does have some comorbidities that increase her risk of a bad outcome during surgery.  The patient is hesitant to have the surgery and I told her I do believe it is reasonable to hold off at this time.  Pap smear was performed at health department.  Careers adviser, DO [facility]

## 2020-12-06 ENCOUNTER — Ambulatory Visit (INDEPENDENT_AMBULATORY_CARE_PROVIDER_SITE_OTHER): Payer: Medicaid Other | Admitting: Family Medicine

## 2020-12-06 ENCOUNTER — Encounter: Payer: Self-pay | Admitting: Family Medicine

## 2020-12-06 ENCOUNTER — Other Ambulatory Visit: Payer: Self-pay

## 2020-12-06 VITALS — BP 113/79 | HR 80 | Ht 64.0 in | Wt 286.8 lb

## 2020-12-06 DIAGNOSIS — K469 Unspecified abdominal hernia without obstruction or gangrene: Secondary | ICD-10-CM

## 2020-12-06 DIAGNOSIS — M25561 Pain in right knee: Secondary | ICD-10-CM

## 2020-12-06 DIAGNOSIS — M25562 Pain in left knee: Secondary | ICD-10-CM

## 2020-12-06 DIAGNOSIS — G8929 Other chronic pain: Secondary | ICD-10-CM

## 2020-12-06 NOTE — Patient Instructions (Signed)
Continue using the Voltaren gel for your knees.  Also continue working on the physical therapy exercises.  Once you decide that surgery might be your next step we can get you set up for orthopedics for possible knee surgery.  For the abdominal surgery for your hernia if you are not having any symptoms from the hernia right now I think it is reasonable to hold off on surgery.  I agree with you that you do have some features that put you at a higher risk for a surgery and while the risk of having an issue from your hernia is not 0 it is reasonably low at this time.

## 2020-12-12 ENCOUNTER — Ambulatory Visit: Payer: Medicaid Other

## 2020-12-12 ENCOUNTER — Telehealth: Payer: Self-pay | Admitting: Gastroenterology

## 2020-12-12 NOTE — Telephone Encounter (Signed)
Inbound call from patient to reschedule procedure at [facility] 9/29

## 2020-12-12 NOTE — Telephone Encounter (Signed)
Spoke with patient to reschedule her colonoscopy with Dr. Barron Alvine at [facility]. Pt has been advised that next available hospital date will be 03/01/21. Pt is OK with this date. Pt has been scheduled for a colonoscopy with Dr. Barron Alvine at [facility] on Wednesday, 03/01/21 at 11:45 am. Pt is aware that she will need to arrive at [facility] at 10:15 am with a care partner. Pt will check and make sure that Clenpiq that she has will not be expired by then and if it is she will call us and let us know. Pt is aware that I am mailing her new instructions. Pt verbalized understanding of all information and had no concerns at the end of the call.  Message sent to [facility] with date change.

## 2020-12-13 ENCOUNTER — Other Ambulatory Visit: Payer: Self-pay | Admitting: Gastroenterology

## 2021-01-03 ENCOUNTER — Other Ambulatory Visit: Payer: Self-pay

## 2021-01-04 MED ORDER — TRAMADOL HCL 50 MG PO TABS: 50.0000 mg | ORAL_TABLET | Freq: Two times a day (BID) | ORAL | 0 refills | Status: DC | PRN

## 2021-01-19 DIAGNOSIS — Z23 Encounter for immunization: Secondary | ICD-10-CM | POA: Diagnosis not present

## 2021-02-17 ENCOUNTER — Encounter (HOSPITAL_COMMUNITY): Payer: Self-pay | Admitting: Gastroenterology

## 2021-02-27 ENCOUNTER — Other Ambulatory Visit: Payer: Self-pay

## 2021-02-27 MED ORDER — TRAMADOL HCL 50 MG PO TABS: 50.0000 mg | ORAL_TABLET | Freq: Two times a day (BID) | ORAL | 0 refills | Status: DC | PRN

## 2021-02-27 NOTE — Progress Notes (Signed)
    SUBJECTIVE:   CHIEF COMPLAINT / HPI:   Diabetic Follow Up: Patient is a 55 y.o. female who present today for diabetic follow up.   Patient endorses no problems  Home medications include: Liraglutide 1.8 mg daily Patient endorses taking these medications as prescribed.  Most recent A1Cs:  Lab Results  Component Value Date   HGBA1C 6.4 02/28/2021   HGBA1C 6.8 10/04/2020   HGBA1C 7.0 06/21/2020   Last Microalbumin, LDL, Creatinine: Lab Results  Component Value Date   MICROALBUR 150 10/25/2017   LDLCALC 115 (H) 06/21/2020   CREATININE 0.75 11/24/2020   Patient does not check blood glucose on a regular basis.  Patient is not up to date on diabetic eye. Patient is not up to date on diabetic foot exam.  Hyperlipidemia: Previous LDL of 115 on 06/21/2020.  Patient was previously endorsing not taking her atorvastatin as prescribed.   PERTINENT  PMH / PSH: T2DM  OBJECTIVE:   BP 140/75   Pulse 76   Ht 5\' 4"  (1.626 m)   Wt 279 lb 12.8 oz (126.9 kg)   LMP 04/05/2016   SpO2 100%   BMI 48.03 kg/m    BP recheck 126/78  Diabetic foot exam was performed.  No deformities or other abnormal visual findings.  Posterior tibialis and dorsalis pulse intact bilaterally.  Intact to touch and monofilament testing bilaterally.    General: NAD, pleasant, able to participate in exam Respiratory: No respiratory distress Skin: warm and dry, no rashes noted Psych: Normal affect and mood   ASSESSMENT/PLAN:   Type 2 diabetes mellitus with complication (HCC)  A1c today of 6.4.  Continues on Victoza.  Diabetic foot exam performed today.  Recommended yearly diabetic eye exam.  Follow-up in 3 months.  Hyperlipidemia associated with type 2 diabetes mellitus (HCC) Previous LDL of 115 on 06/21/2020.  Home medications include atorvastatin 40 which she has not been taking for the past several months.  We will check to LDL today.  Strongly encouraged resuming the atorvastatin and I have sent  in more of this to her pharmacy.   08/21/2020, DO [facility]

## 2021-02-28 ENCOUNTER — Other Ambulatory Visit: Payer: Self-pay

## 2021-02-28 ENCOUNTER — Telehealth: Payer: Self-pay | Admitting: Gastroenterology

## 2021-02-28 ENCOUNTER — Encounter: Payer: Self-pay | Admitting: Family Medicine

## 2021-02-28 ENCOUNTER — Ambulatory Visit: Payer: Medicaid Other | Admitting: Family Medicine

## 2021-02-28 VITALS — BP 140/75 | HR 76 | Ht 64.0 in | Wt 279.8 lb

## 2021-02-28 DIAGNOSIS — E118 Type 2 diabetes mellitus with unspecified complications: Secondary | ICD-10-CM

## 2021-02-28 DIAGNOSIS — E785 Hyperlipidemia, unspecified: Secondary | ICD-10-CM | POA: Diagnosis not present

## 2021-02-28 DIAGNOSIS — Z23 Encounter for immunization: Secondary | ICD-10-CM

## 2021-02-28 DIAGNOSIS — E1169 Type 2 diabetes mellitus with other specified complication: Secondary | ICD-10-CM

## 2021-02-28 LAB — POCT GLYCOSYLATED HEMOGLOBIN (HGB A1C): HbA1c, POC (controlled diabetic range): 6.4 % (ref 0.0–7.0)

## 2021-02-28 MED ORDER — ATORVASTATIN CALCIUM 40 MG PO TABS: 40.0000 mg | ORAL_TABLET | Freq: Every day | ORAL | 3 refills | Status: DC

## 2021-02-28 NOTE — Telephone Encounter (Signed)
Colonoscopy appt at [facility] for 03/01/21 has been cancelled. Secure staff message sent to Dr. Barron Alvine to make him aware of cancellation.  Lm on vm for patient to return call to discuss Dr. Frankey Shown next available hospital date.

## 2021-02-28 NOTE — Patient Instructions (Signed)
Today we checked your A1c.  It is at goal.  Continue on the Victoza for now.  Like to see back in about 3 months.  We will going check your LDL cholesterol today.  I sent in a refill of your atorvastatin which is a cholesterol medication which you should be taking.  We also performed your diabetic foot exam today.  Your blood pressure was normal on recheck.  I do recommend that she get a blood pressure cuff for you and your spouse to check blood pressures on a regular basis.  If you see your blood pressures getting in the 130s or higher I would like to see you back.

## 2021-02-28 NOTE — Assessment & Plan Note (Signed)
Previous LDL of 115 on 06/21/2020.  Home medications include atorvastatin 40 which she has not been taking for the past several months.  We will check to LDL today.  Strongly encouraged resuming the atorvastatin and I have sent in more of this to her pharmacy.

## 2021-02-28 NOTE — Assessment & Plan Note (Signed)
A1c today of 6.4.  Continues on Victoza.  Diabetic foot exam performed today.  Recommended yearly diabetic eye exam.  Follow-up in 3 months.

## 2021-02-28 NOTE — Telephone Encounter (Signed)
Patient called states she has a head cold and requested to reschedule hier procedure at the hospital for tomorrow.

## 2021-03-01 ENCOUNTER — Ambulatory Visit (HOSPITAL_COMMUNITY): Admission: RE | Admit: 2021-03-01 | Payer: Medicaid Other | Source: Home / Self Care | Admitting: Gastroenterology

## 2021-03-01 LAB — LDL CHOLESTEROL, DIRECT: LDL Direct: 117 mg/dL — ABNORMAL HIGH (ref 0–99)

## 2021-03-01 SURGERY — COLONOSCOPY WITH PROPOFOL
Anesthesia: Monitor Anesthesia Care

## 2021-03-09 NOTE — Telephone Encounter (Signed)
Called patient to reschedule her hospital colonoscopy. Pt wanted to know if she needed a repeat EGD at that time as well. Pt states that she felt fine after H. Pylori treatment but upper GI symptoms have returned. Pt will sometimes get epigastric pain and bloating. Pt never came back in for H. Pylori stool antigen, pt has been off of PPI for 1 month. Advised pt to come to [facility] to pick up stool kit, she knows that she will have to collect the sample at home and then return it to the lab. Pt is aware that Dr. Cirigliano is out of the office this week and will return next week. Pt is aware that we will call her with recommendations. Pt verbalized understanding and had no concerns at the end of the call. °

## 2021-03-15 NOTE — Telephone Encounter (Signed)
Cirigliano, Vito V, DO  You 2 days ago   If H. pylori stool antigen is still positive, plan for another course of antimicrobial therapy (will be different regimen).  If negative, plan for OV to discuss sxs and determine if repeat EGD is appropriate.     Lm on vm for patient to return call to discuss recommendations.

## 2021-03-17 NOTE — Telephone Encounter (Signed)
Called and spoke with patient in regards to recommendations. Pt states that she will have to find a way to the office so that she can complete the stool study. Pt has been scheduled for a telephone pre-visit on Thursday, 04/27/21 at 10 am. Pt's colonoscopy has been rescheduled to 05/11/21 at 8:15 am. Pt knows that she will have to arrive at 6:45 am with a care partner. Pt is aware that I will send her appt information in the mail. She knows that she will receive a new prep and instructions when she has her pre-visit appt on 2/9. Pt verbalized understanding of all information and had no concerns at the end of the call.

## 2021-03-29 ENCOUNTER — Other Ambulatory Visit: Payer: Self-pay | Admitting: *Deleted

## 2021-03-29 MED ORDER — TRAMADOL HCL 50 MG PO TABS: 50.0000 mg | ORAL_TABLET | Freq: Two times a day (BID) | ORAL | 0 refills | Status: DC | PRN

## 2021-04-07 ENCOUNTER — Other Ambulatory Visit: Payer: Self-pay

## 2021-04-07 ENCOUNTER — Emergency Department (HOSPITAL_COMMUNITY)
Admission: EM | Admit: 2021-04-07 | Discharge: 2021-04-08 | Disposition: A | Discharge status: Home / Self Care | Payer: Medicaid Other | Attending: Emergency Medicine | Admitting: Emergency Medicine

## 2021-04-07 ENCOUNTER — Encounter (HOSPITAL_COMMUNITY): Payer: Self-pay | Admitting: Emergency Medicine

## 2021-04-07 ENCOUNTER — Emergency Department (HOSPITAL_COMMUNITY): Payer: Medicaid Other

## 2021-04-07 DIAGNOSIS — M4319 Spondylolisthesis, multiple sites in spine: Secondary | ICD-10-CM | POA: Diagnosis not present

## 2021-04-07 DIAGNOSIS — R109 Unspecified abdominal pain: Secondary | ICD-10-CM

## 2021-04-07 DIAGNOSIS — N202 Calculus of kidney with calculus of ureter: Secondary | ICD-10-CM | POA: Diagnosis not present

## 2021-04-07 DIAGNOSIS — K439 Ventral hernia without obstruction or gangrene: Secondary | ICD-10-CM | POA: Diagnosis not present

## 2021-04-07 DIAGNOSIS — N1330 Unspecified hydronephrosis: Secondary | ICD-10-CM | POA: Diagnosis not present

## 2021-04-07 LAB — BASIC METABOLIC PANEL
Anion gap: 10 (ref 5–15)
BUN: 14 mg/dL (ref 6–20)
CO2: 27 mmol/L (ref 22–32)
Calcium: 9.5 mg/dL (ref 8.9–10.3)
Chloride: 100 mmol/L (ref 98–111)
Creatinine, Ser: 0.65 mg/dL (ref 0.44–1.00)
GFR, Estimated: >60 mL/min (ref >60)
Glucose, Bld: 119 mg/dL — ABNORMAL HIGH (ref 70–99)
Potassium: 4.5 mmol/L (ref 3.5–5.1)
Sodium: 137 mmol/L (ref 135–145)

## 2021-04-07 LAB — CBC WITH DIFFERENTIAL/PLATELET
Abs Immature Granulocytes: 0.03 10*3/uL (ref 0.00–0.07)
Basophils Absolute: 0 10*3/uL (ref 0.0–0.1)
Basophils Relative: 0 %
Eosinophils Absolute: 0.5 10*3/uL (ref 0.0–0.5)
Eosinophils Relative: 5 %
HCT: 42.9 % (ref 36.0–46.0)
Hemoglobin: 13.8 g/dL (ref 12.0–15.0)
Immature Granulocytes: 0 %
Lymphocytes Relative: 33 %
Lymphs Abs: 3.1 10*3/uL (ref 0.7–4.0)
MCH: 25.1 pg — ABNORMAL LOW (ref 26.0–34.0)
MCHC: 32.2 g/dL (ref 30.0–36.0)
MCV: 78 fL — ABNORMAL LOW (ref 80.0–100.0)
Monocytes Absolute: 0.7 10*3/uL (ref 0.1–1.0)
Monocytes Relative: 7 %
Neutro Abs: 5.1 10*3/uL (ref 1.7–7.7)
Neutrophils Relative %: 55 %
Platelets: 312 10*3/uL (ref 150–400)
RBC: 5.5 MIL/uL — ABNORMAL HIGH (ref 3.87–5.11)
RDW: 15.2 % (ref 11.5–15.5)
WBC: 9.4 10*3/uL (ref 4.0–10.5)
nRBC: 0 % (ref 0.0–0.2)

## 2021-04-07 LAB — URINALYSIS, ROUTINE W REFLEX MICROSCOPIC
Bilirubin Urine: NEGATIVE
Glucose, UA: NEGATIVE mg/dL
Hgb urine dipstick: NEGATIVE
Ketones, ur: NEGATIVE mg/dL
Leukocytes,Ua: NEGATIVE
Nitrite: NEGATIVE
Protein, ur: NEGATIVE mg/dL
Specific Gravity, Urine: 1.01 (ref 1.005–1.030)
pH: 5 (ref 5.0–8.0)

## 2021-04-07 NOTE — ED Provider Triage Note (Signed)
Emergency Medicine Provider Triage Evaluation Note  Rita Bass , a 56 y.o. female  was evaluated in triage.  Pt complains of right low back pain.  States her symptoms started about 2 days ago.  She has been taking Advil with little relief.  States her pain radiates along the right abdomen and right inguinal region.  Reports a history of kidney stones and feels that her current symptoms are similar to prior kidney stones.  Denies any nausea, vomiting, urinary complaints.  Physical Exam  BP (!) 126/93 (BP Location: Right Arm)    Pulse 89    Temp 97.6 F (36.4 C) (Oral)    Resp 19    Ht 5\' 4"  (1.626 m)    Wt (!) 140 kg    LMP 04/05/2016    SpO2 98%    BMI 52.98 kg/m  Gen:   Awake, no distress   Resp:  Normal effort  MSK:   Moves extremities without difficulty  Other:    Medical Decision Making  Medically screening exam initiated at 10:07 PM.  Appropriate orders placed.  Viona Ledvina was informed that the remainder of the evaluation will be completed by another provider, this initial triage assessment does not replace that evaluation, and the importance of remaining in the ED until their evaluation is complete.   Rayna Sexton, PA-C 04/07/21 2208

## 2021-04-07 NOTE — ED Triage Notes (Signed)
Patient reports right low back pain radiating to right groin for 2 days , denies injury or fall /no hematuria , patient suspects kidney stone, no emesis or fever .

## 2021-04-08 MED ORDER — HYDROCODONE-ACETAMINOPHEN 5-325 MG PO TABS: 1.0000 | ORAL_TABLET | Freq: Four times a day (QID) | ORAL | 0 refills | Status: DC | PRN

## 2021-04-08 NOTE — ED Provider Notes (Signed)
[facility] EMERGENCY DEPARTMENT Provider Note   CSN: [id number] Arrival date & time: 04/07/21  2126     History  Chief Complaint  Patient presents with   Back Pain    Rita Bass is a 56 y.o. female.  Patient with a complaint of right flank pain radiating to the right groin for 3 days.  Patient's had a history of kidney stones in the past.  Reminds her of that.  No nausea no vomiting no fevers.  Patient also does have a history of musculoskeletal back pain.  [facility].  In addition patient is known to have a large ventral hernia.  Patient has been taking Motrin at home for the pain.  Patient denies any anterior abdominal pain.  Past medical history significant for obesity history of kidney stones, diabetes gastroesophageal reflux disease, chronic pain left knee.  History of back pain.  Patient's had gallbladder removed.  And patient several years ago had a stent in her right ureter for kidney stone at that time.        Home Medications Prior to Admission medications   Medication Sig Start Date End Date Taking? Authorizing Provider  Accu-Chek Softclix Lancets lancets Use as instructed 11/25/20   Zenia Resides, MD  acetaminophen (TYLENOL 8 HOUR) 650 MG CR tablet Take 1 tablet (650 mg total) by mouth every 8 (eight) hours as needed for pain. Patient taking differently: Take 1,300 mg by mouth every 8 (eight) hours as needed for pain. 12/16/17   Bonnita Hollow, MD  atorvastatin (LIPITOR) 40 MG tablet Take 1 tablet (40 mg total) by mouth daily. 02/28/21   Lurline Del, DO  cholestyramine (QUESTRAN) 4 g packet Take 1 packet by mouth daily. Patient not taking: Reported on 11/24/2020 11/22/20   [provider]  gabapentin (NEURONTIN) 100 MG capsule Take 100 mg by mouth 3 (three) times daily. Patient not taking: Reported on 11/24/2020 11/18/20   [provider]  glucose blood (ACCU-CHEK AVIVA) test strip  Use as instructed 11/14/17   Diallo, Earna Coder, MD  Insulin Pen Needle (PEN NEEDLES) 32G X 5 MM MISC Use as directed with victoza 11/25/20   Hensel, Jamal Collin, MD  liraglutide (VICTOZA) 18 MG/3ML SOPN Inject 1.8 mg into the skin daily. Inject 1.8 mg into the abdomen once daily. 11/24/20   Zenia Resides, MD  omeprazole (PRILOSEC OTC) 20 MG tablet Take 1 tablet (20 mg total) by mouth 2 (two) times daily. Take BID x4 weeks, then reduce to daily. Take 30-60 minutes before breakfast and dinner. 11/01/20 12/01/20  Cirigliano, Vito V, DO  omeprazole (PRILOSEC) 20 MG capsule Take 20 mg by mouth 2 (two) times daily. 11/30/20   [provider]  ondansetron (ZOFRAN ODT) 4 MG disintegrating tablet Take 1 tablet (4 mg) 30-60 minutes prior to starting bowel prep and an additional tablet (4 mg) every 4 hours as needed for nausea and vomiting during prep Patient not taking: Reported on 11/24/2020 11/01/20   Cirigliano, Vito V, DO  simethicone (GAS-X) 80 MG chewable tablet Chew 1 tablet (80 mg total) by mouth every 6 (six) hours as needed for flatulence. 09/15/18   Bonnita Hollow, MD  Sod Picosulfate-Mag Ox-Cit [facility]) 10-3.5-12 MG-GM -GM/160ML SOLN Take 1 kit by mouth as directed. Patient not taking: Reported on 11/24/2020 11/01/20   Cirigliano, Vito V, DO  traMADol (ULTRAM) 50 MG tablet Take 1 tablet (50 mg total) by mouth every 12 (twelve)  hours as needed for moderate pain. 03/29/21   Lurline Del, DO  lisinopril (PRINIVIL,ZESTRIL) 10 MG tablet Take 1 tablet (10 mg total) by mouth daily. 10/25/17 12/23/19  Bonnita Hollow, MD      Allergies    Patient has no known allergies.    Review of Systems   Review of Systems  Constitutional:  Negative for chills and fever.  HENT:  Negative for ear pain and sore throat.   Eyes:  Negative for pain and visual disturbance.  Respiratory:  Negative for cough and shortness of breath.   Cardiovascular:  Negative for chest pain and palpitations.  Gastrointestinal:   Negative for abdominal pain and vomiting.  Genitourinary:  Positive for flank pain. Negative for dysuria and hematuria.  Musculoskeletal:  Negative for arthralgias and back pain.  Skin:  Negative for color change and rash.  Neurological:  Negative for seizures and syncope.  All other systems reviewed and are negative.  Physical Exam Updated Vital Signs BP 115/83    Pulse 78    Temp 97.6 F (36.4 C) (Oral)    Resp 16    Ht 1.626 m ($Remove'5\' 4"'AOZldpb$ )    Wt (!) 140 kg    LMP 04/05/2016    SpO2 96%    BMI 52.98 kg/m  Physical Exam Vitals and nursing note reviewed.  Constitutional:      General: She is not in acute distress.    Appearance: She is well-developed.  HENT:     Head: Normocephalic and atraumatic.  Eyes:     Conjunctiva/sclera: Conjunctivae normal.  Cardiovascular:     Rate and Rhythm: Normal rate and regular rhythm.     Heart sounds: No murmur heard. Pulmonary:     Effort: Pulmonary effort is normal. No respiratory distress.     Breath sounds: Normal breath sounds.  Abdominal:     Palpations: Abdomen is soft.     Tenderness: There is no abdominal tenderness.     Hernia: A hernia is present.     Comments: Large ventral hernia nontender.  Reducible.  Abdomen is nontender in all quadrants.  Musculoskeletal:        General: No swelling.     Cervical back: Neck supple.  Skin:    General: Skin is warm and dry.     Capillary Refill: Capillary refill takes less than 2 seconds.  Neurological:     General: No focal deficit present.     Mental Status: She is alert and oriented to person, place, and time.  Psychiatric:        Mood and Affect: Mood normal.    ED Results / Procedures / Treatments   Labs (all labs ordered are listed, but only abnormal results are displayed) Labs Reviewed  URINALYSIS, ROUTINE W REFLEX MICROSCOPIC - Abnormal; Notable for the following components:      Result Value   APPearance HAZY (*)    All other components within normal limits  CBC WITH  DIFFERENTIAL/PLATELET - Abnormal; Notable for the following components:   RBC 5.50 (*)    MCV 78.0 (*)    MCH 25.1 (*)    All other components within normal limits  BASIC METABOLIC PANEL - Abnormal; Notable for the following components:   Glucose, Bld 119 (*)    All other components within normal limits    EKG None  Radiology CT Renal Stone Study  Result Date: 04/07/2021 CLINICAL DATA:  Flank pain. EXAM: CT ABDOMEN AND PELVIS WITHOUT CONTRAST TECHNIQUE: Multidetector CT imaging  of the abdomen and pelvis was performed following the standard protocol without IV contrast. RADIATION DOSE REDUCTION: This exam was performed according to the departmental dose-optimization program which includes automated exposure control, adjustment of the mA and/or kV according to patient size and/or use of iterative reconstruction technique. COMPARISON:  October 11, 2020 FINDINGS: Lower chest: No acute abnormality. Hepatobiliary: No focal liver abnormality is seen. Status post cholecystectomy. No biliary dilatation. Pancreas: Unremarkable. No pancreatic ductal dilatation or surrounding inflammatory changes. Spleen: Normal in size without focal abnormality. Adrenals/Urinary Tract: Adrenal glands are unremarkable. Kidneys are normal in size, without focal lesions. Mild, stable left-sided hydronephrosis is noted. A 3 mm nonobstructing renal calculus is seen within the lower pole of the left kidney. A 1 mm to 2 mm renal calculus is seen within the distal left ureter (axial CT image 70, CT series 3). No significant hydronephrosis is identified. Bladder is unremarkable. Stomach/Bowel: Stomach is within normal limits. Appendix appears normal. No evidence of bowel wall thickening, distention, or inflammatory changes. Vascular/Lymphatic: No significant vascular findings are present. No enlarged abdominal or pelvic lymph nodes. Reproductive: Uterus and bilateral adnexa are unremarkable. Other: An 11.1 cm x 4.7 cm ventral hernia is  seen to the right of midline of the mid to lower abdominal wall. This contains fat and a short segment of mildly thickened transverse colon. No abdominopelvic ascites. Musculoskeletal: There is mild anterolisthesis of the L4 vertebral body on L5 with mild retrolisthesis of L5 on S1. Moderate to marked severity degenerative changes are also noted at these levels. IMPRESSION: 1. 1 mm to 2 mm distal left ureteral calculus. 2. 3 mm nonobstructing left renal calculus. 3. Large ventral hernia, involving the mid to lower abdominal wall, containing fat and a short segment of mildly thickened transverse colon. Focal colitis involving this segment of large bowel cannot be excluded. Electronically Signed   By: Virgina Norfolk M.D.   On: 04/07/2021 22:37    Procedures Procedures    Medications Ordered in ED Medications - No data to display  ED Course/ Medical Decision Making/ A&P                           Medical Decision Making Amount and/or Complexity of Data Reviewed Labs: ordered.  Patient's labs are very reassuring no leukocytosis.  Hemoglobin is normal.  Basic metabolic panel blood sugar is 119 renal functions normal.  Urinalysis is negative for urinary tract infection.  Initial CT scan read by the overnight radiologist stated that there was a left ureter distal 1 to 2 mm stone.  Obviously patient's pain is all in the right side.  She has no pain on the left.  I contacted radiology to review the scan.  And daytime radiologist feels that there is no evidence of a ureteral calculus on either side.  There is some stones up in the pelvis of the kidneys.  But these would be asymptomatic.  Essentially CT scan had no explanation for the pain.  There is the large ventral hernia may be with a little bit of inflammation in the colon.  But patient's abdomen is completely nontender.  No concerns for incarcerated hernia there is a large ventral hernia it is soft and nontender no concerns for any kind of  entrapment.  Based on this most likely this is musculoskeletal type pain.  We will treat and have her follow-up with [facility].  Patient is okay with this plan.  Does not  need a work note.   Final Clinical Impression(s) / ED Diagnoses Final diagnoses:  Flank pain    Rx / DC Orders ED Discharge Orders     None         Fredia Sorrow, MD 04/08/21 [phone]

## 2021-04-08 NOTE — Discharge Instructions (Signed)
We review of your CT scan showed no evidence of any ureteral stones to explain the flank pain.  Continue to take the tramadol that you have been prescribed.  Can supplement with the hydrocodone prescription provided.  Make an appointment to follow-up with [name].  Return for any new or worse symptoms.

## 2021-04-08 NOTE — ED Notes (Signed)
Pt denies loss of bowel or bladder, numbness and tingling. Pt states able to walk.

## 2021-04-10 ENCOUNTER — Other Ambulatory Visit: Payer: Self-pay | Admitting: Family Medicine

## 2021-04-10 ENCOUNTER — Telehealth: Payer: Self-pay

## 2021-04-10 DIAGNOSIS — R0609 Other forms of dyspnea: Secondary | ICD-10-CM

## 2021-04-10 NOTE — Telephone Encounter (Signed)
Transition Care Management Unsuccessful Follow-up Telephone Call  Date of discharge and from where:  04/08/2021-[facility]   Attempts:  1st Attempt  Reason for unsuccessful TCM follow-up call:  Unable to reach patient

## 2021-04-11 NOTE — Telephone Encounter (Signed)
Transition Care Management Unsuccessful Follow-up Telephone Call  Date of discharge and from where:  04/08/2021 from [facility] Attempts:  2nd Attempt  Reason for unsuccessful TCM follow-up call:  Unable to reach patient

## 2021-04-12 ENCOUNTER — Ambulatory Visit: Payer: Medicaid Other | Admitting: Family Medicine

## 2021-04-12 NOTE — Telephone Encounter (Signed)
Transition Care Management Unsuccessful Follow-up Telephone Call  Date of discharge and from where:  04/08/2021-   Attempts:  3rd Attempt  Reason for unsuccessful TCM follow-up call:  Unable to reach patient

## 2021-04-18 ENCOUNTER — Telehealth: Payer: Self-pay

## 2021-04-18 NOTE — Telephone Encounter (Signed)
Patient calls nurse line regarding severe abdominal pain and vomiting. Patient reports that she has been vomiting all day today and she has not been able to keep anything down. Patient was told at recent ED visit that she had a large ventral hernia and is concerned about this due to the vomiting she is experiencing.    Patient rates abdominal pain at 5/10 while on pain medication. Patient has been taking norco pain medication as prescribed. Advised patient that she should be re-evaluated in ED for re evaluation of abdominal pain and inability to hold down fluids. Patient verbalizes understanding.   Veronda Prude, RN

## 2021-04-18 NOTE — Progress Notes (Signed)
° ° °  SUBJECTIVE:   CHIEF COMPLAINT / HPI:   Follow-up-abdominal pain: Patient was seen in the emergency department on 1/20 for complaints of flank and groin pain for about 3 days with a history of kidney stones.  CT showed questionable stone on the left side, however patient's pain was on the right side.  The ED provider spoke with the daytime radiologist who felt there was no evidence of a ureteral stone on either side but that some stones were present in the pelvis of the kidneys however these would be asymptomatic.  Overall the CT did not have any explanation for the patient's pain other than a large ventral hernia and questionable small amount of inflammation in the colon.  Due to all of this it was thought this was likely musculoskeletal and the patient presents today for follow-up on this.  Today she states she had abdominal discomfort in the center of the abdomen. She states she had some gas earlier this week and noticed a larger knot at the site of her hernia that was painful and didn't reduce .  PERTINENT  PMH / PSH: Chronic abdominal pain  OBJECTIVE:   BP 123/87    Pulse 78    Ht 5\' 4"  (1.626 m)    Wt 278 lb (126.1 kg)    LMP 04/05/2016    SpO2 100%    BMI 47.72 kg/m    General: NAD, pleasant, able to participate in exam Cardiac: RRR, no murmurs. Respiratory: CTAB, normal effort, No wheezes, rales or rhonchi Abdomen: Large ventral hernia present as before, no signs of incarceration at this time, mild abdominal discomfort diffusely on palpation similar to previous exam. Psych: Normal affect and mood  ASSESSMENT/PLAN:   Follow-up from abdominal discomfort: Patient presents for follow-up from the ED after being seen for concern of a kidney stone.  This pain has improved but she has been having more recent pains at the site of her ventral hernia.  Last week she had a bulge at the site of the ventral hernia which did not reduce and was extremely painful.  She went to the ED but  ultimately left without being seen.  This gives some concern for a partially incarcerated hernia at that time though it did ultimately reduce on its own.  She has a BMI greater than 45 which puts her at increased risk from surgical complications.  Ultimately she should still be evaluated by a surgeon so we will place a referral for general surgery to see if there are any options from their standpoint.  She has an upcoming colonoscopy later this month which we have been awaiting to get further information on her chronic abdominal discomfort, nausea, vomiting.  Discussed strict return precautions if she develops any other signs of an incarcerated hernia and that she should go to the ER should any of these symptoms occur.  04/07/2016, DO [facility]

## 2021-04-21 ENCOUNTER — Other Ambulatory Visit: Payer: Self-pay

## 2021-04-21 ENCOUNTER — Ambulatory Visit: Payer: Medicaid Other | Admitting: Family Medicine

## 2021-04-21 ENCOUNTER — Encounter: Payer: Self-pay | Admitting: Family Medicine

## 2021-04-21 VITALS — BP 123/87 | HR 78 | Ht 64.0 in | Wt 278.0 lb

## 2021-04-21 DIAGNOSIS — R1084 Generalized abdominal pain: Secondary | ICD-10-CM

## 2021-04-21 DIAGNOSIS — K439 Ventral hernia without obstruction or gangrene: Secondary | ICD-10-CM | POA: Diagnosis not present

## 2021-04-21 MED ORDER — TRAMADOL HCL 50 MG PO TABS: 50.0000 mg | ORAL_TABLET | Freq: Two times a day (BID) | ORAL | 0 refills | Status: DC | PRN

## 2021-04-21 MED ORDER — OMEPRAZOLE 20 MG PO CPDR: 20.0000 mg | DELAYED_RELEASE_CAPSULE | Freq: Two times a day (BID) | ORAL | 1 refills | Status: DC

## 2021-04-21 NOTE — Patient Instructions (Signed)
I referred you to general surgery for the hernia.  They should reach out to you in the next 1 to 2 weeks to schedule you for an initial evaluation.  If you develop any worsening abdominal pain, particularly with a bulge at the site of the hernia that was not reduced you should go to the ER.  Hopefully we can get the results of your colonoscopy later this month and it would give [name] more ideas of what is going on with your chronic abdominal discomfort, diarrhea, nausea.  I would like to see back a few weeks after the colonoscopy is done to discuss next steps based off what it shows.

## 2021-04-22 ENCOUNTER — Other Ambulatory Visit: Payer: Self-pay | Admitting: Gastroenterology

## 2021-04-24 ENCOUNTER — Telehealth: Payer: Self-pay | Admitting: General Surgery

## 2021-04-24 NOTE — Telephone Encounter (Signed)
Contacted the patient and discussed with her that we received a request for Clenpiq. The patient has a sample of clenpiq with an expiration of 05/2021. She is able to use what she has. No refill needed. Patient verbalized understanding

## 2021-04-27 ENCOUNTER — Ambulatory Visit (AMBULATORY_SURGERY_CENTER): Payer: Medicaid Other | Admitting: *Deleted

## 2021-04-27 ENCOUNTER — Other Ambulatory Visit: Payer: Self-pay

## 2021-04-27 VITALS — Ht 64.0 in | Wt 278.0 lb

## 2021-04-27 DIAGNOSIS — K529 Noninfective gastroenteritis and colitis, unspecified: Secondary | ICD-10-CM

## 2021-04-27 NOTE — Progress Notes (Signed)
No egg or soy allergy known to patient  No issues known to pt with past sedation with any surgeries or procedures- ponv  Patient denies ever being told they had issues or difficulty with intubation  No FH of Malignant Hyperthermia Pt is not on diet pills Pt is not on  home 02  Pt is not on blood thinners  Pt denies issues with constipation  No A fib or A flutter  Pt is fully vaccinated  for Covid   Pt has Clenpiq and Zofran at home    Due to the COVID-19 pandemic we are asking patients to follow certain guidelines in PV and the [place]   Pt aware of COVID protocols and [facility] guidelines   PV completed over the phone. Pt verified name, DOB, address and insurance during PV today.  Pt mailed instruction packet with copy of consent form to read and not return, and instructions.  Pt encouraged to call with questions or issues.  If pt has My chart, procedure instructions sent via My Chart

## 2021-05-03 ENCOUNTER — Encounter (HOSPITAL_COMMUNITY): Payer: Self-pay | Admitting: Gastroenterology

## 2021-05-03 NOTE — Progress Notes (Signed)
Attempted to obtain medical history via telephone, unable to reach at this time. I left a voicemail to return pre surgical testing department's phone call.  

## 2021-05-09 ENCOUNTER — Other Ambulatory Visit: Payer: Self-pay | Admitting: *Deleted

## 2021-05-09 MED ORDER — TRAMADOL HCL 50 MG PO TABS: 50.0000 mg | ORAL_TABLET | Freq: Two times a day (BID) | ORAL | 0 refills | Status: DC | PRN

## 2021-05-09 NOTE — Telephone Encounter (Signed)
Patient is calling for a refill on her tramadol.  She also wanted to let provider know she is scheduled for her colonoscopy on 06/08/21 and her appointment with [facility] on 05/22/21.  Will forward to MD.  Rita Bass

## 2021-05-11 ENCOUNTER — Ambulatory Visit (HOSPITAL_COMMUNITY): Payer: Medicaid Other | Admitting: Anesthesiology

## 2021-05-11 ENCOUNTER — Encounter (HOSPITAL_COMMUNITY): Payer: Self-pay | Admitting: Gastroenterology

## 2021-05-11 ENCOUNTER — Other Ambulatory Visit: Payer: Self-pay

## 2021-05-11 ENCOUNTER — Telehealth: Payer: Self-pay | Admitting: Gastroenterology

## 2021-05-11 ENCOUNTER — Ambulatory Visit (HOSPITAL_COMMUNITY)
Admission: RE | Admit: 2021-05-11 | Discharge: 2021-05-11 | Disposition: A | Discharge status: Home / Self Care | Payer: Medicaid Other | Attending: Gastroenterology | Admitting: Gastroenterology

## 2021-05-11 ENCOUNTER — Encounter (HOSPITAL_COMMUNITY)
Admission: RE | Disposition: A | Discharge status: Home / Self Care | Payer: Self-pay | Source: Home / Self Care | Attending: Gastroenterology

## 2021-05-11 ENCOUNTER — Ambulatory Visit (HOSPITAL_BASED_OUTPATIENT_CLINIC_OR_DEPARTMENT_OTHER): Payer: Medicaid Other | Admitting: Anesthesiology

## 2021-05-11 DIAGNOSIS — K219 Gastro-esophageal reflux disease without esophagitis: Secondary | ICD-10-CM | POA: Diagnosis not present

## 2021-05-11 DIAGNOSIS — M1990 Unspecified osteoarthritis, unspecified site: Secondary | ICD-10-CM | POA: Diagnosis not present

## 2021-05-11 DIAGNOSIS — K529 Noninfective gastroenteritis and colitis, unspecified: Secondary | ICD-10-CM | POA: Insufficient documentation

## 2021-05-11 DIAGNOSIS — I10 Essential (primary) hypertension: Secondary | ICD-10-CM

## 2021-05-11 DIAGNOSIS — G4733 Obstructive sleep apnea (adult) (pediatric): Secondary | ICD-10-CM | POA: Insufficient documentation

## 2021-05-11 DIAGNOSIS — J45909 Unspecified asthma, uncomplicated: Secondary | ICD-10-CM | POA: Diagnosis not present

## 2021-05-11 DIAGNOSIS — K648 Other hemorrhoids: Secondary | ICD-10-CM | POA: Diagnosis not present

## 2021-05-11 DIAGNOSIS — K641 Second degree hemorrhoids: Secondary | ICD-10-CM | POA: Diagnosis not present

## 2021-05-11 DIAGNOSIS — Z6841 Body Mass Index (BMI) 40.0 and over, adult: Secondary | ICD-10-CM | POA: Insufficient documentation

## 2021-05-11 DIAGNOSIS — K644 Residual hemorrhoidal skin tags: Secondary | ICD-10-CM

## 2021-05-11 DIAGNOSIS — E669 Obesity, unspecified: Secondary | ICD-10-CM | POA: Insufficient documentation

## 2021-05-11 DIAGNOSIS — Z1211 Encounter for screening for malignant neoplasm of colon: Secondary | ICD-10-CM | POA: Insufficient documentation

## 2021-05-11 DIAGNOSIS — K6389 Other specified diseases of intestine: Secondary | ICD-10-CM | POA: Diagnosis not present

## 2021-05-11 HISTORY — PX: BIOPSY: SHX5522

## 2021-05-11 HISTORY — PX: COLONOSCOPY WITH PROPOFOL: SHX5780

## 2021-05-11 LAB — GLUCOSE, CAPILLARY: Glucose-Capillary: 129 mg/dL — ABNORMAL HIGH (ref 70–99)

## 2021-05-11 SURGERY — COLONOSCOPY WITH PROPOFOL
Anesthesia: Monitor Anesthesia Care

## 2021-05-11 MED ORDER — LIDOCAINE 2% (20 MG/ML) 5 ML SYRINGE
INTRAMUSCULAR | Status: DC | PRN
  Administered 2021-05-11: 100 mg via INTRAVENOUS

## 2021-05-11 MED ORDER — HYDROCORTISONE ACETATE 25 MG RE SUPP: 25.0000 mg | Freq: Every day | RECTAL | 0 refills | Status: AC

## 2021-05-11 MED ORDER — PROPOFOL 10 MG/ML IV BOLUS
INTRAVENOUS | Status: DC | PRN
  Administered 2021-05-11: 50 mg via INTRAVENOUS

## 2021-05-11 MED ORDER — LACTATED RINGERS IV SOLN: INTRAVENOUS | Status: DC | PRN

## 2021-05-11 MED ORDER — PROPOFOL 500 MG/50ML IV EMUL
INTRAVENOUS | Status: DC | PRN
  Administered 2021-05-11: 125 ug/kg/min via INTRAVENOUS

## 2021-05-11 SURGICAL SUPPLY — 22 items

## 2021-05-11 NOTE — Anesthesia Procedure Notes (Signed)
Date/Time: 05/11/2021 8:51 AM Performed by: Cynda Familia, CRNA Pre-anesthesia Checklist: Patient identified, Emergency Drugs available, Suction available, Patient being monitored and Timeout performed Patient Re-evaluated:Patient Re-evaluated prior to induction Oxygen Delivery Method: Simple face mask Placement Confirmation: positive ETCO2 and breath sounds checked- equal and bilateral Dental Injury: Teeth and Oropharynx as per pre-operative assessment
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
## 2021-05-11 NOTE — Discharge Instructions (Signed)
YOU HAD AN ENDOSCOPIC PROCEDURE TODAY: Refer to the procedure report and other information in the discharge instructions given to you for any specific questions about what was found during the examination. If this information does not answer your questions, please call [place] office at [phone] to clarify.  ° °YOU SHOULD EXPECT: Some feelings of bloating in the abdomen. Passage of more gas than usual. Walking can help get rid of the air that was put into your GI tract during the procedure and reduce the bloating. If you had a lower endoscopy (such as a colonoscopy or flexible sigmoidoscopy) you may notice spotting of blood in your stool or on the toilet paper. Some abdominal soreness may be present for a day or two, also. ° °DIET: Your first meal following the procedure should be a light meal and then it is ok to progress to your normal diet. A half-sandwich or bowl of soup is an example of a good first meal. Heavy or fried foods are harder to digest and may make you feel nauseous or bloated. Drink plenty of fluids but you should avoid alcoholic beverages for 24 hours. If you had a esophageal dilation, please see attached instructions for diet.   ° °ACTIVITY: Your care partner should take you home directly after the procedure. You should plan to take it easy, moving slowly for the rest of the day. You can resume normal activity the day after the procedure however YOU SHOULD NOT DRIVE, use power tools, machinery or perform tasks that involve climbing or major physical exertion for 24 hours (because of the sedation medicines used during the test).  ° °SYMPTOMS TO REPORT IMMEDIATELY: °A gastroenterologist can be reached at any hour. Please call [phone]  for any of the following symptoms:  °Following lower endoscopy (colonoscopy, flexible sigmoidoscopy) °Excessive amounts of blood in the stool  °Significant tenderness, worsening of abdominal pains  °Swelling of the abdomen that is new, acute  °Fever of 100° or  higher  °Following upper endoscopy (EGD, EUS, ERCP, esophageal dilation) °Vomiting of blood or coffee ground material  °New, significant abdominal pain  °New, significant chest pain or pain under the shoulder blades  °Painful or persistently difficult swallowing  °New shortness of breath  °Black, tarry-looking or red, bloody stools ° °FOLLOW UP:  °If any biopsies were taken you will be contacted by phone or by letter within the next 1-3 weeks. Call [phone]  if you have not heard about the biopsies in 3 weeks.  °Please also call with any specific questions about appointments or follow up tests. ° °

## 2021-05-11 NOTE — Telephone Encounter (Signed)
Per procedure report from today - "Use hydrocortisone suppository 25 mg 1 per rectum once a day for 5 days for inflamed internal hemorrhoid noted on this study"  RX for Anusol suppositories sent to requested pharmacy. Left pt a detailed message letting her know that we have sent in the prescription. I told pt to call us back if she had any questions or concerns.

## 2021-05-11 NOTE — Transfer of Care (Signed)
Immediate Anesthesia Transfer of Care Note  Patient: Rita Bass  Procedure(s) Performed: COLONOSCOPY WITH PROPOFOL BIOPSY  Patient Location: PACU and Endoscopy Unit  Anesthesia Type:MAC  Level of Consciousness: awake and alert   Airway & Oxygen Therapy: Patient Spontanous Breathing and Patient connected to face mask oxygen  Post-op Assessment: Report given to RN and Post -op Vital signs reviewed and stable  Post vital signs: Reviewed and stable  Last Vitals:  Vitals Value Taken Time  BP    Temp    Pulse 85 05/11/21 0933  Resp 15 05/11/21 0933  SpO2 91 % 05/11/21 0933  Vitals shown include unvalidated device data.  Last Pain:  Vitals:   05/11/21 0741  TempSrc: Temporal  PainSc: 0-No pain         Complications: No notable events documented.

## 2021-05-11 NOTE — Telephone Encounter (Signed)
Inbound call from patient. Had procedure 2/23 at [facility] and was sent a prescription for hemorrhoids. States pharmacy does not have the prescription [facility] on [address].

## 2021-05-11 NOTE — Anesthesia Preprocedure Evaluation (Addendum)
Anesthesia Evaluation  Patient identified by MRN, date of birth, ID band Patient awake    Reviewed: Allergy & Precautions, NPO status , Patient's Chart, lab work & pertinent test results  History of Anesthesia Complications (+) PONV and history of anesthetic complications  Airway Mallampati: II  TM Distance: >3 FB Neck ROM: Full    Dental no notable dental hx. (+) Teeth Intact, Dental Advisory Given   Pulmonary asthma , sleep apnea ,    Pulmonary exam normal breath sounds clear to auscultation       Cardiovascular hypertension, + Orthopnea  Normal cardiovascular exam Rhythm:Regular Rate:Normal     Neuro/Psych negative neurological ROS  negative psych ROS   GI/Hepatic Neg liver ROS, GERD  Medicated and Controlled,  Endo/Other  diabetesMorbid obesity (BMI 48)  Renal/GU negative Renal ROS  negative genitourinary   Musculoskeletal  (+) Arthritis ,   Abdominal   Peds  Hematology negative hematology ROS (+)   Anesthesia Other Findings   Reproductive/Obstetrics                           Anesthesia Physical Anesthesia Plan  ASA: 3  Anesthesia Plan: MAC   Post-op Pain Management:    Induction: Intravenous  PONV Risk Score and Plan: Propofol infusion and Treatment may vary due to age or medical condition  Airway Management Planned: Natural Airway  Additional Equipment:   Intra-op Plan:   Post-operative Plan:   Informed Consent: I have reviewed the patients History and Physical, chart, labs and discussed the procedure including the risks, benefits and alternatives for the proposed anesthesia with the patient or authorized representative who has indicated his/her understanding and acceptance.     Dental advisory given  Plan Discussed with: CRNA  Anesthesia Plan Comments:         Anesthesia Quick Evaluation

## 2021-05-11 NOTE — Interval H&P Note (Signed)
History and Physical Interval Note:  05/11/2021 8:32 AM  Rita Bass  has presented today for surgery, with the diagnosis of chronic diarrhea, hx of ventral hernia, abdominal mass, BMI > 50.  The various methods of treatment have been discussed with the patient and family. After consideration of risks, benefits and other options for treatment, the patient has consented to  Procedure(s): COLONOSCOPY WITH PROPOFOL (N/A) as a surgical intervention.  The patient's history has been reviewed, patient examined, no change in status, stable for surgery.  I have reviewed the patient's chart and labs.  Questions were answered to the patient's satisfaction.     Dominic Pea Armando Bukhari

## 2021-05-11 NOTE — Anesthesia Postprocedure Evaluation (Signed)
Anesthesia Post Note  Patient: Rita Bass  Procedure(s) Performed: COLONOSCOPY WITH PROPOFOL BIOPSY     Patient location during evaluation: Endoscopy Anesthesia Type: MAC Level of consciousness: awake and alert Pain management: pain level controlled Vital Signs Assessment: post-procedure vital signs reviewed and stable Respiratory status: spontaneous breathing, nonlabored ventilation, respiratory function stable and patient connected to nasal cannula oxygen Cardiovascular status: blood pressure returned to baseline and stable Postop Assessment: no apparent nausea or vomiting Anesthetic complications: no   No notable events documented.  Last Vitals:  Vitals:   05/11/21 0943 05/11/21 0953  BP: 132/63 119/73  Pulse: 73 77  Resp: 17 13  Temp:    SpO2: 94% 91%    Last Pain:  Vitals:   05/11/21 0953  TempSrc:   PainSc: 0-No pain                 Laylia Mui L Neely Kammerer

## 2021-05-11 NOTE — Op Note (Signed)
[facility] Patient Name: Rita Bass Procedure Date: 05/11/2021 MRN: [id number] Attending MD: Gerrit Heck , MD Date of Birth: 1965/05/18 CSN: [id number] Age: 56 Admit Type: Outpatient Procedure:                Colonoscopy Indications:              56 yo female with long-standing history of chronic,                            non-bloody diarrhea. She presents today for                            colonoscopy for evaluation along with routine colon                            cancer screening. This is the patient's first                            colonoscopy. Providers:                Gerrit Heck, MD, Dulcy Fanny, Luan Moore, Technician, Glenis Smoker, CRNA Referring MD:              Medicines:                Monitored Anesthesia Care Complications:            No immediate complications. Estimated Blood Loss:     Estimated blood loss was minimal. Procedure:                Pre-Anesthesia Assessment:                           - Prior to the procedure, a History and Physical                            was performed, and patient medications and                            allergies were reviewed. The patient's tolerance of                            previous anesthesia was also reviewed. The risks                            and benefits of the procedure and the sedation                            options and risks were discussed with the patient.                            All questions were answered, and informed consent                            was obtained.  Prior Anticoagulants: The patient has                            taken no previous anticoagulant or antiplatelet                            agents. ASA Grade Assessment: III - A patient with                            severe systemic disease. After reviewing the risks                            and benefits, the patient was deemed in                             satisfactory condition to undergo the procedure.                           After obtaining informed consent, the colonoscope                            was passed under direct vision. Throughout the                            procedure, the patient's blood pressure, pulse, and                            oxygen saturations were monitored continuously. The                            Colonoscope was introduced through the anus and                            advanced to the the terminal ileum. The colonoscopy                            was performed without difficulty. The patient                            tolerated the procedure well. The quality of the                            bowel preparation was good. The terminal ileum,                            ileocecal valve, appendiceal orifice, and rectum                            were photographed. Scope In: 9:03:25 AM Scope Out: 9:22:05 AM Scope Withdrawal Time: 0 hours 11 minutes 41 seconds  Total Procedure Duration: 0 hours 18 minutes 40 seconds  Findings:      Skin tags and grade 2 internal hemorrhoids were found on perianal exam.      The mucosa vascular pattern in the recto-sigmoid  colon and in the distal       sigmoid colon was decreased. Biopsies were taken with a cold forceps for       histology. Estimated blood loss was minimal.      The colon otherwise appeared normal throughout. Biopsies for histology       were taken with a cold forceps from the right colon and left colon for       evaluation of microscopic colitis. Estimated blood loss was minimal.      Non-bleeding internal hemorrhoids were found during retroflexion. The       hemorrhoids were small. 1 column was mildly inflammed and irritated       appearing.      The terminal ileum appeared normal. Impression:               - Perianal skin tags and grade 2 internal                            hemorrhoids found on perianal exam.                           - Decreased mucosa  vascular pattern in the                            recto-sigmoid colon and in the distal sigmoid                            colon. Biopsied.                           - The entire examined colon is normal. Biopsied.                           - Non-bleeding internal hemorrhoids.                           - The examined portion of the ileum was normal. Moderate Sedation:      Not Applicable - Patient had care per Anesthesia. Recommendation:           - Patient has a contact number available for                            emergencies. The signs and symptoms of potential                            delayed complications were discussed with the                            patient. Return to normal activities tomorrow.                            Written discharge instructions were provided to the                            patient.                           -  Resume previous diet.                           - Continue present medications.                           - Await pathology results.                           - Repeat colonoscopy in 10 years for screening                            purposes.                           - Return to GI office PRN.                           - Use fiber, for example Citrucel, Fibercon, Konsyl                            or Metamucil.                           - Use hydrocortisone suppository 25 mg 1 per rectum                            once a day for 5 days for inflammed internal                            hemorrhoid noted on this study. Procedure Code(s):        --- Professional ---                           [phone], Colonoscopy, flexible; with biopsy, single                            or multiple Diagnosis Code(s):        --- Professional ---                           K64.8, Other hemorrhoids                           K64.4, Residual hemorrhoidal skin tags                           K52.9, Noninfective gastroenteritis and colitis,                             unspecified CPT copyright 2019 American Medical Association. All rights reserved. The codes documented in this report are preliminary and upon coder review may  be revised to meet current compliance requirements. Gerrit Heck, MD 05/11/2021 9:31:28 AM Number of Addenda: 0

## 2021-05-12 ENCOUNTER — Encounter (HOSPITAL_COMMUNITY): Payer: Self-pay | Admitting: Gastroenterology

## 2021-05-15 LAB — SURGICAL PATHOLOGY

## 2021-05-16 NOTE — Progress Notes (Signed)
Yes, she is able to use the Anusol suppositories bid. I recommend not using for more than 10-14 days at a time though, as there can be mucosal breakdown with prolonged steroids. Thanks.

## 2021-05-18 ENCOUNTER — Other Ambulatory Visit: Payer: Self-pay | Admitting: Family Medicine

## 2021-05-18 NOTE — Progress Notes (Signed)
? ? ?  SUBJECTIVE:  ? ?CHIEF COMPLAINT / HPI:  ? ?Follow-up after colonoscopy: ?56 year old female presenting for follow-up after colonoscopy.  There were no polyps seen.  Biopsies were taken due to decreased mucosal vascular pattern in the rectosigmoid colon and the distal sigmoid colon.  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  It was further documented that the histologic changes of the focal active colitis most commonly are associated with resolving acute self-inflicted colitis but that certain drugs such as NSAIDs can elicit similar changes.  Today she states she continues to have some abdominal discomfort but that it has improved.  She states that she was taking NSAIDs due to her pain and has been doing so on occasion over the past few weeks. ? ?Hemorrhoids: ?Patient states she has had some trouble with hemorrhoids since her colonoscopy.  She states that she was given Anusol which did not seem to help.  She states that she has pain around bowel movements and some itching in the area.  She requested of the medication that she can use in the meantime. ? ?PERTINENT  PMH / PSH: Recent colonoscopy ? ?OBJECTIVE:  ? ?BP 105/74   Pulse 88   Wt 278 lb (126.1 kg)   LMP 04/05/2016   SpO2 99%   BMI 47.72 kg/m?   ? ?General: NAD, pleasant, able to participate in exam ?Cardiac: RRR, no murmurs. ?Respiratory: CTAB, normal effort, No wheezes, rales or rhonchi ?Abdomen: Bowel sounds present, soft, mild discomfort generally but no acute surgical findings, large central hernia present similar to last findings which does not appear incarcerated. ?Psych: Normal affect and mood ? ?ASSESSMENT/PLAN:  ? ?External hemorrhoids: ?We will prescribe Emla cream.  Patient tried Anusol after colonoscopy which did not seem to improve the  symptoms.  Discussed using fiber supplements and drinking plenty of water to have soft bowel movement daily. ? ?Abdominal discomfort ?Colonoscopy showed some focal active colitis with histologic changes that could suggest NSAIDs or other medications as a potential cause.  It was documented as being self-limited.  I discussed with the patient that I would reach out to the physician to see if they have any additional recommendations.  If we do not come up with any additional recommendations I can get her referred back to GI.  I did discuss with her that I would refill her tramadol again but that we need to find a way to scale back on this medication going forward ? ? ?Jackelyn Poling, DO ?[facility]  ? ? ? ?

## 2021-05-18 NOTE — Telephone Encounter (Signed)
Patient calls nurse line reporting difficulties picking up full Tramadol prescription send in on 2/21. ? ?I called the pharmacy and pharmacist reports only #10 was dispensed to her on 2/22. Pharmacist reports she believes there was a "glitch" in their system that caused the error.  ? ?Pharmacist reports #20 needs to be resent in due to being a controlled substance to fulfill the remaining prescription. ? ?Will forward to PCP.  ?

## 2021-05-19 ENCOUNTER — Ambulatory Visit (INDEPENDENT_AMBULATORY_CARE_PROVIDER_SITE_OTHER): Payer: Medicaid Other | Admitting: Family Medicine

## 2021-05-19 ENCOUNTER — Encounter: Payer: Self-pay | Admitting: Family Medicine

## 2021-05-19 ENCOUNTER — Other Ambulatory Visit: Payer: Self-pay

## 2021-05-19 VITALS — BP 105/74 | HR 88 | Wt 278.0 lb

## 2021-05-19 DIAGNOSIS — K644 Residual hemorrhoidal skin tags: Secondary | ICD-10-CM | POA: Diagnosis not present

## 2021-05-19 DIAGNOSIS — R109 Unspecified abdominal pain: Secondary | ICD-10-CM | POA: Diagnosis not present

## 2021-05-19 MED ORDER — LIDOCAINE-PRILOCAINE 2.5-2.5 % EX CREA: 1.0000 "application " | TOPICAL_CREAM | CUTANEOUS | 0 refills | Status: DC | PRN

## 2021-05-19 NOTE — Patient Instructions (Signed)
I prescribed a medication for the hemorrhoids.  Let me know if it does not help. ? ?I want you to avoid any Motrin, Aleve, ibuprofen, naproxen sodium, or similar NSAIDs for the next 2 months to see if it improves your symptoms.  It is okay to take Tylenol.  I have provided the refill of your tramadol but I do want [name] to scale back on that medication going forward and eventually discontinue it.  I want to try to reach out to the GI doctor for any additional recommendations and if we do not get any I may get you referred back to them.  You should hear from me about this in the next week. ?

## 2021-05-22 DIAGNOSIS — K439 Ventral hernia without obstruction or gangrene: Secondary | ICD-10-CM | POA: Diagnosis not present

## 2021-05-22 DIAGNOSIS — Z6841 Body Mass Index (BMI) 40.0 and over, adult: Secondary | ICD-10-CM | POA: Diagnosis not present

## 2021-05-25 ENCOUNTER — Telehealth: Payer: Self-pay

## 2021-05-25 DIAGNOSIS — K420 Umbilical hernia with obstruction, without gangrene: Secondary | ICD-10-CM | POA: Diagnosis not present

## 2021-05-25 DIAGNOSIS — K219 Gastro-esophageal reflux disease without esophagitis: Secondary | ICD-10-CM | POA: Diagnosis not present

## 2021-05-25 DIAGNOSIS — E1169 Type 2 diabetes mellitus with other specified complication: Secondary | ICD-10-CM | POA: Diagnosis not present

## 2021-05-25 NOTE — Telephone Encounter (Signed)
Follow up appointment scheduled with Azalee Course, PA on 06/07/21 at 1:55pm.  ? ?Patient agreeable and voiced understanding.  ? ?Address given to the patient and a message  was sent to billing to make sure we accept the patients insurance and to clarify if the patient has a copay.  ?

## 2021-05-25 NOTE — Telephone Encounter (Signed)
Primary Cardiologist:Rita Herbie [place], MD ? ?Chart reviewed as part of pre-operative protocol coverage. Because of Rita Bass's past medical history and time since last visit, he/she will require a follow-up visit in order to better assess preoperative cardiovascular risk. ? ?Pre-op covering staff: ?- Please schedule appointment and call patient to inform them. ?- Please contact requesting surgeon's office via preferred method (i.e, phone, fax) to inform them of need for appointment prior to surgery. ? ? ? ?Rita Aland, NP-C ? ?  ?05/25/2021, 3:25 PM ?[facility] ?[address] ?Office [phone] Fax [phone] ? ?

## 2021-05-25 NOTE — Progress Notes (Signed)
? ? ?  SUBJECTIVE:  ? ?CHIEF COMPLAINT / HPI:  ? ?Follow-up-surgery consult: ?Patient was evaluated by general surgery on 3/6 for large ventral hernia.  It was discussed that success rates for this repair would be lower due to her BMI of 50.  However, she had a second opinion on 3/9 who did recommend having a robotic or possibly open surgery due to the fact that the seem to be severely symptomatic issue.  He recommended her follow-up today for A1c check and got her scheduled with cardiology for surgical clearance. ? ?Diabetic Follow Up: ?Patient is a 56 y.o. female who present today for diabetic follow up.  ? ?Patient endorses no problems ? ?Home medications include: Victoza 1.8 mg ?Patient endorses taking these medications as prescribed. ? ?Most recent A1Cs:  ?Lab Results  ?Component Value Date  ? HGBA1C 6.2 05/26/2021  ? HGBA1C 6.4 02/28/2021  ? HGBA1C 6.8 10/04/2020  ? ?Last Microalbumin, LDL, Creatinine: ?Lab Results  ?Component Value Date  ? MICROALBUR 150 10/25/2017  ? LDLCALC 115 (H) 06/21/2020  ? CREATININE 0.65 04/07/2021  ? ? ? ?PERTINENT  PMH / PSH: Obesity ? ?OBJECTIVE:  ? ?BP 124/68   Pulse 86   Ht 5\' 4"  (1.626 m)   Wt 272 lb 8 oz (123.6 kg)   LMP 04/05/2016   SpO2 98%   BMI 46.77 kg/m?   ? ?General: NAD, pleasant, able to participate in exam ?Respiratory: No respiratory distress, clear to auscultation ?Cardiac: RRR, no murmur ?Abdomen: No pain with palpation ?Skin: warm and dry, no rashes noted ?Psych: Normal affect and mood  ? ?ASSESSMENT/PLAN:  ? ?  ?Preop: ?Your A1c today is 6.2.  Your blood pressure is also excellent today.  Please keep your appointment with cardiology for surgical clearance.  Let me know if you have any other questions or concerns. ? ?Type 2 diabetes: ?For diabetes your A1c is excellent.  Continue current treatments.  We should recheck this in 3 to 6 months. ? ?04/07/2016, DO ?[facility]  ? ? ? ?

## 2021-05-25 NOTE — Telephone Encounter (Signed)
SURGEONS NAME:  ?

## 2021-05-25 NOTE — Telephone Encounter (Signed)
Pt has aappt with Azalee Course, [facility] 06/07/21. Will forward notes to [facility] for upcoming appt. Will send FYI to requesting office pt has appt.  ?

## 2021-05-25 NOTE — Telephone Encounter (Signed)
? ?  Pre-operative Risk Assessment  ?  ?Patient Name: Rita Bass  ?DOB: November 29, 1965 ?MRN: [id number]  ? ?  ? ?Request for Surgical Clearance   ? ?Procedure:   Hernia repair ? ?Date of Surgery:  Clearance TBD                              ?   ?Surgeon:  Not listed ?Surgeon's Group or Practice Name:  [facility] ?Phone number:  [phone] ?Fax number:  [phone] ?  ?Type of Clearance Requested:   ?- Medical  ?  ?Type of Anesthesia:  General  ?  ?Additional requests/questions:  Please fax a copy of clearance to the surgeon's office. ? ?Signed, ?Jeb Levering Williette Loewe   ?05/25/2021, 3:17 PM  ? ?

## 2021-05-26 ENCOUNTER — Ambulatory Visit: Payer: Medicaid Other | Admitting: Family Medicine

## 2021-05-26 ENCOUNTER — Encounter: Payer: Self-pay | Admitting: Family Medicine

## 2021-05-26 ENCOUNTER — Other Ambulatory Visit: Payer: Self-pay

## 2021-05-26 VITALS — BP 124/68 | HR 86 | Ht 64.0 in | Wt 272.5 lb

## 2021-05-26 DIAGNOSIS — E118 Type 2 diabetes mellitus with unspecified complications: Secondary | ICD-10-CM

## 2021-05-26 LAB — POCT GLYCOSYLATED HEMOGLOBIN (HGB A1C): HbA1c, POC (controlled diabetic range): 6.2 % (ref 0.0–7.0)

## 2021-05-26 NOTE — Patient Instructions (Signed)
Your A1c is 6.2 today.  Please keep your follow-up with cardiology for surgical clearance.  Blood pressure is excellent today and I do not think we need to do anything different other than your visit with cardiology.  We should recheck your A1c in 3 to 6 months. ?

## 2021-06-05 ENCOUNTER — Other Ambulatory Visit: Payer: Self-pay

## 2021-06-05 MED ORDER — TRAMADOL HCL 50 MG PO TABS: ORAL_TABLET | ORAL | 0 refills | Status: DC

## 2021-06-05 NOTE — Telephone Encounter (Signed)
Patient calls nurse line requesting refill on Tramadol rx. Patient also wanted to make provider aware that she would not be able to have hernia surgery until 08/16/21. ? ?Will forward to PCP.  ? ?Talbot Grumbling, RN ? ?

## 2021-06-06 NOTE — Progress Notes (Signed)
? ? ?  SUBJECTIVE:  ? ?CHIEF COMPLAINT / HPI:  ? ?"Discuss upcoming surgery": ?She presents to discuss her upcoming surgery for abdominal hernia.  She was previously seen by her cardiologist for risk-stratification prior to the surgery.  Cardiologist gave her clearance from a cardiovascular standpoint.  She has surgery scheduled for 08/16/2021.  Today she states she has seen her cardiologist but wanted to have further discussion with me about the risk and benefits of the procedure.  She states this has been bothering her for a long time and she would like to improve her abdominal symptoms.  She has previously been seen by her surgeon who feels the abdominal symptoms likely are at least partly caused by the significant hernia that she has. ? ?PERTINENT  PMH / PSH: None relevant ? ?OBJECTIVE:  ? ?BP 112/61   Pulse 70   Ht 5\' 4"  (1.626 m)   Wt 275 lb (124.7 kg)   LMP 04/05/2016   SpO2 97%   BMI 47.20 kg/m?   ? ?General: NAD, pleasant, able to participate in exam ?Cardiac: RRR, no murmurs. ?Respiratory: CTAB, normal effort, No wheezes, rales or rhonchi ?Abdomen: Bowel sounds present, no pain with palpation, large ventral hernia noted similar to before ?Neuro: alert, no obvious focal deficits ?Psych: Normal affect and mood ? ?ASSESSMENT/PLAN:  ? ?Ventral hernia surgery-discussion prior to the surgery ?She has an upcoming laparoscopic hernia repair surgery.  Patient recently evaluated by her cardiologist for risk stratification prior to surgery, cardiology gave her clearance from a cardiovascular standpoint.  She has her surgery scheduled for 08/16/2021.  She presents mainly today to discuss risk and benefits of this procedure.  I had a prolonged discussion with her about risk and benefits of this and any procedure.  I explained that all procedures carry risk and that we cannot see the future on whether she will have a good outcome or resolution of her symptoms from this versus an outcome that is not as ideal, however  we have proceeded with things that improve the likelihood of her having a good outcome such as being evaluated by cardiology, controlling her A1c to below goal, etc.  She explained multiple times that she has been dealing with this for a long time, greater than 1 year, and would like to have resolution or improvement of her symptoms.  I further explained that we have done multiple modes of testing for her symptoms which have not found another cause or improve them including lab work, specialist evaluations, colonoscopy, etc.  She ultimately decided that she is planning to proceed with the surgery.  I informed her that she can always schedule follow-up with me if she wants to discuss further or has any other questions or concerns. ? ?08/18/2021, DO ?[facility]  ? ? ? ?

## 2021-06-07 ENCOUNTER — Other Ambulatory Visit: Payer: Self-pay

## 2021-06-07 ENCOUNTER — Encounter: Payer: Self-pay | Admitting: Physician Assistant

## 2021-06-07 ENCOUNTER — Ambulatory Visit: Payer: Medicaid Other | Admitting: Physician Assistant

## 2021-06-07 VITALS — BP 110/70 | HR 70 | Ht 64.0 in | Wt 278.0 lb

## 2021-06-07 DIAGNOSIS — Z01818 Encounter for other preprocedural examination: Secondary | ICD-10-CM

## 2021-06-07 DIAGNOSIS — J449 Chronic obstructive pulmonary disease, unspecified: Secondary | ICD-10-CM

## 2021-06-07 DIAGNOSIS — E119 Type 2 diabetes mellitus without complications: Secondary | ICD-10-CM

## 2021-06-07 DIAGNOSIS — R0609 Other forms of dyspnea: Secondary | ICD-10-CM | POA: Diagnosis not present
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
## 2021-06-07 NOTE — Patient Instructions (Signed)
Medication Instructions:  ?No Changes ?*If you need a refill on your cardiac medications before your next appointment, please call your pharmacy* ? ? ?Lab Work: ?No Labs ?If you have labs (blood work) drawn today and your tests are completely normal, you will receive your results only by: ?MyChart Message (if you have MyChart) OR ?A paper copy in the mail ?If you have any lab test that is abnormal or we need to change your treatment, we will call you to review the results. ? ? ?Testing/Procedures: ?No Testing ? ? ?Follow-Up: ?At [facility], you and your health needs are our priority.  As part of our continuing mission to provide you with exceptional heart care, we have created designated Provider Care Teams.  These Care Teams include your primary Cardiologist (physician) and Advanced Practice Providers (APPs -  Physician Assistants and Nurse Practitioners) who all work together to provide you with the care you need, when you need it. ? ?We recommend signing up for the patient portal called "MyChart".  Sign up information is provided on this After Visit Summary.  MyChart is used to connect with patients for Virtual Visits (Telemedicine).  Patients are able to view lab/test results, encounter notes, upcoming appointments, etc.  Non-urgent messages can be sent to your provider as well.   ?To learn more about what you can do with MyChart, go to [email or web address].   ? ?Your next appointment:   ?As Needed ? ?The format for your next appointment:   ?In Person ? ?Provider:   ?Bryan Lemma, MD   ? ? ?  ?

## 2021-06-09 ENCOUNTER — Encounter: Payer: Self-pay | Admitting: Family Medicine

## 2021-06-09 ENCOUNTER — Other Ambulatory Visit: Payer: Self-pay

## 2021-06-09 ENCOUNTER — Ambulatory Visit: Payer: Medicaid Other | Admitting: Family Medicine

## 2021-06-09 VITALS — BP 112/61 | HR 70 | Ht 64.0 in | Wt 275.0 lb

## 2021-06-09 DIAGNOSIS — K439 Ventral hernia without obstruction or gangrene: Secondary | ICD-10-CM | POA: Diagnosis not present

## 2021-06-15 ENCOUNTER — Other Ambulatory Visit: Payer: Self-pay | Admitting: Family Medicine

## 2021-06-16 ENCOUNTER — Other Ambulatory Visit: Payer: Self-pay | Admitting: Family Medicine

## 2021-06-16 ENCOUNTER — Telehealth: Payer: Self-pay

## 2021-06-16 MED ORDER — ONDANSETRON HCL 4 MG PO TABS
4.0000 mg | ORAL_TABLET | Freq: Three times a day (TID) | ORAL | 0 refills | Status: DC | PRN
Start: 2021-06-16 — End: 2023-05-02

## 2021-06-16 NOTE — Telephone Encounter (Signed)
Patient calls nurse line requesting a refill on Zofran.  ? ?I do not see this on patients medication list. Patient reports she has had this medication before and PCP is aware of stomach issues.  ? ?Patient reports she is having surgery on 5/31 to hopefully resolve issues.  ? ?Will forward to PCP.  ?

## 2021-07-17 ENCOUNTER — Other Ambulatory Visit: Payer: Self-pay

## 2021-07-17 MED ORDER — TRAMADOL HCL 50 MG PO TABS: ORAL_TABLET | ORAL | 0 refills | Status: DC

## 2021-07-20 NOTE — Progress Notes (Deleted)
    SUBJECTIVE:   CHIEF COMPLAINT / HPI:   Presurgical risk stratification-robotic assisted hernia repair with mesh: 56 year old female present for the above. She previously saw cardiology on 06/07/2021 for preoperative cardiovascular clearance for robotic assisted hernia repair which is scheduled for 08/16/2021.  She did receive clearance from a cardiovascular perspective.  Her most recent A1c is 6.2 on 05/26/2021 and it is appropriate.  PERTINENT  PMH / PSH: ***  OBJECTIVE:   LMP 04/05/2016  ***  General: NAD, pleasant, able to participate in exam Cardiac: RRR, no murmurs. Respiratory: CTAB, normal effort, No wheezes, rales or rhonchi Abdomen: Bowel sounds present, nontender, nondistended, no hepatosplenomegaly. Extremities: no edema or cyanosis. Skin: warm and dry, no rashes noted Neuro: alert, no obvious focal deficits Psych: Normal affect and mood  ASSESSMENT/PLAN:   No problem-specific Assessment & Plan notes found for this encounter.     Jackelyn Poling, DO [facility]    {    This will disappear when note is signed, click to select method of visit    :1}

## 2021-07-24 ENCOUNTER — Ambulatory Visit: Payer: Medicaid Other | Admitting: Family Medicine

## 2021-07-25 ENCOUNTER — Ambulatory Visit: Payer: Medicaid Other | Admitting: Family Medicine

## 2021-07-25 ENCOUNTER — Encounter: Payer: Self-pay | Admitting: Family Medicine

## 2021-07-25 VITALS — BP 124/72 | HR 90 | Ht 64.0 in | Wt 269.5 lb

## 2021-07-25 DIAGNOSIS — J302 Other seasonal allergic rhinitis: Secondary | ICD-10-CM | POA: Diagnosis not present

## 2021-07-25 DIAGNOSIS — H5789 Other specified disorders of eye and adnexa: Secondary | ICD-10-CM

## 2021-07-25 MED ORDER — FLUTICASONE PROPIONATE 50 MCG/ACT NA SUSP: 2.0000 | Freq: Every day | NASAL | 6 refills | Status: DC

## 2021-07-25 NOTE — Patient Instructions (Addendum)
For your eye irritation I think this is most likely due to seasonal allergies.  I am prescribing Flonase nasal spray.  I want you to do 2 sprays in each nose for the next 1 to 2 weeks.  You can continue using this if it is working well. ? ?If your symptoms or not improving you can also consider adding a Zyrtec 10 mg tablet each day. ? ?If you develop any loss of vision, change in vision, worsening eye pain, worsening drainage from your eyes, or other symptoms please seek help.  If your symptoms do not improve over the next 4 to 5 days please return. ? ?Can use an over-the-counter moisturizing and medicated eyedrop as well.  I recommend frequent handwashing in case this is a viral pinkeye as these can be very contagious but there is not a set treatment for this and your body will fight it off over the next few days if this is the case. ?

## 2021-07-25 NOTE — Progress Notes (Signed)
? ? ?  SUBJECTIVE:  ? ?CHIEF COMPLAINT / HPI:  ? ?Eye irritation: ?Red itchy eyes that started two days ago. She has had some crusting when waking and sometimes during the day. No difficulty with vision. She states she has never had any issues with allergies and so doesn't take any allergy medications. She notes no one else in the family has had similar symptoms. ? ?PERTINENT  PMH / PSH: None relevent. ? ?OBJECTIVE:  ? ?BP 124/72   Pulse 90   Ht 5\' 4"  (1.626 m)   Wt 269 lb 8 oz (122.2 kg)   LMP 04/05/2016   SpO2 97%   BMI 46.26 kg/m?   ? ?General: NAD, pleasant, able to participate in exam ?HEENT: Some redness present of both eyes, no crusting or drainage noted, no photophobia on physical exam.  No pain with extraocular movement.  Minimal nasal congestion present. ?Respiratory: No respiratory distress on room air ?Neuro: alert, no obvious focal deficits ?Psych: Normal affect and mood ? ?ASSESSMENT/PLAN:  ? ?Eye irritation: ?Differential can include viral pinkeye versus seasonal allergies.  The patient does not have a history of seasonal allergies but this is a particularly bad allergy year.  Her eye symptoms are bilateral, there is no change in vision, and her symptoms are mostly dry itchy eyes which can be either viral pinkeye or seasonal allergies.  Because is not a good treatment for viral pinkeye we will treat for seasonal allergies.  I have low suspicion for bacterial pinkeye.  No one else in the family has similar symptoms which suggest again that this may be seasonal allergies.  Will treat with Flonase nasal spray for seasonal allergies, she is going to do this for the next 4 or so days, if it does not improve her symptoms she can consider adding a Zyrtec on top of this.  Mended over-the-counter nonmedicated moisturizing eyedrops and frequent handwashing in case this is a viral pinkeye.  She is going to follow-up if she develops any worsening symptoms, fevers, purulence draining from one eye, or any  other concerns.  She has any loss of vision or change in vision she is going to follow-up immediately. ? ?04/07/2016, DO ?[facility]  ? ? ? ?

## 2021-07-27 ENCOUNTER — Ambulatory Visit: Payer: Self-pay | Admitting: Surgery

## 2021-07-27 NOTE — Progress Notes (Signed)
Sent message, via epic in basket, requesting order in epic from surgeon  ? ? ? 07/27/21 0926  ?Preop Orders  ?Has preop orders? No  ?Name of staff/physician contacted for orders(Indicate phone or IB message) Dr. Dossie Der  ? ? ?

## 2021-07-28 ENCOUNTER — Encounter: Payer: Self-pay | Admitting: Family Medicine

## 2021-07-28 ENCOUNTER — Ambulatory Visit (INDEPENDENT_AMBULATORY_CARE_PROVIDER_SITE_OTHER): Payer: Medicaid Other | Admitting: Family Medicine

## 2021-07-28 DIAGNOSIS — H1013 Acute atopic conjunctivitis, bilateral: Secondary | ICD-10-CM | POA: Diagnosis not present

## 2021-07-28 DIAGNOSIS — H1010 Acute atopic conjunctivitis, unspecified eye: Secondary | ICD-10-CM | POA: Insufficient documentation

## 2021-07-28 MED ORDER — OLOPATADINE HCL 0.1 % OP SOLN: 1.0000 [drp] | Freq: Two times a day (BID) | OPHTHALMIC | 12 refills | Status: AC

## 2021-07-28 MED ORDER — CETIRIZINE HCL 10 MG PO TABS: 10.0000 mg | ORAL_TABLET | Freq: Every day | ORAL | 11 refills | Status: DC

## 2021-07-28 NOTE — Progress Notes (Signed)
? ? ?  SUBJECTIVE:  ? ?CHIEF COMPLAINT / HPI:  ? ?Eye Redness, Irritation ?Was last seen on 5/9 by Dr. Edwina Barth.  Complaining about red itchy eyes with some intermittent crusting when waking up.  There was minimal nasal congestion present on examination and eyes without any crusting or drainage noted.  She was treated for seasonal allergies with Flonase, low suspicion for bacterial pinkeye at that time.  Recommended to return if no improvement with recommendation to consider adding Zyrtec. ? ?Patient states that her symptoms have not improved.  She has been using the Flonase daily.  She has been using Visine eyedrops as well.  She still feels that her eyes are irritated and dry.  They are still itchy.  She has a little bit of light sensitivity when looking at the sun, but otherwise light does not bother her.  She does not wear contacts.  Eyes are matted only in the morning but no drainage throughout the day.  ? ?PERTINENT  PMH / PSH:  ?Past Medical History:  ?Diagnosis Date  ? Arthritis   ? Asthma   ? Chronic pain of left knee   ? Complication of anesthesia   ? PONV  ? Diabetes ([place])   ? GERD (gastroesophageal reflux disease)   ? H/O bronchitis   ? Kidney stones   ? Obesity   ? Renal disorder   ? Shortness of breath dyspnea   ? Sleep apnea   ? past issues - at weight over 400lbs  ? Sleep apnea in adult   ? Polysomnogram pending.  Followed by Dr. Halford Chessman  ? ?OBJECTIVE:  ? ?BP 110/83   Pulse 82   Ht 5\' 4"  (1.626 m)   Wt 267 lb (121.1 kg)   LMP 04/05/2016   SpO2 98%   BMI 45.83 kg/m?   ? ?General: NAD, pleasant, able to participate in exam ?HEENT: Normocephalic, b/l conjunctivitis without drainage   ?Psych: Normal affect and mood ? ?ASSESSMENT/PLAN:  ? ?Allergic conjunctivitis ?Eyes are still erythematous.  With symptoms of itchiness and irritation, still favor allergic conjunctivitis over bacterial conjunctivitis.  There is no drainage throughout the day although eyes are matted in the morning.  Also given that it  is bilateral.  Can continue Flonase.  Will start on Zyrtec as well as antihistamine eyedrops to help with symptomatic relief.  Patient to return if no improvement or if worsening symptoms or changes to her vision. ?  ? ? ?Sharion Settler, DO ?[place]  ? ?

## 2021-07-28 NOTE — Patient Instructions (Addendum)
It was wonderful to see you today. ? ?Today we talked about: ? ?-I am sending antihistamine eyedrops that you can use to both eyes to help with your symptoms.  I do still believe that this is related to allergies.  I have also sent a prescription for oral allergy medication to help with your symptoms ?-Return if not improving or worsening. ?-Return if any changes to your vision. ? ? ?Thank you for choosing [facility].  ? ?Please call [phone] with any questions about today's appointment. ? ?Please be sure to schedule follow up at the front  desk before you leave today.  ? ?Sabino Dick, DO ?PGY-2 Family Medicine   ?

## 2021-07-28 NOTE — Assessment & Plan Note (Signed)
Eyes are still erythematous.  With symptoms of itchiness and irritation, still favor allergic conjunctivitis over bacterial conjunctivitis.  There is no drainage throughout the day although eyes are matted in the morning.  Also given that it is bilateral.  Can continue Flonase.  Will start on Zyrtec as well as antihistamine eyedrops to help with symptomatic relief.  Patient to return if no improvement or if worsening symptoms or changes to her vision. ?

## 2021-08-02 NOTE — Patient Instructions (Addendum)
DUE TO COVID-19 ONLY TWO VISITORS  (aged 54 and older)  ARE ALLOWED TO COME WITH YOU AND STAY IN THE WAITING ROOM ONLY DURING PRE OP AND PROCEDURE.   ?**NO VISITORS ARE ALLOWED IN THE SHORT STAY AREA OR RECOVERY ROOM!!** ? ?IF YOU WILL BE ADMITTED INTO THE HOSPITAL YOU ARE ALLOWED ONLY FOUR SUPPORT PEOPLE DURING VISITATION HOURS ONLY (7 AM -8PM)   ?The support person(s) must pass our screening, gel in and out, and wear a mask at all times, including in the patient?s room. ?Patients must also wear a mask when staff or their support person are in the room. ?Visitors GUEST BADGE MUST BE WORN VISIBLY  ?One adult visitor may remain with you overnight and MUST be in the room by 8 P.M. ?  ? ? Your procedure is scheduled on: 08/16/21 ? ? [facility] ? ?  Report to admitting at : 6:15 AM ? ? Call this number if you have problems the morning of surgery [phone] ? ?Eat a light diet the day before surgery.  Examples including soups, broths, toast, yogurt, mashed potatoes.  Things to avoid include carbonated beverages (fizzy beverages), raw fruits and raw vegetables, or beans.  ? ?If your bowels are filled with gas, your surgeon will have difficulty visualizing your pelvic organs which increases your surgical risks.   ? ?Do not eat food :After Midnight. ? ? After Midnight you may have the following liquids until: 5:30 AM DAY OF SURGERY ? ?Water ?Black Coffee (sugar ok, NO MILK/CREAM OR CREAMERS)  ?Tea (sugar ok, NO MILK/CREAM OR CREAMERS) regular and decaf                             ?Plain Jell-O (NO RED)                                           ?Fruit ices (not with fruit pulp, NO RED)                                     ?Popsicles (NO RED)                                                                  ?Juice: apple, WHITE grape, WHITE cranberry ?Sports drinks like Gatorade (NO RED) ?Clear broth(vegetable,chicken,beef) ? ?FOLLOW BOWEL PREP AND ANY ADDITIONAL PRE OP INSTRUCTIONS YOU  RECEIVED FROM YOUR SURGEON'S OFFICE!!! ?  ?Oral Hygiene is also important to reduce your risk of infection.                                    ?Remember - BRUSH YOUR TEETH THE MORNING OF SURGERY WITH YOUR REGULAR TOOTHPASTE ? ? Do NOT smoke after Midnight ? ? Take these medicines the morning of surgery with A SIP OF WATER: Omeprazole.Tylenol and cetirizine as needed. ?How to Manage Your Diabetes ?Before and After Surgery ? ?Why is it important to control my  blood sugar before and after surgery? ?Improving blood sugar levels before and after surgery helps healing and can limit problems. ?A way of improving blood sugar control is eating a healthy diet by: ? Eating less sugar and carbohydrates ? Increasing activity/exercise ? Talking with your doctor about reaching your blood sugar goals ?High blood sugars (greater than 180 mg/dL) can raise your risk of infections and slow your recovery, so you will need to focus on controlling your diabetes during the weeks before surgery. ?Make sure that the doctor who takes care of your diabetes knows about your planned surgery including the date and location. ? ?How do I manage my blood sugar before surgery? ?Check your blood sugar at least 4 times a day, starting 2 days before surgery, to make sure that the level is not too high or low. ?Check your blood sugar the morning of your surgery when you wake up and every 2 hours until you get to the Short Stay unit. ?If your blood sugar is less than 70 mg/dL, you will need to treat for low blood sugar: ?Do not take insulin. ?Treat a low blood sugar (less than 70 mg/dL) with ? cup of clear juice (cranberry or apple), 4 glucose tablets, OR glucose gel. ?Recheck blood sugar in 15 minutes after treatment (to make sure it is greater than 70 mg/dL). If your blood sugar is not greater than 70 mg/dL on recheck, call [phone] for further instructions. ?Report your blood sugar to the short stay nurse when you get to Short Stay. ? ?If you are  admitted to the hospital after surgery: ?Your blood sugar will be checked by the staff and you will probably be given insulin after surgery (instead of oral diabetes medicines) to make sure you have good blood sugar levels. ?The goal for blood sugar control after surgery is 80-180 mg/dL. ? ? ?WHAT DO I DO ABOUT MY DIABETES MEDICATION? ? ?Do not take oral diabetes medicines (pills) the morning of surgery. ? ?THE DAY BEFORE SURGERY, take victosa as usual.     ? ?THE MORNING OF SURGERY, DO NOT take victosa. ? ?DO NOT TAKE ANY ORAL DIABETIC MEDICATIONS DAY OF YOUR SURGERY ? ?Bring CPAP mask and tubing day of surgery. ?                  ?           You may not have any metal on your body including hair pins, jewelry, and body piercing ? ?           Do not wear make-up, lotions, powders, perfumes/cologne, or deodorant ? ?Do not wear nail polish including gel and S&S, artificial/acrylic nails, or any other type of covering on natural nails including finger and toenails. If you have artificial nails, gel coating, etc. that needs to be removed by a nail salon please have this removed prior to surgery or surgery may need to be canceled/ delayed if the surgeon/ anesthesia feels like they are unable to be safely monitored.  ? ?Do not shave  48 hours prior to surgery.  ? ? Do not bring valuables to the hospital. [facility] IS NOT ?            RESPONSIBLE   FOR VALUABLES. ? ? Contacts, dentures or bridgework may not be worn into surgery. ? ? Bring small overnight bag day of surgery. ?  ? Patients discharged on the day of surgery will not be allowed to drive home.  Someone  NEEDS to stay with you for the first 24 hours after anesthesia. ? ? Special Instructions: Bring a copy of your healthcare power of attorney and living will documents         the day of surgery if you haven't scanned them before. ? ?            Please read over the following fact sheets you were given: IF YOU HAVE QUESTIONS ABOUT YOUR PRE-OP INSTRUCTIONS PLEASE  CALL [phone] ? ?   [facility] - Preparing for Surgery ?Before surgery, you can play an important role.  Because skin is not sterile, your skin needs to be as free of germs as possible.  You can reduce the number of germs on your skin by washing with CHG (chlorahexidine gluconate) soap before surgery.  CHG is an antiseptic cleaner which kills germs and bonds with the skin to continue killing germs even after washing. ?Please DO NOT use if you have an allergy to CHG or antibacterial soaps.  If your skin becomes reddened/irritated stop using the CHG and inform your nurse when you arrive at Short Stay. ?Do not shave (including legs and underarms) for at least 48 hours prior to the first CHG shower.  You may shave your face/neck. ?Please follow these instructions carefully: ? 1.  Shower with CHG Soap the night before surgery and the  morning of Surgery. ? 2.  If you choose to wash your hair, wash your hair first as usual with your  normal  shampoo. ? 3.  After you shampoo, rinse your hair and body thoroughly to remove the  shampoo.                           4.  Use CHG as you would any other liquid soap.  You can apply chg directly  to the skin and wash  ?                     Gently with a scrungie or clean washcloth. ? 5.  Apply the CHG Soap to your body ONLY FROM THE NECK DOWN.   Do not use on face/ open      ?                     Wound or open sores. Avoid contact with eyes, ears mouth and genitals (private parts).  ?                     Engineering geologist,  Genitals (private parts) with your normal soap. ?            6.  Wash thoroughly, paying special attention to the area where your surgery  will be performed. ? 7.  Thoroughly rinse your body with warm water from the neck down. ? 8.  DO NOT shower/wash with your normal soap after using and rinsing off  the CHG Soap. ?               9.  Pat yourself dry with a clean towel. ?           10.  Wear clean pajamas. ?           11.  Place clean sheets on your bed the night of  your first shower and do not  sleep with pets. ?Day of Surgery : ?Do not apply any lotions/deodorants the morning of surgery.  Please  wear clean clothes to the hospital/surgery center. ? ?FAILURE TO FOLLOW

## 2021-08-03 ENCOUNTER — Encounter (HOSPITAL_COMMUNITY)
Admission: RE | Admit: 2021-08-03 | Discharge: 2021-08-03 | Disposition: A | Discharge status: Home / Self Care | Payer: Medicaid Other | Source: Ambulatory Visit | Attending: Surgery | Admitting: Surgery

## 2021-08-03 ENCOUNTER — Encounter (HOSPITAL_COMMUNITY): Payer: Self-pay

## 2021-08-03 VITALS — BP 128/78 | HR 70 | Temp 97.7°F | Ht 64.0 in | Wt 265.0 lb

## 2021-08-03 DIAGNOSIS — E1121 Type 2 diabetes mellitus with diabetic nephropathy: Secondary | ICD-10-CM | POA: Insufficient documentation

## 2021-08-03 DIAGNOSIS — Z01812 Encounter for preprocedural laboratory examination: Secondary | ICD-10-CM | POA: Insufficient documentation

## 2021-08-03 DIAGNOSIS — I10 Essential (primary) hypertension: Secondary | ICD-10-CM | POA: Insufficient documentation

## 2021-08-03 LAB — CBC
HCT: 42.9 % (ref 36.0–46.0)
Hemoglobin: 13.9 g/dL (ref 12.0–15.0)
MCH: 25.5 pg — ABNORMAL LOW (ref 26.0–34.0)
MCHC: 32.4 g/dL (ref 30.0–36.0)
MCV: 78.6 fL — ABNORMAL LOW (ref 80.0–100.0)
Platelets: 281 10*3/uL (ref 150–400)
RBC: 5.46 MIL/uL — ABNORMAL HIGH (ref 3.87–5.11)
RDW: 15.7 % — ABNORMAL HIGH (ref 11.5–15.5)
WBC: 6.4 10*3/uL (ref 4.0–10.5)
nRBC: 0 % (ref 0.0–0.2)

## 2021-08-03 LAB — BASIC METABOLIC PANEL
Anion gap: 7 (ref 5–15)
BUN: 13 mg/dL (ref 6–20)
CO2: 29 mmol/L (ref 22–32)
Calcium: 9.2 mg/dL (ref 8.9–10.3)
Chloride: 102 mmol/L (ref 98–111)
Creatinine, Ser: 0.56 mg/dL (ref 0.44–1.00)
GFR, Estimated: >60 mL/min (ref >60)
Glucose, Bld: 117 mg/dL — ABNORMAL HIGH (ref 70–99)
Potassium: 3.9 mmol/L (ref 3.5–5.1)
Sodium: 138 mmol/L (ref 135–145)

## 2021-08-03 LAB — GLUCOSE, CAPILLARY: Glucose-Capillary: 117 mg/dL — ABNORMAL HIGH (ref 70–99)

## 2021-08-03 NOTE — Progress Notes (Addendum)
For Short Stay: COVID SWAB appointment date: Date of COVID positive in last 90 days:  Bowel Prep reminder:   For Anesthesia: PCP - DO: Jackelyn Poling Cardiologist - Dr. Bryan Lemma. Clearance: [place]e Course: PA: 06/07/21  Chest x-ray -  EKG - 06/07/21 Stress Test -  ECHO - 10/15/17 Cardiac Cath -  Pacemaker/ICD device last checked: Pacemaker orders received: Device Rep notified:  Spinal Cord Stimulator:  Sleep Study - Yes CPAP - NO  Fasting Blood Sugar -  Checks Blood Sugar __1___ times a day Date and result of last Hgb A1c-05/26/21: 6.2  Blood Thinner Instructions: Aspirin Instructions: Last Dose:  Activity level: Can go up a flight of stairs and activities of daily living without stopping and without chest pain and/or shortness of breath   Able to exercise without chest pain and/or shortness of breath   Unable to go up a flight of stairs without chest pain and/or shortness of breath     Anesthesia review: Hx: DIA,OSA,COPD  Patient denies shortness of breath, fever, cough and chest pain at PAT appointment   Patient verbalized understanding of instructions that were given to them at the PAT appointment. Patient was also instructed that they will need to review over the PAT instructions again at home before surgery.

## 2021-08-10 ENCOUNTER — Other Ambulatory Visit: Payer: Self-pay | Admitting: Family Medicine

## 2021-08-15 ENCOUNTER — Other Ambulatory Visit: Payer: Self-pay | Admitting: Family Medicine

## 2021-08-16 ENCOUNTER — Inpatient Hospital Stay (HOSPITAL_COMMUNITY)
Admission: RE | Admit: 2021-08-16 | Discharge: 2021-08-21 | DRG: 354 | Disposition: A | Discharge status: Home / Self Care | Payer: Medicaid Other | Attending: Surgery | Admitting: Surgery

## 2021-08-16 ENCOUNTER — Other Ambulatory Visit: Payer: Self-pay

## 2021-08-16 ENCOUNTER — Encounter (HOSPITAL_COMMUNITY)
Admission: RE | Disposition: A | Discharge status: Home / Self Care | Payer: Self-pay | Source: Home / Self Care | Attending: Surgery

## 2021-08-16 ENCOUNTER — Encounter (HOSPITAL_COMMUNITY): Payer: Self-pay | Admitting: Surgery

## 2021-08-16 ENCOUNTER — Ambulatory Visit (HOSPITAL_COMMUNITY): Payer: Medicaid Other | Admitting: Certified Registered Nurse Anesthetist

## 2021-08-16 ENCOUNTER — Ambulatory Visit (HOSPITAL_BASED_OUTPATIENT_CLINIC_OR_DEPARTMENT_OTHER): Payer: Medicaid Other | Admitting: Certified Registered Nurse Anesthetist

## 2021-08-16 DIAGNOSIS — E1165 Type 2 diabetes mellitus with hyperglycemia: Secondary | ICD-10-CM | POA: Diagnosis present

## 2021-08-16 DIAGNOSIS — Z833 Family history of diabetes mellitus: Secondary | ICD-10-CM

## 2021-08-16 DIAGNOSIS — Z6841 Body Mass Index (BMI) 40.0 and over, adult: Secondary | ICD-10-CM | POA: Diagnosis not present

## 2021-08-16 DIAGNOSIS — I10 Essential (primary) hypertension: Secondary | ICD-10-CM | POA: Diagnosis present

## 2021-08-16 DIAGNOSIS — E785 Hyperlipidemia, unspecified: Secondary | ICD-10-CM | POA: Diagnosis not present

## 2021-08-16 DIAGNOSIS — G4733 Obstructive sleep apnea (adult) (pediatric): Secondary | ICD-10-CM | POA: Diagnosis present

## 2021-08-16 DIAGNOSIS — K429 Umbilical hernia without obstruction or gangrene: Secondary | ICD-10-CM

## 2021-08-16 DIAGNOSIS — K420 Umbilical hernia with obstruction, without gangrene: Principal | ICD-10-CM | POA: Diagnosis present

## 2021-08-16 DIAGNOSIS — Z79899 Other long term (current) drug therapy: Secondary | ICD-10-CM

## 2021-08-16 DIAGNOSIS — K219 Gastro-esophageal reflux disease without esophagitis: Secondary | ICD-10-CM | POA: Diagnosis present

## 2021-08-16 DIAGNOSIS — D62 Acute posthemorrhagic anemia: Secondary | ICD-10-CM | POA: Diagnosis not present

## 2021-08-16 DIAGNOSIS — E119 Type 2 diabetes mellitus without complications: Secondary | ICD-10-CM

## 2021-08-16 DIAGNOSIS — J45909 Unspecified asthma, uncomplicated: Secondary | ICD-10-CM | POA: Diagnosis present

## 2021-08-16 DIAGNOSIS — Z8249 Family history of ischemic heart disease and other diseases of the circulatory system: Secondary | ICD-10-CM

## 2021-08-16 DIAGNOSIS — N289 Disorder of kidney and ureter, unspecified: Secondary | ICD-10-CM

## 2021-08-16 DIAGNOSIS — K432 Incisional hernia without obstruction or gangrene: Secondary | ICD-10-CM | POA: Diagnosis not present

## 2021-08-16 DIAGNOSIS — E1169 Type 2 diabetes mellitus with other specified complication: Secondary | ICD-10-CM | POA: Diagnosis present

## 2021-08-16 DIAGNOSIS — Z794 Long term (current) use of insulin: Secondary | ICD-10-CM

## 2021-08-16 DIAGNOSIS — K439 Ventral hernia without obstruction or gangrene: Principal | ICD-10-CM

## 2021-08-16 LAB — HEMOGLOBIN A1C
Hgb A1c MFr Bld: 6.2 % — ABNORMAL HIGH (ref 4.8–5.6)
Mean Plasma Glucose: 131.24 mg/dL

## 2021-08-16 LAB — GLUCOSE, CAPILLARY
Glucose-Capillary: 135 mg/dL — ABNORMAL HIGH (ref 70–99)
Glucose-Capillary: 162 mg/dL — ABNORMAL HIGH (ref 70–99)
Glucose-Capillary: 171 mg/dL — ABNORMAL HIGH (ref 70–99)
Glucose-Capillary: 244 mg/dL — ABNORMAL HIGH (ref 70–99)

## 2021-08-16 LAB — CREATININE, SERUM
Creatinine, Ser: 0.71 mg/dL (ref 0.44–1.00)
GFR, Estimated: >60 mL/min (ref >60)

## 2021-08-16 LAB — CBC
HCT: 39.8 % (ref 36.0–46.0)
Hemoglobin: 12.5 g/dL (ref 12.0–15.0)
MCH: 25 pg — ABNORMAL LOW (ref 26.0–34.0)
MCHC: 31.4 g/dL (ref 30.0–36.0)
MCV: 79.6 fL — ABNORMAL LOW (ref 80.0–100.0)
Platelets: 274 10*3/uL (ref 150–400)
RBC: 5 MIL/uL (ref 3.87–5.11)
RDW: 15.9 % — ABNORMAL HIGH (ref 11.5–15.5)
WBC: 14.3 10*3/uL — ABNORMAL HIGH (ref 4.0–10.5)
nRBC: 0 % (ref 0.0–0.2)

## 2021-08-16 SURGERY — REPAIR, HERNIA, UMBILICAL, ROBOT-ASSISTED
Anesthesia: General

## 2021-08-16 MED ORDER — LORATADINE 10 MG PO TABS: 10.0000 mg | ORAL_TABLET | Freq: Every day | ORAL | Status: DC | PRN

## 2021-08-16 MED ORDER — GABAPENTIN 300 MG PO CAPS
300.0000 mg | ORAL_CAPSULE | ORAL | Status: AC
  Administered 2021-08-16: 300 mg via ORAL
  Filled 2021-08-16: qty 1

## 2021-08-16 MED ORDER — 0.9 % SODIUM CHLORIDE (POUR BTL) OPTIME
TOPICAL | Status: DC | PRN
  Administered 2021-08-16: 1000 mL

## 2021-08-16 MED ORDER — ROCURONIUM BROMIDE 10 MG/ML (PF) SYRINGE
PREFILLED_SYRINGE | INTRAVENOUS | Status: DC | PRN
  Administered 2021-08-16: 30 mg via INTRAVENOUS
  Administered 2021-08-16: 50 mg via INTRAVENOUS
  Administered 2021-08-16: 20 mg via INTRAVENOUS

## 2021-08-16 MED ORDER — ROCURONIUM BROMIDE 10 MG/ML (PF) SYRINGE
PREFILLED_SYRINGE | INTRAVENOUS | Status: AC
  Filled 2021-08-16: qty 10

## 2021-08-16 MED ORDER — FENTANYL CITRATE (PF) 100 MCG/2ML IJ SOLN
INTRAMUSCULAR | Status: DC | PRN
Start: 2021-08-16 — End: 2021-08-16
  Administered 2021-08-16: 50 ug via INTRAVENOUS
  Administered 2021-08-16: 100 ug via INTRAVENOUS
  Administered 2021-08-16 (×4): 50 ug via INTRAVENOUS

## 2021-08-16 MED ORDER — PROPOFOL 10 MG/ML IV BOLUS
INTRAVENOUS | Status: AC
  Filled 2021-08-16: qty 20

## 2021-08-16 MED ORDER — ORAL CARE MOUTH RINSE: 15.0000 mL | Freq: Once | OROMUCOSAL | Status: AC

## 2021-08-16 MED ORDER — SUGAMMADEX SODIUM 500 MG/5ML IV SOLN
INTRAVENOUS | Status: DC | PRN
  Administered 2021-08-16: 500 mg via INTRAVENOUS

## 2021-08-16 MED ORDER — BUPIVACAINE-EPINEPHRINE 0.25% -1:200000 IJ SOLN
INTRAMUSCULAR | Status: DC | PRN
  Administered 2021-08-16: 30 mL

## 2021-08-16 MED ORDER — GABAPENTIN 600 MG PO TABS
300.0000 mg | ORAL_TABLET | Freq: Three times a day (TID) | ORAL | Status: DC
  Filled 2021-08-16: qty 0.5

## 2021-08-16 MED ORDER — DOCUSATE SODIUM 100 MG PO CAPS
100.0000 mg | ORAL_CAPSULE | Freq: Two times a day (BID) | ORAL | Status: DC
  Administered 2021-08-16 – 2021-08-21 (×10): 100 mg via ORAL
  Filled 2021-08-16 (×11): qty 1

## 2021-08-16 MED ORDER — OXYCODONE HCL 5 MG PO TABS
5.0000 mg | ORAL_TABLET | ORAL | Status: DC | PRN
  Administered 2021-08-16 – 2021-08-21 (×8): 5 mg via ORAL
  Filled 2021-08-16 (×8): qty 1

## 2021-08-16 MED ORDER — MIDAZOLAM HCL 2 MG/2ML IJ SOLN
INTRAMUSCULAR | Status: AC
Start: 2021-08-16 — End: ?
  Filled 2021-08-16: qty 2

## 2021-08-16 MED ORDER — ONDANSETRON HCL 4 MG/2ML IJ SOLN: 4.0000 mg | Freq: Four times a day (QID) | INTRAMUSCULAR | Status: DC | PRN

## 2021-08-16 MED ORDER — GABAPENTIN 300 MG PO CAPS
300.0000 mg | ORAL_CAPSULE | Freq: Three times a day (TID) | ORAL | Status: DC
  Administered 2021-08-16 – 2021-08-21 (×15): 300 mg via ORAL
  Filled 2021-08-16 (×15): qty 1

## 2021-08-16 MED ORDER — OXYCODONE HCL 5 MG PO TABS
10.0000 mg | ORAL_TABLET | ORAL | Status: DC | PRN
  Administered 2021-08-16 – 2021-08-19 (×13): 10 mg via ORAL
  Filled 2021-08-16 (×13): qty 2

## 2021-08-16 MED ORDER — KETAMINE HCL 10 MG/ML IJ SOLN
INTRAMUSCULAR | Status: DC | PRN
  Administered 2021-08-16: 25 mg via INTRAVENOUS

## 2021-08-16 MED ORDER — LACTATED RINGERS IV SOLN: INTRAVENOUS | Status: DC

## 2021-08-16 MED ORDER — SIMETHICONE 80 MG PO CHEW: 80.0000 mg | CHEWABLE_TABLET | Freq: Four times a day (QID) | ORAL | Status: DC | PRN

## 2021-08-16 MED ORDER — BUPIVACAINE LIPOSOME 1.3 % IJ SUSP
INTRAMUSCULAR | Status: AC
Start: 2021-08-16 — End: ?
  Filled 2021-08-16: qty 20

## 2021-08-16 MED ORDER — BUPIVACAINE LIPOSOME 1.3 % IJ SUSP: 20.0000 mL | Freq: Once | INTRAMUSCULAR | Status: DC

## 2021-08-16 MED ORDER — ENOXAPARIN SODIUM 40 MG/0.4ML IJ SOSY
40.0000 mg | PREFILLED_SYRINGE | Freq: Once | INTRAMUSCULAR | Status: AC
  Administered 2021-08-16: 40 mg via SUBCUTANEOUS
  Filled 2021-08-16: qty 0.4

## 2021-08-16 MED ORDER — ONDANSETRON HCL 4 MG/2ML IJ SOLN
INTRAMUSCULAR | Status: AC
Start: 2021-08-16 — End: ?
  Filled 2021-08-16: qty 2

## 2021-08-16 MED ORDER — KETOROLAC TROMETHAMINE 15 MG/ML IJ SOLN
15.0000 mg | INTRAMUSCULAR | Status: AC
  Administered 2021-08-16: 15 mg via INTRAVENOUS

## 2021-08-16 MED ORDER — KETAMINE HCL 50 MG/5ML IJ SOSY
PREFILLED_SYRINGE | INTRAMUSCULAR | Status: AC
  Filled 2021-08-16: qty 5

## 2021-08-16 MED ORDER — PROPOFOL 10 MG/ML IV BOLUS
INTRAVENOUS | Status: DC | PRN
  Administered 2021-08-16: 160 mg via INTRAVENOUS
  Administered 2021-08-16: 30 mg via INTRAVENOUS
  Administered 2021-08-16: 50 mg via INTRAVENOUS

## 2021-08-16 MED ORDER — LIDOCAINE 2% (20 MG/ML) 5 ML SYRINGE
INTRAMUSCULAR | Status: DC | PRN
  Administered 2021-08-16: 60 mg via INTRAVENOUS

## 2021-08-16 MED ORDER — FENTANYL CITRATE (PF) 100 MCG/2ML IJ SOLN
INTRAMUSCULAR | Status: AC
  Filled 2021-08-16: qty 2

## 2021-08-16 MED ORDER — ONDANSETRON HCL 4 MG/2ML IJ SOLN
INTRAMUSCULAR | Status: DC | PRN
  Administered 2021-08-16 (×2): 4 mg via INTRAVENOUS

## 2021-08-16 MED ORDER — BUPIVACAINE-EPINEPHRINE (PF) 0.25% -1:200000 IJ SOLN
INTRAMUSCULAR | Status: AC
Start: 2021-08-16 — End: ?
  Filled 2021-08-16: qty 30

## 2021-08-16 MED ORDER — PROCHLORPERAZINE EDISYLATE 10 MG/2ML IJ SOLN: 10.0000 mg | INTRAMUSCULAR | Status: DC | PRN

## 2021-08-16 MED ORDER — FENTANYL CITRATE PF 50 MCG/ML IJ SOSY
PREFILLED_SYRINGE | INTRAMUSCULAR | Status: AC
  Filled 2021-08-16: qty 2

## 2021-08-16 MED ORDER — FENTANYL CITRATE (PF) 250 MCG/5ML IJ SOLN
INTRAMUSCULAR | Status: AC
  Filled 2021-08-16: qty 5

## 2021-08-16 MED ORDER — LIDOCAINE HCL 2 % IJ SOLN
INTRAMUSCULAR | Status: AC
  Filled 2021-08-16: qty 20

## 2021-08-16 MED ORDER — ENOXAPARIN SODIUM 40 MG/0.4ML IJ SOSY
40.0000 mg | PREFILLED_SYRINGE | INTRAMUSCULAR | Status: DC
  Administered 2021-08-17 – 2021-08-21 (×5): 40 mg via SUBCUTANEOUS
  Filled 2021-08-16 (×5): qty 0.4

## 2021-08-16 MED ORDER — CHLORHEXIDINE GLUCONATE CLOTH 2 % EX PADS: 6.0000 | MEDICATED_PAD | Freq: Once | CUTANEOUS | Status: DC

## 2021-08-16 MED ORDER — FLUTICASONE PROPIONATE 50 MCG/ACT NA SUSP: 2.0000 | Freq: Every day | NASAL | Status: DC | PRN

## 2021-08-16 MED ORDER — HYDROMORPHONE HCL 1 MG/ML IJ SOLN
1.0000 mg | INTRAMUSCULAR | Status: DC | PRN
  Administered 2021-08-16: 1 mg via INTRAVENOUS
  Filled 2021-08-16: qty 1

## 2021-08-16 MED ORDER — INSULIN ASPART 100 UNIT/ML IJ SOLN
0.0000 [IU] | Freq: Three times a day (TID) | INTRAMUSCULAR | Status: DC
  Administered 2021-08-16: 4 [IU] via SUBCUTANEOUS
  Administered 2021-08-17 – 2021-08-19 (×8): 3 [IU] via SUBCUTANEOUS
  Administered 2021-08-19 – 2021-08-20 (×2): 4 [IU] via SUBCUTANEOUS
  Administered 2021-08-20: 3 [IU] via SUBCUTANEOUS
  Administered 2021-08-20: 4 [IU] via SUBCUTANEOUS
  Administered 2021-08-21: 3 [IU] via SUBCUTANEOUS
  Administered 2021-08-21: 4 [IU] via SUBCUTANEOUS

## 2021-08-16 MED ORDER — INSULIN ASPART 100 UNIT/ML IJ SOLN
0.0000 [IU] | Freq: Every day | INTRAMUSCULAR | Status: DC
  Administered 2021-08-16: 2 [IU] via SUBCUTANEOUS
  Administered 2021-08-18: 3 [IU] via SUBCUTANEOUS

## 2021-08-16 MED ORDER — MIDAZOLAM HCL 5 MG/5ML IJ SOLN
INTRAMUSCULAR | Status: DC | PRN
  Administered 2021-08-16: 2 mg via INTRAVENOUS

## 2021-08-16 MED ORDER — METHOCARBAMOL 1000 MG/10ML IJ SOLN: 500.0000 mg | Freq: Four times a day (QID) | INTRAVENOUS | Status: DC | PRN

## 2021-08-16 MED ORDER — BUPIVACAINE LIPOSOME 1.3 % IJ SUSP
INTRAMUSCULAR | Status: DC | PRN
  Administered 2021-08-16: 20 mL

## 2021-08-16 MED ORDER — ACETAMINOPHEN 500 MG PO TABS
1000.0000 mg | ORAL_TABLET | ORAL | Status: AC
  Administered 2021-08-16: 1000 mg via ORAL
  Filled 2021-08-16: qty 2

## 2021-08-16 MED ORDER — LIDOCAINE HCL (PF) 2 % IJ SOLN
INTRAMUSCULAR | Status: DC | PRN
  Administered 2021-08-16: 1.5 mg/kg/h via INTRADERMAL

## 2021-08-16 MED ORDER — LIDOCAINE HCL (PF) 2 % IJ SOLN
INTRAMUSCULAR | Status: AC
Start: 2021-08-16 — End: ?
  Filled 2021-08-16: qty 5

## 2021-08-16 MED ORDER — DEXAMETHASONE SODIUM PHOSPHATE 10 MG/ML IJ SOLN
INTRAMUSCULAR | Status: AC
  Filled 2021-08-16: qty 1

## 2021-08-16 MED ORDER — CHLORHEXIDINE GLUCONATE 0.12 % MT SOLN
15.0000 mL | Freq: Once | OROMUCOSAL | Status: AC
  Administered 2021-08-16: 15 mL via OROMUCOSAL

## 2021-08-16 MED ORDER — ESMOLOL HCL 100 MG/10ML IV SOLN
INTRAVENOUS | Status: DC | PRN
  Administered 2021-08-16 (×2): 10 mg via INTRAVENOUS

## 2021-08-16 MED ORDER — LACTATED RINGERS IV SOLN
INTRAVENOUS | Status: DC
Start: 2021-08-16 — End: 2021-08-21

## 2021-08-16 MED ORDER — ACETAMINOPHEN 325 MG PO TABS
650.0000 mg | ORAL_TABLET | Freq: Four times a day (QID) | ORAL | Status: DC
  Administered 2021-08-16 – 2021-08-21 (×19): 650 mg via ORAL
  Filled 2021-08-16 (×19): qty 2

## 2021-08-16 MED ORDER — OLOPATADINE HCL 0.1 % OP SOLN: 1.0000 [drp] | Freq: Two times a day (BID) | OPHTHALMIC | Status: DC | PRN

## 2021-08-16 MED ORDER — DEXAMETHASONE SODIUM PHOSPHATE 4 MG/ML IJ SOLN
INTRAMUSCULAR | Status: DC | PRN
  Administered 2021-08-16: 8 mg via INTRAVENOUS

## 2021-08-16 MED ORDER — CEFAZOLIN IN SODIUM CHLORIDE 3-0.9 GM/100ML-% IV SOLN
3.0000 g | INTRAVENOUS | Status: AC
  Administered 2021-08-16: 3 g via INTRAVENOUS
  Filled 2021-08-16: qty 100

## 2021-08-16 MED ORDER — FENTANYL CITRATE PF 50 MCG/ML IJ SOSY
25.0000 ug | PREFILLED_SYRINGE | INTRAMUSCULAR | Status: DC | PRN
  Administered 2021-08-16 (×2): 50 ug via INTRAVENOUS

## 2021-08-16 MED ORDER — KETOROLAC TROMETHAMINE 30 MG/ML IJ SOLN
INTRAMUSCULAR | Status: AC
  Filled 2021-08-16: qty 1

## 2021-08-16 SURGICAL SUPPLY — 69 items
BAG COUNTER SPONGE SURGICOUNT (BAG) ×1 IMPLANT
BLADE SURG SZ11 CARB STEEL (BLADE) ×2 IMPLANT
CHLORAPREP W/TINT 26 (MISCELLANEOUS) ×3 IMPLANT
COVER MAYO STAND STRL (DRAPES) ×2 IMPLANT
COVER TIP SHEARS 8 DVNC (MISCELLANEOUS) ×1 IMPLANT
COVER TIP SHEARS 8MM DA VINCI (MISCELLANEOUS) ×1
DERMABOND ADVANCED (GAUZE/BANDAGES/DRESSINGS) ×1
DERMABOND ADVANCED .7 DNX12 (GAUZE/BANDAGES/DRESSINGS) IMPLANT
DEVICE TROCAR PUNCTURE CLOSURE (ENDOMECHANICALS) IMPLANT
DRAPE ARM DVNC X/XI (DISPOSABLE) ×3 IMPLANT
DRAPE COLUMN DVNC XI (DISPOSABLE) ×1 IMPLANT
DRAPE DA VINCI XI ARM (DISPOSABLE) ×3
DRAPE DA VINCI XI COLUMN (DISPOSABLE) ×1
DRAPE SHEET LG 3/4 BI-LAMINATE (DRAPES) ×1 IMPLANT
ELECT L-HOOK LAP 45CM DISP (ELECTROSURGICAL) ×2
ELECT PENCIL ROCKER SW 15FT (MISCELLANEOUS) ×2 IMPLANT
ELECT REM PT RETURN 15FT ADLT (MISCELLANEOUS) ×2 IMPLANT
ELECTRODE L-HOOK LAP 45CM DISP (ELECTROSURGICAL) ×1 IMPLANT
GLOVE BIO SURGEON STRL SZ7.5 (GLOVE) ×4 IMPLANT
GLOVE BIOGEL PI IND STRL 8 (GLOVE) ×2 IMPLANT
GLOVE BIOGEL PI INDICATOR 8 (GLOVE) ×2
GOWN STRL REUS W/ TWL XL LVL3 (GOWN DISPOSABLE) ×3 IMPLANT
GOWN STRL REUS W/TWL XL LVL3 (GOWN DISPOSABLE) ×3
GRASPER SUT TROCAR 14GX15 (MISCELLANEOUS) IMPLANT
IRRIG SUCT STRYKERFLOW 2 WTIP (MISCELLANEOUS)
IRRIGATION SUCT STRKRFLW 2 WTP (MISCELLANEOUS) IMPLANT
KIT BASIN OR (CUSTOM PROCEDURE TRAY) ×2 IMPLANT
KIT TURNOVER KIT A (KITS) ×1 IMPLANT
MANIFOLD NEPTUNE II (INSTRUMENTS) ×1 IMPLANT
MESH SOFT 12X12IN BARD (Mesh General) ×1 IMPLANT
NDL SPNL 18GX3.5 QUINCKE PK (NEEDLE) ×1 IMPLANT
NEEDLE SPNL 18GX3.5 QUINCKE PK (NEEDLE) ×2 IMPLANT
OBTURATOR OPTICAL STANDARD 8MM (TROCAR) ×1
OBTURATOR OPTICAL STND 8 DVNC (TROCAR) ×1
OBTURATOR OPTICALSTD 8 DVNC (TROCAR) IMPLANT
PACK CARDIOVASCULAR III (CUSTOM PROCEDURE TRAY) ×2 IMPLANT
SEAL CANN UNIV 5-8 DVNC XI (MISCELLANEOUS) ×3 IMPLANT
SEAL XI 5MM-8MM UNIVERSAL (MISCELLANEOUS) ×3
SEALER VESSEL DA VINCI XI (MISCELLANEOUS)
SEALER VESSEL EXT DVNC XI (MISCELLANEOUS) IMPLANT
SOL ANTI FOG 6CC (MISCELLANEOUS) ×1 IMPLANT
SOLUTION ANTI FOG 6CC (MISCELLANEOUS) ×1
SOLUTION ELECTROLUBE (MISCELLANEOUS) ×2 IMPLANT
SPIKE FLUID TRANSFER (MISCELLANEOUS) ×1 IMPLANT
SUT MNCRL AB 4-0 PS2 18 (SUTURE) ×2 IMPLANT
SUT STRAFIX PDS 18 CTX (SUTURE) IMPLANT
SUT STRAFIX SPIRAL 2-0 3 (SUTURE) IMPLANT
SUT STRAFIX SPIRAL 2-0 5 (SUTURE) IMPLANT
SUT STRAFIX SPIRAL 2-0 9 (SUTURE) ×1 IMPLANT
SUT STRAFIX SYMMETRIC 0-0 12 (SUTURE)
SUT STRAFIX SYMMETRIC 0-0 18 (SUTURE)
SUT STRAFIX SYMMETRIC 0-0 24 (SUTURE)
SUT STRAFIX SYMMETRIC 1-0 12 (SUTURE)
SUT STRAFIX SYMMETRIC 1-0 24 (SUTURE) ×4
SUT VIC AB 2-0 SH 27 (SUTURE) ×2
SUT VIC AB 2-0 SH 27X BRD (SUTURE) IMPLANT
SUTURE STRAFIX SYMMETRC 0-0 12 (SUTURE) IMPLANT
SUTURE STRAFIX SYMMETRC 0-0 18 (SUTURE) IMPLANT
SUTURE STRAFIX SYMMETRC 0-0 24 (SUTURE) IMPLANT
SUTURE STRAFIX SYMMETRC 1-0 12 (SUTURE) IMPLANT
SUTURE STRAFIX SYMMETRC 1-0 24 (SUTURE) IMPLANT
SYR 20ML LL LF (SYRINGE) ×2 IMPLANT
TAPE STRIPS DRAPE STRL (GAUZE/BANDAGES/DRESSINGS) ×2 IMPLANT
TOWEL OR 17X26 10 PK STRL BLUE (TOWEL DISPOSABLE) ×2 IMPLANT
TOWEL OR NON WOVEN STRL DISP B (DISPOSABLE) IMPLANT
TROCAR KII 12X100 BLADELESS (ENDOMECHANICALS) ×2 IMPLANT
TROCAR Z-THREAD FIOS 5X100MM (TROCAR) ×2 IMPLANT
TROCAR Z-THREAD OPTICAL 5X100M (TROCAR) IMPLANT
TUBING INSUFFLATION 10FT LAP (TUBING) ×2 IMPLANT

## 2021-08-16 NOTE — Transfer of Care (Signed)
Immediate Anesthesia Transfer of Care Note  Patient: Rita Bass  Procedure(s) Performed: Procedure(s) with comments: XI ROBOT ASSISTED RECURRENT UMBILICAL HERNIA REPAIR WITH MESH (N/A) - removal of intraperitoneal mesh  Patient Location: PACU  Anesthesia Type:General  Level of Consciousness: Patient easily awoken, sedated, comfortable, cooperative, following commands, responds to stimulation.   Airway & Oxygen Therapy: Patient spontaneously breathing, ventilating well, oxygen via simple oxygen mask.  Post-op Assessment: Report given to PACU RN, vital signs reviewed and stable, moving all extremities.   Post vital signs: Reviewed and stable.  Complications: No apparent anesthesia complications  Last Vitals:  Vitals Value Taken Time  BP 162/97 08/16/21 1331  Temp    Pulse 92 08/16/21 1333  Resp 17 08/16/21 1333  SpO2 100 % 08/16/21 1333  Vitals shown include unvalidated device data.  Last Pain:  Vitals:   08/16/21 0658  TempSrc: Oral  PainSc:          Complications: No notable events documented.

## 2021-08-16 NOTE — Anesthesia Procedure Notes (Signed)
Procedure Name: Intubation Date/Time: 08/16/2021 8:37 AM Performed by: Deliah Boston, CRNA Pre-anesthesia Checklist: Patient identified, Emergency Drugs available, Suction available and Patient being monitored Patient Re-evaluated:Patient Re-evaluated prior to induction Oxygen Delivery Method: Circle system utilized Preoxygenation: Pre-oxygenation with 100% oxygen Induction Type: IV induction Ventilation: Mask ventilation without difficulty Laryngoscope Size: Mac and 3 Grade View: Grade I Tube type: Oral Tube size: 7.5 mm Number of attempts: 1 Airway Equipment and Method: Stylet Placement Confirmation: ETT inserted through vocal cords under direct vision, positive ETCO2 and breath sounds checked- equal and bilateral Secured at: 21 cm Tube secured with: Tape Dental Injury: Teeth and Oropharynx as per pre-operative assessment

## 2021-08-16 NOTE — Op Note (Signed)
Patient: Rita Bass ([birth year], [id number])  Date of Surgery: 08/16/2021   Preoperative Diagnosis: Recurrent umbilical hernia   Postoperative Diagnosis: Recurrent umbilical hernia   Surgical Procedure:  XI ROBOT ASSISTED RECURRENT UMBILICAL HERNIA REPAIR WITH MESH BILATERAL POSTERIOR RECTUS MYOFASCIAL RELEASE BILATERAL TRANSVERSUS ABDOMINIS MYOFASCIAL RELEASE REMOVAL OF INTRAPERITONEAL MESH  Operative Team Members:  Surgeon(s) and Role:    * Levin Dagostino, Nickola Major, MD - Primary  Assistant  Malachi Pro, PA-C Assistant  Anesthesiologist: Belinda Block, MD CRNA: Raenette Rover, CRNA; Deliah Boston, CRNA   Anesthesia: General   Fluids:  Total I/O In: 2100 [I.V.:2100] Out: 100 [Urine:75; [id number]  Complications: None  Drains:  None  Specimen: None  Disposition:  PACU - hemodynamically stable.  Plan of Care: Admit for overnight observation  Indications for Procedure: Rita Bass is a 56 y.o. female who presented with a recurrent umbilical hernia.  I recommended robotic ventral hernia repair with mesh.  The procedure itself as well as the risks, benefits and alternatives were described.  The risks discussed included but were not limited to the risk of infection, bleeding, damage to nearby structures, recurrent hernia, chronic pain, and mesh complication requiring removal.  After a full discussion and all questions answered, the patient granted consent to proceed.  Findings:  Hernia Location: Ventral hernia location: Umbilical (M3) Hernia Size:  15 cm tall x 10 cm wide  Mesh Size &Type:  36 cm tall x 34 cm wide Bard Soft Mesh Mesh Position: Sublay - Retromuscular Myofascial Releases:  - Bilateral posterior rectus myofascial release  - Bilateral transversus abdominus myofascial release   Description of Procedure: The patient was positioned supine, moderately flexed at the umbilical level, padded and secured on the operating table.  A timeout  procedure was performed.    What is described is a robotic, totally extraperitoneal retromuscular incisional hernia repair with bilateral rectus myofascial release, bilateral transversus abdominis release, removal of previous intraperitoneal mesh and retromuscular mesh placement.  Laparoscopic Portion: The retrorectus space was entered in the LEFT hypochondrium, at approximately the midclavicular line utilizing a 5 mm optical-viewing trocar.  Upon safe entry into this space, it was insufflated while performing a blunt dissection with the camera still in the optical trocar. A rectus myofascial release was performed on the LEFT side. Dissection was carried out laterally in the retromuscular plane to the edge of the rectus sheath progressively disconnecting the rectus muscle from the underlying posterior rectus sheath. Both the segmental innervation as well as the intercostal artery and vein brances to the rectus muscle were individually preserved.    During the left sided retrorectus dissection, a 12 mm trocar was placed into the lateral most edge of the retrorectus space.  With these initial trocars in position, the medial most aspect of the retrorectus plane was identified, and the posterior sheath was visualized as it inserted on the linea alba. The posterior sheath was incised with cautery entering the preperitoneal plane. A crossover was performed dissecting under the linea alba in the preperitoneal plane until the right rectus sheath was identified.  After identification of the right rectus sheath, it was incised vertically to enter the retrorectus space on the right. A rectus myofascial release was performed on the RIGHT side.  Blunt dissection was carried out laterally in the retromuscular plane to the edge of the rectus sheath progressively disconnecting the rectus muscle from the underlying posterior rectus sheath. Both the segmental innervation as well as the intercostal artery and vein brances to  the rectus muscle were individually preserved.   At this juncture, both retrorectus planes were initially connected to each other and there was space for further trocar placement. An 8 mm robotic trocar was placed in the midclavicular line in right retrorectus space.  A 99mm robotic trocar was placed within the left rectus musculature in the upper abdomen, and not through the linea alba.  The initial 5 mm access trocar in the midclavicular line within the left retrorectus space was switched out for an 8 mm robotic trocar.   Robotic Portion: The Intuitive daVinci Xi surgical robot was docked in the standard fashion and the procedure begun from the robotic console. A Prograsp instrument and monopolar shears were used for the dissection.  Dissection was carried down inferiorly preserving the peritoneum and the preperitoneal fat in the midline as it was gently dissected off of the overlying linea alba.  On the right side, the posterior rectus sheath was progressively disconnected from its insertion on the linea alba. This allowed for progression of the right side rectus myofascial release.  The rectus myofascial release accomplished medialization of the posterior rectus sheath towards the midline and disinsertion of the rectus muscle from its surrounding fascia, and thus its encasement in the rectus sheath, allowing for widening of the rectus muscle and transfer of the rectus flap towards the midline.  This will allow for future inset of the medial aspect of the flap for abdominal wall reconstruction.  Similarly, on the left side, the posterior rectus sheath was also progressively disconnected from its insertion on the linea alba.  This allowed for progression of the left side rectus myofascial release.  The rectus myofascial release accomplished medialization of the posterior rectus sheath towards the midline and disinsertion of the rectus muscle from its surrounding fascia, and thus its encasement in the  rectus sheath, allowing for widening of the rectus muscle and transfer of the rectus flap towards the midline.  This will allow for future inset of the medial aspect of the flap for abdominal wall reconstruction.  During the dissection of the midline the hernia defect was identified and the hernia sac was not reducible, therefore the hernia peritoneum was incised circumferentially around the edge of the hernia defect which left a defect within the peritoneum in the midline.  This defect was later closed with a running 2-0 Ethicon Stratafix Spiral PDS suture.  Both the left and the right rectus myofascial releases were performed towards the lower abdomen, past the arcuate line bilaterally.  During this dissection, the peritoneum and preperitoneal fat in the midline were further preserved below the hernia as they were dissected off of the overlying linea alba.   The hernia defect area was now visualized fully.  The hernia defects were located in the umbilical region. Utilizing a metric ruler, the defect are was measured intracorporeally to be 10 cm horizontal by 15 cm vertical.  Due to the large hernia defect, and large peritoneal defect, decision was made to perform bilateral transversus abdominis releases to release tension on the abdominal wall and achieve optimal abdominal wall reconstruction.  A transversus abdominis release (TAR) was performed on the left side.  The transversus abdominis muscle was identified deep to the posterior rectus sheath and incised vertically along its entire length, entering the pre-peritoneal or pre-transversalis fascia plane.  This disinserted the transversus abdominis muscle from the linea semilunaris.  Since the intercostal nerves, arteries and veins had been preserved during the rectus myofascial release portion of the procedure, they  remained intact during the TAR. The peritoneum was subsequently peeled away from the underside of the divided transversus abdominis muscle.   This dissection was carried out laterally towards the retroperitoneum.  The TAR accomplished additional medialization of the posterior rectus sheath with its attached peritoneum towards the midline to allow for visceral sac closure.  The TAR also provided further offset of tension of the rectus muscle flap with additional transfer of the rectus muscle towards the midline, as it remained attached to the external and internal abdominal oblique muscles.  This will allow for future inset of the medial aspect of the flap for abdominal wall reconstruction.   A transversus abdominis release (TAR) was performed on the right side.  The transversus abdominis muscle was identified deep to the posterior rectus sheath and incised vertically along its entire length, entering the pre-peritoneal or pre-transversalis fascia plane.  This disinserted the transversus abdominis muscle from the linea semilunaris.  Since the intercostal nerves, arteries and veins had been preserved during the rectus myofascial release portion of the procedure, they remained intact during the TAR. The peritoneum was subsequently peeled away from the underside of the divided transversus abdominis muscle.  This dissection was carried out laterally towards the retroperitoneum.  The TAR accomplished additional medialization of the posterior rectus sheath with its attached peritoneum towards the midline to allow for visceral sac closure.  The TAR also provided further offset of tension of the rectus muscle flap with additional transfer of the rectus muscle towards the midline, as it remained attached to the external and internal abdominal oblique muscles.  This will allow for future inset of the medial aspect of the flap for abdominal wall reconstruction.   The hernia defect was closed utilizing a continuous, #1 Ethicon Stratafix Symmetric PDS Plus suture.  The hernia defect, and subsequently the rectus musculature, came together well for a complete  abdominal wall reconstruction.  The dissected out retrorectus space was measured with a metric ruler so as to determine the size of the proposed mesh.    The robot was undocked and the laparoscope was inserted, inspecting for hemostasis.  The mesh deployment was performed laparoscopically.  Laparoscopic Portion:  A transversus abdominis plane (TAP) block was performed bilaterally with a mixture of marcaine and Exparel.  The anesthetic was first injected into the plane between the transversus abdominis and internal abdominal oblique muscles on the left. The TAP was repeated on the contralateral side.   A piece of bard soft was opened and trimmed to 36 cm tall x 34 cm wide. The mesh was advanced into the retrorectus space and the mesh positioned flat against the intact posterior rectus sheaths. The mesh was not fixated as it occupied the entire retromuscular plane, and also covered all of the trocars.  The trocars were removed and the skin closed with 4-0 Monocryl subcuticular sutures and skin glue.   Louanna Raw, MD General, Bariatric, & Minimally Invasive Surgery [facility], [place]

## 2021-08-16 NOTE — Anesthesia Preprocedure Evaluation (Addendum)
Anesthesia Evaluation  Patient identified by MRN, date of birth, ID band Patient awake    Reviewed: Allergy & Precautions, NPO status , Patient's Chart, lab work & pertinent test results  Airway Mallampati: II  TM Distance: >3 FB     Dental   Pulmonary shortness of breath, asthma , sleep apnea ,    breath sounds clear to auscultation       Cardiovascular hypertension, + Orthopnea   Rhythm:Regular Rate:Normal     Neuro/Psych    GI/Hepatic Neg liver ROS, GERD  ,  Endo/Other  diabetes  Renal/GU Renal disease     Musculoskeletal   Abdominal   Peds  Hematology   Anesthesia Other Findings   Reproductive/Obstetrics                             Anesthesia Physical Anesthesia Plan  ASA: 3  Anesthesia Plan: General   Post-op Pain Management:    Induction: Intravenous  PONV Risk Score and Plan: 3 and Ondansetron, Dexamethasone, Midazolam and Treatment may vary due to age or medical condition  Airway Management Planned: Oral ETT  Additional Equipment:   Intra-op Plan:   Post-operative Plan: Extubation in OR  Informed Consent: I have reviewed the patients History and Physical, chart, labs and discussed the procedure including the risks, benefits and alternatives for the proposed anesthesia with the patient or authorized representative who has indicated his/her understanding and acceptance.     Dental advisory given  Plan Discussed with: CRNA and Anesthesiologist  Anesthesia Plan Comments:         Anesthesia Quick Evaluation

## 2021-08-16 NOTE — H&P (Signed)
Admitting Physician: Rita Bass  Service: General Surgery  CC: Hernia  Subjective   HPI: Rita Bass is an 56 y.o. female who is here for hernia repair.  Past Medical History:  Diagnosis Date   Arthritis    Asthma    Chronic pain of left knee    Complication of anesthesia    PONV   Diabetes (HCC)    GERD (gastroesophageal reflux disease)    H/O bronchitis    Kidney stones    Obesity    Renal disorder    Shortness of breath dyspnea    Sleep apnea    past issues - at weight over 400lbs   Sleep apnea in adult    Polysomnogram pending.  Followed by Dr. Craige Cotta    Past Surgical History:  Procedure Laterality Date   BIOPSY  11/01/2020   Procedure: BIOPSY;  Surgeon: Shellia Cleverly, DO;  Location: [facility];  Service: Gastroenterology;;   BIOPSY  05/11/2021   Procedure: BIOPSY;  Surgeon: Shellia Cleverly, DO;  Location: [facility];  Service: Gastroenterology;;   CESAREAN SECTION     6 c-sections   CHOLECYSTECTOMY     COLONOSCOPY WITH PROPOFOL N/A 05/11/2021   Procedure: COLONOSCOPY WITH PROPOFOL;  Surgeon: Shellia Cleverly, DO;  Location: [facility];  Service: Gastroenterology;  Laterality: N/A;   ESOPHAGOGASTRODUODENOSCOPY (EGD) WITH PROPOFOL N/A 11/01/2020   Procedure: ESOPHAGOGASTRODUODENOSCOPY (EGD) WITH PROPOFOL;  Surgeon: Shellia Cleverly, DO;  Location: [facility];  Service: Gastroenterology;  Laterality: N/A;   HERNIA REPAIR     KIDNEY STONE SURGERY     OOPHORECTOMY     TRANSTHORACIC ECHOCARDIOGRAM  09/2017   Technically difficult study.  Did not use Definity contrast.  EF was 55 to 60% with moderate LVH and grade 1 diastolic dysfunction.  No significant valvular lesions noted.  No regional wall motion normality but difficult to assess due to poor imaging.    TUBAL LIGATION     UPPER GASTROINTESTINAL ENDOSCOPY      Family History  Problem Relation Age of Onset   Breast cancer Mother    Other Father        committed suicide    Diabetes Father    Heart attack Maternal Aunt    Colon cancer Neg Hx    Esophageal cancer Neg Hx    Pancreatic cancer Neg Hx    Stomach cancer Neg Hx    Liver disease Neg Hx    Colon polyps Neg Hx    Rectal cancer Neg Hx     Social:  reports that she has never smoked. She has never used smokeless tobacco. She reports that she does not drink alcohol and does not use drugs.  Allergies: No Known Allergies  Medications: Current Outpatient Medications  Medication Instructions   ACCU-CHEK GUIDE test strip TEST ONCE DAILY   Accu-Chek Softclix Lancets lancets Use as instructed   acetaminophen (TYLENOL) 650-1,300 mg, Oral, Every 8 hours PRN   atorvastatin (LIPITOR) 40 mg, Oral, Daily   cetirizine (ZYRTEC) 10 mg, Oral, Daily   fluticasone (FLONASE) 50 MCG/ACT nasal spray 2 sprays, Each Nare, Daily   Insulin Pen Needle (PEN NEEDLES) 32G X 5 MM MISC Use as directed with victoza   lidocaine-prilocaine (EMLA) cream 1 application., Topical, As needed   liraglutide (VICTOZA) 1.8 mg, Subcutaneous, Daily, Inject 1.8 mg into the abdomen once daily.   olopatadine (PATANOL) 0.1 % ophthalmic solution 1 drop, Both Eyes, 2 times daily   omeprazole (PRILOSEC) 20  MG capsule TAKE 1 CAPSULE(20 MG) BY MOUTH TWICE DAILY BEFORE A MEAL   ondansetron (ZOFRAN) 4 mg, Oral, Every 8 hours PRN   traMADol (ULTRAM) 50 MG tablet TAKE 1 TABLET(50 MG) BY MOUTH EVERY 12 HOURS AS NEEDED FOR MODERATE PAIN    ROS - all of the below systems have been reviewed with the patient and positives are indicated with bold text General: chills, fever or night sweats Eyes: blurry vision or double vision ENT: epistaxis or sore throat Allergy/Immunology: itchy/watery eyes or nasal congestion Hematologic/Lymphatic: bleeding problems, blood clots or swollen lymph nodes Endocrine: temperature intolerance or unexpected weight changes Breast: new or changing breast lumps or nipple discharge Resp: cough, shortness of breath, or  wheezing CV: chest pain or dyspnea on exertion GI: as per HPI GU: dysuria, trouble voiding, or hematuria MSK: joint pain or joint stiffness Neuro: TIA or stroke symptoms Derm: pruritus and skin lesion changes Psych: anxiety and depression  Objective   PE Blood pressure (!) 140/94, pulse (!) 103, temperature 97.6 F (36.4 C), temperature source Oral, resp. rate 16, height 5\' 4"  (1.626 m), weight 120.2 kg, last menstrual period 04/05/2016, SpO2 96 %. Constitutional: NAD; conversant; no deformities Eyes: Moist conjunctiva; no lid lag; anicteric; PERRL Neck: Trachea midline; no thyromegaly Lungs: Normal respiratory effort; no tactile fremitus CV: RRR; no palpable thrills; no pitting edema GI: Abd Obese, palpable right/midline hernia; no palpable hepatosplenomegaly MSK: Normal range of motion of extremities; no clubbing/cyanosis Psychiatric: Appropriate affect; alert and oriented x3 Lymphatic: No palpable cervical or axillary lymphadenopathy  Results for orders placed or performed during the hospital encounter of 08/16/21 (from the past 24 hour(s))  Glucose, capillary     Status: Abnormal   Collection Time: 08/16/21  6:30 AM  Result Value Ref Range   Glucose-Capillary 135 (H) 70 - 99 mg/dL    Imaging Orders  No imaging studies ordered today  CT Renal Stone Study 04/07/21 1. 1 mm to 2 mm distal left ureteral calculus. 2. 3 mm nonobstructing left renal calculus. 3. Large ventral hernia, involving the mid to lower abdominal wall, containing fat and a short segment of mildly thickened transverse colon. Focal colitis involving this segment of large bowel cannot be excluded.   Assessment and Plan   Rita Bass is an 56 y.o. female with recurrent umbilical hernia with incarceration measuring 7.8 cm tall by 4.7 cm wide.  Morbid obesity with BMI of 50.0-59.9, adult (CMS-HCC)  Diabetes mellitus type 2 in obese (CMS-HCC)  Gastroesophageal reflux disease, unspecified whether  esophagitis present  Obstructive sleep apnea   This hernia appears severely symptomatic with episodes of pain nausea and vomiting. We discussed her obesity and optimization prior to surgery. Thankfully she is already lost 200 pounds, however I feel with the symptoms she is having related this hernia we can not continue to delay surgery to further optimize her. She is thought about weight loss surgery in the past, however I feel the hernia needs to be dealt with prior to any other operations due to the severe symptomatic nature of the hernia and my concern for incarceration and strangulation requiring emergency hernia surgery.  I recommended robotic, possibly open, recurrent umbilical hernia repair with mesh. The procedure itself as well as its risk, benefits, and alternatives were discussed with the patient in full. After full discussion all questions answered the patient granted consent to proceed. Our surgery scheduler will reach out to the patient to schedule surgery.   Felicie Morn, MD  [facility] Use [email or web address] to contact on call provider

## 2021-08-16 NOTE — Anesthesia Postprocedure Evaluation (Signed)
Anesthesia Post Note  Patient: Archivist  Procedure(s) Performed: XI ROBOT ASSISTED RECURRENT UMBILICAL HERNIA REPAIR WITH MESH     Patient location during evaluation: PACU Anesthesia Type: General Level of consciousness: awake Pain management: pain level controlled Vital Signs Assessment: post-procedure vital signs reviewed and stable Respiratory status: spontaneous breathing Cardiovascular status: stable Postop Assessment: no apparent nausea or vomiting Anesthetic complications: no   No notable events documented.  Last Vitals:  Vitals:   08/16/21 0658 08/16/21 1332  BP: (!) 140/94 (!) 162/97  Pulse: (!) 103 95  Resp: 16 18  Temp: 36.4 C 36.8 C  SpO2: 96% 100%    Last Pain:  Vitals:   08/16/21 1332  TempSrc:   PainSc: 6                  Natalea Sutliff

## 2021-08-17 LAB — CBC
HCT: 34 % — ABNORMAL LOW (ref 36.0–46.0)
Hemoglobin: 10.9 g/dL — ABNORMAL LOW (ref 12.0–15.0)
MCH: 25.5 pg — ABNORMAL LOW (ref 26.0–34.0)
MCHC: 32.1 g/dL (ref 30.0–36.0)
MCV: 79.6 fL — ABNORMAL LOW (ref 80.0–100.0)
Platelets: 292 10*3/uL (ref 150–400)
RBC: 4.27 MIL/uL (ref 3.87–5.11)
RDW: 15.8 % — ABNORMAL HIGH (ref 11.5–15.5)
WBC: 13.4 10*3/uL — ABNORMAL HIGH (ref 4.0–10.5)
nRBC: 0 % (ref 0.0–0.2)

## 2021-08-17 LAB — BASIC METABOLIC PANEL
Anion gap: 7 (ref 5–15)
BUN: 8 mg/dL (ref 6–20)
CO2: 27 mmol/L (ref 22–32)
Calcium: 8.7 mg/dL — ABNORMAL LOW (ref 8.9–10.3)
Chloride: 106 mmol/L (ref 98–111)
Creatinine, Ser: 0.58 mg/dL (ref 0.44–1.00)
GFR, Estimated: >60 mL/min (ref >60)
Glucose, Bld: 138 mg/dL — ABNORMAL HIGH (ref 70–99)
Potassium: 4 mmol/L (ref 3.5–5.1)
Sodium: 140 mmol/L (ref 135–145)

## 2021-08-17 LAB — GLUCOSE, CAPILLARY
Glucose-Capillary: 123 mg/dL — ABNORMAL HIGH (ref 70–99)
Glucose-Capillary: 134 mg/dL — ABNORMAL HIGH (ref 70–99)
Glucose-Capillary: 138 mg/dL — ABNORMAL HIGH (ref 70–99)
Glucose-Capillary: 150 mg/dL — ABNORMAL HIGH (ref 70–99)

## 2021-08-17 NOTE — Progress Notes (Signed)
Transition of Care [facility]) Screening Note  Patient Details  Name: Rita Bass Date of Birth: 10-Sep-1965  Transition of Care [facility] CM/SW Contact:    Ewing Schlein, LCSW Phone Number: 08/17/2021, 10:37 AM  Transition of Care Department [facility]) has reviewed patient and no TOC needs have been identified at this time. We will continue to monitor patient advancement through interdisciplinary progression rounds. If new patient transition needs arise, please place a TOC consult.

## 2021-08-17 NOTE — Progress Notes (Signed)
Progress Note: General Surgery Service   Chief Complaint/Subjective: Pain up into her chest.  Objective: Vital signs in last 24 hours: Temp:  [97.5 F (36.4 C)-98.3 F (36.8 C)] 97.5 F (36.4 C) (06/01 0607) Pulse Rate:  [78-99] 78 (06/01 0607) Resp:  [12-18] 16 (06/01 0607) BP: (100-162)/(66-97) 100/66 (06/01 0607) SpO2:  [97 %-100 %] 100 % (06/01 0607)    Intake/Output from previous day: 05/31 0701 - 06/01 0700 In: 2842.5 [P.O.:240; I.V.:2602.5] Out: 500 [Urine:475; Blood:25] Intake/Output this shift: Total I/O In: 480 [P.O.:480] Out: 0   GI: Abd Incisions c/d/I w/ some bruising and glue  Lab Results: CBC  Recent Labs    08/16/21 1613 08/17/21 0454  WBC 14.3* 13.4*  HGB 12.5 10.9*  HCT 39.8 34.0*  PLT 274 292   BMET Recent Labs    08/16/21 1613 08/17/21 0454  NA  --  140  K  --  4.0  CL  --  106  CO2  --  27  GLUCOSE  --  138*  BUN  --  8  CREATININE 0.71 0.58  CALCIUM  --  8.7*   PT/INR No results for input(s): LABPROT, INR in the last 72 hours. ABG No results for input(s): PHART, HCO3 in the last 72 hours.  Invalid input(s): PCO2, PO2  Anti-infectives: Anti-infectives (From admission, onward)    Start     Dose/Rate Route Frequency Ordered Stop   08/16/21 0630  ceFAZolin (ANCEF) IVPB 3g/100 mL premix        3 g 200 mL/hr over 30 Minutes Intravenous On call to O.R. 08/16/21 0622 08/16/21 0839       Medications: Scheduled Meds:  acetaminophen  650 mg Oral Q6H   docusate sodium  100 mg Oral BID   enoxaparin (LOVENOX) injection  40 mg Subcutaneous Q24H   gabapentin  300 mg Oral TID   insulin aspart  0-20 Units Subcutaneous TID WC   insulin aspart  0-5 Units Subcutaneous QHS   Continuous Infusions:  lactated ringers 75 mL/hr at 08/16/21 2149   methocarbamol (ROBAXIN) IV     PRN Meds:.fluticasone, HYDROmorphone (DILAUDID) injection, loratadine, methocarbamol (ROBAXIN) IV, olopatadine, ondansetron (ZOFRAN) IV, oxyCODONE, oxyCODONE,  prochlorperazine, simethicone  Assessment/Plan: s/p Procedure(s): XI ROBOT ASSISTED RECURRENT UMBILICAL HERNIA REPAIR WITH MESH 08/16/2021  Doing well POD1 Still with pain control and O2 needs Possible discharge tomorrow   LOS: 0 days     Felicie Morn, MD  [facility] Use [email or web address] to contact on call provider

## 2021-08-18 DIAGNOSIS — G4733 Obstructive sleep apnea (adult) (pediatric): Secondary | ICD-10-CM | POA: Diagnosis not present

## 2021-08-18 DIAGNOSIS — M6281 Muscle weakness (generalized): Secondary | ICD-10-CM | POA: Diagnosis not present

## 2021-08-18 LAB — BASIC METABOLIC PANEL
Anion gap: 6 (ref 5–15)
BUN: 9 mg/dL (ref 6–20)
CO2: 29 mmol/L (ref 22–32)
Calcium: 8.5 mg/dL — ABNORMAL LOW (ref 8.9–10.3)
Chloride: 104 mmol/L (ref 98–111)
Creatinine, Ser: 0.53 mg/dL (ref 0.44–1.00)
GFR, Estimated: >60 mL/min (ref >60)
Glucose, Bld: 136 mg/dL — ABNORMAL HIGH (ref 70–99)
Potassium: 3.6 mmol/L (ref 3.5–5.1)
Sodium: 139 mmol/L (ref 135–145)

## 2021-08-18 LAB — GLUCOSE, CAPILLARY
Glucose-Capillary: 122 mg/dL — ABNORMAL HIGH (ref 70–99)
Glucose-Capillary: 150 mg/dL — ABNORMAL HIGH (ref 70–99)
Glucose-Capillary: 134 mg/dL — ABNORMAL HIGH (ref 70–99)
Glucose-Capillary: 205 mg/dL — ABNORMAL HIGH (ref 70–99)

## 2021-08-18 LAB — CBC
HCT: 31.3 % — ABNORMAL LOW (ref 36.0–46.0)
Hemoglobin: 9.8 g/dL — ABNORMAL LOW (ref 12.0–15.0)
MCH: 25 pg — ABNORMAL LOW (ref 26.0–34.0)
MCHC: 31.3 g/dL (ref 30.0–36.0)
MCV: 79.8 fL — ABNORMAL LOW (ref 80.0–100.0)
Platelets: 256 10*3/uL (ref 150–400)
RBC: 3.92 MIL/uL (ref 3.87–5.11)
RDW: 16 % — ABNORMAL HIGH (ref 11.5–15.5)
WBC: 9.7 10*3/uL (ref 4.0–10.5)
nRBC: 0 % (ref 0.0–0.2)

## 2021-08-18 MED ORDER — OXYCODONE-ACETAMINOPHEN 5-325 MG PO TABS: 1.0000 | ORAL_TABLET | ORAL | 0 refills | Status: DC | PRN

## 2021-08-18 NOTE — Progress Notes (Signed)
Patient voices that she feels "I have busted open". Incisions on abdomen assessed, all are approximated with no drainage. Patient afebrile.

## 2021-08-18 NOTE — Progress Notes (Signed)
Progress Note: General Surgery Service   Chief Complaint/Subjective: Continued pain along hernia repair and up into chest. Some lower blood pressures yesterday. Hasn't been up much.  Objective: Vital signs in last 24 hours: Temp:  [98.1 F (36.7 C)-98.4 F (36.9 C)] 98.4 F (36.9 C) (06/02 0507) Pulse Rate:  [75-99] 99 (06/02 0507) Resp:  [16-18] 16 (06/02 0507) BP: (98-112)/(64-70) 98/65 (06/02 0507) SpO2:  [97 %-98 %] 98 % (06/02 0507)    Intake/Output from previous day: 06/01 0701 - 06/02 0700 In: 1440 [P.O.:1440] Out: 2050 [Urine:2050] Intake/Output this shift: No intake/output data recorded.  GI: Abd Incisions c/d/I w/ some bruising and glue  Lab Results: CBC  Recent Labs    08/17/21 0454 08/18/21 0350  WBC 13.4* 9.7  HGB 10.9* 9.8*  HCT 34.0* 31.3*  PLT 292 256    BMET Recent Labs    08/17/21 0454 08/18/21 0350  NA 140 139  K 4.0 3.6  CL 106 104  CO2 27 29  GLUCOSE 138* 136*  BUN 8 9  CREATININE 0.58 0.53  CALCIUM 8.7* 8.5*    PT/INR No results for input(s): LABPROT, INR in the last 72 hours. ABG No results for input(s): PHART, HCO3 in the last 72 hours.  Invalid input(s): PCO2, PO2  Anti-infectives: Anti-infectives (From admission, onward)    Start     Dose/Rate Route Frequency Ordered Stop   08/16/21 0630  ceFAZolin (ANCEF) IVPB 3g/100 mL premix        3 g 200 mL/hr over 30 Minutes Intravenous On call to O.R. 08/16/21 [id number] 08/16/21 0839       Medications: Scheduled Meds:  acetaminophen  650 mg Oral Q6H   docusate sodium  100 mg Oral BID   enoxaparin (LOVENOX) injection  40 mg Subcutaneous Q24H   gabapentin  300 mg Oral TID   insulin aspart  0-20 Units Subcutaneous TID WC   insulin aspart  0-5 Units Subcutaneous QHS   Continuous Infusions:  lactated ringers 75 mL/hr at 08/16/21 2149   methocarbamol (ROBAXIN) IV     PRN Meds:.fluticasone, HYDROmorphone (DILAUDID) injection, loratadine, methocarbamol (ROBAXIN) IV, olopatadine,  ondansetron (ZOFRAN) IV, oxyCODONE, oxyCODONE, prochlorperazine, simethicone  Assessment/Plan: s/p Procedure(s): XI ROBOT ASSISTED RECURRENT UMBILICAL HERNIA REPAIR WITH MESH 08/16/2021  Doing well POD2 Large dissection, not surprising, slower for pain to improve Hasn't passed gas Still with pain control and O2 needs Acute blood loss anemia - watch blood pressure, hemoglobin Remain in hospital today, possible discharge tomorrow   LOS: 1 day     Quentin Ore, MD  [facility] Use [email or web address] to contact on call provider

## 2021-08-18 NOTE — Plan of Care (Signed)
  Problem: Education: Goal: Knowledge of General Education information will improve Description: Including pain rating scale, medication(s)/side effects and non-pharmacologic comfort measures Outcome: Progressing   Problem: Activity: Goal: Risk for activity intolerance will decrease Outcome: Progressing   Problem: Nutrition: Goal: Adequate nutrition will be maintained Outcome: Progressing   Problem: Elimination: Goal: Will not experience complications related to bowel motility Outcome: Progressing Goal: Will not experience complications related to urinary retention Outcome: Progressing   Problem: Pain Managment: Goal: General experience of comfort will improve Outcome: Progressing   Problem: Safety: Goal: Ability to remain free from injury will improve Outcome: Progressing   Problem: Clinical Measurements: Goal: Postoperative complications will be avoided or minimized Outcome: Progressing

## 2021-08-18 NOTE — Evaluation (Signed)
Physical Therapy Evaluation Patient Details Name: Rita Bass MRN: [id number] DOB: September 01, 1965 Today's Date: 08/18/2021  History of Present Illness  Pt s/p umbilical hernia repair [id number] and with hx of DM, L knee pain and morbid obesity  Clinical Impression  Pt admitted as above and presenting with functional mobility limitations 2* post op pain and ambulatory balance deficits.  Pt should progress to dc home with family assist and would benefit from use of RW for balance, stability and endurance.     Recommendations for follow up therapy are one component of a multi-disciplinary discharge planning process, led by the attending physician.  Recommendations may be updated based on patient status, additional functional criteria and insurance authorization.  Follow Up Recommendations No PT follow up    Assistance Recommended at Discharge Frequent or constant Supervision/Assistance  Patient can return home with the following  A little help with walking and/or transfers;A lot of help with bathing/dressing/bathroom;Assist for transportation;Help with stairs or ramp for entrance    Equipment Recommendations Rolling walker (2 wheels)  Recommendations for Other Services       Functional Status Assessment Patient has had a recent decline in their functional status and demonstrates the ability to make significant improvements in function in a reasonable and predictable amount of time.     Precautions / Restrictions Precautions Precautions: Fall;Other (comment) Precaution Comments: Post abdominal surgery Restrictions Weight Bearing Restrictions: No      Mobility  Bed Mobility Overal bed mobility: Needs Assistance Bed Mobility: Supine to Sit     Supine to sit: Min assist, Mod assist, HOB elevated     General bed mobility comments: Log roll attempted but pt unable 2* pain; HOB elevated and use of bed rail to move supine to sit; pt states she may sleep in recliner intially     Transfers Overall transfer level: Needs assistance Equipment used: Rolling walker (2 wheels) Transfers: Sit to/from Stand Sit to Stand: Min assist           General transfer comment: cues for transition position and use of UEs to self assist    Ambulation/Gait Ambulation/Gait assistance: Min assist, Min guard Gait Distance (Feet): 140 Feet Assistive device: Rolling walker (2 wheels) Gait Pattern/deviations: Step-to pattern, Step-through pattern, Decreased step length - right, Decreased step length - left, Shuffle, Trunk flexed Gait velocity: decr     General Gait Details: Increased time with cues for posture and position from [employer]            Wheelchair Mobility    Modified Rankin (Stroke Patients Only)       Balance Overall balance assessment: Needs assistance Sitting-balance support: No upper extremity supported, Feet supported Sitting balance-Leahy Scale: Good     Standing balance support: No upper extremity supported Standing balance-Leahy Scale: Fair                               Pertinent Vitals/Pain Pain Assessment Pain Assessment: Faces Faces Pain Scale: Hurts even more Pain Location: abdomen with mobilization Pain Descriptors / Indicators: Aching, Sore, Grimacing, Guarding Pain Intervention(s): Limited activity within patient's tolerance, Monitored during session, Premedicated before session    [place] expects to be discharged to:: Private residence Living Arrangements: Spouse/significant other;Children Available Help at Discharge: Family;Available 24 hours/day Type of Home: House Home Access: Stairs to enter Entrance Stairs-Rails: Right;Left;Can reach both Entrance Stairs-Number of Steps: 3   Home Layout: One level Home Equipment:  None      Prior Function Prior Level of Function : Independent/Modified Independent                     Hand Dominance        Extremity/Trunk Assessment    Upper Extremity Assessment Upper Extremity Assessment: Overall WFL for tasks assessed    Lower Extremity Assessment Lower Extremity Assessment: Overall WFL for tasks assessed       Communication   Communication: No difficulties  Cognition Arousal/Alertness: Awake/alert Behavior During Therapy: WFL for tasks assessed/performed Overall Cognitive Status: Within Functional Limits for tasks assessed                                          General Comments      Exercises General Exercises - Lower Extremity Ankle Circles/Pumps: AROM, Both, 15 reps, Supine   Assessment/Plan    PT Assessment Patient needs continued PT services  PT Problem List Decreased strength;Decreased range of motion;Decreased activity tolerance;Decreased balance;Decreased mobility;Decreased knowledge of use of DME;Pain;Obesity       PT Treatment Interventions DME instruction;Gait training;Stair training;Functional mobility training;Therapeutic activities;Therapeutic exercise;Patient/family education;Balance training    PT Goals (Current goals can be found in the Care Plan section)  Acute Rehab PT Goals Patient Stated Goal: Regain IND PT Goal Formulation: With patient Time For Goal Achievement: 09/01/21 Potential to Achieve Goals: Good    Frequency Min 3X/week     Co-evaluation               AM-PAC PT "6 Clicks" Mobility  Outcome Measure Help needed turning from your back to your side while in a flat bed without using bedrails?: A Lot Help needed moving from lying on your back to sitting on the side of a flat bed without using bedrails?: A Lot Help needed moving to and from a bed to a chair (including a wheelchair)?: A Little Help needed standing up from a chair using your arms (e.g., wheelchair or bedside chair)?: A Little Help needed to walk in hospital room?: A Little Help needed climbing 3-5 steps with a railing? : A Lot 6 Click Score: 15    End of Session Equipment  Utilized During Treatment: Gait belt Activity Tolerance: Patient limited by pain;Patient limited by fatigue Patient left: Other (comment) (sitting EOB) Nurse Communication: Mobility status PT Visit Diagnosis: Unsteadiness on feet (R26.81);Difficulty in walking, not elsewhere classified (R26.2);Pain Pain - part of body:  (abdomen)    Time: [id number] PT Time Calculation (min) (ACUTE ONLY): 17 min   Charges:   PT Evaluation $PT Eval Low Complexity: 1 Low          [place] Pager [phone] Office [phone]   Loni Delbridge 08/18/2021, 4:03 PM

## 2021-08-18 NOTE — Discharge Instructions (Signed)
 VENTRAL HERNIA REPAIR POST OPERATIVE INSTRUCTIONS  Thinking Clearly  The anesthesia may cause you to feel different for 1 or 2 days. Do not drive, drink alcohol, or make any big decisions for at least 2 days.  Nutrition When you wake up, you will be able to drink small amounts of liquid. If you do not feel sick, you can slowly advance your diet to regular foods. Continue to drink lots of fluids, usually about 8 to 10 glasses per day. Eat a high-fiber diet so you don't strain during bowel movements. High-Fiber Foods Foods high in fiber include beans, bran cereals and whole-grain breads, peas, dried fruit (figs, apricots, and dates), raspberries, blackberries, strawberries, sweet corn, broccoli, baked potatoes with skin, plums, pears, apples, greens, and nuts. Activity Slowly increase your activity. Be sure to get up and walk every hour or so to prevent blood clots. No heavy lifting or strenuous activity for 4 weeks following surgery to prevent hernias at your incision sites or recurrence of your hernia. It is normal to feel tired. You may need more sleep than usual.  Get your rest but make sure to get up and move around frequently to prevent blood clots and pneumonia.  Work and Return to School You can go back to work when you feel well enough. Discuss the timing with your surgeon. You can usually go back to school or work 1 week or less after an laparoscopic or an open repair. If your work requires heavy lifting or strenuous activity you need to be placed on light duty for 4 weeks following surgery. You can return to gym class, sports or other physical activities 4 weeks after surgery.  Wound Care You may experience significant bruising throughout the abdominal wall that may track down into the groin including into the scrotum in males.  Rest, elevating the groin and scrotum above the level of the heart, ice and compression with tight fitting underwear or an abdominal binder can help.   Always wash your hands before and after touching near your incision site. Do not soak in a bathtub until cleared at your follow up appointment. You may take a shower 24 hours after surgery. A small amount of drainage from the incision is normal. If the drainage is thick and yellow or the site is red, you may have an infection, so call your surgeon. If you have a drain in one of your incisions, it will be taken out in office when the drainage stops. Steri-Strips will fall off in 7 to 10 days or they will be removed during your first office visit. If you have dermabond glue covering over the incision, allow the glue to flake off on its own. Protect the new skin, especially from the sun. The sun can burn and cause darker scarring. Your scar will heal in about 4 to 6 weeks and will become softer and continue to fade over the next year.  The cosmetic appearance of the incisions will improve over the course of the first year after surgery. Sensation around your incision will return in a few weeks or months.  Bowel Movements After intestinal surgery, you may have loose watery stools for several days. If watery diarrhea lasts longer than 3 days, contact your surgeon. Pain medication (narcotics) can cause constipation. Increase the fiber in your diet with high-fiber foods if you are constipated. You can take an over the counter stool softener like Colace to avoid constipation.  Additional over the counter medications can also be used   if Colace isn't sufficient (for example, Milk of Magnesia or Miralax).  Pain The amount of pain is different for each person. Some people need only 1 to 3 doses of pain control medication, while others need more. Take alternating doses of tylenol and ibuprofen around the clock for the first five days following surgery.  This will provide a baseline of pain control and help with inflammation.  Take the narcotic pain medication in addition if needed for severe pain.  Contact  Your Surgeon at [phone], if you have: Pain that will not go away Pain that gets worse A fever of more than 101F (38.3C) Repeated vomiting Swelling, redness, bleeding, or bad-smelling drainage from your wound site Strong abdominal pain No bowel movement or unable to pass gas for 3 days Watery diarrhea lasting longer than 3 days  Pain Control The goal of pain control is to minimize pain, keep you moving and help you heal. Your surgical team will work with you on your pain plan. Most often a combination of therapies and medications are used to control your pain. You may also be given medication (local anesthetic) at the surgical site. This may help control your pain for several days. Extreme pain puts extra stress on your body at a time when your body needs to focus on healing. Do not wait until your pain has reached a level "10" or is unbearable before telling your doctor or nurse. It is much easier to control pain before it becomes severe. Following a laparoscopic procedure, pain is sometimes felt in the shoulder. This is due to the gas inserted into your abdomen during the procedure. Moving and walking helps to decrease the gas and the right shoulder pain.  Use the guide below for ways to manage your post-operative pain. Learn more by going to facs.org/safepaincontrol.  How Intense Is My Pain Common Therapies to Feel Better       I hardly notice my pain, and it does not interfere with my activities.  I notice my pain and it distracts me, but I can still do activities (sitting up, walking, standing).  Non-Medication Therapies  Ice (in a bag, applied over clothing at the surgical site), elevation, rest, meditation, massage, distraction (music, TV, play) walking and mild exercise Splinting the abdomen with pillows +  Non-Opioid Medications Acetaminophen (Tylenol) Non-steroidal anti-inflammatory drugs (NSAIDS) Aspirin, Ibuprofen (Motrin, Advil) Naproxen (Aleve) Take these as  needed, when you feel pain. Both acetaminophen and NSAIDs help to decrease pain and swelling (inflammation).      My pain is hard to ignore and is more noticeable even when I rest.  My pain interferes with my usual activities.  Non-Medication Therapies  +  Non-Opioid medications  Take on a regular schedule (around-the-clock) instead of as needed. (For example, Tylenol every 6 hours at 9:00 am, 3:00 pm, 9:00 pm, 3:00 am and Motrin every 6 hours at 12:00 am, 6:00 am, 12:00 pm, 6:00 pm)         I am focused on my pain, and I am not doing my daily activities.  I am groaning in pain, and I cannot sleep. I am unable to do anything.  My pain is as bad as it could be, and nothing else matters.  Non-Medication Therapies  +  Around-the-Clock Non-Opioid Medications  +  Short-acting opioids  Opioids should be used with other medications to manage severe pain. Opioids block pain and give a feeling of euphoria (feel high). Addiction, a serious side effect of opioids, is   rare with short-term (a few days) use.  Examples of short-acting opioids include: Tramadol (Ultram), Hydrocodone (Norco, Vicodin), Hydromorphone (Dilaudid), Oxycodone (Oxycontin)     The above directions have been adapted from the American College of Surgeons Surgical Patient Education Program.  Please refer to the ACS website if needed: https://www.facs.org/-/media/files/education/patient-ed/ventral_hernia.ashx   Suda Forbess, MD [facility] [address], [address], [place], [place]  [address] ?  [address] [address], [place], [place]   [address] [phone] ? [phone] ? FAX [phone] Web site: [email or web address]  

## 2021-08-18 NOTE — TOC Initial Note (Signed)
Transition of Care [facility]) - Initial/Assessment Note   Patient Details  Name: Rita Bass MRN: [id number] Date of Birth: [birth year]  [facility]) CM/SW Contact:    Sherie Don, LCSW Phone Number: 08/18/2021, 3:57 PM  Clinical Narrative: Patient will need a rolling walker prior to discharging home. CSW spoke with patient and patient is agreeable to DME referral to [employer]. CSW made walker referral to [place] with [employer]. [employer] to deliver rolling walker to patient's room. DME order in.  Expected Discharge Plan: Home/Self Care Barriers to Discharge: No Barriers Identified  Patient Goals and CMS Choice CMS Medicare.gov Compare Post Acute Care list provided to:: Patient Choice offered to / list presented to : Patient  Expected Discharge Plan and Services Expected Discharge Plan: Home/Self Care In-house Referral: Clinical Social Work Post Acute Care Choice: Durable Medical Equipment           DME Arranged: [name] rolling DME Agency: [employer] Date DME Agency Contacted: 08/18/21 Time DME Agency Contacted: [phone] Representative spoke with at DME Agency: Andee Poles  Prior Living Arrangements/Services Patient language and need for interpreter reviewed:: Yes Need for Family Participation in Patient Care: No (Comment) Care giver support system in place?: Yes (comment) Criminal Activity/Legal Involvement Pertinent to Current Situation/Hospitalization: No - Comment as needed  Activities of Daily Living Home Assistive Devices/Equipment: None ADL Screening (condition at time of admission) Patient's cognitive ability adequate to safely complete daily activities?: Yes Is the patient deaf or have difficulty hearing?: No Does the patient have difficulty seeing, even when wearing glasses/contacts?: No Does the patient have difficulty concentrating, remembering, or making decisions?: No Patient able to express need for assistance with ADLs?: Yes Does the patient have difficulty  dressing or bathing?: No Independently performs ADLs?: Yes (appropriate for developmental age) Does the patient have difficulty walking or climbing stairs?: No Weakness of Legs: None Weakness of Arms/Hands: None  Emotional Assessment Attitude/Demeanor/Rapport: Engaged Affect (typically observed): Accepting Orientation: : Oriented to Self, Oriented to Place, Oriented to  Time Alcohol / Substance Use: Not Applicable  Admission diagnosis:  Ventral hernia [K43.9] Patient Active Problem List   Diagnosis Date Noted   Ventral hernia 08/16/2021   Allergic conjunctivitis 07/28/2021   Grade II internal hemorrhoids    Gastritis and gastroduodenitis    Lung nodule 10/26/2020   Ventral hernia without obstruction or gangrene 10/04/2020   Chronic diarrhea 09/16/2020   Abnormal Pap smear of cervix 08/18/2020   Hyperlipidemia associated with type 2 diabetes mellitus ([place]) 06/21/2020   Right hip pain 06/21/2020   Orthopnea 01/04/2020   Chronic cough 12/23/2019   Dyspepsia 05/22/2019   Essential hypertension 04/07/2019   Vaginal bleeding 01/27/2019   Low back pain 01/27/2019   BMI 50.0-59.9, adult ([place]) [id number]   Diastolic dysfunction with heart failure ([place]) 10/25/2017   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   Diabetic nephropathy associated with type 2 diabetes mellitus ([place]) 10/25/2017   Type 2 diabetes mellitus with complication ([place]) [id number]   Sleep apnea 08/16/2017   Chronic pain of right knee 07/13/2016   Morbid obesity ([place]) 02/04/2015   Menorrhagia with regular cycle 11/18/2014   PCP:  Lurline Del, DO Pharmacy:   [facility] [place], [place] [address] Phone: [phone] Fax: [phone]  [id number] - [place] [place] [address] [place] [address] Phone: [phone] Fax: [place]  STORE  [place], [place] [address] Phone: [phone] Fax: [phone]  Readmission Risk Interventions     View : No data to display.

## 2021-08-18 NOTE — Plan of Care (Signed)
?  Problem: Clinical Measurements: ?Goal: Ability to maintain clinical measurements within normal limits will improve ?Outcome: Progressing ?  ?Problem: Activity: ?Goal: Risk for activity intolerance will decrease ?Outcome: Progressing ?  ?Problem: Nutrition: ?Goal: Adequate nutrition will be maintained ?Outcome: Progressing ?  ?Problem: Pain Managment: ?Goal: General experience of comfort will improve ?Outcome: Progressing ?  ?

## 2021-08-19 LAB — CBC
HCT: 27.2 % — ABNORMAL LOW (ref 36.0–46.0)
Hemoglobin: 8.8 g/dL — ABNORMAL LOW (ref 12.0–15.0)
MCH: 25.6 pg — ABNORMAL LOW (ref 26.0–34.0)
MCHC: 32.4 g/dL (ref 30.0–36.0)
MCV: 79.1 fL — ABNORMAL LOW (ref 80.0–100.0)
Platelets: 226 10*3/uL (ref 150–400)
RBC: 3.44 MIL/uL — ABNORMAL LOW (ref 3.87–5.11)
RDW: 16.1 % — ABNORMAL HIGH (ref 11.5–15.5)
WBC: 11 10*3/uL — ABNORMAL HIGH (ref 4.0–10.5)
nRBC: 0 % (ref 0.0–0.2)

## 2021-08-19 LAB — BASIC METABOLIC PANEL
Anion gap: 6 (ref 5–15)
BUN: 7 mg/dL (ref 6–20)
CO2: 30 mmol/L (ref 22–32)
Calcium: 8.3 mg/dL — ABNORMAL LOW (ref 8.9–10.3)
Chloride: 102 mmol/L (ref 98–111)
Creatinine, Ser: 0.45 mg/dL (ref 0.44–1.00)
GFR, Estimated: >60 mL/min (ref >60)
Glucose, Bld: 131 mg/dL — ABNORMAL HIGH (ref 70–99)
Potassium: 3.4 mmol/L — ABNORMAL LOW (ref 3.5–5.1)
Sodium: 138 mmol/L (ref 135–145)

## 2021-08-19 LAB — GLUCOSE, CAPILLARY
Glucose-Capillary: 123 mg/dL — ABNORMAL HIGH (ref 70–99)
Glucose-Capillary: 143 mg/dL — ABNORMAL HIGH (ref 70–99)
Glucose-Capillary: 161 mg/dL — ABNORMAL HIGH (ref 70–99)
Glucose-Capillary: 200 mg/dL — ABNORMAL HIGH (ref 70–99)

## 2021-08-19 MED ORDER — POLYETHYLENE GLYCOL 3350 17 G PO PACK
17.0000 g | PACK | Freq: Every day | ORAL | Status: DC
  Administered 2021-08-20 – 2021-08-21 (×2): 17 g via ORAL
  Filled 2021-08-19 (×3): qty 1

## 2021-08-19 MED ORDER — BISACODYL 5 MG PO TBEC
5.0000 mg | DELAYED_RELEASE_TABLET | Freq: Every day | ORAL | Status: DC | PRN
  Administered 2021-08-19: 5 mg via ORAL
  Filled 2021-08-19: qty 1

## 2021-08-19 NOTE — Progress Notes (Signed)
Physical Therapy Treatment Patient Details Name: Rita Bass MRN: [id number] DOB: [birth year] Today's Date: 08/19/2021   History of Present Illness Pt s/p umbilical hernia repair 08/16/21 and with hx of DM, L knee pain and morbid obesity    PT Comments    Pt very pleasant and cooperative and progressing steadily with mobility.  Pt hopeful for dc home Monday.  Recommendations for follow up therapy are one component of a multi-disciplinary discharge planning process, led by the attending physician.  Recommendations may be updated based on patient status, additional functional criteria and insurance authorization.  Follow Up Recommendations  No PT follow up     Assistance Recommended at Discharge Frequent or constant Supervision/Assistance  Patient can return home with the following A little help with walking and/or transfers;A lot of help with bathing/dressing/bathroom;Assist for transportation;Help with stairs or ramp for entrance   Equipment Recommendations  Rolling walker (2 wheels)    Recommendations for Other Services       Precautions / Restrictions Precautions Precautions: Fall;Other (comment) Precaution Comments: Post abdominal surgery Restrictions Weight Bearing Restrictions: No     Mobility  Bed Mobility               General bed mobility comments: Pt up in chair and requests back to same.  Pt plans to sleep in lift chair initially on returning home    Transfers Overall transfer level: Needs assistance Equipment used: Rolling walker (2 wheels) Transfers: Sit to/from Stand Sit to Stand: Min guard, Supervision           General transfer comment: Pt self cues for use of UEs to self assist    Ambulation/Gait Ambulation/Gait assistance: Min guard, Supervision Gait Distance (Feet): 200 Feet Assistive device: Rolling walker (2 wheels) Gait Pattern/deviations: Step-to pattern, Step-through pattern, Decreased step length - right, Decreased step  length - left, Shuffle, Trunk flexed Gait velocity: decr     General Gait Details: Increased time with cues for posture and position from [employer]             Wheelchair Mobility    Modified Rankin (Stroke Patients Only)       Balance Overall balance assessment: Mild deficits observed, not formally tested Sitting-balance support: No upper extremity supported, Feet supported Sitting balance-Leahy Scale: Good     Standing balance support: No upper extremity supported Standing balance-Leahy Scale: Fair                              Cognition Arousal/Alertness: Awake/alert Behavior During Therapy: WFL for tasks assessed/performed Overall Cognitive Status: Within Functional Limits for tasks assessed                                          Exercises      General Comments        Pertinent Vitals/Pain Pain Assessment Pain Assessment: 0-10 Pain Score: 4  Pain Location: abdomen with mobilization Pain Descriptors / Indicators: Aching, Sore, Grimacing Pain Intervention(s): Limited activity within patient's tolerance, Monitored during session, Premedicated before session    Home Living                          Prior Function            PT Goals (current goals can now be  found in the care plan section) Acute Rehab PT Goals Patient Stated Goal: Regain IND PT Goal Formulation: With patient Time For Goal Achievement: 09/01/21 Potential to Achieve Goals: Good Progress towards PT goals: Progressing toward goals    Frequency    Min 3X/week      PT Plan Current plan remains appropriate    Co-evaluation              AM-PAC PT "6 Clicks" Mobility   Outcome Measure  Help needed turning from your back to your side while in a flat bed without using bedrails?: A Lot Help needed moving from lying on your back to sitting on the side of a flat bed without using bedrails?: A Lot Help needed moving to and from a bed  to a chair (including a wheelchair)?: A Little Help needed standing up from a chair using your arms (e.g., wheelchair or bedside chair)?: A Little Help needed to walk in hospital room?: A Little Help needed climbing 3-5 steps with a railing? : A Lot 6 Click Score: 15    End of Session   Activity Tolerance: Patient limited by pain;Patient limited by fatigue Patient left: Other (comment) Nurse Communication: Mobility status PT Visit Diagnosis: Unsteadiness on feet (R26.81);Difficulty in walking, not elsewhere classified (R26.2);Pain     Time: 4098-1191 PT Time Calculation (min) (ACUTE ONLY): 19 min  Charges:  $Gait Training: 8-22 mins                     Mauro Kaufmann PT Acute Rehabilitation Services Pager [phone] Office [phone]    [facility] 08/19/2021, 3:43 PM

## 2021-08-19 NOTE — Progress Notes (Signed)
3 Days Post-Op   Subjective/Chief Complaint: Still has not moved her bowels or passed flatus. Pain only mild to moderate ambulating   Objective: Vital signs in last 24 hours: Temp:  [98.4 F (36.9 C)-99.3 F (37.4 C)] 99.1 F (37.3 C) (06/03 0401) Pulse Rate:  [99-110] 106 (06/03 0401) Resp:  [17-20] 17 (06/03 0401) BP: (100-110)/(66-89) 104/66 (06/03 0401) SpO2:  [94 %-100 %] 94 % (06/03 0430)    Intake/Output from previous day: 06/02 0701 - 06/03 0700 In: 340 [P.O.:340] Out: 600 [Urine:600] Intake/Output this shift: No intake/output data recorded.  Exam: Abdomen soft, obese, incisions look fine  Lab Results:  Recent Labs    08/18/21 0350 08/19/21 0358  WBC 9.7 11.0*  HGB 9.8* 8.8*  HCT 31.3* 27.2*  PLT 256 226   BMET Recent Labs    08/18/21 0350 08/19/21 0358  NA 139 138  K 3.6 3.4*  CL 104 102  CO2 29 30  GLUCOSE 136* 131*  BUN 9 7  CREATININE 0.53 0.45  CALCIUM 8.5* 8.3*   PT/INR No results for input(s): LABPROT, INR in the last 72 hours. ABG No results for input(s): PHART, HCO3 in the last 72 hours.  Invalid input(s): PCO2, PO2  Studies/Results: No results found.  Anti-infectives: Anti-infectives (From admission, onward)    Start     Dose/Rate Route Frequency Ordered Stop   08/16/21 0630  ceFAZolin (ANCEF) IVPB 3g/100 mL premix        3 g 200 mL/hr over 30 Minutes Intravenous On call to O.R. 08/16/21 1829 08/16/21 0839       Assessment/Plan: s/p Procedure(s) with comments: XI ROBOT ASSISTED RECURRENT UMBILICAL HERNIA REPAIR WITH MESH (N/A) - removal of intraperitoneal mesh  Will add Miralax.  If this doesn't help, will try a suppository Ambulate Repeat CBC in the morning  LOS: 1 day    Abigail Miyamoto 08/19/2021

## 2021-08-19 NOTE — TOC Transition Note (Signed)
Transition of Care [facility]) - CM/SW Discharge Note   Patient Details  Name: Rita Bass MRN: [id number] Date of Birth: 1966-02-14  [facility] CM/SW Contact:  Darleene Cleaver, LCSW Phone Number: 08/19/2021, 11:56 AM   Clinical Narrative:     Per note from yesterday's CSW, patient's walker was delivered yesterday from [employer].  CSW signing off, please reconsult if other social work needs arise.   Final next level of care: Home/Self Care Barriers to Discharge: Continued Medical Work up   Patient Goals and CMS Choice Patient states their goals for this hospitalization and ongoing recovery are:: Patient plans to return back home. CMS Medicare.gov Compare Post Acute Care list provided to:: Patient Choice offered to / list presented to : Patient  Discharge Placement                       Discharge Plan and Services In-house Referral: Clinical Social Work   Post Acute Care Choice: Durable Medical Equipment          DME Arranged: [name] rolling DME Agency: [employer] Date DME Agency Contacted: 08/18/21 Time DME Agency Contacted: 1600 Representative spoke with at DME Agency: Duwayne Heck            Social Determinants of Health (SDOH) Interventions     Readmission Risk Interventions     View : No data to display.

## 2021-08-19 NOTE — Evaluation (Addendum)
Occupational Therapy Evaluation Patient Details Name: Rita Bass MRN: [id number] DOB: 1965/12/09 Today's Date: 08/19/2021   History of Present Illness Pt s/p umbilical hernia repair [id number] and with hx of DM, L knee pain and morbid obesity   Clinical Impression   [name] is a 56 year old woman s/p abdominal surgery who presents with decreased activity tolerance and pain resulting in difficulties with ADLs. Therapist instructed patient and daughter in regards to compensatory strategies and provided patient with [facility] for LB dressing. Patient demonstrated ability to perform toilet transfer and toileting. Discussed bathroom safety and recommended either shower chair/tub bench. Patient had questions in regards to non-permanent shower bar and therapist provided an example and local retailer. Patient verbalized understanding of all instruction and safety recommendations. No further OT needs at this time.      Recommendations for follow up therapy are one component of a multi-disciplinary discharge planning process, led by the attending physician.  Recommendations may be updated based on patient status, additional functional criteria and insurance authorization.   Follow Up Recommendations  No OT follow up    Assistance Recommended at Discharge Intermittent Supervision/Assistance  Patient can return home with the following A little help with bathing/dressing/bathroom;Assistance with cooking/housework    Functional Status Assessment  Patient has had a recent decline in their functional status and demonstrates the ability to make significant improvements in function in a reasonable and predictable amount of time.  Equipment Recommendations  Tub/shower bench (provided with [facility])    Recommendations for Other Services       Precautions / Restrictions Precautions Precaution Comments: Post abdominal surgery Restrictions Weight Bearing Restrictions: No      Mobility Bed  Mobility               General bed mobility comments: up in chair    Transfers Overall transfer level: Needs assistance Equipment used: Rolling walker (2 wheels)               General transfer comment: supervision with ambulation in room with RW      Balance Overall balance assessment: Mild deficits observed, not formally tested                                         ADL either performed or assessed with clinical judgement   ADL Overall ADL's : Needs assistance/impaired Eating/Feeding: Independent   Grooming: Independent   Upper Body Bathing: Independent   Lower Body Bathing: Moderate assistance   Upper Body Dressing : Independent   Lower Body Dressing: Moderate assistance   Toilet Transfer: Supervision/safety;Grab bars;Regular Toilet;Rolling walker (2 wheels)   Toileting- Clothing Manipulation and Hygiene: Modified independent       Functional mobility during ADLs: Supervision/safety;Rolling walker (2 wheels)       Vision Patient Visual Report: No change from baseline       Perception     Praxis      Pertinent Vitals/Pain Pain Assessment Pain Assessment: Faces Faces Pain Scale: Hurts little more Pain Location: abdomen with mobilization Pain Descriptors / Indicators: Aching, Sore, Grimacing Pain Intervention(s): Monitored during session     Hand Dominance Right   Extremity/Trunk Assessment Upper Extremity Assessment Upper Extremity Assessment: Overall WFL for tasks assessed   Lower Extremity Assessment Lower Extremity Assessment: Overall WFL for tasks assessed   Cervical / Trunk Assessment Cervical / Trunk Assessment: Other exceptions Cervical / Trunk  Exceptions: body habitus   Communication Communication Communication: No difficulties   Cognition Arousal/Alertness: Awake/alert Behavior During Therapy: WFL for tasks assessed/performed Overall Cognitive Status: Within Functional Limits for tasks assessed                                        General Comments       Exercises     Shoulder Instructions      Home Living Family/patient expects to be discharged to:: Private residence Living Arrangements: Spouse/significant other;Children Available Help at Discharge: Family;Available 24 hours/day Type of Home: House Home Access: Stairs to enter [employer] of Steps: 3 Entrance Stairs-Rails: Right;Left;Can reach both Home Layout: One level     Bathroom Shower/Tub: Teacher, early years/pre: Handicapped height     Home Equipment: None   Additional Comments: daughter will be home to help her      Prior Functioning/Environment Prior Level of Function : Independent/Modified Independent               ADLs Comments: doesn't typically wear socks, uses slip on shoes        OT Problem List: Decreased activity tolerance;Pain;Obesity;Decreased knowledge of use of DME or AE      OT Treatment/Interventions:      OT Goals(Current goals can be found in the care plan section) Acute Rehab OT Goals OT Goal Formulation: All assessment and education complete, DC therapy  OT Frequency:      Co-evaluation              AM-PAC OT "6 Clicks" Daily Activity     Outcome Measure Help from another person eating meals?: None Help from another person taking care of personal grooming?: None Help from another person toileting, which includes using toliet, bedpan, or urinal?: None Help from another person bathing (including washing, rinsing, drying)?: A Lot Help from another person to put on and taking off regular upper body clothing?: None Help from another person to put on and taking off regular lower body clothing?: A Lot 6 Click Score: 20   End of Session Equipment Utilized During Treatment: Rolling walker (2 wheels)  Activity Tolerance: Patient tolerated treatment well Patient left: in chair;with call bell/phone within reach;with family/visitor  present  OT Visit Diagnosis: Pain                Time: [id number] OT Time Calculation (min): 25 min Charges:  OT General Charges $OT Visit: 1 Visit OT Evaluation $OT Eval Low Complexity: 1 Low  Rita Bass, OTR/L [place]  Office [phone] Pager: [phone]   Rita Bass 08/19/2021, 9:02 AM

## 2021-08-20 DIAGNOSIS — Z833 Family history of diabetes mellitus: Secondary | ICD-10-CM | POA: Diagnosis not present

## 2021-08-20 DIAGNOSIS — E1165 Type 2 diabetes mellitus with hyperglycemia: Secondary | ICD-10-CM | POA: Diagnosis present

## 2021-08-20 DIAGNOSIS — I10 Essential (primary) hypertension: Secondary | ICD-10-CM | POA: Diagnosis present

## 2021-08-20 DIAGNOSIS — E785 Hyperlipidemia, unspecified: Secondary | ICD-10-CM | POA: Diagnosis present

## 2021-08-20 DIAGNOSIS — G4733 Obstructive sleep apnea (adult) (pediatric): Secondary | ICD-10-CM | POA: Diagnosis present

## 2021-08-20 DIAGNOSIS — Z79899 Other long term (current) drug therapy: Secondary | ICD-10-CM | POA: Diagnosis not present

## 2021-08-20 DIAGNOSIS — K420 Umbilical hernia with obstruction, without gangrene: Secondary | ICD-10-CM | POA: Diagnosis present

## 2021-08-20 DIAGNOSIS — K219 Gastro-esophageal reflux disease without esophagitis: Secondary | ICD-10-CM | POA: Diagnosis present

## 2021-08-20 DIAGNOSIS — J45909 Unspecified asthma, uncomplicated: Secondary | ICD-10-CM | POA: Diagnosis present

## 2021-08-20 DIAGNOSIS — Z8249 Family history of ischemic heart disease and other diseases of the circulatory system: Secondary | ICD-10-CM | POA: Diagnosis not present

## 2021-08-20 DIAGNOSIS — Z6841 Body Mass Index (BMI) 40.0 and over, adult: Secondary | ICD-10-CM | POA: Diagnosis not present

## 2021-08-20 DIAGNOSIS — Z794 Long term (current) use of insulin: Secondary | ICD-10-CM | POA: Diagnosis not present

## 2021-08-20 DIAGNOSIS — D62 Acute posthemorrhagic anemia: Secondary | ICD-10-CM | POA: Diagnosis not present

## 2021-08-20 DIAGNOSIS — E1169 Type 2 diabetes mellitus with other specified complication: Secondary | ICD-10-CM | POA: Diagnosis present

## 2021-08-20 LAB — BASIC METABOLIC PANEL
Anion gap: 8 (ref 5–15)
BUN: 8 mg/dL (ref 6–20)
CO2: 31 mmol/L (ref 22–32)
Calcium: 8.6 mg/dL — ABNORMAL LOW (ref 8.9–10.3)
Chloride: 101 mmol/L (ref 98–111)
Creatinine, Ser: 0.43 mg/dL — ABNORMAL LOW (ref 0.44–1.00)
GFR, Estimated: >60 mL/min (ref >60)
Glucose, Bld: 133 mg/dL — ABNORMAL HIGH (ref 70–99)
Potassium: 3.6 mmol/L (ref 3.5–5.1)
Sodium: 140 mmol/L (ref 135–145)

## 2021-08-20 LAB — GLUCOSE, CAPILLARY
Glucose-Capillary: 134 mg/dL — ABNORMAL HIGH (ref 70–99)
Glucose-Capillary: 176 mg/dL — ABNORMAL HIGH (ref 70–99)
Glucose-Capillary: 167 mg/dL — ABNORMAL HIGH (ref 70–99)
Glucose-Capillary: 123 mg/dL — ABNORMAL HIGH (ref 70–99)

## 2021-08-20 LAB — CBC
HCT: 27.3 % — ABNORMAL LOW (ref 36.0–46.0)
Hemoglobin: 8.5 g/dL — ABNORMAL LOW (ref 12.0–15.0)
MCH: 25.1 pg — ABNORMAL LOW (ref 26.0–34.0)
MCHC: 31.1 g/dL (ref 30.0–36.0)
MCV: 80.8 fL (ref 80.0–100.0)
Platelets: 250 10*3/uL (ref 150–400)
RBC: 3.38 MIL/uL — ABNORMAL LOW (ref 3.87–5.11)
RDW: 16.4 % — ABNORMAL HIGH (ref 11.5–15.5)
WBC: 10.3 10*3/uL (ref 4.0–10.5)
nRBC: 0.2 % (ref 0.0–0.2)

## 2021-08-20 MED ORDER — ALBUTEROL SULFATE (2.5 MG/3ML) 0.083% IN NEBU
INHALATION_SOLUTION | RESPIRATORY_TRACT | Status: AC
  Administered 2021-08-20: 2.5 mg
  Filled 2021-08-20: qty 3

## 2021-08-20 MED ORDER — BISACODYL 10 MG RE SUPP
10.0000 mg | Freq: Once | RECTAL | Status: AC
  Administered 2021-08-20: 10 mg via RECTAL
  Filled 2021-08-20: qty 1

## 2021-08-20 MED ORDER — ALBUTEROL SULFATE (2.5 MG/3ML) 0.083% IN NEBU: 2.5000 mg | INHALATION_SOLUTION | RESPIRATORY_TRACT | Status: DC | PRN

## 2021-08-20 NOTE — Progress Notes (Signed)
Physical Therapy Treatment Patient Details Name: Rita Bass MRN: [id number] DOB: 27-Aug-1965 Today's Date: 08/20/2021   History of Present Illness Pt s/p umbilical hernia repair 08/16/21 and with hx of DM, L knee pain and morbid obesity    PT Comments    Pt continues cooperative and with slow steady improvement in activity tolerance and pain control.  This date, pt up to ambulate increased distance in hall and negotiated stairs with bil rails.     Recommendations for follow up therapy are one component of a multi-disciplinary discharge planning process, led by the attending physician.  Recommendations may be updated based on patient status, additional functional criteria and insurance authorization.  Follow Up Recommendations  No PT follow up     Assistance Recommended at Discharge Frequent or constant Supervision/Assistance  Patient can return home with the following A little help with walking and/or transfers;A lot of help with bathing/dressing/bathroom;Assist for transportation;Help with stairs or ramp for entrance   Equipment Recommendations  Rolling walker (2 wheels)    Recommendations for Other Services       Precautions / Restrictions Precautions Precautions: Fall;Other (comment) Precaution Comments: /p abdominal surgery Restrictions Weight Bearing Restrictions: No     Mobility  Bed Mobility               General bed mobility comments: Pt up in chair and requests back to same.  Pt plans to sleep in lift chair initially on returning home    Transfers Overall transfer level: Needs assistance Equipment used: Rolling walker (2 wheels) Transfers: Sit to/from Stand Sit to Stand: Min guard, Supervision           General transfer comment: Pt self cues for use of UEs to self assist    Ambulation/Gait Ambulation/Gait assistance: Min assist Gait Distance (Feet): 230 Feet Assistive device: 2 person hand held assist Gait Pattern/deviations: Step-to  pattern, Step-through pattern, Decreased step length - right, Decreased step length - left, Shuffle, Trunk flexed Gait velocity: decr     General Gait Details: Increased time with assist for balance.  Pt requesting to walk sans RW 2* bil tricep soreness.  Noted decreased stability without RW and pt admits to same   Stairs Stairs: Yes Stairs assistance: Min guard Stair Management: Two rails, Forwards, Step to pattern Number of Stairs: 3 General stair comments: Pt self cues for sequence   Wheelchair Mobility    Modified Rankin (Stroke Patients Only)       Balance Overall balance assessment: Mild deficits observed, not formally tested Sitting-balance support: No upper extremity supported, Feet supported Sitting balance-Leahy Scale: Good     Standing balance support: No upper extremity supported Standing balance-Leahy Scale: Fair                              Cognition Arousal/Alertness: Awake/alert Behavior During Therapy: WFL for tasks assessed/performed Overall Cognitive Status: Within Functional Limits for tasks assessed                                          Exercises      General Comments        Pertinent Vitals/Pain Pain Assessment Pain Assessment: 0-10 Pain Score: 4  Pain Location: abdomen with mobilization Pain Descriptors / Indicators: Aching, Sore, Grimacing Pain Intervention(s): Limited activity within patient's tolerance, Monitored during session, Premedicated before  session    Home Living                          Prior Function            PT Goals (current goals can now be found in the care plan section) Acute Rehab PT Goals Patient Stated Goal: Regain IND PT Goal Formulation: With patient Time For Goal Achievement: 09/01/21 Potential to Achieve Goals: Good Progress towards PT goals: Progressing toward goals    Frequency    Min 3X/week      PT Plan Current plan remains appropriate     Co-evaluation              AM-PAC PT "6 Clicks" Mobility   Outcome Measure  Help needed turning from your back to your side while in a flat bed without using bedrails?: A Lot Help needed moving from lying on your back to sitting on the side of a flat bed without using bedrails?: A Lot Help needed moving to and from a bed to a chair (including a wheelchair)?: A Little Help needed standing up from a chair using your arms (e.g., wheelchair or bedside chair)?: A Little Help needed to walk in hospital room?: A Little Help needed climbing 3-5 steps with a railing? : A Little 6 Click Score: 16    End of Session Equipment Utilized During Treatment: Gait belt Activity Tolerance: Patient limited by pain;Patient limited by fatigue Patient left: in chair Nurse Communication: Mobility status PT Visit Diagnosis: Unsteadiness on feet (R26.81);Difficulty in walking, not elsewhere classified (R26.2);Pain Pain - part of body:  (abdomen)     Time: 8891-6945 PT Time Calculation (min) (ACUTE ONLY): 15 min  Charges:  $Gait Training: 8-22 mins                     Mauro Kaufmann PT Acute Rehabilitation Services Pager [phone] Office [phone]    Eugenia Eldredge 08/20/2021, 2:01 PM

## 2021-08-20 NOTE — Progress Notes (Signed)
4 Days Post-Op   Subjective/Chief Complaint: Still no BM    Objective: Vital signs in last 24 hours: Temp:  [97.5 F (36.4 C)-97.9 F (36.6 C)] 97.9 F (36.6 C) (06/04 0549) Pulse Rate:  [83-93] 83 (06/04 0549) Resp:  [18] 18 (06/04 0549) BP: (103-107)/(65-79) 106/70 (06/04 0549) SpO2:  [94 %-97 %] 94 % (06/04 0549)    Intake/Output from previous day: 06/03 0701 - 06/04 0700 In: 1130 [P.O.:1130] Out: 650 [Urine:650] Intake/Output this shift: No intake/output data recorded.  Exam: Awake and alert Abdomen obese, soft, appropriately tender  Lab Results:  Recent Labs    08/19/21 0358 08/20/21 0336  WBC 11.0* 10.3  HGB 8.8* 8.5*  HCT 27.2* 27.3*  PLT 226 250   BMET Recent Labs    08/19/21 0358 08/20/21 0336  NA 138 140  K 3.4* 3.6  CL 102 101  CO2 30 31  GLUCOSE 131* 133*  BUN 7 8  CREATININE 0.45 0.43*  CALCIUM 8.3* 8.6*   PT/INR No results for input(s): LABPROT, INR in the last 72 hours. ABG No results for input(s): PHART, HCO3 in the last 72 hours.  Invalid input(s): PCO2, PO2  Studies/Results: No results found.  Anti-infectives: Anti-infectives (From admission, onward)    Start     Dose/Rate Route Frequency Ordered Stop   08/16/21 0630  ceFAZolin (ANCEF) IVPB 3g/100 mL premix        3 g 200 mL/hr over 30 Minutes Intravenous On call to O.R. 08/16/21 1751 08/16/21 0839       Assessment/Plan: s/p Procedure(s) with comments: XI ROBOT ASSISTED RECURRENT UMBILICAL HERNIA REPAIR WITH MESH (N/A) - removal of intraperitoneal mesh  Try suppos to get bowels moving.  Patient doesn't want to go home until she has had a BM  LOS: 1 day    Abigail Miyamoto 08/20/2021

## 2021-08-21 LAB — BASIC METABOLIC PANEL
Anion gap: 7 (ref 5–15)
BUN: 9 mg/dL (ref 6–20)
CO2: 32 mmol/L (ref 22–32)
Calcium: 8.6 mg/dL — ABNORMAL LOW (ref 8.9–10.3)
Chloride: 101 mmol/L (ref 98–111)
Creatinine, Ser: 0.42 mg/dL — ABNORMAL LOW (ref 0.44–1.00)
GFR, Estimated: >60 mL/min (ref >60)
Glucose, Bld: 144 mg/dL — ABNORMAL HIGH (ref 70–99)
Potassium: 3.8 mmol/L (ref 3.5–5.1)
Sodium: 140 mmol/L (ref 135–145)

## 2021-08-21 LAB — CBC
HCT: 26.9 % — ABNORMAL LOW (ref 36.0–46.0)
Hemoglobin: 8.4 g/dL — ABNORMAL LOW (ref 12.0–15.0)
MCH: 25.2 pg — ABNORMAL LOW (ref 26.0–34.0)
MCHC: 31.2 g/dL (ref 30.0–36.0)
MCV: 80.8 fL (ref 80.0–100.0)
Platelets: 289 10*3/uL (ref 150–400)
RBC: 3.33 MIL/uL — ABNORMAL LOW (ref 3.87–5.11)
RDW: 16.3 % — ABNORMAL HIGH (ref 11.5–15.5)
WBC: 9.2 10*3/uL (ref 4.0–10.5)
nRBC: 0.3 % — ABNORMAL HIGH (ref 0.0–0.2)

## 2021-08-21 LAB — GLUCOSE, CAPILLARY
Glucose-Capillary: 135 mg/dL — ABNORMAL HIGH (ref 70–99)
Glucose-Capillary: 162 mg/dL — ABNORMAL HIGH (ref 70–99)

## 2021-08-21 MED ORDER — MAGNESIUM HYDROXIDE 400 MG/5ML PO SUSP
30.0000 mL | Freq: Every day | ORAL | Status: DC
  Administered 2021-08-21: 30 mL via ORAL
  Filled 2021-08-21 (×2): qty 30

## 2021-08-21 NOTE — Progress Notes (Signed)
Physical Therapy Treatment Patient Details Name: Rita Bass MRN: [id number] DOB: 05/27/1965 Today's Date: 08/21/2021   History of Present Illness Pt s/p umbilical hernia repair 08/16/21 and with hx of DM, L knee pain and morbid obesity    PT Comments    Pt continues to progress toward goals. Pt is motivated to mobilize and be more independent for return home. No f/u PT post acute.  Recommendations for follow up therapy are one component of a multi-disciplinary discharge planning process, led by the attending physician.  Recommendations may be updated based on patient status, additional functional criteria and insurance authorization.  Follow Up Recommendations  No PT follow up     Assistance Recommended at Discharge Frequent or constant Supervision/Assistance  Patient can return home with the following A little help with walking and/or transfers;A lot of help with bathing/dressing/bathroom;Assist for transportation;Help with stairs or ramp for entrance   Equipment Recommendations  Rolling walker (2 wheels)    Recommendations for Other Services       Precautions / Restrictions Precautions Precautions: Fall;Other (comment) Precaution Comments: s/p abdominal surgery Restrictions Weight Bearing Restrictions: No     Mobility  Bed Mobility               General bed mobility comments: Pt up in chair and requests back to same.  Pt plans to sleep in lift chair initially on returning home    Transfers Overall transfer level: Needs assistance Equipment used: Rolling walker (2 wheels) Transfers: Sit to/from Stand Sit to Stand: Supervision, Modified independent (Device/Increase time)           General transfer comment: Pt self cues for use of UEs to self assist    Ambulation/Gait Ambulation/Gait assistance: Min guard, Supervision Gait Distance (Feet): 240 Feet Assistive device: Rolling walker (2 wheels) Gait Pattern/deviations: Step-through pattern, Trunk  flexed, Decreased stride length Gait velocity: decr     General Gait Details: steadying assist when maneuvering around objects, pt without overt LOB   Stairs             Wheelchair Mobility    Modified [place] (Stroke Patients Only)       Balance Overall balance assessment: Mild deficits observed, not formally tested Sitting-balance support: No upper extremity supported, Feet supported Sitting balance-Leahy Scale: Good     Standing balance support: No upper extremity supported Standing balance-Leahy Scale: Fair                              Cognition Arousal/Alertness: Awake/alert Behavior During Therapy: WFL for tasks assessed/performed Overall Cognitive Status: Within Functional Limits for tasks assessed                                          Exercises      General Comments        Pertinent Vitals/Pain Pain Assessment Pain Assessment: 0-10 Faces Pain Scale: Hurts little more Pain Location: abdomen with mobilization Pain Descriptors / Indicators: Sore, Grimacing Pain Intervention(s): Limited activity within patient's tolerance, Monitored during session, Repositioned    Home Living                          Prior Function            PT Goals (current goals can now be found in the  care plan section) Acute Rehab PT Goals Patient Stated Goal: Regain IND PT Goal Formulation: With patient Time For Goal Achievement: 09/01/21 Potential to Achieve Goals: Good Progress towards PT goals: Progressing toward goals    Frequency    Min 3X/week      PT Plan Current plan remains appropriate    Co-evaluation              AM-PAC PT "6 Clicks" Mobility   Outcome Measure  Help needed turning from your back to your side while in a flat bed without using bedrails?: A Lot Help needed moving from lying on your back to sitting on the side of a flat bed without using bedrails?: A Lot Help needed moving to and from  a bed to a chair (including a wheelchair)?: A Little Help needed standing up from a chair using your arms (e.g., wheelchair or bedside chair)?: A Little Help needed to walk in hospital room?: A Little Help needed climbing 3-5 steps with a railing? : A Little 6 Click Score: 16    End of Session Equipment Utilized During Treatment: Gait belt Activity Tolerance: Patient limited by pain;Patient limited by fatigue Patient left: in chair;with call bell/phone within reach;with nursing/sitter in room Nurse Communication: Mobility status PT Visit Diagnosis: Unsteadiness on feet (R26.81);Difficulty in walking, not elsewhere classified (R26.2);Pain Pain - part of body:  (abdomen)     Time: 8938-1017 PT Time Calculation (min) (ACUTE ONLY): 15 min  Charges:  $Gait Training: 8-22 mins                     Delice Bison, PT  Acute Rehab Dept ([facility]) [phone] Pager [phone]  08/21/2021    [facility] 08/21/2021, 1:07 PM

## 2021-08-21 NOTE — Discharge Summary (Signed)
Patient ID: Rita Bass [id number] y.o. 11-Jun-1965  08/16/2021  Discharge date and time: 08/21/2021  Admitting Physician: [place]  Discharge Physician: [place]  Admission Diagnoses: Ventral hernia [K43.9] Patient Active Problem List   Diagnosis Date Noted   Ventral hernia 08/16/2021   Allergic conjunctivitis 07/28/2021   Grade II internal hemorrhoids    Gastritis and gastroduodenitis    Lung nodule 10/26/2020   Ventral hernia without obstruction or gangrene 10/04/2020   Chronic diarrhea 09/16/2020   Abnormal Pap smear of cervix 08/18/2020   Hyperlipidemia associated with type 2 diabetes mellitus ([place]) 06/21/2020   Right hip pain 06/21/2020   Orthopnea 01/04/2020   Chronic cough 12/23/2019   Dyspepsia 05/22/2019   Essential hypertension 04/07/2019   Vaginal bleeding 01/27/2019   Low back pain 01/27/2019   BMI 50.0-59.9, adult ([place]) [id number]   Diastolic dysfunction with heart failure ([place]) 10/25/2017   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   Diabetic nephropathy associated with type 2 diabetes mellitus ([place]) 10/25/2017   Type 2 diabetes mellitus with complication ([place]) 123456   Sleep apnea 08/16/2017   Chronic pain of right knee 07/13/2016   Morbid obesity ([place]) 02/04/2015   Menorrhagia with regular cycle 11/18/2014     Discharge Diagnoses: Ventral hernia Patient Active Problem List   Diagnosis Date Noted   Ventral hernia 08/16/2021   Allergic conjunctivitis 07/28/2021   Grade II internal hemorrhoids    Gastritis and gastroduodenitis    Lung nodule 10/26/2020   Ventral hernia without obstruction or gangrene 10/04/2020   Chronic diarrhea 09/16/2020   Abnormal Pap smear of cervix 08/18/2020   Hyperlipidemia associated with type 2 diabetes mellitus (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 Gelber presented and underwent robotic abdominal wall reconstruction.  She recovered well and was discharged.  Consults: None  Significant Diagnostic Studies: None  Treatments: surgery  Disposition: Home  Patient Instructions:  Allergies as of 08/21/2021   No Known Allergies      Medication List     TAKE these medications    Accu-Chek Guide test strip Generic drug: glucose blood TEST ONCE DAILY   Accu-Chek Softclix Lancets lancets Use as instructed   acetaminophen 650 MG CR tablet Commonly known as: TYLENOL Take 650-1,300 mg by mouth every 8 (eight) hours as needed for pain.   atorvastatin 40 MG tablet Commonly known as: LIPITOR Take 1 tablet (40 mg total) by mouth daily.   cetirizine 10 MG tablet Commonly known as: ZYRTEC Take 1 tablet (10 mg total) by mouth daily. What changed:  when to take this reasons to take this   fluticasone 50 MCG/ACT nasal spray Commonly known as: FLONASE Place 2 sprays into both nostrils daily. What changed:  when to take this reasons  to take this   lidocaine-prilocaine cream Commonly known as: EMLA Apply 1 application topically as needed.   liraglutide 18 MG/3ML Sopn Commonly known as: VICTOZA Inject 1.8 mg into the skin daily. Inject 1.8 mg into the abdomen once daily.    olopatadine 0.1 % ophthalmic solution Commonly known as: PATANOL Place 1 drop into both eyes 2 (two) times daily. What changed:  when to take this reasons to take this   omeprazole 20 MG capsule Commonly known as: PRILOSEC TAKE 1 CAPSULE(20 MG) BY MOUTH TWICE DAILY BEFORE A MEAL   ondansetron 4 MG tablet Commonly known as: ZOFRAN Take 1 tablet (4 mg total) by mouth every 8 (eight) hours as needed for nausea or vomiting.   oxyCODONE-acetaminophen 5-325 MG tablet Commonly known as: Percocet Take 1 tablet by mouth every 4 (four) hours as needed for severe pain.   Pen Needles 32G X 5 MM Misc Use as directed with victoza   traMADol 50 MG tablet Commonly known as: ULTRAM TAKE 1 TABLET(50 MG) BY MOUTH EVERY 12 HOURS AS NEEDED FOR MODERATE PAIN               Durable Medical Equipment  (From admission, onward)           Start     Ordered   08/18/21 1544  For home use only DME Walker rolling  Once       Question Answer Comment  Walker: With [place] Wheels   Patient needs a walker to treat with the following condition Mobility impaired      08/18/21 1544   08/18/21 1536  For home use only DME Walker rolling  Once       Question Answer Comment  Walker: With 5 Inch Wheels   Patient needs a walker to treat with the following condition Post-operative pain      08/18/21 1536            Activity: no heavy lifting for 4 weeks Diet: regular diet Wound Care: keep wound clean and dry  Follow-up:  With Dr. Thermon Leyland in 4 weeks.  Signed: Nickola Major Okechukwu [facility], [place]   08/21/2021, 1:12 PM

## 2021-08-22 ENCOUNTER — Telehealth: Payer: Self-pay

## 2021-08-22 ENCOUNTER — Encounter: Payer: Self-pay | Admitting: *Deleted

## 2021-08-22 NOTE — Telephone Encounter (Signed)
Transition Care Management Follow-up Telephone Call Date of discharge and from where: 08/21/2021 from [facility] Admitted How have you been since you were released from the hospital? Patient stated that she is feeling about the same. Patient did not have any questions or concerns at this time. Patient will contact surgeon with any questions.  Any questions or concerns? No  Items Reviewed: Did the pt receive and understand the discharge instructions provided? Yes  Medications obtained and verified? Yes  Other? No  Any new allergies since your discharge? No  Dietary orders reviewed? No Do you have support at home? Yes   Functional Questionnaire: (I = Independent and D = Dependent) ADLs: I  Bathing/Dressing- I  Meal Prep- I  Eating- I  Maintaining continence- I  Transferring/Ambulation- I  Managing Meds- I   Follow up appointments reviewed:  PCP Hospital f/u appt confirmed? Yes  Scheduled to see Jackelyn Poling, DO on 08/30/2021 Specialist Hospital f/u appt confirmed? Yes  Surgery Follow Up for  Are transportation arrangements needed? No  If their condition worsens, is the pt aware to call PCP or go to the Emergency Dept.? Yes Was the patient provided with contact information for the PCP's office or ED? Yes Was to pt encouraged to call back with questions or concerns? Yes

## 2021-08-23 ENCOUNTER — Telehealth: Payer: Self-pay

## 2021-08-23 NOTE — Telephone Encounter (Signed)
Patient calls nurse line in regards to elevated CBGs.   Patient reports she sugars "sky rocketed" while she was in the hospital after surgery.   Patient reports her home CBGs have been ~200s. Patient reports fasting CBG this morning was 171.  Patient reports she has been compliant with Victoza.   Patient denies any symptoms of hyperglycemia.   Patient has an apt with PCP on 6/14.  Patient advised to continue to monitor her CBGs.   Red flags discussed with patient.

## 2021-08-29 NOTE — Progress Notes (Unsigned)
    SUBJECTIVE:   CHIEF COMPLAINT / HPI:   Follow-up post umbilical hernia surgery: Patient had surgery on [id number] without complication.  She presents today for follow-up.  Today she states she generally feels better since the surgery but still has some surgery site pain.  She states that her generalized abdominal symptoms have improved.  She had some constipation in the hospital but this is getting better.  Her abdominal pain mostly hurts in the region where the mesh was installed and around her surgical sites.  Hemmorrhoids: Started while constipated in the hospital.  She did have some bright red blood on the tissue paper but has had a recent colonoscopy.  States that she has tried some Preparation H but it has not helped her symptoms dramatically.  States that the hemorrhoids hurt and itch.  She is taking MiraLAX to help soften her bowel movements.  PERTINENT  PMH / PSH: Recent laparoscopic abdominal surgery for hernia  OBJECTIVE:   BP 110/70   Pulse 67   Ht 5\' 4"  (1.626 m)   Wt 268 lb (121.6 kg)   LMP 04/05/2016   SpO2 98%   BMI 46.00 kg/m    General: NAD, pleasant, able to participate in exam Respiratory: No respiratory distress Abdomen: Bowel sounds present, mild abdominal discomfort to palpation which is generalized. Skin: 4 laparoscopic surgical sites on the abdomen which are well approximated with no erythema or drainage present. GU: Patient refused Neuro: alert, no obvious focal deficits Psych: Normal affect and mood  ASSESSMENT/PLAN:   Abdominal pain Pain is mostly around the surgical sites and in the region where she had mesh installed.  No signs of infection.  Surgical sites appear to be healing appropriately.  Discussed that we should not refill her Percocet particular given her constipation.  Ultimately decided to give a trial of gabapentin in addition to her current dose of tramadol.  Discussed with her that I would like to wean the tramadol down and hopefully  will stop it in the next 1 to 2 months.   Hemorrhoids: Started while in the hospital while she was constipated and receiving narcotics.  She has tried Preparation H without a lot of benefit.  We will try Anusol.  Discussed using stool softeners to have soft bowel movement daily.  Discussed doing physical exam but she requested holding off on this.  We will follow-up as needed.   Lurline Del, [place]

## 2021-08-30 ENCOUNTER — Encounter: Payer: Self-pay | Admitting: Family Medicine

## 2021-08-30 ENCOUNTER — Ambulatory Visit (INDEPENDENT_AMBULATORY_CARE_PROVIDER_SITE_OTHER): Payer: Medicaid Other | Admitting: Family Medicine

## 2021-08-30 VITALS — BP 110/70 | HR 67 | Ht 64.0 in | Wt 268.0 lb

## 2021-08-30 DIAGNOSIS — R109 Unspecified abdominal pain: Secondary | ICD-10-CM

## 2021-08-30 DIAGNOSIS — K644 Residual hemorrhoidal skin tags: Secondary | ICD-10-CM

## 2021-08-30 DIAGNOSIS — R1084 Generalized abdominal pain: Secondary | ICD-10-CM | POA: Insufficient documentation

## 2021-08-30 MED ORDER — GABAPENTIN 100 MG PO CAPS: 100.0000 mg | ORAL_CAPSULE | Freq: Three times a day (TID) | ORAL | 3 refills | Status: DC

## 2021-08-30 MED ORDER — HYDROCORTISONE (PERIANAL) 2.5 % EX CREA
1.0000 | TOPICAL_CREAM | Freq: Two times a day (BID) | CUTANEOUS | 0 refills | Status: DC
Start: 2021-08-30 — End: 2021-09-05

## 2021-08-30 NOTE — Assessment & Plan Note (Signed)
Pain is mostly around the surgical sites and in the region where she had mesh installed.  No signs of infection.  Surgical sites appear to be healing appropriately.  Discussed that we should not refill her Percocet particular given her constipation.  Ultimately decided to give a trial of gabapentin in addition to her current dose of tramadol.  Discussed with her that I would like to wean the tramadol down and hopefully will stop it in the next 1 to 2 months.

## 2021-08-30 NOTE — Patient Instructions (Signed)
We are starting gabapentin for your abdominal pain in addition to the tramadol.  My goal will be to stop the tramadol in the next 1 to 2 months.  The gabapentin we will start with 100 mg 3 times per day.  I want you to try this for a week or 2 and let me know if it is not helping and we can increase the dose if needed.  This medicine can make you sleepy.  We are also prescribing a medication to use for your hemorrhoids.  I recommend continuing stool softeners in order to have a soft bowel movement each day.  If this is not helping or if you have any other concerns please let us know.

## 2021-08-31 ENCOUNTER — Telehealth: Payer: Self-pay

## 2021-08-31 NOTE — Telephone Encounter (Signed)
Patient LVM on nurse line regarding issues with pain medication management. Attempted to return call to patient. Patient did not answer, LVM for patient to return call to office to further discuss issue.   Veronda Prude, RN

## 2021-09-01 NOTE — Telephone Encounter (Signed)
Patient calls nurse line regarding issues with picking up tramadol prescription.   Called pharmacy. Patient is locked in with specific provider for controlled substances through medicaid. Currently patient has updated provider to Dr. Atha Starks. Patient was able to pick up partial prescription from original prescription on 5/26. Pharmacist is not able to give exact dates and quantity due to prescription being "locked out" Verified with pharmacist that remainder of script could not be filled.   Patient is asking that Dr. Atha Starks write a new prescription for medication to allow her to be able to pick up and is also requesting an increase in frequency. Patient reports taking this medication every 6 hours in the past. Advised that I would send message to PCP, as previous note indicates goal is to lower this medication.   Please advise.   Rita Prude, RN

## 2021-09-03 ENCOUNTER — Other Ambulatory Visit: Payer: Self-pay | Admitting: Family Medicine

## 2021-09-03 ENCOUNTER — Encounter: Payer: Self-pay | Admitting: Family Medicine

## 2021-09-03 MED ORDER — TRAMADOL HCL 50 MG PO TABS: 50.0000 mg | ORAL_TABLET | Freq: Two times a day (BID) | ORAL | 0 refills | Status: DC | PRN

## 2021-09-05 ENCOUNTER — Other Ambulatory Visit: Payer: Self-pay | Admitting: Family Medicine

## 2021-09-07 ENCOUNTER — Ambulatory Visit: Payer: Medicaid Other | Admitting: Family Medicine

## 2021-09-07 ENCOUNTER — Encounter: Payer: Self-pay | Admitting: Family Medicine

## 2021-09-07 VITALS — Wt 268.0 lb

## 2021-09-07 DIAGNOSIS — R300 Dysuria: Secondary | ICD-10-CM

## 2021-09-07 DIAGNOSIS — R1084 Generalized abdominal pain: Secondary | ICD-10-CM

## 2021-09-07 DIAGNOSIS — D649 Anemia, unspecified: Secondary | ICD-10-CM

## 2021-09-07 LAB — POCT URINALYSIS DIP (MANUAL ENTRY)
Bilirubin, UA: NEGATIVE
Blood, UA: NEGATIVE
Glucose, UA: NEGATIVE mg/dL
Ketones, POC UA: NEGATIVE mg/dL
Leukocytes, UA: NEGATIVE
Nitrite, UA: NEGATIVE
Spec Grav, UA: 1.025 (ref 1.010–1.025)
Urobilinogen, UA: 0.2 E.U./dL
pH, UA: 5.5 (ref 5.0–8.0)

## 2021-09-07 MED ORDER — DULOXETINE HCL 20 MG PO CPEP: 20.0000 mg | ORAL_CAPSULE | Freq: Every day | ORAL | 0 refills | Status: DC

## 2021-09-07 MED ORDER — GABAPENTIN 100 MG PO CAPS: 200.0000 mg | ORAL_CAPSULE | Freq: Three times a day (TID) | ORAL | 3 refills | Status: DC

## 2021-09-07 NOTE — Assessment & Plan Note (Addendum)
Endorses that some of her abdominal symptoms have improved since the surgery but she has a pulling pain as well as a burning pain and some generalized sharp pains that occur from time to time.  Her bowels are moving appropriately.  There is no signs of superficial skin infection or laparoscopic incision sites.  Discussed the negatives associated with increasing her tramadol further.  Ultimately recommended increasing gabapentin with the plan to consider a TCA or SNRI as a next step and consideration for increasing tramadol only after these have been tried.  She is on board with this.  Will increase gabapentin to 200 mg 3 times a day with her having the go ahead to increase up to 300 mg 3 times a day if she needs while watching for drowsiness.  Discussed case with Dr. Leveda Anna and we will also add Cymbalta for her to start in about a month when she is titrated up to the 300 mg 3 times a day dose of gabapentin for extra pain control.  She is going to follow-up in about a month to meet her new PCP and to see how her pain is doing or sooner if she needs.  Discussed return precautions.

## 2021-09-07 NOTE — Progress Notes (Signed)
    SUBJECTIVE:   CHIEF COMPLAINT / HPI:   Dysuria: Started a few days ago.  Abdonminal pain: She thinks it has improved a bit since the surgery but now feels a pulling sensation, sometimes an occasional sharp pain in the stomach as well as sometimes burning sensation. These pains are generalized and not related to the incision sites.  Constipation: Has bowel movements twice per day. She continues to have issues with hemorrhoids that give her more trouble after bowel movements.   PERTINENT  PMH / PSH: History of constipation abdominal pain  OBJECTIVE:   Wt 268 lb (121.6 kg)   LMP 04/05/2016   BMI 46.00 kg/m    General: NAD, pleasant, able to participate in exam Respiratory: No respiratory distress ABD: Bowel sounds present, laparoscopic surgery sites are appropriately healed with no drainage or surrounding erythema.  She has generalized abdominal discomfort with palpation.  She does have positive suprapubic discomfort with palpation Psych: Normal affect and mood  ASSESSMENT/PLAN:   Generalized abdominal pain Endorses that some of her abdominal symptoms have improved since the surgery but she has a pulling pain as well as a burning pain and some generalized sharp pains that occur from time to time.  Her bowels are moving appropriately.  There is no signs of superficial skin infection or laparoscopic incision sites.  Discussed the negatives associated with increasing her tramadol further.  Ultimately recommended increasing gabapentin with the plan to consider a TCA or SNRI as a next step and consideration for increasing tramadol only after these have been tried.  She is on board with this.  Will increase gabapentin to 200 mg 3 times a day with her having the go ahead to increase up to 300 mg 3 times a day if she needs while watching for drowsiness.  Discussed case with Dr. Leveda Anna and we will also add Cymbalta for her to start in about a month when she is titrated up to the 300 mg 3  times a day dose of gabapentin for extra pain control.  She is going to follow-up in about a month to meet her new PCP and to see how her pain is doing or sooner if she needs.  Discussed return precautions.  Dysuria: Has been going on for a few days.  She also has suprapubic discomfort, however this is in the setting of generalized abdominal discomfort..  Urinalysis performed shows no leukocytes, negative nitrite, and is overall reassuring against a urinary tract infection.  We will continue to monitor this.  History of anemia: This occurred in setting of her surgery.  We will check CBC today.    Rita Poling, DO [facility]

## 2021-09-07 NOTE — Patient Instructions (Signed)
Your urine did not show any signs of urinary tract infection.  If you develop any other symptoms with regard to this please let us know.  Because you have had some anemia in the past during your surgery we should recheck your blood counts today and I will let you know those results when they return.  For your generalized abdominal pain were going to increase your gabapentin to 200 mg 3 times per day.  You have the ability to increase this further to 300 mg 3 times per day as long as the drowsiness does not become too much of an issue.  You should avoid driving while doing this as it can make you drowsy particularly when you start with it.  I want to keep the tramadol at the same dose.  I am also adding another prescription called duloxetine or Cymbalta for you to start in about 3 weeks to a month once you have gotten to a dose of gabapentin that gives you some assistance.  This medicine can give you extra pain control and can help [name] not have to go up on the narcotics.  I would like for you to come back and meet your new primary doctor in about a month to see how you are doing.  Feel free to come back sooner if you have any further concerns.

## 2021-09-08 LAB — CBC
Hematocrit: 35.7 % (ref 34.0–46.6)
Hemoglobin: 11.5 g/dL (ref 11.1–15.9)
MCH: 24.8 pg — ABNORMAL LOW (ref 26.6–33.0)
MCHC: 32.2 g/dL (ref 31.5–35.7)
MCV: 77 fL — ABNORMAL LOW (ref 79–97)
Platelets: 411 10*3/uL (ref 150–450)
RBC: 4.64 x10E6/uL (ref 3.77–5.28)
RDW: 15.6 % — ABNORMAL HIGH (ref 11.7–15.4)
WBC: 7.5 10*3/uL (ref 3.4–10.8)

## 2021-09-11 ENCOUNTER — Other Ambulatory Visit: Payer: Self-pay

## 2021-09-11 MED ORDER — HYDROCORTISONE (PERIANAL) 2.5 % EX CREA
TOPICAL_CREAM | CUTANEOUS | 0 refills | Status: DC
Start: 2021-09-11 — End: 2021-09-22

## 2021-09-15 ENCOUNTER — Other Ambulatory Visit: Payer: Self-pay

## 2021-09-15 ENCOUNTER — Ambulatory Visit (INDEPENDENT_AMBULATORY_CARE_PROVIDER_SITE_OTHER): Payer: Medicaid Other | Admitting: Family Medicine

## 2021-09-15 DIAGNOSIS — K644 Residual hemorrhoidal skin tags: Secondary | ICD-10-CM

## 2021-09-15 NOTE — Patient Instructions (Addendum)
It was great seeing you today!  Today we discussed your hemorrhoids, I am sorry that this has been causing you pain. I know that this has been an ongoing thing but unfortunately there is no change in treatment. Please continue to use the hydrocortisone cream and tucks pads. I know it is hard to get in the bathtub for a sitz bath so buying an insert to place over the toilet may help as well. Make sure that you continue to have a bowel movement at least once every 1-2 days, if you are not having regular stools then please continue the miralax.   If these are getting worse to the point where you cannot tolerate the pain even after doing all these things, then please come back to see [name]. We may consider surgery at this time.   Please follow up at your next scheduled appointment, if anything arises between now and then, please don't hesitate to contact our office.   Thank you for allowing [name] to be a part of your medical care!  Thank you, Dr. Robyne Peers

## 2021-09-15 NOTE — Assessment & Plan Note (Signed)
-  without active bleeding or thrombosis -reassurance provided -continue hydrocortisone cream and supportive care -encouraged to obtain container to place above toilet for sitz bath -continue current bowel regimen -follow up as appropriate with PCP, discussed with patient that further intervention remains limited and if she is not able to tolerate pain then may consider surgical intervention in the future, patient voiced understanding of this

## 2021-09-15 NOTE — Progress Notes (Signed)
    SUBJECTIVE:   CHIEF COMPLAINT / HPI:   Patient presents for follow up for hemorrhoids, still having pain especially after a BM. Using the hydrocortisone cream and preparation H cream which used to help and now still having pain. Had hernia, colon and bowel surgery at the end of May. Denies hematochezia, still healing from surgery. Uses tucks pads and sits on a pillow at home. Last BM yesterday afternoon, has been having soft BM daily. Had been using miralax.   OBJECTIVE:   BP 137/90   Pulse 88   Wt 271 lb 12.8 oz (123.3 kg)   LMP 04/05/2016   SpO2 98%   BMI 46.65 kg/m   General: Patient well-appearing, in no acute distress. CV: RRR, no murmurs or gallops auscultated Resp: CTAB, no wheezing, rales or rhonchi noted Abdomen: soft, nontender, presence of bowel sounds GU: external hemorrhoids noted below the dentate line with surrounding erythema noted, no active bleeding or thrombosis noted   GU exam performed in the presence of chaperone (Deseree Blount).   ASSESSMENT/PLAN:   External hemorrhoid -without active bleeding or thrombosis -reassurance provided -continue hydrocortisone cream and supportive care -encouraged to obtain container to place above toilet for sitz bath -continue current bowel regimen -follow up as appropriate with PCP, discussed with patient that further intervention remains limited and if she is not able to tolerate pain then may consider surgical intervention in the future, patient voiced understanding of this     Reece Leader, DO [facility]

## 2021-09-18 ENCOUNTER — Other Ambulatory Visit: Payer: Self-pay

## 2021-09-18 NOTE — Telephone Encounter (Signed)
Pt states that she recently saw her PCP and was given hydrocortisone cream for her external hemorrhoid and pt is requesting something stronger. Pt recently had a hernia surgery in May and became constipated after surgery. Pt states she started taking miralax and bowel movements are currently soft and she is doing regular sitz baths but the hemorrhoid is still very painful. Pt also states she is using wet wipes instead of toilet paper. Asked pt if she was using recticare and pt stated she was unable to find this at her pharmacy. Pt has appt scheduled with Colleen on 7/28 and wanted something to get her through until appointment. Dr. Leonides Schanz as DOD please advise.

## 2021-09-18 NOTE — Telephone Encounter (Signed)
Patient called would like to know if she can be given a stronger medication for the hemorrhoids.

## 2021-09-20 NOTE — Telephone Encounter (Signed)
Left message for pt to call back  °

## 2021-09-21 ENCOUNTER — Ambulatory Visit: Payer: Medicaid Other | Admitting: Family Medicine

## 2021-09-21 NOTE — Telephone Encounter (Signed)
Patient calls nurse line checking the status of medication refill request.   Please advise.

## 2021-09-21 NOTE — Progress Notes (Signed)
09/22/2021 Rita Bass [id number] [birth year]  Referring provider: Lincoln Brigham, MD Primary GI doctor: Dr. Barron Alvine  ASSESSMENT AND PLAN:   56 year old female with history of abnormal CT scan showing possible focal colitis, colonoscopy 05/11/2021 with Dr. Barron Alvine showed focal active colitis but normal architecture, grade 2 internal hemorrhoids. Worsening constipation after laparoscopic umbilical hernia repair 08/16/2021 Patient states she is having bowel movements daily off narcotics on tramadol but having severe rectal pain and rectal bleeding. Has done hydrocortisone cream Preparation H, witch hazel, RectiCare and sits baths without relief On examination patient had normal external rectal exam other than hemorrhoidal skin tags but too tender to evaluate internally and difficult to evaluate. Unknown if there was a rectal fissure that I am unable to see internal hemorrhoid should not cause rectal pain like this We will add on diltiazem/lidocaine and suppositories to see if this helps, needed to keep stools soft adding on MiraLAX 1/2-1 capful daily.. Scheduled for possible flex sigmoidoscopy with Dr. Barron Alvine, will discuss with him further, can potentially do this if patient is not improving or may need to discuss with surgery about doing EUA for possible rectal fissure if not improving  Orders Placed This Encounter  Procedures   CBC with Differential/Platelet   High sensitivity CRP   Sedimentation rate   Ambulatory referral to Gastroenterology     History of Present Illness:  56 y.o. female  with a past medical history of asthma, diabetes, GERD, sleep apnea, liver steatosis, internal and others listed below, returns to clinic today for evaluation of hemorrhoids.  05/11/2021 with Dr. Carloyn Manner, showed grade 2 internal hemorrhoids, foal active colitis post likely NSAIDS, meds Recall colon 10 years.  Patient continues to have hemorrhoidal issues.   She had laproscopic  umblical hernia repair  08/16/2021 with Dr. Dossie Der and became very constipated afterwards flaring her hemorrhoids.   She was having constipation,  She was taking MiraLAX 3 x a day, she is off narcotics, she is on tramadol twice a day. She is not on victoza at this time.  She is doing sitz bath's but still continues to have very painful hemorrhoids.    Her BM's are doing better, she is not on anything, she had BM 4 x yesterday, loose/soft stool.  She has rectal pain after BM with throbbing pain and burning.  Using hydrocortisone cream, Preparation H witch hazel RectiCare. Bleeding has improved, first 2 weeks was BRB in toliet now just on TP. No fever, chills.    Current Medications:   Current Outpatient Medications (Endocrine & Metabolic):    liraglutide (VICTOZA) 18 MG/3ML SOPN, Inject 1.8 mg into the skin daily. Inject 1.8 mg into the abdomen once daily. (Patient not taking: Reported on 09/22/2021)   Current Outpatient Medications (Respiratory):    cetirizine (ZYRTEC) 10 MG tablet, Take 1 tablet (10 mg total) by mouth daily. (Patient taking differently: Take 10 mg by mouth daily as needed for allergies.)   fluticasone (FLONASE) 50 MCG/ACT nasal spray, Place 2 sprays into both nostrils daily. (Patient not taking: Reported on 09/22/2021)  Current Outpatient Medications (Analgesics):    acetaminophen (TYLENOL) 650 MG CR tablet, Take 650-1,300 mg by mouth every 8 (eight) hours as needed for pain.   traMADol (ULTRAM) 50 MG tablet, Take 1 tablet (50 mg total) by mouth every 12 (twelve) hours as needed. Not to be refilled before 30 days   Current Outpatient Medications (Other):    AMBULATORY NON FORMULARY MEDICATION, Medication Name: Diltiazem 2%/Lidocaine 5%- Using your index  finger, apply a small amount of medication inside the rectum up to your first knuckle/joint three times daily x 8 weeks.   DULoxetine (CYMBALTA) 20 MG capsule, Take 1 capsule (20 mg total) by mouth daily.   gabapentin  (NEURONTIN) 100 MG capsule, Take 2 capsules (200 mg total) by mouth 3 (three) times daily. Can increase to 3 capsules (300 mg total) by mouth 3 times per day if needed   hydrocortisone (ANUSOL-HC) 2.5 % rectal cream, Place 1 Application rectally 2 (two) times daily.   NON FORMULARY, Diltiazem 2%/Lidocaine5% compound Use 3 x rectally daily for 2 months to heal anal fissure   ACCU-CHEK GUIDE test strip, TEST ONCE DAILY (Patient not taking: Reported on 09/22/2021)   Accu-Chek Softclix Lancets lancets, Use as instructed (Patient not taking: Reported on 09/22/2021)   Insulin Pen Needle (PEN NEEDLES) 32G X 5 MM MISC, Use as directed with victoza (Patient not taking: Reported on 09/22/2021)   olopatadine (PATANOL) 0.1 % ophthalmic solution, Place 1 drop into both eyes 2 (two) times daily. (Patient not taking: Reported on 09/22/2021)   omeprazole (PRILOSEC) 20 MG capsule, TAKE 1 CAPSULE(20 MG) BY MOUTH TWICE DAILY BEFORE A MEAL (Patient not taking: Reported on 09/22/2021)   ondansetron (ZOFRAN) 4 MG tablet, Take 1 tablet (4 mg total) by mouth every 8 (eight) hours as needed for nausea or vomiting. (Patient not taking: Reported on 09/22/2021)  Surgical History:  She  has a past surgical history that includes Oophorectomy; Cesarean section; Cholecystectomy; Hernia repair; Tubal ligation; Kidney stone surgery; transthoracic echocardiogram (09/2017); Esophagogastroduodenoscopy (egd) with propofol (N/A, 11/01/2020); biopsy (11/01/2020); Upper gastrointestinal endoscopy; Colonoscopy with propofol (N/A, 05/11/2021); and biopsy (05/11/2021). Family History:  Her family history includes Breast cancer in her mother; Diabetes in her father; Heart attack in her maternal aunt; Other in her father. Social History:   reports that she has never smoked. She has never used smokeless tobacco. She reports that she does not drink alcohol and does not use drugs.  Current Medications, Allergies, Past Medical History, Past Surgical History,  Family History and Social History were reviewed in [employer] record.  Physical Exam: BP 109/82   Pulse 70   Ht 5\' 4"  (1.626 m)   Wt 268 lb (121.6 kg)   LMP 04/05/2016   BMI 46.00 kg/m  General:   Pleasant, well developed female in no acute distress Heart : Regular rate and rhythm; no murmurs Pulm: Clear anteriorly; no wheezing Abdomen:  Soft, morbidly Obese AB, Active bowel sounds. mild tenderness in the entire abdomen.  Well-healed laparoscopic surgical scars Without guarding and Without rebound, No organomegaly appreciated. Rectal: External hemorrhoidal skin tags, 1 small flesh-colored tag left lateral possible prolapsed internal hemorrhoid, no visible fissures, increased rectal tone unable to evaluate internal exam due to exquisite tenderness, hemoccult N/A Extremities:  with  edema. Neurologic:  Alert and  oriented x4;  No focal deficits.  Psych:  Cooperative. Normal mood and affect.   04/07/2016, PA-C 09/22/21

## 2021-09-21 NOTE — Telephone Encounter (Signed)
Pt states she has been using prep H suppositories, tucks, recticare, sitz baths, and hydrocortisone cream provided by her PCP and nothing has helped. Pt requested a sooner appt. Pt scheduled with Quentin Mulling on 09/22/21 at 9 am. Pt also stated that PCP had said that hemorrhoids may require surgery.

## 2021-09-21 NOTE — Telephone Encounter (Signed)
Thank you for helping her in my recent absence and for setting up expedited appt with Marchelle Folks this week.

## 2021-09-22 ENCOUNTER — Ambulatory Visit: Payer: Medicaid Other | Admitting: Physician Assistant

## 2021-09-22 ENCOUNTER — Other Ambulatory Visit (INDEPENDENT_AMBULATORY_CARE_PROVIDER_SITE_OTHER): Payer: Medicaid Other

## 2021-09-22 ENCOUNTER — Encounter: Payer: Self-pay | Admitting: *Deleted

## 2021-09-22 VITALS — BP 109/82 | HR 70 | Ht 64.0 in | Wt 268.0 lb

## 2021-09-22 DIAGNOSIS — K625 Hemorrhage of anus and rectum: Secondary | ICD-10-CM | POA: Diagnosis not present

## 2021-09-22 DIAGNOSIS — K6289 Other specified diseases of anus and rectum: Secondary | ICD-10-CM

## 2021-09-22 DIAGNOSIS — Z9889 Other specified postprocedural states: Secondary | ICD-10-CM | POA: Diagnosis not present

## 2021-09-22 LAB — SEDIMENTATION RATE: Sed Rate: 40 mm/hr — ABNORMAL HIGH (ref 0–30)

## 2021-09-22 LAB — CBC WITH DIFFERENTIAL/PLATELET
Basophils Absolute: 0 10*3/uL (ref 0.0–0.1)
Basophils Relative: 0.6 % (ref 0.0–3.0)
Eosinophils Absolute: 0.6 10*3/uL (ref 0.0–0.7)
Eosinophils Relative: 9.6 % — ABNORMAL HIGH (ref 0.0–5.0)
HCT: 36.7 % (ref 36.0–46.0)
Hemoglobin: 11.9 g/dL — ABNORMAL LOW (ref 12.0–15.0)
Lymphocytes Relative: 30.7 % (ref 12.0–46.0)
Lymphs Abs: 1.9 10*3/uL (ref 0.7–4.0)
MCHC: 32.3 g/dL (ref 30.0–36.0)
MCV: 75.7 fl — ABNORMAL LOW (ref 78.0–100.0)
Monocytes Absolute: 0.5 10*3/uL (ref 0.1–1.0)
Monocytes Relative: 7.9 % (ref 3.0–12.0)
Neutro Abs: 3.2 10*3/uL (ref 1.4–7.7)
Neutrophils Relative %: 51.2 % (ref 43.0–77.0)
Platelets: 288 10*3/uL (ref 150.0–400.0)
RBC: 4.85 Mil/uL (ref 3.87–5.11)
RDW: 16.2 % — ABNORMAL HIGH (ref 11.5–15.5)
WBC: 6.2 10*3/uL (ref 4.0–10.5)

## 2021-09-22 LAB — HIGH SENSITIVITY CRP: CRP, High Sensitivity: 15.84 mg/L — ABNORMAL HIGH (ref 0.000–5.000)

## 2021-09-22 MED ORDER — HYDROCORTISONE (PERIANAL) 2.5 % EX CREA: TOPICAL_CREAM | CUTANEOUS | 0 refills | Status: DC

## 2021-09-22 MED ORDER — NON FORMULARY: 1 refills | Status: DC

## 2021-09-22 MED ORDER — HYDROCORTISONE (PERIANAL) 2.5 % EX CREA: 1.0000 | TOPICAL_CREAM | Freq: Two times a day (BID) | CUTANEOUS | 2 refills | Status: DC

## 2021-09-22 MED ORDER — AMBULATORY NON FORMULARY MEDICATION: 1 refills | Status: DC

## 2021-09-22 NOTE — Patient Instructions (Addendum)
You have been scheduled for a flexible sigmoidoscopy. Please follow written instructions given to you at your visit today.  Please pick up your prep supplies at the pharmacy within the next 1-3 days. If you use inhalers (even only as needed), please bring them with you on the day of your procedure.  Your provider has requested that you go to the basement level for lab work before leaving today. Press "B" on the elevator. The lab is located at the first door on the left as you exit the elevator.  Diltiazem/lidocaine 3 x daily for 2 months sent to compound pharmacy  If the hemorrhoid suppository sent in is too expensive you can do this over the counter trick.  Apply a pea size amount of over the counter Anusol HC/hydrocortisone cream to the tip of an over the counter PrepH suppository and insert rectally once every night for at least 7 nights.   Sent this medication to a compound pharmacy: [facility]  Please DO NOT go directly from our office to pick up this medication! Give the pharmacy 1 day to process the prescription. Extra time is required for them to compound your medication.  About Hemorrhoids  Hemorrhoids are swollen veins in the lower rectum and anus.  Also called piles, hemorrhoids are a common problem.  Hemorrhoids may be internal (inside the rectum) or external (around the anus).  Internal Hemorrhoids  Internal hemorrhoids are often painless, but they rarely cause bleeding.  The internal veins may stretch and fall down (prolapse) through the anus to the outside of the body.  The veins may then become irritated and painful.  External Hemorrhoids  External hemorrhoids can be easily seen or felt around the anal opening.  They are under the skin around the anus.  When the swollen veins are scratched or broken by straining, rubbing or wiping they sometimes bleed.  How Hemorrhoids Occur  Veins in the rectum and around the anus tend to swell under pressure.  Hemorrhoids can  result from increased pressure in the veins of your anus or rectum.  Some sources of pressure are:  Straining to have a bowel movement because of constipation Waiting too long to have a bowel movement Coughing and sneezing often Sitting for extended periods of time, including on the toilet Diarrhea Obesity Trauma or injury to the anus Some liver diseases Stress Family history of hemorrhoids Pregnancy  Pregnant women should try to avoid becoming constipated, because they are more likely to have hemorrhoids during pregnancy.  In the last trimester of pregnancy, the enlarged uterus may press on blood vessels and causes hemorrhoids.  In addition, the strain of childbirth sometimes causes hemorrhoids after the birth.  Symptoms of Hemorrhoids  Some symptoms of hemorrhoids include: Swelling and/or a tender lump around the anus Itching, mild burning and bleeding around the anus Painful bowel movements with or without constipation Bright red blood covering the stool, on toilet paper or in the toilet bowel.   Symptoms usually go away within a few days.  Always talk to your doctor about any bleeding to make sure it is not from some other causes.  Diagnosing and Treating Hemorrhoids  Diagnosis is made by an examination by your healthcare provider.  Special test can be performed by your doctor.    Most cases of hemorrhoids can be treated with: High-fiber diet: Eat more high-fiber foods, which help prevent constipation.  Ask for more detailed fiber information on types and sources of fiber from your healthcare provider. Fluids:  Drink plenty of water.  This helps soften bowel movements so they are easier to pass. Sitz baths and cold packs: Sitting in lukewarm water two or three times a day for 15 minutes cleases the anal area and may relieve discomfort.  If the water is too hot, swelling around the anus will get worse.  Placing a cloth-covered ice pack on the anus for ten minutes four times a day  can also help reduce selling.  Gently pushing a prolapsed hemorrhoid back inside after the bath or ice pack can be helpful. Medications: For mild discomfort, your healthcare provider may suggest over-the-counter pain medication or prescribe a cream or ointment for topical use.  The cream may contain witch hazel, zinc oxide or petroleum jelly.  Medicated suppositories are also a treatment option.  Always consult your doctor before applying medications or creams. Procedures and surgeries: There are also a number of procedures and surgeries to shrink or remove hemorrhoids in more serious cases.  Talk to your physician about these options.  You can often prevent hemorrhoids or keep them from becoming worse by maintaining a healthy lifestyle.  Eat a fiber-rich diet of fruits, vegetables and whole grains.  Also, drink plenty of water and exercise regularly.   2007, [employer] Doc.30

## 2021-09-25 ENCOUNTER — Encounter: Payer: Self-pay | Admitting: Family Medicine

## 2021-09-25 ENCOUNTER — Ambulatory Visit (INDEPENDENT_AMBULATORY_CARE_PROVIDER_SITE_OTHER): Payer: Medicaid Other | Admitting: Family Medicine

## 2021-09-25 DIAGNOSIS — R1084 Generalized abdominal pain: Secondary | ICD-10-CM | POA: Diagnosis not present

## 2021-09-25 DIAGNOSIS — K644 Residual hemorrhoidal skin tags: Secondary | ICD-10-CM | POA: Diagnosis not present

## 2021-09-25 MED ORDER — GABAPENTIN 100 MG PO CAPS: 400.0000 mg | ORAL_CAPSULE | Freq: Three times a day (TID) | ORAL | 3 refills | Status: DC

## 2021-09-25 MED ORDER — TRAMADOL HCL 50 MG PO TABS: 50.0000 mg | ORAL_TABLET | Freq: Two times a day (BID) | ORAL | 0 refills | Status: DC | PRN

## 2021-09-25 NOTE — Patient Instructions (Signed)
Good to see you today - Thank you for coming in!  Things we discussed today:  I will increase your gabapentin to 400mg  three times a day to better control your abdominal surgery pain. I will also send a refill for Tramadol.  Come back to see me in 2 months.

## 2021-09-25 NOTE — Assessment & Plan Note (Signed)
Reports ongoing pain with BM's. Following with GI. Reports that GI added topical diltiazem and lidocaine and has appt with GI Surg for potential band ligation.  - Cont topical hydrocortisone - Cont topical diltiazem/lidocaine - Cont Miralax

## 2021-09-25 NOTE — Assessment & Plan Note (Signed)
Reports ongoing abm pain after ventral repair (07/2021). Reports that gabapentin and tramadol are helping. Has 1-4 BM a day. On exam, abm nontender, nondistended.  - Increase gabapentin 300mg  TID to 400mg  TID - Cont tramadol 50mg  BID - Cont duloxetine 20mg  daily

## 2021-09-25 NOTE — Progress Notes (Signed)
    SUBJECTIVE:   CHIEF COMPLAINT / HPI:   DM is a 56yo F w/ PMHx of T2DM, hemorrhoids, and ventral hernia repair (07/2021) that p/f f/u of abdominal pain. Pt reports ongoing abm pain after hernia repair. Pain is "ripping" and sharp. Reports that gabapentin and tramadol help, but wants to increase gabapentin. Pt take miralax and has 1-4 BM daily.   Additionally, pt is following with GI for hemorrhoids. Pt reports ongoing sharp pain w/ BM's.  PERTINENT  PMH / PSH: as above  OBJECTIVE:   BP (!) 112/94   Pulse 80   Ht 5\' 4"  (1.626 m)   Wt 268 lb 4 oz (121.7 kg)   LMP 04/05/2016   SpO2 99%   BMI 46.05 kg/m   Gen: Well appearing obese woman, friendly. Pulm: Normal WOB Abm: Nontender, nondistended. 4 surgical incisions well healed and dry.   ASSESSMENT/PLAN:   Generalized abdominal pain Reports ongoing abm pain after ventral repair (07/2021). Reports that gabapentin and tramadol are helping. Has 1-4 BM a day. On exam, abm nontender, nondistended.  - Increase gabapentin 300mg  TID to 400mg  TID - Cont tramadol 50mg  BID - Cont duloxetine 20mg  daily  External hemorrhoid Reports ongoing pain with BM's. Following with GI. Reports that GI added topical diltiazem and lidocaine and has appt with GI Surg for potential band ligation.  - Cont topical hydrocortisone - Cont topical diltiazem/lidocaine - Cont Miralax   08-02-1978, MD [facility]

## 2021-09-26 ENCOUNTER — Telehealth: Payer: Self-pay

## 2021-09-26 DIAGNOSIS — R1084 Generalized abdominal pain: Secondary | ICD-10-CM

## 2021-09-26 NOTE — Telephone Encounter (Signed)
Sherrilee Gilles is not a medicaid provider yet. Patients insurance will not cover.   Will forward to preceptor to fill.   Please advise.

## 2021-09-26 NOTE — Telephone Encounter (Signed)
Reviewed PDMP. She typically receives 10 tablets, was increased to #60. This was to last 30 days. Also has other narcotics on medication list--she is not due for refill until 7/16 at earliest.   Will discuss with Dr. Sherrilee Gilles.  Terisa Starr, MD  Family Medicine Teaching Service

## 2021-09-26 NOTE — Telephone Encounter (Signed)
Gabapentin and Tramadol have been cancelled at the pharmacy.

## 2021-09-27 MED ORDER — GABAPENTIN 100 MG PO CAPS: 400.0000 mg | ORAL_CAPSULE | Freq: Three times a day (TID) | ORAL | 2 refills | Status: DC

## 2021-09-27 NOTE — Telephone Encounter (Signed)
Gabapentin refilled and sent to pharmacy.   Terisa Starr, MD  Family Medicine Teaching Service

## 2021-09-27 NOTE — Progress Notes (Signed)
Agree with the assessment and plan as outlined by Amanda Collier, PA-C. ? ?Ellis Koffler, DO, FACG ? ?

## 2021-09-27 NOTE — Telephone Encounter (Signed)
Patient calls nurse line again in regards to Tramadol and Gabapentin.   Patient advised Tramadol is too early to fill and will be addressed again in future.   Patient reports she needs the Gabapentin prescription sent in.  This needs to be sent in under an attending until PCP is approved by medicaid.   Will forward to back to preceptor.

## 2021-09-28 ENCOUNTER — Telehealth: Payer: Self-pay | Admitting: Family Medicine

## 2021-09-28 NOTE — Addendum Note (Signed)
Addended by: Quentin Mulling on: 09/28/2021 01:00 PM   Modules accepted: Orders

## 2021-09-28 NOTE — Telephone Encounter (Signed)
Pt contacted via phone. Clarified that tramadol was too early for refill, but gabapentin prescription was sent. Pt expressed understanding.

## 2021-10-02 ENCOUNTER — Other Ambulatory Visit: Payer: Self-pay | Admitting: Family Medicine

## 2021-10-02 ENCOUNTER — Telehealth: Payer: Self-pay | Admitting: *Deleted

## 2021-10-02 DIAGNOSIS — Z1231 Encounter for screening mammogram for malignant neoplasm of breast: Secondary | ICD-10-CM

## 2021-10-02 MED ORDER — TRAMADOL HCL 50 MG PO TABS: 50.0000 mg | ORAL_TABLET | Freq: Two times a day (BID) | ORAL | 0 refills | Status: DC | PRN

## 2021-10-02 MED ORDER — HYDROCORTISONE (PERIANAL) 2.5 % EX CREA: TOPICAL_CREAM | CUTANEOUS | 0 refills | Status: DC

## 2021-10-02 NOTE — Telephone Encounter (Signed)
Routing to Dr. Deirdre Priest as Dr. Sherrilee Gilles is not enrolled with medicaid currently ( this is being worked on). Jone Baseman, CMA

## 2021-10-05 ENCOUNTER — Ambulatory Visit: Payer: Medicaid Other | Admitting: Pharmacist

## 2021-10-05 ENCOUNTER — Encounter: Payer: Self-pay | Admitting: Pharmacist

## 2021-10-05 VITALS — BP 128/85 | HR 76 | Ht 64.0 in | Wt 271.6 lb

## 2021-10-05 DIAGNOSIS — E118 Type 2 diabetes mellitus with unspecified complications: Secondary | ICD-10-CM | POA: Diagnosis not present

## 2021-10-05 MED ORDER — OZEMPIC (1 MG/DOSE) 4 MG/3ML ~~LOC~~ SOPN: 1.0000 mg | PEN_INJECTOR | SUBCUTANEOUS | 11 refills | Status: DC

## 2021-10-05 NOTE — Assessment & Plan Note (Signed)
Diabetes longstanding currently controlled. Patient is able to verbalize appropriate hypoglycemia management plan. Medication adherence appears optimal.  -Discontinued GLP-1 Victoza (generic liraglutide). -Start GLP-1 Ozempic (generic semaglutide) 1 mg once weekly. -Patient educated on purpose, proper use, and potential adverse effects of Ozempic (semaglutide).  -Extensively discussed pathophysiology of diabetes, recommended lifestyle interventions, dietary effects on blood sugar control.  -Counseled on s/sx of and management of hypoglycemia.

## 2021-10-05 NOTE — Progress Notes (Signed)
Reviewed: I agree with Dr. Koval's documentation and management. 

## 2021-10-05 NOTE — Patient Instructions (Addendum)
It was nice to see you today!  Your goal blood sugar is 80-130 before eating and less than 180 after eating.  Medication Changes: Continue Victoza until you pick up Ozempic then STOP Victoza Begin Ozempic 1 mg once weekly   Monitor blood sugars at home and keep a log (glucometer or piece of paper) to bring with you to your next visit.  Keep up the good work with diet and exercise. Aim for a diet full of vegetables, fruit and lean meats (chicken, [place], fish). Try to limit salt intake by eating fresh or frozen vegetables (instead of canned), rinse canned vegetables prior to cooking and do not add any additional salt to meals.

## 2021-10-05 NOTE — Progress Notes (Signed)
S:    Chief Complaint  Patient presents with   Diabetes   Rita Bass is a 56 y.o. female who presents for diabetes evaluation, education, and management.  PMH is significant for T2DM, hemorrhoids, and ventral hernia repair (07/2021) .  Patient was referred by Dr. Atha Starks, on 10/04/20. Patient was last seen by primary care provider, Dr. Sherrilee Gilles, on 09/25/21.   Today, patient arrives in good spirits with her daughter Rita Bass) and presents without any assistance. She states her blood glucose control has been going well. A1c has continued to trend down, most recently 6.2. She is interested in losing more weight in order to qualify for a knee replacement surgery.    Current diabetes medications include: Victoza (liraglutide)  Current hyperlipidemia medications include: none (prescribed atorvastatin but not taking)   Patient reports adherence to taking all medications as prescribed.   Do you feel that your medications are working for you? yes Have you been experiencing any side effects to the medications prescribed? no Do you have any problems obtaining medications due to transportation or finances? no Insurance coverage: Medicaid - [employer]  Patient denies hypoglycemic events.  Reported home fasting blood sugars: 110s- 120s   Patient reports nocturia (nighttime urination). 1x/night every night  Patient denies neuropathy (nerve pain). Patient denies visual changes. Patient denies self foot exams.   Patient reported dietary habits: Eats 3 meals/day Breakfast: [employer]  Lunch: ham sandwich, no chips  Dinner: Airline pilot, potato Snacks: doesn't snack Drinks: water, diet Dr. [name]   Within the past 12 months, did you worry whether your food would run out before you got money to buy more? no Within the past 12 months, did the food you bought run out, and you didn't have money to get more? no  Patient-reported exercise habits: limited due to recent surgery  O:    Review of Systems  Gastrointestinal:  Positive for abdominal pain (improved post surgery). Negative for nausea and vomiting.  All other systems reviewed and are negative.  Physical Exam Constitutional:      Appearance: She is obese.  Neurological:     Mental Status: She is alert.  Psychiatric:        Mood and Affect: Mood normal.        Behavior: Behavior normal.    Lab Results  Component Value Date   HGBA1C 6.2 (H) 08/16/2021   Vitals:   10/05/21 1131  BP: 128/85  Pulse: 76  SpO2: 98%   Lipid Panel     Component Value Date/Time   CHOL 192 06/21/2020 1547   TRIG 131 06/21/2020 1547   HDL 54 06/21/2020 1547   CHOLHDL 3.6 06/21/2020 1547   LDLCALC 115 (H) 06/21/2020 1547   LDLDIRECT 117 (H) 02/28/2021 0917    Clinical Atherosclerotic Cardiovascular Disease (ASCVD): No  The 10-year ASCVD risk score (Arnett DK, et al., 2019) is: 3.1%   Values used to calculate the score:     Age: 56 years     Sex: Female     Is Non-Hispanic African American: No     Diabetic: Yes     Tobacco smoker: No     Systolic Blood Pressure: 128 mmHg     Is BP treated: No     HDL Cholesterol: 54 mg/dL     Total Cholesterol: 192 mg/dL   Patient is participating in a Managed Medicaid Plan:  Yes   A/P: Diabetes longstanding currently controlled. Patient is able to verbalize appropriate hypoglycemia  management plan. Medication adherence appears optimal.  -Discontinued GLP-1 Victoza (generic liraglutide). -Start GLP-1 Ozempic (generic semaglutide) 1 mg once weekly. -Patient educated on purpose, proper use, and potential adverse effects of Ozempic (semaglutide).  -Extensively discussed pathophysiology of diabetes, recommended lifestyle interventions, dietary effects on blood sugar control.  -Counseled on s/sx of and management of hypoglycemia.  -Next A1c anticipated at next visit.   Written patient instructions provided. Patient verbalized understanding of treatment plan.  Total time in face  to face counseling 32 minutes.    Follow-up:  Pharmacist 1 month. PCP clinic visit in September.  Patient seen with Valeda Malm, PharmD, PGY2 Pharmacy Resident.

## 2021-10-06 ENCOUNTER — Telehealth: Payer: Self-pay | Admitting: *Deleted

## 2021-10-06 ENCOUNTER — Other Ambulatory Visit (HOSPITAL_COMMUNITY): Payer: Self-pay

## 2021-10-06 NOTE — Telephone Encounter (Signed)
Patient called and stated she called her insurance and they stated they would pay for it but they are needing a PA and it would drop copay to 4 dollars.   Please advise.   Thanks!

## 2021-10-06 NOTE — Telephone Encounter (Signed)
Pt calling in stating her ozempic was $1000 when she went to the pharmacy to pick it up. Wants something different sent in that her medicaid will cover. Per dr Raymondo Band forward to you to see what is going on. Terrel Manalo Bruna Potter, CMA

## 2021-10-09 ENCOUNTER — Telehealth: Payer: Self-pay

## 2021-10-09 NOTE — Telephone Encounter (Signed)
A Prior Authorization was initiated & APPROVED for this patients OZEMPIC through CoverMyMeds.   Key: [id number]  APPROVED 10/09/21-10/09/22

## 2021-10-13 ENCOUNTER — Ambulatory Visit: Payer: Medicaid Other | Admitting: Nurse Practitioner

## 2021-10-15 ENCOUNTER — Encounter: Payer: Self-pay | Admitting: Certified Registered Nurse Anesthetist

## 2021-10-17 ENCOUNTER — Other Ambulatory Visit: Payer: Medicaid Other | Admitting: Gastroenterology

## 2021-10-18 DIAGNOSIS — G4733 Obstructive sleep apnea (adult) (pediatric): Secondary | ICD-10-CM | POA: Diagnosis not present

## 2021-10-23 ENCOUNTER — Ambulatory Visit: Payer: Medicaid Other | Admitting: Family Medicine

## 2021-10-27 ENCOUNTER — Other Ambulatory Visit: Payer: Self-pay

## 2021-10-27 MED ORDER — TRAMADOL HCL 50 MG PO TABS: 50.0000 mg | ORAL_TABLET | Freq: Two times a day (BID) | ORAL | 0 refills | Status: DC | PRN

## 2021-10-27 NOTE — Telephone Encounter (Signed)
Patient calls nurse line requesting refill on tramadol.   Dr. Sherrilee Gilles is not currently enrolled in [place]. Forwarding to Dr. Miquel Dunn- precepting provider for refill.   Veronda Prude, RN

## 2021-10-30 ENCOUNTER — Other Ambulatory Visit: Payer: Self-pay | Admitting: Family Medicine

## 2021-10-30 DIAGNOSIS — K6289 Other specified diseases of anus and rectum: Secondary | ICD-10-CM

## 2021-10-30 DIAGNOSIS — K625 Hemorrhage of anus and rectum: Secondary | ICD-10-CM

## 2021-10-30 MED ORDER — HYDROCORTISONE (PERIANAL) 2.5 % EX CREA: 1.0000 | TOPICAL_CREAM | Freq: Two times a day (BID) | CUTANEOUS | 2 refills | Status: DC

## 2021-11-01 ENCOUNTER — Telehealth: Payer: Self-pay

## 2021-11-01 NOTE — Telephone Encounter (Signed)
Received fax from pharmacy, PA needed on Tramadol.  Clinical questions submitted via Cover My Meds.  Waiting on response, could take up to 72 hours.  Cover My Meds info: Key: [id number]  Veronda Prude, RN

## 2021-11-02 ENCOUNTER — Ambulatory Visit (INDEPENDENT_AMBULATORY_CARE_PROVIDER_SITE_OTHER): Payer: Medicaid Other | Admitting: Pharmacist

## 2021-11-02 ENCOUNTER — Encounter: Payer: Self-pay | Admitting: Pharmacist

## 2021-11-02 DIAGNOSIS — E1121 Type 2 diabetes mellitus with diabetic nephropathy: Secondary | ICD-10-CM | POA: Diagnosis not present

## 2021-11-02 NOTE — Progress Notes (Signed)
In to see patient at request of Dr. Raymondo Band. 56 yo woman with history of hernia repair in May 2023 presenting with worsening of her abdominal pain. Has midline abdominal pain over prior surgical site and LUQ and RUQ pain at times. Endorses normal appetite. No nausea, vomiting, melena, or hematochezia. Has been having daily BM and is passing flatus. She reports ongoing frequent stools for some time that is unchanged.   Over last 24 hours has had congestion and cold like symptoms. No chest pain, dyspnea. Does endorse coughing. Denies urinary symptoms.   Vitals:   11/02/21 1535  BP: 118/67  Pulse: (!) 106  Temp: 100.2 F (37.9 C)  SpO2: 99%   Pleasant appropriate woman in no distresss Abdomen is soft, distended, mild TTP over prior surgical site no rebound or guarding.   Chronic abdominal pain, worsened since surgery. Do not suspect obstruction or cholecystitis (h/o cholecystectomy), no signs of nephrolithiasis or pyelonephritis (pain is periumbilical).  - Recommended follow up with Dr. Sherrilee Gilles and consideration of imaging given prior surgeries  - Discussed reasons to go to ED, discussed with patient if pain worsened should present to ED  Suspected viral URI, reports 1 day of symptoms, offered appointment in [facility] 8/18. Suspect viral etiology, consider pneumonia if worsens. Discussed recommendation with patient.

## 2021-11-02 NOTE — Patient Instructions (Signed)
It was nice to see you today!  Your goal blood sugar is 80-130 before eating and less than 180 after eating.  No medication changes today! Keep up the good work!  Monitor blood sugars at home and keep a log (glucometer or piece of paper) to bring with you to your next visit.  Keep up the good work with diet and exercise. Aim for a diet full of vegetables, fruit and lean meats (chicken, [place], fish). Try to limit salt intake by eating fresh or frozen vegetables (instead of canned), rinse canned vegetables prior to cooking and do not add any additional salt to meals.

## 2021-11-02 NOTE — Telephone Encounter (Signed)
Received approval for medication through 04/30/2022.  Called pharmacy and provided with update. $4 copay.   Attempted to call patient and provide with update. She did not answer, left VM asking patient to return call.   Veronda Prude, RN

## 2021-11-02 NOTE — Assessment & Plan Note (Signed)
Diabetes longstanding currently controlled based on home blood glucose readings. Patient is able to verbalize appropriate hypoglycemia management plan. Medication adherence appears appropriate. -Continued GLP-1 Ozempic (generic semaglutide) 1 mg weekly. May consider increasing at a later date but will defer today given gastric pain.  -Extensively discussed pathophysiology of diabetes, recommended lifestyle interventions, dietary effects on blood sugar control.

## 2021-11-02 NOTE — Progress Notes (Signed)
S:     Ozempic for 2 weeks - issues obtaining the medication due to cost.  Chief Complaint  Patient presents with   Medication Management    Diabetes   Rita Bass is a 56 y.o. female who presents for diabetes evaluation, education, and management. PMH is significant for T2DM, hemorrhoids, and ventral hernia repair (07/2021).  Patient was referred and last seen by Primary Care Provider, Dr. Sherrilee Gilles, on 09/25/21. At last pharmacy clinic visit, patient was switched from Victoza to Ozempic.   Today she presents with her daughter and without any assistance. She notes significant pain in her abdomen and feeling feverish. She describes the pain as sharp and internal to her abdomen. Reports 8/10 pain. Reports these symptoms were present prior to starting Ozempic. Denies changes in bowel habits since starting Ozempic.  Current diabetes medications include: Ozempic 1mg  weekly (started ~2 weeks ago)  Patient reports adherence to taking all medications as prescribed. She notes difficulty obtaining medication due to insurance issues.   Do you feel that your medications are working for you? yes Have you been experiencing any side effects to the medications prescribed? no Do you have any problems obtaining medications due to transportation or finances? Yes, insurance issues cause difficulty obtaining medications. Insurance coverage: Managed Medicaid  Patient denies hypoglycemic events.  Reported home fasting blood sugars: 100-120s. No readings >150 mg/dL   Patient reports nocturia (nighttime urination). Once nightly Patient denies neuropathy (nerve pain). Patient denies visual changes. Patient reports self foot exams.   Patient-reported exercise habits: Difficulty walking due to recent surgery and abdominal pain  O:  Review of Systems  Constitutional:  Positive for fever.  Gastrointestinal:  Positive for abdominal pain.   Physical Exam Constitutional:      Appearance: She is  obese. She is ill-appearing.  Pulmonary:     Effort: Pulmonary effort is normal.  Abdominal:     Tenderness: There is abdominal tenderness.  Neurological:     Mental Status: She is alert.  Psychiatric:        Behavior: Behavior normal.    Lab Results  Component Value Date   HGBA1C 6.2 (H) 08/16/2021   Vitals:   11/02/21 1535  BP: 118/67  Pulse: (!) 106  Temp: 100.2 F (37.9 C)  SpO2: 99%    Lipid Panel     Component Value Date/Time   CHOL 192 06/21/2020 1547   TRIG 131 06/21/2020 1547   HDL 54 06/21/2020 1547   CHOLHDL 3.6 06/21/2020 1547   LDLCALC 115 (H) 06/21/2020 1547   LDLDIRECT 117 (H) 02/28/2021 0917    Clinical Atherosclerotic Cardiovascular Disease (ASCVD): No  The 10-year ASCVD risk score (Arnett DK, et al., 2019) is: 2.9%   Values used to calculate the score:     Age: 47 years     Sex: Female     Is Non-Hispanic African American: No     Diabetic: Yes     Tobacco smoker: No     Systolic Blood Pressure: 118 mmHg     Is BP treated: No     HDL Cholesterol: 54 mg/dL     Total Cholesterol: 192 mg/dL   Patient is participating in a Managed Medicaid Plan:  Yes   A/P: Diabetes longstanding currently controlled based on home blood glucose readings. Patient is able to verbalize appropriate hypoglycemia management plan. Medication adherence appears appropriate. -Continued GLP-1 Ozempic (generic semaglutide) 1 mg weekly. May consider increasing at a later date but will defer today  given gastric pain.  -Extensively discussed pathophysiology of diabetes, recommended lifestyle interventions, dietary effects on blood sugar control.  -Counseled on s/sx of and management of hypoglycemia.  -Next A1c anticipated at next visit.   Multi-location gastric pain including left flank, right flank, and lower abdominal pain likely related multiple-GI surgery in past.  -Seen by attending Dr. Manson Passey who believes patient may have a hernia related to her recent surgery which will  likely require additional imaging.  - Plan to see Dr. Sherrilee Gilles on Monday (5 days from today).  -Patient instructed to go to ED with severe pain or fever.  Written patient instructions provided. Patient verbalized understanding of treatment plan.  Total time in face to face counseling 37 minutes.    Follow-up:  Pharmacist on 12/07/2021. PCP clinic visit on 11/06/2021.  Patient seen with Cherie Ouch, PharmD Candidate, Rennis Petty, PharmD PGY-1 Resident, and Valeda Malm, PharmD, PGY2 Pharmacy Resident.

## 2021-11-02 NOTE — Progress Notes (Signed)
Reviewed: I agree with Dr. Koval's documentation and management. 

## 2021-11-06 ENCOUNTER — Ambulatory Visit (INDEPENDENT_AMBULATORY_CARE_PROVIDER_SITE_OTHER): Payer: Medicaid Other | Admitting: Family Medicine

## 2021-11-06 ENCOUNTER — Encounter: Payer: Self-pay | Admitting: Family Medicine

## 2021-11-06 VITALS — BP 108/60 | HR 90 | Ht 63.0 in | Wt 274.0 lb

## 2021-11-06 DIAGNOSIS — R1033 Periumbilical pain: Secondary | ICD-10-CM | POA: Diagnosis not present

## 2021-11-06 DIAGNOSIS — R1084 Generalized abdominal pain: Secondary | ICD-10-CM

## 2021-11-06 DIAGNOSIS — E118 Type 2 diabetes mellitus with unspecified complications: Secondary | ICD-10-CM | POA: Diagnosis not present

## 2021-11-06 NOTE — Patient Instructions (Signed)
Good to see you today - Thank you for coming in  Things we discussed today:  I sent in a referral to see General Surgery for your abdominal pain.  - Continue taking your pain medications. They are safe to take with Dayquil and Nyquil.  Please seek emergency help if: - You are unable to have a bowel movement or pass gas for >48hrs - Have uncontrolled vomiting - Pain becomes too uncontrolled with home medications

## 2021-11-06 NOTE — Assessment & Plan Note (Signed)
Continues to have generalized abm pain, particularly in periumbilical, RUQ/flank, and L flank radiating down to L hip. Pain is stabbing. Also having nonbloody, watery diarrhea 4-5 times daily. Reports occasional RUQ "bulging mass", especially when standing. On exam, RUQ is more diffusely protruding compared to L, but no bulging mass. TTP periumbilical, RUQ, and L flank. Hepatomegaly noted. Given location of pain (near previous ventral hernia) and description of bulging mass from RUQ, this pain is likely related to prior hernia repair or a new RUQ hernia. Having BM's so no c/f obstruction at this time. Pain has been uncontrolled with medications. Will provide referral to see Gen surg again, and defer further imaging until Gen Surg evaluates.  - Gen Surgery Referral placed for evaluation of pain - Cont Gabapentin 400 TID - Tramadol 50mg  BID - Duloxetine 20mg  daily

## 2021-11-06 NOTE — Progress Notes (Signed)
    SUBJECTIVE:   CHIEF COMPLAINT / HPI:   DM is a 55yo F h/f ongoing abdominal pain. Pain is located in above umbilicus, R flank, and L flank radiating down to her L hip. Her periumbilical pain is bothering her the most and is described as "stabbing". She also describes a RUQ/R flank "bulging mass" that sometimes bulges out, especially when standing. Daughter accompanied to visit and she also agrees that she has seen the bulging RUQ mass. She reports that this pain has worsened since her surgery. She is also having nonbloody watery diarrhea 4-5 times a day, and seems to be triggered when she eats. The diarrhea has prevented her from going out to eat because she will immediately have diarrhea. Denies dysuria, hematuria.   PERTINENT  PMH / PSH:  External hemorrhoids  Ventral hernia repair (5/23) Cholecystectomy   OBJECTIVE:   BP 108/60   Pulse 90   Ht 5\' 3"  (1.6 m)   Wt 274 lb (124.3 kg)   LMP 04/05/2016   SpO2 95%   BMI 48.54 kg/m   Gen: [place]. NAD. Pulm: Normal WOB on RA CV: RRR, no murmurs Abm: Visually, RUQ more pronounced/protruding compared to LUQ, but no bulging mass. Tenderness to palpation superior to umbilicus (near prior ventral hernia). Tenderness to palpation over R flank. Mild L flank tenderness. Hepatomegaly palpated. Soft, nondistended. Normal BS.    ASSESSMENT/PLAN:   Type 2 diabetes mellitus with complication (HCC) Due for A1c and urine microalbumin. Plan to obtain at f/u in 1 month. Has been working with Dr. 11-09-1972 to optimize meds.  - Cont Ozempic 1mg  weekly  Generalized abdominal pain Continues to have generalized abm pain, particularly in periumbilical, RUQ/flank, and L flank radiating down to L hip. Pain is stabbing. Also having nonbloody, watery diarrhea 4-5 times daily. Reports occasional RUQ "bulging mass", especially when standing. On exam, RUQ is more diffusely protruding compared to L, but no bulging mass. TTP periumbilical, RUQ, and L flank.  Hepatomegaly noted. Given location of pain (near previous ventral hernia) and description of bulging mass from RUQ, this pain is likely related to prior hernia repair or a new RUQ hernia. Having BM's so no c/f obstruction at this time. Pain has been uncontrolled with medications. Will provide referral to see Gen surg again, and defer further imaging until Gen Surg evaluates.  - Gen Surgery Referral placed for evaluation of pain - Cont Gabapentin 400 TID - Tramadol 50mg  BID - Duloxetine 20mg  daily     Raymondo Band, MD [facility]

## 2021-11-06 NOTE — Assessment & Plan Note (Signed)
Due for A1c and urine microalbumin. Plan to obtain at f/u in 1 month. Has been working with Dr. Raymondo Band to optimize meds.  - Cont Ozempic 1mg  weekly

## 2021-11-08 ENCOUNTER — Telehealth: Payer: Self-pay

## 2021-11-08 NOTE — Telephone Encounter (Signed)
Pt stated that she needed to cancel appt for 11/09/21. Stated she has to care for her grandkids tomorrow. Pt did not want to reschedule today. She will call back to reschedule at a later time. Recall placed for 3 months.

## 2021-11-09 ENCOUNTER — Other Ambulatory Visit: Payer: Medicaid Other | Admitting: Gastroenterology

## 2021-11-10 ENCOUNTER — Other Ambulatory Visit: Payer: Self-pay

## 2021-11-11 ENCOUNTER — Telehealth: Payer: Self-pay | Admitting: *Deleted

## 2021-11-11 MED ORDER — DULOXETINE HCL 20 MG PO CPEP: 20.0000 mg | ORAL_CAPSULE | Freq: Every day | ORAL | 0 refills | Status: DC

## 2021-11-11 NOTE — Telephone Encounter (Signed)
Returned patient's call about her referral for general surgery.  I left a voicemail explaining to patient that she doesn't need a referral since she is currently established with them.  Per care everywhere she is already set up for a follow up appt with them on 11/14/21 at 10:30.  She can call their office is that doesn't work for them.  Burnard Hawthorne   Encounter Start Encounter End  11/14/2021 10:30 AM 11/14/2021 10:50 AM   Providers  Elenora Gamma, MD General Surgery NPI: [id number] [address] [place] [address]   Phone: [phone]

## 2021-11-21 ENCOUNTER — Ambulatory Visit: Payer: Medicaid Other

## 2021-11-30 ENCOUNTER — Other Ambulatory Visit: Payer: Self-pay

## 2021-11-30 MED ORDER — TRAMADOL HCL 50 MG PO TABS
50.0000 mg | ORAL_TABLET | Freq: Two times a day (BID) | ORAL | 0 refills | Status: DC | PRN
Start: 2021-11-30 — End: 2021-12-27

## 2021-12-07 ENCOUNTER — Ambulatory Visit: Payer: Medicaid Other | Admitting: Pharmacist

## 2021-12-11 ENCOUNTER — Ambulatory Visit: Payer: Medicaid Other | Admitting: Pharmacist

## 2021-12-18 ENCOUNTER — Ambulatory Visit: Payer: Medicaid Other | Admitting: Pharmacist

## 2021-12-18 ENCOUNTER — Ambulatory Visit: Payer: Medicaid Other

## 2021-12-21 DIAGNOSIS — Z9889 Other specified postprocedural states: Secondary | ICD-10-CM | POA: Diagnosis not present

## 2021-12-21 DIAGNOSIS — Z8719 Personal history of other diseases of the digestive system: Secondary | ICD-10-CM | POA: Diagnosis not present

## 2021-12-27 ENCOUNTER — Other Ambulatory Visit: Payer: Self-pay | Admitting: *Deleted

## 2021-12-27 DIAGNOSIS — R1084 Generalized abdominal pain: Secondary | ICD-10-CM

## 2021-12-28 ENCOUNTER — Other Ambulatory Visit: Payer: Self-pay | Admitting: Surgery

## 2021-12-28 ENCOUNTER — Other Ambulatory Visit: Payer: Self-pay | Admitting: Family Medicine

## 2021-12-28 DIAGNOSIS — Z8719 Personal history of other diseases of the digestive system: Secondary | ICD-10-CM

## 2021-12-28 MED ORDER — TRAMADOL HCL 50 MG PO TABS: 50.0000 mg | ORAL_TABLET | Freq: Two times a day (BID) | ORAL | 0 refills | Status: DC | PRN

## 2021-12-28 MED ORDER — GABAPENTIN 100 MG PO CAPS: 400.0000 mg | ORAL_CAPSULE | Freq: Three times a day (TID) | ORAL | 2 refills | Status: DC

## 2022-01-01 ENCOUNTER — Ambulatory Visit: Payer: Medicaid Other | Admitting: Pharmacist

## 2022-01-01 DIAGNOSIS — Z23 Encounter for immunization: Secondary | ICD-10-CM | POA: Diagnosis not present

## 2022-01-04 ENCOUNTER — Ambulatory Visit: Payer: Medicaid Other

## 2022-01-08 ENCOUNTER — Telehealth: Payer: Self-pay

## 2022-01-08 DIAGNOSIS — R21 Rash and other nonspecific skin eruption: Secondary | ICD-10-CM

## 2022-01-08 MED ORDER — NYSTATIN 100000 UNIT/GM EX POWD: 1.0000 | Freq: Two times a day (BID) | CUTANEOUS | 0 refills | Status: DC

## 2022-01-08 NOTE — Telephone Encounter (Signed)
Called pt and she reports that rash under breast is similar to prior. It reoccurs from time to time. It was previously improved with nystatin powder.

## 2022-01-08 NOTE — Telephone Encounter (Signed)
Patient calls nurse line requesting refill on Nystop for rash under breast. Medication is not on current med list.   Please advise.   Talbot Grumbling, RN

## 2022-01-08 NOTE — Telephone Encounter (Signed)
Pt called nurse line for Nystatin for rash under breast. Pt was previously on Nystatin for breast rash in 2019.  - Will provide refill of Nystatin powder - Pt has f/u on 10/30.

## 2022-01-11 ENCOUNTER — Ambulatory Visit
Admission: RE | Admit: 2022-01-11 | Discharge: 2022-01-11 | Disposition: A | Discharge status: Home / Self Care | Payer: Medicaid Other | Source: Ambulatory Visit | Attending: Surgery | Admitting: Surgery

## 2022-01-11 DIAGNOSIS — Z8719 Personal history of other diseases of the digestive system: Secondary | ICD-10-CM

## 2022-01-11 DIAGNOSIS — N261 Atrophy of kidney (terminal): Secondary | ICD-10-CM | POA: Diagnosis not present

## 2022-01-11 DIAGNOSIS — Z9049 Acquired absence of other specified parts of digestive tract: Secondary | ICD-10-CM | POA: Diagnosis not present

## 2022-01-11 DIAGNOSIS — N200 Calculus of kidney: Secondary | ICD-10-CM | POA: Diagnosis not present

## 2022-01-11 MED ORDER — IOPAMIDOL (ISOVUE-300) INJECTION 61%
100.0000 mL | Freq: Once | INTRAVENOUS | Status: AC | PRN
  Administered 2022-01-11: 100 mL via INTRAVENOUS

## 2022-01-15 ENCOUNTER — Ambulatory Visit: Payer: Medicaid Other | Admitting: Pharmacist

## 2022-01-15 ENCOUNTER — Ambulatory Visit: Payer: Medicaid Other | Admitting: Family Medicine

## 2022-01-15 ENCOUNTER — Encounter: Payer: Self-pay | Admitting: Family Medicine

## 2022-01-15 VITALS — BP 142/90 | HR 80 | Ht 63.0 in | Wt 288.4 lb

## 2022-01-15 VITALS — BP 148/84 | HR 82

## 2022-01-15 DIAGNOSIS — E118 Type 2 diabetes mellitus with unspecified complications: Secondary | ICD-10-CM

## 2022-01-15 DIAGNOSIS — I10 Essential (primary) hypertension: Secondary | ICD-10-CM

## 2022-01-15 DIAGNOSIS — E1121 Type 2 diabetes mellitus with diabetic nephropathy: Secondary | ICD-10-CM

## 2022-01-15 DIAGNOSIS — R21 Rash and other nonspecific skin eruption: Secondary | ICD-10-CM

## 2022-01-15 DIAGNOSIS — R1033 Periumbilical pain: Secondary | ICD-10-CM

## 2022-01-15 DIAGNOSIS — R1084 Generalized abdominal pain: Secondary | ICD-10-CM | POA: Diagnosis not present

## 2022-01-15 LAB — POCT GLYCOSYLATED HEMOGLOBIN (HGB A1C): HbA1c, POC (controlled diabetic range): 6.7 % (ref 0.0–7.0)

## 2022-01-15 MED ORDER — NYSTATIN 100000 UNIT/GM EX POWD: 1.0000 | Freq: Two times a day (BID) | CUTANEOUS | 0 refills | Status: DC

## 2022-01-15 MED ORDER — OZEMPIC (2 MG/DOSE) 8 MG/3ML ~~LOC~~ SOPN: 2.0000 mg | PEN_INJECTOR | SUBCUTANEOUS | 11 refills | Status: DC

## 2022-01-15 MED ORDER — LISINOPRIL 10 MG PO TABS: 10.0000 mg | ORAL_TABLET | Freq: Every day | ORAL | 3 refills | Status: DC

## 2022-01-15 MED ORDER — OZEMPIC (1 MG/DOSE) 4 MG/3ML ~~LOC~~ SOPN: 2.0000 mg | PEN_INJECTOR | SUBCUTANEOUS | 11 refills | Status: DC

## 2022-01-15 NOTE — Patient Instructions (Signed)
Good to see you today - Thank you for coming in  Things we discussed today:  1) For your abdominal pain, your CT scan did not show any obstructions. This is good! - Follow-up with your Surgeon  2) For your diabetes and weight, - Increase Ozempic to 2mg  weekly. Let us know if you have increased abdominal pain and we can go back to a lower dose. - I sent a referral for you to see an Ophthalmologist to get your eyes checked  3) Your blood pressure was high today - Let's restart your Lisinopril 10mg  daily  Please always bring your medication bottles  Come back to see me in 4-6 weeks.

## 2022-01-15 NOTE — Assessment & Plan Note (Signed)
A1c 6.7, at goal. Pt has 13lb weight gain since last visit. Plan to increase Ozempic for additional weight benefit. Advised pt to monitor for increase abm pain or GI symptoms. - Increase Ozempic 1 to 2 mg weekly - UMicroalb/Cr Ratio today - Ophthalmology referral for diabetic eye exam sent

## 2022-01-15 NOTE — Progress Notes (Signed)
    SUBJECTIVE:   CHIEF COMPLAINT / HPI:   DM is a 56yo F w/ hx of HTN, T2DM, GERD, obesity, umbilical hernia repair that p/f management of chronic abm pain and T2DM.  Chronic Abm Pain Pt has seen surgeon and obtrained CT, which did not show obstruction. She still has the pain. It does not seem to be related to eating or BM's. She is interested in adjusting her Tramadol dosing, but also understands the risks.   Weight/T2DM Pt saw Dr Valentina Lucks for medication management of weight and diabetes. She is up 13lbs from prior visit. She is amendable to increasing Ozempic.  HTN BP is elevated today. She was previously taken off lisinopril due to controlled BP. Did not experience any adverse effects on Lisinopril  PERTINENT  PMH / PSH: as above  OBJECTIVE:   BP (!) 142/90   Pulse 80   Ht 5\' 3"  (1.6 m)   Wt 288 lb 6.4 oz (130.8 kg)   LMP 04/05/2016   SpO2 97%   BMI 51.09 kg/m   Gen: Pleasant woman. NAD. Obese habitus. HEENT: NCAT. MMM. Resp: CTAB. Normal WOB on RA. CV: RRR  ASSESSMENT/PLAN:   Essential hypertension Elevated today with recheck. Was previously on lisinopril and tolerated well.  - Restart Lisinopril 10mg  daily  Type 2 diabetes mellitus with complication (HCC) P5W 6.7, at goal. Pt has 13lb weight gain since last visit. Plan to increase Ozempic for additional weight benefit. Advised pt to monitor for increase abm pain or GI symptoms. - Increase Ozempic 1 to 2 mg weekly - UMicroalb/Cr Ratio today - Ophthalmology referral for diabetic eye exam sent  Generalized abdominal pain Was seen by Gen Surg and went for CT, which showed postsurgical changes but no obstruction. Pt does have a 25mm nonobstructing L renal stone, but low suspicion for this causing her pain given no dysuria and size of stone. Pt does have hx of H pylori that was treated, so we will obtain breath test today in case of re-infection. - Pt has f/u with Gen Surg to further discuss CT results - H pylori breath  test today - Cont gabapentin, tramadol, and duloxetine  Rash Underbreast rash has now improved with topical cortisone cream. This is a recurrent rash that is triggered when pt gets sweaty. Pt says that Nystatin prescription was never received by pharmacy. - Nystatin powder resent to pharmacy   Arlyce Dice, MD [place]

## 2022-01-15 NOTE — Progress Notes (Signed)
    S:     Chief Complaint  Patient presents with   Medication Management    Diabetes follow-up   56 y.o. female who presents for diabetes evaluation, education, and management.  PMH is significant for hypertension and obesity.  Patient was referred and last seen by Primary Care Provider, Dr. Markus Jarvis, on 11/06/21. Patient was last seen at the pharmacy clinic on 11/02/21.   At last visit, Ozempic 1 mg was continued.   Today, patient arrives in  good spirits and presents without any assistance.   Current diabetes medications include: Ozempic (semaglutide) 1 mg  Patient reports adherence to taking all medications as prescribed.   Insurance coverage: [employer]   Reported home fasting blood sugars: 120s   Patient-reported exercise habits: Patient reports she was walking but hurt her knee and hasn't been walking much since.   Patient reports stomach pain, but eats regularly.   O:   Review of Systems  All other systems reviewed and are negative.   Physical Exam Constitutional:      Appearance: Normal appearance.  Pulmonary:     Effort: Pulmonary effort is normal.  Neurological:     Mental Status: She is alert.  Psychiatric:        Mood and Affect: Mood normal.        Behavior: Behavior normal.        Thought Content: Thought content normal.        Judgment: Judgment normal.     Lab Results  Component Value Date   HGBA1C 6.7 01/15/2022   Vitals:   01/15/22 0918  BP: (!) 148/84  Pulse: 82    Lipid Panel     Component Value Date/Time   CHOL 192 06/21/2020 1547   TRIG 131 06/21/2020 1547   HDL 54 06/21/2020 1547   CHOLHDL 3.6 06/21/2020 1547   LDLCALC 115 (H) 06/21/2020 1547   LDLDIRECT 117 (H) 02/28/2021 0917    Clinical Atherosclerotic Cardiovascular Disease (ASCVD): No  The 10-year ASCVD risk score (Arnett DK, et al., 2019) is: 6%   Values used to calculate the score:     Age: 51 years     Sex: Female     Is Non-Hispanic African American: No      Diabetic: Yes     Tobacco smoker: No     Systolic Blood Pressure: 315 mmHg     Is BP treated: Yes     HDL Cholesterol: 54 mg/dL     Total Cholesterol: 192 mg/dL   A/P: Diabetes longstanding, currently moderately controlled. Patient is able to verbalize appropriate hypoglycemia management plan. Medication adherence appears good.  Plan discussed with PCP, Dr. Markus Jarvis.  -Increased dose of Ozempic (semaglutide) from 1 mg weekly to 2 mg weekly.  -Patient educated on purpose, proper use, and potential adverse effects of nausea.  -Extensively discussed pathophysiology of diabetes, recommended lifestyle interventions, dietary effects on blood sugar control.  -Counseled on s/sx of and management of hypoglycemia.   Hypertension currently uncontrolled. Blood pressure goal of <140/90 mmHg. Blood pressure control is suboptimal due to not being on any antihyperrtensive medications. -Restarted lisinopril 10 mg daily  Written patient instructions provided. Patient verbalized understanding of treatment plan.  Total time in face to face counseling 15 minutes.    Follow-up:  Pharmacist on 11/30. PCP clinic visit on 11/30.  Patient seen with Geraldo Docker, PharmD Candidate and Garrel Ridgel PharmD Candidate.

## 2022-01-15 NOTE — Assessment & Plan Note (Signed)
Underbreast rash has now improved with topical cortisone cream. This is a recurrent rash that is triggered when pt gets sweaty. Pt says that Nystatin prescription was never received by pharmacy. - Nystatin powder resent to pharmacy

## 2022-01-15 NOTE — Assessment & Plan Note (Signed)
Diabetes longstanding, currently moderately controlled. Patient is able to verbalize appropriate hypoglycemia management plan. Medication adherence appears good.  Plan discussed with PCP, Dr. Markus Jarvis.  -Increased dose of Ozempic (semaglutide) from 1 mg weekly to 2 mg weekly.  -Patient educated on purpose, proper use, and potential adverse effects of nausea.  -Extensively discussed pathophysiology of diabetes, recommended lifestyle interventions, dietary effects on blood sugar control.  -Counseled on s/sx of and management of hypoglycemia.

## 2022-01-15 NOTE — Assessment & Plan Note (Signed)
Hypertension currently uncontrolled. Blood pressure goal of <140/90 mmHg. Blood pressure control is suboptimal due to not being on any antihyperrtensive medications. -Restarted lisinopril 10 mg daily

## 2022-01-15 NOTE — Assessment & Plan Note (Signed)
Elevated today with recheck. Was previously on lisinopril and tolerated well.  - Restart Lisinopril 10mg  daily

## 2022-01-15 NOTE — Progress Notes (Signed)
Reviewed: I agree with Dr. Koval's documentation and management. 

## 2022-01-15 NOTE — Patient Instructions (Addendum)
Increase Ozempic from 1mg  to 2mg  once weekly.  Restart lisinopril 10mg  daily.

## 2022-01-15 NOTE — Assessment & Plan Note (Addendum)
Was seen by Gen Surg and went for CT, which showed postsurgical changes but no obstruction. Pt does have a 78mm nonobstructing L renal stone, but low suspicion for this causing her pain given no dysuria and size of stone. Pt does have hx of H pylori that was treated, so we will obtain breath test today in case of re-infection. - Pt has f/u with Gen Surg to further discuss CT results - H pylori breath test today - Cont gabapentin, tramadol, and duloxetine

## 2022-01-16 LAB — MICROALBUMIN / CREATININE URINE RATIO
Creatinine, Urine: 59.9 mg/dL
Microalb/Creat Ratio: 22 mg/g creat (ref 0–29)
Microalbumin, Urine: 12.9 ug/mL

## 2022-01-17 LAB — H. PYLORI BREATH TEST: H pylori Breath Test: POSITIVE — AB

## 2022-01-18 ENCOUNTER — Telehealth: Payer: Self-pay | Admitting: Family Medicine

## 2022-01-18 DIAGNOSIS — A048 Other specified bacterial intestinal infections: Secondary | ICD-10-CM

## 2022-01-18 MED ORDER — BISMUTH/METRONIDAZ/TETRACYCLIN 140-125-125 MG PO CAPS: 3.0000 | ORAL_CAPSULE | Freq: Four times a day (QID) | ORAL | 0 refills | Status: DC

## 2022-01-18 MED ORDER — OMEPRAZOLE 20 MG PO CPDR: 20.0000 mg | DELAYED_RELEASE_CAPSULE | Freq: Two times a day (BID) | ORAL | 0 refills | Status: DC

## 2022-01-18 NOTE — Telephone Encounter (Signed)
Attempted to call pt, left VM to call back. Pt is positive for H Pylori infection. I will send in prescription for Pylera and omeprazole to take for 14 days. - Start taking Pylera 3capsules four times a day for 14 days.  - Start taking Omeprazole 1 tablet two times a day for 14 days.

## 2022-01-19 ENCOUNTER — Other Ambulatory Visit (HOSPITAL_COMMUNITY): Payer: Self-pay

## 2022-01-23 NOTE — Progress Notes (Signed)
    SUBJECTIVE:   CHIEF COMPLAINT / HPI:   Rita Bass is a 56 y.o. female who presents to the [facility] clinic today to discuss the following concerns:   Coughing, Wheezing  Has had a non-productive cough x2 weeks without associated fever. She has been taking DayQuil. No sick contacts at home. She was seen at the [facility] and received a COVID vaccine (#5), also received Flu vaccine the same day. She got sick about 3 days after this and is wondering if the vaccines contributed. She has been coughing so much that her chest and ribs are starting to hurt. She does not feel short of breath at rest or on exertion but she coughs more with exertion. She sleeps on 3 pillows at night- this is typical for her. She has not had to increase the amount of pillows. She states that her legs are typically swollen and they have not increased in swelling. No recent travel.   Of note patient is currently on H. Pylori triple therapy treatment as she had a positive breath test on 10/30. She has not yet picked up this medication because she reports she was not told about her results and did not know she had a prescription sent to her pharmacy.   Reviewed CT abdomen pelvis on 10/26 which showed that the visualized lung bases were clear.  She did have a 3 mm nonobstructing left renal inferior pole calculus without hydronephrosis.  No acute intra-abdominal or pelvic pathology seen. Last echo in 09/2017 which showed an EF of 55 to 60%, grade 1 diastolic dysfunction, moderate LVH PFTs from 09/2017 showed FEV1/FVC of 0.88 without post change.   PERTINENT  PMH / PSH: HTN, T2DM, HLD,HFpEF  OBJECTIVE:   BP 138/82   Pulse 98   Wt 290 lb (131.5 kg)   LMP 04/05/2016   SpO2 96%   BMI 51.37 kg/m    General: NAD, pleasant, able to participate in exam Cardiac: RRR, no murmurs. Respiratory: Normal effort, intermittent dry cough throughout exam, rhonchi to lower bases, no wheezing or rales Abdomen: Obese,  non-distended Extremities: no pitting edema  Skin: warm and dry Psych: Normal affect and mood  ASSESSMENT/PLAN:   Viral URI with cough Subacute cough x2 weeks that is non-productive and without associated symptoms of fever or SOB. Lung examination notable for rhonchi to bases. No signs of volume overload. She has normal effort on room air. At this time I feel pneumonia is less likely and will defer CXR. Other considerations such as PE, HFpEF also less likely given history and examination (not tachycardic, normal saturation, no signs of volume overload). She has questionable hx of COPD (upon review of PFT from 2019 appears more restrictive than obstructive) and she is not on medication for this. No wheezing on examination and given non-productive cough feel exacerbation is less likely. -Rx Mucinex, tessalon perles -Can take honey as needed for cough -Return precautions discussed   H. pylori infection We discussed that her recent breath test was positive and that antibiotics were sent to her pharmacy last week. Unfortunately patient reports she was unaware of this previously but she plans to go to the pharmacy to pick up prescription.    Sabino Dick, DO [facility]

## 2022-01-23 NOTE — Patient Instructions (Addendum)
It was wonderful to see you today.  Today we talked about:  -I suspect that your cough is from a viral infection. I am reassured that you are not feeling short of breath or fevers. I don't think that we need to start antibiotics at this time but if your symptoms worsen, you start to have fevers, feel short of breath, or if the cough persists for more than 8 weeks please return.  -I have sent Mucinex and Tessalon perles for you to take for your cough. You can also take a teaspoon of honey alone or mixed with water to help with your cough.   Thank you for coming to your visit as scheduled. We have had a large "no-show" problem lately, and this significantly limits our ability to see and care for patients. As a friendly reminder- if you cannot make your appointment please call to cancel. We do have a no show policy for those who do not cancel within 24 hours. Our policy is that if you miss or fail to cancel an appointment within 24 hours, 3 times in a 1-month period, you may be dismissed from our clinic.   Thank you for choosing [facility].   Please call [phone] with any questions about today's appointment.  Please be sure to schedule follow up at the front  desk before you leave today.   Sabino Dick, DO PGY-3 Family Medicine

## 2022-01-24 ENCOUNTER — Ambulatory Visit (INDEPENDENT_AMBULATORY_CARE_PROVIDER_SITE_OTHER): Payer: Medicaid Other | Admitting: Family Medicine

## 2022-01-24 VITALS — BP 138/82 | HR 98 | Wt 290.0 lb

## 2022-01-24 DIAGNOSIS — J069 Acute upper respiratory infection, unspecified: Secondary | ICD-10-CM | POA: Diagnosis present

## 2022-01-24 DIAGNOSIS — A048 Other specified bacterial intestinal infections: Secondary | ICD-10-CM

## 2022-01-24 MED ORDER — GUAIFENESIN ER 600 MG PO TB12: 600.0000 mg | ORAL_TABLET | Freq: Two times a day (BID) | ORAL | 0 refills | Status: DC | PRN

## 2022-01-24 MED ORDER — BENZONATATE 100 MG PO CAPS: 200.0000 mg | ORAL_CAPSULE | Freq: Three times a day (TID) | ORAL | 0 refills | Status: DC | PRN

## 2022-01-24 NOTE — Assessment & Plan Note (Signed)
Subacute cough x2 weeks that is non-productive and without associated symptoms of fever or SOB. Lung examination notable for rhonchi to bases. No signs of volume overload. She has normal effort on room air. At this time I feel pneumonia is less likely and will defer CXR. Other considerations such as PE, HFpEF also less likely given history and examination (not tachycardic, normal saturation, no signs of volume overload). She has questionable hx of COPD (upon review of PFT from 2019 appears more restrictive than obstructive) and she is not on medication for this. No wheezing on examination and given non-productive cough feel exacerbation is less likely. -Rx Mucinex, tessalon perles -Can take honey as needed for cough -Return precautions discussed

## 2022-01-24 NOTE — Assessment & Plan Note (Signed)
We discussed that her recent breath test was positive and that antibiotics were sent to her pharmacy last week. Unfortunately patient reports she was unaware of this previously but she plans to go to the pharmacy to pick up prescription.

## 2022-01-26 ENCOUNTER — Ambulatory Visit (HOSPITAL_COMMUNITY)
Admission: EM | Admit: 2022-01-26 | Discharge: 2022-01-26 | Disposition: A | Discharge status: Home / Self Care | Payer: Medicaid Other | Attending: Family Medicine | Admitting: Family Medicine

## 2022-01-26 ENCOUNTER — Ambulatory Visit (INDEPENDENT_AMBULATORY_CARE_PROVIDER_SITE_OTHER): Payer: Medicaid Other

## 2022-01-26 DIAGNOSIS — R062 Wheezing: Secondary | ICD-10-CM | POA: Diagnosis not present

## 2022-01-26 DIAGNOSIS — R059 Cough, unspecified: Secondary | ICD-10-CM

## 2022-01-26 DIAGNOSIS — R9389 Abnormal findings on diagnostic imaging of other specified body structures: Secondary | ICD-10-CM

## 2022-01-26 DIAGNOSIS — J069 Acute upper respiratory infection, unspecified: Secondary | ICD-10-CM

## 2022-01-26 MED ORDER — AZITHROMYCIN 250 MG PO TABS: 250.0000 mg | ORAL_TABLET | Freq: Every day | ORAL | 0 refills | Status: DC

## 2022-01-26 MED ORDER — ALBUTEROL SULFATE (5 MG/ML) 0.5% IN NEBU: 2.5000 mg | INHALATION_SOLUTION | Freq: Four times a day (QID) | RESPIRATORY_TRACT | 0 refills | Status: DC | PRN

## 2022-01-26 MED ORDER — PREDNISONE 50 MG PO TABS: 50.0000 mg | ORAL_TABLET | Freq: Every day | ORAL | 0 refills | Status: AC

## 2022-01-26 NOTE — ED Triage Notes (Signed)
Pt presents to office for cough and congestion x 2 weeks. Pt was seen by her PCP yesterday and prescribed mucinex and tessalon pearls.  Pt reports using her inhaler last night.

## 2022-01-26 NOTE — ED Notes (Signed)
Discharged by Brittany, RN

## 2022-01-26 NOTE — Discharge Instructions (Signed)
You were seen today for upper respiratory symptoms.  Your chest xray did show abnormal markings, concerning for pneumonia.  I have sent out an antibiotic to treat this, as well as a steroid x 5 days.  You may continue your current medications as well to help.  I have refilled your albuterol nebulizer as well.  Please follow up with your primary care provider if not improving.

## 2022-01-26 NOTE — ED Provider Notes (Signed)
[facility]    CSN: [id number] Arrival date & time: 01/26/22  0809      History   Chief Complaint Chief Complaint  Patient presents with   Cough    HPI Rita Bass is a 56 y.o. female.   Patient is here for uri symptoms.  She has has had a cough x 2 weeks.  No fever, chills.  Also with wheezing, uncontrollable cough.  She has pain in her chest/ribs due to all the coughing.  She did go to her PCP 3 days ago, given tessalon and mucinex, but not helping.  She does have a nebulizer and inhaler at home (asthma).  That has not really been helping much.  No h/o smoking.  No diarrhea or vomiting.  No sinus congestion or drainage.       Past Medical History:  Diagnosis Date   Arthritis    Asthma    Chronic pain of left knee    Complication of anesthesia    PONV   Diabetes (HCC)    GERD (gastroesophageal reflux disease)    H/O bronchitis    Hepatic steatosis    Internal hemorrhoids    Kidney stones    Obesity    Renal disorder    Shortness of breath dyspnea    Sleep apnea    past issues - at weight over 400lbs   Sleep apnea in adult    Polysomnogram pending.  Followed by Dr. Craige Cotta    Patient Active Problem List   Diagnosis Date Noted   Viral URI with cough 01/24/2022   Rash 01/15/2022   External hemorrhoid 09/15/2021   Generalized abdominal pain 08/30/2021   Ventral hernia 08/16/2021   Allergic conjunctivitis 07/28/2021   Grade II internal hemorrhoids    H. 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   Diabetic nephropathy associated with type 2 diabetes mellitus (HCC) 10/25/2017   Type 2 diabetes mellitus with complication (HCC) 09/13/2017   Sleep apnea 08/16/2017   Chronic pain of right knee 07/13/2016   Morbid obesity (HCC) 02/04/2015   Menorrhagia with regular cycle 11/18/2014    Past Surgical History:  Procedure Laterality Date   BIOPSY  11/01/2020   Procedure: BIOPSY;  Surgeon: Shellia Cleverly, DO;  Location: [facility];  Service: Gastroenterology;;   BIOPSY  05/11/2021   Procedure: BIOPSY;  Surgeon: Shellia Cleverly, DO;  Location: [facility];  Service: Gastroenterology;;   CESAREAN SECTION     6 c-sections   CHOLECYSTECTOMY     COLONOSCOPY WITH PROPOFOL N/A 05/11/2021   Procedure: COLONOSCOPY WITH PROPOFOL;  Surgeon: Shellia Cleverly, DO;  Location: [facility];  Service: Gastroenterology;  Laterality: N/A;   ESOPHAGOGASTRODUODENOSCOPY (EGD) WITH PROPOFOL N/A 11/01/2020   Procedure: ESOPHAGOGASTRODUODENOSCOPY (EGD) WITH PROPOFOL;  Surgeon: Shellia Cleverly, DO;  Location: [facility];  Service: Gastroenterology;  Laterality: N/A;   HERNIA REPAIR     KIDNEY STONE SURGERY  OOPHORECTOMY     TRANSTHORACIC ECHOCARDIOGRAM  09/2017   Technically difficult study.  Did not use Definity contrast.  EF was 55 to 60% with moderate LVH and grade 1 diastolic dysfunction.  No significant valvular lesions noted.  No regional wall motion normality but difficult to assess due to poor imaging.    TUBAL LIGATION     UPPER GASTROINTESTINAL ENDOSCOPY      OB History     Gravida  6   Para  6   Term  5   Preterm  1   AB  0   Living  7      SAB  0   IAB  0   Ectopic  0   Multiple  2   Live Births               Home Medications    Prior to Admission medications   Medication Sig Start Date End Date Taking?  Authorizing Provider  ACCU-CHEK GUIDE test strip TEST ONCE DAILY 04/10/21   Jackelyn Poling, DO  Accu-Chek Softclix Lancets lancets Use as instructed 11/25/20   Moses Manners, MD  acetaminophen (TYLENOL) 650 MG CR tablet Take 650-1,300 mg by mouth every 8 (eight) hours as needed for pain. Patient not taking: Reported on 01/24/2022    [provider]  AMBULATORY NON FORMULARY MEDICATION Medication Name: Diltiazem 2%/Lidocaine 5%- Using your index finger, apply a small amount of medication inside the rectum up to your first knuckle/joint three times daily x 8 weeks. Patient not taking: Reported on 01/15/2022 09/22/21   Doree Albee, PA-C  benzonatate (TESSALON PERLES) 100 MG capsule Take 2 capsules (200 mg total) by mouth 3 (three) times daily as needed for cough. 01/24/22   Sabino Dick, DO  Bismuth/Metronidaz/Tetracyclin (PYLERA) [phone] MG CAPS Take 3 capsules by mouth in the morning, at noon, in the evening, and at bedtime for 14 days. Patient not taking: Reported on 01/24/2022 01/18/22 02/01/22  Lincoln Brigham, MD  cetirizine (ZYRTEC) 10 MG tablet Take 1 tablet (10 mg total) by mouth daily. Patient not taking: Reported on 10/05/2021 07/28/21   Sabino Dick, DO  DULoxetine (CYMBALTA) 20 MG capsule Take 1 capsule (20 mg total) by mouth daily. Patient not taking: Reported on 01/15/2022 11/11/21   Lincoln Brigham, MD  fluticasone [facility]) 50 MCG/ACT nasal spray Place 2 sprays into both nostrils daily. Patient not taking: Reported on 01/15/2022 07/25/21   Jackelyn Poling, DO  gabapentin (NEURONTIN) 100 MG capsule Take 4 capsules (400 mg total) by mouth 3 (three) times daily. Patient not taking: Reported on 01/24/2022 12/28/21 03/28/22  Lincoln Brigham, MD  guaiFENesin (MUCINEX) 600 MG 12 hr tablet Take 1 tablet (600 mg total) by mouth 2 (two) times daily as needed for cough or to loosen phlegm. 01/24/22   Sabino Dick, DO  hydrocortisone (ANUSOL-HC) 2.5 % rectal cream Place 1  Application rectally 2 (two) times daily. Patient not taking: Reported on 01/15/2022 10/30/21   Lincoln Brigham, MD  Insulin Pen Needle (PEN NEEDLES) 32G X 5 MM MISC Use as directed with victoza 11/25/20   Hensel, Santiago Bumpers, MD  lisinopril (ZESTRIL) 10 MG tablet Take 1 tablet (10 mg total) by mouth daily. 01/15/22   Lincoln Brigham, MD  NON FORMULARY Diltiazem 2%/Lidocaine5% compound Use 3 x rectally daily for 2 months to heal anal fissure Patient not taking: Reported on 01/15/2022 09/22/21   Doree Albee, PA-C  nystatin (MYCOSTATIN/NYSTOP) powder Apply 1 Application topically 2 (two) times daily.  01/15/22   Lincoln Brigham, MD  olopatadine (PATANOL) 0.1 % ophthalmic solution Place 1 drop into both eyes 2 (two) times daily. Patient not taking: Reported on 09/22/2021 07/28/21   Sabino Dick, DO  omeprazole (PRILOSEC) 20 MG capsule Take 1 capsule (20 mg total) by mouth 2 (two) times daily. Take for 14 days. Patient not taking: Reported on 01/24/2022 01/18/22   Lincoln Brigham, MD  ondansetron (ZOFRAN) 4 MG tablet Take 1 tablet (4 mg total) by mouth every 8 (eight) hours as needed for nausea or vomiting. Patient not taking: Reported on 01/15/2022 06/16/21   Jackelyn Poling, DO  Semaglutide, 2 MG/DOSE, (OZEMPIC, 2 MG/DOSE,) 8 MG/3ML SOPN Inject 2 mg into the skin once a week. This replaces 1 mg pen. 01/15/22   Moses Manners, MD  traMADol (ULTRAM) 50 MG tablet Take 1 tablet (50 mg total) by mouth every 12 (twelve) hours as needed. Not to be refilled before 30 days Patient not taking: Reported on 01/24/2022 12/28/21   Lincoln Brigham, MD    Family History Family History  Problem Relation Age of Onset   Breast cancer Mother    Other Father        committed suicide   Diabetes Father    Heart attack Maternal Aunt    Colon cancer Neg Hx    Esophageal cancer Neg Hx    Pancreatic cancer Neg Hx    Stomach cancer Neg Hx    Liver disease Neg Hx    Colon polyps Neg Hx    Rectal cancer Neg Hx     Social  History Social History   Tobacco Use   Smoking status: Never   Smokeless tobacco: Never  Vaping Use   Vaping Use: Never used  Substance Use Topics   Alcohol use: No   Drug use: No     Allergies   Patient has no known allergies.   Review of Systems Review of Systems  Constitutional:  Negative for chills and fever.  HENT:  Negative for congestion and rhinorrhea.   Respiratory:  Positive for cough and wheezing.   Cardiovascular: Negative.   Gastrointestinal: Negative.   Musculoskeletal: Negative.   Psychiatric/Behavioral: Negative.       Physical Exam Triage Vital Signs ED Triage Vitals [01/26/22 0900]  Enc Vitals Group     BP (!) 148/128     Pulse Rate 93     Resp 18     Temp 98.6 F (37 C)     Temp src      SpO2      Weight      Height      Head Circumference      Peak Flow      Pain Score      Pain Loc      Pain Edu?      Excl. in GC?    No data found.  Updated Vital Signs BP (!) 148/128   Pulse 93   Temp 98.6 F (37 C)   Resp (!) 100   LMP 04/05/2016   Visual Acuity Right Eye Distance:   Left Eye Distance:   Bilateral Distance:    Right Eye Near:   Left Eye Near:    Bilateral Near:     Physical Exam Constitutional:      Appearance: Normal appearance.  HENT:     Head: Normocephalic.     Nose: Nose normal.  Cardiovascular:     Rate and Rhythm: Normal rate and regular rhythm.  Pulmonary:     Effort: Pulmonary effort is normal. No respiratory distress.     Breath sounds: Wheezing present.  Musculoskeletal:     Cervical back: Normal range of motion.  Skin:    General: Skin is warm.  Neurological:     General: No focal deficit present.     Mental Status: She is alert.  Psychiatric:        Mood and Affect: Mood normal.      UC Treatments / Results  Labs (all labs ordered are listed, but only abnormal results are displayed) Labs Reviewed - No data to display  EKG   Radiology DG Chest 2 View  Result Date:  01/26/2022 CLINICAL DATA:  56 year old female with cough wheezing and congestion for 2 weeks. EXAM: CHEST - 2 VIEW COMPARISON:  Chest radiographs 12/18/2019 and earlier. FINDINGS: Stable low normal lung volumes. Mediastinal contours are stable and within normal limits. Visualized tracheal air column is within normal limits. Mildly increased interstitial lung markings bilaterally. But no pneumothorax, pulmonary edema, pleural effusion, or confluent pulmonary opacity. Negative visible bowel gas. No acute osseous abnormality identified. IMPRESSION: Mild diffusely increased interstitial lung markings since 2021, consider acute viral/atypical respiratory infection. No pleural effusion. Electronically Signed   By: Odessa Fleming M.D.   On: 01/26/2022 09:40    Procedures Procedures (including critical care time)  Medications Ordered in UC Medications - No data to display  Initial Impression / Assessment and Plan / UC Course  I have reviewed the triage vital signs and the nursing notes.  Pertinent labs & imaging results that were available during my care of the patient were reviewed by me and considered in my medical decision making (see chart for details).    Final Clinical Impressions(s) / UC Diagnoses   Final diagnoses:  Upper respiratory tract infection, unspecified type  Abnormal chest x-ray     Discharge Instructions      You were seen today for upper respiratory symptoms.  Your chest xray did show abnormal markings, concerning for pneumonia.  I have sent out an antibiotic to treat this, as well as a steroid x 5 days.  You may continue your current medications as well to help.  I have refilled your albuterol nebulizer as well.  Please follow up with your primary care provider if not improving.     ED Prescriptions     Medication Sig Dispense Auth. Provider   albuterol (PROVENTIL) (5 MG/ML) 0.5% nebulizer solution Take 0.5 mLs (2.5 mg total) by nebulization every 6 (six) hours as needed for  wheezing or shortness of breath. 20 mL Pearlie Nies, MD   azithromycin (ZITHROMAX) 250 MG tablet Take 1 tablet (250 mg total) by mouth daily. Take first 2 tablets together, then 1 every day until finished. 6 tablet Aniken Monestime, MD   predniSONE (DELTASONE) 50 MG tablet Take 1 tablet (50 mg total) by mouth daily for 5 days. 5 tablet Jannifer Franklin, MD      PDMP not reviewed this encounter.   Jannifer Franklin, MD 01/26/22 [phone]

## 2022-01-29 ENCOUNTER — Telehealth: Payer: Self-pay

## 2022-01-29 DIAGNOSIS — R1033 Periumbilical pain: Secondary | ICD-10-CM

## 2022-01-29 DIAGNOSIS — J069 Acute upper respiratory infection, unspecified: Secondary | ICD-10-CM

## 2022-01-29 DIAGNOSIS — R051 Acute cough: Secondary | ICD-10-CM

## 2022-01-29 NOTE — Telephone Encounter (Signed)
Patient calls nurse line regarding cough. She was seen in urgent care on 01/26/22 and was prescribed azithromycin and prednisone. She states that tessalon perles are not helping with cough.   She is requesting prescription for cough syrup. Patient is asking that request be sent to PCP, as she is locked in with provider through [facility] for prescriptions.   Forwarding to PCP.   Veronda Prude, RN

## 2022-02-01 DIAGNOSIS — Z9889 Other specified postprocedural states: Secondary | ICD-10-CM | POA: Diagnosis not present

## 2022-02-01 MED ORDER — GUAIFENESIN ER 600 MG PO TB12: 600.0000 mg | ORAL_TABLET | Freq: Two times a day (BID) | ORAL | 0 refills | Status: DC | PRN

## 2022-02-01 MED ORDER — TRAMADOL HCL 50 MG PO TABS: 50.0000 mg | ORAL_TABLET | Freq: Two times a day (BID) | ORAL | 0 refills | Status: DC | PRN

## 2022-02-01 MED ORDER — BENZONATATE 100 MG PO CAPS: 200.0000 mg | ORAL_CAPSULE | Freq: Three times a day (TID) | ORAL | 0 refills | Status: DC | PRN

## 2022-02-01 NOTE — Telephone Encounter (Signed)
Patient returns call to nurse line regarding this concern.   Please advise.   Kingslee Mairena C Ree Alcalde, RN  

## 2022-02-01 NOTE — Telephone Encounter (Signed)
Called pt back regarding cough. She has been tx for PNA at [facility] but reports that a dry cough is persisting. She got tessalon pearls and mucinex, but reports minimal benefit. She needs a refill of both. - Refill of Tessalon Perls and mucinex sent - Discussed that cough can linger even after treatment of PNA - Has f/u with me (PCP) 11/30  Consider ordering repeat chest CT to f/u lung nodule - Refill sent for Tramadol. PDMP reviewed by me. - Pt reports that she has started the treatment for H pylori

## 2022-02-05 ENCOUNTER — Other Ambulatory Visit: Payer: Self-pay | Admitting: Family Medicine

## 2022-02-05 DIAGNOSIS — A048 Other specified bacterial intestinal infections: Secondary | ICD-10-CM

## 2022-02-15 ENCOUNTER — Ambulatory Visit: Payer: Medicaid Other | Admitting: Pharmacist

## 2022-02-15 ENCOUNTER — Ambulatory Visit: Payer: Medicaid Other | Admitting: Family Medicine

## 2022-02-21 ENCOUNTER — Ambulatory Visit: Payer: Medicaid Other | Admitting: Family Medicine

## 2022-02-21 ENCOUNTER — Ambulatory Visit: Payer: Medicaid Other | Admitting: Pharmacist

## 2022-02-21 ENCOUNTER — Ambulatory Visit: Payer: Medicaid Other

## 2022-02-26 ENCOUNTER — Ambulatory Visit: Payer: Medicaid Other | Admitting: Pharmacist

## 2022-02-26 ENCOUNTER — Ambulatory Visit: Payer: Medicaid Other | Admitting: Family Medicine

## 2022-03-01 ENCOUNTER — Other Ambulatory Visit: Payer: Self-pay

## 2022-03-01 DIAGNOSIS — R1084 Generalized abdominal pain: Secondary | ICD-10-CM

## 2022-03-01 DIAGNOSIS — R1033 Periumbilical pain: Secondary | ICD-10-CM

## 2022-03-02 MED ORDER — GABAPENTIN 100 MG PO CAPS: 400.0000 mg | ORAL_CAPSULE | Freq: Three times a day (TID) | ORAL | 2 refills | Status: DC

## 2022-03-02 MED ORDER — TRAMADOL HCL 50 MG PO TABS: 50.0000 mg | ORAL_TABLET | Freq: Two times a day (BID) | ORAL | 0 refills | Status: DC | PRN

## 2022-03-02 NOTE — Telephone Encounter (Signed)
Patient returns call to nurse line to check status of rx refill.   Melea Prezioso C Adalee Kathan, RN  

## 2022-03-05 ENCOUNTER — Ambulatory Visit: Payer: Medicaid Other | Admitting: Pharmacist

## 2022-03-23 ENCOUNTER — Ambulatory Visit: Payer: Medicaid Other | Admitting: Family Medicine

## 2022-03-29 ENCOUNTER — Ambulatory Visit: Payer: Medicaid Other | Admitting: Pharmacist

## 2022-03-30 ENCOUNTER — Other Ambulatory Visit: Payer: Self-pay

## 2022-03-30 DIAGNOSIS — R1033 Periumbilical pain: Secondary | ICD-10-CM

## 2022-04-02 MED ORDER — TRAMADOL HCL 50 MG PO TABS: 50.0000 mg | ORAL_TABLET | Freq: Two times a day (BID) | ORAL | 0 refills | Status: DC | PRN

## 2022-04-02 NOTE — Telephone Encounter (Signed)
Patient returns call to nurse line regarding rx refill request.  ° °Rita Bass C Karolee Meloni, RN ° °

## 2022-04-27 ENCOUNTER — Encounter: Payer: Self-pay | Admitting: Family Medicine

## 2022-04-27 ENCOUNTER — Other Ambulatory Visit: Payer: Self-pay

## 2022-04-27 ENCOUNTER — Ambulatory Visit (INDEPENDENT_AMBULATORY_CARE_PROVIDER_SITE_OTHER): Payer: Medicaid Other | Admitting: Family Medicine

## 2022-04-27 VITALS — BP 148/87 | HR 81 | Wt 308.6 lb

## 2022-04-27 DIAGNOSIS — R911 Solitary pulmonary nodule: Secondary | ICD-10-CM | POA: Diagnosis not present

## 2022-04-27 DIAGNOSIS — I10 Essential (primary) hypertension: Secondary | ICD-10-CM

## 2022-04-27 DIAGNOSIS — M170 Bilateral primary osteoarthritis of knee: Secondary | ICD-10-CM | POA: Diagnosis not present

## 2022-04-27 DIAGNOSIS — E118 Type 2 diabetes mellitus with unspecified complications: Secondary | ICD-10-CM

## 2022-04-27 DIAGNOSIS — E1140 Type 2 diabetes mellitus with diabetic neuropathy, unspecified: Secondary | ICD-10-CM

## 2022-04-27 DIAGNOSIS — E1121 Type 2 diabetes mellitus with diabetic nephropathy: Secondary | ICD-10-CM

## 2022-04-27 DIAGNOSIS — A048 Other specified bacterial intestinal infections: Secondary | ICD-10-CM

## 2022-04-27 DIAGNOSIS — G8929 Other chronic pain: Secondary | ICD-10-CM

## 2022-04-27 MED ORDER — METFORMIN HCL ER 500 MG PO TB24: 500.0000 mg | ORAL_TABLET | Freq: Every day | ORAL | 3 refills | Status: DC

## 2022-04-27 MED ORDER — LISINOPRIL 10 MG PO TABS: 10.0000 mg | ORAL_TABLET | Freq: Every day | ORAL | 3 refills | Status: DC

## 2022-04-27 MED ORDER — DULOXETINE HCL 20 MG PO CPEP: 20.0000 mg | ORAL_CAPSULE | Freq: Every day | ORAL | 3 refills | Status: DC

## 2022-04-27 MED ORDER — EMPAGLIFLOZIN 10 MG PO TABS: 10.0000 mg | ORAL_TABLET | Freq: Every day | ORAL | 3 refills | Status: DC

## 2022-04-27 NOTE — Patient Instructions (Signed)
Good to see you today - Thank you for coming in  Things we discussed today:  1) For your diabetes, let's restart Metformin and Jardiance - Metformin 520m once a day - Jardiance  2) For you knee pain, it is most likely osteoarthritis - I sent a referral for you to get PT - PT should also evaluate you to see if a walking assistance device would be appropriate - I refilled Cymbalta for you to continue taking  3) For your lung nodule, let uKorearepeat a CT of your chest.   4) For your blood pressure, continue taking Losartan   Please always bring your medication bottles  Come back to see me in 1-2 months to check up

## 2022-04-27 NOTE — Progress Notes (Signed)
    SUBJECTIVE:   CHIEF COMPLAINT / HPI:   DM is a 57yo F h/f f/u.  Osteoarthritis Has hx of osteoarthritis of knees. Concern hat pt is starting to fall and lose balance, her knees "give out". She feels pops in her knee when she stands. She feels weak in the knees when she stands up.  Diabetic Neuropathy Reports neuropathic pain in her toes and feet. Asking if gabapentin can be increased.  T2DM Reports that her insurance is no longer covering any injectable medications and stopped covering her ozempic. She is frustrated because she was doing well ont he ozempic and was losing weight as well. She is interested in other management options now.   Lung nodule She has a previous lung nodule noted on imaging and wanted to see if she could get follow-up for it. Says her previous PCP had recommended repeat imaging.   H Pylori Still having abm pain. Finished the entire H pylori course. Unsure when she finished.  PERTINENT  PMH / PSH: HTN, T2DM, GERD, obesity, umbilical hernia repair   OBJECTIVE:   BP (!) 148/87   Pulse 81   Wt (!) 308 lb 9.6 oz (140 kg)   LMP 04/05/2016   SpO2 99%   BMI 54.67 kg/m   Gen: Alert, obese habitus. Pleasant woman, sitiing in chair. NAD. HEENT: NCAT. MMM. Resp: CTAB. Normal WOB on RA. CV: RRR  ASSESSMENT/PLAN:   Lung nodule 44mm LLL lung nodule, benign appearing in 2022.  - Repeat CT ordered for monitoring  Type 2 diabetes mellitus with complication ([place]) Was previously doing well on Ozempic, but insurance not covering any injectable meds anymore. This is unfortunate as this was greatly benefiting her weight loss as well. Will restart metformin and jardiance for now. - Metformin 500 daily, uptitrate as tolerated (previously on 1000 BID) - Jardiance 10mg  daily, (uptitrate as tolerated). - Repeat A1c in 3 months  H. pylori infection Complete H pylori treatment and has been off PPI for >2 weeks. Ordered H pylori breath test, pt plans to return to  clinic and complete this next Friday. - f/u H pylori breath test (test of cure)  Essential hypertension BP elevated, has not been taking Losartan because she was out. - Refilled Losartan - BMP in 1 week  Type 2 diabetes mellitus with diabetic neuropathy, unspecified (HCC) Reports worsening burning pain in toes/feet. Already maxed on gabapentin. Previously on duloxetine, but ran out. Reports that she liked it. - Restart Duloxetine  - Cont gabapening 400 TID - Needs foot exam at follow-up  Osteoarthritis Hx of OA in BL knees. Reports that she has recently had her knees give out, causing falls. Interested in PT and walking assistance device. - Referral to PT for evaluation for potential walking assistance device. Also interested in continuing physical therapy in general.    Arlyce Dice, MD [place]

## 2022-04-30 DIAGNOSIS — E1140 Type 2 diabetes mellitus with diabetic neuropathy, unspecified: Secondary | ICD-10-CM | POA: Insufficient documentation

## 2022-04-30 DIAGNOSIS — M1990 Unspecified osteoarthritis, unspecified site: Secondary | ICD-10-CM | POA: Insufficient documentation

## 2022-04-30 NOTE — Assessment & Plan Note (Signed)
BP elevated, has not been taking Losartan because she was out. - Refilled Losartan - BMP in 1 week

## 2022-04-30 NOTE — Assessment & Plan Note (Signed)
Was previously doing well on Ozempic, but insurance not covering any injectable meds anymore. This is unfortunate as this was greatly benefiting her weight loss as well. Will restart metformin and jardiance for now. - Metformin 500 daily, uptitrate as tolerated (previously on 1000 BID) - Jardiance 81m daily, (uptitrate as tolerated). - Repeat A1c in 3 months

## 2022-04-30 NOTE — Assessment & Plan Note (Signed)
57m LLL lung nodule, benign appearing in 2022.  - Repeat CT ordered for monitoring

## 2022-04-30 NOTE — Assessment & Plan Note (Signed)
Reports worsening burning pain in toes/feet. Already maxed on gabapentin. Previously on duloxetine, but ran out. Reports that she liked it. - Restart Duloxetine  - Cont gabapening 400 TID - Needs foot exam at follow-up

## 2022-04-30 NOTE — Assessment & Plan Note (Signed)
Complete H pylori treatment and has been off PPI for >2 weeks. Ordered H pylori breath test, pt plans to return to clinic and complete this next Friday. - f/u H pylori breath test (test of cure)

## 2022-04-30 NOTE — Assessment & Plan Note (Signed)
Hx of OA in BL knees. Reports that she has recently had her knees give out, causing falls. Interested in PT and walking assistance device. - Referral to PT for evaluation for potential walking assistance device. Also interested in continuing physical therapy in general.

## 2022-05-03 ENCOUNTER — Other Ambulatory Visit: Payer: Self-pay

## 2022-05-03 ENCOUNTER — Telehealth: Payer: Self-pay

## 2022-05-03 DIAGNOSIS — E118 Type 2 diabetes mellitus with unspecified complications: Secondary | ICD-10-CM

## 2022-05-03 MED ORDER — METFORMIN HCL ER 500 MG PO TB24: 500.0000 mg | ORAL_TABLET | Freq: Every day | ORAL | 1 refills | Status: DC

## 2022-05-03 NOTE — Telephone Encounter (Signed)
Patient calls nurse line reporting a PA needed on Jardiance.   Will forward to [place].

## 2022-05-04 ENCOUNTER — Other Ambulatory Visit: Payer: Self-pay | Admitting: Family Medicine

## 2022-05-04 ENCOUNTER — Other Ambulatory Visit: Payer: Medicaid Other

## 2022-05-04 DIAGNOSIS — A048 Other specified bacterial intestinal infections: Secondary | ICD-10-CM | POA: Diagnosis not present

## 2022-05-04 DIAGNOSIS — I10 Essential (primary) hypertension: Secondary | ICD-10-CM | POA: Diagnosis not present

## 2022-05-04 DIAGNOSIS — R1033 Periumbilical pain: Secondary | ICD-10-CM

## 2022-05-04 MED ORDER — TRAMADOL HCL 50 MG PO TABS: 50.0000 mg | ORAL_TABLET | Freq: Two times a day (BID) | ORAL | 0 refills | Status: DC | PRN

## 2022-05-04 NOTE — Telephone Encounter (Signed)
Medication approved through 05/04/2023.  Pharmacy has been updated.

## 2022-05-04 NOTE — Telephone Encounter (Signed)
Patient came in stating that she needs to get a refill on her tramadol, she is completely out.

## 2022-05-04 NOTE — Telephone Encounter (Signed)
Pior Auth submitted for Jardiance through covermymeds.   Key: [id number]

## 2022-05-05 LAB — BASIC METABOLIC PANEL
BUN/Creatinine Ratio: 25 — ABNORMAL HIGH (ref 9–23)
BUN: 13 mg/dL (ref 6–24)
CO2: 24 mmol/L (ref 20–29)
Calcium: 9.8 mg/dL (ref 8.7–10.2)
Chloride: 96 mmol/L (ref 96–106)
Creatinine, Ser: 0.53 mg/dL — ABNORMAL LOW (ref 0.57–1.00)
Glucose: 173 mg/dL — ABNORMAL HIGH (ref 70–99)
Potassium: 4.7 mmol/L (ref 3.5–5.2)
Sodium: 138 mmol/L (ref 134–144)
eGFR: 110 mL/min/{1.73_m2} (ref >59)

## 2022-05-06 LAB — H. PYLORI BREATH TEST: H pylori Breath Test: POSITIVE — AB

## 2022-05-07 ENCOUNTER — Other Ambulatory Visit (HOSPITAL_COMMUNITY): Payer: Self-pay

## 2022-05-09 ENCOUNTER — Other Ambulatory Visit (HOSPITAL_COMMUNITY): Payer: Self-pay

## 2022-05-09 ENCOUNTER — Telehealth: Payer: Self-pay

## 2022-05-09 NOTE — Telephone Encounter (Signed)
Patient calls nurse line stating that tramadol is requiring PA.   Rosendo Gros- have you received a fax request from pharmacy?   Talbot Grumbling, RN

## 2022-05-10 ENCOUNTER — Telehealth: Payer: Self-pay

## 2022-05-10 ENCOUNTER — Telehealth: Payer: Self-pay | Admitting: Family Medicine

## 2022-05-10 DIAGNOSIS — R911 Solitary pulmonary nodule: Secondary | ICD-10-CM

## 2022-05-10 DIAGNOSIS — A048 Other specified bacterial intestinal infections: Secondary | ICD-10-CM

## 2022-05-10 NOTE — Telephone Encounter (Signed)
Patients insurance calls nurse line to initiate PA for Tramadol.    Clinical questions answered via phone.    Clinical notes faxed to insurance.

## 2022-05-10 NOTE — Telephone Encounter (Signed)
Patient is calling and would like for someone to call her with results from her last appointment.   The best call back is [phone]

## 2022-05-10 NOTE — Telephone Encounter (Deleted)
Patients insurance calls nurse line to initiate PA for Tramadol.   Clinical questions answered via phone.   Clinical notes faxed to insurance.

## 2022-05-10 NOTE — Telephone Encounter (Signed)
Radiology calls nurse line in regards to recent CT order.   Radiology reports the patient does not have a documented smoking history, therefore the order will need to be changed.   She reports CT Chest "with" or "without" contrast would be appropriate.   Will forward to PCP.

## 2022-05-14 ENCOUNTER — Ambulatory Visit: Payer: Medicaid Other | Admitting: Physical Therapy

## 2022-05-14 MED ORDER — BISMUTH/METRONIDAZ/TETRACYCLIN 140-125-125 MG PO CAPS: 3.0000 | ORAL_CAPSULE | Freq: Four times a day (QID) | ORAL | 0 refills | Status: DC

## 2022-05-14 MED ORDER — OMEPRAZOLE 20 MG PO CPDR: 20.0000 mg | DELAYED_RELEASE_CAPSULE | Freq: Two times a day (BID) | ORAL | 0 refills | Status: DC

## 2022-05-14 NOTE — Telephone Encounter (Signed)
Attempted to call but was sent to VM, left a VM. Pt H pylori test resulted positive, plan to repeat Quad therapy. - Order sent for 14 day course of    If pt calls back, please let them know:  1) I ordered the wrong CT last time. I have now changed the order for the correct CT scan of her chest to look at her lung nodule. This may take a few days to get scheduled.   2) Your H Pylori test came back positive. This means that you still have the H pylori infection. This is not uncommon to need to repeat treatment for H Pylori. I have sent in a prescription to your pharmacy to redo the 14 day course of medications to kill the bacteria. Please schedule a follow-up appointment with me in 5 weeks to check if the infection is still there.

## 2022-05-14 NOTE — Telephone Encounter (Signed)
Order sent for 14 day course of Pylera and omeprazole. Plan to do test of cure 2-3 wks after finishing course.

## 2022-05-25 ENCOUNTER — Other Ambulatory Visit: Payer: Self-pay

## 2022-05-25 ENCOUNTER — Telehealth: Payer: Self-pay

## 2022-05-25 ENCOUNTER — Encounter: Payer: Self-pay | Admitting: Physical Therapy

## 2022-05-25 ENCOUNTER — Ambulatory Visit: Payer: Medicaid Other | Attending: Family Medicine | Admitting: Physical Therapy

## 2022-05-25 DIAGNOSIS — R2689 Other abnormalities of gait and mobility: Secondary | ICD-10-CM | POA: Insufficient documentation

## 2022-05-25 DIAGNOSIS — M25562 Pain in left knee: Secondary | ICD-10-CM | POA: Insufficient documentation

## 2022-05-25 DIAGNOSIS — M25551 Pain in right hip: Secondary | ICD-10-CM | POA: Insufficient documentation

## 2022-05-25 DIAGNOSIS — M179 Osteoarthritis of knee, unspecified: Secondary | ICD-10-CM

## 2022-05-25 DIAGNOSIS — M25561 Pain in right knee: Secondary | ICD-10-CM | POA: Diagnosis not present

## 2022-05-25 DIAGNOSIS — M6281 Muscle weakness (generalized): Secondary | ICD-10-CM | POA: Diagnosis not present

## 2022-05-25 DIAGNOSIS — G8929 Other chronic pain: Secondary | ICD-10-CM | POA: Diagnosis not present

## 2022-05-25 DIAGNOSIS — M170 Bilateral primary osteoarthritis of knee: Secondary | ICD-10-CM | POA: Insufficient documentation

## 2022-05-25 NOTE — Therapy (Signed)
OUTPATIENT PHYSICAL THERAPY LOWER EXTREMITY EVALUATION   Patient Name: Rita Bass MRN: [id number] DOB:08/26/1965, 57 y.o., female Today's Date: 05/25/2022  END OF SESSION:  PT End of Session - 05/25/22 1056     Visit Number 1    Date for PT Re-Evaluation 07/20/22    Authorization Type [employer]    PT Start Time 1100    PT Stop Time 1145    PT Time Calculation (min) 45 min    Activity Tolerance Patient tolerated treatment well    Behavior During Therapy WFL for tasks assessed/performed             Past Medical History:  Diagnosis Date   Arthritis    Asthma    Chronic pain of left knee    Complication of anesthesia    PONV   Diabetes ([place])    GERD (gastroesophageal reflux disease)    H/O bronchitis    Hepatic steatosis    Internal hemorrhoids    Kidney stones    Obesity    Renal disorder    Shortness of breath dyspnea    Sleep apnea    past issues - at weight over 400lbs   Sleep apnea in adult    Polysomnogram pending.  Followed by Dr. Halford Chessman   Past Surgical History:  Procedure Laterality Date   BIOPSY  11/01/2020   Procedure: BIOPSY;  Surgeon: Lavena Bullion, DO;  Location: [facility];  Service: Gastroenterology;;   BIOPSY  05/11/2021   Procedure: BIOPSY;  Surgeon: Lavena Bullion, DO;  Location: [facility];  Service: Gastroenterology;;   CESAREAN SECTION     6 c-sections   CHOLECYSTECTOMY     COLONOSCOPY WITH PROPOFOL N/A 05/11/2021   Procedure: COLONOSCOPY WITH PROPOFOL;  Surgeon: Lavena Bullion, DO;  Location: [facility];  Service: Gastroenterology;  Laterality: N/A;   ESOPHAGOGASTRODUODENOSCOPY (EGD) WITH PROPOFOL N/A 11/01/2020   Procedure: ESOPHAGOGASTRODUODENOSCOPY (EGD) WITH PROPOFOL;  Surgeon: Lavena Bullion, DO;  Location: [facility];  Service: Gastroenterology;  Laterality: N/A;   HERNIA REPAIR     KIDNEY STONE SURGERY     OOPHORECTOMY     TRANSTHORACIC ECHOCARDIOGRAM  09/2017   Technically difficult  study.  Did not use Definity contrast.  EF was 55 to 60% with moderate LVH and grade 1 diastolic dysfunction.  No significant valvular lesions noted.  No regional wall motion normality but difficult to assess due to poor imaging.    TUBAL LIGATION     UPPER GASTROINTESTINAL ENDOSCOPY     Patient Active Problem List   Diagnosis Date Noted   Type 2 diabetes mellitus with diabetic neuropathy, unspecified ([place]) 04/30/2022   Osteoarthritis 04/30/2022   Viral URI with cough 01/24/2022   Rash 01/15/2022   External hemorrhoid 09/15/2021   Generalized abdominal pain 08/30/2021   Ventral hernia 08/16/2021   Allergic conjunctivitis 07/28/2021   Grade II internal hemorrhoids    H. pylori infection 11/08/2020   Gastritis and gastroduodenitis    Lung nodule 10/26/2020   Ventral hernia without obstruction or gangrene 10/04/2020   Chronic diarrhea 09/16/2020   Abnormal Pap smear of cervix 08/18/2020   Hyperlipidemia associated with type 2 diabetes mellitus ([place]) 06/21/2020   Right hip pain 06/21/2020   Orthopnea 01/04/2020   Chronic cough 12/23/2019   Dyspepsia 05/22/2019   Essential hypertension 04/07/2019   Vaginal bleeding 01/27/2019   Low back pain 01/27/2019   BMI 50.0-59.9, adult ([place]) 123XX123   Diastolic dysfunction with heart failure ([place]) 10/25/2017  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   Diabetic nephropathy associated with type 2 diabetes mellitus ([place]) 10/25/2017   Type 2 diabetes mellitus with complication ([place]) [id number]   Sleep apnea 08/16/2017   Chronic pain of right knee 07/13/2016   Morbid obesity ([place]) 02/04/2015   Menorrhagia with regular cycle 11/18/2014    PCP: Arlyce Dice, MD  REFERRING PROVIDER: Kinnie Feil, MD  REFERRING DIAG: M17.0 (ICD-10-CM) - Osteoarthritis of both knees, unspecified osteoarthritis type  THERAPY DIAG:  Chronic pain of right knee  Pain in right hip  Chronic pain of left knee  Other abnormalities of gait and mobility  Muscle  weakness (generalized)  Rationale for Evaluation and Treatment: Rehabilitation  ONSET DATE: >1 year  SUBJECTIVE:   SUBJECTIVE STATEMENT: Pt reports problems with her knee. Pt was diagnosed with severe arthritis in a while. Pt states she was coming here for PT but then had stomach issues and had to hold (pt had received surgery). Pt reports she is not very mobile and the pain shoots up to her hip. Pt states she has occasions when she falls. Pt reports R knee is worse than L. Notes difficulty with stairs and stepping/out of bath tub. Reports she sometimes feels that a "bone is stuck."  PERTINENT HISTORY: Bowel/colon blockage, hernia, stomach surgeries  PAIN:  Are you having pain? Yes: NPRS scale: 7 currently, 20 at worst /10 Pain location: Front of R knee Pain description: Sharp dull shooting burning pain Aggravating factors: Hurts to stretch out leg, walking, stairs Relieving factors: Lots of creams, heating pads  PRECAUTIONS: Fall  WEIGHT BEARING RESTRICTIONS: No  FALLS:  Has patient fallen in last 6 months? Yes. 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 R patella more laterally oriented than L  PALPATION: TTP R quad  and hamstring, hypomobile patellas  LOWER EXTREMITY ROM:  Active ROM Right eval Left eval  Hip flexion    Hip extension    Hip abduction    Hip adduction    Hip internal rotation    Hip external rotation    Knee flexion 90 115  Knee extension -40 LAQ -10 LAQ  Ankle dorsiflexion    Ankle plantarflexion    Ankle inversion    Ankle eversion     (Blank rows = not tested)  LOWER EXTREMITY MMT:  MMT Right eval Left eval  Hip flexion 3 4  Hip extension 3- 4  Hip abduction 3- 4  Hip adduction    Hip internal rotation    Hip external rotation    Knee flexion 3+ 4  Knee extension 3+ 4  Ankle dorsiflexion    Ankle plantarflexion    Ankle inversion    Ankle eversion     (Blank rows = not tested)  LOWER EXTREMITY SPECIAL TESTS:  Hip special tests: Saralyn Pilar (FABER) test: positive  Unable to bend knee enough to test appropriately  FUNCTIONAL TESTS:  5 times sit to stand: 18.17 sec TUG: 21.02 sec  GAIT: Distance walked: 100' Assistive device utilized: None Level of assistance: SBA Comments: R LE externally rotated, increased pronation, wide BOS, lateral trunk lean (R more than L)   TODAY'S TREATMENT:                                                                                                                              DATE: 05/25/22  Trialed quad sets but too painful for pt See HEP below   PATIENT EDUCATION:  Education details: Exam findings, POC, initial HEP Person educated: Patient Education method: Explanation, Demonstration, and Handouts Education comprehension: verbalized understanding, returned demonstration, and needs further education  HOME EXERCISE PROGRAM: Access Code: [email or web address] URL: [email or web address] Date: 05/25/2022 Prepared by: Estill Bamberg April Thurnell Garbe  Exercises - Clamshell  - 1 x daily - 7 x weekly - 2 sets - 10 reps - Hooklying Gluteal Sets  - 1 x daily - 7 x weekly - 2 sets - 10 reps - Supine Heel Slide  - 1 x daily - 7 x  weekly - 2 sets - 10 reps  ASSESSMENT:  CLINICAL IMPRESSION: Patient is a 57 y.o. F who was seen today for physical therapy evaluation and treatment for R knee OA. Assessment significant for patellar malalignment, gross R LE weakness, decreased knee ROM, and pain affecting transfers, amb, and general mobility. Pt would benefit from PT to address these deficits. Discussed knee brace and aquatics. Pt defers aquatics for now but interested in knee brace.   OBJECTIVE IMPAIRMENTS: Abnormal gait, decreased activity tolerance, decreased balance, decreased endurance, decreased mobility, difficulty walking, decreased ROM, decreased strength, increased edema, increased fascial restrictions, increased muscle spasms, impaired flexibility, improper body mechanics, postural dysfunction, obesity, and pain.   ACTIVITY LIMITATIONS: lifting, bending, sitting, standing, squatting, stairs, transfers, bed mobility, bathing, toileting, hygiene/grooming, and locomotion level  PARTICIPATION LIMITATIONS: meal prep, cleaning, laundry, shopping, community activity, and yard work  PERSONAL FACTORS: Fitness, Past/current experiences, and Time since onset of injury/illness/exacerbation are also affecting patient's functional outcome.   REHAB POTENTIAL: Good  CLINICAL DECISION MAKING: Evolving/moderate complexity  EVALUATION COMPLEXITY: Moderate   GOALS: Goals reviewed with patient? Yes  SHORT TERM GOALS: Target date: 06/22/2022  Pt will be ind with initial HEP Baseline: Goal status: INITIAL  2.  Pt will be able to improve knee ROM by >/=10 deg Baseline:  Goal status: INITIAL   LONG TERM GOALS: Target date: 07/20/2022   Pt will be ind with management and progression of HEP Baseline:  Goal status: INITIAL  2.  Pt will be able to safely perform 3 steps with reciprocal pattern for home entry/exit Baseline:  Goal status: INITIAL  3.  Pt will have improved 5x STS to </=13 sec to demo increased functional LE  strength Baseline:  Goal status: INITIAL  4.  Pt will have improved TUG to </=13 sec to demo decreased fall risk Baseline:  Goal status: INITIAL  5.  Pt will have improved LEFS score to 22% to demo decreased disability Baseline:  Goal status: INITIAL     PLAN:  PT FREQUENCY: 2x/week  PT DURATION: 8 weeks  PLANNED INTERVENTIONS: Therapeutic exercises, Therapeutic activity, Neuromuscular re-education, Balance training, Gait training, Patient/Family education, Self Care, Joint mobilization, Stair training, DME instructions, Aquatic Therapy, Cryotherapy, Moist heat, Taping, and Manual therapy  PLAN FOR NEXT SESSION: Assess response to HEP. Continue to strengthen knee/hip as pt tolerates. Manual/stretching/ROM for knee.    Latice Waitman April Ma L Kyanne Rials, PT 05/25/2022, 11:51 AM   Check all possible CPT codes: [phone] - PT Re-evaluation, 97110- Therapeutic Exercise, [phone]- Neuro Re-education, [phone] - Gait Training, [phone] - Manual Therapy, [phone] - Therapeutic Activities, [phone] - Self Care, and [phone] - Aquatic therapy    Check all conditions that are expected to impact treatment: Musculoskeletal disorders   If treatment provided at initial evaluation, no treatment charged due to lack of authorization.

## 2022-05-25 NOTE — Telephone Encounter (Signed)
Patient calls nurse line requesting knee brace for right knee. She was seen at [facility] today and was recommended to wear a knee brace.   She states that this office does not have these braces available and told her to reach out to PCP.   If appropriate, please place order for DME knee brace and route back to RN team for processing.   Talbot Grumbling, RN

## 2022-05-27 ENCOUNTER — Other Ambulatory Visit: Payer: Self-pay | Admitting: Family Medicine

## 2022-05-27 DIAGNOSIS — E118 Type 2 diabetes mellitus with unspecified complications: Secondary | ICD-10-CM

## 2022-05-27 DIAGNOSIS — E1121 Type 2 diabetes mellitus with diabetic nephropathy: Secondary | ICD-10-CM

## 2022-05-29 ENCOUNTER — Ambulatory Visit (HOSPITAL_COMMUNITY): Payer: Medicaid Other

## 2022-05-29 ENCOUNTER — Other Ambulatory Visit: Payer: Self-pay

## 2022-05-29 DIAGNOSIS — R1033 Periumbilical pain: Secondary | ICD-10-CM

## 2022-05-29 DIAGNOSIS — R21 Rash and other nonspecific skin eruption: Secondary | ICD-10-CM

## 2022-05-29 MED ORDER — NYSTATIN 100000 UNIT/GM EX POWD: 1.0000 | Freq: Two times a day (BID) | CUTANEOUS | 0 refills | Status: DC

## 2022-05-29 NOTE — Addendum Note (Signed)
Addended by: Talbot Grumbling on: 05/29/2022 04:31 PM   Modules accepted: Orders

## 2022-05-29 NOTE — Addendum Note (Signed)
Addended by: Talbot Grumbling on: 05/29/2022 09:32 AM   Modules accepted: Orders

## 2022-05-29 NOTE — Addendum Note (Signed)
Addended by: Arlyce Dice on: 05/29/2022 05:05 PM   Modules accepted: Orders

## 2022-05-29 NOTE — Telephone Encounter (Signed)
Please place order as "DME other." Pended to this encounter.   Once signed, you can route back to [facility] RN team.   Thanks.   Talbot Grumbling, RN

## 2022-05-31 ENCOUNTER — Other Ambulatory Visit: Payer: Self-pay

## 2022-05-31 DIAGNOSIS — R1033 Periumbilical pain: Secondary | ICD-10-CM

## 2022-05-31 MED ORDER — TRAMADOL HCL 50 MG PO TABS: 50.0000 mg | ORAL_TABLET | Freq: Two times a day (BID) | ORAL | 0 refills | Status: DC | PRN

## 2022-05-31 NOTE — Telephone Encounter (Signed)
Community message sent to [employer] to see if this is an item they carry.   Will await response.   Talbot Grumbling, RN

## 2022-05-31 NOTE — Telephone Encounter (Signed)
Patient LVM on nurse line in regards to Tramadol.   She reports she is out and needs a refill.   Will forward to PCP.  Last fill date was 2/16.

## 2022-06-01 ENCOUNTER — Ambulatory Visit (HOSPITAL_COMMUNITY)
Admission: RE | Admit: 2022-06-01 | Discharge: 2022-06-01 | Disposition: A | Discharge status: Home / Self Care | Payer: Medicaid Other | Source: Ambulatory Visit | Attending: Family Medicine | Admitting: Family Medicine

## 2022-06-01 DIAGNOSIS — R918 Other nonspecific abnormal finding of lung field: Secondary | ICD-10-CM | POA: Diagnosis not present

## 2022-06-01 DIAGNOSIS — R911 Solitary pulmonary nodule: Secondary | ICD-10-CM | POA: Diagnosis not present

## 2022-06-01 MED ORDER — IOPAMIDOL (ISOVUE-370) INJECTION 76%
75.0000 mL | Freq: Once | INTRAVENOUS | Status: AC | PRN
  Administered 2022-06-01: 75 mL via INTRAVENOUS

## 2022-06-01 NOTE — Telephone Encounter (Signed)
Received confirmation from [employer]. They do carry this item and will begin processing knee brace.   Talbot Grumbling, RN

## 2022-06-06 ENCOUNTER — Encounter: Payer: Self-pay | Admitting: Family Medicine

## 2022-06-08 ENCOUNTER — Ambulatory Visit: Payer: Medicaid Other | Admitting: Physical Therapy

## 2022-06-13 ENCOUNTER — Ambulatory Visit: Payer: Medicaid Other | Admitting: Physical Therapy

## 2022-06-14 ENCOUNTER — Ambulatory Visit (INDEPENDENT_AMBULATORY_CARE_PROVIDER_SITE_OTHER): Payer: 59 | Admitting: Family Medicine

## 2022-06-14 ENCOUNTER — Other Ambulatory Visit: Payer: Self-pay

## 2022-06-14 ENCOUNTER — Encounter: Payer: Self-pay | Admitting: Family Medicine

## 2022-06-14 VITALS — BP 127/77 | HR 92 | Wt 313.2 lb

## 2022-06-14 DIAGNOSIS — R1084 Generalized abdominal pain: Secondary | ICD-10-CM

## 2022-06-14 DIAGNOSIS — A048 Other specified bacterial intestinal infections: Secondary | ICD-10-CM

## 2022-06-14 DIAGNOSIS — G8929 Other chronic pain: Secondary | ICD-10-CM

## 2022-06-14 DIAGNOSIS — R911 Solitary pulmonary nodule: Secondary | ICD-10-CM | POA: Diagnosis not present

## 2022-06-14 DIAGNOSIS — M179 Osteoarthritis of knee, unspecified: Secondary | ICD-10-CM | POA: Diagnosis not present

## 2022-06-14 MED ORDER — DULOXETINE HCL 20 MG PO CPEP: 20.0000 mg | ORAL_CAPSULE | Freq: Every day | ORAL | 3 refills | Status: DC

## 2022-06-14 MED ORDER — GABAPENTIN 100 MG PO CAPS: 400.0000 mg | ORAL_CAPSULE | Freq: Three times a day (TID) | ORAL | 2 refills | Status: DC

## 2022-06-14 NOTE — Patient Instructions (Addendum)
Good to see you today - Thank you for coming in  Things we discussed today:  1) Let's check your knee Xray for both knees  2) For your stomach pain  - We are checking labs for your liver and pancreas  - If you H pylori infection is not gone, we will do another course of the antibiotics.  Please always bring your medication bottles  Come back to see me in 2 months

## 2022-06-14 NOTE — Progress Notes (Signed)
    SUBJECTIVE:   CHIEF COMPLAINT / HPI:   DM is a 57yo F presenting for f/u  Abm Pain Reports ongoing chronic abm pain (periumbilical). Has successfully completed another round of treatment for H pylori.  OA Going to PT. PT is asking for her to get BL knee XR to further guide treatment. Also recommending her tog et knee brace and walker.   Lung nodule Discussed results from prior CT were stable and benign.   PERTINENT  PMH / PSH:  External hemorrhoids  Ventral hernia repair (5/23) Cholecystectomy   OBJECTIVE:   BP 127/77   Pulse 92   Wt (!) 313 lb 3.2 oz (142.1 kg)   LMP 04/05/2016   SpO2 95%   BMI 55.48 kg/m   Gen: [place], alert, NAD HEENT: NCAT. MMM Resp: Normal WOB on RA CV: RRR Abm: soft, tender in periumbilical area. No peritoneal signs  ASSESSMENT/PLAN:   H. pylori infection Completed second round of H pylori treatment. Reports good adherence and following proper medication directions. Suspect that pt's chronic abm pain is partially due to H pylori infxn, but also multifactorial including hx of extensive abm surgery, hx of hernia, chronic pain medication dependence. Last finished course >14days ago, test of cure unfortunately still positive. - Start taking Pylera 3capsules four times a day for 14 days.  - Start taking Omeprazole 1 tablet two times a day for 14 days. - CMP, lipase today  Osteoarthritis BL knee XR per PT recs.  Lung nodule Benign-appearing LLL nodule, stable since 2016. No smoking hx. No further surveillance recommended.     Arlyce Dice, MD [place]

## 2022-06-15 ENCOUNTER — Ambulatory Visit (HOSPITAL_COMMUNITY)
Admission: RE | Admit: 2022-06-15 | Discharge: 2022-06-15 | Disposition: A | Discharge status: Home / Self Care | Payer: 59 | Source: Ambulatory Visit | Attending: Family Medicine | Admitting: Family Medicine

## 2022-06-15 DIAGNOSIS — M179 Osteoarthritis of knee, unspecified: Secondary | ICD-10-CM | POA: Diagnosis not present

## 2022-06-15 DIAGNOSIS — M1712 Unilateral primary osteoarthritis, left knee: Secondary | ICD-10-CM | POA: Diagnosis not present

## 2022-06-15 DIAGNOSIS — M1711 Unilateral primary osteoarthritis, right knee: Secondary | ICD-10-CM | POA: Diagnosis not present

## 2022-06-16 ENCOUNTER — Encounter: Payer: Self-pay | Admitting: Family Medicine

## 2022-06-16 LAB — COMPREHENSIVE METABOLIC PANEL
ALT: 16 IU/L (ref 0–32)
AST: 15 IU/L (ref 0–40)
Albumin/Globulin Ratio: 1.3 (ref 1.2–2.2)
Albumin: 4.3 g/dL (ref 3.8–4.9)
Alkaline Phosphatase: 101 IU/L (ref 44–121)
BUN/Creatinine Ratio: 13 (ref 9–23)
BUN: 12 mg/dL (ref 6–24)
Bilirubin Total: 0.3 mg/dL (ref 0.0–1.2)
CO2: 24 mmol/L (ref 20–29)
Calcium: 9.3 mg/dL (ref 8.7–10.2)
Chloride: 99 mmol/L (ref 96–106)
Creatinine, Ser: 0.9 mg/dL (ref 0.57–1.00)
Globulin, Total: 3.2 g/dL (ref 1.5–4.5)
Glucose: 220 mg/dL — ABNORMAL HIGH (ref 70–99)
Potassium: 4.5 mmol/L (ref 3.5–5.2)
Sodium: 141 mmol/L (ref 134–144)
Total Protein: 7.5 g/dL (ref 6.0–8.5)
eGFR: 76 mL/min/{1.73_m2} (ref >59)

## 2022-06-16 LAB — H. PYLORI BREATH TEST: H pylori Breath Test: POSITIVE — AB

## 2022-06-16 LAB — LIPASE: Lipase: 31 U/L (ref 14–72)

## 2022-06-16 MED ORDER — OMEPRAZOLE 20 MG PO CPDR: 20.0000 mg | DELAYED_RELEASE_CAPSULE | Freq: Two times a day (BID) | ORAL | 0 refills | Status: DC

## 2022-06-16 MED ORDER — BISMUTH/METRONIDAZ/TETRACYCLIN 140-125-125 MG PO CAPS: 3.0000 | ORAL_CAPSULE | Freq: Four times a day (QID) | ORAL | 0 refills | Status: DC

## 2022-06-16 NOTE — Assessment & Plan Note (Signed)
Completed second round of H pylori treatment. Reports good adherence and following proper medication directions. Suspect that pt's chronic abm pain is partially due to H pylori infxn, but also multifactorial including hx of extensive abm surgery, hx of hernia, chronic pain medication dependence. Last finished course >14days ago, test of cure unfortunately still positive. - Start taking Pylera 3capsules four times a day for 14 days.  - Start taking Omeprazole 1 tablet two times a day for 14 days. - CMP, lipase today

## 2022-06-16 NOTE — Assessment & Plan Note (Signed)
Benign-appearing LLL nodule, stable since 2016. No smoking hx. No further surveillance recommended.

## 2022-06-16 NOTE — Assessment & Plan Note (Signed)
BL knee XR per PT recs.

## 2022-06-20 ENCOUNTER — Ambulatory Visit: Payer: Medicaid Other | Attending: Family Medicine | Admitting: Physical Therapy

## 2022-06-20 DIAGNOSIS — M25562 Pain in left knee: Secondary | ICD-10-CM | POA: Insufficient documentation

## 2022-06-20 DIAGNOSIS — M6281 Muscle weakness (generalized): Secondary | ICD-10-CM | POA: Diagnosis not present

## 2022-06-20 DIAGNOSIS — G8929 Other chronic pain: Secondary | ICD-10-CM | POA: Insufficient documentation

## 2022-06-20 DIAGNOSIS — M25551 Pain in right hip: Secondary | ICD-10-CM | POA: Diagnosis not present

## 2022-06-20 DIAGNOSIS — R2689 Other abnormalities of gait and mobility: Secondary | ICD-10-CM | POA: Diagnosis not present

## 2022-06-20 DIAGNOSIS — M25561 Pain in right knee: Secondary | ICD-10-CM | POA: Insufficient documentation

## 2022-06-20 NOTE — Therapy (Signed)
OUTPATIENT PHYSICAL THERAPY LOWER EXTREMITY TREATMENT   Patient Name: Rita Bass MRN: [id number] DOB:1965/12/30, 57 y.o., female Today's Date: 06/20/2022  END OF SESSION:  PT End of Session - 06/20/22 1639     Visit Number 2    Number of Visits 16    Date for PT Re-Evaluation 07/20/22    Authorization Type [employer]    PT Start Time 1635    PT Stop Time [id number]    PT Time Calculation (min) 40 min    Activity Tolerance Patient tolerated treatment well    Behavior During Therapy WFL for tasks assessed/performed              Past Medical History:  Diagnosis Date   Arthritis    Asthma    Chronic pain of left knee    Complication of anesthesia    PONV   Diabetes ([place])    GERD (gastroesophageal reflux disease)    H/O bronchitis    Hepatic steatosis    Internal hemorrhoids    Kidney stones    Obesity    Renal disorder    Shortness of breath dyspnea    Sleep apnea    past issues - at weight over 400lbs   Sleep apnea in adult    Polysomnogram pending.  Followed by Dr. Halford Chessman   Past Surgical History:  Procedure Laterality Date   BIOPSY  11/01/2020   Procedure: BIOPSY;  Surgeon: Lavena Bullion, DO;  Location: [facility];  Service: Gastroenterology;;   BIOPSY  05/11/2021   Procedure: BIOPSY;  Surgeon: Lavena Bullion, DO;  Location: [facility];  Service: Gastroenterology;;   CESAREAN SECTION     6 c-sections   CHOLECYSTECTOMY     COLONOSCOPY WITH PROPOFOL N/A 05/11/2021   Procedure: COLONOSCOPY WITH PROPOFOL;  Surgeon: Lavena Bullion, DO;  Location: [facility];  Service: Gastroenterology;  Laterality: N/A;   ESOPHAGOGASTRODUODENOSCOPY (EGD) WITH PROPOFOL N/A 11/01/2020   Procedure: ESOPHAGOGASTRODUODENOSCOPY (EGD) WITH PROPOFOL;  Surgeon: Lavena Bullion, DO;  Location: [facility];  Service: Gastroenterology;  Laterality: N/A;   HERNIA REPAIR     KIDNEY STONE SURGERY     OOPHORECTOMY     TRANSTHORACIC ECHOCARDIOGRAM  09/2017    Technically difficult study.  Did not use Definity contrast.  EF was 55 to 60% with moderate LVH and grade 1 diastolic dysfunction.  No significant valvular lesions noted.  No regional wall motion normality but difficult to assess due to poor imaging.    TUBAL LIGATION     UPPER GASTROINTESTINAL ENDOSCOPY     Patient Active Problem List   Diagnosis Date Noted   Type 2 diabetes mellitus with diabetic neuropathy, unspecified 04/30/2022   Osteoarthritis 04/30/2022   Viral URI with cough 01/24/2022   Rash 01/15/2022   External hemorrhoid 09/15/2021   Generalized abdominal pain 08/30/2021   Ventral hernia 08/16/2021   Allergic conjunctivitis 07/28/2021   Grade II internal hemorrhoids    H. 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  Rationale for Evaluation and Treatment: Rehabilitation  ONSET DATE: >1 year  SUBJECTIVE:   SUBJECTIVE STATEMENT: Pt reports she  got x-rays done for both knees. Pt reports knees are hurting this afternoon. Pt states she's had a stomach infection/bacteria and has been dealing with that so has not been consistent with her HEP.  PERTINENT HISTORY: Bowel/colon blockage, hernia, stomach surgeries From eval: Pt reports problems with her knee. Pt was diagnosed with severe arthritis in a while. Pt states she was coming here for PT but then had stomach issues and had to hold (pt had received surgery). Pt reports she is not very mobile and the pain shoots up to her hip. Pt states she has occasions when she falls. Pt reports R knee is worse than L. Notes difficulty with stairs and stepping/out of bath tub. Reports she sometimes feels that a "bone is stuck."  PAIN:  Are you having pain? Yes: NPRS scale: 7 currently, 20 at worst /10 Pain location: Front of R knee Pain description: Sharp dull shooting burning pain Aggravating factors: Hurts to stretch out leg, walking, stairs Relieving factors: Lots of creams, heating pads  PRECAUTIONS: Fall  WEIGHT BEARING RESTRICTIONS: No  FALLS:  Has patient fallen in last 6 months? Yes. 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 R  patella more laterally oriented than L  PALPATION: TTP R quad and hamstring, hypomobile patellas  LOWER EXTREMITY ROM:  Active ROM Right eval Left eval  Hip flexion    Hip extension    Hip abduction    Hip adduction    Hip internal rotation    Hip external rotation    Knee flexion 90 115  Knee extension -40 LAQ -10 LAQ  Ankle dorsiflexion    Ankle plantarflexion    Ankle inversion    Ankle eversion     (Blank rows = not tested)  LOWER EXTREMITY MMT:  MMT Right eval Left eval  Hip flexion 3 4  Hip extension 3- 4  Hip abduction 3- 4  Hip adduction    Hip internal rotation    Hip external rotation    Knee flexion 3+ 4  Knee extension 3+ 4  Ankle dorsiflexion    Ankle plantarflexion    Ankle inversion  Ankle eversion     (Blank rows = not tested)  LOWER EXTREMITY SPECIAL TESTS:  Hip special tests: Saralyn Pilar (FABER) test: positive  Unable to bend knee enough to test appropriately  FUNCTIONAL TESTS:  5 times sit to stand: 18.17 sec TUG: 21.02 sec  GAIT: Distance walked: 100' Assistive device utilized: None Level of assistance: SBA Comments: R LE externally rotated, increased pronation, wide BOS, lateral trunk lean (R more than L)   TODAY'S TREATMENT:       06/20/22 Therapeutic Exercise: - Nustep L5 x 5 min UEs/LEs - Sidelying hip abduction 2x10 on L, clamshell on R - Sidelying TFL stretch x 1 min - Bridge x10 - SAQ 2x10 - SLR with foot propped on bolster 2x10 - Hamstring curl with foot on foam roll 2x10 - Sitting heel/toe raise 2x10 - Sit<>stand x8 with focus on decreasing knee valgus                                                                                                                          DATE: 05/25/22  Trialed quad sets but too painful for pt See HEP below   PATIENT EDUCATION:  Education details: Exam findings, POC, initial HEP Person educated: Patient Education method: Explanation, Demonstration, and Handouts Education  comprehension: verbalized understanding, returned demonstration, and needs further education  HOME EXERCISE PROGRAM: Access Code: [email or web address] URL: [email or web address] Date: 06/20/2022 Prepared by: Estill Bamberg April Thurnell Garbe  Exercises - Sidelying Hip Abduction  - 1 x daily - 7 x weekly - 2 sets - 10 reps - Clamshell  - 1 x daily - 7 x weekly - 2 sets - 10 reps - Beginner Bridge  - 1 x daily - 7 x weekly - 2 sets - 10 reps - Supine Short Arc Quad  - 1 x daily - 7 x weekly - 2 sets - 10 reps - 3 sec hold - Supine Straight Leg Raise  - 1 x daily - 7 x weekly - 2 sets - 10 reps  ASSESSMENT:  CLINICAL IMPRESSION: Treatment focused on reviewing and modifying HEP. Difficulty with clamshell on L LE without increased medial knee pain -- better tolerated sidelying hip abd. Pt too weak to perform sidelying hip abd on R -- better tolerated clamshell. Focused primarily on quad strengthening as pt tolerated.   OBJECTIVE IMPAIRMENTS: Abnormal gait, decreased activity tolerance, decreased balance, decreased endurance, decreased mobility, difficulty walking, decreased ROM, decreased strength, increased edema, increased fascial restrictions, increased muscle spasms, impaired flexibility, improper body mechanics, postural dysfunction, obesity, and pain.   ACTIVITY LIMITATIONS: lifting, bending, sitting, standing, squatting, stairs, transfers, bed mobility, bathing, toileting, hygiene/grooming, and locomotion level  PARTICIPATION LIMITATIONS: meal prep, cleaning, laundry, shopping, community activity, and yard work  PERSONAL FACTORS: Fitness, Past/current experiences, and Time since onset of injury/illness/exacerbation are also affecting patient's functional outcome.   REHAB POTENTIAL: Good  CLINICAL DECISION MAKING: Evolving/moderate complexity  EVALUATION COMPLEXITY: Moderate   GOALS: Goals reviewed with patient? Yes  SHORT TERM  GOALS: Target date: 06/22/2022  Pt will be ind with  initial HEP Baseline: Goal status: INITIAL  2.  Pt will be able to improve knee ROM by >/=10 deg Baseline:  Goal status: INITIAL   LONG TERM GOALS: Target date: 07/20/2022   Pt will be ind with management and progression of HEP Baseline:  Goal status: INITIAL  2.  Pt will be able to safely perform 3 steps with reciprocal pattern for home entry/exit Baseline:  Goal status: INITIAL  3.  Pt will have improved 5x STS to </=13 sec to demo increased functional LE strength Baseline:  Goal status: INITIAL  4.  Pt will have improved TUG to </=13 sec to demo decreased fall risk Baseline:  Goal status: INITIAL  5.  Pt will have improved LEFS score to 22% to demo decreased disability Baseline:  Goal status: INITIAL     PLAN:  PT FREQUENCY: 2x/week  PT DURATION: 8 weeks  PLANNED INTERVENTIONS: Therapeutic exercises, Therapeutic activity, Neuromuscular re-education, Balance training, Gait training, Patient/Family education, Self Care, Joint mobilization, Stair training, DME instructions, Aquatic Therapy, Cryotherapy, Moist heat, Taping, and Manual therapy  PLAN FOR NEXT SESSION: Assess response to HEP. Continue to strengthen knee/hip as pt tolerates. Manual/stretching/ROM for knee.    Rickayla Wieland April Ma L Iver Fehrenbach, PT 06/20/2022, 4:40 PM

## 2022-06-21 DIAGNOSIS — G4733 Obstructive sleep apnea (adult) (pediatric): Secondary | ICD-10-CM | POA: Diagnosis not present

## 2022-06-22 ENCOUNTER — Ambulatory Visit: Payer: Medicaid Other

## 2022-06-24 ENCOUNTER — Ambulatory Visit (HOSPITAL_COMMUNITY)
Admission: EM | Admit: 2022-06-24 | Discharge: 2022-06-24 | Disposition: A | Discharge status: Home / Self Care | Payer: Medicaid Other

## 2022-06-24 ENCOUNTER — Encounter (HOSPITAL_COMMUNITY): Payer: Self-pay

## 2022-06-24 DIAGNOSIS — G4489 Other headache syndrome: Secondary | ICD-10-CM

## 2022-06-24 DIAGNOSIS — R221 Localized swelling, mass and lump, neck: Secondary | ICD-10-CM

## 2022-06-24 MED ORDER — KETOROLAC TROMETHAMINE 30 MG/ML IJ SOLN
30.0000 mg | Freq: Once | INTRAMUSCULAR | Status: AC
  Administered 2022-06-24: 30 mg via INTRAMUSCULAR

## 2022-06-24 MED ORDER — KETOROLAC TROMETHAMINE 30 MG/ML IJ SOLN
INTRAMUSCULAR | Status: AC
  Filled 2022-06-24: qty 1

## 2022-06-24 MED ORDER — IBUPROFEN 800 MG PO TABS: 800.0000 mg | ORAL_TABLET | Freq: Three times a day (TID) | ORAL | 0 refills | Status: DC

## 2022-06-24 MED ORDER — DEXAMETHASONE SODIUM PHOSPHATE 10 MG/ML IJ SOLN
10.0000 mg | Freq: Once | INTRAMUSCULAR | Status: AC
  Administered 2022-06-24: 10 mg via INTRAMUSCULAR

## 2022-06-24 MED ORDER — DEXAMETHASONE SODIUM PHOSPHATE 10 MG/ML IJ SOLN
INTRAMUSCULAR | Status: AC
  Filled 2022-06-24: qty 1

## 2022-06-24 NOTE — Discharge Instructions (Addendum)
Advised to call [facility] for an appointment to have them evaluate the nodule on the left side of the neck.  Advised take Anaprox DS 550 mg every 12 hours as needed for headache.  Advised follow-up PCP return to urgent care as needed.

## 2022-06-24 NOTE — ED Provider Notes (Signed)
[facility]    CSN: [id number] Arrival date & time: 06/24/22  1500      History   Chief Complaint Chief Complaint  Patient presents with   Headache    HPI Rita Bass is a 57 y.o. female.   57 year old female presents with headache and not on the left side of her neck.  Patient indicates that this morning she woke up with a headache at 8:00.  She indicates that headache is all over the head but mainly is in the front and both sides, sharp, squeezing.  Patient indicates that she has taken some Tylenol with mild relief the headache.  She indicates she is not having any vision changes, photophobia, nausea or vomiting.  She indicates that the headache is worse when she pushes on a knot that is on the left backside of the neck.  She indicates that the knot was there a year ago and then it resolved but it has since returned.  Patient indicates that it has been there for the past week.  She indicates it seems to be growing and enlarging, she relates it is painful when she pushes on it and it makes her headache worse.  She indicates that there is been no drainage from the area, she is not running fever or chills.   Headache   Past Medical History:  Diagnosis Date   Arthritis    Asthma    Chronic pain of left knee    Complication of anesthesia    PONV   Diabetes    GERD (gastroesophageal reflux disease)    H/O bronchitis    Hepatic steatosis    Internal hemorrhoids    Kidney stones    Obesity    Renal disorder    Shortness of breath dyspnea    Sleep apnea    past issues - at weight over 400lbs   Sleep apnea in adult    Polysomnogram pending.  Followed by Dr. Craige Cotta    Patient Active Problem List   Diagnosis Date Noted   Type 2 diabetes mellitus with diabetic neuropathy, unspecified 04/30/2022   Osteoarthritis 04/30/2022   Viral URI with cough 01/24/2022   Rash 01/15/2022   External hemorrhoid 09/15/2021   Generalized abdominal pain 08/30/2021   Ventral  hernia 08/16/2021   Allergic conjunctivitis 07/28/2021   Grade II internal hemorrhoids    H. 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   Diabetic nephropathy associated with type 2 diabetes mellitus 10/25/2017   Type 2 diabetes mellitus with complication 09/13/2017   Sleep apnea 08/16/2017   Chronic pain of right knee 07/13/2016   Morbid obesity 02/04/2015   Menorrhagia with regular cycle 11/18/2014    Past Surgical History:  Procedure Laterality Date   BIOPSY  11/01/2020   Procedure: BIOPSY;  Surgeon: Shellia Cleverly, DO;  Location: [facility];  Service: Gastroenterology;;   BIOPSY  05/11/2021   Procedure: BIOPSY;  Surgeon: Shellia Cleverly, DO;  Location: [facility];  Service: Gastroenterology;;   CESAREAN SECTION     6 c-sections   CHOLECYSTECTOMY     COLONOSCOPY WITH PROPOFOL N/A 05/11/2021   Procedure: COLONOSCOPY WITH PROPOFOL;  Surgeon: Shellia Cleverly, DO;  Location: [facility];  Service: Gastroenterology;  Laterality: N/A;   ESOPHAGOGASTRODUODENOSCOPY (EGD) WITH PROPOFOL N/A 11/01/2020   Procedure: ESOPHAGOGASTRODUODENOSCOPY (EGD) WITH PROPOFOL;  Surgeon: Shellia Cleverly, DO;  Location: [facility];  Service: Gastroenterology;  Laterality: N/A;   HERNIA REPAIR     KIDNEY STONE SURGERY     OOPHORECTOMY     TRANSTHORACIC ECHOCARDIOGRAM  09/2017   Technically difficult study.  Did not use Definity  contrast.  EF was 55 to 60% with moderate LVH and grade 1 diastolic dysfunction.  No significant valvular lesions noted.  No regional wall motion normality but difficult to assess due to poor imaging.    TUBAL LIGATION     UPPER GASTROINTESTINAL ENDOSCOPY      OB History     Gravida  6   Para  6   Term  5   Preterm  1   AB  0   Living  7      SAB  0   IAB  0   Ectopic  0   Multiple  2   Live Births               Home Medications    Prior to Admission medications   Medication Sig Start Date End Date Taking? Authorizing Provider  ACCU-CHEK GUIDE test strip TEST ONCE DAILY Patient taking differently: Last glucose: 137 yesterday (06-23-2022) 04/10/21  Yes Jackelyn Poling, DO  Accu-Chek Softclix Lancets lancets Use as instructed 11/25/20  Yes Hensel, Santiago Bumpers, MD  acetaminophen (TYLENOL) 650 MG CR tablet Take 650-1,300 mg by mouth every 8 (eight) hours as needed for pain. Last dose (2) around 0930 am.   Yes [provider]  albuterol (PROVENTIL) (2.5 MG/3ML) 0.083% nebulizer solution Take 2.5 mg by nebulization every 6 (six) hours as needed. 01/26/22  Yes [provider]  Sherrie Sport COVID-19 AG HOME TEST KIT TEST AS DIRECTED TODAY 05/14/22  Yes [provider]  DULoxetine (CYMBALTA) 20 MG capsule Take 1 capsule (20 mg total) by mouth daily. 06/14/22  Yes Lincoln Brigham, MD  empagliflozin (JARDIANCE) 10 MG TABS tablet Take 1 tablet (10 mg total) by mouth daily. 04/27/22  Yes Lincoln Brigham, MD  gabapentin (NEURONTIN) 100 MG capsule Take 4 capsules (400 mg total) by mouth 3 (three) times daily. 06/14/22 09/12/22 Yes Lincoln Brigham, MD  metFORMIN (GLUCOPHAGE-XR) 500 MG 24 hr tablet Take 1 tablet (500 mg total) by mouth daily. 05/03/22  Yes Lincoln Brigham, MD  traMADol (ULTRAM) 50 MG tablet Take 1 tablet (50 mg total) by mouth every 12 (twelve) hours as needed. Not to be refilled before 30 days 05/31/22  Yes Lincoln Brigham, MD  albuterol (PROVENTIL) (5 MG/ML) 0.5%  nebulizer solution Take 0.5 mLs (2.5 mg total) by nebulization every 6 (six) hours as needed for wheezing or shortness of breath. 01/26/22   Piontek, Denny Peon, MD  AMBULATORY NON FORMULARY MEDICATION Medication Name: Diltiazem 2%/Lidocaine 5%- Using your index finger, apply a small amount of medication inside the rectum up to your first knuckle/joint three times daily x 8 weeks. Patient not taking: Reported on 01/15/2022 09/22/21   Doree Albee, PA-C  azithromycin (ZITHROMAX) 250 MG tablet Take 1 tablet (250 mg total) by mouth daily. Take first 2 tablets together, then  1 every day until finished. 01/26/22   Piontek, Denny Peon, MD  benzonatate (TESSALON PERLES) 100 MG capsule Take 2 capsules (200 mg total) by mouth 3 (three) times daily as needed for cough. 02/01/22   Lincoln Brigham, MD  Bismuth/Metronidaz/Tetracyclin [facility]) [id number] MG CAPS Take 3 capsules by mouth in the morning, at noon, in the evening, and at bedtime for 14 days. 06/16/22 06/30/22  Lincoln Brigham, MD  cetirizine (ZYRTEC) 10 MG tablet Take 1 tablet (10 mg total) by mouth daily. Patient not taking: Reported on 10/05/2021 07/28/21   Sabino Dick, DO  fluticasone (FLONASE) 50 MCG/ACT nasal spray Place 2 sprays into both nostrils daily. Patient not taking: Reported on 01/15/2022 07/25/21   Jackelyn Poling, DO  guaiFENesin (MUCINEX) 600 MG 12 hr tablet Take 1 tablet (600 mg total) by mouth 2 (two) times daily as needed for cough or to loosen phlegm. 02/01/22   Lincoln Brigham, MD  hydrocortisone (ANUSOL-HC) 2.5 % rectal cream Place 1 Application rectally 2 (two) times daily. Patient not taking: Reported on 01/15/2022 10/30/21   Lincoln Brigham, MD  ibuprofen (ADVIL) 800 MG tablet Take 1 tablet (800 mg total) by mouth 3 (three) times daily. For headache. 06/24/22  Yes Ellsworth Lennox, PA-C  Insulin Pen Needle (PEN NEEDLES) 32G X 5 MM MISC Use as directed with victoza 11/25/20   Moses Manners, MD  lisinopril (ZESTRIL) 10 MG tablet TAKE 1 TABLET(10 MG) BY  MOUTH DAILY 05/29/22   Lincoln Brigham, MD  NON FORMULARY Diltiazem 2%/Lidocaine5% compound Use 3 x rectally daily for 2 months to heal anal fissure Patient not taking: Reported on 01/15/2022 09/22/21   Doree Albee, PA-C  nystatin (MYCOSTATIN/NYSTOP) powder Apply 1 Application topically 2 (two) times daily. 05/29/22   Lincoln Brigham, MD  olopatadine (PATANOL) 0.1 % ophthalmic solution Place 1 drop into both eyes 2 (two) times daily. Patient not taking: Reported on 09/22/2021 07/28/21   Sabino Dick, DO  omeprazole (PRILOSEC) 20 MG capsule Take 1 capsule (20 mg total) by mouth 2 (two) times daily. Take for 14 days. 06/16/22   Lincoln Brigham, MD  ondansetron (ZOFRAN) 4 MG tablet Take 1 tablet (4 mg total) by mouth every 8 (eight) hours as needed for nausea or vomiting. Patient not taking: Reported on 01/15/2022 06/16/21   Jackelyn Poling, DO    Family History Family History  Problem Relation Age of Onset   Breast cancer Mother    Other Father        committed suicide   Diabetes Father    Heart attack Maternal Aunt    Colon cancer Neg Hx    Esophageal cancer Neg Hx    Pancreatic cancer Neg Hx    Stomach cancer Neg Hx    Liver disease Neg Hx    Colon polyps Neg Hx    Rectal cancer Neg Hx     Social History Social History   Tobacco Use   Smoking status: Never   Smokeless tobacco: Never  Vaping Use   Vaping Use: Never used  Substance Use Topics   Alcohol use: No   Drug use: No     Allergies   Patient has no known allergies.   Review of Systems Review of Systems  Neurological:  Positive for headaches.     Physical Exam Triage Vital Signs ED Triage Vitals  Enc Vitals Group     BP 06/24/22 1651 (!) 148/95     Pulse Rate 06/24/22 1651 93     Resp 06/24/22 1651  18     Temp 06/24/22 1651 98.1 F (36.7 C)     Temp Source 06/24/22 1651 Oral     SpO2 06/24/22 1651 99 %     Weight 06/24/22 1647 (!) 308 lb (139.7 kg)     Height 06/24/22 1647 5\' 4"  (1.626 m)     Head  Circumference --      Peak Flow --      Pain Score 06/24/22 1646 10     Pain Loc --      Pain Edu? --      Excl. in GC? --    No data found.  Updated Vital Signs BP 135/82 (BP Location: Left Arm)   Pulse 93   Temp 98.1 F (36.7 C) (Oral)   Resp 18   Ht 5\' 4"  (1.626 m)   Wt (!) 308 lb (139.7 kg)   LMP 04/05/2016   SpO2 99%   BMI 52.87 kg/m   Visual Acuity Right Eye Distance:   Left Eye Distance:   Bilateral Distance:    Right Eye Near:   Left Eye Near:    Bilateral Near:     Physical Exam Constitutional:      Appearance: She is well-developed.  HENT:     Right Ear: Tympanic membrane and ear canal normal.     Left Ear: Tympanic membrane and ear canal normal.     Mouth/Throat:     Mouth: Mucous membranes are moist.     Pharynx: Oropharynx is clear.  Neck:      Comments: Neck: There is a 1 x 2 cm nodular mass area on the midportion of the left side of the neck adjacent to midline in the middle of the neck.  There is no swelling or redness, there is tenderness on palpation of the area, it is not mobile. Cardiovascular:     Rate and Rhythm: Normal rate and regular rhythm.     Heart sounds: Normal heart sounds.  Pulmonary:     Effort: Pulmonary effort is normal.     Breath sounds: Normal breath sounds and air entry. No wheezing, rhonchi or rales.  Neurological:     Mental Status: She is alert.      UC Treatments / Results  Labs (all labs ordered are listed, but only abnormal results are displayed) Labs Reviewed - No data to display  EKG   Radiology No results found.  Procedures Procedures (including critical care time)  Medications Ordered in UC Medications  ketorolac (TORADOL) 30 MG/ML injection 30 mg (has no administration in time range)  dexamethasone (DECADRON) injection 10 mg (has no administration in time range)    Initial Impression / Assessment and Plan / UC Course  I have reviewed the triage vital signs and the nursing notes.  Pertinent  labs & imaging results that were available during my care of the patient were reviewed by me and considered in my medical decision making (see chart for details).    Plan: The diagnosis to be treated with the following: 1.  Headache: A.  Headache medication given IM.  (Toradol and dexamethasone) B.  Advised to take ibuprofen 800 mg as needed for headache. 2.  Left side neck mass: A.  Patient advised to call into [place] [facility] for an appointment to have the area evaluated and possibly removed. 3.  Advised follow-up PCP return to urgent care as needed. Final Clinical Impressions(s) / UC Diagnoses   Final diagnoses:  Other headache syndrome  Neck mass  Discharge Instructions      Advised to call [facility] [place] [facility] for an appointment to have them evaluate the nodule on the left side of the neck.  Advised take Anaprox DS 550 mg every 12 hours as needed for headache.  Advised follow-up PCP return to urgent care as needed.    ED Prescriptions     Medication Sig Dispense Auth. Provider   ibuprofen (ADVIL) 800 MG tablet Take 1 tablet (800 mg total) by mouth 3 (three) times daily. For headache. 15 tablet Ellsworth Lennox, PA-C      PDMP not reviewed this encounter.   Ellsworth Lennox, PA-C 06/24/22 1719

## 2022-06-24 NOTE — ED Triage Notes (Signed)
Started around "8am today" and remains. Some relief with Tylenol "but coming back". Seems to be all over head "mainly in temporal, forehead". Neck hurts at times. No nausea or vomiting. No cold symptoms. No history of (known) Migraines. No visual disturbance.

## 2022-06-25 ENCOUNTER — Encounter: Payer: Self-pay | Admitting: Family Medicine

## 2022-06-25 ENCOUNTER — Ambulatory Visit (INDEPENDENT_AMBULATORY_CARE_PROVIDER_SITE_OTHER): Payer: Medicaid Other | Admitting: Family Medicine

## 2022-06-25 ENCOUNTER — Other Ambulatory Visit: Payer: Self-pay

## 2022-06-25 VITALS — BP 142/92 | HR 96 | Ht 64.0 in | Wt 317.0 lb

## 2022-06-25 DIAGNOSIS — E118 Type 2 diabetes mellitus with unspecified complications: Secondary | ICD-10-CM | POA: Diagnosis not present

## 2022-06-25 DIAGNOSIS — E1121 Type 2 diabetes mellitus with diabetic nephropathy: Secondary | ICD-10-CM

## 2022-06-25 DIAGNOSIS — A048 Other specified bacterial intestinal infections: Secondary | ICD-10-CM | POA: Diagnosis not present

## 2022-06-25 DIAGNOSIS — M1990 Unspecified osteoarthritis, unspecified site: Secondary | ICD-10-CM

## 2022-06-25 DIAGNOSIS — R599 Enlarged lymph nodes, unspecified: Secondary | ICD-10-CM | POA: Diagnosis not present

## 2022-06-25 LAB — POCT GLYCOSYLATED HEMOGLOBIN (HGB A1C): HbA1c, POC (controlled diabetic range): 8.8 % — AB (ref 0.0–7.0)

## 2022-06-25 MED ORDER — AMOXICILLIN 500 MG PO CAPS
1000.0000 mg | ORAL_CAPSULE | Freq: Every day | ORAL | 0 refills | Status: AC
Start: 2022-06-25 — End: 2022-07-09

## 2022-06-25 MED ORDER — EMPAGLIFLOZIN 25 MG PO TABS
25.0000 mg | ORAL_TABLET | Freq: Every day | ORAL | 3 refills | Status: DC
Start: 2022-06-25 — End: 2023-08-13

## 2022-06-25 MED ORDER — METFORMIN HCL ER 500 MG PO TB24
1000.0000 mg | ORAL_TABLET | Freq: Two times a day (BID) | ORAL | 3 refills | Status: DC
Start: 2022-06-25 — End: 2022-09-27

## 2022-06-25 MED ORDER — OMEPRAZOLE 40 MG PO CPDR
40.0000 mg | DELAYED_RELEASE_CAPSULE | Freq: Every day | ORAL | 3 refills | Status: DC
Start: 2022-06-25 — End: 2022-09-07

## 2022-06-25 MED ORDER — CLARITHROMYCIN 500 MG PO TABS: 500.0000 mg | ORAL_TABLET | Freq: Two times a day (BID) | ORAL | 0 refills | Status: AC

## 2022-06-25 NOTE — Patient Instructions (Signed)
Good to see you today - Thank you for coming in  Things we discussed today:  1) For your H Pylori - Take Amoxicillin 2 pills once a day for 14 days - Take Clarithromycin 1 pill once a day for 14 days - Take Omeprazole 40mg  once a day for 14 days - Make an appointment at least 14 days after your last dose.  2) Your lymph node on the back of your neck is a Reactive Lymph Node.  - These can be present for 4-6 weeks. They do not need Xrays. - Come back to see me in 6 weeks to check the size.  Please always bring your medication bottles  Come back to see me in 6 weeks.

## 2022-06-25 NOTE — Progress Notes (Signed)
    SUBJECTIVE:   CHIEF COMPLAINT / HPI:   DM is a 57yo F p/f neck nodule  Neck Nodule Went to UC yesterday for headache. Was given a pain med and steroid shot, which helped. Was noted to have a nodule on back of neck, and provider there recommended that pt get an XR. She has not noticed this nodule before.   Need refill of h pylori meds  PERTINENT  PMH / PSH: T2DM, chronic OA  OBJECTIVE:   BP (!) 142/92   Pulse 96   Ht 5\' 4"  (1.626 m)   Wt (!) 317 lb (143.8 kg)   LMP 04/05/2016   SpO2 98%   BMI 54.41 kg/m   Gen: Alert, NAD. Resp: CTAB. Normal WOB on RA. CV: Skin warm and well perfused.  Derm: Tender, mobile nodule on back of neck, ~2cm on L posterior cervical chain.   ASSESSMENT/PLAN:   Chronic osteoarthritis XR consistent with osteoarthritis w/ joint space loss BL. Continue working w/ PT. Pt requesting additional pain medications for PT related pain. I am hesitant to add anything too sedating given pt already on chronic opioid for pain, and has fall risk. - Cont PT  Diabetic nephropathy associated with type 2 diabetes mellitus (HCC) A1c uptrended. Will uptitrate metforming and jardiance. Would benefit from GLP1 but insurance wont cover.  H. pylori infection Insurance won't cover Pylera. Will send in 14 day course of amoxicillin, clarythromycin, and omeprazole. Plan for test of cure 2 weeks after finishing treatment.   Reactive lymphadenopathy Single 2cm posterior cervical lymph node palpated on L posterior neck. Tender, mobile. No imaging indicated at this time. Recommend close monitoring and remeasure at follow-up in ~6wks.     Lincoln Brigham, MD [facility]

## 2022-06-26 ENCOUNTER — Ambulatory Visit: Payer: Medicaid Other | Admitting: Physical Therapy

## 2022-06-28 ENCOUNTER — Other Ambulatory Visit: Payer: Self-pay

## 2022-06-28 ENCOUNTER — Telehealth: Payer: Self-pay

## 2022-06-28 DIAGNOSIS — R599 Enlarged lymph nodes, unspecified: Secondary | ICD-10-CM | POA: Insufficient documentation

## 2022-06-28 DIAGNOSIS — R1033 Periumbilical pain: Secondary | ICD-10-CM

## 2022-06-28 NOTE — Assessment & Plan Note (Signed)
A1c uptrended. Will uptitrate metforming and jardiance. Would benefit from GLP1 but insurance wont cover.

## 2022-06-28 NOTE — Assessment & Plan Note (Signed)
Single 2cm posterior cervical lymph node palpated on L posterior neck. Tender, mobile. No imaging indicated at this time. Recommend close monitoring and remeasure at follow-up in ~6wks.

## 2022-06-28 NOTE — Assessment & Plan Note (Addendum)
XR consistent with osteoarthritis w/ joint space loss BL. Continue working w/ PT. Pt requesting additional pain medications for PT related pain. I am hesitant to add anything too sedating given pt already on chronic opioid for pain, and has fall risk. - Cont PT

## 2022-06-28 NOTE — Telephone Encounter (Signed)
Patient calls nurse line requesting lab results from 3/28.  She reports she is aware of positive H Pylori, however forgot to ask about the "others."   Will forward to PCP.

## 2022-06-28 NOTE — Assessment & Plan Note (Signed)
Civil engineer, contracting. Will send in 14 day course of amoxicillin, clarythromycin, and omeprazole. Plan for test of cure 2 weeks after finishing treatment.

## 2022-06-29 ENCOUNTER — Ambulatory Visit: Payer: Medicaid Other | Admitting: Physical Therapy

## 2022-06-29 MED ORDER — TRAMADOL HCL 50 MG PO TABS
50.0000 mg | ORAL_TABLET | Freq: Two times a day (BID) | ORAL | 0 refills | Status: DC | PRN
Start: 2022-06-29 — End: 2022-07-24

## 2022-06-29 NOTE — Telephone Encounter (Signed)
Returned pt call. Will plan to make an appointment 14 days after finishing H pylori treatment for test of cure. This will be the 3rd round of antibiotic treatment. - If this test is still positive, consider returning to GI for further management.

## 2022-06-29 NOTE — Telephone Encounter (Signed)
Patient returns call to nurse line regarding results. She forgot to ask about results from visit on 3/28 when she was here earlier this week.   Patient is requesting returned call at [phone].  Veronda Prude, RN

## 2022-06-30 ENCOUNTER — Emergency Department (HOSPITAL_COMMUNITY)
Admission: EM | Admit: 2022-06-30 | Discharge: 2022-07-01 | Disposition: A | Discharge status: Home / Self Care | Payer: Medicaid Other | Attending: Emergency Medicine | Admitting: Emergency Medicine

## 2022-06-30 ENCOUNTER — Encounter (HOSPITAL_COMMUNITY): Payer: Self-pay | Admitting: Emergency Medicine

## 2022-06-30 DIAGNOSIS — E871 Hypo-osmolality and hyponatremia: Secondary | ICD-10-CM | POA: Insufficient documentation

## 2022-06-30 DIAGNOSIS — R739 Hyperglycemia, unspecified: Secondary | ICD-10-CM | POA: Diagnosis not present

## 2022-06-30 DIAGNOSIS — L02211 Cutaneous abscess of abdominal wall: Secondary | ICD-10-CM | POA: Insufficient documentation

## 2022-06-30 DIAGNOSIS — L0291 Cutaneous abscess, unspecified: Secondary | ICD-10-CM

## 2022-06-30 NOTE — ED Triage Notes (Signed)
Abscess on panus on right hand side. Is draining. Hx of these.

## 2022-07-01 LAB — CBC WITH DIFFERENTIAL/PLATELET
Abs Immature Granulocytes: 0.05 10*3/uL (ref 0.00–0.07)
Basophils Absolute: 0 10*3/uL (ref 0.0–0.1)
Basophils Relative: 0 %
Eosinophils Absolute: 0.5 10*3/uL (ref 0.0–0.5)
Eosinophils Relative: 4 %
HCT: 39.7 % (ref 36.0–46.0)
Hemoglobin: 12.5 g/dL (ref 12.0–15.0)
Immature Granulocytes: 1 %
Lymphocytes Relative: 27 %
Lymphs Abs: 2.7 10*3/uL (ref 0.7–4.0)
MCH: 23.8 pg — ABNORMAL LOW (ref 26.0–34.0)
MCHC: 31.5 g/dL (ref 30.0–36.0)
MCV: 75.5 fL — ABNORMAL LOW (ref 80.0–100.0)
Monocytes Absolute: 0.7 10*3/uL (ref 0.1–1.0)
Monocytes Relative: 7 %
Neutro Abs: 6.3 10*3/uL (ref 1.7–7.7)
Neutrophils Relative %: 61 %
Platelets: 283 10*3/uL (ref 150–400)
RBC: 5.26 MIL/uL — ABNORMAL HIGH (ref 3.87–5.11)
RDW: 16.2 % — ABNORMAL HIGH (ref 11.5–15.5)
WBC: 10.3 10*3/uL (ref 4.0–10.5)
nRBC: 0 % (ref 0.0–0.2)

## 2022-07-01 LAB — BASIC METABOLIC PANEL
Anion gap: 9 (ref 5–15)
BUN: 11 mg/dL (ref 6–20)
CO2: 25 mmol/L (ref 22–32)
Calcium: 8.3 mg/dL — ABNORMAL LOW (ref 8.9–10.3)
Chloride: 99 mmol/L (ref 98–111)
Creatinine, Ser: 0.76 mg/dL (ref 0.44–1.00)
GFR, Estimated: >60 mL/min (ref >60)
Glucose, Bld: 332 mg/dL — ABNORMAL HIGH (ref 70–99)
Potassium: 3.7 mmol/L (ref 3.5–5.1)
Sodium: 133 mmol/L — ABNORMAL LOW (ref 135–145)

## 2022-07-01 MED ORDER — DOXYCYCLINE HYCLATE 100 MG PO TABS
100.0000 mg | ORAL_TABLET | Freq: Once | ORAL | Status: AC
  Administered 2022-07-01: 100 mg via ORAL
  Filled 2022-07-01: qty 1

## 2022-07-01 MED ORDER — SODIUM CHLORIDE 0.9 % IV BOLUS
1000.0000 mL | Freq: Once | INTRAVENOUS | Status: AC
  Administered 2022-07-01: 1000 mL via INTRAVENOUS

## 2022-07-01 MED ORDER — ACETAMINOPHEN 500 MG PO TABS
1000.0000 mg | ORAL_TABLET | ORAL | Status: AC
  Administered 2022-07-01: 1000 mg via ORAL
  Filled 2022-07-01: qty 2

## 2022-07-01 MED ORDER — LIDOCAINE-EPINEPHRINE (PF) 2 %-1:200000 IJ SOLN
10.0000 mL | Freq: Once | INTRAMUSCULAR | Status: AC
  Administered 2022-07-01: 10 mL
  Filled 2022-07-01: qty 20

## 2022-07-01 MED ORDER — DOXYCYCLINE HYCLATE 100 MG PO CAPS: 100.0000 mg | ORAL_CAPSULE | Freq: Two times a day (BID) | ORAL | 0 refills | Status: DC

## 2022-07-01 NOTE — ED Notes (Signed)
Clean nonadherent dressing applied to right lower ABD, Mepilex dressing applied over nonadherent dressing, patient tolerated dressing application wel

## 2022-07-01 NOTE — Discharge Instructions (Addendum)
Since he has had multiple abscesses in this 1 area I think it is reasonable to follow-up with a general surgeon I have given you the information for their office.  Take doxycycline for the entire course even if your symptoms improve.  You will need to have the wound rechecked in 72 hours with primary care doctor or the surgeon.  You can always return here to the ER for wound check if you are not able to be seen by one of them.  Please use Tylenol or ibuprofen for pain.  You may use 600 mg ibuprofen every 6 hours or 1000 mg of Tylenol every 6 hours.  You may choose to alternate between the 2.  This would be most effective.  Not to exceed 4 g of Tylenol within 24 hours.  Not to exceed 3200 mg ibuprofen 24 hours.   Please take all of your antibiotics until finished!   You may develop abdominal discomfort or diarrhea from the antibiotic.  You may help offset this with probiotics which you can buy or get in yogurt. Do not eat  or take the probiotics until 2 hours after your antibiotic.

## 2022-07-01 NOTE — ED Provider Notes (Addendum)
[facility] AT [facility] Provider Note   CSN: [id number] Arrival date & time: 06/30/22  2319     History  Chief Complaint  Patient presents with   Abscess    Rita Bass is a 57 y.o. female.   Abscess Patient is a 57 year old female with past medical history significant for morbid obesity, sleep apnea, reflux, H. 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  She states that she has not had any fevers no nausea vomiting or fatigue.  No other associate symptoms.      Home Medications Prior to Admission medications   Medication Sig Start Date End Date Taking? Authorizing Provider  doxycycline (VIBRAMYCIN) 100 MG capsule Take 1 capsule (100 mg total) by mouth 2 (two) times daily. 07/01/22  Yes Otoniel Myhand, Stevphen Meuse S, PA  ACCU-CHEK GUIDE test strip TEST ONCE DAILY Patient taking differently: Last glucose: 137 yesterday (06-23-2022) 04/10/21   Jackelyn Poling, DO  Accu-Chek Softclix Lancets lancets Use as instructed 11/25/20   Moses Manners, MD  acetaminophen (TYLENOL) 650 MG CR tablet Take 650-1,300 mg by mouth every 8 (eight) hours as needed for pain. Last dose (2) around 0930 am.    [provider]  albuterol (PROVENTIL) (2.5 MG/3ML) 0.083% nebulizer solution Take 2.5 mg by nebulization every 6 (six) hours as needed. 01/26/22   [provider]  albuterol (PROVENTIL) (5 MG/ML) 0.5% nebulizer solution Take 0.5 mLs (2.5 mg total) by nebulization every 6 (six) hours as needed for wheezing or shortness of breath. 01/26/22   Piontek, Denny Peon, MD  AMBULATORY NON FORMULARY MEDICATION Medication Name: Diltiazem 2%/Lidocaine 5%- Using your index finger, apply a small amount of medication inside the rectum up to your first knuckle/joint three times daily x 8  weeks. Patient not taking: Reported on 01/15/2022 09/22/21   Doree Albee, PA-C  amoxicillin (AMOXIL) 500 MG capsule Take 2 capsules (1,000 mg total) by mouth daily for 14 days. 06/25/22 07/09/22  Lincoln Brigham, MD  benzonatate (TESSALON PERLES) 100 MG capsule Take 2 capsules (200 mg total) by mouth 3 (three) times daily as needed for cough. 02/01/22   Lincoln Brigham, MD  BINAXNOW COVID-19 AG HOME TEST KIT TEST AS DIRECTED TODAY 05/14/22   [provider]  cetirizine (ZYRTEC) 10 MG tablet Take 1 tablet (10 mg total) by mouth daily. Patient not taking: Reported on 10/05/2021 07/28/21   Sabino Dick, DO  clarithromycin (BIAXIN) 500 MG tablet Take 1 tablet (500 mg total) by mouth 2 (two) times daily for 14 days. 06/25/22 07/09/22  Lincoln Brigham, MD  DULoxetine (CYMBALTA) 20 MG capsule Take 1 capsule (20 mg total) by mouth daily. 06/14/22   Lincoln Brigham, MD  empagliflozin (JARDIANCE) 25 MG TABS tablet Take 1 tablet (25 mg total) by mouth daily. 06/25/22   Lincoln Brigham, MD  fluticasone (FLONASE) 50 MCG/ACT nasal spray Place 2 sprays into both nostrils daily. Patient not taking: Reported on 01/15/2022 07/25/21   Jackelyn Poling, DO  gabapentin (NEURONTIN) 100 MG capsule Take 4 capsules (400 mg total) by mouth 3 (three) times daily. 06/14/22 09/12/22  Lincoln Brigham, MD  guaiFENesin (MUCINEX) 600 MG 12 hr tablet Take 1 tablet (600 mg total) by mouth 2 (two) times daily as needed for cough or to loosen phlegm. 02/01/22   Lincoln Brigham, MD  hydrocortisone (ANUSOL-HC) 2.5 % rectal cream Place 1 Application rectally 2 (two) times daily. Patient not taking: Reported on 01/15/2022 10/30/21   Lincoln Brigham, MD  ibuprofen (ADVIL) 800 MG tablet Take 1 tablet (800 mg total) by mouth 3 (three) times daily. For headache. 06/24/22   Ellsworth Lennox, PA-C  Insulin Pen Needle (PEN NEEDLES) 32G X 5 MM MISC Use as directed with victoza 11/25/20   Moses Manners, MD  lisinopril (ZESTRIL) 10 MG tablet TAKE 1 TABLET(10 MG) BY MOUTH  DAILY 05/29/22   Lincoln Brigham, MD  metFORMIN (GLUCOPHAGE-XR) 500 MG 24 hr tablet Take 2 tablets (1,000 mg total) by mouth 2 (two) times daily. 06/25/22   Lincoln Brigham, MD  NON FORMULARY Diltiazem 2%/Lidocaine5% compound Use 3 x rectally daily for 2 months to heal anal fissure Patient not taking: Reported on 01/15/2022 09/22/21   Doree Albee, PA-C  nystatin (MYCOSTATIN/NYSTOP) powder Apply 1 Application topically 2 (two) times daily. 05/29/22   Lincoln Brigham, MD  olopatadine (PATANOL) 0.1 % ophthalmic solution Place 1 drop into both eyes 2 (two) times daily. Patient not taking: Reported on 09/22/2021 07/28/21   Sabino Dick, DO  omeprazole (PRILOSEC) 40 MG capsule Take 1 capsule (40 mg total) by mouth daily. 06/25/22   Lincoln Brigham, MD  ondansetron (ZOFRAN) 4 MG tablet Take 1 tablet (4 mg total) by mouth every 8 (eight) hours as needed for nausea or vomiting. Patient not taking: Reported on 01/15/2022 06/16/21   Jackelyn Poling, DO  traMADol (ULTRAM) 50 MG tablet Take 1 tablet (50 mg total) by mouth every 12 (twelve) hours as needed. Not to be refilled before 30 days 06/29/22   Lincoln Brigham, MD      Allergies    Patient has no known allergies.    Review of Systems   Review of Systems  Physical Exam Updated Vital Signs BP 128/83 (BP Location: Right Arm)   Pulse 94   Temp 98.8 F (37.1 C) (Oral)   Resp 20   LMP 04/05/2016   SpO2 94%  Physical Exam Vitals and nursing note reviewed.  Constitutional:      General: She is not in acute distress.    Appearance: She is obese.     Comments: Pleasant well-appearing 58 year old.  In no acute distress.  Sitting comfortably in bed.  Able answer questions appropriately follow commands. No increased work of breathing. Speaking in full sentences.   HENT:     Head: Normocephalic and atraumatic.     Nose: Nose normal.  Eyes:     General: No scleral icterus. Cardiovascular:     Rate and Rhythm: Normal rate and regular rhythm.     Pulses: Normal  pulses.     Heart sounds: Normal heart sounds.  Pulmonary:     Effort: Pulmonary effort is normal. No respiratory distress.     Breath sounds: No wheezing.  Abdominal:     Palpations: Abdomen is soft.     Tenderness: There is abdominal tenderness. There is no guarding or rebound.     Comments: Superficial tenderness only over the skin of the pannus that includes the abscess  Musculoskeletal:     Cervical back: Normal range of motion.     Right lower leg: No edema.     Left lower leg: No edema.     Comments: Large cutaneous abscess to right lower side of pannus fluctuant, scant purulent drainage, surrounding erythema present  Skin:    General: Skin is warm and dry.  Capillary Refill: Capillary refill takes less than 2 seconds.  Neurological:     Mental Status: She is alert. Mental status is at baseline.  Psychiatric:        Mood and Affect: Mood normal.        Behavior: Behavior normal.     ED Results / Procedures / Treatments   Labs (all labs ordered are listed, but only abnormal results are displayed) Labs Reviewed  BASIC METABOLIC PANEL - Abnormal; Notable for the following components:      Result Value   Sodium 133 (*)    Glucose, Bld 332 (*)    Calcium 8.3 (*)    All other components within normal limits  CBC WITH DIFFERENTIAL/PLATELET - Abnormal; Notable for the following components:   RBC 5.26 (*)    MCV 75.5 (*)    MCH 23.8 (*)    RDW 16.2 (*)    All other components within normal limits    EKG None  Radiology No results found.  Procedures .Marland KitchenIncision and Drainage  Date/Time: 07/01/2022 7:31 AM  Performed by: Gailen Shelter, PA Authorized by: Gailen Shelter, PA   Consent:    Consent obtained:  Verbal   Consent given by:  Patient   Risks discussed:  Bleeding, incomplete drainage, pain and damage to other organs   Alternatives discussed:  No treatment Universal protocol:    Procedure explained and questions answered to patient or proxy's  satisfaction: yes     Relevant documents present and verified: yes     Test results available : yes     Imaging studies available: yes     Required blood products, implants, devices, and special equipment available: yes     Site/side marked: yes     Immediately prior to procedure, a time out was called: yes     Patient identity confirmed:  Verbally with patient Location:    Type:  Abscess   Size:  4 cm   Location: R side of pannus. Pre-procedure details:    Skin preparation:  Betadine Anesthesia:    Anesthesia method:  Local infiltration   Local anesthetic:  Lidocaine 2% WITH epi Procedure type:    Complexity:  Complex Procedure details:    Incision types:  Single straight   Incision depth:  Subcutaneous   Wound management:  Probed and deloculated, irrigated with saline and extensive cleaning   Drainage:  Purulent and bloody   Drainage amount:  Moderate   Packing materials:  1/4 in gauze Post-procedure details:    Procedure completion:  Tolerated well, no immediate complications     Medications Ordered in ED Medications  acetaminophen (TYLENOL) tablet 1,000 mg (1,000 mg Oral Given 07/01/22 0059)  doxycycline (VIBRA-TABS) tablet 100 mg (100 mg Oral Given 07/01/22 0059)  lidocaine-EPINEPHrine (XYLOCAINE W/EPI) 2 %-1:200000 (PF) injection 10 mL (10 mLs Infiltration Given 07/01/22 0257)  sodium chloride 0.9 % bolus 1,000 mL (0 mLs Intravenous Stopped 07/01/22 0257)    ED Course/ Medical Decision Making/ A&P                             Medical Decision Making Amount and/or Complexity of Data Reviewed Labs: ordered.  Risk OTC drugs. Prescription drug management.   Patient is a 57 year old female with past medical history significant for morbid obesity, sleep apnea, reflux, H. pylori currently being treated with clarithromycin and amoxicillin and PPI  She is present emergency room today with 3 days  of redness and swelling to her right lower abdomen she has had multiple  pannus abscesses in the past.  She states that she has not had any fevers no nausea vomiting or fatigue.  No other associate symptoms.  Patient with large cutaneous abscess to right lower pannus there is surrounding erythema.  Lidocaine infiltration was used incision and drainage was completed large amount of sanguinous fluid was extricated packing was placed wound check will need to be in 72 hours  Labs show no leukocytosis and no anemia, WBC upper ledge of normal however.  Will start on doxycycline to cover MRSA.  She is currently on amoxicillin for triple therapy for H. pylori.  This will likely cover strep relatively well.  BMP with mild hyponatremia that corrects to normal with given hyperglycemia.  No anion gap.  Will discharge home at this time return precautions discussed.  Final Clinical Impression(s) / ED Diagnoses Final diagnoses:  Abscess    Rx / DC Orders ED Discharge Orders          Ordered    doxycycline (VIBRAMYCIN) 100 MG capsule  2 times daily        07/01/22 0318              Gailen Shelter, PA 07/01/22 0731    Gailen Shelter, PA 07/01/22 0732    Nira Conn, MD 07/01/22 [phone]

## 2022-07-03 ENCOUNTER — Other Ambulatory Visit (HOSPITAL_COMMUNITY): Payer: Self-pay

## 2022-07-03 ENCOUNTER — Ambulatory Visit (INDEPENDENT_AMBULATORY_CARE_PROVIDER_SITE_OTHER): Payer: Medicaid Other | Admitting: Family Medicine

## 2022-07-03 ENCOUNTER — Ambulatory Visit: Payer: Medicaid Other | Admitting: Physical Therapy

## 2022-07-03 VITALS — BP 155/86 | HR 100 | Ht 64.0 in | Wt 316.5 lb

## 2022-07-03 DIAGNOSIS — R0609 Other forms of dyspnea: Secondary | ICD-10-CM | POA: Diagnosis not present

## 2022-07-03 DIAGNOSIS — L0291 Cutaneous abscess, unspecified: Secondary | ICD-10-CM

## 2022-07-03 DIAGNOSIS — E118 Type 2 diabetes mellitus with unspecified complications: Secondary | ICD-10-CM | POA: Diagnosis not present

## 2022-07-03 MED ORDER — ACCU-CHEK SOFTCLIX LANCETS MISC
12 refills | Status: DC
Start: 2022-07-03 — End: 2023-01-16

## 2022-07-03 MED ORDER — ACCU-CHEK GUIDE VI STRP: 1.0000 | ORAL_STRIP | Freq: Every day | 1 refills | Status: DC

## 2022-07-03 MED ORDER — ACCU-CHEK GUIDE W/DEVICE KIT: 1.0000 | PACK | Freq: Once | 0 refills | Status: AC

## 2022-07-03 MED ORDER — OZEMPIC (2 MG/DOSE) 8 MG/3ML ~~LOC~~ SOPN
2.0000 mg | PEN_INJECTOR | SUBCUTANEOUS | 11 refills | Status: DC
Start: 2022-07-03 — End: 2022-09-13

## 2022-07-03 NOTE — Assessment & Plan Note (Signed)
A1c 8.8 checked at last . Currently on Metformin and Jardiance. Met with Dr. Raymondo Band and pharmacy team. Will re-start Ozempic .  weekly as insurance will cover it which was confirmed today. Will check on how patient is tolerating medication at follow up. A1c check in 3 months.

## 2022-07-03 NOTE — Patient Instructions (Signed)
It was great seeing you today!  We unpacked your wound and Im glad it is healing well. Continue  your doxycycline until finished.   Today you were also able to talk briefly with Dr. Raymondo Band and the pharmacy team.   I have scheduled follow up visit next Monday with your PCP to discuss diabetes and your blood pressure.   Feel free to call with any questions or concerns at any time, at [phone].   Take care,  Dr. Cora Collum [facility]

## 2022-07-03 NOTE — Progress Notes (Addendum)
Asked to join visit with Dr. Idalia Needle related to blood sugar control.   Patient reported blood sugars routinely > 300 since she has stopped her Ozempic (semaglutide)  early this calendar year.  She reports that her weight has increase almost 30 pounds since stopping Ozempic.   Patient also request new glucometer, strips and lancets.   Discussed test claim with Siri Cole, CPhT and we determined that Ozempic should be covered at $4 per month supply.    New prescriptions for Ozempic, meter, strips and lancets sent to pharmacy.  Plan to see with PCP, Dr. Sherrilee Gilles on 4/22 to assess glucose control.    Contacted pharmacy by phone - Ozempic (semaglutide) was approved for $4.  I asked for that to be prepared for pick-up tomorrow.

## 2022-07-03 NOTE — Progress Notes (Signed)
    SUBJECTIVE:   CHIEF COMPLAINT / HPI:   Patient presents for follow up from recent ED visit on 4/14. Brief ED course below  Patient presented with 3 days of redness and swelling to her right lower abdomen she has had multiple pannus abscesses in the past. Had large cutaneous abscess to right lower pannus which was incised, drained, and packed. Was advised to return in 72 hours for wound check  Today patient endorses doing well and denies any fever or chills. Does state wound has still been draining yellow fluid. States around the wound has still been a little tender.  Currently being treated for H. Pylori but also taking doxycycline for the abscess   States she is supposed to meet with Dr. Raymondo Band today as well to discuss diabetes   PERTINENT  PMH / PSH: Reviewed   OBJECTIVE:   BP (!) 155/86   Pulse 100   Ht  (1.626 m)   Wt (!) 316 lb 8 oz (143.6 kg)   LMP 04/05/2016   SpO2 99%   BMI 54.33 kg/m    Physical exam General: well appearing, NAD Cardiovascular: Mildly tachycardic, no murmurs Lungs: CTAB. Normal WOB Abdomen: soft, obese  Skin: warm, dry. Incision with minimal surrounding erythema. Packing removed from inside wound. No visible drainage      ASSESSMENT/PLAN:   Type 2 diabetes mellitus with complication (HCC) A1c 8.8 checked at last . Currently on Metformin and Jardiance. Met with Dr. Raymondo Band and pharmacy team. Will re-start Ozempic .  weekly as insurance will cover it which was confirmed today. Will check on how patient is tolerating medication at follow up. A1c check in 3 months.   Abscess Patient presents for follow up from abscess that was drained and packed 2 days ago in the ED. Today she is well appearing, afebrile. Incision with minimal surrounding erythema and without drainage. Packing removed from inside wound. Patient has follow up scheduled next week and can check on wound again at that time. Strict return precautions discussed.     Patient to  return next week for PCP visit to discuss HTN and follow up on wound  Cora Collum, DO [facility]

## 2022-07-03 NOTE — Assessment & Plan Note (Signed)
Patient presents for follow up from abscess that was drained and packed 2 days ago in the ED. Today she is well appearing, afebrile. Incision with minimal surrounding erythema and without drainage. Packing removed from inside wound. Patient has follow up scheduled next week and can check on wound again at that time. Strict return precautions discussed.

## 2022-07-06 ENCOUNTER — Encounter: Payer: Medicaid Other | Admitting: Physical Therapy

## 2022-07-09 ENCOUNTER — Ambulatory Visit (INDEPENDENT_AMBULATORY_CARE_PROVIDER_SITE_OTHER): Payer: Medicaid Other | Admitting: Family Medicine

## 2022-07-09 ENCOUNTER — Ambulatory Visit: Payer: Medicaid Other | Admitting: Physical Therapy

## 2022-07-09 ENCOUNTER — Encounter: Payer: Self-pay | Admitting: Family Medicine

## 2022-07-09 VITALS — BP 147/70 | HR 85 | Ht 64.0 in | Wt 320.0 lb

## 2022-07-09 DIAGNOSIS — E118 Type 2 diabetes mellitus with unspecified complications: Secondary | ICD-10-CM

## 2022-07-09 DIAGNOSIS — L0291 Cutaneous abscess, unspecified: Secondary | ICD-10-CM | POA: Diagnosis not present

## 2022-07-09 DIAGNOSIS — R599 Enlarged lymph nodes, unspecified: Secondary | ICD-10-CM

## 2022-07-09 DIAGNOSIS — E871 Hypo-osmolality and hyponatremia: Secondary | ICD-10-CM | POA: Diagnosis not present

## 2022-07-09 NOTE — Patient Instructions (Signed)
Good to see you today - Thank you for coming in  Things we discussed today:  1) For your abscess on your stomach, it is improving from the last time you were here. - Continue changing the bandage daily - You can apply plain Vaseline to the wound before putting on the bandage  2) For your diabetes - Check your sugar in the morning, before you eat anything. Bring in the recorded number to your next visit.  3) Your lymph node on the back of your neck is getting smaller. This is good, it should continue to improve.  Please always bring your medication bottles  Come back to see me in 1 week

## 2022-07-09 NOTE — Progress Notes (Unsigned)
    SUBJECTIVE:   CHIEF COMPLAINT / HPI:   DM is a 57 yo F h/f followup of abscess.   Abscess - f/u from prior visit w/ Dr. Idalia Needle  - Abscess is stilld raining yellow fluid daily. She changes bandage and clean it daily. No fevers or increase in pain. Tender when touched.   T2DM Pt has successfully restarted Ozempic.  Lymph Node - f/u from prior with me - size has gone down since last visit.  Lymph node Back down to 1cm  PERTINENT  PMH / PSH: T2DM, chronic OA   OBJECTIVE:   BP (!) 147/70   Pulse 85   Ht  (1.626 m)   Wt (!) 320 lb (145.2 kg)   LMP 04/05/2016   SpO2 97%   BMI 54.93 kg/m   Gen: Alert, sitting in chair. NAD.  HEENT: 1cm mobile lymph node in posterior L cervical neck.  Resp: Normal WOB on RA CV: Skin warm and well perfused Derm: ~1.5cm incision on R lower abm, appears dry and clean. Has underlying induration and tender to palpation directly over incision. Serous fluid on bandage, does not appear purulent.      ASSESSMENT/PLAN:   Type 2 diabetes mellitus with complication (HCC) - Restarted Ozempic and tolerating well. - Cont metformin and jardiance  Reactive lymphadenopathy Decreased from prior exam, still mobile and nontender. Discussed that this will likely decrease over time, but to notify if it grows again.  Abscess - Appears clean and no signs of infection. - Continue daily wound dressing changes and advised to apply vaseline. - f/u in 1 week to monitor   Lincoln Brigham, MD [facility]

## 2022-07-10 LAB — BASIC METABOLIC PANEL
BUN/Creatinine Ratio: 20 (ref 9–23)
BUN: 12 mg/dL (ref 6–24)
CO2: 24 mmol/L (ref 20–29)
Calcium: 9.5 mg/dL (ref 8.7–10.2)
Chloride: 96 mmol/L (ref 96–106)
Creatinine, Ser: 0.59 mg/dL (ref 0.57–1.00)
Glucose: 295 mg/dL — ABNORMAL HIGH (ref 70–99)
Potassium: 4.1 mmol/L (ref 3.5–5.2)
Sodium: 138 mmol/L (ref 134–144)
eGFR: 107 mL/min/{1.73_m2} (ref >59)

## 2022-07-11 NOTE — Assessment & Plan Note (Signed)
Decreased from prior exam, still mobile and nontender. Discussed that this will likely decrease over time, but to notify if it grows again.

## 2022-07-11 NOTE — Assessment & Plan Note (Signed)
-   Restarted Ozempic and tolerating well. - Cont metformin and jardiance

## 2022-07-11 NOTE — Assessment & Plan Note (Signed)
-   Appears clean and no signs of infection. - Continue daily wound dressing changes and advised to apply vaseline. - f/u in 1 week to monitor

## 2022-07-13 ENCOUNTER — Encounter: Payer: Self-pay | Admitting: Physical Therapy

## 2022-07-13 ENCOUNTER — Ambulatory Visit: Payer: Medicaid Other | Admitting: Physical Therapy

## 2022-07-13 DIAGNOSIS — R2689 Other abnormalities of gait and mobility: Secondary | ICD-10-CM

## 2022-07-13 DIAGNOSIS — M25561 Pain in right knee: Secondary | ICD-10-CM | POA: Diagnosis not present

## 2022-07-13 DIAGNOSIS — G8929 Other chronic pain: Secondary | ICD-10-CM

## 2022-07-13 DIAGNOSIS — M6281 Muscle weakness (generalized): Secondary | ICD-10-CM | POA: Diagnosis not present

## 2022-07-13 DIAGNOSIS — M25551 Pain in right hip: Secondary | ICD-10-CM

## 2022-07-13 DIAGNOSIS — M25562 Pain in left knee: Secondary | ICD-10-CM | POA: Diagnosis not present

## 2022-07-13 NOTE — Therapy (Signed)
OUTPATIENT PHYSICAL THERAPY LOWER EXTREMITY TREATMENT   Patient Name: Rita Bass MRN: [id number] DOB:[birth year], 56 y.o., female Today's Date: 07/13/2022  END OF SESSION:  PT End of Session - 07/13/22 1134     Visit Number 3    Number of Visits 7    Date for PT Re-Evaluation 07/20/22    Authorization Type [employer]    Authorization Time Period Approved 6 visits 05/28/22-07/26/22    PT Start Time 1133    PT Stop Time 1213    PT Time Calculation (min) 40 min    Activity Tolerance Patient tolerated treatment well    Behavior During Therapy WFL for tasks assessed/performed              Past Medical History:  Diagnosis Date   Arthritis    Asthma    Chronic pain of left knee    Complication of anesthesia    PONV   Diabetes (HCC)    GERD (gastroesophageal reflux disease)    H/O bronchitis    Hepatic steatosis    Internal hemorrhoids    Kidney stones    Obesity    Renal disorder    Shortness of breath dyspnea    Sleep apnea    past issues - at weight over 400lbs   Sleep apnea in adult    Polysomnogram pending.  Followed by Dr. Craige Cotta   Past Surgical History:  Procedure Laterality Date   BIOPSY  11/01/2020   Procedure: BIOPSY;  Surgeon: Shellia Cleverly, DO;  Location: [facility];  Service: Gastroenterology;;   BIOPSY  05/11/2021   Procedure: BIOPSY;  Surgeon: Shellia Cleverly, DO;  Location: [facility];  Service: Gastroenterology;;   CESAREAN SECTION     6 c-sections   CHOLECYSTECTOMY     COLONOSCOPY WITH PROPOFOL N/A 05/11/2021   Procedure: COLONOSCOPY WITH PROPOFOL;  Surgeon: Shellia Cleverly, DO;  Location: [facility];  Service: Gastroenterology;  Laterality: N/A;   ESOPHAGOGASTRODUODENOSCOPY (EGD) WITH PROPOFOL N/A 11/01/2020   Procedure: ESOPHAGOGASTRODUODENOSCOPY (EGD) WITH PROPOFOL;  Surgeon: Shellia Cleverly, DO;  Location: [facility];  Service: Gastroenterology;  Laterality: N/A;   HERNIA REPAIR     KIDNEY STONE  SURGERY     OOPHORECTOMY     TRANSTHORACIC ECHOCARDIOGRAM  09/2017   Technically difficult study.  Did not use Definity contrast.  EF was 55 to 60% with moderate LVH and grade 1 diastolic dysfunction.  No significant valvular lesions noted.  No regional wall motion normality but difficult to assess due to poor imaging.    TUBAL LIGATION     UPPER GASTROINTESTINAL ENDOSCOPY     Patient Active Problem List   Diagnosis Date Noted   Type 2 diabetes mellitus with diabetic neuropathy, unspecified (HCC) 04/30/2022   Chronic osteoarthritis 04/30/2022   Viral URI with cough 01/24/2022   Rash 01/15/2022   External hemorrhoid 09/15/2021   Generalized abdominal pain 08/30/2021   Ventral hernia 08/16/2021   Allergic conjunctivitis 07/28/2021   Grade II internal hemorrhoids    H. pylori infection 11/08/2020   Gastritis and gastroduodenitis    Lung nodule 10/26/2020   Ventral hernia without obstruction or gangrene 10/04/2020   Chronic diarrhea 09/16/2020   Abnormal Pap smear of cervix 08/18/2020   Hyperlipidemia associated with type 2 diabetes mellitus (HCC) 06/21/2020   Right hip pain 06/21/2020   Orthopnea 01/04/2020   Chronic cough 12/23/2019   Dyspepsia 05/22/2019   Essential hypertension 04/07/2019   Vaginal bleeding 01/27/2019   Low back  pain 01/27/2019   BMI 50.0-59.9, adult (HCC) 12/05/2017   Diastolic dysfunction with heart failure (HCC) 10/25/2017   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   Abscess 10/25/2017   Diabetic nephropathy associated with type 2 diabetes mellitus (HCC) 10/25/2017   Type 2 diabetes mellitus with complication (HCC) 09/13/2017   Sleep apnea 08/16/2017   Chronic pain of right knee 07/13/2016   Morbid obesity (HCC) 02/04/2015   Menorrhagia with regular cycle 11/18/2014    PCP: Lincoln Brigham, MD  REFERRING PROVIDER: Doreene Eland, MD  REFERRING DIAG: M17.0 (ICD-10-CM) - Osteoarthritis of both knees, unspecified osteoarthritis type  THERAPY DIAG:  Chronic pain  of right knee  Pain in right hip  Chronic pain of left knee  Other abnormalities of gait and mobility  Rationale for Evaluation and Treatment: Rehabilitation  ONSET DATE: >1 year  SUBJECTIVE:   SUBJECTIVE STATEMENT: Pt reports that she had an abscess in her abdomen and was unable to attend because she had to let this drain.  She feels the exercises were helpful, but when she was not able to do them the pain returned.  PERTINENT HISTORY: Bowel/colon blockage, hernia, stomach surgeries From eval: Pt reports problems with her knee. Pt was diagnosed with severe arthritis in a while. Pt states she was coming here for PT but then had stomach issues and had to hold (pt had received surgery). Pt reports she is not very mobile and the pain shoots up to her hip. Pt states she has occasions when she falls. Pt reports R knee is worse than L. Notes difficulty with stairs and stepping/out of bath tub. Reports she sometimes feels that a "bone is stuck."  PAIN:  Are you having pain? Yes: NPRS scale: 7 currently, 20 at worst /10 Pain location: Front of R knee Pain description: Sharp dull shooting burning pain Aggravating factors: Hurts to stretch out leg, walking, stairs Relieving factors: Lots of creams, heating pads  PRECAUTIONS: Fall  WEIGHT BEARING RESTRICTIONS: No  FALLS:  Has patient fallen in last 6 months? Yes. 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 R patella more laterally oriented than L  PALPATION: TTP R quad and hamstring, hypomobile patellas  LOWER EXTREMITY ROM:  Active ROM Right eval Left eval  Hip flexion    Hip extension    Hip abduction    Hip adduction    Hip internal rotation    Hip external rotation    Knee flexion 90 115  Knee extension -40 LAQ -10 LAQ  Ankle dorsiflexion    Ankle plantarflexion    Ankle inversion    Ankle eversion     (Blank rows = not tested)  LOWER EXTREMITY MMT:  MMT Right eval Left eval  Hip flexion 3 4  Hip extension 3- 4  Hip  abduction 3- 4  Hip adduction    Hip internal rotation    Hip external rotation    Knee flexion 3+ 4  Knee extension 3+ 4  Ankle dorsiflexion    Ankle plantarflexion    Ankle inversion    Ankle eversion     (Blank rows = not tested)  LOWER EXTREMITY SPECIAL TESTS:  Hip special tests: Luisa Hart (FABER) test: positive  Unable to bend knee enough to test appropriately  FUNCTIONAL TESTS:  5 times sit to stand: 18.17 sec TUG: 21.02 sec  GAIT: Distance walked: 100' Assistive device utilized: None Level of assistance: SBA Comments: R LE externally rotated, increased pronation, wide BOS, lateral trunk lean (R more than L)   TODAY'S TREATMENT:   07/13/22 Therapeutic Exercise: - Nustep L6 x 5 min UEs/LEs - Sidelying hip abduction 2x10 small arc - Sidelying TFL stretch x 1 min (NT) - Bridge 3x10 - SAQ 2x10 - 5#  - SLR with foot propped on bolster 2x10 small arc - Hamstring curl with foot on swiss ball 2x10 - Sit<>stand 2x10 with focus on decreasing knee valgus       06/20/22 Therapeutic Exercise: - Nustep L5 x 5 min UEs/LEs - Sidelying hip abduction 2x10 on L, clamshell on R - Sidelying TFL stretch x 1 min -  Bridge x10 - SAQ 2x10 - SLR with foot propped on bolster 2x10 - Hamstring curl with foot on foam roll 2x10 - Sitting heel/toe raise 2x10 - Sit<>stand x8 with focus on decreasing knee valgus                                                                                                                          DATE: 05/25/22  Trialed quad sets but too painful for pt See HEP below   PATIENT EDUCATION:  Education details: Exam findings, POC, initial HEP Person educated: Patient Education method: Explanation, Demonstration, and Handouts Education comprehension: verbalized understanding, returned demonstration, and needs further education  HOME EXERCISE PROGRAM: Access Code: [email or web address] URL: [email or web address] Date: 06/20/2022 Prepared by: Vernon Prey April Kirstie Peri  Exercises - Sidelying Hip Abduction  - 1 x daily - 7 x weekly - 2 sets - 10 reps - Clamshell  - 1 x daily - 7 x weekly - 2 sets - 10 reps - Beginner Bridge  - 1 x daily - 7 x weekly - 2 sets - 10 reps - Supine Short Arc Quad  - 1 x daily - 7 x weekly - 2 sets - 10 reps - 3 sec hold - Supine Straight Leg Raise  - 1 x daily - 7 x weekly - 2 sets - 10 reps  ASSESSMENT:  CLINICAL IMPRESSION: Michaelina tolerated session well with no adverse reaction.  She is able to complete prescribed exercises with slight increase in R knee pain with min cuing.  I suggested she look into aquatic therapy therapy or a pool to exercise in; she states she will think about this.  OBJECTIVE IMPAIRMENTS:  Abnormal gait, decreased activity tolerance, decreased balance, decreased endurance, decreased mobility, difficulty walking, decreased ROM, decreased strength, increased edema, increased fascial restrictions, increased muscle spasms, impaired flexibility, improper body mechanics, postural dysfunction, obesity, and pain.   ACTIVITY LIMITATIONS: lifting, bending, sitting, standing, squatting, stairs, transfers, bed mobility, bathing,  toileting, hygiene/grooming, and locomotion level  PARTICIPATION LIMITATIONS: meal prep, cleaning, laundry, shopping, community activity, and yard work  PERSONAL FACTORS: Fitness, Past/current experiences, and Time since onset of injury/illness/exacerbation are also affecting patient's functional outcome.   REHAB POTENTIAL: Good  CLINICAL DECISION MAKING: Evolving/moderate complexity  EVALUATION COMPLEXITY: Moderate   GOALS: Goals reviewed with patient? Yes  SHORT TERM GOALS: Target date: 06/22/2022  Pt will be ind with initial HEP Baseline: Goal status: INITIAL  2.  Pt will be able to improve knee ROM by >/=10 deg Baseline:  Goal status: INITIAL   LONG TERM GOALS: Target date: 07/20/2022   Pt will be ind with management and progression of HEP Baseline:  Goal status: INITIAL  2.  Pt will be able to safely perform 3 steps with reciprocal pattern for home entry/exit Baseline:  Goal status: INITIAL  3.  Pt will have improved 5x STS to </=13 sec to demo increased functional LE strength Baseline:  Goal status: INITIAL  4.  Pt will have improved TUG to </=13 sec to demo decreased fall risk Baseline:  Goal status: INITIAL  5.  Pt will have improved LEFS score to 22% to demo decreased disability Baseline:  Goal status: INITIAL     PLAN:  PT FREQUENCY: 2x/week  PT DURATION: 8 weeks  PLANNED INTERVENTIONS: Therapeutic exercises, Therapeutic activity, Neuromuscular re-education, Balance training, Gait training, Patient/Family education, Self Care, Joint mobilization, Stair training, DME instructions, Aquatic Therapy, Cryotherapy, Moist heat, Taping, and Manual therapy  PLAN FOR NEXT SESSION: Assess response to HEP. Continue to strengthen knee/hip as pt tolerates. Manual/stretching/ROM for knee.    Fredderick Phenix, PT 07/13/2022, 12:21 PM

## 2022-07-16 ENCOUNTER — Ambulatory Visit: Payer: Medicaid Other | Admitting: Physical Therapy

## 2022-07-19 ENCOUNTER — Ambulatory Visit: Payer: Medicaid Other | Admitting: Family Medicine

## 2022-07-20 ENCOUNTER — Ambulatory Visit: Payer: Medicaid Other | Admitting: Physical Therapy

## 2022-07-20 ENCOUNTER — Ambulatory Visit: Payer: Medicaid Other | Admitting: Family Medicine

## 2022-07-20 VITALS — Temp 98.1°F | Ht 64.0 in | Wt 316.5 lb

## 2022-07-20 DIAGNOSIS — Z5189 Encounter for other specified aftercare: Secondary | ICD-10-CM | POA: Diagnosis not present

## 2022-07-20 NOTE — Patient Instructions (Addendum)
It was great seeing you today!  Today we discussed your wound which looks great! I am glad that you completed your antibiotics. Please keep covered and prevent infection and continue to use vaseline.   Please follow up at your next scheduled appointment on 5/10 at 9:50 am, if anything arises between now and then, please don't hesitate to contact our office.   Thank you for allowing [name] to be a part of your medical care!  Thank you, Dr. Robyne Peers

## 2022-07-20 NOTE — Progress Notes (Unsigned)
    SUBJECTIVE:   CHIEF COMPLAINT / HPI:   Patient presents for wound check. Doing well since last visit with Dr. Sherrilee Gilles. S/p I&D about 2 weeks ago. Denies fever, chills, drainage or bleeding. She changes her bandages daily. Has completed her prescribed antibiotic course of doxycycline a few days ago. Denies any other concerns at this time.   OBJECTIVE:   Temp 98.1 F (36.7 C) (Oral)   Ht 5\' 4"  (1.626 m)   Wt (!) 316 lb 8 oz (143.6 kg)   LMP 04/05/2016   SpO2 98%   BMI 54.33 kg/m   General: Patient well-appearing, in no acute distress. CV: RRR, no murmurs or gallops auscultated Resp: CTAB, no wheezing, rales or rhonchi noted Derm: wound healing well without surrounding erythema or edema noted, no active purulent drainage or bleeding     ASSESSMENT/PLAN:   Wound check, abscess -wound appears to be healing very well, no systemic signs of active infectious etiology -continue daily dressing changes and covering to prevent future infection, advised to continue to use vaseline  -follow up in 1 week for wound check scheduled     Ripley Bogosian Robyne Peers, DO [facility]

## 2022-07-21 DIAGNOSIS — Z5189 Encounter for other specified aftercare: Secondary | ICD-10-CM | POA: Insufficient documentation

## 2022-07-21 NOTE — Assessment & Plan Note (Signed)
-  wound appears to be healing very well, no systemic signs of active infectious etiology -continue daily dressing changes and covering to prevent future infection, advised to continue to use vaseline  -follow up in 1 week for wound check scheduled

## 2022-07-24 ENCOUNTER — Other Ambulatory Visit: Payer: Self-pay

## 2022-07-24 DIAGNOSIS — R1033 Periumbilical pain: Secondary | ICD-10-CM

## 2022-07-24 DIAGNOSIS — R21 Rash and other nonspecific skin eruption: Secondary | ICD-10-CM

## 2022-07-25 MED ORDER — TRAMADOL HCL 50 MG PO TABS
50.0000 mg | ORAL_TABLET | Freq: Two times a day (BID) | ORAL | 0 refills | Status: DC | PRN
Start: 2022-07-25 — End: 2022-08-27

## 2022-07-25 MED ORDER — NYSTATIN 100000 UNIT/GM EX POWD: 1.0000 | Freq: Two times a day (BID) | CUTANEOUS | 2 refills | Status: DC

## 2022-07-27 ENCOUNTER — Ambulatory Visit: Payer: Self-pay | Admitting: Family Medicine

## 2022-07-29 ENCOUNTER — Other Ambulatory Visit: Payer: Self-pay | Admitting: Family Medicine

## 2022-07-29 DIAGNOSIS — R1033 Periumbilical pain: Secondary | ICD-10-CM

## 2022-08-03 ENCOUNTER — Other Ambulatory Visit: Payer: Self-pay

## 2022-08-03 ENCOUNTER — Encounter: Payer: Self-pay | Admitting: Family Medicine

## 2022-08-03 ENCOUNTER — Ambulatory Visit (INDEPENDENT_AMBULATORY_CARE_PROVIDER_SITE_OTHER): Payer: Medicaid Other | Admitting: Family Medicine

## 2022-08-03 VITALS — BP 142/81 | HR 95 | Ht 64.0 in | Wt 322.0 lb

## 2022-08-03 DIAGNOSIS — M1990 Unspecified osteoarthritis, unspecified site: Secondary | ICD-10-CM | POA: Diagnosis not present

## 2022-08-03 DIAGNOSIS — K625 Hemorrhage of anus and rectum: Secondary | ICD-10-CM | POA: Diagnosis not present

## 2022-08-03 DIAGNOSIS — A048 Other specified bacterial intestinal infections: Secondary | ICD-10-CM | POA: Diagnosis not present

## 2022-08-03 DIAGNOSIS — M179 Osteoarthritis of knee, unspecified: Secondary | ICD-10-CM

## 2022-08-03 DIAGNOSIS — Z5189 Encounter for other specified aftercare: Secondary | ICD-10-CM

## 2022-08-03 DIAGNOSIS — K6289 Other specified diseases of anus and rectum: Secondary | ICD-10-CM | POA: Diagnosis not present

## 2022-08-03 MED ORDER — HYDROCORTISONE (PERIANAL) 2.5 % EX CREA
1.0000 | TOPICAL_CREAM | Freq: Two times a day (BID) | CUTANEOUS | 2 refills | Status: DC
Start: 2022-08-03 — End: 2022-10-23

## 2022-08-03 NOTE — Patient Instructions (Signed)
Good to see you today - Thank you for coming in  Things we discussed today:  1) Your abdomen wound is healing nicely! Continue to apply vaseline daily to it and keep it covered. We can check it again 1 month.  2) We will check your H pylori test again today. If it is positive, I will send you to GI.  3) I will resend an order for physical therapy.  Please always bring your medication bottles  Come back to see me in 1 month.

## 2022-08-03 NOTE — Progress Notes (Signed)
    SUBJECTIVE:   CHIEF COMPLAINT / HPI:   DM is a 57 year old F presenting for H. pylori follow-up and wound check.  H Pylori Finished antibiotic course greater than 14 days ago.  Wound check Reports that wound is continuing to heal, is continuing to clean and apply Vaseline daily, and bandage changes.  PERTINENT  PMH / PSH: T2DM, chronic OA, abm abscess, H pylori infxn  OBJECTIVE:   BP (!) 142/81   Pulse 95   Ht 5\' 4"  (1.626 m)   Wt (!) 322 lb (146.1 kg)   LMP 04/05/2016   SpO2 98%   BMI 55.27 kg/m   General: Alert, friendly woman sitting up in chair.  NAD. HEENT: NCAT.  MMM. CV: RRR Respiratory: Normal work of breathing on room air Abdomen: Prior wound well-healed, dry, nonerythematous. Psych: Mood and affect appropriate  ASSESSMENT/PLAN:   H. pylori infection S/p clarithromycin triple therapy, pylori breath test remains positive today. -Referral to GI for refractory H. pylori  Wound check, abscess Abdominal abscess wound healing well and no signs of infection.  Can space out wound check to 1 month later.  Continue wound care.  Chronic osteoarthritis Requesting renewed order for physical therapy for insurance.  Patient continues to have chronic bilateral osteoarthritis, PT would be beneficial for pain control and mobility issues.  Order for PT sent.    Lincoln Brigham, MD [facility]

## 2022-08-05 LAB — H. PYLORI BREATH TEST: H pylori Breath Test: POSITIVE — AB

## 2022-08-06 NOTE — Assessment & Plan Note (Signed)
Requesting renewed order for physical therapy for insurance.  Patient continues to have chronic bilateral osteoarthritis, PT would be beneficial for pain control and mobility issues.  Order for PT sent.

## 2022-08-06 NOTE — Assessment & Plan Note (Signed)
S/p clarithromycin triple therapy, pylori breath test remains positive today. -Referral to GI for refractory H. pylori

## 2022-08-06 NOTE — Assessment & Plan Note (Signed)
Abdominal abscess wound healing well and no signs of infection.  Can space out wound check to 1 month later.  Continue wound care.

## 2022-08-10 ENCOUNTER — Ambulatory Visit (INDEPENDENT_AMBULATORY_CARE_PROVIDER_SITE_OTHER): Payer: Medicaid Other | Admitting: Family Medicine

## 2022-08-10 ENCOUNTER — Other Ambulatory Visit: Payer: Self-pay

## 2022-08-10 ENCOUNTER — Encounter: Payer: Self-pay | Admitting: Family Medicine

## 2022-08-10 VITALS — BP 120/71 | HR 99 | Ht 64.0 in | Wt 321.0 lb

## 2022-08-10 DIAGNOSIS — A048 Other specified bacterial intestinal infections: Secondary | ICD-10-CM | POA: Diagnosis not present

## 2022-08-10 NOTE — Patient Instructions (Signed)
Good to see you today - Thank you for coming in  Things we discussed today:  1) A referral has been sent to Gastroenterology. Expect a phone call next week for an appointment.  Please always bring your medication bottles

## 2022-08-10 NOTE — Progress Notes (Signed)
    SUBJECTIVE:   CHIEF COMPLAINT / HPI:   DM is a 57yo F h/f f/u of abm and H pylori. She is concerned that her H pylori is still positive despite repeat antibx courses, is agreeable to GI referral. She als reports that she gets tight abm pain when she tries to bend forward, and worries about a bulging sensation in upper abm. She wonders if CT scans or other imaging would be helpful, she is most worried about potential malignancy.   PERTINENT  PMH / PSH: T2DM, h pylori, chronic OA, chronic abm pain  OBJECTIVE:   BP 120/71   Pulse 99   Ht 5\' 4"  (1.626 m)   Wt (!) 321 lb (145.6 kg)   LMP 04/05/2016   SpO2 97%   BMI 55.10 kg/m   Gen: Alert, pleasant woman sitting in chair. NAD. HEENT: NCAT. MMM. Resp: Normal WOB on RA CV: RRR Abm: Tender in RUQ, LUQ. Soft. BS present.  Skin: warm, well perfused  ASSESSMENT/PLAN:   H. pylori infection Continues to have epigastric/upper abm pain, associated with bulging sensation and diarrhea w/ eating. Failed bismuth quadruple and clarithromycin triple therapy, confirmed w/ H pylori breath test. Will defer further imaging to GI. - Referral already placed to GI   Lincoln Brigham, MD [facility]

## 2022-08-13 NOTE — Assessment & Plan Note (Addendum)
Continues to have epigastric/upper abm pain, associated with bulging sensation and diarrhea w/ eating. Failed bismuth quadruple and clarithromycin triple therapy, confirmed w/ H pylori breath test. Will defer further imaging to GI. - Referral already placed to GI

## 2022-08-15 ENCOUNTER — Telehealth: Payer: Self-pay | Admitting: Family Medicine

## 2022-08-15 NOTE — Telephone Encounter (Signed)
Rita Bass dropped off form at front desk for herself.  Verified that patient section of form has been completed.  Last DOS/WCC with PCP was 08/10/2022.  Placed form in red team folder to be completed by clinical staff.  Amy Shaune Spittle

## 2022-08-16 NOTE — Telephone Encounter (Signed)
Form has been placed in your box, please complete when you have time. Thank you. Penni Bombard CMA

## 2022-08-17 NOTE — Telephone Encounter (Signed)
Patient presents to clinic for paperwork.   Copy made and provided for patient.   Veronda Prude, RN

## 2022-08-17 NOTE — Telephone Encounter (Signed)
Form has been placed in box to be faxed. Penni Bombard CMA

## 2022-08-27 ENCOUNTER — Other Ambulatory Visit: Payer: Self-pay

## 2022-08-27 DIAGNOSIS — R1033 Periumbilical pain: Secondary | ICD-10-CM

## 2022-08-28 ENCOUNTER — Telehealth: Payer: Self-pay | Admitting: Physician Assistant

## 2022-08-28 MED ORDER — TRAMADOL HCL 50 MG PO TABS
50.0000 mg | ORAL_TABLET | Freq: Two times a day (BID) | ORAL | 0 refills | Status: DC | PRN
Start: 2022-08-28 — End: 2022-09-25

## 2022-08-28 NOTE — Telephone Encounter (Signed)
Patient c/o chronic epigastric abdominal pain, sharp in nature as well as bloating and abdominal distention; no vomiting. Patient also describes multiple diarrheal bowel movements almost immediately after eating, Denies any blood in stool, rectal pain.  Recently treated for H Pylori (on several occasions apparently) but says that she continues to remain h pylori positive and PCP wanted GI input on this as well as the abdominal pain she describes.  Patient scheduled to see A.Steffanie Dunn, PA-C on 09/03/22 at 9:30 am. Patient aware.

## 2022-08-28 NOTE — Telephone Encounter (Signed)
Inbound call from patient stating that she has a referral from her family medicine practice to be seen for  H. pylori infection. Patient states she wants to see Dr. Barron Alvine. Her family medicine doctor states he believes she needs another colonoscopy. Patient is requesting a call back to futher discuss and to see if she needs to schedule a office visit or procedure. Please advise.

## 2022-08-29 ENCOUNTER — Ambulatory Visit: Payer: Medicaid Other | Admitting: Physical Therapy

## 2022-08-30 NOTE — Progress Notes (Deleted)
08/30/2022 Rita Bass [id number] [birth year]  Referring provider: Lincoln Brigham, MD Primary GI doctor: Dr. Barron Alvine  ASSESSMENT AND PLAN:    No orders of the defined types were placed in this encounter.    History of Present Illness:  57 y.o. female  with a past medical history of asthma, diabetes, GERD, sleep apnea, liver steatosis, internal and others listed below, returns to clinic today for evaluation of hemorrhoids.  05/11/2021 colonoscopy with Dr. Carloyn Manner, showed grade 2 internal hemorrhoids, foal active colitis post likely NSAIDS, meds Recall colon 10 years.  She had laproscopic umblical hernia repair  08/16/2021   09/22/2021 office visit with myself for rectal pain with rectal exam too painful for evaluation, no visible fissure seen, given diltiazem/lidocaine and set up for flex sig with Dr. Barron Alvine which was not done. At that visit CRP 15, sed rate 40, Hgb 11.9, MCV 75.7  Had H pylori breath test positive with PCP for periumbilical pain.  12/26/28/2023 positive H. pylori breath test with PCP sent in Pylera 05/04/2022 positive H. pylori breath test treated again with Pylera 06/14/2022 positive H. pylori breath test treated with clarithromycin triple therapy 08/03/2022 positive H. pylori breath test referred here.   Patient was complaining of periumbilical abdominal pain, PCP thought secondary to H. pylori. Ended up having right lower pannus cutaneous abscess I&D placed on doxycycline 4/14.   Current Medications:   Current Outpatient Medications (Endocrine & Metabolic):    empagliflozin (JARDIANCE) 25 MG TABS tablet, Take 1 tablet (25 mg total) by mouth daily.   metFORMIN (GLUCOPHAGE-XR) 500 MG 24 hr tablet, Take 2 tablets (1,000 mg total) by mouth 2 (two) times daily.   Semaglutide, 2 MG/DOSE, (OZEMPIC, 2 MG/DOSE,) 8 MG/3ML SOPN, Inject 2 mg into the skin once a week.  Current Outpatient Medications (Cardiovascular):    lisinopril (ZESTRIL) 10 MG  tablet, TAKE 1 TABLET(10 MG) BY MOUTH DAILY  Current Outpatient Medications (Respiratory):    albuterol (PROVENTIL) (2.5 MG/3ML) 0.083% nebulizer solution, Take 2.5 mg by nebulization every 6 (six) hours as needed.   albuterol (PROVENTIL) (5 MG/ML) 0.5% nebulizer solution, Take 0.5 mLs (2.5 mg total) by nebulization every 6 (six) hours as needed for wheezing or shortness of breath.   benzonatate (TESSALON PERLES) 100 MG capsule, Take 2 capsules (200 mg total) by mouth 3 (three) times daily as needed for cough.   cetirizine (ZYRTEC) 10 MG tablet, Take 1 tablet (10 mg total) by mouth daily. (Patient not taking: Reported on 10/05/2021)   fluticasone (FLONASE) 50 MCG/ACT nasal spray, Place 2 sprays into both nostrils daily. (Patient not taking: Reported on 01/15/2022)   guaiFENesin (MUCINEX) 600 MG 12 hr tablet, Take 1 tablet (600 mg total) by mouth 2 (two) times daily as needed for cough or to loosen phlegm.  Current Outpatient Medications (Analgesics):    acetaminophen (TYLENOL) 650 MG CR tablet, Take 650-1,300 mg by mouth every 8 (eight) hours as needed for pain. Last dose (2) around 0930 am.   ibuprofen (ADVIL) 800 MG tablet, Take 1 tablet (800 mg total) by mouth 3 (three) times daily. For headache.   traMADol (ULTRAM) 50 MG tablet, Take 1 tablet (50 mg total) by mouth every 12 (twelve) hours as needed. Not to be refilled before 30 days   Current Outpatient Medications (Other):    Accu-Chek Softclix Lancets lancets, Use as instructed   AMBULATORY NON FORMULARY MEDICATION, Medication Name: Diltiazem 2%/Lidocaine 5%- Using your index finger, apply a small amount of medication inside the rectum  up to your first knuckle/joint three times daily x 8 weeks. (Patient not taking: Reported on 01/15/2022)   BINAXNOW COVID-19 AG HOME TEST KIT, TEST AS DIRECTED TODAY   doxycycline (VIBRAMYCIN) 100 MG capsule, Take 1 capsule (100 mg total) by mouth 2 (two) times daily.   DULoxetine (CYMBALTA) 20 MG capsule,  Take 1 capsule (20 mg total) by mouth daily.   gabapentin (NEURONTIN) 100 MG capsule, Take 4 capsules (400 mg total) by mouth 3 (three) times daily.   glucose blood (ACCU-CHEK GUIDE) test strip, 1 each by Other route daily. for testing   hydrocortisone (ANUSOL-HC) 2.5 % rectal cream, Place 1 Application rectally 2 (two) times daily.   Insulin Pen Needle (PEN NEEDLES) 32G X 5 MM MISC, Use as directed with victoza   nystatin (MYCOSTATIN/NYSTOP) powder, Apply 1 Application topically 2 (two) times daily.   olopatadine (PATANOL) 0.1 % ophthalmic solution, Place 1 drop into both eyes 2 (two) times daily. (Patient not taking: Reported on 09/22/2021)   omeprazole (PRILOSEC) 40 MG capsule, Take 1 capsule (40 mg total) by mouth daily.   ondansetron (ZOFRAN) 4 MG tablet, Take 1 tablet (4 mg total) by mouth every 8 (eight) hours as needed for nausea or vomiting. (Patient not taking: Reported on 01/15/2022)  Surgical History:  She  has a past surgical history that includes Oophorectomy; Cesarean section; Cholecystectomy; Hernia repair; Tubal ligation; Kidney stone surgery; transthoracic echocardiogram (09/2017); Esophagogastroduodenoscopy (egd) with propofol (N/A, 11/01/2020); biopsy (11/01/2020); Upper gastrointestinal endoscopy; Colonoscopy with propofol (N/A, 05/11/2021); and biopsy (05/11/2021). Family History:  Her family history includes Breast cancer in her mother; Diabetes in her father; Heart attack in her maternal aunt; Other in her father. Social History:   reports that she has never smoked. She has never used smokeless tobacco. She reports that she does not drink alcohol and does not use drugs.  Current Medications, Allergies, Past Medical History, Past Surgical History, Family History and Social History were reviewed in [employer] record.  Physical Exam: LMP 04/05/2016  General:   Pleasant, well developed female in no acute distress Heart : Regular rate and rhythm; no  murmurs Pulm: Clear anteriorly; no wheezing Abdomen:  Soft, morbidly Obese AB, Active bowel sounds. mild tenderness in the entire abdomen.  Well-healed laparoscopic surgical scars Without guarding and Without rebound, No organomegaly appreciated. Rectal: External hemorrhoidal skin tags, 1 small flesh-colored tag left lateral possible prolapsed internal hemorrhoid, no visible fissures, increased rectal tone unable to evaluate internal exam due to exquisite tenderness, hemoccult N/A Extremities:  with  edema. Neurologic:  Alert and  oriented x4;  No focal deficits.  Psych:  Cooperative. Normal mood and affect.   Doree Albee, PA-C 08/30/22

## 2022-09-03 ENCOUNTER — Ambulatory Visit: Payer: Medicaid Other | Admitting: Physician Assistant

## 2022-09-03 ENCOUNTER — Telehealth: Payer: Self-pay | Admitting: Physician Assistant

## 2022-09-03 NOTE — Telephone Encounter (Signed)
Noted  

## 2022-09-03 NOTE — Telephone Encounter (Signed)
PT rescheduled appt for today. She has her grandkids and can't come in.

## 2022-09-06 ENCOUNTER — Ambulatory Visit: Payer: Medicaid Other | Admitting: Family Medicine

## 2022-09-07 ENCOUNTER — Other Ambulatory Visit: Payer: Self-pay | Admitting: Family Medicine

## 2022-09-07 DIAGNOSIS — A048 Other specified bacterial intestinal infections: Secondary | ICD-10-CM

## 2022-09-12 NOTE — Progress Notes (Signed)
SUBJECTIVE:   CHIEF COMPLAINT / HPI:   DM is a 57yo F h/f osteoarthritis f/u.  OA - Was working with PT, but they stopped until she can get braces fitted for her knees. She tried to get fitted for braces yesterday, but they didn't fit because the swelling in her legs changed from when they first did measurements. So they will need to order different braces again. She is also worried that her knee bone may be sticking out in a weird way, wonders if this may also be contributing to braces not fitting.  - Additionally, pain in BL knees continues to worsen and having more difficulty ambulating and worried about falling and breaking her bones.  - Pain is now keeping her up at night and having difficulty sleeping due to it.  - Her PT and pharmacist told her to ask her provider for potential escalation in pain medications. Pt is currently taking Tramadol q12h. - Trying Voltaren and tiger balm, but minimally helpful  H Pylori infection - GI will see August 6th, but maybe sooner if there's a cancellation.   T2DM Agreeable to increasing Ozempic. Due for foot exam.  PERTINENT  PMH / PSH: T2DM, H pylori infection, chronic abdominal pain, chronic BL osteoarthritis, obesity  OBJECTIVE:   BP (!) 142/65   Pulse 93   Ht 5\' 4"  (1.626 m)   Wt (!) 323 lb 6.4 oz (146.7 kg)   LMP 04/05/2016   SpO2 96%   BMI 55.51 kg/m    Gen: Alert, sitting comfortably. NAD. HEENT: NCAT. MMM. CV: RRR Resp: Normal WOB on RA MsK:  - tender to palpation along infero-medial aspect of BL knees.  - Crepitus along inferolateral aspect of R knee - Limited flexion and extension of BL knees due to pain - No clear knee effusions, although difficult to assess due to body habitus  Foot Exam:  BL sensation intact. 2+ dorsalis pedis in R foot, 1+ Dorsalis in L foot. Toenails painted, no deformities.  ASSESSMENT/PLAN:   Type 2 diabetes mellitus with complication (HCC) Most recently A1c 8.8, above goal. Getting  diabetic shoes. Tolerating Ozempic well.  - Increase Ozempic to 2.4mg  weekly - Cont metformin - Foot exam performed today - f/u in 1 month for repeat A1c  Chronic osteoarthritis BL OA of knees limiting function, interfering with sleep, and increasing risk for falls. Has been working with PT, but still having a lot of pain. In process of getting fitted for knee braces. Pt requesting increase in pain medications, however I want to be cautious as pt is already on chronic opioid therapy with Tramadol. Will refer pt to Sports Medicine for further evaluation and potential steroid knee injections, especially now that diabetes is getting better controlled. Will increase ozempic as above. Orthopedic surgery is considered, however pt is a poor surgical candidate at this time due to BMI, will reconsider in future if BMI decreases.  - Referral to Sports Medicine - Continue PT - Continue Tramadol 50mg  BID  H. pylori infection Has appointment with GI Aug 6th. Encouraged pt to call back to see if sooner slots have opened up. Additionally, pt reports difficulty having her H pylori breath tests covered by insurance. These tests were medically indicated because it is standard of care to test-for-cure after each round of treatment for H pylori. I provided pt with [facility] customer service phone number and encouraged to reach out to get these tests covered.    Lincoln Brigham, MD [facility]  

## 2022-09-13 ENCOUNTER — Encounter: Payer: Self-pay | Admitting: Family Medicine

## 2022-09-13 ENCOUNTER — Other Ambulatory Visit: Payer: Self-pay

## 2022-09-13 ENCOUNTER — Ambulatory Visit (INDEPENDENT_AMBULATORY_CARE_PROVIDER_SITE_OTHER): Payer: Medicaid Other | Admitting: Family Medicine

## 2022-09-13 VITALS — BP 142/65 | HR 93 | Ht 64.0 in | Wt 323.4 lb

## 2022-09-13 DIAGNOSIS — E118 Type 2 diabetes mellitus with unspecified complications: Secondary | ICD-10-CM | POA: Diagnosis not present

## 2022-09-13 DIAGNOSIS — M179 Osteoarthritis of knee, unspecified: Secondary | ICD-10-CM | POA: Diagnosis not present

## 2022-09-13 DIAGNOSIS — A048 Other specified bacterial intestinal infections: Secondary | ICD-10-CM

## 2022-09-13 DIAGNOSIS — M1990 Unspecified osteoarthritis, unspecified site: Secondary | ICD-10-CM

## 2022-09-13 MED ORDER — OZEMPIC (2 MG/DOSE) 8 MG/3ML ~~LOC~~ SOPN
2.4000 mg | PEN_INJECTOR | SUBCUTANEOUS | 11 refills | Status: DC
Start: 2022-09-13 — End: 2022-10-22

## 2022-09-13 NOTE — Patient Instructions (Signed)
Good to see you today - Thank you for coming in  Things we discussed today:  1) For your knee pain, I will send a referral for you to see the Sports Medicine doctors. I want them to evaluate your knee for potentially giving steroid injections.   2) Let's increase your Ozempic to 2.4mg  weekly.  Please always bring your medication bottles  Come back to see me in 1 month to recheck your A1c.

## 2022-09-14 NOTE — Assessment & Plan Note (Signed)
Has appointment with GI Aug 6th. Encouraged pt to call back to see if sooner slots have opened up. Additionally, pt reports difficulty having her H pylori breath tests covered by insurance. These tests were medically indicated because it is standard of care to test-for-cure after each round of treatment for H pylori. I provided pt with [facility] customer service phone number and encouraged to reach out to get these tests covered.

## 2022-09-14 NOTE — Assessment & Plan Note (Addendum)
Most recently A1c 8.8, above goal. Getting diabetic shoes. Tolerating Ozempic well.  - Increase Ozempic to 2.4mg  weekly - Cont metformin - Foot exam performed today - f/u in 1 month for repeat A1c

## 2022-09-14 NOTE — Assessment & Plan Note (Signed)
BL OA of knees limiting function, interfering with sleep, and increasing risk for falls. Has been working with PT, but still having a lot of pain. In process of getting fitted for knee braces. Pt requesting increase in pain medications, however I want to be cautious as pt is already on chronic opioid therapy with Tramadol. Will refer pt to Sports Medicine for further evaluation and potential steroid knee injections, especially now that diabetes is getting better controlled. Will increase ozempic as above. Orthopedic surgery is considered, however pt is a poor surgical candidate at this time due to BMI, will reconsider in future if BMI decreases.  - Referral to Sports Medicine - Continue PT - Continue Tramadol 50mg  BID

## 2022-09-19 ENCOUNTER — Ambulatory Visit: Payer: Medicaid Other | Admitting: Physical Therapy

## 2022-09-25 ENCOUNTER — Other Ambulatory Visit: Payer: Self-pay

## 2022-09-25 DIAGNOSIS — R1084 Generalized abdominal pain: Secondary | ICD-10-CM

## 2022-09-25 DIAGNOSIS — R1033 Periumbilical pain: Secondary | ICD-10-CM

## 2022-09-26 ENCOUNTER — Telehealth: Payer: Self-pay

## 2022-09-26 DIAGNOSIS — E118 Type 2 diabetes mellitus with unspecified complications: Secondary | ICD-10-CM

## 2022-09-26 MED ORDER — GABAPENTIN 100 MG PO CAPS
400.0000 mg | ORAL_CAPSULE | Freq: Three times a day (TID) | ORAL | 2 refills | Status: DC
Start: 2022-09-26 — End: 2022-12-27

## 2022-09-26 MED ORDER — TRAMADOL HCL 50 MG PO TABS
50.0000 mg | ORAL_TABLET | Freq: Two times a day (BID) | ORAL | 0 refills | Status: DC | PRN
Start: 2022-09-26 — End: 2022-10-22

## 2022-09-26 NOTE — Telephone Encounter (Signed)
Patient calls nurse line in regards to Ozempic.   She reports back order on the medication. She reports the pharmacy is unsure when they will receive shipment.   She reports the pharmacist suggested going back on Metformin in the meantime. She reports last Ozempic shot was Saturday.  Will forward to PCP.

## 2022-09-26 NOTE — Telephone Encounter (Signed)
Patient LVM on nurse line regarding medical records needed at [facility].   Called [facility]. They are requesting medical records related to foot exam.   Medical records from visit on 09/13/22 printed and faxed to [facility].   Veronda Prude, RN

## 2022-09-27 MED ORDER — METFORMIN HCL ER 500 MG PO TB24: 1000.0000 mg | ORAL_TABLET | Freq: Two times a day (BID) | ORAL | 3 refills | Status: DC

## 2022-09-27 NOTE — Telephone Encounter (Signed)
Patient has been updated.

## 2022-10-03 ENCOUNTER — Ambulatory Visit: Payer: Medicaid Other | Admitting: Sports Medicine

## 2022-10-03 ENCOUNTER — Ambulatory Visit: Payer: Medicaid Other

## 2022-10-03 ENCOUNTER — Encounter: Payer: Self-pay | Admitting: Sports Medicine

## 2022-10-03 VITALS — BP 126/76 | HR 84 | Ht 64.0 in | Wt 323.0 lb

## 2022-10-03 DIAGNOSIS — M170 Bilateral primary osteoarthritis of knee: Secondary | ICD-10-CM

## 2022-10-03 DIAGNOSIS — E1121 Type 2 diabetes mellitus with diabetic nephropathy: Secondary | ICD-10-CM | POA: Diagnosis not present

## 2022-10-03 MED ORDER — METHYLPREDNISOLONE ACETATE 40 MG/ML IJ SUSP
40.0000 mg | Freq: Once | INTRAMUSCULAR | Status: AC
  Administered 2022-10-03: 40 mg via INTRA_ARTICULAR

## 2022-10-03 MED ORDER — MELOXICAM 15 MG PO TABS
ORAL_TABLET | ORAL | 2 refills | Status: DC
Start: 2022-10-03 — End: 2023-02-05

## 2022-10-03 NOTE — Progress Notes (Signed)
Rita Bass - 57 y.o. female MRN [id number]  Date of birth: [birth year]  PCP: Lincoln Brigham, MD  Subjective:  No chief complaint on file.  Bilateral knee arthritis  HPI: Past Medical, Surgical, Social, and Family History Reviewed & Updated per EMR.   Patient is a 57 y.o. female here for bilateral knee pain and a hx of known OA. She was going to another orthopedic clinic and receiving steroid injections years ago which helped but they were stopped and she is unsure why. Her PCP has been managing pain w/ gabapentin and tramadol. She has struggled w/ obesity, she was >400lbs which marked weight loss to 280 but this has increased to 323 now due to inactivity from pain. She has diabetes and her last A1c was 6.8. She is on Metformin as she has been unable to obtain Ozempic which was helping with her weight loss. Her glc yesterday a.m. was 160 and she has not eaten this morning.  Her knee pain has been interfering w/ daily activities and stairs have been very difficult for her to navigate. She is unable to exercise but wants to. She is going to [facility] next week for knee braces due to her patellar tracking issues. She denies any trauma, edema, erythema, or warmth of the knee joints.   Past Medical History:  Diagnosis Date   Arthritis    Asthma    Chronic pain of left knee    Complication of anesthesia    PONV   Diabetes (HCC)    GERD (gastroesophageal reflux disease)    H/O bronchitis    Hepatic steatosis    Internal hemorrhoids    Kidney stones    Obesity    Renal disorder    Shortness of breath dyspnea    Sleep apnea    past issues - at weight over 400lbs   Sleep apnea in adult    Polysomnogram pending.  Followed by Dr. Craige Cotta    Current Outpatient Medications on File Prior to Visit  Medication Sig Dispense Refill   Accu-Chek Softclix Lancets lancets Use as instructed 100 each 12   acetaminophen (TYLENOL) 650 MG CR tablet Take 650-1,300 mg by mouth every 8 (eight) hours as  needed for pain. Last dose (2) around 0930 am.     albuterol (PROVENTIL) (2.5 MG/3ML) 0.083% nebulizer solution Take 2.5 mg by nebulization every 6 (six) hours as needed.     albuterol (PROVENTIL) (5 MG/ML) 0.5% nebulizer solution Take 0.5 mLs (2.5 mg total) by nebulization every 6 (six) hours as needed for wheezing or shortness of breath. 20 mL 0   AMBULATORY NON FORMULARY MEDICATION Medication Name: Diltiazem 2%/Lidocaine 5%- Using your index finger, apply a small amount of medication inside the rectum up to your first knuckle/joint three times daily x 8 weeks. (Patient not taking: Reported on 01/15/2022) 30 g 1   benzonatate (TESSALON PERLES) 100 MG capsule Take 2 capsules (200 mg total) by mouth 3 (three) times daily as needed for cough. 30 capsule 0   BINAXNOW COVID-19 AG HOME TEST KIT TEST AS DIRECTED TODAY     cetirizine (ZYRTEC) 10 MG tablet Take 1 tablet (10 mg total) by mouth daily. (Patient not taking: Reported on 10/05/2021) 30 tablet 11   doxycycline (VIBRAMYCIN) 100 MG capsule Take 1 capsule (100 mg total) by mouth 2 (two) times daily. 20 capsule 0   DULoxetine (CYMBALTA) 20 MG capsule Take 1 capsule (20 mg total) by mouth daily. 60 capsule 3   empagliflozin (JARDIANCE)  25 MG TABS tablet Take 1 tablet (25 mg total) by mouth daily. 90 tablet 3   fluticasone (FLONASE) 50 MCG/ACT nasal spray Place 2 sprays into both nostrils daily. (Patient not taking: Reported on 01/15/2022) 16 g 6   gabapentin (NEURONTIN) 100 MG capsule Take 4 capsules (400 mg total) by mouth 3 (three) times daily. 360 capsule 2   glucose blood (ACCU-CHEK GUIDE) test strip 1 each by Other route daily. for testing 100 strip 1   guaiFENesin (MUCINEX) 600 MG 12 hr tablet Take 1 tablet (600 mg total) by mouth 2 (two) times daily as needed for cough or to loosen phlegm. 20 tablet 0   hydrocortisone (ANUSOL-HC) 2.5 % rectal cream Place 1 Application rectally 2 (two) times daily. 30 g 2   ibuprofen (ADVIL) 800 MG tablet Take 1  tablet (800 mg total) by mouth 3 (three) times daily. For headache. 15 tablet 0   Insulin Pen Needle (PEN NEEDLES) 32G X 5 MM MISC Use as directed with victoza 30 each 3   lisinopril (ZESTRIL) 10 MG tablet TAKE 1 TABLET(10 MG) BY MOUTH DAILY 30 tablet 3   metFORMIN (GLUCOPHAGE-XR) 500 MG 24 hr tablet Take 2 tablets (1,000 mg total) by mouth 2 (two) times daily. 360 tablet 3   nystatin (MYCOSTATIN/NYSTOP) powder Apply 1 Application topically 2 (two) times daily. 60 g 2   olopatadine (PATANOL) 0.1 % ophthalmic solution Place 1 drop into both eyes 2 (two) times daily. (Patient not taking: Reported on 09/22/2021) 5 mL 12   omeprazole (PRILOSEC) 20 MG capsule Take 1 capsule (20 mg total) by mouth daily. 90 capsule 3   ondansetron (ZOFRAN) 4 MG tablet Take 1 tablet (4 mg total) by mouth every 8 (eight) hours as needed for nausea or vomiting. (Patient not taking: Reported on 01/15/2022) 30 tablet 0   Semaglutide, 2 MG/DOSE, (OZEMPIC, 2 MG/DOSE,) 8 MG/3ML SOPN Inject 2.4 mg into the skin once a week. 3 mL 11   traMADol (ULTRAM) 50 MG tablet Take 1 tablet (50 mg total) by mouth every 12 (twelve) hours as needed. Not to be refilled before 30 days 60 tablet 0   No current facility-administered medications on file prior to visit.    Past Surgical History:  Procedure Laterality Date   BIOPSY  11/01/2020   Procedure: BIOPSY;  Surgeon: Shellia Cleverly, DO;  Location: [facility];  Service: Gastroenterology;;   BIOPSY  05/11/2021   Procedure: BIOPSY;  Surgeon: Shellia Cleverly, DO;  Location: [facility];  Service: Gastroenterology;;   CESAREAN SECTION     6 c-sections   CHOLECYSTECTOMY     COLONOSCOPY WITH PROPOFOL N/A 05/11/2021   Procedure: COLONOSCOPY WITH PROPOFOL;  Surgeon: Shellia Cleverly, DO;  Location: [facility];  Service: Gastroenterology;  Laterality: N/A;   ESOPHAGOGASTRODUODENOSCOPY (EGD) WITH PROPOFOL N/A 11/01/2020   Procedure: ESOPHAGOGASTRODUODENOSCOPY (EGD) WITH PROPOFOL;   Surgeon: Shellia Cleverly, DO;  Location: [facility];  Service: Gastroenterology;  Laterality: N/A;   HERNIA REPAIR     KIDNEY STONE SURGERY     OOPHORECTOMY     TRANSTHORACIC ECHOCARDIOGRAM  09/2017   Technically difficult study.  Did not use Definity contrast.  EF was 55 to 60% with moderate LVH and grade 1 diastolic dysfunction.  No significant valvular lesions noted.  No regional wall motion normality but difficult to assess due to poor imaging.    TUBAL LIGATION     UPPER GASTROINTESTINAL ENDOSCOPY      No Known Allergies  Objective:  Physical Exam: VS: BP:126/76  HR:84bpm  TEMP: ( )  RESP:98 %  HT:5\' 4"  (162.6 cm)   WT:(!) 323 lb (146.5 kg)  BMI:55.42  Gen: NAD, comfortable in exam room MSK: Gait is antalgic waddling w/ difficulty moving onto exam table Respiratory: normal work of breathing on room air Skin: No rashes, abrasions, or ecchymosis Knees: she has lipodystrophy and excess tissue surrounding joint, no effusions, no overlying erythema or warmth, full extension elicits pain bilat, unable to test full flexion due to body habitus and positioning but able to flex to 95 degrees, varus and valgus stress non tender w/ good end points. Joint line tenderness lateral>medial on R and medial >lateral on L, patellar grind test positive. No baker's cysts appreciated. No sensory deficits, distal pulses in tact.   Procedure:  Risks and benefits of procedure were discussed with patient. After informed written consent timeout was performed, patient was seated on exam table. Bilateral knees were prepped with alcohol swab and utilizing midline lateral approach, patient's knees were injected intraarticularly with 3:1 lidocaine:depomedrol. Patient tolerated the procedure well without immediate complications.     Assessment & Plan:   Primary osteoarthritis of both knees - Previous Xrays of knees from 2019 reviewed today. Windswept appearance w/ advanced joint narrowing of the  lateral R and almost complete loss of joint space in the medial L knee. Numerous osteophyte formations.  - Her pain has made daily activities very difficult and she is gaining weight due to the decreased activity.  - Bilateral knees injected today. Pt tolerated well and no complications. Written consent obtained.  - Patient to follow up with PCP to obtain GLP-1 refill as this seemed to help her with weight loss in the past.  - Ultimately she would benefit from TKR but her BMI is currently too high. She will need to work on weight loss for a goal weight of approximately 230 for a BMI of <40. Hopefully the injections today will allow her to increase exercise and weight loss.     Rica Mote MD [facility] Fellow   Patient seen and evaluated with the sports medicine fellow.  I agree with the above plan of care.  Patient's knees injected with cortisone as above.  We explained that definitive treatment is total knee arthroplasty but her BMI precludes her from that at this time.  If cortisone injections are ineffective or only provide temporary relief, and consider viscosupplementation.  Follow-up as needed.

## 2022-10-03 NOTE — Progress Notes (Deleted)
PCP: Lincoln Brigham, MD  Subjective:   HPI: Patient is a 57 y.o. female here for bilateral knee pain in the setting of osteoarthritis.  ***  Past Medical History:  Diagnosis Date   Arthritis    Asthma    Chronic pain of left knee    Complication of anesthesia    PONV   Diabetes (HCC)    GERD (gastroesophageal reflux disease)    H/O bronchitis    Hepatic steatosis    Internal hemorrhoids    Kidney stones    Obesity    Renal disorder    Shortness of breath dyspnea    Sleep apnea    past issues - at weight over 400lbs   Sleep apnea in adult    Polysomnogram pending.  Followed by Dr. Craige Cotta    Current Outpatient Medications on File Prior to Visit  Medication Sig Dispense Refill   Accu-Chek Softclix Lancets lancets Use as instructed 100 each 12   acetaminophen (TYLENOL) 650 MG CR tablet Take 650-1,300 mg by mouth every 8 (eight) hours as needed for pain. Last dose (2) around 0930 am.     albuterol (PROVENTIL) (2.5 MG/3ML) 0.083% nebulizer solution Take 2.5 mg by nebulization every 6 (six) hours as needed.     albuterol (PROVENTIL) (5 MG/ML) 0.5% nebulizer solution Take 0.5 mLs (2.5 mg total) by nebulization every 6 (six) hours as needed for wheezing or shortness of breath. 20 mL 0   AMBULATORY NON FORMULARY MEDICATION Medication Name: Diltiazem 2%/Lidocaine 5%- Using your index finger, apply a small amount of medication inside the rectum up to your first knuckle/joint three times daily x 8 weeks. (Patient not taking: Reported on 01/15/2022) 30 g 1   benzonatate (TESSALON PERLES) 100 MG capsule Take 2 capsules (200 mg total) by mouth 3 (three) times daily as needed for cough. 30 capsule 0   BINAXNOW COVID-19 AG HOME TEST KIT TEST AS DIRECTED TODAY     cetirizine (ZYRTEC) 10 MG tablet Take 1 tablet (10 mg total) by mouth daily. (Patient not taking: Reported on 10/05/2021) 30 tablet 11   doxycycline (VIBRAMYCIN) 100 MG capsule Take 1 capsule (100 mg total) by mouth 2 (two) times daily. 20  capsule 0   DULoxetine (CYMBALTA) 20 MG capsule Take 1 capsule (20 mg total) by mouth daily. 60 capsule 3   empagliflozin (JARDIANCE) 25 MG TABS tablet Take 1 tablet (25 mg total) by mouth daily. 90 tablet 3   fluticasone (FLONASE) 50 MCG/ACT nasal spray Place 2 sprays into both nostrils daily. (Patient not taking: Reported on 01/15/2022) 16 g 6   gabapentin (NEURONTIN) 100 MG capsule Take 4 capsules (400 mg total) by mouth 3 (three) times daily. 360 capsule 2   glucose blood (ACCU-CHEK GUIDE) test strip 1 each by Other route daily. for testing 100 strip 1   guaiFENesin (MUCINEX) 600 MG 12 hr tablet Take 1 tablet (600 mg total) by mouth 2 (two) times daily as needed for cough or to loosen phlegm. 20 tablet 0   hydrocortisone (ANUSOL-HC) 2.5 % rectal cream Place 1 Application rectally 2 (two) times daily. 30 g 2   ibuprofen (ADVIL) 800 MG tablet Take 1 tablet (800 mg total) by mouth 3 (three) times daily. For headache. 15 tablet 0   Insulin Pen Needle (PEN NEEDLES) 32G X 5 MM MISC Use as directed with victoza 30 each 3   lisinopril (ZESTRIL) 10 MG tablet TAKE 1 TABLET(10 MG) BY MOUTH DAILY 30 tablet 3   metFORMIN (  GLUCOPHAGE-XR) 500 MG 24 hr tablet Take 2 tablets (1,000 mg total) by mouth 2 (two) times daily. 360 tablet 3   nystatin (MYCOSTATIN/NYSTOP) powder Apply 1 Application topically 2 (two) times daily. 60 g 2   olopatadine (PATANOL) 0.1 % ophthalmic solution Place 1 drop into both eyes 2 (two) times daily. (Patient not taking: Reported on 09/22/2021) 5 mL 12   omeprazole (PRILOSEC) 20 MG capsule Take 1 capsule (20 mg total) by mouth daily. 90 capsule 3   ondansetron (ZOFRAN) 4 MG tablet Take 1 tablet (4 mg total) by mouth every 8 (eight) hours as needed for nausea or vomiting. (Patient not taking: Reported on 01/15/2022) 30 tablet 0   Semaglutide, 2 MG/DOSE, (OZEMPIC, 2 MG/DOSE,) 8 MG/3ML SOPN Inject 2.4 mg into the skin once a week. 3 mL 11   traMADol (ULTRAM) 50 MG tablet Take 1 tablet (50 mg  total) by mouth every 12 (twelve) hours as needed. Not to be refilled before 30 days 60 tablet 0   No current facility-administered medications on file prior to visit.    Past Surgical History:  Procedure Laterality Date   BIOPSY  11/01/2020   Procedure: BIOPSY;  Surgeon: Shellia Cleverly, DO;  Location: [facility];  Service: Gastroenterology;;   BIOPSY  05/11/2021   Procedure: BIOPSY;  Surgeon: Shellia Cleverly, DO;  Location: [facility];  Service: Gastroenterology;;   CESAREAN SECTION     6 c-sections   CHOLECYSTECTOMY     COLONOSCOPY WITH PROPOFOL N/A 05/11/2021   Procedure: COLONOSCOPY WITH PROPOFOL;  Surgeon: Shellia Cleverly, DO;  Location: [facility];  Service: Gastroenterology;  Laterality: N/A;   ESOPHAGOGASTRODUODENOSCOPY (EGD) WITH PROPOFOL N/A 11/01/2020   Procedure: ESOPHAGOGASTRODUODENOSCOPY (EGD) WITH PROPOFOL;  Surgeon: Shellia Cleverly, DO;  Location: [facility];  Service: Gastroenterology;  Laterality: N/A;   HERNIA REPAIR     KIDNEY STONE SURGERY     OOPHORECTOMY     TRANSTHORACIC ECHOCARDIOGRAM  09/2017   Technically difficult study.  Did not use Definity contrast.  EF was 55 to 60% with moderate LVH and grade 1 diastolic dysfunction.  No significant valvular lesions noted.  No regional wall motion normality but difficult to assess due to poor imaging.    TUBAL LIGATION     UPPER GASTROINTESTINAL ENDOSCOPY      No Known Allergies  BP (!) 150/82 (BP Location: Left Arm, Patient Position: Sitting, Cuff Size: Large)   Pulse 84   Ht 5\' 4"  (1.626 m)   Wt (!) 323 lb (146.5 kg)   LMP 04/05/2016   SpO2 98%   BMI 55.44 kg/m       No data to display              No data to display              Objective:  Physical Exam:  Gen: NAD, comfortable in exam room  ***   Assessment & Plan:  1. ***

## 2022-10-03 NOTE — Assessment & Plan Note (Addendum)
-   Previous Xrays of knees from 2019 reviewed today. Windswept appearance w/ advanced joint narrowing of the lateral R and almost complete loss of joint space in the medial L knee. Numerous osteophyte formations.  - Her pain has made daily activities very difficult and she is gaining weight due to the decreased activity.  - Bilateral knees injected today. Pt tolerated well and no complications. Written consent obtained.  - Patient to follow up with PCP to obtain GLP-1 refill as this seemed to help her with weight loss in the past.  - Ultimately she would benefit from TKR but her BMI is currently too high. She will need to work on weight loss for a goal weight of approximately 230 for a BMI of <40. Hopefully the injections today will allow her to increase exercise and weight loss.

## 2022-10-11 ENCOUNTER — Ambulatory Visit: Payer: Self-pay | Admitting: Family Medicine

## 2022-10-11 NOTE — Telephone Encounter (Signed)
Received [employer] order form in RN box.  Faxed to [employer].

## 2022-10-12 ENCOUNTER — Telehealth: Payer: Self-pay

## 2022-10-12 NOTE — Telephone Encounter (Signed)
Pharmacy Patient Advocate Encounter   Received notification from CoverMyMeds that prior authorization for [facility] is required/requested.  PA required; PA submitted to [employer] via CoverMyMeds Key/confirmation #/EOC [id number] Status is pending

## 2022-10-15 ENCOUNTER — Other Ambulatory Visit (HOSPITAL_COMMUNITY): Payer: Self-pay

## 2022-10-15 NOTE — Therapy (Signed)
OUTPATIENT PHYSICAL THERAPY LOWER EXTREMITY EVALUATION   Patient Name: Rita Bass MRN: [id number] DOB:12-Jun-1965, 57 y.o., female Today's Date: 10/17/2022  END OF SESSION:  PT End of Session - 10/16/22 1646     Visit Number 1    Number of Visits 17    Date for PT Re-Evaluation 12/11/22    Authorization Type [employer]    PT Start Time 1615    PT Stop Time 1645    PT Time Calculation (min) 30 min    Activity Tolerance Patient limited by pain    Behavior During Therapy WFL for tasks assessed/performed             Past Medical History:  Diagnosis Date   Arthritis    Asthma    Chronic pain of left knee    Complication of anesthesia    PONV   Diabetes (HCC)    GERD (gastroesophageal reflux disease)    H/O bronchitis    Hepatic steatosis    Internal hemorrhoids    Kidney stones    Obesity    Renal disorder    Shortness of breath dyspnea    Sleep apnea    past issues - at weight over 400lbs   Sleep apnea in adult    Polysomnogram pending.  Followed by Dr. Craige Cotta   Past Surgical History:  Procedure Laterality Date   BIOPSY  11/01/2020   Procedure: BIOPSY;  Surgeon: Shellia Cleverly, DO;  Location: [facility];  Service: Gastroenterology;;   BIOPSY  05/11/2021   Procedure: BIOPSY;  Surgeon: Shellia Cleverly, DO;  Location: [facility];  Service: Gastroenterology;;   CESAREAN SECTION     6 c-sections   CHOLECYSTECTOMY     COLONOSCOPY WITH PROPOFOL N/A 05/11/2021   Procedure: COLONOSCOPY WITH PROPOFOL;  Surgeon: Shellia Cleverly, DO;  Location: [facility];  Service: Gastroenterology;  Laterality: N/A;   ESOPHAGOGASTRODUODENOSCOPY (EGD) WITH PROPOFOL N/A 11/01/2020   Procedure: ESOPHAGOGASTRODUODENOSCOPY (EGD) WITH PROPOFOL;  Surgeon: Shellia Cleverly, DO;  Location: [facility];  Service: Gastroenterology;  Laterality: N/A;   HERNIA REPAIR     KIDNEY STONE SURGERY     OOPHORECTOMY     TRANSTHORACIC ECHOCARDIOGRAM  09/2017   Technically  difficult study.  Did not use Definity contrast.  EF was 55 to 60% with moderate LVH and grade 1 diastolic dysfunction.  No significant valvular lesions noted.  No regional wall motion normality but difficult to assess due to poor imaging.    TUBAL LIGATION     UPPER GASTROINTESTINAL ENDOSCOPY     Patient Active Problem List   Diagnosis Date Noted   Primary osteoarthritis of both knees 10/03/2022   Wound check, abscess 07/21/2022   Type 2 diabetes mellitus with diabetic neuropathy, unspecified (HCC) 04/30/2022   Chronic osteoarthritis 04/30/2022   Rash 01/15/2022   External hemorrhoid 09/15/2021   Generalized abdominal pain 08/30/2021   Ventral hernia 08/16/2021   Allergic conjunctivitis 07/28/2021   Grade II internal hemorrhoids    H. pylori infection 11/08/2020   Gastritis and gastroduodenitis    Lung nodule 10/26/2020   Ventral hernia without obstruction or gangrene 10/04/2020   Chronic diarrhea 09/16/2020   Abnormal Pap smear of cervix 08/18/2020   Hyperlipidemia associated with type 2 diabetes mellitus (HCC) 06/21/2020   Right hip pain 06/21/2020   Orthopnea 01/04/2020   Chronic cough 12/23/2019   Dyspepsia 05/22/2019   Essential hypertension 04/07/2019   Vaginal bleeding 01/27/2019   Low back pain 01/27/2019  BMI 50.0-59.9, adult (HCC) 12/05/2017   Diastolic dysfunction with heart failure (HCC) 10/25/2017   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   Abscess 10/25/2017   Diabetic nephropathy associated with type 2 diabetes mellitus (HCC) 10/25/2017   Type 2 diabetes mellitus with complication (HCC) 09/13/2017   Sleep apnea 08/16/2017   Chronic pain of right knee 07/13/2016   Morbid obesity (HCC) 02/04/2015   Menorrhagia with regular cycle 11/18/2014    PCP: Lincoln Brigham, MD  REFERRING PROVIDER: Nestor Ramp, MD   REFERRING DIAG: M17.9 (ICD-10-CM) - Osteoarthritis of knee, unspecified laterality, unspecified osteoarthritis type   THERAPY DIAG:  Chronic pain of right knee -  Plan: PT plan of care cert/re-cert  Pain in right hip - Plan: PT plan of care cert/re-cert  Chronic pain of left knee - Plan: PT plan of care cert/re-cert  Other abnormalities of gait and mobility - Plan: PT plan of care cert/re-cert  Muscle weakness (generalized) - Plan: PT plan of care cert/re-cert  Localized edema - Plan: PT plan of care cert/re-cert  Rationale for Evaluation and Treatment: Rehabilitation  ONSET DATE: Chronic  SUBJECTIVE:   SUBJECTIVE STATEMENT: Pt presents to PT with reports of severe bilateral knee pain and discomfort. No change since last PT episode, made worse with walking and stairs. Is trying to lose weight, would like to have TKA in future.   PERTINENT HISTORY: DM II, HTN  PAIN:  Are you having pain?  Yes: NPRS scale: 5/10 Worst: 8/10 Pain location: bilateral knees,  Pain description: sharp, achy Aggravating factors: walking, stairs,  Relieving factors: medication, rest, injections  PRECAUTIONS: None  RED FLAGS: None   WEIGHT BEARING RESTRICTIONS: No  FALLS:  Has patient fallen in last 6 months? No  LIVING ENVIRONMENT: Lives with: lives with their family Lives in: House/apartment Stairs: Yes: External: 3 steps; none Has following equipment at home: None  OCCUPATION: Not currently working  PLOF: Independent  PATIENT GOALS: decrease knee pain, improve comfort with walking, lose weight  NEXT MD VISIT: 10/22/22  OBJECTIVE:   DIAGNOSTIC FINDINGS: See imaging   PATIENT SURVEYS:  FOTO: 39% function; 54% predicted   COGNITION: Overall cognitive status: Within functional limits for tasks assessed     SENSATION: WFL  POSTURE: genu valgum and large body habitus  PALPATION: Patellar hypomobility, TTP to distal quad  LOWER EXTREMITY ROM:  Active ROM Right eval Left eval  Knee flexion 92 95  Knee extension 7 4   (Blank rows = not tested)  LOWER EXTREMITY MMT:  MMT Right eval Left eval  Hip flexion 3+/5 3+/5  Hip  extension    Hip abduction 3+/5 3+/5  Hip adduction    Hip internal rotation    Hip external rotation    Knee flexion 3+/5 3+/5  Knee extension 3+/5 3+/5  Ankle dorsiflexion    Ankle plantarflexion    Ankle inversion    Ankle eversion     (Blank rows = not tested)  LOWER EXTREMITY SPECIAL TESTS:  DNT  FUNCTIONAL TESTS:  30 Second Sit to Stand: 7 reps with UE  GAIT: Distance walked: 34ft Assistive device utilized: None Level of assistance: Complete Independence Comments: antalgic gait, wide BoS   TREATMENT: [facility] Adult PT Treatment:                                                DATE: 10/16/2022 Therapeutic  Exercise: Quad sets x 5 - 5 sec hold  SLR x 5  Seated clamshell x 10 GTB LAQ x 5 each  PATIENT EDUCATION:  Education details: eval findings, FOTO, HEP, POC Person educated: Patient Education method: Explanation, Demonstration, and Handouts Education comprehension: verbalized understanding and returned demonstration  HOME EXERCISE PROGRAM: Access Code: [email or web address] URL: [email or web address] Date: 10/16/2022 Prepared by: Edwinna Areola  Exercises - Supine Quadricep Sets  - 1 x daily - 7 x weekly - 2 sets - 10 reps - 5 sec hold - Active Straight Leg Raise with Quad Set  - 1 x daily - 7 x weekly - 2 sets - 10 reps - Seated Hip Abduction with Resistance  - 1 x daily - 7 x weekly - 3 sets - 10 reps - green band hold - Seated Long Arc Quad  - 1 x daily - 7 x weekly - 3 sets - 10 reps  ASSESSMENT:  CLINICAL IMPRESSION: Patient is a 57 y.o. F who was seen today for physical therapy evaluation and treatment for chronic bilateral knee pian. Physical findings are consistent with MD impression as pt demonstrates sharp decrease in bilateral knee strength and ROM. FOTO score demonstrates decrease in subjective functional ability below PLOF. Pt would benefit from skilled PT services working on improving quad and proximal knee strength in order to decrease knee pain and  improve comfort/   OBJECTIVE IMPAIRMENTS: Abnormal gait, decreased mobility, difficulty walking, decreased ROM, decreased strength, and pain.   ACTIVITY LIMITATIONS: standing, squatting, stairs, transfers, and locomotion level  PARTICIPATION LIMITATIONS: driving, shopping, community activity, and yard work  PERSONAL FACTORS: Fitness and 1-2 comorbidities: DM II, HTN  are also affecting patient's functional outcome.   REHAB POTENTIAL: Good  CLINICAL DECISION MAKING: Evolving/moderate complexity  EVALUATION COMPLEXITY: Moderate   GOALS: Goals reviewed with patient? No  SHORT TERM GOALS: Target date: 11/06/2022   Pt will be compliant and knowledgeable with initial HEP for improved comfort and carryover Baseline: initial HEP given  Goal status: INITIAL  2.  Pt will self report bilateral knee pain no greater than 6/10 for improved comfort and functional ability Baseline: 8/10 at worst Goal status: INITIAL   LONG TERM GOALS: Target date: 12/11/2022   Pt will improve FOTO function score to no less than 54% as proxy for functional improvement Baseline: 39% function Goal status: INITIAL  2.  Pt will self report bilateral knee pain no greater than 3/10 for improved comfort and functional ability Baseline: 8/10 at worst Goal status: INITIAL   3.  Pt will increase 30 Second Sit to Stand rep count to no less than 9 reps for improved balance, strength, and functional mobility Baseline: 7 reps with UE Goal status: INITIAL   4.  Pt will improve bilateral knee AROM to no less than 3-110 degrees for improved comfort and functional mobility with stairs and other community navigatoin Baseline: see ROM chart Goal status: INITIAL   PLAN:  PT FREQUENCY: 2x/week  PT DURATION: 8 weeks  PLANNED INTERVENTIONS: Therapeutic exercises, Therapeutic activity, Neuromuscular re-education, Balance training, Gait training, Patient/Family education, Self Care, Joint mobilization, Aquatic Therapy, Dry  Needling, Electrical stimulation, Cryotherapy, Moist heat, Vasopneumatic device, Manual therapy, and Re-evaluation  PLAN FOR NEXT SESSION: assess HEP response, progress quad and proximal hip strength  Check all possible CPT codes: 88416 - PT Re-evaluation, 97110- Therapeutic Exercise, [phone]- Neuro Re-education, [phone] - Gait Training, 97140 - Manual Therapy, 97530 - Therapeutic Activities, 97535 - Self Care, and 97016 - Vaso  Check all conditions that are expected to impact treatment: {Conditions expected to impact treatment:Diabetes mellitus and Musculoskeletal disorders   If treatment provided at initial evaluation, no treatment charged due to lack of authorization.       Eloy End, PT 10/17/2022, 8:16 AM

## 2022-10-15 NOTE — Telephone Encounter (Signed)
Patient no longer covered by [employer].  Patients commercial coverage is [employer]. Medication covered with copay of $4.  No further action needed.

## 2022-10-16 ENCOUNTER — Other Ambulatory Visit: Payer: Self-pay

## 2022-10-16 ENCOUNTER — Ambulatory Visit: Payer: Medicaid Other | Attending: Family Medicine

## 2022-10-16 DIAGNOSIS — M179 Osteoarthritis of knee, unspecified: Secondary | ICD-10-CM | POA: Insufficient documentation

## 2022-10-16 DIAGNOSIS — M25561 Pain in right knee: Secondary | ICD-10-CM | POA: Insufficient documentation

## 2022-10-16 DIAGNOSIS — M6281 Muscle weakness (generalized): Secondary | ICD-10-CM | POA: Diagnosis not present

## 2022-10-16 DIAGNOSIS — G8929 Other chronic pain: Secondary | ICD-10-CM | POA: Diagnosis not present

## 2022-10-16 DIAGNOSIS — R2689 Other abnormalities of gait and mobility: Secondary | ICD-10-CM | POA: Diagnosis not present

## 2022-10-16 DIAGNOSIS — M25562 Pain in left knee: Secondary | ICD-10-CM | POA: Diagnosis not present

## 2022-10-16 DIAGNOSIS — R600 Localized edema: Secondary | ICD-10-CM | POA: Diagnosis not present

## 2022-10-16 DIAGNOSIS — M25551 Pain in right hip: Secondary | ICD-10-CM | POA: Insufficient documentation

## 2022-10-21 NOTE — Progress Notes (Addendum)
    SUBJECTIVE:   CHIEF COMPLAINT / HPI:   DM is a 57 yo F w/ hx of T2DM, asthma, OA of BL knees, obesity, and chronic abm pain that p/f T2DM f/u.  T2DM - Was unable to get Ozempic due to it being on back order.  - Has resumed Metformin and Jardiance - Is eating less sweets  Asthma - Feeling more SOB with heat and requesting refill of Dulera.  - Had previously stopped taking Dulera, but feels like she needs it again   OBJECTIVE:   BP 103/74   Pulse (!) 127   Ht 5\' 4"  (1.626 m)   Wt (!) 325 lb (147.4 kg)   LMP 04/05/2016   SpO2 98%   BMI 55.79 kg/m   General: Alert, pleasant woman. NAD. HEENT: NCAT. MMM. CV: RRR, no murmurs.  Resp: CTAB, no wheezing or crackles. Normal WOB on RA.  Abm: Soft, nontender, nondistended. BS present. Ext: Moves all ext spontaneously Skin: Warm, well perfused   ASSESSMENT/PLAN:   Type 2 diabetes mellitus with complication (HCC) A1c above goal today.  Has been unable to take Ozempic due to it being on backorder.  He is taking metformin and Jardiance.  Reviewed patient's insurance formulary, they should cover Trulicity; plan to switch to Trulicity from Ozempic due to backorder.  - Start Trulicity 0.75mg  weekly x4 wks, then increase to 1.5mg  weekly if tolerating - Stop Ozempic. Has been unable to get due to it being out of stock. - Cont Metformin and Jardiance - Discussed potential need to start insulin in future. Pt prefers to avoid this and wants to hold off for now.   Asthma Previously on Dulera for asthma, but had stopped taking it.  However patient feels like her asthma is worsening again due to recent heat.  She wants to restart Dulera. Lung exam benign today. -Start Dulera 2 puffs daily.  Can also take 6 additional puffs a day for as needed shortness of breath.  Chronic osteoarthritis Recently got bilateral steroid knee injections for osteoarthritis.  Patient will likely need surgery in future, however is currently a poor surgical  candidate due to obesity and uncontrolled diabetes. She was also given meloxicam. I advised her to report this to her GI as it may exacerbated her GI symptoms and abdominal pain, given H pylori infxn. - Pt would benefit from rollator walker to help with gait instability from chronic OA.   R/u Recs - In 2 months, consider further increasing trulicity if pt tolerating  Lincoln Brigham, MD [facility]

## 2022-10-22 ENCOUNTER — Ambulatory Visit (INDEPENDENT_AMBULATORY_CARE_PROVIDER_SITE_OTHER): Payer: Medicaid Other | Admitting: Family Medicine

## 2022-10-22 ENCOUNTER — Encounter: Payer: Self-pay | Admitting: Family Medicine

## 2022-10-22 ENCOUNTER — Other Ambulatory Visit: Payer: Self-pay

## 2022-10-22 VITALS — BP 103/74 | HR 127 | Ht 64.0 in | Wt 325.0 lb

## 2022-10-22 DIAGNOSIS — A048 Other specified bacterial intestinal infections: Secondary | ICD-10-CM

## 2022-10-22 DIAGNOSIS — E118 Type 2 diabetes mellitus with unspecified complications: Secondary | ICD-10-CM | POA: Diagnosis not present

## 2022-10-22 DIAGNOSIS — R1033 Periumbilical pain: Secondary | ICD-10-CM | POA: Diagnosis not present

## 2022-10-22 DIAGNOSIS — J4520 Mild intermittent asthma, uncomplicated: Secondary | ICD-10-CM

## 2022-10-22 DIAGNOSIS — M1990 Unspecified osteoarthritis, unspecified site: Secondary | ICD-10-CM

## 2022-10-22 DIAGNOSIS — J45909 Unspecified asthma, uncomplicated: Secondary | ICD-10-CM | POA: Insufficient documentation

## 2022-10-22 LAB — POCT GLYCOSYLATED HEMOGLOBIN (HGB A1C): HbA1c, POC (controlled diabetic range): 9.4 % — AB (ref 0.0–7.0)

## 2022-10-22 MED ORDER — TRULICITY 0.75 MG/0.5ML ~~LOC~~ SOAJ
0.7500 mg | SUBCUTANEOUS | 1 refills | Status: DC
Start: 2022-10-22 — End: 2022-11-20

## 2022-10-22 MED ORDER — TRAMADOL HCL 50 MG PO TABS
50.0000 mg | ORAL_TABLET | Freq: Two times a day (BID) | ORAL | 0 refills | Status: DC | PRN
Start: 2022-10-22 — End: 2022-11-26

## 2022-10-22 MED ORDER — DULERA 100-5 MCG/ACT IN AERO
2.0000 | INHALATION_SPRAY | Freq: Every day | RESPIRATORY_TRACT | 3 refills | Status: AC
Start: 2022-10-22 — End: ?

## 2022-10-22 MED ORDER — OMEPRAZOLE 20 MG PO CPDR
20.0000 mg | DELAYED_RELEASE_CAPSULE | Freq: Every day | ORAL | 3 refills | Status: DC
Start: 2022-10-22 — End: 2022-10-22

## 2022-10-22 MED ORDER — OMEPRAZOLE 20 MG PO CPDR
20.0000 mg | DELAYED_RELEASE_CAPSULE | Freq: Every day | ORAL | 3 refills | Status: DC
Start: 2022-10-22 — End: 2022-11-29

## 2022-10-22 NOTE — Assessment & Plan Note (Signed)
Recently got bilateral steroid knee injections for osteoarthritis.  Patient will likely need surgery in future, however is currently a poor surgical candidate due to obesity and uncontrolled diabetes. She was also given meloxicam. I advised her to report this to her GI as it may exacerbated her GI symptoms and abdominal pain, given H pylori infxn.

## 2022-10-22 NOTE — Assessment & Plan Note (Signed)
Previously on [facility] for asthma, but had stopped taking it.  However patient feels like her asthma is worsening again due to recent heat.  She wants to restart Dulera. Lung exam benign today. -Start Dulera 2 puffs daily.  Can also take 6 additional puffs a day for as needed shortness of breath.

## 2022-10-22 NOTE — Patient Instructions (Signed)
Good to see you today - Thank you for coming in  Things we discussed today:  1) For your diabetes - Your A1c is 9.4 today - Keep taking the Metformin and Jardiance - I switched your Ozempic to Trulicity. Take .75 mg once a week. Take this for 4 weeks, and if you are tolerating it well, then you can double the dose and take 1.5mg  once a week.  2) Make an appointment for your next mammogram  3) For your asthma - Take Dulera 2 puffs once a day - If you are feeling short of breath, you can take up to 6 additional puffs a day for asthma  Please always bring your medication bottles  Come back to see me in 2 months and we can see how the Trulicity is working for you.

## 2022-10-22 NOTE — Assessment & Plan Note (Signed)
A1c above goal today.  Has been unable to take Ozempic due to it being on backorder.  He is taking metformin and Jardiance.  Reviewed patient's insurance formulary, they should cover Trulicity; plan to switch to Trulicity from Ozempic due to backorder.  - Start Trulicity 0.75mg  weekly x4 wks, then increase to 1.5mg  weekly if tolerating - Stop Ozempic. Has been unable to get due to it being out of stock. - Cont Metformin and Jardiance - Discussed potential need to start insulin in future. Pt prefers to avoid this and wants to hold off for now.

## 2022-10-23 ENCOUNTER — Other Ambulatory Visit (INDEPENDENT_AMBULATORY_CARE_PROVIDER_SITE_OTHER): Payer: Medicaid Other

## 2022-10-23 ENCOUNTER — Encounter: Payer: Self-pay | Admitting: Gastroenterology

## 2022-10-23 ENCOUNTER — Ambulatory Visit: Payer: Medicaid Other | Admitting: Gastroenterology

## 2022-10-23 VITALS — BP 130/80 | HR 70 | Ht 64.0 in | Wt 325.0 lb

## 2022-10-23 DIAGNOSIS — Z6841 Body Mass Index (BMI) 40.0 and over, adult: Secondary | ICD-10-CM

## 2022-10-23 DIAGNOSIS — K2970 Gastritis, unspecified, without bleeding: Secondary | ICD-10-CM

## 2022-10-23 DIAGNOSIS — R1013 Epigastric pain: Secondary | ICD-10-CM

## 2022-10-23 DIAGNOSIS — B9681 Helicobacter pylori [H. pylori] as the cause of diseases classified elsewhere: Secondary | ICD-10-CM

## 2022-10-23 NOTE — Patient Instructions (Addendum)
You have been scheduled for an endoscopy. Please follow written instructions given to you at your visit today.  If you use inhalers (even only as needed), please bring them with you on the day of your procedure.  If you take any of the following medications, they will need to be adjusted prior to your procedure:   DO NOT TAKE 7 DAYS PRIOR TO TEST- Trulicity (dulaglutide) Ozempic, Wegovy (semaglutide) Mounjaro (tirzepatide) Bydureon Bcise (exanatide extended release)  DO NOT TAKE 1 DAY PRIOR TO YOUR TEST Rybelsus (semaglutide) Adlyxin (lixisenatide) Victoza (liraglutide) Byetta (exanatide)  _______________________________________________________  If your blood pressure at your visit was 140/90 or greater, please contact your primary care physician to follow up on this.  _______________________________________________________  If you are age 44 or older, your body mass index should be between 23-30. Your Body mass index is 55.79 kg/m. If this is out of the aforementioned range listed, please consider follow up with your Primary Care Provider.  If you are age 54 or younger, your body mass index should be between 19-25. Your Body mass index is 55.79 kg/m. If this is out of the aformentioned range listed, please consider follow up with your Primary Care Provider.   ________________________________________________________  The [facility] providers would like to encourage you to use [facility] to communicate with providers for non-urgent requests or questions.  Due to long hold times on the telephone, sending your provider a message by [facility] may be a faster and more efficient way to get a response.  Please allow 48 business hours for a response.  Please remember that this is for non-urgent requests.  _______________________________________________________
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
## 2022-10-24 ENCOUNTER — Telehealth: Payer: Self-pay | Admitting: Gastroenterology

## 2022-10-24 NOTE — Telephone Encounter (Signed)
Patient returned your call regarding labs. Requesting a call back to discuss. Please advise.

## 2022-10-24 NOTE — Telephone Encounter (Signed)
See lab result note 10/23/22 for additional details.

## 2022-10-25 ENCOUNTER — Ambulatory Visit: Payer: Medicaid Other | Admitting: Pharmacist

## 2022-10-30 ENCOUNTER — Telehealth: Payer: Self-pay | Admitting: Gastroenterology

## 2022-10-30 ENCOUNTER — Ambulatory Visit: Payer: Medicaid Other | Admitting: Pharmacist

## 2022-10-31 ENCOUNTER — Ambulatory Visit: Payer: Medicaid Other | Attending: Family Medicine

## 2022-10-31 DIAGNOSIS — G8929 Other chronic pain: Secondary | ICD-10-CM | POA: Insufficient documentation

## 2022-10-31 DIAGNOSIS — M25562 Pain in left knee: Secondary | ICD-10-CM | POA: Insufficient documentation

## 2022-10-31 DIAGNOSIS — M6281 Muscle weakness (generalized): Secondary | ICD-10-CM | POA: Insufficient documentation

## 2022-10-31 DIAGNOSIS — M25551 Pain in right hip: Secondary | ICD-10-CM | POA: Insufficient documentation

## 2022-10-31 DIAGNOSIS — M25561 Pain in right knee: Secondary | ICD-10-CM | POA: Diagnosis not present

## 2022-10-31 DIAGNOSIS — R600 Localized edema: Secondary | ICD-10-CM | POA: Insufficient documentation

## 2022-10-31 DIAGNOSIS — R2689 Other abnormalities of gait and mobility: Secondary | ICD-10-CM | POA: Insufficient documentation

## 2022-10-31 NOTE — Telephone Encounter (Signed)
Patient states that she recently came to see our office and c/o diarrhea. Was told to use metamucil every day. Patient states she has been taking metamucil daily x 1 week and has seen no improvement in her symptoms. She notes that her stools are not watery, just loose and frequent. Office note suggests that should patient's symptoms not improve with daily metamucil, she should add miralax. I have advised patient that she should begin miralax 17 grams dissolved in at least 8 ounces water/juice daily. She may titrate this medication as needed based on bowel movements. Patient verbalizes understanding and states she will try this.

## 2022-10-31 NOTE — Patient Instructions (Signed)
Aquatic Therapy at [facility]-  What to Expect!  Where:   [facility] [address] [facility] [address] [place], [place] [address] Rehab phone [phone]  NOTE:  You will receive an automated phone message reminding you of your appt and it will say the appointment is at the [address] [facility] [address] [facility] clinic.          How to Prepare: Please make sure you drink 8 ounces of water about one hour prior to your pool session A caregiver may attend if needed with the patient to help assist as needed. A caregiver can sit in the pool room on chair. Please arrive IN YOUR SUIT and 15 minutes prior to your appointment - this helps to avoid delays in starting your session. Please make sure to attend to any toileting needs prior to entering the pool Locker rooms for changing are provided.   There is direct access to the pool deck form the locker room.  You can lock your belongings in a locker with lock provided. Once on the pool deck your therapist will ask if you have signed the Patient  Consent and Assignment of Benefits form before beginning treatment Your therapist may take your blood pressure prior to, during and after your session if indicated We usually try and create a home exercise program based on activities we do in the pool.  Please be thinking about who might be able to assist you in the pool should you need to participate in an aquatic home exercise program at the time of discharge if you need assistance.  Some patients do not want to or do not have the ability to participate in an aquatic home program - this is not a barrier in any way to you participating in aquatic therapy as part of your current therapy plan! After Discharge from PT, you can continue using home program at  the [facility]/, there is a drop-in fee for $5 ($45 a month)or for 60 years  or older $4.00 ($40 a month for seniors ) or any local [facility] pool.  Memberships for purchase are  available for gym/pool at [facility]  IT IS VERY IMPORTANT THAT YOUR LAST VISIT BE IN THE CLINIC AT [address] AFTER YOUR LAST AQUATIC VISIT.  PLEASE MAKE SURE THAT YOU HAVE A [address]  APPOINTMENT SCHEDULED.   About the pool: Pool is located approximately 500 FT from the entrance of the building.  Please bring a support person if you need assistance traveling this      distance.   Your therapist will assist you in entering the water; there are two ways to           enter: stairs with railings, and a mechanical lift. Your therapist will determine the most appropriate way for you.  Water temperature is usually between 88-90 degrees  There may be up to 2 other swimmers in the pool at the same time  The pool deck is tile, please wear shoes with good traction if you prefer not to be barefoot.    Contact Info:  For appointment scheduling and cancellations:         Please call the [facility]  PH:[phone]              Aquatic Therapy  [facility]       All sessions are 45 minutes                                                    

## 2022-10-31 NOTE — Therapy (Signed)
OUTPATIENT PHYSICAL THERAPY TREATMENT NOTE   Patient Name: Rita Bass MRN: [id number] DOB:10-Dec-1965, 57 y.o., female Today's Date: 10/31/2022  END OF SESSION:  PT End of Session - 10/31/22 1615     Visit Number 2    Number of Visits 17    Date for PT Re-Evaluation 12/11/22    Authorization Type [employer]    Authorization Time Period 9 vists 8/5-10/3    Authorization - Visit Number 1    Authorization - Number of Visits 9    PT Start Time 1615    PT Stop Time 1655    PT Time Calculation (min) 40 min    Activity Tolerance Patient limited by pain    Behavior During Therapy WFL for tasks assessed/performed             Past Medical History:  Diagnosis Date   Arthritis    Asthma    Chronic pain of left knee    Complication of anesthesia    PONV   Diabetes (HCC)    GERD (gastroesophageal reflux disease)    H/O bronchitis    Hepatic steatosis    Internal hemorrhoids    Kidney stones    Obesity    Renal disorder    Shortness of breath dyspnea    Sleep apnea    past issues - at weight over 400lbs   Sleep apnea in adult    Polysomnogram pending.  Followed by Dr. Craige Cotta   Past Surgical History:  Procedure Laterality Date   BIOPSY  11/01/2020   Procedure: BIOPSY;  Surgeon: Shellia Cleverly, DO;  Location: [facility];  Service: Gastroenterology;;   BIOPSY  05/11/2021   Procedure: BIOPSY;  Surgeon: Shellia Cleverly, DO;  Location: [facility];  Service: Gastroenterology;;   CESAREAN SECTION     6 c-sections   CHOLECYSTECTOMY     COLONOSCOPY WITH PROPOFOL N/A 05/11/2021   Procedure: COLONOSCOPY WITH PROPOFOL;  Surgeon: Shellia Cleverly, DO;  Location: [facility];  Service: Gastroenterology;  Laterality: N/A;   ESOPHAGOGASTRODUODENOSCOPY (EGD) WITH PROPOFOL N/A 11/01/2020   Procedure: ESOPHAGOGASTRODUODENOSCOPY (EGD) WITH PROPOFOL;  Surgeon: Shellia Cleverly, DO;  Location: [facility];  Service: Gastroenterology;  Laterality: N/A;   HERNIA  REPAIR     KIDNEY STONE SURGERY     OOPHORECTOMY     TRANSTHORACIC ECHOCARDIOGRAM  09/2017   Technically difficult study.  Did not use Definity contrast.  EF was 55 to 60% with moderate LVH and grade 1 diastolic dysfunction.  No significant valvular lesions noted.  No regional wall motion normality but difficult to assess due to poor imaging.    TUBAL LIGATION     UPPER GASTROINTESTINAL ENDOSCOPY     Patient Active Problem List   Diagnosis Date Noted   Asthma 10/22/2022   Primary osteoarthritis of both knees 10/03/2022   Wound check, abscess 07/21/2022   Type 2 diabetes mellitus with diabetic neuropathy, unspecified (HCC) 04/30/2022   Chronic osteoarthritis 04/30/2022   Rash 01/15/2022   External hemorrhoid 09/15/2021   Generalized abdominal pain 08/30/2021   Ventral hernia 08/16/2021   Allergic conjunctivitis 07/28/2021   Grade II internal hemorrhoids    H. pylori infection 11/08/2020   Gastritis and gastroduodenitis    Lung nodule 10/26/2020   Ventral hernia without obstruction or gangrene 10/04/2020   Chronic diarrhea 09/16/2020   Abnormal Pap smear of cervix 08/18/2020   Hyperlipidemia associated with type 2 diabetes mellitus (HCC) 06/21/2020   Right hip pain 06/21/2020  Orthopnea 01/04/2020   Chronic cough 12/23/2019   Dyspepsia 05/22/2019   Essential hypertension 04/07/2019   Vaginal bleeding 01/27/2019   Low back pain 01/27/2019   BMI 50.0-59.9, adult (HCC) 12/05/2017   Diastolic dysfunction with heart failure (HCC) 10/25/2017   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   Abscess 10/25/2017   Diabetic nephropathy associated with type 2 diabetes mellitus (HCC) 10/25/2017   Type 2 diabetes mellitus with complication (HCC) 09/13/2017   Sleep apnea 08/16/2017   Chronic pain of right knee 07/13/2016   Morbid obesity (HCC) 02/04/2015   Menorrhagia with regular cycle 11/18/2014    PCP: Lincoln Brigham, MD  REFERRING PROVIDER: Nestor Ramp, MD   REFERRING DIAG: M17.9 (ICD-10-CM) -  Osteoarthritis of knee, unspecified laterality, unspecified osteoarthritis type   THERAPY DIAG:  Chronic pain of right knee  Pain in right hip  Chronic pain of left knee  Other abnormalities of gait and mobility  Muscle weakness (generalized)  Localized edema  Rationale for Evaluation and Treatment: Rehabilitation  ONSET DATE: Chronic  SUBJECTIVE:   SUBJECTIVE STATEMENT: Patient reports continued pain especially in BIL knees.   PERTINENT HISTORY: DM II, HTN  PAIN:  Are you having pain?  Yes: NPRS scale: 6/10 Worst: 8/10 Pain location: bilateral knees,  Pain description: sharp, achy Aggravating factors: walking, stairs,  Relieving factors: medication, rest, injections  PRECAUTIONS: None  RED FLAGS: None   WEIGHT BEARING RESTRICTIONS: No  FALLS:  Has patient fallen in last 6 months? No  LIVING ENVIRONMENT: Lives with: lives with their family Lives in: House/apartment Stairs: Yes: External: 3 steps; none Has following equipment at home: None  OCCUPATION: Not currently working  PLOF: Independent  PATIENT GOALS: decrease knee pain, improve comfort with walking, lose weight  NEXT MD VISIT: 10/22/22  OBJECTIVE:   DIAGNOSTIC FINDINGS: See imaging   PATIENT SURVEYS:  FOTO: 39% function; 54% predicted   COGNITION: Overall cognitive status: Within functional limits for tasks assessed     SENSATION: WFL  POSTURE: genu valgum and large body habitus  PALPATION: Patellar hypomobility, TTP to distal quad  LOWER EXTREMITY ROM:  Active ROM Right eval Left eval  Knee flexion 92 95  Knee extension 7 4   (Blank rows = not tested)  LOWER EXTREMITY MMT:  MMT Right eval Left eval  Hip flexion 3+/5 3+/5  Hip extension    Hip abduction 3+/5 3+/5  Hip adduction    Hip internal rotation    Hip external rotation    Knee flexion 3+/5 3+/5  Knee extension 3+/5 3+/5  Ankle dorsiflexion    Ankle plantarflexion    Ankle inversion    Ankle eversion      (Blank rows = not tested)  LOWER EXTREMITY SPECIAL TESTS:  DNT  FUNCTIONAL TESTS:  30 Second Sit to Stand: 7 reps with UE  GAIT: Distance walked: 22ft Assistive device utilized: None Level of assistance: Complete Independence Comments: antalgic gait, wide BoS   TREATMENT: [facility] Adult PT Treatment:                                                DATE: 10/31/22 Therapeutic Exercise: Nustep level 5 x 5 mins LE only LAQ 2x10 2.5#  Seated marching 2.5# 2x30" Seated clamshell GTB 2x15   [facility] Adult PT Treatment:  DATE: 10/16/2022 Therapeutic Exercise: Quad sets x 5 - 5 sec hold  SLR x 5  Seated clamshell x 10 GTB LAQ x 5 each  PATIENT EDUCATION:  Education details: eval findings, FOTO, HEP, POC Person educated: Patient Education method: Explanation, Demonstration, and Handouts Education comprehension: verbalized understanding and returned demonstration  HOME EXERCISE PROGRAM: Access Code: [email or web address] URL: [email or web address] Date: 10/16/2022 Prepared by: Edwinna Areola  Exercises - Supine Quadricep Sets  - 1 x daily - 7 x weekly - 2 sets - 10 reps - 5 sec hold - Active Straight Leg Raise with Quad Set  - 1 x daily - 7 x weekly - 2 sets - 10 reps - Seated Hip Abduction with Resistance  - 1 x daily - 7 x weekly - 3 sets - 10 reps - green band hold - Seated Long Arc Quad  - 1 x daily - 7 x weekly - 3 sets - 10 reps  ASSESSMENT:  CLINICAL IMPRESSION: Patient presents to PT reporting continued BIL knee pain and that her hip is not bothering her much today compared to the knees. Session today focused on mat based LE strengthening within patients pain tolerance. She is interested in beginning aquatic PT. Patient continues to benefit from skilled PT services and should be progressed as able to improve functional independence.    OBJECTIVE IMPAIRMENTS: Abnormal gait, decreased mobility, difficulty walking, decreased ROM,  decreased strength, and pain.   ACTIVITY LIMITATIONS: standing, squatting, stairs, transfers, and locomotion level  PARTICIPATION LIMITATIONS: driving, shopping, community activity, and yard work  PERSONAL FACTORS: Fitness and 1-2 comorbidities: DM II, HTN  are also affecting patient's functional outcome.   REHAB POTENTIAL: Good  CLINICAL DECISION MAKING: Evolving/moderate complexity  EVALUATION COMPLEXITY: Moderate   GOALS: Goals reviewed with patient? No  SHORT TERM GOALS: Target date: 11/06/2022   Pt will be compliant and knowledgeable with initial HEP for improved comfort and carryover Baseline: initial HEP given  Goal status: INITIAL  2.  Pt will self report bilateral knee pain no greater than 6/10 for improved comfort and functional ability Baseline: 8/10 at worst Goal status: INITIAL   LONG TERM GOALS: Target date: 12/11/2022   Pt will improve FOTO function score to no less than 54% as proxy for functional improvement Baseline: 39% function Goal status: INITIAL  2.  Pt will self report bilateral knee pain no greater than 3/10 for improved comfort and functional ability Baseline: 8/10 at worst Goal status: INITIAL   3.  Pt will increase 30 Second Sit to Stand rep count to no less than 9 reps for improved balance, strength, and functional mobility Baseline: 7 reps with UE Goal status: INITIAL   4.  Pt will improve bilateral knee AROM to no less than 3-110 degrees for improved comfort and functional mobility with stairs and other community navigatoin Baseline: see ROM chart Goal status: INITIAL   PLAN:  PT FREQUENCY: 2x/week  PT DURATION: 8 weeks  PLANNED INTERVENTIONS: Therapeutic exercises, Therapeutic activity, Neuromuscular re-education, Balance training, Gait training, Patient/Family education, Self Care, Joint mobilization, Aquatic Therapy, Dry Needling, Electrical stimulation, Cryotherapy, Moist heat, Vasopneumatic device, Manual therapy, and  Re-evaluation  PLAN FOR NEXT SESSION: assess HEP response, progress quad and proximal hip strength  Check all possible CPT codes: 56213 - PT Re-evaluation, 97110- Therapeutic Exercise, [phone]- Neuro Re-education, [phone] - Gait Training, 97140 - Manual Therapy, 97530 - Therapeutic Activities, 97535 - Self Care, and 97016 - Vaso    Check all conditions that are expected to impact  treatment: {Conditions expected to impact treatment:Diabetes mellitus and Musculoskeletal disorders   If treatment provided at initial evaluation, no treatment charged due to lack of authorization.       Berta Minor, PTA 10/31/2022, 4:58 PM

## 2022-11-01 ENCOUNTER — Ambulatory Visit: Payer: Medicaid Other | Admitting: Pharmacist

## 2022-11-12 ENCOUNTER — Ambulatory Visit: Payer: Medicaid Other | Admitting: Pharmacist

## 2022-11-12 ENCOUNTER — Ambulatory Visit: Payer: Medicaid Other

## 2022-11-14 ENCOUNTER — Ambulatory Visit: Payer: Medicaid Other

## 2022-11-15 ENCOUNTER — Ambulatory Visit: Payer: Medicaid Other | Admitting: Physical Therapy

## 2022-11-20 ENCOUNTER — Ambulatory Visit (INDEPENDENT_AMBULATORY_CARE_PROVIDER_SITE_OTHER): Payer: Medicaid Other | Admitting: Pharmacist

## 2022-11-20 ENCOUNTER — Encounter: Payer: Self-pay | Admitting: Pharmacist

## 2022-11-20 ENCOUNTER — Ambulatory Visit: Payer: Medicaid Other

## 2022-11-20 VITALS — BP 155/87 | HR 78 | Wt 327.8 lb

## 2022-11-20 DIAGNOSIS — E118 Type 2 diabetes mellitus with unspecified complications: Secondary | ICD-10-CM | POA: Diagnosis not present

## 2022-11-20 DIAGNOSIS — I10 Essential (primary) hypertension: Secondary | ICD-10-CM

## 2022-11-20 DIAGNOSIS — E1121 Type 2 diabetes mellitus with diabetic nephropathy: Secondary | ICD-10-CM

## 2022-11-20 LAB — POCT GLYCOSYLATED HEMOGLOBIN (HGB A1C): HbA1c, POC (controlled diabetic range): 9.2 % — AB (ref 0.0–7.0)

## 2022-11-20 MED ORDER — LISINOPRIL 10 MG PO TABS: 10.0000 mg | ORAL_TABLET | Freq: Every day | ORAL | 9 refills | Status: DC

## 2022-11-20 MED ORDER — SEMAGLUTIDE (1 MG/DOSE) 4 MG/3ML ~~LOC~~ SOPN
1.0000 mg | PEN_INJECTOR | SUBCUTANEOUS | 0 refills | Status: DC
Start: 2022-11-20 — End: 2023-01-16

## 2022-11-20 NOTE — Patient Instructions (Addendum)
It was nice to see you today!  Your goal blood sugar is 80-130 before eating and less than 180 after eating.  Medication Changes: Using the sample pen provided - START Ozempic (semaglutide) for two weeks take half the clicks to 1mg   (35 clicks) THEN take 1 mg for three weeks  THEN increase to 2mg    Restart Lisinopril 10mg  once daily   Discontinue - Trulicity (weekly injection)  Continue all other medication the same.   Monitor blood sugars at home and keep a log (glucometer or piece of paper) to bring with you to your next visit.  Keep up the good work with diet and exercise. Aim for a diet full of vegetables, fruit and lean meats (chicken, [place], fish). Try to limit salt intake by eating fresh or frozen vegetables (instead of canned), rinse canned vegetables prior to cooking and do not add any additional salt to meals.

## 2022-11-20 NOTE — Progress Notes (Signed)
S:     Chief Complaint  Patient presents with   Medication Management    Diabetes - HTN   57 y.o. female who presents for diabetes evaluation, education, and management. Patient arrives in good spirits and presents without any assistance. Patient is accompanied by her daughter, Rita Bass.   Patient was referred and last seen by Primary Care Provider, Dr. Sherrilee Gilles, on 10/22/2022.   PMH is significant for multiple medical problems including surgery history.  At last visit,   Patient was offered enrollment in the [employer] CGM study.  She was interested and we initiated the enrollment including A1c and DDS.  Unfortunately, phone was not able to access [place] [employer] APP.  Patient is currently considering a new phone.   Current diabetes medications include: Trulicity (dulaglutide) 0.75 weekly, Metformin 1000mg  BID and Jardaince (empagliflozin) 25mg  daily.  Current hypertension medications include: None currently (prescribed lisinopril but has not taken recently).   Do you feel that your medications are working for you? No, Trulicity (dulaglutide) is not providing the same benefit as Ozempic (semaglutide) Have you been experiencing any side effects to the medications prescribed? no  Patient denies hypoglycemic events.  Reported home fasting blood sugars: elevated with fasting today> 300   Patient-reported exercise habits: limited due to knee pain, reports increase in falling episodes including ~ 2 times per week.    O:   Review of Systems  Musculoskeletal:  Positive for joint pain (right knee).  Neurological:  Positive for tingling (neuropathic pain with toe cramping).  All other systems reviewed and are negative.   Physical Exam  A1C obtained for possible enrollment in CGM - [employer] study.   Phone could not download APP.   Lab Results  Component Value Date   HGBA1C 9.2 (A) 11/20/2022   Vitals:   11/20/22 1128 11/20/22 1129  BP: (!) 145/88 (!) 155/87  Pulse: 78   SpO2: 97%      Lipid Panel     Component Value Date/Time   CHOL 192 06/21/2020 1547   TRIG 131 06/21/2020 1547   HDL 54 06/21/2020 1547   CHOLHDL 3.6 06/21/2020 1547   LDLCALC 115 (H) 06/21/2020 1547   LDLDIRECT 117 (H) 02/28/2021 0917    Clinical Atherosclerotic Cardiovascular Disease (ASCVD):  The 10-year ASCVD risk score (Arnett DK, et al., 2019) is: 7.3%   Values used to calculate the score:     Age: 17 years     Sex: Female     Is Non-Hispanic African American: No     Diabetic: Yes     Tobacco smoker: No     Systolic Blood Pressure: 155 mmHg     Is BP treated: Yes     HDL Cholesterol: 54 mg/dL     Total Cholesterol: 192 mg/dL   Patient is participating in a Managed Medicaid Plan:  Yes   A/P: Diabetes longstanding and currently with suboptimal control. Patient is able to verbalize appropriate hypoglycemia management plan. Medication adherence appears appropriate. Control is suboptimal due to lack of response to low dose Trulicity (dulaglutide).  Patient interested in returning to Ozempic (semaglutide).  - Stopped Trulicity (dulaglutide)  Started GLP-1 Ozempic (semaglutide) with use of sample pen to allow dose escalation.    Medication Samples have been provided to the patient. Drug name: Ozempic (semglutide)       Strength: 1mg  pen        Qty: 1  LOT: PAR [id number]  Exp.Date: 01/16/2025  Dosing instructions: Using the sample pen provided -  START Ozempic (semaglutide) for two weeks take half the clicks to 1mg   (35 clicks), THEN take 1 mg for three weeks, THEN increase to 2mg  with new prescription we will send to your pharmacy at next visit in ~ 4 weeks -Continued SGLT2-I Jardiance (empagliflozin) 25 mg. Counseled on sick day rules. -Continued metformin   -Extensively discussed pathophysiology of diabetes, recommended lifestyle interventions, dietary effects on blood sugar control.  -Counseled on s/sx of and management of hypoglycemia.  -Consider enrollment in [employer] study in future if  patient obtains new phone.   Hypertension longstanding and currently not controlled. Blood pressure goal of <130/80 mmHg. Medication adherence - not taking lisinopril as in the past.  -Restarted Lisinopril 10 mg daily.   *Patient requested medical supply of rolling walker with seat appropriate for her weight.  Asked for an order to be sent to her durable medical supply.  I offered to make request of her PCP, Dr. Sherrilee Gilles.   Written patient instructions provided. Patient verbalized understanding of treatment plan.  Total time in face to face counseling 57 minutes.    Follow-up:  Pharmacist 1 month PCP clinic visit in 7 weeks Patient seen with Alesia Banda, PharmD Candidate.

## 2022-11-20 NOTE — Assessment & Plan Note (Signed)
Hypertension longstanding and currently not controlled. Blood pressure goal of <130/80 mmHg. Medication adherence - not taking lisinopril as in the past.  -Restarted Lisinopril 10 mg daily.

## 2022-11-20 NOTE — Assessment & Plan Note (Addendum)
Diabetes longstanding and currently with suboptimal control. Patient is able to verbalize appropriate hypoglycemia management plan. Medication adherence appears appropriate. Control is suboptimal due to lack of response to low dose Trulicity (dulaglutide).  Patient interested in returning to Ozempic (semaglutide).  -Stopped Trulicity (dulaglutide) Started GLP-1 Ozempic (semaglutide) with use of sample pen to allow dose escalation.    Medication Samples have been provided to the patient.  Dosing instructions: Using the sample pen provided - START Ozempic (semaglutide) for two weeks take half the clicks to 1mg   (35 clicks), THEN take 1 mg for three weeks, THEN increase to 2mg  with new prescription we will send to your pharmacy at next visit in ~ 4 weeks -Continued SGLT2-I Jardiance (empagliflozin) 25 mg. Counseled on sick day rules. -Continued metformin   -Extensively discussed pathophysiology of diabetes, recommended lifestyle interventions, dietary effects on blood sugar control.  -Counseled on s/sx of and management of hypoglycemia.

## 2022-11-21 NOTE — Progress Notes (Signed)
Reviewed and agree with Dr Koval's plan.   

## 2022-11-26 ENCOUNTER — Other Ambulatory Visit: Payer: Self-pay

## 2022-11-26 DIAGNOSIS — R1033 Periumbilical pain: Secondary | ICD-10-CM

## 2022-11-26 NOTE — Therapy (Deleted)
OUTPATIENT PHYSICAL THERAPY TREATMENT NOTE   Patient Name: Rita Bass MRN: [id number] DOB:1965-04-24, 57 y.o., female Today's Date: 11/26/2022  END OF SESSION:    Past Medical History:  Diagnosis Date   Arthritis    Asthma    Chronic pain of left knee    Complication of anesthesia    PONV   Diabetes (HCC)    GERD (gastroesophageal reflux disease)    H/O bronchitis    Hepatic steatosis    Internal hemorrhoids    Kidney stones    Obesity    Renal disorder    Shortness of breath dyspnea    Sleep apnea    past issues - at weight over 400lbs   Sleep apnea in adult    Polysomnogram pending.  Followed by Dr. Craige Cotta   Past Surgical History:  Procedure Laterality Date   BIOPSY  11/01/2020   Procedure: BIOPSY;  Surgeon: Shellia Cleverly, DO;  Location: [facility];  Service: Gastroenterology;;   BIOPSY  05/11/2021   Procedure: BIOPSY;  Surgeon: Shellia Cleverly, DO;  Location: [facility];  Service: Gastroenterology;;   CESAREAN SECTION     6 c-sections   CHOLECYSTECTOMY     COLONOSCOPY WITH PROPOFOL N/A 05/11/2021   Procedure: COLONOSCOPY WITH PROPOFOL;  Surgeon: Shellia Cleverly, DO;  Location: [facility];  Service: Gastroenterology;  Laterality: N/A;   ESOPHAGOGASTRODUODENOSCOPY (EGD) WITH PROPOFOL N/A 11/01/2020   Procedure: ESOPHAGOGASTRODUODENOSCOPY (EGD) WITH PROPOFOL;  Surgeon: Shellia Cleverly, DO;  Location: [facility];  Service: Gastroenterology;  Laterality: N/A;   HERNIA REPAIR     KIDNEY STONE SURGERY     OOPHORECTOMY     TRANSTHORACIC ECHOCARDIOGRAM  09/2017   Technically difficult study.  Did not use Definity contrast.  EF was 55 to 60% with moderate LVH and grade 1 diastolic dysfunction.  No significant valvular lesions noted.  No regional wall motion normality but difficult to assess due to poor imaging.    TUBAL LIGATION     UPPER GASTROINTESTINAL ENDOSCOPY     Patient Active Problem List   Diagnosis Date Noted   Asthma 10/22/2022    Primary osteoarthritis of both knees 10/03/2022   Wound check, abscess 07/21/2022   Type 2 diabetes mellitus with diabetic neuropathy, unspecified (HCC) 04/30/2022   Chronic osteoarthritis 04/30/2022   Rash 01/15/2022   External hemorrhoid 09/15/2021   Generalized abdominal pain 08/30/2021   Ventral hernia 08/16/2021   Allergic conjunctivitis 07/28/2021   Grade II internal hemorrhoids    H. pylori infection 11/08/2020   Gastritis and gastroduodenitis    Lung nodule 10/26/2020   Ventral hernia without obstruction or gangrene 10/04/2020   Chronic diarrhea 09/16/2020   Abnormal Pap smear of cervix 08/18/2020   Hyperlipidemia associated with type 2 diabetes mellitus (HCC) 06/21/2020   Right hip pain 06/21/2020   Orthopnea 01/04/2020   Chronic cough 12/23/2019   Dyspepsia 05/22/2019   Essential hypertension 04/07/2019   Vaginal bleeding 01/27/2019   Low back pain 01/27/2019   BMI 50.0-59.9, adult (HCC) 12/05/2017   Diastolic dysfunction with heart failure (HCC) 10/25/2017   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   Abscess 10/25/2017   Diabetic nephropathy associated with type 2 diabetes mellitus (HCC) 10/25/2017   Type 2 diabetes mellitus with complication (HCC) 09/13/2017   Sleep apnea 08/16/2017   Chronic pain of right knee 07/13/2016   Morbid obesity (HCC) 02/04/2015   Menorrhagia with regular cycle 11/18/2014    PCP: Lincoln Brigham, MD  REFERRING PROVIDER: Nestor Ramp, MD  REFERRING DIAG: M17.9 (ICD-10-CM) - Osteoarthritis of knee, unspecified laterality, unspecified osteoarthritis type   THERAPY DIAG:  No diagnosis found.  Rationale for Evaluation and Treatment: Rehabilitation  ONSET DATE: Chronic  SUBJECTIVE:   SUBJECTIVE STATEMENT: *** Patient reports continued pain especially in BIL knees.   PERTINENT HISTORY: DM II, HTN  PAIN:  Are you having pain?  Yes: NPRS scale: *** 6/10 Worst: 8/10 Pain location: bilateral knees,  Pain description: sharp, achy Aggravating  factors: walking, stairs,  Relieving factors: medication, rest, injections  PRECAUTIONS: None  RED FLAGS: None   WEIGHT BEARING RESTRICTIONS: No  FALLS:  Has patient fallen in last 6 months? No  LIVING ENVIRONMENT: Lives with: lives with their family Lives in: House/apartment Stairs: Yes: External: 3 steps; none Has following equipment at home: None  OCCUPATION: Not currently working  PLOF: Independent  PATIENT GOALS: decrease knee pain, improve comfort with walking, lose weight  NEXT MD VISIT: 10/22/22  OBJECTIVE:   DIAGNOSTIC FINDINGS: See imaging   PATIENT SURVEYS:  FOTO: 39% function; 54% predicted   COGNITION: Overall cognitive status: Within functional limits for tasks assessed     SENSATION: WFL  POSTURE: genu valgum and large body habitus  PALPATION: Patellar hypomobility, TTP to distal quad  LOWER EXTREMITY ROM:  Active ROM Right eval Left eval  Knee flexion 92 95  Knee extension 7 4   (Blank rows = not tested)  LOWER EXTREMITY MMT:  MMT Right eval Left eval  Hip flexion 3+/5 3+/5  Hip extension    Hip abduction 3+/5 3+/5  Hip adduction    Hip internal rotation    Hip external rotation    Knee flexion 3+/5 3+/5  Knee extension 3+/5 3+/5  Ankle dorsiflexion    Ankle plantarflexion    Ankle inversion    Ankle eversion     (Blank rows = not tested)  LOWER EXTREMITY SPECIAL TESTS:  DNT  FUNCTIONAL TESTS:  30 Second Sit to Stand: 7 reps with UE  GAIT: Distance walked: 44ft Assistive device utilized: None Level of assistance: Complete Independence Comments: antalgic gait, wide BoS   TREATMENT: [facility] Adult PT Treatment:                                                DATE: 11/12/22 Therapeutic Exercise: Nustep level 5 x 8 mins LE only LAQ 2x10 2.5#  Seated marching 2.5# 2x30" Seated clamshell GTB 2x15 Seated heel/toe raises 2.5 2x15   [facility] Adult PT Treatment:                                                DATE:  10/31/22 Therapeutic Exercise: Nustep level 5 x 5 mins LE only LAQ 2x10 2.5#  Seated marching 2.5# 2x30" Seated clamshell GTB 2x15   [facility] Adult PT Treatment:                                                DATE: 10/16/2022 Therapeutic Exercise: Quad sets x 5 - 5 sec hold  SLR x 5  Seated clamshell x 10 GTB LAQ x 5 each  PATIENT EDUCATION:  Education details: eval findings, FOTO, HEP, POC Person educated: Patient Education method: Explanation, Demonstration, and Handouts Education comprehension: verbalized understanding and returned demonstration  HOME EXERCISE PROGRAM: Access Code: [email or web address] URL: [email or web address] Date: 10/16/2022 Prepared by: Edwinna Areola  Exercises - Supine Quadricep Sets  - 1 x daily - 7 x weekly - 2 sets - 10 reps - 5 sec hold - Active Straight Leg Raise with Quad Set  - 1 x daily - 7 x weekly - 2 sets - 10 reps - Seated Hip Abduction with Resistance  - 1 x daily - 7 x weekly - 3 sets - 10 reps - green band hold - Seated Long Arc Quad  - 1 x daily - 7 x weekly - 3 sets - 10 reps  ASSESSMENT:  CLINICAL IMPRESSION: ***  Patient presents to PT reporting continued BIL knee pain and that her hip is not bothering her much today compared to the knees. Session today focused on mat based LE strengthening within patients pain tolerance. She is interested in beginning aquatic PT. Patient continues to benefit from skilled PT services and should be progressed as able to improve functional independence.    OBJECTIVE IMPAIRMENTS: Abnormal gait, decreased mobility, difficulty walking, decreased ROM, decreased strength, and pain.   ACTIVITY LIMITATIONS: standing, squatting, stairs, transfers, and locomotion level  PARTICIPATION LIMITATIONS: driving, shopping, community activity, and yard work  PERSONAL FACTORS: Fitness and 1-2 comorbidities: DM II, HTN  are also affecting patient's functional outcome.   REHAB POTENTIAL: Good  CLINICAL DECISION  MAKING: Evolving/moderate complexity  EVALUATION COMPLEXITY: Moderate   GOALS: Goals reviewed with patient? No  SHORT TERM GOALS: Target date: 11/06/2022   Pt will be compliant and knowledgeable with initial HEP for improved comfort and carryover Baseline: initial HEP given  Goal status: INITIAL  2.  Pt will self report bilateral knee pain no greater than 6/10 for improved comfort and functional ability Baseline: 8/10 at worst Goal status: INITIAL   LONG TERM GOALS: Target date: 12/11/2022   Pt will improve FOTO function score to no less than 54% as proxy for functional improvement Baseline: 39% function Goal status: INITIAL  2.  Pt will self report bilateral knee pain no greater than 3/10 for improved comfort and functional ability Baseline: 8/10 at worst Goal status: INITIAL   3.  Pt will increase 30 Second Sit to Stand rep count to no less than 9 reps for improved balance, strength, and functional mobility Baseline: 7 reps with UE Goal status: INITIAL   4.  Pt will improve bilateral knee AROM to no less than 3-110 degrees for improved comfort and functional mobility with stairs and other community navigatoin Baseline: see ROM chart Goal status: INITIAL   PLAN:  PT FREQUENCY: 2x/week  PT DURATION: 8 weeks  PLANNED INTERVENTIONS: Therapeutic exercises, Therapeutic activity, Neuromuscular re-education, Balance training, Gait training, Patient/Family education, Self Care, Joint mobilization, Aquatic Therapy, Dry Needling, Electrical stimulation, Cryotherapy, Moist heat, Vasopneumatic device, Manual therapy, and Re-evaluation  PLAN FOR NEXT SESSION: assess HEP response, progress quad and proximal hip strength  Check all possible CPT codes: 41324 - PT Re-evaluation, 97110- Therapeutic Exercise, [phone]- Neuro Re-education, [phone] - Gait Training, [phone] - Manual Therapy, 97530 - Therapeutic Activities, [phone] - Self Care, and [phone] - Vaso    Check all conditions that are  expected to impact treatment: {Conditions expected to impact treatment:Diabetes mellitus and Musculoskeletal disorders   If treatment provided at initial evaluation, no treatment charged due to lack of  authorization.       Hildred Laser, PT 11/26/2022, 1:20 PM

## 2022-11-27 ENCOUNTER — Ambulatory Visit: Payer: Medicaid Other

## 2022-11-28 MED ORDER — TRAMADOL HCL 50 MG PO TABS
50.0000 mg | ORAL_TABLET | Freq: Two times a day (BID) | ORAL | 0 refills | Status: DC | PRN
Start: 2022-11-28 — End: 2022-12-25

## 2022-11-28 NOTE — Progress Notes (Signed)
Agree with the assessment and plan as outlined by Cira Servant, PA-C.  Plan for repeat EGD with extensive biopsies for reevaluation of known history of H. pylori gastritis with persistently positive breath testing despite multiple courses of antibiotics.  If again positive, plan for ID referral for alternative treatment options.  If still unsuccessful eradication, another consideration would be referral to [facility] for repeat upper endoscopy and specific H. pylori cultures for sensitivity (send out lab).  Otilia Kareem, DO, [facility]

## 2022-11-29 ENCOUNTER — Ambulatory Visit (HOSPITAL_COMMUNITY): Payer: Medicaid Other | Admitting: Registered Nurse

## 2022-11-29 ENCOUNTER — Other Ambulatory Visit: Payer: Self-pay

## 2022-11-29 ENCOUNTER — Encounter (HOSPITAL_COMMUNITY): Payer: Self-pay | Admitting: Gastroenterology

## 2022-11-29 ENCOUNTER — Ambulatory Visit (HOSPITAL_COMMUNITY)
Admission: RE | Admit: 2022-11-29 | Discharge: 2022-11-29 | Disposition: A | Discharge status: Home / Self Care | Payer: Medicaid Other | Attending: Gastroenterology | Admitting: Gastroenterology

## 2022-11-29 ENCOUNTER — Encounter (HOSPITAL_COMMUNITY)
Admission: RE | Disposition: A | Discharge status: Home / Self Care | Payer: Self-pay | Source: Home / Self Care | Attending: Gastroenterology

## 2022-11-29 DIAGNOSIS — I5030 Unspecified diastolic (congestive) heart failure: Secondary | ICD-10-CM | POA: Diagnosis not present

## 2022-11-29 DIAGNOSIS — K2100 Gastro-esophageal reflux disease with esophagitis, without bleeding: Secondary | ICD-10-CM | POA: Diagnosis not present

## 2022-11-29 DIAGNOSIS — J449 Chronic obstructive pulmonary disease, unspecified: Secondary | ICD-10-CM | POA: Diagnosis not present

## 2022-11-29 DIAGNOSIS — Z7984 Long term (current) use of oral hypoglycemic drugs: Secondary | ICD-10-CM | POA: Diagnosis not present

## 2022-11-29 DIAGNOSIS — R194 Change in bowel habit: Secondary | ICD-10-CM

## 2022-11-29 DIAGNOSIS — K259 Gastric ulcer, unspecified as acute or chronic, without hemorrhage or perforation: Secondary | ICD-10-CM

## 2022-11-29 DIAGNOSIS — I110 Hypertensive heart disease with heart failure: Secondary | ICD-10-CM | POA: Diagnosis not present

## 2022-11-29 DIAGNOSIS — J45909 Unspecified asthma, uncomplicated: Secondary | ICD-10-CM | POA: Diagnosis not present

## 2022-11-29 DIAGNOSIS — Z6841 Body Mass Index (BMI) 40.0 and over, adult: Secondary | ICD-10-CM | POA: Diagnosis not present

## 2022-11-29 DIAGNOSIS — R1013 Epigastric pain: Secondary | ICD-10-CM | POA: Diagnosis not present

## 2022-11-29 DIAGNOSIS — E119 Type 2 diabetes mellitus without complications: Secondary | ICD-10-CM | POA: Diagnosis not present

## 2022-11-29 DIAGNOSIS — B9681 Helicobacter pylori [H. pylori] as the cause of diseases classified elsewhere: Secondary | ICD-10-CM | POA: Diagnosis not present

## 2022-11-29 DIAGNOSIS — I10 Essential (primary) hypertension: Secondary | ICD-10-CM | POA: Diagnosis not present

## 2022-11-29 DIAGNOSIS — K2950 Unspecified chronic gastritis without bleeding: Secondary | ICD-10-CM | POA: Diagnosis not present

## 2022-11-29 DIAGNOSIS — R197 Diarrhea, unspecified: Secondary | ICD-10-CM

## 2022-11-29 HISTORY — PX: ESOPHAGOGASTRODUODENOSCOPY (EGD) WITH PROPOFOL: SHX5813

## 2022-11-29 HISTORY — PX: BIOPSY: SHX5522

## 2022-11-29 LAB — GLUCOSE, CAPILLARY: Glucose-Capillary: 212 mg/dL — ABNORMAL HIGH (ref 70–99)

## 2022-11-29 SURGERY — ESOPHAGOGASTRODUODENOSCOPY (EGD) WITH PROPOFOL
Anesthesia: Monitor Anesthesia Care

## 2022-11-29 MED ORDER — LIDOCAINE 2% (20 MG/ML) 5 ML SYRINGE
INTRAMUSCULAR | Status: DC | PRN
  Administered 2022-11-29: 100 mg via INTRAVENOUS

## 2022-11-29 MED ORDER — LACTATED RINGERS IV SOLN: INTRAVENOUS | Status: DC

## 2022-11-29 MED ORDER — GLYCOPYRROLATE PF 0.2 MG/ML IJ SOSY
PREFILLED_SYRINGE | INTRAMUSCULAR | Status: DC | PRN
  Administered 2022-11-29: .2 mg via INTRAVENOUS

## 2022-11-29 MED ORDER — PROPOFOL 10 MG/ML IV BOLUS
INTRAVENOUS | Status: DC | PRN
  Administered 2022-11-29: 30 mg via INTRAVENOUS
  Administered 2022-11-29: 50 mg via INTRAVENOUS
  Administered 2022-11-29: 30 mg via INTRAVENOUS
  Administered 2022-11-29: 50 mg via INTRAVENOUS

## 2022-11-29 MED ORDER — PANTOPRAZOLE SODIUM 40 MG PO TBEC: 40.0000 mg | DELAYED_RELEASE_TABLET | Freq: Two times a day (BID) | ORAL | 1 refills | Status: DC

## 2022-11-29 MED ORDER — PROPOFOL 500 MG/50ML IV EMUL
INTRAVENOUS | Status: DC | PRN
  Administered 2022-11-29: 150 ug/kg/min via INTRAVENOUS

## 2022-11-29 SURGICAL SUPPLY — 15 items

## 2022-11-29 NOTE — Op Note (Signed)
[facility] Patient Name: Rita Bass Procedure Date : 11/29/2022 MRN: [id number] Attending MD: Doristine Locks , MD, [id number] Date of Birth: September 18, 1965 CSN: [id number] Age: 57 Admit Type: Outpatient Procedure:                Upper GI endoscopy Indications:              Epigastric abdominal pain, Follow-up of                            Helicobacter pylori, Positive test for Helicobacter                            pylori, Diarrhea, Change in bowel habits, post                            prandial urgency Providers:                Doristine Locks, MD, Fransisca Connors, Kandice Robinsons, Technician Referring MD:              Medicines:                Monitored Anesthesia Care Complications:            No immediate complications. Estimated Blood Loss:     Estimated blood loss was minimal. Procedure:                Pre-Anesthesia Assessment:                           - Prior to the procedure, a History and Physical                            was performed, and patient medications and                            allergies were reviewed. The patient's tolerance of                            previous anesthesia was also reviewed. The risks                            and benefits of the procedure and the sedation                            options and risks were discussed with the patient.                            All questions were answered, and informed consent                            was obtained. Prior Anticoagulants: The patient has                            taken no anticoagulant  or antiplatelet agents. ASA                            Grade Assessment: III - A patient with severe                            systemic disease. After reviewing the risks and                            benefits, the patient was deemed in satisfactory                            condition to undergo the procedure.                           After obtaining  informed consent, the endoscope was                            passed under direct vision. Throughout the                            procedure, the patient's blood pressure, pulse, and                            oxygen saturations were monitored continuously. The                            [id number]-[id number] ([id number]) Olympus endoscope was introduced                            through the mouth, and advanced to the second part                            of duodenum. The upper GI endoscopy was                            accomplished without difficulty. The patient                            tolerated the procedure well. Scope In: Scope Out: Findings:      LA Grade A (one or more mucosal breaks less than 5 mm, not extending       between tops of 2 mucosal folds) esophagitis with no bleeding was found       in the lower third of the esophagus.      The upper third of the esophagus and middle third of the esophagus were       normal.      One non-bleeding cratered gastric ulcer with no stigmata of bleeding was       found in the prepyloric region of the stomach. The lesion was 6 mm in       largest dimension. Biopsies were taken with a cold forceps for       Helicobacter pylori testing. Estimated blood loss was minimal.      Diffuse mild inflammation characterized by congestion (edema) and  erythema was found in the gastric fundus, in the gastric body and in the       gastric antrum. Biopsies were taken with a cold forceps for Helicobacter       pylori testing. Estimated blood loss was minimal.      The examined duodenum was normal. Biopsies were taken with a cold       forceps for histology. Estimated blood loss was minimal. Impression:               - LA Grade A reflux esophagitis with no bleeding.                           - Normal upper third of esophagus and middle third                            of esophagus.                           - Non-bleeding gastric ulcer with no stigmata of                             bleeding. Biopsied.                           - Gastritis. Biopsied.                           - Normal examined duodenum. Biopsied. Recommendation:           - Patient has a contact number available for                            emergencies. The signs and symptoms of potential                            delayed complications were discussed with the                            patient. Return to normal activities tomorrow.                            Written discharge instructions were provided to the                            patient.                           - Resume previous diet.                           - Continue present medications.                           - Await pathology results.                           - Use Protonix (pantoprazole) 40 mg PO BID for 6  weeks, then reduce to 40 mg daily.                           - If pathology again shows Helicobacter pylori                            infection, will plan for referral to the Infectious                            Disease clinic for assistance in refractory H.                            pylori treatment.                           - Depending on pathology results, may consider SIBO                            testing vs empiric trial of rifaximin.                           - Will plan for repeat EGD to ensure appropriate                            ulcer healing. Timing will be depending on                            pathology results and treatment plan. Procedure Code(s):        --- Professional ---                           [phone], Esophagogastroduodenoscopy, flexible,                            transoral; with biopsy, single or multiple Diagnosis Code(s):        --- Professional ---                           K21.00, Gastro-esophageal reflux disease with                            esophagitis, without bleeding                           K25.9, Gastric ulcer, unspecified as acute  or                            chronic, without hemorrhage or perforation                           K29.70, Gastritis, unspecified, without bleeding                           R10.13, Epigastric pain  B96.81, Helicobacter pylori [H. pylori] as the                            cause of diseases classified elsewhere                           R19.7, Diarrhea, unspecified CPT copyright 2022 [employer]. All rights reserved. The codes documented in this report are preliminary and upon coder review may  be revised to meet current compliance requirements. Doristine Locks, MD 11/29/2022 12:29:32 PM Number of Addenda: 0
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
## 2022-11-29 NOTE — Transfer of Care (Signed)
Immediate Anesthesia Transfer of Care Note  Patient: Rita Bass  Procedure(s) Performed: ESOPHAGOGASTRODUODENOSCOPY (EGD) WITH PROPOFOL BIOPSY  Patient Location: PACU  Anesthesia Type:MAC  Level of Consciousness: awake  Airway & Oxygen Therapy: Patient Spontanous Breathing  Post-op Assessment: Report given to RN and Post -op Vital signs reviewed and stable  Post vital signs: Reviewed and stable  Last Vitals:  Vitals Value Taken Time  BP 133/87 11/29/22 1216  Temp    Pulse 100 11/29/22 1217  Resp 24 11/29/22 1217  SpO2 93 % 11/29/22 1217  Vitals shown include unfiled device data.  Last Pain:  Vitals:   11/29/22 0942  TempSrc: Temporal  PainSc: 4          Complications: No notable events documented.

## 2022-11-29 NOTE — Interval H&P Note (Signed)
History and Physical Interval Note:  11/29/2022 10:21 AM  Rita Bass  has presented today for surgery, with the diagnosis of hx H. Pylori, epigastric pain, alternating bowel habits.  The various methods of treatment have been discussed with the patient and family. After consideration of risks, benefits and other options for treatment, the patient has consented to  Procedure(s): ESOPHAGOGASTRODUODENOSCOPY (EGD) WITH PROPOFOL (N/A) as a surgical intervention.  The patient's history has been reviewed, patient examined, no change in status, stable for surgery.  I have reviewed the patient's chart and labs.  Questions were answered to the patient's satisfaction.     Verlin Dike [name]

## 2022-11-29 NOTE — Discharge Instructions (Signed)
YOU HAD AN ENDOSCOPIC PROCEDURE TODAY: Refer to the procedure report and other information in the discharge instructions given to you for any specific questions about what was found during the examination. If this information does not answer your questions, please call [place] office at [phone] to clarify.   YOU SHOULD EXPECT: Some feelings of bloating in the abdomen. Passage of more gas than usual. Walking can help get rid of the air that was put into your GI tract during the procedure and reduce the bloating. If you had a lower endoscopy (such as a colonoscopy or flexible sigmoidoscopy) you may notice spotting of blood in your stool or on the toilet paper. Some abdominal soreness may be present for a day or two, also.  DIET: Your first meal following the procedure should be a light meal and then it is ok to progress to your normal diet. A half-sandwich or bowl of soup is an example of a good first meal. Heavy or fried foods are harder to digest and may make you feel nauseous or bloated. Drink plenty of fluids but you should avoid alcoholic beverages for 24 hours. If you had a esophageal dilation, please see attached instructions for diet.    ACTIVITY: Your care partner should take you home directly after the procedure. You should plan to take it easy, moving slowly for the rest of the day. You can resume normal activity the day after the procedure however YOU SHOULD NOT DRIVE, use power tools, machinery or perform tasks that involve climbing or major physical exertion for 24 hours (because of the sedation medicines used during the test).   SYMPTOMS TO REPORT IMMEDIATELY: A gastroenterologist can be reached at any hour. Please call [phone]  for any of the following symptoms:   Following upper endoscopy (EGD, EUS, ERCP, esophageal dilation) Vomiting of blood or coffee ground material  New, significant abdominal pain  New, significant chest pain or pain under the shoulder blades  Painful or  persistently difficult swallowing  New shortness of breath  Black, tarry-looking or red, bloody stools  FOLLOW UP:  If any biopsies were taken you will be contacted by phone or by letter within the next 1-3 weeks. Call [phone]  if you have not heard about the biopsies in 3 weeks.  Please also call with any specific questions about appointments or follow up tests.  

## 2022-11-29 NOTE — Anesthesia Preprocedure Evaluation (Addendum)
Anesthesia Evaluation  Patient identified by MRN, date of birth, ID band Patient awake    Reviewed: Allergy & Precautions, NPO status , Patient's Chart, lab work & pertinent test results, reviewed documented beta blocker date and time   History of Anesthesia Complications (+) history of anesthetic complications  Airway Mallampati: IV  TM Distance: >3 FB Neck ROM: Full    Dental no notable dental hx.    Pulmonary shortness of breath, asthma , sleep apnea , neg COPD   breath sounds clear to auscultation       Cardiovascular hypertension, (-) angina + Orthopnea  (-) CAD, (-) Past MI, (-) Cardiac Stents, (-) CABG and (-) PND  Rhythm:Regular Rate:Normal     Neuro/Psych neg Seizures    GI/Hepatic ,GERD  ,,(+) neg Cirrhosis        Endo/Other  diabetes, Type 2  Morbid obesity  Renal/GU Renal disease     Musculoskeletal  (+) Arthritis ,    Abdominal   Peds  Hematology   Anesthesia Other Findings   Reproductive/Obstetrics                              Anesthesia Physical Anesthesia Plan  ASA: 3  Anesthesia Plan: MAC   Post-op Pain Management:    Induction: Intravenous  PONV Risk Score and Plan: 2 and Propofol infusion  Airway Management Planned:   Additional Equipment:   Intra-op Plan:   Post-operative Plan:   Informed Consent: I have reviewed the patients History and Physical, chart, labs and discussed the procedure including the risks, benefits and alternatives for the proposed anesthesia with the patient or authorized representative who has indicated his/her understanding and acceptance.     Dental advisory given  Plan Discussed with: CRNA  Anesthesia Plan Comments:        Anesthesia Quick Evaluation

## 2022-11-30 ENCOUNTER — Telehealth: Payer: Self-pay

## 2022-11-30 LAB — SURGICAL PATHOLOGY

## 2022-11-30 NOTE — Telephone Encounter (Signed)
Patient calls nurse line in regards to Tramadol.   She reports the medication is requiring a prior authorization.   PA submitted via covermymeds.   Key: [id number]

## 2022-11-30 NOTE — Telephone Encounter (Signed)
Pharmacy and patient have been updated.

## 2022-11-30 NOTE — Anesthesia Postprocedure Evaluation (Signed)
Anesthesia Post Note  Patient: Marquesha Casad Gum  Procedure(s) Performed: ESOPHAGOGASTRODUODENOSCOPY (EGD) WITH PROPOFOL BIOPSY     Patient location during evaluation: PACU Anesthesia Type: MAC Level of consciousness: awake and alert Pain management: pain level controlled Vital Signs Assessment: post-procedure vital signs reviewed and stable Respiratory status: spontaneous breathing, nonlabored ventilation, respiratory function stable and patient connected to nasal cannula oxygen Cardiovascular status: stable and blood pressure returned to baseline Postop Assessment: no apparent nausea or vomiting Anesthetic complications: no   No notable events documented.  Last Vitals:  Vitals:   11/29/22 1230 11/29/22 1240  BP: (!) 115/94 (!) 143/87  Pulse: 93 92  Resp: (!) 21 18  Temp:    SpO2: 94% 96%    Last Pain:  Vitals:   11/29/22 1230  TempSrc:   PainSc: 0-No pain                 Mariann Barter

## 2022-11-30 NOTE — Telephone Encounter (Signed)
/  Prior Auth for patients medication TRAMADOL approved by [employer] from 11/30/22 to 05/29/23.  CoverMyMeds Key: [id number] PA Case ID #: [id number]

## 2022-12-03 ENCOUNTER — Telehealth: Payer: Self-pay | Admitting: Family Medicine

## 2022-12-03 ENCOUNTER — Encounter (HOSPITAL_COMMUNITY): Payer: Self-pay | Admitting: Gastroenterology

## 2022-12-03 DIAGNOSIS — M1990 Unspecified osteoarthritis, unspecified site: Secondary | ICD-10-CM

## 2022-12-03 NOTE — Telephone Encounter (Signed)
Patient walked in to request a Rx for a walker (one with the seat) Terrain Rollator.   Pt. Ph # [phone]

## 2022-12-04 ENCOUNTER — Other Ambulatory Visit: Payer: Self-pay | Admitting: *Deleted

## 2022-12-04 DIAGNOSIS — B9681 Helicobacter pylori [H. pylori] as the cause of diseases classified elsewhere: Secondary | ICD-10-CM

## 2022-12-06 NOTE — Telephone Encounter (Signed)
Community message sent to [employer].

## 2022-12-11 NOTE — Therapy (Deleted)
OUTPATIENT PHYSICAL THERAPY TREATMENT NOTE   Patient Name: Rita Bass MRN: [id number] DOB:[birth year], 57 y.o., female Today's Date: 12/11/2022  END OF SESSION:    Past Medical History:  Diagnosis Date   Arthritis    Asthma    Chronic pain of left knee    Complication of anesthesia    PONV   Diabetes (HCC)    GERD (gastroesophageal reflux disease)    H/O bronchitis    Hepatic steatosis    Internal hemorrhoids    Kidney stones    Obesity    Renal disorder    Shortness of breath dyspnea    Sleep apnea    past issues - at weight over 400lbs   Sleep apnea in adult    Polysomnogram pending.  Followed by Dr. Craige Cotta   Past Surgical History:  Procedure Laterality Date   BIOPSY  11/01/2020   Procedure: BIOPSY;  Surgeon: Shellia Cleverly, DO;  Location: [facility];  Service: Gastroenterology;;   BIOPSY  05/11/2021   Procedure: BIOPSY;  Surgeon: Shellia Cleverly, DO;  Location: [facility];  Service: Gastroenterology;;   BIOPSY  11/29/2022   Procedure: BIOPSY;  Surgeon: Shellia Cleverly, DO;  Location: [facility];  Service: Gastroenterology;;   CESAREAN SECTION     6 c-sections   CHOLECYSTECTOMY     COLONOSCOPY WITH PROPOFOL N/A 05/11/2021   Procedure: COLONOSCOPY WITH PROPOFOL;  Surgeon: Shellia Cleverly, DO;  Location: [facility];  Service: Gastroenterology;  Laterality: N/A;   ESOPHAGOGASTRODUODENOSCOPY (EGD) WITH PROPOFOL N/A 11/01/2020   Procedure: ESOPHAGOGASTRODUODENOSCOPY (EGD) WITH PROPOFOL;  Surgeon: Shellia Cleverly, DO;  Location: [facility];  Service: Gastroenterology;  Laterality: N/A;   ESOPHAGOGASTRODUODENOSCOPY (EGD) WITH PROPOFOL N/A 11/29/2022   Procedure: ESOPHAGOGASTRODUODENOSCOPY (EGD) WITH PROPOFOL;  Surgeon: Shellia Cleverly, DO;  Location: [facility];  Service: Gastroenterology;  Laterality: N/A;   HERNIA REPAIR     KIDNEY STONE SURGERY     OOPHORECTOMY     TRANSTHORACIC ECHOCARDIOGRAM  09/2017   Technically difficult  study.  Did not use Definity contrast.  EF was 55 to 60% with moderate LVH and grade 1 diastolic dysfunction.  No significant valvular lesions noted.  No regional wall motion normality but difficult to assess due to poor imaging.    TUBAL LIGATION     UPPER GASTROINTESTINAL ENDOSCOPY     Patient Active Problem List   Diagnosis Date Noted   Chronic Helicobacter pylori gastritis 11/29/2022   Gastric ulcer without hemorrhage or perforation 11/29/2022   Gastroesophageal reflux disease with esophagitis without hemorrhage 11/29/2022   Change in bowel habits 11/29/2022   Diarrhea 11/29/2022   Asthma 10/22/2022   Primary osteoarthritis of both knees 10/03/2022   Wound check, abscess 07/21/2022   Type 2 diabetes mellitus with diabetic neuropathy, unspecified (HCC) 04/30/2022   Chronic osteoarthritis 04/30/2022   Rash 01/15/2022   External hemorrhoid 09/15/2021   Generalized abdominal pain 08/30/2021   Ventral hernia 08/16/2021   Allergic conjunctivitis 07/28/2021   Grade II internal hemorrhoids    H. pylori infection 11/08/2020   Gastritis and gastroduodenitis    Lung nodule 10/26/2020   Ventral hernia without obstruction or gangrene 10/04/2020   Chronic diarrhea 09/16/2020   Abnormal Pap smear of cervix 08/18/2020   Hyperlipidemia associated with type 2 diabetes mellitus (HCC) 06/21/2020   Right hip pain 06/21/2020   Orthopnea 01/04/2020   Chronic cough 12/23/2019   Dyspepsia 05/22/2019   Essential hypertension 04/07/2019   Vaginal bleeding 01/27/2019   Low back  pain 01/27/2019   BMI 50.0-59.9, adult (HCC) 12/05/2017   Diastolic dysfunction with heart failure (HCC) 10/25/2017   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   Abscess 10/25/2017   Diabetic nephropathy associated with type 2 diabetes mellitus (HCC) 10/25/2017   Type 2 diabetes mellitus with complication (HCC) 09/13/2017   Sleep apnea 08/16/2017   Chronic pain of right knee 07/13/2016   Morbid obesity (HCC) 02/04/2015   Menorrhagia  with regular cycle 11/18/2014    PCP: Lincoln Brigham, MD  REFERRING PROVIDER: Nestor Ramp, MD   REFERRING DIAG: M17.9 (ICD-10-CM) - Osteoarthritis of knee, unspecified laterality, unspecified osteoarthritis type   THERAPY DIAG:  No diagnosis found.  Rationale for Evaluation and Treatment: Rehabilitation  ONSET DATE: Chronic  SUBJECTIVE:   SUBJECTIVE STATEMENT: *** Patient reports continued pain especially in BIL knees.   PERTINENT HISTORY: DM II, HTN  PAIN:  Are you having pain?  Yes: NPRS scale: *** 6/10 Worst: 8/10 Pain location: bilateral knees,  Pain description: sharp, achy Aggravating factors: walking, stairs,  Relieving factors: medication, rest, injections  PRECAUTIONS: None  RED FLAGS: None   WEIGHT BEARING RESTRICTIONS: No  FALLS:  Has patient fallen in last 6 months? No  LIVING ENVIRONMENT: Lives with: lives with their family Lives in: House/apartment Stairs: Yes: External: 3 steps; none Has following equipment at home: None  OCCUPATION: Not currently working  PLOF: Independent  PATIENT GOALS: decrease knee pain, improve comfort with walking, lose weight  NEXT MD VISIT: 10/22/22  OBJECTIVE:   DIAGNOSTIC FINDINGS: See imaging   PATIENT SURVEYS:  FOTO: 39% function; 54% predicted   COGNITION: Overall cognitive status: Within functional limits for tasks assessed     SENSATION: WFL  POSTURE: genu valgum and large body habitus  PALPATION: Patellar hypomobility, TTP to distal quad  LOWER EXTREMITY ROM:  Active ROM Right eval Left eval  Knee flexion 92 95  Knee extension 7 4   (Blank rows = not tested)  LOWER EXTREMITY MMT:  MMT Right eval Left eval  Hip flexion 3+/5 3+/5  Hip extension    Hip abduction 3+/5 3+/5  Hip adduction    Hip internal rotation    Hip external rotation    Knee flexion 3+/5 3+/5  Knee extension 3+/5 3+/5  Ankle dorsiflexion    Ankle plantarflexion    Ankle inversion    Ankle eversion      (Blank rows = not tested)  LOWER EXTREMITY SPECIAL TESTS:  DNT  FUNCTIONAL TESTS:  30 Second Sit to Stand: 7 reps with UE  GAIT: Distance walked: 76ft Assistive device utilized: None Level of assistance: Complete Independence Comments: antalgic gait, wide BoS   TREATMENT: [facility] Adult PT Treatment:                                                DATE: 11/12/22 Therapeutic Exercise: Nustep level 5 x 8 mins LE only LAQ 2x10 2.5#  Seated marching 2.5# 2x30" Seated clamshell GTB 2x15 Seated heel/toe raises 2.5 2x15   [facility] Adult PT Treatment:                                                DATE: 10/31/22 Therapeutic Exercise: Nustep level 5 x 5 mins LE only LAQ  2x10 2.5#  Seated marching 2.5# 2x30" Seated clamshell GTB 2x15   [facility] Adult PT Treatment:                                                DATE: 10/16/2022 Therapeutic Exercise: Quad sets x 5 - 5 sec hold  SLR x 5  Seated clamshell x 10 GTB LAQ x 5 each  PATIENT EDUCATION:  Education details: eval findings, FOTO, HEP, POC Person educated: Patient Education method: Explanation, Demonstration, and Handouts Education comprehension: verbalized understanding and returned demonstration  HOME EXERCISE PROGRAM: Access Code: [email or web address] URL: [email or web address] Date: 10/16/2022 Prepared by: Edwinna Areola  Exercises - Supine Quadricep Sets  - 1 x daily - 7 x weekly - 2 sets - 10 reps - 5 sec hold - Active Straight Leg Raise with Quad Set  - 1 x daily - 7 x weekly - 2 sets - 10 reps - Seated Hip Abduction with Resistance  - 1 x daily - 7 x weekly - 3 sets - 10 reps - green band hold - Seated Long Arc Quad  - 1 x daily - 7 x weekly - 3 sets - 10 reps  ASSESSMENT:  CLINICAL IMPRESSION: ***  Patient presents to PT reporting continued BIL knee pain and that her hip is not bothering her much today compared to the knees. Session today focused on mat based LE strengthening within patients pain tolerance. She is  interested in beginning aquatic PT. Patient continues to benefit from skilled PT services and should be progressed as able to improve functional independence.    OBJECTIVE IMPAIRMENTS: Abnormal gait, decreased mobility, difficulty walking, decreased ROM, decreased strength, and pain.   ACTIVITY LIMITATIONS: standing, squatting, stairs, transfers, and locomotion level  PARTICIPATION LIMITATIONS: driving, shopping, community activity, and yard work  PERSONAL FACTORS: Fitness and 1-2 comorbidities: DM II, HTN  are also affecting patient's functional outcome.   REHAB POTENTIAL: Good  CLINICAL DECISION MAKING: Evolving/moderate complexity  EVALUATION COMPLEXITY: Moderate   GOALS: Goals reviewed with patient? No  SHORT TERM GOALS: Target date: 11/06/2022   Pt will be compliant and knowledgeable with initial HEP for improved comfort and carryover Baseline: initial HEP given  Goal status: INITIAL  2.  Pt will self report bilateral knee pain no greater than 6/10 for improved comfort and functional ability Baseline: 8/10 at worst Goal status: INITIAL   LONG TERM GOALS: Target date: 12/11/2022   Pt will improve FOTO function score to no less than 54% as proxy for functional improvement Baseline: 39% function Goal status: INITIAL  2.  Pt will self report bilateral knee pain no greater than 3/10 for improved comfort and functional ability Baseline: 8/10 at worst Goal status: INITIAL   3.  Pt will increase 30 Second Sit to Stand rep count to no less than 9 reps for improved balance, strength, and functional mobility Baseline: 7 reps with UE Goal status: INITIAL   4.  Pt will improve bilateral knee AROM to no less than 3-110 degrees for improved comfort and functional mobility with stairs and other community navigatoin Baseline: see ROM chart Goal status: INITIAL   PLAN:  PT FREQUENCY: 2x/week  PT DURATION: 8 weeks  PLANNED INTERVENTIONS: Therapeutic exercises, Therapeutic  activity, Neuromuscular re-education, Balance training, Gait training, Patient/Family education, Self Care, Joint mobilization, Aquatic Therapy, Dry Needling, Electrical stimulation, Cryotherapy, Moist heat, Vasopneumatic  device, Manual therapy, and Re-evaluation  PLAN FOR NEXT SESSION: assess HEP response, progress quad and proximal hip strength  Check all possible CPT codes: 16109 - PT Re-evaluation, 97110- Therapeutic Exercise, [phone]- Neuro Re-education, [phone] - Gait Training, [phone] - Manual Therapy, [phone] - Therapeutic Activities, [phone] - Self Care, and [phone] - Vaso    Check all conditions that are expected to impact treatment: {Conditions expected to impact treatment:Diabetes mellitus and Musculoskeletal disorders   If treatment provided at initial evaluation, no treatment charged due to lack of authorization.       Hildred Laser, PT 12/11/2022, 12:11 PM

## 2022-12-13 ENCOUNTER — Ambulatory Visit: Payer: Medicaid Other

## 2022-12-14 ENCOUNTER — Ambulatory Visit: Payer: Medicaid Other | Admitting: Infectious Diseases

## 2022-12-14 ENCOUNTER — Other Ambulatory Visit (HOSPITAL_COMMUNITY): Payer: Self-pay

## 2022-12-14 ENCOUNTER — Telehealth: Payer: Self-pay

## 2022-12-14 NOTE — Telephone Encounter (Signed)
[facility] Patient Advocate Encounter  Insurance verification completed.    The patient is insured through [facility]. Patient has Medicare and is not eligible for a copay card, but may be able to apply for patient assistance, if available.    We will continue to follow to see if copay assistance is needed.  This test claim was processed through [facility]- copay amounts may vary at other pharmacies due to pharmacy/plan contracts, or as the patient moves through the different stages of their insurance plan.

## 2022-12-17 NOTE — Therapy (Unsigned)
OUTPATIENT PHYSICAL THERAPY TREATMENT NOTE/RECERTIFICATION   Patient Name: Rita Bass MRN: [id number] DOB:Dec 06, 1965, 57 y.o., female Today's Date: 12/20/2022  END OF SESSION:  PT End of Session - 12/20/22 1047     Visit Number 3    Number of Visits 9    Date for PT Re-Evaluation 02/19/23    Authorization Type [employer]    Authorization Time Period 9 vists 8/5-10/3    Authorization - Number of Visits 9    PT Start Time 1046    PT Stop Time 1130    PT Time Calculation (min) 44 min    Activity Tolerance Patient limited by pain    Behavior During Therapy WFL for tasks assessed/performed              Past Medical History:  Diagnosis Date   Arthritis    Asthma    Chronic pain of left knee    Complication of anesthesia    PONV   Diabetes (HCC)    GERD (gastroesophageal reflux disease)    H/O bronchitis    Hepatic steatosis    Internal hemorrhoids    Kidney stones    Obesity    Renal disorder    Shortness of breath dyspnea    Sleep apnea    past issues - at weight over 400lbs   Sleep apnea in adult    Polysomnogram pending.  Followed by Dr. Craige Cotta   Past Surgical History:  Procedure Laterality Date   BIOPSY  11/01/2020   Procedure: BIOPSY;  Surgeon: Shellia Cleverly, DO;  Location: [facility];  Service: Gastroenterology;;   BIOPSY  05/11/2021   Procedure: BIOPSY;  Surgeon: Shellia Cleverly, DO;  Location: [facility];  Service: Gastroenterology;;   BIOPSY  11/29/2022   Procedure: BIOPSY;  Surgeon: Shellia Cleverly, DO;  Location: [facility];  Service: Gastroenterology;;   CESAREAN SECTION     6 c-sections   CHOLECYSTECTOMY     COLONOSCOPY WITH PROPOFOL N/A 05/11/2021   Procedure: COLONOSCOPY WITH PROPOFOL;  Surgeon: Shellia Cleverly, DO;  Location: [facility];  Service: Gastroenterology;  Laterality: N/A;   ESOPHAGOGASTRODUODENOSCOPY (EGD) WITH PROPOFOL N/A 11/01/2020   Procedure: ESOPHAGOGASTRODUODENOSCOPY (EGD) WITH PROPOFOL;   Surgeon: Shellia Cleverly, DO;  Location: [facility];  Service: Gastroenterology;  Laterality: N/A;   ESOPHAGOGASTRODUODENOSCOPY (EGD) WITH PROPOFOL N/A 11/29/2022   Procedure: ESOPHAGOGASTRODUODENOSCOPY (EGD) WITH PROPOFOL;  Surgeon: Shellia Cleverly, DO;  Location: [facility];  Service: Gastroenterology;  Laterality: N/A;   HERNIA REPAIR     KIDNEY STONE SURGERY     OOPHORECTOMY     TRANSTHORACIC ECHOCARDIOGRAM  09/2017   Technically difficult study.  Did not use Definity contrast.  EF was 55 to 60% with moderate LVH and grade 1 diastolic dysfunction.  No significant valvular lesions noted.  No regional wall motion normality but difficult to assess due to poor imaging.    TUBAL LIGATION     UPPER GASTROINTESTINAL ENDOSCOPY     Patient Active Problem List   Diagnosis Date Noted   Chronic Helicobacter pylori gastritis 11/29/2022   Gastric ulcer without hemorrhage or perforation 11/29/2022   Gastroesophageal reflux disease with esophagitis without hemorrhage 11/29/2022   Change in bowel habits 11/29/2022   Diarrhea 11/29/2022   Asthma 10/22/2022   Primary osteoarthritis of both knees 10/03/2022   Wound check, abscess 07/21/2022   Type 2 diabetes mellitus with diabetic neuropathy, unspecified (HCC) 04/30/2022   Chronic osteoarthritis 04/30/2022   Rash 01/15/2022   External  hemorrhoid 09/15/2021   Generalized abdominal pain 08/30/2021   Ventral hernia 08/16/2021   Allergic conjunctivitis 07/28/2021   Grade II internal hemorrhoids    H. pylori infection 11/08/2020   Gastritis and gastroduodenitis    Lung nodule 10/26/2020   Ventral hernia without obstruction or gangrene 10/04/2020   Chronic diarrhea 09/16/2020   Abnormal Pap smear of cervix 08/18/2020   Hyperlipidemia associated with type 2 diabetes mellitus (HCC) 06/21/2020   Right hip pain 06/21/2020   Orthopnea 01/04/2020   Chronic cough 12/23/2019   Dyspepsia 05/22/2019   Essential hypertension 04/07/2019   Vaginal  bleeding 01/27/2019   Low back pain 01/27/2019   BMI 50.0-59.9, adult (HCC) 12/05/2017   Diastolic dysfunction with heart failure (HCC) 10/25/2017   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   Abscess 10/25/2017   Diabetic nephropathy associated with type 2 diabetes mellitus (HCC) 10/25/2017   Type 2 diabetes mellitus with complication (HCC) 09/13/2017   Sleep apnea 08/16/2017   Chronic pain of right knee 07/13/2016   Morbid obesity (HCC) 02/04/2015   Menorrhagia with regular cycle 11/18/2014    PCP: Lincoln Brigham, MD  REFERRING PROVIDER: Nestor Ramp, MD   REFERRING DIAG: M17.9 (ICD-10-CM) - Osteoarthritis of knee, unspecified laterality, unspecified osteoarthritis type   THERAPY DIAG:  Chronic pain of right knee  Chronic pain of left knee  Pain in right hip  Other abnormalities of gait and mobility  Rationale for Evaluation and Treatment: Rehabilitation  ONSET DATE: Chronic  SUBJECTIVE:   SUBJECTIVE STATEMENT: Patient reports continued pain especially in BIL knees. 6/10 in intensity.  Has difficulty with bending a straightening B knees.  PERTINENT HISTORY: DM II, HTN  PAIN:  Are you having pain?  Yes: NPRS scale: 6/10 Worst: 8/10 Pain location: bilateral knees,  Pain description: sharp, achy Aggravating factors: walking, stairs,  Relieving factors: medication, rest, injections  PRECAUTIONS: None  RED FLAGS: None   WEIGHT BEARING RESTRICTIONS: No  FALLS:  Has patient fallen in last 6 months? No  LIVING ENVIRONMENT: Lives with: lives with their family Lives in: House/apartment Stairs: Yes: External: 3 steps; none Has following equipment at home: None  OCCUPATION: Not currently working  PLOF: Independent  PATIENT GOALS: decrease knee pain, improve comfort with walking, lose weight  NEXT MD VISIT: 10/22/22  OBJECTIVE:   DIAGNOSTIC FINDINGS: See imaging   PATIENT SURVEYS:  FOTO: 39% function; 54% predicted  12/20/22 42%  COGNITION: Overall cognitive  status: Within functional limits for tasks assessed     SENSATION: WFL  POSTURE: genu valgum and large body habitus  PALPATION: Patellar hypomobility, TTP to distal quad  LOWER EXTREMITY ROM:  Active ROM Right eval Left eval R 12/20/22 L 12/20/22  Knee flexion 92 95 94d 116d  Knee extension 7 4 -4d 0d   (Blank rows = not tested)  LOWER EXTREMITY MMT:  MMT Right eval Left eval  Hip flexion 3+/5 3+/5  Hip extension    Hip abduction 3+/5 3+/5  Hip adduction    Hip internal rotation    Hip external rotation    Knee flexion 3+/5 3+/5  Knee extension 3+/5 3+/5  Ankle dorsiflexion    Ankle plantarflexion    Ankle inversion    Ankle eversion     (Blank rows = not tested)  LOWER EXTREMITY SPECIAL TESTS:  DNT  FUNCTIONAL TESTS:  30 Second Sit to Stand: 7 reps with UE 10//3/24 13 stands UE assist from lap  GAIT: Distance walked: 61ft Assistive device utilized: None Level of assistance: Complete Independence  Comments: antalgic gait, wide BoS   TREATMENT: [facility] Adult PT Treatment:                                                DATE: 12/20/22 Therapeutic Exercise: 10x STS FAQs 10/10 SLR 10/10 Supine hip fallouts GTB 10x B, 10/10 unilaterally S/L clams GTB 10/10 SAQs 10/10    [facility] Adult PT Treatment:                                                DATE: 10/31/22 Therapeutic Exercise: Nustep level 5 x 5 mins LE only LAQ 2x10 2.5#  Seated marching 2.5# 2x30" Seated clamshell GTB 2x15   [facility] Adult PT Treatment:                                                DATE: 10/16/2022 Therapeutic Exercise: Quad sets x 5 - 5 sec hold  SLR x 5  Seated clamshell x 10 GTB LAQ x 5 each  PATIENT EDUCATION:  Education details: eval findings, FOTO, HEP, POC Person educated: Patient Education method: Explanation, Demonstration, and Handouts Education comprehension: verbalized understanding and returned demonstration  HOME EXERCISE PROGRAM: Access Code: [email or web address] URL:  [email or web address] Date: 12/20/2022 Prepared by: Gustavus Bryant  Exercises - Active Straight Leg Raise with Quad Set  - 1 x daily - 7 x weekly - 2 sets - 10 reps - Seated Long Arc Quad  - 1 x daily - 7 x weekly - 3 sets - 10 reps - Hooklying Single Leg Bent Knee Fallouts with Resistance  - 1 x daily - 7 x weekly - 3 sets - 10 reps - Supine Knee Extension Strengthening  - 1 x daily - 7 x weekly - 3 sets - 10 reps  ASSESSMENT:  CLINICAL IMPRESSION: Returns to PT for only 3rd session since eval in 09/2022.  Pain levels unchanged but ROM and 30s chair stand score have improved.  Strength remains unchanged due to pain levels.  FOTO score indicates mild perceived increased function. Recommend continue OPPT to establish home based program due chronicity and pathology.  Patient presents to PT reporting continued BIL knee pain and that her hip is not bothering her much today compared to the knees. Session today focused on mat based LE strengthening within patients pain tolerance. She is interested in beginning aquatic PT. Patient continues to benefit from skilled PT services and should be progressed as able to improve functional independence.    OBJECTIVE IMPAIRMENTS: Abnormal gait, decreased mobility, difficulty walking, decreased ROM, decreased strength, and pain.   ACTIVITY LIMITATIONS: standing, squatting, stairs, transfers, and locomotion level  PARTICIPATION LIMITATIONS: driving, shopping, community activity, and yard work  PERSONAL FACTORS: Fitness and 1-2 comorbidities: DM II, HTN  are also affecting patient's functional outcome.   REHAB POTENTIAL: Good  CLINICAL DECISION MAKING: Evolving/moderate complexity  EVALUATION COMPLEXITY: Moderate   GOALS: Goals reviewed with patient? No  SHORT TERM GOALS: Target date: 11/06/2022   Pt will be compliant and knowledgeable with initial HEP for improved comfort and carryover Baseline: initial HEP given  Goal status:  Partially  met  2.  Pt will self report bilateral knee pain no greater than 6/10 for improved comfort and functional ability Baseline: 8/10 at worst; 12/20/22 6/10 Goal status: Met  LONG TERM GOALS: Target date: 12/11/2022   Pt will improve FOTO function score to no less than 54% as proxy for functional improvement Baseline: 39% function; 12/19/22 41% Goal status: INITIAL  2.  Pt will self report bilateral knee pain no greater than 3/10 for improved comfort and functional ability Baseline: 8/10 at worst Goal status: INITIAL   3.  Pt will increase 30 Second Sit to Stand rep count to no less than 9 reps for improved balance, strength, and functional mobility Baseline: 7 reps with UE; 12/20/22 13 reps Goal status: Met  4.  Pt will improve bilateral knee AROM to no less than 3-110 degrees for improved comfort and functional mobility with stairs and other community navigatoin Baseline: see ROM chart Goal status: INITIAL   PLAN:  PT FREQUENCY: 2x/week  PT DURATION: 8 weeks  PLANNED INTERVENTIONS: Therapeutic exercises, Therapeutic activity, Neuromuscular re-education, Balance training, Gait training, Patient/Family education, Self Care, Joint mobilization, Aquatic Therapy, Dry Needling, Electrical stimulation, Cryotherapy, Moist heat, Vasopneumatic device, Manual therapy, and Re-evaluation  PLAN FOR NEXT SESSION: assess HEP response, progress quad and proximal hip strength  Check all possible CPT codes: 65784 - PT Re-evaluation, 97110- Therapeutic Exercise, [phone]- Neuro Re-education, [phone] - Gait Training, [phone] - Manual Therapy, 97530 - Therapeutic Activities, [phone] - Self Care, and [phone] - Vaso    Check all conditions that are expected to impact treatment: {Conditions expected to impact treatment:Diabetes mellitus and Musculoskeletal disorders   If treatment provided at initial evaluation, no treatment charged due to lack of authorization.       Hildred Laser, PT 12/20/2022, 11:57  AM

## 2022-12-18 DIAGNOSIS — G4733 Obstructive sleep apnea (adult) (pediatric): Secondary | ICD-10-CM | POA: Diagnosis not present

## 2022-12-20 ENCOUNTER — Ambulatory Visit: Payer: Medicaid Other | Attending: Family Medicine

## 2022-12-20 DIAGNOSIS — M25561 Pain in right knee: Secondary | ICD-10-CM | POA: Diagnosis not present

## 2022-12-20 DIAGNOSIS — R2689 Other abnormalities of gait and mobility: Secondary | ICD-10-CM | POA: Diagnosis not present

## 2022-12-20 DIAGNOSIS — M25551 Pain in right hip: Secondary | ICD-10-CM | POA: Insufficient documentation

## 2022-12-20 DIAGNOSIS — M25562 Pain in left knee: Secondary | ICD-10-CM | POA: Insufficient documentation

## 2022-12-20 DIAGNOSIS — G8929 Other chronic pain: Secondary | ICD-10-CM | POA: Insufficient documentation

## 2022-12-21 ENCOUNTER — Other Ambulatory Visit: Payer: Self-pay

## 2022-12-21 ENCOUNTER — Encounter: Payer: Self-pay | Admitting: Infectious Diseases

## 2022-12-21 ENCOUNTER — Ambulatory Visit (INDEPENDENT_AMBULATORY_CARE_PROVIDER_SITE_OTHER): Payer: Medicaid Other | Admitting: Infectious Diseases

## 2022-12-21 VITALS — BP 139/86 | HR 78 | Temp 97.6°F | Ht 64.0 in | Wt 323.0 lb

## 2022-12-21 DIAGNOSIS — G8929 Other chronic pain: Secondary | ICD-10-CM | POA: Diagnosis not present

## 2022-12-21 DIAGNOSIS — R109 Unspecified abdominal pain: Secondary | ICD-10-CM

## 2022-12-21 DIAGNOSIS — A048 Other specified bacterial intestinal infections: Secondary | ICD-10-CM

## 2022-12-21 DIAGNOSIS — K2950 Unspecified chronic gastritis without bleeding: Secondary | ICD-10-CM

## 2022-12-21 DIAGNOSIS — Z79899 Other long term (current) drug therapy: Secondary | ICD-10-CM | POA: Diagnosis not present

## 2022-12-21 DIAGNOSIS — B9681 Helicobacter pylori [H. pylori] as the cause of diseases classified elsewhere: Secondary | ICD-10-CM

## 2022-12-21 MED ORDER — TALICIA 250-12.5-10 MG PO CPDR: 4.0000 | DELAYED_RELEASE_CAPSULE | Freq: Three times a day (TID) | ORAL | 0 refills | Status: DC

## 2022-12-21 NOTE — Progress Notes (Addendum)
Patient Active Problem List   Diagnosis Date Noted  . Chronic Helicobacter pylori gastritis 11/29/2022  . Gastric ulcer without hemorrhage or perforation 11/29/2022  . Gastroesophageal reflux disease with esophagitis without hemorrhage 11/29/2022  . Change in bowel habits 11/29/2022  . Diarrhea 11/29/2022  . Asthma 10/22/2022  . Primary osteoarthritis of both knees 10/03/2022  . Wound check, abscess 07/21/2022  . Type 2 diabetes mellitus with diabetic neuropathy, unspecified (HCC) 04/30/2022  . Chronic osteoarthritis 04/30/2022  . Rash 01/15/2022  . External hemorrhoid 09/15/2021  . Generalized abdominal pain 08/30/2021  . Ventral hernia 08/16/2021  . Allergic conjunctivitis 07/28/2021  . Grade II internal hemorrhoids   . H. pylori infection 11/08/2020  . Gastritis and gastroduodenitis   . Lung nodule 10/26/2020  . Ventral hernia without obstruction or gangrene 10/04/2020  . Chronic diarrhea 09/16/2020  . Abnormal Pap smear of cervix 08/18/2020  . Hyperlipidemia associated with type 2 diabetes mellitus (HCC) 06/21/2020  . Right hip pain 06/21/2020  . Orthopnea 01/04/2020  . Chronic cough 12/23/2019  . Dyspepsia 05/22/2019  . Essential hypertension 04/07/2019  . Vaginal bleeding 01/27/2019  . Low back pain 01/27/2019  . BMI 50.0-59.9, adult (HCC) 12/05/2017  . Diastolic dysfunction with heart failure (HCC) 10/25/2017  . 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 Abscess 10/25/2017  . Diabetic nephropathy associated with type 2 diabetes mellitus (HCC) 10/25/2017  . Type 2 diabetes mellitus with complication (HCC) 09/13/2017  . Sleep apnea 08/16/2017  . Chronic pain of right knee 07/13/2016  . Morbid obesity (HCC) 02/04/2015  . Menorrhagia with regular cycle 11/18/2014    Patient's Medications  New Prescriptions   No medications on file  Previous Medications   ACCU-CHEK SOFTCLIX LANCETS LANCETS    Use as instructed   ACETAMINOPHEN (TYLENOL) 650 MG CR TABLET    Take 650-1,300  mg by mouth every 8 (eight) hours as needed for pain. Last dose (2) around 0930 am.   ALBUTEROL (PROVENTIL) (5 MG/ML) 0.5% NEBULIZER SOLUTION    Take 0.5 mLs (2.5 mg total) by nebulization every 6 (six) hours as needed for wheezing or shortness of breath.   BINAXNOW COVID-19 AG HOME TEST KIT    TEST AS DIRECTED TODAY   CETIRIZINE (ZYRTEC) 10 MG TABLET    Take 1 tablet (10 mg total) by mouth daily.   DOXYCYCLINE (VIBRAMYCIN) 100 MG CAPSULE    Take 1 capsule (100 mg total) by mouth 2 (two) times daily.   DULOXETINE (CYMBALTA) 20 MG CAPSULE    Take 1 capsule (20 mg total) by mouth daily.   EMPAGLIFLOZIN (JARDIANCE) 25 MG TABS TABLET    Take 1 tablet (25 mg total) by mouth daily.   FLUTICASONE (FLONASE) 50 MCG/ACT NASAL SPRAY    Place 2 sprays into both nostrils daily.   GABAPENTIN (NEURONTIN) 100 MG CAPSULE    Take 4 capsules (400 mg total) by mouth 3 (three) times daily.   GLUCOSE BLOOD (ACCU-CHEK GUIDE) TEST STRIP    1 each by Other route daily. for testing   IBUPROFEN (ADVIL) 800 MG TABLET    Take 1 tablet (800 mg total) by mouth 3 (three) times daily. For headache.   INSULIN PEN NEEDLE (PEN NEEDLES) 32G X 5 MM MISC    Use as directed with victoza   LISINOPRIL (ZESTRIL) 10 MG TABLET    Take 1 tablet (10 mg total) by mouth daily.   MELOXICAM (MOBIC) 15 MG TABLET    Take one pill a day with food  for 7 days and then prn thereafter   METFORMIN (GLUCOPHAGE-XR) 500 MG 24 HR TABLET    Take 2 tablets (1,000 mg total) by mouth 2 (two) times daily.   MOMETASONE-FORMOTEROL (DULERA) 100-5 MCG/ACT AERO    Inhale 2 puffs into the lungs daily.   NYSTATIN (MYCOSTATIN/NYSTOP) POWDER    Apply 1 Application topically 2 (two) times daily.   OLOPATADINE (PATANOL) 0.1 % OPHTHALMIC SOLUTION    Place 1 drop into both eyes 2 (two) times daily.   ONDANSETRON (ZOFRAN) 4 MG TABLET    Take 1 tablet (4 mg total) by mouth every 8 (eight) hours as needed for nausea or vomiting.   PANTOPRAZOLE (PROTONIX) 40 MG TABLET    Take 1  tablet (40 mg total) by mouth 2 (two) times daily.   SEMAGLUTIDE, 1 MG/DOSE, 4 MG/3ML SOPN    Inject 1 mg into the skin once a week.   TRAMADOL (ULTRAM) 50 MG TABLET    Take 1 tablet (50 mg total) by mouth every 12 (twelve) hours as needed. Not to be refilled before 30 days  Modified Medications   No medications on file  Discontinued Medications   No medications on file    Subjective: 57 Y O female with PMH of DM,  HTN, HLD, Morbid Obesity/OSA, GERD, Asthma, Arthritis , recurrent umbilical hernia repair with mesh in 07/2021 who is referred from GI Dr Barron Alvine for persistent H pylori infection. Patient is accompanied by her daughter. Reports she has been struggling with persistent H pylori infection since 10/2020 when EGD was positive for H pylori. She completed a course of quadruple therapy ( omeprazole, bismuth subsalicylate, metronidazole, doxycycline * 14 days, Appears she started Bismuth later during the course). Fu H pylori breath test was positive in 01/15/2022. She was then prescribed 14 days course of pylera and omeprazole.  Fu H pylori test was again positive in 05/04/2022. Was prescribed another 14 days course of Pylera and omperazole. Fu H pylori breath test 06/14/22 was positive. Completed another 14 days of Pylera with omeprazole. She was prescribed 14 days course of amoxicillin, clarithromycin and omeprazole on 06/25/22 as insurance declined Pylera. Repeat H pylori breath test positive in 08/03/22. Patient was referred back to GI. S/p EGD 9/12 with findings of chronic active H pylori gastritis   She complains of ongoing abdominal pain, generalized after her umbilical hernia repair. It is continuous. At times feels like a knot. Denies any aggravating and relieving factors. Denies nausea, vomiting but had loose stools every times she eats. She reports compliance with all her prior regimens of H pylori. Reports appetite is good, weight has been stable lately. Denies cough, chest pain but difficulty  breathing related to obesity. Denies GU symptoms or rashes   Review of Systems: all systems reviewed with pertinent positives and negatives as listed above  Past Medical History:  Diagnosis Date  . Arthritis   . Asthma   . Chronic pain of left knee   . Complication of anesthesia    PONV  . Diabetes (HCC)   . GERD (gastroesophageal reflux disease)   . H/O bronchitis   . Hepatic steatosis   . Internal hemorrhoids   . Kidney stones   . Obesity   . Renal disorder   . Shortness of breath dyspnea   . Sleep apnea    past issues - at weight over 400lbs  . Sleep apnea in adult    Polysomnogram pending.  Followed by Dr. Craige Cotta   Past Surgical History:  Procedure Laterality Date  . BIOPSY  11/01/2020   Procedure: BIOPSY;  Surgeon: Shellia Cleverly, DO;  Location: [facility];  Service: Gastroenterology;;  . BIOPSY  05/11/2021   Procedure: BIOPSY;  Surgeon: Shellia Cleverly, DO;  Location: [facility];  Service: Gastroenterology;;  . BIOPSY  11/29/2022   Procedure: BIOPSY;  Surgeon: Shellia Cleverly, DO;  Location: [facility];  Service: Gastroenterology;;  . CESAREAN SECTION     6 c-sections  . CHOLECYSTECTOMY    . COLONOSCOPY WITH PROPOFOL N/A 05/11/2021   Procedure: COLONOSCOPY WITH PROPOFOL;  Surgeon: Shellia Cleverly, DO;  Location: [facility];  Service: Gastroenterology;  Laterality: N/A;  . ESOPHAGOGASTRODUODENOSCOPY (EGD) WITH PROPOFOL N/A 11/01/2020   Procedure: ESOPHAGOGASTRODUODENOSCOPY (EGD) WITH PROPOFOL;  Surgeon: Shellia Cleverly, DO;  Location: [facility];  Service: Gastroenterology;  Laterality: N/A;  . ESOPHAGOGASTRODUODENOSCOPY (EGD) WITH PROPOFOL N/A 11/29/2022   Procedure: ESOPHAGOGASTRODUODENOSCOPY (EGD) WITH PROPOFOL;  Surgeon: Shellia Cleverly, DO;  Location: [facility];  Service: Gastroenterology;  Laterality: N/A;  . HERNIA REPAIR    . KIDNEY STONE SURGERY    . OOPHORECTOMY    . TRANSTHORACIC ECHOCARDIOGRAM  09/2017   Technically difficult  study.  Did not use Definity contrast.  EF was 55 to 60% with moderate LVH and grade 1 diastolic dysfunction.  No significant valvular lesions noted.  No regional wall motion normality but difficult to assess due to poor imaging.   . TUBAL LIGATION    . UPPER GASTROINTESTINAL ENDOSCOPY       Social History   Tobacco Use  . Smoking status: Never  . Smokeless tobacco: Never  Vaping Use  . Vaping status: Never Used  Substance Use Topics  . Alcohol use: No  . Drug use: No    Family History  Problem Relation Age of Onset  . Breast cancer Mother   . Other Father        committed suicide  . Diabetes Father   . Heart attack Maternal Aunt   . Colon cancer Neg Hx   . Esophageal cancer Neg Hx   . Pancreatic cancer Neg Hx   . Stomach cancer Neg Hx   . Liver disease Neg Hx   . Colon polyps Neg Hx   . Rectal cancer Neg Hx     No Known Allergies  Health Maintenance  Topic Date Due  . OPHTHALMOLOGY EXAM  Never done  . Zoster Vaccines- Shingrix (1 of 2) Never done  . Cervical Cancer Screening (HPV/Pap Cotest)  01/19/2018  . DTaP/Tdap/Td (2 - Td or Tdap) 07/07/2020  . MAMMOGRAM  08/20/2022  . INFLUENZA VACCINE  10/18/2022  . COVID-19 Vaccine (6 - 2023-24 season) 11/18/2022  . Diabetic kidney evaluation - Urine ACR  01/16/2023  . HEMOGLOBIN A1C  05/20/2023  . FOOT EXAM  09/13/2023  . Diabetic kidney evaluation - eGFR measurement  10/23/2023  . Colonoscopy  05/12/2031  . Hepatitis C Screening  Completed  . HIV Screening  Completed  . HPV VACCINES  Aged Out    Objective: BP 139/86   Pulse 78   Temp 97.6 F (36.4 C) (Oral)   Ht 5\' 4"  (1.626 m)   Wt (!) 323 lb (146.5 kg)   LMP 04/05/2016   SpO2 94%   BMI 55.44 kg/m    Physical Exam Constitutional:      Appearance: Normal appearance. Morbid Obesity  HENT:     Head: Normocephalic and atraumatic.      Mouth: Mucous membranes are moist.  Eyes:    Conjunctiva/sclera: Conjunctivae normal.     Pupils: Pupils are equal,  round, and bilaterally symmetrical   Cardiovascular:     Rate and Rhythm: Normal rate and regular rhythm.     Heart sounds: s1s2  Pulmonary:     Effort: Pulmonary effort is normal.     Breath sounds: Normal breath sounds.   Abdominal:     General: Non distended     Palpations: soft. Obese abdomen   Musculoskeletal:        General: Normal range of motion.   Skin:    General: Skin is warm and dry.     Comments:  Neurological:     General: grossly non focal     Mental Status: awake, alert and oriented to person, place, and time.   Psychiatric:        Mood and Affect: Mood normal.   Lab Results Lab Results  Component Value Date   WBC 9.4 10/23/2022   HGB 12.9 10/23/2022   HCT 40.9 10/23/2022   MCV 73.7 (L) 10/23/2022   PLT 293.0 10/23/2022    Lab Results  Component Value Date   CREATININE 0.59 10/23/2022   BUN 11 10/23/2022   NA 137 10/23/2022   K 4.1 10/23/2022   CL 100 10/23/2022   CO2 25 10/23/2022    Lab Results  Component Value Date   ALT 19 10/23/2022   AST 14 10/23/2022   ALKPHOS 84 10/23/2022   BILITOT 0.3 10/23/2022    Lab Results  Component Value Date   CHOL 192 06/21/2020   HDL 54 06/21/2020   LDLCALC 115 (H) 06/21/2020   LDLDIRECT 117 (H) 02/28/2021   TRIG 131 06/21/2020   CHOLHDL 3.6 06/21/2020   No results found for: "LABRPR", "RPRTITER" No results found for: "HIV1RNAQUANT", "HIV1RNAVL", "CD4TABS"   EGD  11/29/22 FINAL MICROSCOPIC DIAGNOSIS:  A. DUODENUM, BIOPSY: Duodenal mucosa with preserved villoglandular architecture without increased intraepithelial lymphocytes or evidence of active inflammation. No evidence of gluten sensitive enteropathy. Negative for dysplasia or malignancy.  B. STOMACH, ANTRAL ULCER, BIOPSY:      H. pylori associated chronic active gastritis.      H. pylori identified on HE.      Negative for intestinal metaplasia or dysplasia.  C. STOMACH, BODY, BIOPSY:      H. pylori associated chronic active  gastritis.      H. pylori identified on HE.      Negative for intestinal metaplasia or dysplasia.  11/01/20 FINAL MICROSCOPIC DIAGNOSIS:  A. DUODENUM, BIOPSY:  -  Benign duodenal mucosa  -  No acute inflammation, villous blunting or increased intraepithelial  lymphocytes identified   B. STOMACH, BIOPSY:  -  Chronic active gastritis  -  H. pylori organisms present  -  No intestinal metaplasia identified  -  See comment   COMMENT:   B. Warthin-Starry stain is POSITIVE for organisms consistent with  Helicobacter pylori.   No results found.  Assessment/Plan # Persistent H pylori infection  # Chronic active gastritis  # Chronic abdominal pain # Post prandial diarrhea   Discussed about she would benefit from seeing a GI specialist at an academic center for obtaining gastric tissue for EGD/biopsy for cultures and susceptibility testing to help decision making in guiding abtx. She is already s/p multiple courses of H pylori regimens  Discussed the importance of H pylori to be effectively treated and eradicated to decrease the risk of malignancy in the future.   I will prescribe her  a course of Talicia 4tabs po q 8 hrs for 14 days in the interim until being able to be seen by GI. This may or may not be covered by her insurance  She will fu with [name] after been seen by GI    I have personally spent 68  minutes involved in face-to-face and non-face-to-face activities for this patient on the day of the visit. Professional time spent includes the following activities: Preparing to see the patient (review of tests), Obtaining and/or reviewing separately obtained history (admission/discharge record), Performing a medically appropriate examination and/or evaluation , Ordering medications/tests/procedures, referring and communicating with other health care professionals, Documenting clinical information in the EMR, Independently interpreting results (not separately reported), Communicating results  to the patient/family/caregiver, Counseling and educating the patient/family/caregiver and Care coordination (not separately reported).   Victoriano Lain, MD [facility] 12/21/2022, 10:00 AM

## 2022-12-24 DIAGNOSIS — G8929 Other chronic pain: Secondary | ICD-10-CM | POA: Insufficient documentation

## 2022-12-25 ENCOUNTER — Ambulatory Visit: Payer: Medicaid Other | Admitting: Pharmacist

## 2022-12-25 ENCOUNTER — Other Ambulatory Visit: Payer: Self-pay

## 2022-12-25 DIAGNOSIS — R1033 Periumbilical pain: Secondary | ICD-10-CM

## 2022-12-25 MED ORDER — TRAMADOL HCL 50 MG PO TABS
50.0000 mg | ORAL_TABLET | Freq: Two times a day (BID) | ORAL | 0 refills | Status: DC | PRN
Start: 2022-12-25 — End: 2023-01-25

## 2022-12-26 ENCOUNTER — Other Ambulatory Visit: Payer: Self-pay | Admitting: Family Medicine

## 2022-12-26 DIAGNOSIS — R1084 Generalized abdominal pain: Secondary | ICD-10-CM

## 2022-12-27 ENCOUNTER — Telehealth: Payer: Self-pay

## 2022-12-27 ENCOUNTER — Other Ambulatory Visit (HOSPITAL_COMMUNITY): Payer: Self-pay

## 2022-12-27 ENCOUNTER — Telehealth: Payer: Self-pay | Admitting: Gastroenterology

## 2022-12-27 DIAGNOSIS — A048 Other specified bacterial intestinal infections: Secondary | ICD-10-CM

## 2022-12-27 MED ORDER — AMOXICILLIN 500 MG PO CAPS
1000.0000 mg | ORAL_CAPSULE | Freq: Two times a day (BID) | ORAL | 0 refills | Status: DC
Start: 2022-12-27 — End: 2023-03-19

## 2022-12-27 MED ORDER — RIFABUTIN 150 MG PO CAPS: 150.0000 mg | ORAL_CAPSULE | Freq: Two times a day (BID) | ORAL | 0 refills | Status: DC

## 2022-12-27 MED ORDER — OMEPRAZOLE 40 MG PO CPDR
40.0000 mg | DELAYED_RELEASE_CAPSULE | Freq: Two times a day (BID) | ORAL | 0 refills | Status: DC
Start: 2022-12-27 — End: 2023-02-26

## 2022-12-27 NOTE — Addendum Note (Signed)
Addended by: Jennette Kettle on: 12/27/2022 04:49 PM   Modules accepted: Orders

## 2022-12-27 NOTE — Telephone Encounter (Signed)
Patient called, the Phoebe Perch is not covered by her Medicaid.   Sandie Ano, RN

## 2022-12-27 NOTE — Telephone Encounter (Signed)
Sent in rifabutin 150mg  BID, omeprazole 40 mg BID, and amoxicillin 1g BID for H. Pylori salvage regimen. Called patient to discuss changes and importance of taking rifabutin with a high-fat meal. She knows to hold pantoprazole while taking omeprazole. Reviewed potential for reduced tramadol concentrations while taking rifabutin. All questions answered. Marchelle Folks

## 2022-12-27 NOTE — Telephone Encounter (Signed)
Pharmacy team,  Could you order the talicia as separate components please?

## 2022-12-27 NOTE — Telephone Encounter (Signed)
Dr Barron Alvine- Please see recent ID office note. Do I need to refer her to [facility] or [facility] for additional testing?

## 2022-12-27 NOTE — Telephone Encounter (Signed)
Inbound call from patient stating that infectious disease was going to refer her back to Dr. Barron Alvine to have more biopsies taken for H. Pylori. Patient is requesting a call back to discuss. Please advise.

## 2022-12-28 ENCOUNTER — Other Ambulatory Visit (HOSPITAL_COMMUNITY): Payer: Self-pay

## 2022-12-28 MED ORDER — RIFABUTIN 150 MG PO CAPS
150.0000 mg | ORAL_CAPSULE | Freq: Two times a day (BID) | ORAL | 0 refills | Status: DC
Start: 2022-12-28 — End: 2023-03-24
  Filled 2022-12-28: qty 28, 14d supply, fill #0

## 2022-12-28 NOTE — Addendum Note (Signed)
Addended by: Linna Hoff D on: 12/28/2022 11:41 AM   Modules accepted: Orders

## 2022-12-28 NOTE — Telephone Encounter (Signed)
Patient wants to be referred to [facility] instead. Are their GI clinics able to perform culture and sensitivity?

## 2022-12-28 NOTE — Telephone Encounter (Signed)
Patient advised that culture and sensitivity testing for H Pylori is only done at [facility]. Patient verbalizes understanding and states she will figure out a way to get to [facility]. 40 pages of records/referral placed to [facility].

## 2022-12-28 NOTE — Telephone Encounter (Signed)
===  View-only below this line=== ----- Message ----- From: Shellia Cleverly, DO Sent: 12/27/2022  12:48 PM EDT To: Richardson Chiquito, RN  Yes, please place referral to [facility] for EGD with biopsies for H. pylori culture and sensitivity

## 2022-12-28 NOTE — Telephone Encounter (Signed)
Patient called, her pharmacy does not have rifabutin in stock. Called [facility], the [facility] outpatient location has enough in stock, notified patient Rx would be sent there and provided her with their address and phone number.   Sandie Ano, RN

## 2023-01-01 ENCOUNTER — Ambulatory Visit: Payer: Medicaid Other

## 2023-01-03 ENCOUNTER — Ambulatory Visit: Payer: Medicaid Other

## 2023-01-07 ENCOUNTER — Encounter: Payer: Self-pay | Admitting: Family Medicine

## 2023-01-07 ENCOUNTER — Ambulatory Visit (INDEPENDENT_AMBULATORY_CARE_PROVIDER_SITE_OTHER): Payer: Medicaid Other | Admitting: Family Medicine

## 2023-01-07 VITALS — BP 138/88 | Ht 64.0 in | Wt 324.0 lb

## 2023-01-07 DIAGNOSIS — M170 Bilateral primary osteoarthritis of knee: Secondary | ICD-10-CM

## 2023-01-07 MED ORDER — METHYLPREDNISOLONE ACETATE 40 MG/ML IJ SUSP
40.0000 mg | Freq: Once | INTRAMUSCULAR | Status: AC
Start: 2023-01-07 — End: 2023-01-07
  Administered 2023-01-07: 40 mg via INTRA_ARTICULAR

## 2023-01-07 MED ORDER — METHOCARBAMOL 500 MG PO TABS: 500.0000 mg | ORAL_TABLET | Freq: Three times a day (TID) | ORAL | 1 refills | Status: DC | PRN

## 2023-01-07 NOTE — Progress Notes (Signed)
PCP: Lincoln Brigham, MD  Subjective:   HPI: Patient is a 57 y.o. female here for knee arthritis.  Patient reports last injections helped about 1-1.5 months. She continues to work on weight loss with her PCP.  Past Medical History:  Diagnosis Date   Arthritis    Asthma    Chronic pain of left knee    Complication of anesthesia    PONV   Diabetes (HCC)    GERD (gastroesophageal reflux disease)    H/O bronchitis    Hepatic steatosis    Internal hemorrhoids    Kidney stones    Obesity    Renal disorder    Shortness of breath dyspnea    Sleep apnea    past issues - at weight over 400lbs   Sleep apnea in adult    Polysomnogram pending.  Followed by Dr. Craige Cotta    Current Outpatient Medications on File Prior to Visit  Medication Sig Dispense Refill   Accu-Chek Softclix Lancets lancets Use as instructed 100 each 12   acetaminophen (TYLENOL) 650 MG CR tablet Take 650-1,300 mg by mouth every 8 (eight) hours as needed for pain. Last dose (2) around 0930 am.     albuterol (PROVENTIL) (5 MG/ML) 0.5% nebulizer solution Take 0.5 mLs (2.5 mg total) by nebulization every 6 (six) hours as needed for wheezing or shortness of breath. 20 mL 0   amoxicillin (AMOXIL) 500 MG capsule Take 2 capsules (1,000 mg total) by mouth 2 (two) times daily. 56 capsule 0   BINAXNOW COVID-19 AG HOME TEST KIT TEST AS DIRECTED TODAY (Patient not taking: Reported on 11/20/2022)     cetirizine (ZYRTEC) 10 MG tablet Take 1 tablet (10 mg total) by mouth daily. (Patient not taking: Reported on 11/20/2022) 30 tablet 11   DULoxetine (CYMBALTA) 20 MG capsule Take 1 capsule (20 mg total) by mouth daily. 60 capsule 3   empagliflozin (JARDIANCE) 25 MG TABS tablet Take 1 tablet (25 mg total) by mouth daily. 90 tablet 3   fluticasone (FLONASE) 50 MCG/ACT nasal spray Place 2 sprays into both nostrils daily. (Patient not taking: Reported on 11/20/2022) 16 g 6   gabapentin (NEURONTIN) 100 MG capsule TAKE 4 CAPSULES(400 MG) BY MOUTH THREE  TIMES DAILY 360 capsule 2   glucose blood (ACCU-CHEK GUIDE) test strip 1 each by Other route daily. for testing 100 strip 1   ibuprofen (ADVIL) 800 MG tablet Take 1 tablet (800 mg total) by mouth 3 (three) times daily. For headache. (Patient not taking: Reported on 11/20/2022) 15 tablet 0   Insulin Pen Needle (PEN NEEDLES) 32G X 5 MM MISC Use as directed with victoza 30 each 3   lisinopril (ZESTRIL) 10 MG tablet Take 1 tablet (10 mg total) by mouth daily. 30 tablet 9   meloxicam (MOBIC) 15 MG tablet Take one pill a day with food for 7 days and then prn thereafter (Patient not taking: Reported on 12/21/2022) 40 tablet 2   metFORMIN (GLUCOPHAGE-XR) 500 MG 24 hr tablet Take 2 tablets (1,000 mg total) by mouth 2 (two) times daily. 360 tablet 3   mometasone-formoterol (DULERA) 100-5 MCG/ACT AERO Inhale 2 puffs into the lungs daily. (Patient not taking: Reported on 11/20/2022) 39 g 3   nystatin (MYCOSTATIN/NYSTOP) powder Apply 1 Application topically 2 (two) times daily. (Patient not taking: Reported on 12/21/2022) 60 g 2   olopatadine (PATANOL) 0.1 % ophthalmic solution Place 1 drop into both eyes 2 (two) times daily. (Patient not taking: Reported on 11/20/2022) 5 mL  12   omeprazole (PRILOSEC) 40 MG capsule Take 1 capsule (40 mg total) by mouth 2 (two) times daily. 28 capsule 0   ondansetron (ZOFRAN) 4 MG tablet Take 1 tablet (4 mg total) by mouth every 8 (eight) hours as needed for nausea or vomiting. (Patient not taking: Reported on 11/20/2022) 30 tablet 0   pantoprazole (PROTONIX) 40 MG tablet Take 1 tablet (40 mg total) by mouth 2 (two) times daily. 90 tablet 1   rifabutin (MYCOBUTIN) 150 MG capsule Take 1 capsule (150 mg total) by mouth 2 (two) times daily. Take with a high-fat meal. 28 capsule 0   Semaglutide, 1 MG/DOSE, 4 MG/3ML SOPN Inject 1 mg into the skin once a week. 1 mL 0   traMADol (ULTRAM) 50 MG tablet Take 1 tablet (50 mg total) by mouth every 12 (twelve) hours as needed. Not to be refilled before  30 days 60 tablet 0   No current facility-administered medications on file prior to visit.    Past Surgical History:  Procedure Laterality Date   BIOPSY  11/01/2020   Procedure: BIOPSY;  Surgeon: Shellia Cleverly, DO;  Location: [facility];  Service: Gastroenterology;;   BIOPSY  05/11/2021   Procedure: BIOPSY;  Surgeon: Shellia Cleverly, DO;  Location: [facility];  Service: Gastroenterology;;   BIOPSY  11/29/2022   Procedure: BIOPSY;  Surgeon: Shellia Cleverly, DO;  Location: [facility];  Service: Gastroenterology;;   CESAREAN SECTION     6 c-sections   CHOLECYSTECTOMY     COLONOSCOPY WITH PROPOFOL N/A 05/11/2021   Procedure: COLONOSCOPY WITH PROPOFOL;  Surgeon: Shellia Cleverly, DO;  Location: [facility];  Service: Gastroenterology;  Laterality: N/A;   ESOPHAGOGASTRODUODENOSCOPY (EGD) WITH PROPOFOL N/A 11/01/2020   Procedure: ESOPHAGOGASTRODUODENOSCOPY (EGD) WITH PROPOFOL;  Surgeon: Shellia Cleverly, DO;  Location: [facility];  Service: Gastroenterology;  Laterality: N/A;   ESOPHAGOGASTRODUODENOSCOPY (EGD) WITH PROPOFOL N/A 11/29/2022   Procedure: ESOPHAGOGASTRODUODENOSCOPY (EGD) WITH PROPOFOL;  Surgeon: Shellia Cleverly, DO;  Location: [facility];  Service: Gastroenterology;  Laterality: N/A;   HERNIA REPAIR     KIDNEY STONE SURGERY     OOPHORECTOMY     TRANSTHORACIC ECHOCARDIOGRAM  09/2017   Technically difficult study.  Did not use Definity contrast.  EF was 55 to 60% with moderate LVH and grade 1 diastolic dysfunction.  No significant valvular lesions noted.  No regional wall motion normality but difficult to assess due to poor imaging.    TUBAL LIGATION     UPPER GASTROINTESTINAL ENDOSCOPY      No Known Allergies  BP 138/88   Ht 5\' 4"  (1.626 m)   Wt (!) 324 lb (147 kg)   LMP 04/05/2016   BMI 55.61 kg/m       No data to display              No data to display              Objective:  Physical Exam:  Gen: NAD, comfortable in exam  room   Assessment & Plan:  1. Bilateral knee arthritis - injections repeated today.  Advised if this does not last 3 months to consider consultation with Dr. Huntley Estelle to discuss geniculate nerve blocks - she will call us about this.  She's on nsaids and tramadol.  Interested in trying a muscle relaxant - rx robaxin.  After informed written consent timeout was performed, patient was seated in chair in exam room. Right knee was prepped with alcohol swab and  utilizing anteromedial approach, patient's right knee was injected intraarticularly with 3:1 lidocaine: depomedrol. Patient tolerated the procedure well without immediate complications.  After informed written consent timeout was performed, patient was seated in chair in exam room. Left knee was prepped with alcohol swab and utilizing anteromedial approach, patient's left knee was injected intraarticularly with 3:1 lidocaine: depomedrol. Patient tolerated the procedure well without immediate complications.

## 2023-01-07 NOTE — Patient Instructions (Signed)
We repeated your knee injections today. If this doesn't last at least 3 months call us and we can have you consult with Dr. Huntley Estelle to discuss nerve blocks for your knees. Try robaxin as needed for muscle spasms.

## 2023-01-08 ENCOUNTER — Ambulatory Visit: Payer: Medicaid Other

## 2023-01-10 ENCOUNTER — Ambulatory Visit: Payer: Medicaid Other | Admitting: Family Medicine

## 2023-01-10 ENCOUNTER — Ambulatory Visit: Payer: Medicaid Other | Admitting: Pharmacist

## 2023-01-14 ENCOUNTER — Ambulatory Visit: Payer: Medicaid Other | Admitting: Pharmacist

## 2023-01-14 ENCOUNTER — Ambulatory Visit: Payer: Medicaid Other

## 2023-01-14 DIAGNOSIS — M25561 Pain in right knee: Secondary | ICD-10-CM | POA: Diagnosis not present

## 2023-01-14 DIAGNOSIS — M25551 Pain in right hip: Secondary | ICD-10-CM | POA: Diagnosis not present

## 2023-01-14 DIAGNOSIS — M25562 Pain in left knee: Secondary | ICD-10-CM | POA: Diagnosis not present

## 2023-01-14 DIAGNOSIS — G8929 Other chronic pain: Secondary | ICD-10-CM

## 2023-01-14 DIAGNOSIS — R2689 Other abnormalities of gait and mobility: Secondary | ICD-10-CM | POA: Diagnosis not present

## 2023-01-14 NOTE — Therapy (Signed)
OUTPATIENT PHYSICAL THERAPY TREATMENT NOTE   Patient Name: Rita Bass MRN: [id number] DOB:[birth year], 57 y.o., female Today's Date: 01/14/2023  END OF SESSION:  PT End of Session - 01/14/23 1155     Visit Number 4    Number of Visits 9    Date for PT Re-Evaluation 02/19/23    Authorization Type [employer]    Authorization Time Period Approved 5 visits 12/20/22-02/17/23    Authorization - Visit Number 1    Authorization - Number of Visits 5    PT Start Time 1155   arrived late   PT Stop Time 1228    PT Time Calculation (min) 33 min    Activity Tolerance Patient limited by pain    Behavior During Therapy WFL for tasks assessed/performed               Past Medical History:  Diagnosis Date   Arthritis    Asthma    Chronic pain of left knee    Complication of anesthesia    PONV   Diabetes (HCC)    GERD (gastroesophageal reflux disease)    H/O bronchitis    Hepatic steatosis    Internal hemorrhoids    Kidney stones    Obesity    Renal disorder    Shortness of breath dyspnea    Sleep apnea    past issues - at weight over 400lbs   Sleep apnea in adult    Polysomnogram pending.  Followed by Dr. Craige Cotta   Past Surgical History:  Procedure Laterality Date   BIOPSY  11/01/2020   Procedure: BIOPSY;  Surgeon: Shellia Cleverly, DO;  Location: [facility];  Service: Gastroenterology;;   BIOPSY  05/11/2021   Procedure: BIOPSY;  Surgeon: Shellia Cleverly, DO;  Location: [facility];  Service: Gastroenterology;;   BIOPSY  11/29/2022   Procedure: BIOPSY;  Surgeon: Shellia Cleverly, DO;  Location: [facility];  Service: Gastroenterology;;   CESAREAN SECTION     6 c-sections   CHOLECYSTECTOMY     COLONOSCOPY WITH PROPOFOL N/A 05/11/2021   Procedure: COLONOSCOPY WITH PROPOFOL;  Surgeon: Shellia Cleverly, DO;  Location: [facility];  Service: Gastroenterology;  Laterality: N/A;   ESOPHAGOGASTRODUODENOSCOPY (EGD) WITH PROPOFOL N/A 11/01/2020    Procedure: ESOPHAGOGASTRODUODENOSCOPY (EGD) WITH PROPOFOL;  Surgeon: Shellia Cleverly, DO;  Location: [facility];  Service: Gastroenterology;  Laterality: N/A;   ESOPHAGOGASTRODUODENOSCOPY (EGD) WITH PROPOFOL N/A 11/29/2022   Procedure: ESOPHAGOGASTRODUODENOSCOPY (EGD) WITH PROPOFOL;  Surgeon: Shellia Cleverly, DO;  Location: [facility];  Service: Gastroenterology;  Laterality: N/A;   HERNIA REPAIR     KIDNEY STONE SURGERY     OOPHORECTOMY     TRANSTHORACIC ECHOCARDIOGRAM  09/2017   Technically difficult study.  Did not use Definity contrast.  EF was 55 to 60% with moderate LVH and grade 1 diastolic dysfunction.  No significant valvular lesions noted.  No regional wall motion normality but difficult to assess due to poor imaging.    TUBAL LIGATION     UPPER GASTROINTESTINAL ENDOSCOPY     Patient Active Problem List   Diagnosis Date Noted   Chronic abdominal pain 12/24/2022   Medication management 12/21/2022   Chronic Helicobacter pylori gastritis 11/29/2022   Gastric ulcer without hemorrhage or perforation 11/29/2022   Gastroesophageal reflux disease with esophagitis without hemorrhage 11/29/2022   Change in bowel habits 11/29/2022   Diarrhea 11/29/2022   Asthma 10/22/2022   Primary osteoarthritis of both knees 10/03/2022   Wound check, abscess  07/21/2022   Type 2 diabetes mellitus with diabetic neuropathy, unspecified (HCC) 04/30/2022   Chronic osteoarthritis 04/30/2022   Rash 01/15/2022   External hemorrhoid 09/15/2021   Generalized abdominal pain 08/30/2021   Ventral hernia 08/16/2021   Allergic conjunctivitis 07/28/2021   Grade II internal hemorrhoids    H. pylori infection 11/08/2020   Gastritis and gastroduodenitis    Lung nodule 10/26/2020   Ventral hernia without obstruction or gangrene 10/04/2020   Chronic diarrhea 09/16/2020   Abnormal Pap smear of cervix 08/18/2020   Hyperlipidemia associated with type 2 diabetes mellitus (HCC) 06/21/2020   Right hip pain  06/21/2020   Orthopnea 01/04/2020   Chronic cough 12/23/2019   Dyspepsia 05/22/2019   Essential hypertension 04/07/2019   Vaginal bleeding 01/27/2019   Low back pain 01/27/2019   BMI 50.0-59.9, adult (HCC) 12/05/2017   Diastolic dysfunction with heart failure (HCC) 10/25/2017   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   Abscess 10/25/2017   Diabetic nephropathy associated with type 2 diabetes mellitus (HCC) 10/25/2017   Type 2 diabetes mellitus with complication (HCC) 09/13/2017   Sleep apnea 08/16/2017   Chronic pain of right knee 07/13/2016   Morbid obesity (HCC) 02/04/2015   Menorrhagia with regular cycle 11/18/2014    PCP: Lincoln Brigham, MD  REFERRING PROVIDER: Nestor Ramp, MD   REFERRING DIAG: M17.9 (ICD-10-CM) - Osteoarthritis of knee, unspecified laterality, unspecified osteoarthritis type   THERAPY DIAG:  Chronic pain of right knee  Chronic pain of left knee  Rationale for Evaluation and Treatment: Rehabilitation  ONSET DATE: Chronic  SUBJECTIVE:   SUBJECTIVE STATEMENT: Pt presents to PT with continued reports of bilateral knee pain, R>L. Has been compliant with HEP and thinks they help a little bit.   PERTINENT HISTORY: DM II, HTN  PAIN:  Are you having pain?  Yes: NPRS scale: 6/10 Worst: 8/10 Pain location: bilateral knees,  Pain description: sharp, achy Aggravating factors: walking, stairs,  Relieving factors: medication, rest, injections  PRECAUTIONS: None  RED FLAGS: None   WEIGHT BEARING RESTRICTIONS: No  FALLS:  Has patient fallen in last 6 months? No  LIVING ENVIRONMENT: Lives with: lives with their family Lives in: House/apartment Stairs: Yes: External: 3 steps; none Has following equipment at home: None  OCCUPATION: Not currently working  PLOF: Independent  PATIENT GOALS: decrease knee pain, improve comfort with walking, lose weight  NEXT MD VISIT: 10/22/22  OBJECTIVE:   DIAGNOSTIC FINDINGS: See imaging   PATIENT SURVEYS:  FOTO: 39%  function; 54% predicted  12/20/22 42%  COGNITION: Overall cognitive status: Within functional limits for tasks assessed     SENSATION: WFL  POSTURE: genu valgum and large body habitus  PALPATION: Patellar hypomobility, TTP to distal quad  LOWER EXTREMITY ROM:  Active ROM Right eval Left eval R 12/20/22 L 12/20/22  Knee flexion 92 95 94d 116d  Knee extension 7 4 -4d 0d   (Blank rows = not tested)  LOWER EXTREMITY MMT:  MMT Right eval Left eval  Hip flexion 3+/5 3+/5  Hip extension    Hip abduction 3+/5 3+/5  Hip adduction    Hip internal rotation    Hip external rotation    Knee flexion 3+/5 3+/5  Knee extension 3+/5 3+/5  Ankle dorsiflexion    Ankle plantarflexion    Ankle inversion    Ankle eversion     (Blank rows = not tested)  LOWER EXTREMITY SPECIAL TESTS:  DNT  FUNCTIONAL TESTS:  30 Second Sit to Stand: 7 reps with UE 10//3/24 13 stands  UE assist from lap  GAIT: Distance walked: 29ft Assistive device utilized: None Level of assistance: Complete Independence Comments: antalgic gait, wide BoS   TREATMENT: [facility] Adult PT Treatment:                                                DATE: 01/14/23 Therapeutic Exercise: NuStep lvl 5 UE/LE x 3 min while taking subjective FAQ with ball 3x10 Supine SLR 2x10 - R difficult Bridge with ball 3x10 Supine clamshell 2x15 black band Supine march 2x20 black band STS no UE 3x5  [facility] Adult PT Treatment:                                                DATE: 12/20/22 Therapeutic Exercise: 10x STS FAQs 10/10 SLR 10/10 Supine hip fallouts GTB 10x B, 10/10 unilaterally S/L clams GTB 10/10 SAQs 10/10  [facility] Adult PT Treatment:                                                DATE: 10/31/22 Therapeutic Exercise: Nustep level 5 x 5 mins LE only LAQ 2x10 2.5#  Seated marching 2.5# 2x30" Seated clamshell GTB 2x15  PATIENT EDUCATION:  Education details: eval findings, FOTO, HEP, POC Person educated: Patient Education  method: Explanation, Demonstration, and Handouts Education comprehension: verbalized understanding and returned demonstration  HOME EXERCISE PROGRAM: Access Code: [email or web address] URL: [email or web address] Date: 12/20/2022 Prepared by: Gustavus Bryant  Exercises - Active Straight Leg Raise with Quad Set  - 1 x daily - 7 x weekly - 2 sets - 10 reps - Seated Long Arc Quad  - 1 x daily - 7 x weekly - 3 sets - 10 reps - Hooklying Single Leg Bent Knee Fallouts with Resistance  - 1 x daily - 7 x weekly - 3 sets - 10 reps - Supine Knee Extension Strengthening  - 1 x daily - 7 x weekly - 3 sets - 10 reps  ASSESSMENT:  CLINICAL IMPRESSION: Pt was able to complete all prescribed exercises, continues to be limited by significant knee pain. Therapy focused exercises on improving quad and proximal hip strength for decreasing knee pain and improving functional mobility. Pt continues to need skilled PT for improving strength, will continue per POC.   OBJECTIVE IMPAIRMENTS: Abnormal gait, decreased mobility, difficulty walking, decreased ROM, decreased strength, and pain.   ACTIVITY LIMITATIONS: standing, squatting, stairs, transfers, and locomotion level  PARTICIPATION LIMITATIONS: driving, shopping, community activity, and yard work  PERSONAL FACTORS: Fitness and 1-2 comorbidities: DM II, HTN  are also affecting patient's functional outcome.    GOALS: Goals reviewed with patient? No  SHORT TERM GOALS: Target date: 11/06/2022   Pt will be compliant and knowledgeable with initial HEP for improved comfort and carryover Baseline: initial HEP given  Goal status: Partially met  2.  Pt will self report bilateral knee pain no greater than 6/10 for improved comfort and functional ability Baseline: 8/10 at worst; 12/20/22 6/10 Goal status: Met  LONG TERM GOALS: Target date: 02/19/2023   Pt will improve FOTO function score to no less than 54% as  proxy for functional improvement Baseline: 39%  function; 12/19/22 41% Goal status: INITIAL  2.  Pt will self report bilateral knee pain no greater than 3/10 for improved comfort and functional ability Baseline: 8/10 at worst Goal status: INITIAL   3.  Pt will increase 30 Second Sit to Stand rep count to no less than 9 reps for improved balance, strength, and functional mobility Baseline: 7 reps with UE; 12/20/22 13 reps Goal status: Met  4.  Pt will improve bilateral knee AROM to no less than 3-110 degrees for improved comfort and functional mobility with stairs and other community navigatoin Baseline: see ROM chart Goal status: INITIAL   PLAN:  PT FREQUENCY: 2x/week  PT DURATION: 8 weeks  PLANNED INTERVENTIONS: Therapeutic exercises, Therapeutic activity, Neuromuscular re-education, Balance training, Gait training, Patient/Family education, Self Care, Joint mobilization, Aquatic Therapy, Dry Needling, Electrical stimulation, Cryotherapy, Moist heat, Vasopneumatic device, Manual therapy, and Re-evaluation  PLAN FOR NEXT SESSION: assess HEP response, progress quad and proximal hip strength  Check all possible CPT codes: 16109 - PT Re-evaluation, 97110- Therapeutic Exercise, [phone]- Neuro Re-education, [phone] - Gait Training, [phone] - Manual Therapy, 97530 - Therapeutic Activities, [phone] - Self Care, and [phone] - Vaso    Check all conditions that are expected to impact treatment: {Conditions expected to impact treatment:Diabetes mellitus and Musculoskeletal disorders   If treatment provided at initial evaluation, no treatment charged due to lack of authorization.       Eloy End, PT 01/14/2023, 12:57 PM

## 2023-01-15 NOTE — Telephone Encounter (Signed)
I have spoken to Maralyn Sago at [facility] (phone [phone] who states that referral has been received and all information needed is available to them.

## 2023-01-16 ENCOUNTER — Ambulatory Visit (INDEPENDENT_AMBULATORY_CARE_PROVIDER_SITE_OTHER): Payer: Medicaid Other | Admitting: Pharmacist

## 2023-01-16 ENCOUNTER — Other Ambulatory Visit: Payer: Self-pay | Admitting: Pharmacist

## 2023-01-16 ENCOUNTER — Encounter: Payer: Self-pay | Admitting: Pharmacist

## 2023-01-16 VITALS — BP 134/83 | HR 79 | Wt 320.8 lb

## 2023-01-16 DIAGNOSIS — E1140 Type 2 diabetes mellitus with diabetic neuropathy, unspecified: Secondary | ICD-10-CM

## 2023-01-16 DIAGNOSIS — A048 Other specified bacterial intestinal infections: Secondary | ICD-10-CM

## 2023-01-16 DIAGNOSIS — Z7985 Long-term (current) use of injectable non-insulin antidiabetic drugs: Secondary | ICD-10-CM | POA: Diagnosis not present

## 2023-01-16 DIAGNOSIS — E1121 Type 2 diabetes mellitus with diabetic nephropathy: Secondary | ICD-10-CM

## 2023-01-16 DIAGNOSIS — E118 Type 2 diabetes mellitus with unspecified complications: Secondary | ICD-10-CM

## 2023-01-16 DIAGNOSIS — R0609 Other forms of dyspnea: Secondary | ICD-10-CM

## 2023-01-16 DIAGNOSIS — I10 Essential (primary) hypertension: Secondary | ICD-10-CM

## 2023-01-16 MED ORDER — LISINOPRIL 10 MG PO TABS: 10.0000 mg | ORAL_TABLET | Freq: Every day | ORAL | 9 refills | Status: DC

## 2023-01-16 MED ORDER — ACCU-CHEK SOFTCLIX LANCETS MISC
12 refills | Status: DC
Start: 2023-01-16 — End: 2023-05-06

## 2023-01-16 MED ORDER — ACCU-CHEK GUIDE VI STRP
1.0000 | ORAL_STRIP | Freq: Every day | 1 refills | Status: AC
Start: 2023-01-16 — End: ?

## 2023-01-16 MED ORDER — OZEMPIC (2 MG/DOSE) 8 MG/3ML ~~LOC~~ SOPN
2.0000 mg | PEN_INJECTOR | SUBCUTANEOUS | 5 refills | Status: DC
Start: 2023-01-16 — End: 2023-03-04

## 2023-01-16 NOTE — Assessment & Plan Note (Signed)
Diabetes longstanding currently uncontrolled. Patient is able to verbalize appropriate hypoglycemia management plan. Medication adherence appears fine, concerns with access due to insurance coverage. Control is suboptimal due to A1c 9.2 (11/20/2022).  Has not taken any Ozempic for ~ 3 weeks.  -Continued SGLT2-I Jardiance (empagliflozin) 25 mg.  -Continued metformin XR 500mg  2 tabs BID.  -Provided sample Ozempic 1mg  in office with LOT: [id number] and EXP: 12/16/2024 -Increased dose of GLP-1 Ozempic (semaglutide) from 1 mg to 2 mg following use of sample pen, new prescription provided.   -Patient educated on purpose, proper use, and potential adverse effects of Ozempic.  -Extensively discussed pathophysiology of diabetes, recommended lifestyle interventions, dietary effects on blood sugar control.  -Counseled on s/sx of and management of hypoglycemia.  -Next A1c anticipated < 8.

## 2023-01-16 NOTE — Patient Instructions (Addendum)
It was nice to see you today!  Your goal blood sugar is 80-130 before eating and less than 180 after eating.  Medication Changes: START using Ozempic inject 1mg  into the skin weekly. This was provided during office visit. Then, increase to Ozempic 2mg , new prescription sent today.  Restart Lisinopril 10 mg take one tablet by mouth daily  Continue all other medication the same.  Monitor blood sugars at home and keep a log (glucometer or piece of paper) to bring with you to your next visit.  Keep up the good work with diet and exercise. Aim for a diet full of vegetables, fruit and lean meats (chicken, [place], fish). Try to limit salt intake by eating fresh or frozen vegetables (instead of canned), rinse canned vegetables prior to cooking and do not add any additional salt to meals.

## 2023-01-16 NOTE — Progress Notes (Signed)
S:     Chief Complaint  Patient presents with   Medication Management    T2DM   57 y.o. female who presents for diabetes evaluation, education, and management. Patient arrives in good spirits and presents without any assistance. Patient is accompanied by daughter, Rita Bass. Expresses concerns with insurance coverage. Expresses concerns related to H. Pylori chronic infection. Reports leg and stomach hurt consistently and has appointments coming up to address these concerns.   Patient was referred and last seen by Primary Care Provider, Dr. Sherrilee Bass, on 10/22/2022.   PMH is significant for T2DM.  At last visit, Rita Bass was requested; Restarted at Rita Bass 1mg  planning to start 2mg  4 weeks later; Lisinopril was restarted; stopped Trulicity due to lack of response.   Patient reports Diabetes was diagnosed in 2019.   Family/Social History: Co-caregiver for grandchildren  Current diabetes medications include: metformin, semaglutide, empagliflozin Current hypertension medications include: lisinopril  Patient reports adherence to taking all medications as prescribed. Is a fair historian.  Do you feel that your medications are working for you? yes Have you been experiencing any side effects to the medications prescribed? no Do you have any problems obtaining medications due to transportation or finances? Yes, insurance coverage concerns Insurance coverage: [employer]  Patient denies hypoglycemic events.  Reported home fasting blood sugars: 120-140's  Reported 2 hour post-meal/random blood sugars: 220's.  Patient denies nocturia (nighttime urination).  Patient denies neuropathy (nerve pain). Patient denies visual changes. Patient denies self foot exams.   Patient reported dietary habits:  Dinner: eats late  Within the past 12 months, did you worry whether your food would run out before you got money to buy more? no Within the past 12 months, did the food you bought run  out, and you didn't have money to get more? no   O:   Review of Systems  Gastrointestinal:  Positive for abdominal pain.       H. Pylori  Musculoskeletal:  Positive for joint pain.       Right knee    Physical Exam Constitutional:      Appearance: Normal appearance.  Neurological:     Mental Status: She is alert.  Psychiatric:        Mood and Affect: Mood normal.        Behavior: Behavior normal.        Thought Content: Thought content normal.        Judgment: Judgment normal.      Lab Results  Component Value Date   HGBA1C 9.2 (A) 11/20/2022   Vitals:   01/16/23 1012  BP: 134/83  Pulse: 79  SpO2: 100%    Lipid Panel     Component Value Date/Time   CHOL 192 06/21/2020 1547   TRIG 131 06/21/2020 1547   HDL 54 06/21/2020 1547   CHOLHDL 3.6 06/21/2020 1547   LDLCALC 115 (H) 06/21/2020 1547   LDLDIRECT 117 (H) 02/28/2021 0917    Patient is participating in a Managed Medicaid Plan:  Yes   A/P: Diabetes longstanding currently uncontrolled. Patient is able to verbalize appropriate hypoglycemia management plan. Medication adherence appears fine, concerns with access due to insurance coverage. Control is suboptimal due to A1c 9.2 (11/20/2022).  Has not taken any Ozempic for ~ 3 weeks.  -Continued SGLT2-I Jardiance (empagliflozin) 25 mg.  -Continued metformin XR 500mg  2 tabs BID.  -Provided sample Ozempic 1mg  in office with LOT: [id number] and EXP: 12/16/2024 -Increased dose of GLP-1 Ozempic (semaglutide) from 1  mg to 2 mg following use of sample pen, new prescription provided.   -Patient educated on purpose, proper use, and potential adverse effects of Ozempic.  -Extensively discussed pathophysiology of diabetes, recommended lifestyle interventions, dietary effects on blood sugar control.  -Counseled on s/sx of and management of hypoglycemia.  -Next A1c anticipated < 8.   Hypertension longstanding currently uncontrolled. Blood pressure goal of <130/80 mmHg.  Medication adherence fair, stopped taking lisinopril due to drug-drug interaction uncertainty with new medications. Blood pressure control is suboptimal due to 134/83 mmHg. -Restarted Lisinopril 10 mg.  Written patient instructions provided. Patient verbalized understanding of treatment plan.  Total time in face to face counseling 33 minutes.    Follow-up:  Pharmacist Dr. Raymondo Band on 04/03/2023 at 9:30 am. PCP clinic visit with Dr. Sherrilee Gilles, MD on 01/22/2023 at 11:10 am Patient seen with Rita Bass, PharmD Candidate and Rita Bass, PharmD Candidate.

## 2023-01-16 NOTE — Assessment & Plan Note (Signed)
Hypertension longstanding currently uncontrolled. Blood pressure goal of <130/80 mmHg. Medication adherence fair, stopped taking lisinopril due to drug-drug interaction uncertainty with new medications. Blood pressure control is suboptimal due to 134/83 mmHg. -Restarted Lisinopril 10 mg.

## 2023-01-17 NOTE — Progress Notes (Signed)
Reviewed and agree with Dr Koval's plan.   

## 2023-01-21 NOTE — Telephone Encounter (Signed)
Patient is scheduled with [facility] for endoscopy 04/25/23.

## 2023-01-21 NOTE — Progress Notes (Deleted)
    SUBJECTIVE:   CHIEF COMPLAINT / HPI:   ***  PERTINENT  PMH / PSH: ***  OBJECTIVE:   LMP 04/05/2016   ***  ASSESSMENT/PLAN:   No problem-specific Assessment & Plan notes found for this encounter.     Lincoln Brigham, MD [facility]

## 2023-01-22 ENCOUNTER — Ambulatory Visit: Payer: Medicaid Other | Admitting: Family Medicine

## 2023-01-22 ENCOUNTER — Ambulatory Visit: Payer: Medicaid Other

## 2023-01-25 ENCOUNTER — Ambulatory Visit (INDEPENDENT_AMBULATORY_CARE_PROVIDER_SITE_OTHER): Payer: Medicaid Other | Admitting: Family Medicine

## 2023-01-25 ENCOUNTER — Encounter: Payer: Self-pay | Admitting: Family Medicine

## 2023-01-25 ENCOUNTER — Ambulatory Visit: Payer: Medicaid Other | Attending: Family Medicine

## 2023-01-25 VITALS — BP 127/87 | HR 85 | Ht 64.0 in | Wt 323.8 lb

## 2023-01-25 DIAGNOSIS — M25561 Pain in right knee: Secondary | ICD-10-CM | POA: Insufficient documentation

## 2023-01-25 DIAGNOSIS — R2689 Other abnormalities of gait and mobility: Secondary | ICD-10-CM | POA: Insufficient documentation

## 2023-01-25 DIAGNOSIS — G8929 Other chronic pain: Secondary | ICD-10-CM | POA: Diagnosis not present

## 2023-01-25 DIAGNOSIS — E1169 Type 2 diabetes mellitus with other specified complication: Secondary | ICD-10-CM

## 2023-01-25 DIAGNOSIS — M6281 Muscle weakness (generalized): Secondary | ICD-10-CM | POA: Insufficient documentation

## 2023-01-25 DIAGNOSIS — R1033 Periumbilical pain: Secondary | ICD-10-CM

## 2023-01-25 DIAGNOSIS — R21 Rash and other nonspecific skin eruption: Secondary | ICD-10-CM

## 2023-01-25 DIAGNOSIS — M25551 Pain in right hip: Secondary | ICD-10-CM | POA: Diagnosis not present

## 2023-01-25 DIAGNOSIS — Z7985 Long-term (current) use of injectable non-insulin antidiabetic drugs: Secondary | ICD-10-CM | POA: Diagnosis not present

## 2023-01-25 DIAGNOSIS — M170 Bilateral primary osteoarthritis of knee: Secondary | ICD-10-CM

## 2023-01-25 DIAGNOSIS — M25562 Pain in left knee: Secondary | ICD-10-CM | POA: Insufficient documentation

## 2023-01-25 MED ORDER — DULOXETINE HCL 20 MG PO CPEP
20.0000 mg | ORAL_CAPSULE | Freq: Every day | ORAL | 3 refills | Status: DC
Start: 2023-01-25 — End: 2023-03-04

## 2023-01-25 MED ORDER — NYSTATIN 100000 UNIT/GM EX POWD
1.0000 | Freq: Two times a day (BID) | CUTANEOUS | 2 refills | Status: DC
Start: 2023-01-25 — End: 2023-09-19

## 2023-01-25 MED ORDER — TRAMADOL HCL 50 MG PO TABS
50.0000 mg | ORAL_TABLET | Freq: Two times a day (BID) | ORAL | 0 refills | Status: DC | PRN
Start: 2023-01-25 — End: 2023-02-22

## 2023-01-25 NOTE — Patient Instructions (Signed)
Good to see you today - Thank you for coming in  Things we discussed today:  1) Your tramadol, gabapentin, methocarbamol can cause dizziness and drowsiness if you take them together. You can try and if you don't have any side effects, then you can take them together. - Try to limit how often you use your meloxicam. It can irritate your stomach.  2) Come back in 1 month to recheck your A1c. You are next due for an A1c after 02/19/2023.

## 2023-01-25 NOTE — Therapy (Signed)
OUTPATIENT PHYSICAL THERAPY TREATMENT NOTE   Patient Name: Rita Bass MRN: [id number] DOB:01-25-1966, 57 y.o., female Today's Date: 01/25/2023  END OF SESSION:  PT End of Session - 01/25/23 0907     Visit Number 5    Number of Visits 9    Date for PT Re-Evaluation 02/19/23    Authorization Type [employer]    Authorization Time Period Approved 5 visits 12/20/22-02/17/23    Authorization - Visit Number 2    Authorization - Number of Visits 5    PT Start Time 0905   arrived late   PT Stop Time 0932    PT Time Calculation (min) 27 min    Activity Tolerance Patient limited by pain    Behavior During Therapy [facility] for tasks assessed/performed                Past Medical History:  Diagnosis Date   Arthritis    Asthma    Chronic pain of left knee    Complication of anesthesia    PONV   Diabetes (HCC)    GERD (gastroesophageal reflux disease)    H/O bronchitis    Hepatic steatosis    Internal hemorrhoids    Kidney stones    Obesity    Renal disorder    Shortness of breath dyspnea    Sleep apnea    past issues - at weight over 400lbs   Sleep apnea in adult    Polysomnogram pending.  Followed by Dr. Craige Cotta   Past Surgical History:  Procedure Laterality Date   BIOPSY  11/01/2020   Procedure: BIOPSY;  Surgeon: Shellia Cleverly, DO;  Location: [facility];  Service: Gastroenterology;;   BIOPSY  05/11/2021   Procedure: BIOPSY;  Surgeon: Shellia Cleverly, DO;  Location: [facility];  Service: Gastroenterology;;   BIOPSY  11/29/2022   Procedure: BIOPSY;  Surgeon: Shellia Cleverly, DO;  Location: [facility];  Service: Gastroenterology;;   CESAREAN SECTION     6 c-sections   CHOLECYSTECTOMY     COLONOSCOPY WITH PROPOFOL N/A 05/11/2021   Procedure: COLONOSCOPY WITH PROPOFOL;  Surgeon: Shellia Cleverly, DO;  Location: [facility];  Service: Gastroenterology;  Laterality: N/A;   ESOPHAGOGASTRODUODENOSCOPY (EGD) WITH PROPOFOL N/A 11/01/2020    Procedure: ESOPHAGOGASTRODUODENOSCOPY (EGD) WITH PROPOFOL;  Surgeon: Shellia Cleverly, DO;  Location: [facility];  Service: Gastroenterology;  Laterality: N/A;   ESOPHAGOGASTRODUODENOSCOPY (EGD) WITH PROPOFOL N/A 11/29/2022   Procedure: ESOPHAGOGASTRODUODENOSCOPY (EGD) WITH PROPOFOL;  Surgeon: Shellia Cleverly, DO;  Location: [facility];  Service: Gastroenterology;  Laterality: N/A;   HERNIA REPAIR     KIDNEY STONE SURGERY     OOPHORECTOMY     TRANSTHORACIC ECHOCARDIOGRAM  09/2017   Technically difficult study.  Did not use Definity contrast.  EF was 55 to 60% with moderate LVH and grade 1 diastolic dysfunction.  No significant valvular lesions noted.  No regional wall motion normality but difficult to assess due to poor imaging.    TUBAL LIGATION     UPPER GASTROINTESTINAL ENDOSCOPY     Patient Active Problem List   Diagnosis Date Noted   Chronic abdominal pain 12/24/2022   Medication management 12/21/2022   Chronic Helicobacter pylori gastritis 11/29/2022   Gastric ulcer without hemorrhage or perforation 11/29/2022   Gastroesophageal reflux disease with esophagitis without hemorrhage 11/29/2022   Change in bowel habits 11/29/2022   Diarrhea 11/29/2022   Asthma 10/22/2022   Primary osteoarthritis of both knees 10/03/2022   Wound check,  abscess 07/21/2022   Type 2 diabetes mellitus with diabetic neuropathy, unspecified (HCC) 04/30/2022   Chronic osteoarthritis 04/30/2022   Rash 01/15/2022   External hemorrhoid 09/15/2021   Generalized abdominal pain 08/30/2021   Ventral hernia 08/16/2021   Allergic conjunctivitis 07/28/2021   Grade II internal hemorrhoids    H. pylori infection 11/08/2020   Gastritis and gastroduodenitis    Lung nodule 10/26/2020   Ventral hernia without obstruction or gangrene 10/04/2020   Chronic diarrhea 09/16/2020   Abnormal Pap smear of cervix 08/18/2020   Hyperlipidemia associated with type 2 diabetes mellitus (HCC) 06/21/2020   Right hip pain  06/21/2020   Orthopnea 01/04/2020   Chronic cough 12/23/2019   Dyspepsia 05/22/2019   Essential hypertension 04/07/2019   Vaginal bleeding 01/27/2019   Low back pain 01/27/2019   BMI 50.0-59.9, adult (HCC) 12/05/2017   Diastolic dysfunction with heart failure (HCC) 10/25/2017   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   Abscess 10/25/2017   Diabetic nephropathy associated with type 2 diabetes mellitus (HCC) 10/25/2017   Type 2 diabetes mellitus with complication (HCC) 09/13/2017   Sleep apnea 08/16/2017   Chronic pain of right knee 07/13/2016   Morbid obesity (HCC) 02/04/2015   Menorrhagia with regular cycle 11/18/2014    PCP: Lincoln Brigham, MD  REFERRING PROVIDER: Nestor Ramp, MD   REFERRING DIAG: M17.9 (ICD-10-CM) - Osteoarthritis of knee, unspecified laterality, unspecified osteoarthritis type   THERAPY DIAG:  Chronic pain of right knee  Chronic pain of left knee  Pain in right hip  Other abnormalities of gait and mobility  Muscle weakness (generalized)  Rationale for Evaluation and Treatment: Rehabilitation  ONSET DATE: Chronic  SUBJECTIVE:   SUBJECTIVE STATEMENT: Pt presents to PT with reports of continued severe pain. Says she has been fairly compliant with HEP.   PERTINENT HISTORY: DM II, HTN  PAIN:  Are you having pain?  Yes: NPRS scale: 6/10 Worst: 8/10 Pain location: bilateral knees,  Pain description: sharp, achy Aggravating factors: walking, stairs,  Relieving factors: medication, rest, injections  PRECAUTIONS: None  RED FLAGS: None   WEIGHT BEARING RESTRICTIONS: No  FALLS:  Has patient fallen in last 6 months? No  LIVING ENVIRONMENT: Lives with: lives with their family Lives in: House/apartment Stairs: Yes: External: 3 steps; none Has following equipment at home: None  OCCUPATION: Not currently working  PLOF: Independent  PATIENT GOALS: decrease knee pain, improve comfort with walking, lose weight  NEXT MD VISIT: 10/22/22  OBJECTIVE:    DIAGNOSTIC FINDINGS: See imaging   PATIENT SURVEYS:  FOTO: 39% function; 54% predicted  12/20/22 42%  COGNITION: Overall cognitive status: Within functional limits for tasks assessed     SENSATION: WFL  POSTURE: genu valgum and large body habitus  PALPATION: Patellar hypomobility, TTP to distal quad  LOWER EXTREMITY ROM:  Active ROM Right eval Left eval R 12/20/22 L 12/20/22  Knee flexion 92 95 94d 116d  Knee extension 7 4 -4d 0d   (Blank rows = not tested)  LOWER EXTREMITY MMT:  MMT Right eval Left eval  Hip flexion 3+/5 3+/5  Hip extension    Hip abduction 3+/5 3+/5  Hip adduction    Hip internal rotation    Hip external rotation    Knee flexion 3+/5 3+/5  Knee extension 3+/5 3+/5  Ankle dorsiflexion    Ankle plantarflexion    Ankle inversion    Ankle eversion     (Blank rows = not tested)  LOWER EXTREMITY SPECIAL TESTS:  DNT  FUNCTIONAL TESTS:  30  Second Sit to Stand: 7 reps with UE 10//3/24 13 stands UE assist from lap  GAIT: Distance walked: 64ft Assistive device utilized: None Level of assistance: Complete Independence Comments: antalgic gait, wide BoS   TREATMENT: [facility] Adult PT Treatment:                                                DATE: 01/25/23 Therapeutic Exercise: NuStep lvl 6 UE/LE x 3 min while taking subjective FAQ with ball 3x10 STS no UE 3x5 - low table Seated march 3x30 4# Standing hip abd 2x10 each McConnell taping to R knee with medial glide  [facility] Adult PT Treatment:                                                DATE: 01/14/23 Therapeutic Exercise: NuStep lvl 5 UE/LE x 3 min while taking subjective FAQ with ball 3x10 Supine SLR 2x10 - R difficult Bridge with ball 3x10 Supine clamshell 2x15 black band Supine march 2x20 black band STS no UE 3x5  [facility] Adult PT Treatment:                                                DATE: 12/20/22 Therapeutic Exercise: 10x STS FAQs 10/10 SLR 10/10 Supine hip fallouts GTB 10x B,  10/10 unilaterally S/L clams GTB 10/10 SAQs 10/10  [facility] Adult PT Treatment:                                                DATE: 10/31/22 Therapeutic Exercise: Nustep level 5 x 5 mins LE only LAQ 2x10 2.5#  Seated marching 2.5# 2x30" Seated clamshell GTB 2x15  PATIENT EDUCATION:  Education details: HEP update Person educated: Patient Education method: Explanation, Demonstration, and Handouts Education comprehension: verbalized understanding and returned demonstration  HOME EXERCISE PROGRAM: Access Code: [email or web address] URL: [email or web address] Date: 01/25/2023 Prepared by: Edwinna Areola  Exercises - Active Straight Leg Raise with Quad Set  - 1 x daily - 7 x weekly - 2 sets - 10 reps - Seated Long Arc Quad  - 1 x daily - 7 x weekly - 3 sets - 10 reps - Hooklying Single Leg Bent Knee Fallouts with Resistance  - 1 x daily - 7 x weekly - 3 sets - 10 reps - Supine Knee Extension Strengthening  - 1 x daily - 7 x weekly - 3 sets - 10 reps - Standing Hip Abduction with Counter Support  - 1 x daily - 7 x weekly - 3 sets - 10 reps  ASSESSMENT:  CLINICAL IMPRESSION: Pt was able to complete all prescribed exercises, continues to be limited by significant knee pain. Therapy focused exercises on improving quad and proximal hip strength for decreasing knee pain and improving functional mobility. HEP updated for lateral hip strengtheing. Seemed to respond fairly well to McConnell taping to R knee. Pt continues to need skilled PT for improving strength, will continue per POC.   OBJECTIVE IMPAIRMENTS:  Abnormal gait, decreased mobility, difficulty walking, decreased ROM, decreased strength, and pain.   ACTIVITY LIMITATIONS: standing, squatting, stairs, transfers, and locomotion level  PARTICIPATION LIMITATIONS: driving, shopping, community activity, and yard work  PERSONAL FACTORS: Fitness and 1-2 comorbidities: DM II, HTN  are also affecting patient's functional outcome.     GOALS: Goals reviewed with patient? No  SHORT TERM GOALS: Target date: 11/06/2022   Pt will be compliant and knowledgeable with initial HEP for improved comfort and carryover Baseline: initial HEP given  Goal status: Partially met  2.  Pt will self report bilateral knee pain no greater than 6/10 for improved comfort and functional ability Baseline: 8/10 at worst; 12/20/22 6/10 Goal status: Met  LONG TERM GOALS: Target date: 02/19/2023   Pt will improve FOTO function score to no less than 54% as proxy for functional improvement Baseline: 39% function; 12/19/22 41% Goal status: INITIAL  2.  Pt will self report bilateral knee pain no greater than 3/10 for improved comfort and functional ability Baseline: 8/10 at worst Goal status: INITIAL   3.  Pt will increase 30 Second Sit to Stand rep count to no less than 9 reps for improved balance, strength, and functional mobility Baseline: 7 reps with UE; 12/20/22 13 reps Goal status: Met  4.  Pt will improve bilateral knee AROM to no less than 3-110 degrees for improved comfort and functional mobility with stairs and other community navigatoin Baseline: see ROM chart Goal status: INITIAL   PLAN:  PT FREQUENCY: 2x/week  PT DURATION: 8 weeks  PLANNED INTERVENTIONS: Therapeutic exercises, Therapeutic activity, Neuromuscular re-education, Balance training, Gait training, Patient/Family education, Self Care, Joint mobilization, Aquatic Therapy, Dry Needling, Electrical stimulation, Cryotherapy, Moist heat, Vasopneumatic device, Manual therapy, and Re-evaluation  PLAN FOR NEXT SESSION: assess HEP response, progress quad and proximal hip strength  Check all possible CPT codes: 24401 - PT Re-evaluation, 97110- Therapeutic Exercise, [phone]- Neuro Re-education, [phone] - Gait Training, [phone] - Manual Therapy, 97530 - Therapeutic Activities, [phone] - Self Care, and [phone] - Vaso    Check all conditions that are expected to impact treatment:  {Conditions expected to impact treatment:Diabetes mellitus and Musculoskeletal disorders   If treatment provided at initial evaluation, no treatment charged due to lack of authorization.       Eloy End, PT 01/25/2023, 9:39 AM

## 2023-01-25 NOTE — Progress Notes (Unsigned)
    SUBJECTIVE:   CHIEF COMPLAINT / HPI:   DM is a 57yo F w/ hx of T2DM, BL knee OA, hy pylori that p/f OA management.  T2DM - Reports was having issues getting ozempic due to insurance issues. - Just got the ozempic today and plans to start this again.  - Had been off ozempic for about a month  OA of BL Knees - Was also started on meloxicam and methocarbamol for pain by sports medicine doctor.  - Also taking tramadol and gabapentin from me - Reports that the addition of meloxicam and methocarbamol was helpful for her pain - Also getting knee injections by sports medicine - Pt is worried about taking all these meds  - Reports that sports medicine was also thinking about doing nerve block - Continues to work with PT.   H pylori - Currently undergoing treatment for H pylori with infectious disease. ID also worried about potential cancer, so sending her to [facility] to get specialized biopsies.   Breast Rash - Reports recurrence of rash under her breasts. Requesting refill of nystatin  OBJECTIVE:   BP 127/87   Pulse 85   Ht 5\' 4"  (1.626 m)   Wt (!) 323 lb 12.8 oz (146.9 kg)   LMP 04/05/2016   SpO2 99%   BMI 55.58 kg/m   General: Alert, pleasant woman. NAD. HEENT: NCAT. MMM. CV: RRR, no murmurs.  Resp: CTAB, no wheezing or crackles. Normal WOB on RA.  Abm: Diffuse discomfort to palpation. Soft, nondistended. BS present. Ext: Moves all ext spontaneously Skin: Warm, well perfused    ASSESSMENT/PLAN:    Assessment & Plan Rash Recurrence of candida intertrigo, refill provided for nystatin powder.  Osteoarthritis of both knees, unspecified osteoarthritis type Better control with addition of methocarbamol and meloxicam. Advised to be cautious of drowsiness and dizziness of pain meds. Advised to use meloxicam minimally due to GI side effects and current H pylori infxn. - Cont methocarbamol, meloxicam, tramadol, and gabapentin Type 2 diabetes mellitus with other specified  complication, unspecified whether long term insulin use (HCC) Recently restarted ozempic after having trouble getting it covered and then pharmacy being out of stock.  - A1c in 1 month    Lincoln Brigham, MD [facility]

## 2023-01-27 NOTE — Assessment & Plan Note (Signed)
Recurrence of candida intertrigo, refill provided for nystatin powder.

## 2023-01-28 ENCOUNTER — Ambulatory Visit: Payer: Medicaid Other

## 2023-02-04 ENCOUNTER — Ambulatory Visit: Payer: Medicaid Other

## 2023-02-04 DIAGNOSIS — M25562 Pain in left knee: Secondary | ICD-10-CM | POA: Diagnosis not present

## 2023-02-04 DIAGNOSIS — G8929 Other chronic pain: Secondary | ICD-10-CM

## 2023-02-04 DIAGNOSIS — M6281 Muscle weakness (generalized): Secondary | ICD-10-CM | POA: Diagnosis not present

## 2023-02-04 DIAGNOSIS — M25561 Pain in right knee: Secondary | ICD-10-CM | POA: Diagnosis not present

## 2023-02-04 DIAGNOSIS — M25551 Pain in right hip: Secondary | ICD-10-CM | POA: Diagnosis not present

## 2023-02-04 DIAGNOSIS — R2689 Other abnormalities of gait and mobility: Secondary | ICD-10-CM | POA: Diagnosis not present

## 2023-02-04 NOTE — Therapy (Signed)
OUTPATIENT PHYSICAL THERAPY TREATMENT NOTE   Patient Name: Rita Bass MRN: [id number] DOB:[birth year], 57 y.o., female Today's Date: 02/04/2023  END OF SESSION:  PT End of Session - 02/04/23 0928     Visit Number 6    Number of Visits 9    Date for PT Re-Evaluation 02/19/23    Authorization Type [place] [employer]    Authorization Time Period Approved 5 visits 12/20/22-02/17/23    Authorization - Visit Number 3    Authorization - Number of Visits 5    PT Start Time 0930    PT Stop Time 1008    PT Time Calculation (min) 38 min    Activity Tolerance Patient limited by pain    Behavior During Therapy WFL for tasks assessed/performed                 Past Medical History:  Diagnosis Date   Arthritis    Asthma    Chronic pain of left knee    Complication of anesthesia    PONV   Diabetes (HCC)    GERD (gastroesophageal reflux disease)    H/O bronchitis    Hepatic steatosis    Internal hemorrhoids    Kidney stones    Obesity    Renal disorder    Shortness of breath dyspnea    Sleep apnea    past issues - at weight over 400lbs   Sleep apnea in adult    Polysomnogram pending.  Followed by Dr. Craige Cotta   Past Surgical History:  Procedure Laterality Date   BIOPSY  11/01/2020   Procedure: BIOPSY;  Surgeon: Shellia Cleverly, DO;  Location: [facility];  Service: Gastroenterology;;   BIOPSY  05/11/2021   Procedure: BIOPSY;  Surgeon: Shellia Cleverly, DO;  Location: [facility];  Service: Gastroenterology;;   BIOPSY  11/29/2022   Procedure: BIOPSY;  Surgeon: Shellia Cleverly, DO;  Location: [facility];  Service: Gastroenterology;;   CESAREAN SECTION     6 c-sections   CHOLECYSTECTOMY     COLONOSCOPY WITH PROPOFOL N/A 05/11/2021   Procedure: COLONOSCOPY WITH PROPOFOL;  Surgeon: Shellia Cleverly, DO;  Location: [facility];  Service: Gastroenterology;  Laterality: N/A;   ESOPHAGOGASTRODUODENOSCOPY (EGD) WITH PROPOFOL N/A 11/01/2020   Procedure:  ESOPHAGOGASTRODUODENOSCOPY (EGD) WITH PROPOFOL;  Surgeon: Shellia Cleverly, DO;  Location: [facility];  Service: Gastroenterology;  Laterality: N/A;   ESOPHAGOGASTRODUODENOSCOPY (EGD) WITH PROPOFOL N/A 11/29/2022   Procedure: ESOPHAGOGASTRODUODENOSCOPY (EGD) WITH PROPOFOL;  Surgeon: Shellia Cleverly, DO;  Location: [facility];  Service: Gastroenterology;  Laterality: N/A;   HERNIA REPAIR     KIDNEY STONE SURGERY     OOPHORECTOMY     TRANSTHORACIC ECHOCARDIOGRAM  09/2017   Technically difficult study.  Did not use Definity contrast.  EF was 55 to 60% with moderate LVH and grade 1 diastolic dysfunction.  No significant valvular lesions noted.  No regional wall motion normality but difficult to assess due to poor imaging.    TUBAL LIGATION     UPPER GASTROINTESTINAL ENDOSCOPY     Patient Active Problem List   Diagnosis Date Noted   Chronic abdominal pain 12/24/2022   Medication management 12/21/2022   Chronic Helicobacter pylori gastritis 11/29/2022   Gastric ulcer without hemorrhage or perforation 11/29/2022   Gastroesophageal reflux disease with esophagitis without hemorrhage 11/29/2022   Change in bowel habits 11/29/2022   Diarrhea 11/29/2022   Asthma 10/22/2022   Primary osteoarthritis of both knees 10/03/2022   Wound check, abscess 07/21/2022  Type 2 diabetes mellitus with diabetic neuropathy, unspecified (HCC) 04/30/2022   Chronic osteoarthritis 04/30/2022   Rash 01/15/2022   External hemorrhoid 09/15/2021   Generalized abdominal pain 08/30/2021   Ventral hernia 08/16/2021   Allergic conjunctivitis 07/28/2021   Grade II internal hemorrhoids    H. pylori infection 11/08/2020   Gastritis and gastroduodenitis    Lung nodule 10/26/2020   Ventral hernia without obstruction or gangrene 10/04/2020   Chronic diarrhea 09/16/2020   Abnormal Pap smear of cervix 08/18/2020   Hyperlipidemia associated with type 2 diabetes mellitus (HCC) 06/21/2020   Right hip pain 06/21/2020    Orthopnea 01/04/2020   Chronic cough 12/23/2019   Dyspepsia 05/22/2019   Essential hypertension 04/07/2019   Vaginal bleeding 01/27/2019   Low back pain 01/27/2019   BMI 50.0-59.9, adult (HCC) 12/05/2017   Diastolic dysfunction with heart failure (HCC) 10/25/2017   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   Abscess 10/25/2017   Diabetic nephropathy associated with type 2 diabetes mellitus (HCC) 10/25/2017   Type 2 diabetes mellitus with complication (HCC) 09/13/2017   Sleep apnea 08/16/2017   Chronic pain of right knee 07/13/2016   Morbid obesity (HCC) 02/04/2015   Menorrhagia with regular cycle 11/18/2014    PCP: Lincoln Brigham, MD  REFERRING PROVIDER: Nestor Ramp, MD   REFERRING DIAG: M17.9 (ICD-10-CM) - Osteoarthritis of knee, unspecified laterality, unspecified osteoarthritis type   THERAPY DIAG:  Chronic pain of right knee  Chronic pain of left knee  Rationale for Evaluation and Treatment: Rehabilitation  ONSET DATE: Chronic  SUBJECTIVE:   SUBJECTIVE STATEMENT: Pt presents to PT with reports of continued significant pain in bilateral knees. Has been compliant with HEP with no adverse effect.   PERTINENT HISTORY: DM II, HTN  PAIN:  Are you having pain?  Yes: NPRS scale: 6/10 Worst: 8/10 Pain location: bilateral knees,  Pain description: sharp, achy Aggravating factors: walking, stairs,  Relieving factors: medication, rest, injections  PRECAUTIONS: None  RED FLAGS: None   WEIGHT BEARING RESTRICTIONS: No  FALLS:  Has patient fallen in last 6 months? No  LIVING ENVIRONMENT: Lives with: lives with their family Lives in: House/apartment Stairs: Yes: External: 3 steps; none Has following equipment at home: None  OCCUPATION: Not currently working  PLOF: Independent  PATIENT GOALS: decrease knee pain, improve comfort with walking, lose weight  NEXT MD VISIT: 10/22/22  OBJECTIVE:   DIAGNOSTIC FINDINGS: See imaging   PATIENT SURVEYS:  FOTO: 39% function; 54%  predicted  12/20/22 42%  COGNITION: Overall cognitive status: Within functional limits for tasks assessed     SENSATION: WFL  POSTURE: genu valgum and large body habitus  PALPATION: Patellar hypomobility, TTP to distal quad  LOWER EXTREMITY ROM:  Active ROM Right eval Left eval R 12/20/22 L 12/20/22  Knee flexion 92 95 94d 116d  Knee extension 7 4 -4d 0d   (Blank rows = not tested)  LOWER EXTREMITY MMT:  MMT Right eval Left eval  Hip flexion 3+/5 3+/5  Hip extension    Hip abduction 3+/5 3+/5  Hip adduction    Hip internal rotation    Hip external rotation    Knee flexion 3+/5 3+/5  Knee extension 3+/5 3+/5  Ankle dorsiflexion    Ankle plantarflexion    Ankle inversion    Ankle eversion     (Blank rows = not tested)  LOWER EXTREMITY SPECIAL TESTS:  DNT  FUNCTIONAL TESTS:  30 Second Sit to Stand: 7 reps with UE 10//3/24 13 stands UE assist from lap  GAIT: Distance walked: 14ft Assistive device utilized: None Level of assistance: Complete Independence Comments: antalgic gait, wide BoS   TREATMENT: [facility] Adult PT Treatment:                                                DATE: 02/04/23 Therapeutic Exercise: NuStep lvl 5 UE/LE x 5 min while taking subjective FAQ with ball 3x10 2.5# Seated march 3x20 2.5# Supine QS x 10 - 5" hold Supine SLR 3x5 each Seated hamstring curl 2x15 black band Standing hip abd 2x10 each STS no UE 3x5 - low table Seated hamstring stretch 2x30" each Seated heel slide x 10 - 5" hold  [facility] Adult PT Treatment:                                                DATE: 01/25/23 Therapeutic Exercise: NuStep lvl 6 UE/LE x 3 min while taking subjective FAQ with ball 3x10 STS no UE 3x5 - low table Seated march 3x30 4# Standing hip abd 2x10 each McConnell taping to R knee with medial glide  [facility] Adult PT Treatment:                                                DATE: 01/14/23 Therapeutic Exercise: NuStep lvl 5 UE/LE x 3 min while  taking subjective FAQ with ball 3x10 Supine SLR 2x10 - R difficult Bridge with ball 3x10 Supine clamshell 2x15 black band Supine march 2x20 black band STS no UE 3x5  [facility] Adult PT Treatment:                                                DATE: 12/20/22 Therapeutic Exercise: 10x STS FAQs 10/10 SLR 10/10 Supine hip fallouts GTB 10x B, 10/10 unilaterally S/L clams GTB 10/10 SAQs 10/10  [facility] Adult PT Treatment:                                                DATE: 10/31/22 Therapeutic Exercise: Nustep level 5 x 5 mins LE only LAQ 2x10 2.5#  Seated marching 2.5# 2x30" Seated clamshell GTB 2x15  PATIENT EDUCATION:  Education details: HEP update Person educated: Patient Education method: Explanation, Demonstration, and Handouts Education comprehension: verbalized understanding and returned demonstration  HOME EXERCISE PROGRAM: Access Code: [email or web address] URL: [email or web address] Date: 01/25/2023 Prepared by: Edwinna Areola  Exercises - Active Straight Leg Raise with Quad Set  - 1 x daily - 7 x weekly - 2 sets - 10 reps - Seated Long Arc Quad  - 1 x daily - 7 x weekly - 3 sets - 10 reps - Hooklying Single Leg Bent Knee Fallouts with Resistance  - 1 x daily - 7 x weekly - 3 sets - 10 reps - Supine Knee Extension Strengthening  - 1 x daily - 7 x weekly -  3 sets - 10 reps - Standing Hip Abduction with Counter Support  - 1 x daily - 7 x weekly - 3 sets - 10 reps  ASSESSMENT:  CLINICAL IMPRESSION: Pt was able to complete all prescribed exercises, continues to be limited by significant knee pain. Therapy focused exercises on improving quad and proximal hip strength for decreasing knee pain and improving functional mobility. Pt continues to need skilled PT for improving strength, will continue per POC.   OBJECTIVE IMPAIRMENTS: Abnormal gait, decreased mobility, difficulty walking, decreased ROM, decreased strength, and pain.   ACTIVITY LIMITATIONS: standing, squatting, stairs,  transfers, and locomotion level  PARTICIPATION LIMITATIONS: driving, shopping, community activity, and yard work  PERSONAL FACTORS: Fitness and 1-2 comorbidities: DM II, HTN  are also affecting patient's functional outcome.    GOALS: Goals reviewed with patient? No  SHORT TERM GOALS: Target date: 11/06/2022   Pt will be compliant and knowledgeable with initial HEP for improved comfort and carryover Baseline: initial HEP given  Goal status: Partially met  2.  Pt will self report bilateral knee pain no greater than 6/10 for improved comfort and functional ability Baseline: 8/10 at worst; 12/20/22 6/10 Goal status: Met  LONG TERM GOALS: Target date: 02/19/2023   Pt will improve FOTO function score to no less than 54% as proxy for functional improvement Baseline: 39% function; 12/19/22 41% Goal status: INITIAL  2.  Pt will self report bilateral knee pain no greater than 3/10 for improved comfort and functional ability Baseline: 8/10 at worst Goal status: INITIAL   3.  Pt will increase 30 Second Sit to Stand rep count to no less than 9 reps for improved balance, strength, and functional mobility Baseline: 7 reps with UE; 12/20/22 13 reps Goal status: Met  4.  Pt will improve bilateral knee AROM to no less than 3-110 degrees for improved comfort and functional mobility with stairs and other community navigatoin Baseline: see ROM chart Goal status: INITIAL   PLAN:  PT FREQUENCY: 2x/week  PT DURATION: 8 weeks  PLANNED INTERVENTIONS: Therapeutic exercises, Therapeutic activity, Neuromuscular re-education, Balance training, Gait training, Patient/Family education, Self Care, Joint mobilization, Aquatic Therapy, Dry Needling, Electrical stimulation, Cryotherapy, Moist heat, Vasopneumatic device, Manual therapy, and Re-evaluation  PLAN FOR NEXT SESSION: assess HEP response, progress quad and proximal hip strength  Check all possible CPT codes: 51884 - PT Re-evaluation, 97110-  Therapeutic Exercise, [phone]- Neuro Re-education, [phone] - Gait Training, [phone] - Manual Therapy, 97530 - Therapeutic Activities, [phone] - Self Care, and [phone] - Vaso    Check all conditions that are expected to impact treatment: {Conditions expected to impact treatment:Diabetes mellitus and Musculoskeletal disorders   If treatment provided at initial evaluation, no treatment charged due to lack of authorization.       Eloy End, PT 02/04/2023, 10:11 AM

## 2023-02-05 ENCOUNTER — Other Ambulatory Visit: Payer: Self-pay | Admitting: *Deleted

## 2023-02-05 MED ORDER — MELOXICAM 15 MG PO TABS: ORAL_TABLET | ORAL | 0 refills | Status: DC

## 2023-02-11 DIAGNOSIS — Z23 Encounter for immunization: Secondary | ICD-10-CM | POA: Diagnosis not present

## 2023-02-18 ENCOUNTER — Ambulatory Visit: Payer: Medicaid Other | Admitting: Physical Therapy

## 2023-02-20 ENCOUNTER — Ambulatory Visit: Payer: Medicaid Other | Admitting: Physical Therapy

## 2023-02-20 NOTE — Therapy (Incomplete)
OUTPATIENT PHYSICAL THERAPY TREATMENT NOTE   Patient Name: Rita Bass MRN: [id number] DOB:[birth year], 57 y.o., female Today's Date: 02/20/2023  END OF SESSION:        Past Medical History:  Diagnosis Date   Arthritis    Asthma    Chronic pain of left knee    Complication of anesthesia    PONV   Diabetes (HCC)    GERD (gastroesophageal reflux disease)    H/O bronchitis    Hepatic steatosis    Internal hemorrhoids    Kidney stones    Obesity    Renal disorder    Shortness of breath dyspnea    Sleep apnea    past issues - at weight over 400lbs   Sleep apnea in adult    Polysomnogram pending.  Followed by Dr. Craige Cotta   Past Surgical History:  Procedure Laterality Date   BIOPSY  11/01/2020   Procedure: BIOPSY;  Surgeon: Shellia Cleverly, DO;  Location: [facility];  Service: Gastroenterology;;   BIOPSY  05/11/2021   Procedure: BIOPSY;  Surgeon: Shellia Cleverly, DO;  Location: [facility];  Service: Gastroenterology;;   BIOPSY  11/29/2022   Procedure: BIOPSY;  Surgeon: Shellia Cleverly, DO;  Location: [facility];  Service: Gastroenterology;;   CESAREAN SECTION     6 c-sections   CHOLECYSTECTOMY     COLONOSCOPY WITH PROPOFOL N/A 05/11/2021   Procedure: COLONOSCOPY WITH PROPOFOL;  Surgeon: Shellia Cleverly, DO;  Location: [facility];  Service: Gastroenterology;  Laterality: N/A;   ESOPHAGOGASTRODUODENOSCOPY (EGD) WITH PROPOFOL N/A 11/01/2020   Procedure: ESOPHAGOGASTRODUODENOSCOPY (EGD) WITH PROPOFOL;  Surgeon: Shellia Cleverly, DO;  Location: [facility];  Service: Gastroenterology;  Laterality: N/A;   ESOPHAGOGASTRODUODENOSCOPY (EGD) WITH PROPOFOL N/A 11/29/2022   Procedure: ESOPHAGOGASTRODUODENOSCOPY (EGD) WITH PROPOFOL;  Surgeon: Shellia Cleverly, DO;  Location: [facility];  Service: Gastroenterology;  Laterality: N/A;   HERNIA REPAIR     KIDNEY STONE SURGERY     OOPHORECTOMY     TRANSTHORACIC ECHOCARDIOGRAM  09/2017   Technically  difficult study.  Did not use Definity contrast.  EF was 55 to 60% with moderate LVH and grade 1 diastolic dysfunction.  No significant valvular lesions noted.  No regional wall motion normality but difficult to assess due to poor imaging.    TUBAL LIGATION     UPPER GASTROINTESTINAL ENDOSCOPY     Patient Active Problem List   Diagnosis Date Noted   Chronic abdominal pain 12/24/2022   Medication management 12/21/2022   Chronic Helicobacter pylori gastritis 11/29/2022   Gastric ulcer without hemorrhage or perforation 11/29/2022   Gastroesophageal reflux disease with esophagitis without hemorrhage 11/29/2022   Change in bowel habits 11/29/2022   Diarrhea 11/29/2022   Asthma 10/22/2022   Primary osteoarthritis of both knees 10/03/2022   Wound check, abscess 07/21/2022   Type 2 diabetes mellitus with diabetic neuropathy, unspecified (HCC) 04/30/2022   Chronic osteoarthritis 04/30/2022   Rash 01/15/2022   External hemorrhoid 09/15/2021   Generalized abdominal pain 08/30/2021   Ventral hernia 08/16/2021   Allergic conjunctivitis 07/28/2021   Grade II internal hemorrhoids    H. pylori infection 11/08/2020   Gastritis and gastroduodenitis    Lung nodule 10/26/2020   Ventral hernia without obstruction or gangrene 10/04/2020   Chronic diarrhea 09/16/2020   Abnormal Pap smear of cervix 08/18/2020   Hyperlipidemia associated with type 2 diabetes mellitus (HCC) 06/21/2020   Right hip pain 06/21/2020   Orthopnea 01/04/2020   Chronic cough 12/23/2019   Dyspepsia  05/22/2019   Essential hypertension 04/07/2019   Vaginal bleeding 01/27/2019   Low back pain 01/27/2019   BMI 50.0-59.9, adult (HCC) 12/05/2017   Diastolic dysfunction with heart failure (HCC) 10/25/2017   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   Abscess 10/25/2017   Diabetic nephropathy associated with type 2 diabetes mellitus (HCC) 10/25/2017   Type 2 diabetes mellitus with complication (HCC) 09/13/2017   Sleep apnea 08/16/2017   Chronic  pain of right knee 07/13/2016   Morbid obesity (HCC) 02/04/2015   Menorrhagia with regular cycle 11/18/2014    PCP: Lincoln Brigham, MD  REFERRING PROVIDER: Nestor Ramp, MD   REFERRING DIAG: M17.9 (ICD-10-CM) - Osteoarthritis of knee, unspecified laterality, unspecified osteoarthritis type   THERAPY DIAG:  No diagnosis found.  Rationale for Evaluation and Treatment: Rehabilitation  ONSET DATE: Chronic  SUBJECTIVE:   SUBJECTIVE STATEMENT: *** Pt presents to PT with reports of continued significant pain in bilateral knees. Has been compliant with HEP with no adverse effect.   PERTINENT HISTORY: DM II, HTN  PAIN:  Are you having pain?  Yes: NPRS scale: 6/10 Worst: 8/10 Pain location: bilateral knees,  Pain description: sharp, achy Aggravating factors: walking, stairs,  Relieving factors: medication, rest, injections  PRECAUTIONS: None  RED FLAGS: None   WEIGHT BEARING RESTRICTIONS: No  FALLS:  Has patient fallen in last 6 months? No  LIVING ENVIRONMENT: Lives with: lives with their family Lives in: House/apartment Stairs: Yes: External: 3 steps; none Has following equipment at home: None  OCCUPATION: Not currently working  PLOF: Independent  PATIENT GOALS: decrease knee pain, improve comfort with walking, lose weight  NEXT MD VISIT: 10/22/22  OBJECTIVE:   DIAGNOSTIC FINDINGS: See imaging   PATIENT SURVEYS:  FOTO: 39% function; 54% predicted  12/20/22 42%  COGNITION: Overall cognitive status: Within functional limits for tasks assessed     SENSATION: WFL  POSTURE: genu valgum and large body habitus  PALPATION: Patellar hypomobility, TTP to distal quad  LOWER EXTREMITY ROM:  Active ROM Right eval Left eval R 12/20/22 L 12/20/22  Knee flexion 92 95 94d 116d  Knee extension 7 4 -4d 0d   (Blank rows = not tested)  LOWER EXTREMITY MMT:  MMT Right eval Left eval  Hip flexion 3+/5 3+/5  Hip extension    Hip abduction 3+/5 3+/5  Hip  adduction    Hip internal rotation    Hip external rotation    Knee flexion 3+/5 3+/5  Knee extension 3+/5 3+/5  Ankle dorsiflexion    Ankle plantarflexion    Ankle inversion    Ankle eversion     (Blank rows = not tested)  LOWER EXTREMITY SPECIAL TESTS:  DNT  FUNCTIONAL TESTS:  30 Second Sit to Stand: 7 reps with UE 10//3/24 13 stands UE assist from lap  GAIT: Distance walked: 78ft Assistive device utilized: None Level of assistance: Complete Independence Comments: antalgic gait, wide BoS   TREATMENT: [facility] Adult PT Treatment:                                                DATE: 02/20/23 Therapeutic Exercise: NuStep lvl 5 UE/LE x 5 min while taking subjective FAQ with ball 3x10 2.5# Seated march 3x20 2.5# Supine QS x 10 - 5" hold Supine SLR 3x5 each Seated hamstring curl 2x15 black band Standing hip abd 2x10 each STS no UE 3x5 -  low table Seated hamstring stretch 2x30" each Seated heel slide x 10 - 5" hold  [facility] Adult PT Treatment:                                                DATE: 02/04/23 Therapeutic Exercise: NuStep lvl 5 UE/LE x 5 min while taking subjective FAQ with ball 3x10 2.5# Seated march 3x20 2.5# Supine QS x 10 - 5" hold Supine SLR 3x5 each Seated hamstring curl 2x15 black band Standing hip abd 2x10 each STS no UE 3x5 - low table Seated hamstring stretch 2x30" each Seated heel slide x 10 - 5" hold  [facility] Adult PT Treatment:                                                DATE: 01/25/23 Therapeutic Exercise: NuStep lvl 6 UE/LE x 3 min while taking subjective FAQ with ball 3x10 STS no UE 3x5 - low table Seated march 3x30 4# Standing hip abd 2x10 each McConnell taping to R knee with medial glide  [facility] Adult PT Treatment:                                                DATE: 01/14/23 Therapeutic Exercise: NuStep lvl 5 UE/LE x 3 min while taking subjective FAQ with ball 3x10 Supine SLR 2x10 - R difficult Bridge with ball 3x10 Supine clamshell  2x15 black band Supine march 2x20 black band STS no UE 3x5  [facility] Adult PT Treatment:                                                DATE: 12/20/22 Therapeutic Exercise: 10x STS FAQs 10/10 SLR 10/10 Supine hip fallouts GTB 10x B, 10/10 unilaterally S/L clams GTB 10/10 SAQs 10/10  [facility] Adult PT Treatment:                                                DATE: 10/31/22 Therapeutic Exercise: Nustep level 5 x 5 mins LE only LAQ 2x10 2.5#  Seated marching 2.5# 2x30" Seated clamshell GTB 2x15  PATIENT EDUCATION:  Education details: HEP update Person educated: Patient Education method: Explanation, Demonstration, and Handouts Education comprehension: verbalized understanding and returned demonstration  HOME EXERCISE PROGRAM: Access Code: [email or web address] URL: [email or web address] Date: 01/25/2023 Prepared by: Edwinna Areola  Exercises - Active Straight Leg Raise with Quad Set  - 1 x daily - 7 x weekly - 2 sets - 10 reps - Seated Long Arc Quad  - 1 x daily - 7 x weekly - 3 sets - 10 reps - Hooklying Single Leg Bent Knee Fallouts with Resistance  - 1 x daily - 7 x weekly - 3 sets - 10 reps - Supine Knee Extension Strengthening  - 1 x daily - 7 x weekly - 3 sets - 10  reps - Standing Hip Abduction with Counter Support  - 1 x daily - 7 x weekly - 3 sets - 10 reps  ASSESSMENT:  CLINICAL IMPRESSION: Pt was able to complete all prescribed exercises, continues to be limited by significant knee pain. Therapy focused exercises on improving quad and proximal hip strength for decreasing knee pain and improving functional mobility. Pt continues to need skilled PT for improving strength, will continue per POC.   OBJECTIVE IMPAIRMENTS: Abnormal gait, decreased mobility, difficulty walking, decreased ROM, decreased strength, and pain.   ACTIVITY LIMITATIONS: standing, squatting, stairs, transfers, and locomotion level  PARTICIPATION LIMITATIONS: driving, shopping, community activity, and yard  work  PERSONAL FACTORS: Fitness and 1-2 comorbidities: DM II, HTN  are also affecting patient's functional outcome.    GOALS: Goals reviewed with patient? No  SHORT TERM GOALS: Target date: 11/06/2022   Pt will be compliant and knowledgeable with initial HEP for improved comfort and carryover Baseline: initial HEP given  Goal status: Partially met  2.  Pt will self report bilateral knee pain no greater than 6/10 for improved comfort and functional ability Baseline: 8/10 at worst; 12/20/22 6/10 Goal status: Met  LONG TERM GOALS: Target date: 02/19/2023   Pt will improve FOTO function score to no less than 54% as proxy for functional improvement Baseline: 39% function; 12/19/22 41% Goal status: INITIAL  2.  Pt will self report bilateral knee pain no greater than 3/10 for improved comfort and functional ability Baseline: 8/10 at worst Goal status: INITIAL   3.  Pt will increase 30 Second Sit to Stand rep count to no less than 9 reps for improved balance, strength, and functional mobility Baseline: 7 reps with UE; 12/20/22 13 reps Goal status: Met  4.  Pt will improve bilateral knee AROM to no less than 3-110 degrees for improved comfort and functional mobility with stairs and other community navigatoin Baseline: see ROM chart Goal status: INITIAL   PLAN:  PT FREQUENCY: 2x/week  PT DURATION: 8 weeks  PLANNED INTERVENTIONS: Therapeutic exercises, Therapeutic activity, Neuromuscular re-education, Balance training, Gait training, Patient/Family education, Self Care, Joint mobilization, Aquatic Therapy, Dry Needling, Electrical stimulation, Cryotherapy, Moist heat, Vasopneumatic device, Manual therapy, and Re-evaluation  PLAN FOR NEXT SESSION: assess HEP response, progress quad and proximal hip strength  Check all possible CPT codes: 16109 - PT Re-evaluation, 97110- Therapeutic Exercise, [phone]- Neuro Re-education, [phone] - Gait Training, [phone] - Manual Therapy, 97530 - Therapeutic  Activities, [phone] - Self Care, and [phone] - Vaso    Check all conditions that are expected to impact treatment: {Conditions expected to impact treatment:Diabetes mellitus and Musculoskeletal disorders   If treatment provided at initial evaluation, no treatment charged due to lack of authorization.       Eloy End, PT 02/20/2023, 7:50 AM

## 2023-02-22 ENCOUNTER — Other Ambulatory Visit: Payer: Self-pay

## 2023-02-22 DIAGNOSIS — R1033 Periumbilical pain: Secondary | ICD-10-CM

## 2023-02-25 ENCOUNTER — Ambulatory Visit: Payer: Medicaid Other | Admitting: Family Medicine

## 2023-02-25 MED ORDER — ALBUTEROL SULFATE (5 MG/ML) 0.5% IN NEBU: 2.5000 mg | INHALATION_SOLUTION | Freq: Four times a day (QID) | RESPIRATORY_TRACT | 0 refills | Status: AC | PRN

## 2023-02-25 MED ORDER — TRAMADOL HCL 50 MG PO TABS
50.0000 mg | ORAL_TABLET | Freq: Two times a day (BID) | ORAL | 0 refills | Status: DC | PRN
Start: 2023-02-25 — End: 2023-03-27

## 2023-02-26 ENCOUNTER — Other Ambulatory Visit: Payer: Self-pay | Admitting: Gastroenterology

## 2023-02-26 ENCOUNTER — Ambulatory Visit: Payer: Medicaid Other | Attending: Family Medicine

## 2023-02-26 DIAGNOSIS — R2689 Other abnormalities of gait and mobility: Secondary | ICD-10-CM | POA: Insufficient documentation

## 2023-02-26 DIAGNOSIS — G8929 Other chronic pain: Secondary | ICD-10-CM | POA: Insufficient documentation

## 2023-02-26 DIAGNOSIS — M25562 Pain in left knee: Secondary | ICD-10-CM | POA: Diagnosis not present

## 2023-02-26 DIAGNOSIS — M25551 Pain in right hip: Secondary | ICD-10-CM | POA: Diagnosis not present

## 2023-02-26 DIAGNOSIS — M25561 Pain in right knee: Secondary | ICD-10-CM | POA: Diagnosis not present

## 2023-02-26 DIAGNOSIS — M6281 Muscle weakness (generalized): Secondary | ICD-10-CM | POA: Insufficient documentation

## 2023-02-26 NOTE — Therapy (Addendum)
 OUTPATIENT PHYSICAL THERAPY TREATMENT NOTE/DISCHARGE  PHYSICAL THERAPY DISCHARGE SUMMARY  Visits from Start of Care: 7  Current functional level related to goals / functional outcomes: See goals/objective   Remaining deficits: Unable to assess   Education / Equipment: HEP   Patient agrees to discharge. Patient goals were unable to assess. Patient is being discharged due to not returning since the last visit.     Patient Name: Rita Bass MRN: [id number] DOB:[birth year], 57 y.o., female Today's Date: 02/26/2023  END OF SESSION:  PT End of Session - 02/26/23 1159     Visit Number 7    Number of Visits 9    Date for PT Re-Evaluation 02/19/23    Authorization Type [employer]    Authorization Time Period Approved 5 visits 12/20/22-02/17/23    Authorization - Number of Visits 5    PT Start Time 1157   arrived late   PT Stop Time 1227    PT Time Calculation (min) 30 min    Activity Tolerance Patient limited by pain    Behavior During Therapy [facility] for tasks assessed/performed                  Past Medical History:  Diagnosis Date   Arthritis    Asthma    Chronic pain of left knee    Complication of anesthesia    PONV   Diabetes (HCC)    GERD (gastroesophageal reflux disease)    H/O bronchitis    Hepatic steatosis    Internal hemorrhoids    Kidney stones    Obesity    Renal disorder    Shortness of breath dyspnea    Sleep apnea    past issues - at weight over 400lbs   Sleep apnea in adult    Polysomnogram pending.  Followed by Dr. Shellia   Past Surgical History:  Procedure Laterality Date   BIOPSY  11/01/2020   Procedure: BIOPSY;  Surgeon: San Sandor GAILS, DO;  Location: [facility];  Service: Gastroenterology;;   BIOPSY  05/11/2021   Procedure: BIOPSY;  Surgeon: San Sandor GAILS, DO;  Location: [facility];  Service: Gastroenterology;;   BIOPSY  11/29/2022   Procedure: BIOPSY;  Surgeon: San Sandor GAILS, DO;  Location: [facility];  Service: Gastroenterology;;   CESAREAN SECTION     6 c-sections   CHOLECYSTECTOMY     COLONOSCOPY WITH PROPOFOL  N/A 05/11/2021   Procedure: COLONOSCOPY WITH PROPOFOL ;  Surgeon: San Sandor GAILS, DO;  Location: [facility];  Service: Gastroenterology;  Laterality: N/A;   ESOPHAGOGASTRODUODENOSCOPY (EGD) WITH PROPOFOL  N/A 11/01/2020   Procedure: ESOPHAGOGASTRODUODENOSCOPY (EGD) WITH PROPOFOL ;  Surgeon: San Sandor GAILS, DO;  Location: [facility];  Service: Gastroenterology;  Laterality: N/A;   ESOPHAGOGASTRODUODENOSCOPY (EGD) WITH PROPOFOL  N/A 11/29/2022   Procedure: ESOPHAGOGASTRODUODENOSCOPY (EGD) WITH PROPOFOL ;  Surgeon: San Sandor GAILS, DO;  Location: [facility];  Service: Gastroenterology;  Laterality: N/A;   HERNIA REPAIR     KIDNEY STONE SURGERY     OOPHORECTOMY     TRANSTHORACIC ECHOCARDIOGRAM  09/2017   Technically difficult study.  Did not use Definity  contrast.  EF was 55 to 60% with moderate LVH and grade 1 diastolic dysfunction.  No significant valvular lesions noted.  No regional wall motion normality but difficult to assess due to poor imaging.    TUBAL LIGATION     UPPER GASTROINTESTINAL ENDOSCOPY     Patient Active Problem List   Diagnosis Date Noted   Chronic abdominal pain 12/24/2022  Medication management 12/21/2022   Chronic Helicobacter pylori gastritis 11/29/2022   Gastric ulcer without hemorrhage or perforation 11/29/2022   Gastroesophageal reflux disease with esophagitis without hemorrhage 11/29/2022   Change in bowel habits 11/29/2022   Diarrhea 11/29/2022   Asthma 10/22/2022   Primary osteoarthritis of both knees 10/03/2022   Wound check, abscess 07/21/2022   Type 2 diabetes mellitus with diabetic neuropathy, unspecified (HCC) 04/30/2022   Chronic osteoarthritis 04/30/2022   Rash 01/15/2022   External hemorrhoid 09/15/2021   Generalized abdominal pain 08/30/2021   Ventral hernia 08/16/2021   Allergic conjunctivitis 07/28/2021    Grade II internal hemorrhoids    H. pylori infection 11/08/2020   Gastritis and gastroduodenitis    Lung nodule 10/26/2020   Ventral hernia without obstruction or gangrene 10/04/2020   Chronic diarrhea 09/16/2020   Abnormal Pap smear of cervix 08/18/2020   Hyperlipidemia associated with type 2 diabetes mellitus (HCC) 06/21/2020   Right hip pain 06/21/2020   Orthopnea 01/04/2020   Chronic cough 12/23/2019   Dyspepsia 05/22/2019   Essential hypertension 04/07/2019   Vaginal bleeding 01/27/2019   Low back pain 01/27/2019   BMI 50.0-59.9, adult (HCC) 12/05/2017   Diastolic dysfunction with heart failure (HCC) 10/25/2017   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   Abscess 10/25/2017   Diabetic nephropathy associated with type 2 diabetes mellitus (HCC) 10/25/2017   Type 2 diabetes mellitus with complication (HCC) 09/13/2017   Sleep apnea 08/16/2017   Chronic pain of right knee 07/13/2016   Morbid obesity (HCC) 02/04/2015   Menorrhagia with regular cycle 11/18/2014    PCP: [name], MD  REFERRING PROVIDER: Rosalynn Camie CROME, MD   REFERRING DIAG: M17.9 (ICD-10-CM) - Osteoarthritis of knee, unspecified laterality, unspecified osteoarthritis type   THERAPY DIAG:  Chronic pain of right knee  Chronic pain of left knee  Pain in right hip  Other abnormalities of gait and mobility  Muscle weakness (generalized)  Rationale for Evaluation and Treatment: Rehabilitation  ONSET DATE: Chronic  SUBJECTIVE:   SUBJECTIVE STATEMENT: Pt presents to PT with reports of continued significant pain in bilateral knees. Has been compliant with HEP with no adverse effect.   PERTINENT HISTORY: DM II, HTN  PAIN:  Are you having pain?  Yes: NPRS scale: 5/10 Worst: 8/10 Pain location: bilateral knees,  Pain description: sharp, achy Aggravating factors: walking, stairs,  Relieving factors: medication, rest, injections  PRECAUTIONS: None  RED FLAGS: None   WEIGHT BEARING RESTRICTIONS: No  FALLS:   Has patient fallen in last 6 months? No  LIVING ENVIRONMENT: Lives with: lives with their family Lives in: House/apartment Stairs: Yes: External: 3 steps; none Has following equipment at home: None  OCCUPATION: Not currently working  PLOF: Independent  PATIENT GOALS: decrease knee pain, improve comfort with walking, lose weight  NEXT MD VISIT: 10/22/22  OBJECTIVE:   DIAGNOSTIC FINDINGS: See imaging   PATIENT SURVEYS:  FOTO: 39% function; 54% predicted  12/20/22 42%  COGNITION: Overall cognitive status: Within functional limits for tasks assessed     SENSATION: WFL  POSTURE: genu valgum and large body habitus  PALPATION: Patellar hypomobility, TTP to distal quad  LOWER EXTREMITY ROM:  Active ROM Right eval Left eval R 12/20/22 L 12/20/22  Knee flexion 92 95 94d 116d  Knee extension 7 4 -4d 0d   (Blank rows = not tested)  LOWER EXTREMITY MMT:  MMT Right eval Left eval  Hip flexion 3+/5 3+/5  Hip extension    Hip abduction 3+/5 3+/5  Hip adduction  Hip internal rotation    Hip external rotation    Knee flexion 3+/5 3+/5  Knee extension 3+/5 3+/5  Ankle dorsiflexion    Ankle plantarflexion    Ankle inversion    Ankle eversion     (Blank rows = not tested)  LOWER EXTREMITY SPECIAL TESTS:  DNT  FUNCTIONAL TESTS:  30 Second Sit to Stand: 7 reps with UE 10//3/24 13 stands UE assist from lap  GAIT: Distance walked: 61ft Assistive device utilized: None Level of assistance: Complete Independence Comments: antalgic gait, wide BoS   TREATMENT: [facility] Adult PT Treatment:                                                DATE: 02/25/23 Therapeutic Exercise: NuStep lvl 5 UE/LE x 4 min while taking subjective Standing hip abd 2x10  Standing mini squat at counter 2x10 - UE support FAQ with ball 3x10 3# Seated march 3x20 3# Supine modified leg press into green physioball 2x10 each STS 2x5 - low table with UE support McConnell taping to R knee with medial  glide  [facility] Adult PT Treatment:                                                DATE: 02/04/23 Therapeutic Exercise: NuStep lvl 5 UE/LE x 5 min while taking subjective FAQ with ball 3x10 2.5# Seated march 3x20 2.5# Supine QS x 10 - 5 hold Supine SLR 3x5 each Seated hamstring curl 2x15 black band Standing hip abd 2x10 each STS no UE 3x5 - low table Seated hamstring stretch 2x30 each Seated heel slide x 10 - 5 hold  [facility] Adult PT Treatment:                                                DATE: 01/25/23 Therapeutic Exercise: NuStep lvl 6 UE/LE x 3 min while taking subjective FAQ with ball 3x10 STS no UE 3x5 - low table Seated march 3x30 4# Standing hip abd 2x10 each McConnell taping to R knee with medial glide  [facility] Adult PT Treatment:                                                DATE: 01/14/23 Therapeutic Exercise: NuStep lvl 5 UE/LE x 3 min while taking subjective FAQ with ball 3x10 Supine SLR 2x10 - R difficult Bridge with ball 3x10 Supine clamshell 2x15 black band Supine march 2x20 black band STS no UE 3x5  [facility] Adult PT Treatment:                                                DATE: 12/20/22 Therapeutic Exercise: 10x STS FAQs 10/10 SLR 10/10 Supine hip fallouts GTB 10x B, 10/10 unilaterally S/L clams GTB 10/10 SAQs 10/10  [facility] Adult PT Treatment:  DATE: 10/31/22 Therapeutic Exercise: Nustep level 5 x 5 mins LE only LAQ 2x10 2.5#  Seated marching 2.5# 2x30 Seated clamshell GTB 2x15  PATIENT EDUCATION:  Education details: HEP update Person educated: Patient Education method: Explanation, Demonstration, and Handouts Education comprehension: verbalized understanding and returned demonstration  HOME EXERCISE PROGRAM: Access Code: [email or web address] URL: [email or web address] Date: 01/25/2023 Prepared by: Alm Kingdom  Exercises - Active Straight Leg Raise with Quad Set  - 1 x daily - 7 x weekly - 2 sets -  10 reps - Seated Long Arc Quad  - 1 x daily - 7 x weekly - 3 sets - 10 reps - Hooklying Single Leg Bent Knee Fallouts with Resistance  - 1 x daily - 7 x weekly - 3 sets - 10 reps - Supine Knee Extension Strengthening  - 1 x daily - 7 x weekly - 3 sets - 10 reps - Standing Hip Abduction with Counter Support  - 1 x daily - 7 x weekly - 3 sets - 10 reps  ASSESSMENT:  CLINICAL IMPRESSION: Pt was able to complete all prescribed exercises, continues to be limited by significant knee pain. Therapy focused exercises on improving quad and proximal hip strength for decreasing knee pain and improving functional mobility. Taping does continue to help with decreasing R knee pain. Pt continues to need skilled PT for improving strength, will continue per POC.   OBJECTIVE IMPAIRMENTS: Abnormal gait, decreased mobility, difficulty walking, decreased ROM, decreased strength, and pain.   ACTIVITY LIMITATIONS: standing, squatting, stairs, transfers, and locomotion level  PARTICIPATION LIMITATIONS: driving, shopping, community activity, and yard work  PERSONAL FACTORS: Fitness and 1-2 comorbidities: DM II, HTN are also affecting patient's functional outcome.    GOALS: Goals reviewed with patient? No  SHORT TERM GOALS: Target date: 11/06/2022   Pt will be compliant and knowledgeable with initial HEP for improved comfort and carryover Baseline: initial HEP given  Goal status: Partially met  2.  Pt will self report bilateral knee pain no greater than 6/10 for improved comfort and functional ability Baseline: 8/10 at worst; 12/20/22 6/10 Goal status: Met  LONG TERM GOALS: Target date: 02/19/2023   Pt will improve FOTO function score to no less than 54% as proxy for functional improvement Baseline: 39% function; 12/19/22 41% Goal status: INITIAL  2.  Pt will self report bilateral knee pain no greater than 3/10 for improved comfort and functional ability Baseline: 8/10 at worst Goal status: INITIAL   3.   Pt will increase 30 Second Sit to Stand rep count to no less than 9 reps for improved balance, strength, and functional mobility Baseline: 7 reps with UE; 12/20/22 13 reps Goal status: Met  4.  Pt will improve bilateral knee AROM to no less than 3-110 degrees for improved comfort and functional mobility with stairs and other community navigatoin Baseline: see ROM chart Goal status: INITIAL   PLAN:  PT FREQUENCY: 2x/week  PT DURATION: 8 weeks  PLANNED INTERVENTIONS: Therapeutic exercises, Therapeutic activity, Neuromuscular re-education, Balance training, Gait training, Patient/Family education, Self Care, Joint mobilization, Aquatic Therapy, Dry Needling, Electrical stimulation, Cryotherapy, Moist heat, Vasopneumatic device, Manual therapy, and Re-evaluation  PLAN FOR NEXT SESSION: assess HEP response, progress quad and proximal hip strength  Check all possible CPT codes: 02835 - PT Re-evaluation, 97110- Therapeutic Exercise, [phone]- Neuro Re-education, [phone] - Gait Training, 97140 - Manual Therapy, 97530 - Therapeutic Activities, 97535 - Self Care, and 97016 - Vaso    Check all conditions that are expected to impact  treatment: {Conditions expected to impact treatment:Diabetes mellitus and Musculoskeletal disorders   If treatment provided at initial evaluation, no treatment charged due to lack of authorization.       Alm JAYSON Kingdom, PT 02/26/2023, 12:57 PM

## 2023-02-27 ENCOUNTER — Ambulatory Visit: Payer: Medicaid Other | Admitting: Family Medicine

## 2023-03-04 ENCOUNTER — Ambulatory Visit: Payer: Medicaid Other | Admitting: Family Medicine

## 2023-03-04 VITALS — BP 124/75 | HR 108 | Ht 64.0 in | Wt 329.0 lb

## 2023-03-04 DIAGNOSIS — G8929 Other chronic pain: Secondary | ICD-10-CM | POA: Diagnosis not present

## 2023-03-04 DIAGNOSIS — Z0000 Encounter for general adult medical examination without abnormal findings: Secondary | ICD-10-CM

## 2023-03-04 DIAGNOSIS — E118 Type 2 diabetes mellitus with unspecified complications: Secondary | ICD-10-CM | POA: Diagnosis not present

## 2023-03-04 LAB — POCT GLYCOSYLATED HEMOGLOBIN (HGB A1C): HbA1c, POC (controlled diabetic range): 8.8 % — AB (ref 0.0–7.0)

## 2023-03-04 MED ORDER — DULOXETINE HCL 20 MG PO CPEP
20.0000 mg | ORAL_CAPSULE | Freq: Every day | ORAL | 3 refills | Status: DC
Start: 2023-03-04 — End: 2023-05-13

## 2023-03-04 MED ORDER — OZEMPIC (2 MG/DOSE) 8 MG/3ML ~~LOC~~ SOPN: 2.0000 mg | PEN_INJECTOR | SUBCUTANEOUS | 5 refills | Status: DC

## 2023-03-04 NOTE — Progress Notes (Signed)
    SUBJECTIVE:   CHIEF COMPLAINT / HPI:   DM is a 56yo F w/ hx of T2DM, OA of BL knees, chronic abm pain, GERD, h pylori infxn, ventral hernia s/p repair that p/f diabetes f/u.  H Pylori - Still waiting to see infectious disease doctor at [facility]. Apparently they are the only ones able to do their specific test that ID wants. This apt is in Apr 25 2023.  - Still having pain - Reports some blood in her stool, but that has improved.   T2DM - Has been able to take ozempic, metformin, and jardiance consistently    - Had a normal pap smear about 3 years ago at [facility].     - UACR - mammogram  OBJECTIVE:   BP 124/75   Pulse (!) 108   Ht 5\' 4"  (1.626 m)   Wt (!) 329 lb (149.2 kg)   LMP 04/05/2016   SpO2 95%   BMI 56.47 kg/m   General: Alert, pleasant, nontoxic appearing woman. NAD. HEENT: NCAT. MMM. CV: RRR, no murmurs.   Resp: CTAB, no wheezing or crackles. Normal WOB on RA.  Abm: Diffuse discomfort to palpation with palpation.  Soft, nondistended.  No rigidity, guarding, rebound.  BS present. Ext: Moves all ext spontaneously Skin: Warm, well perfused   ASSESSMENT/PLAN:   Assessment & Plan Type 2 diabetes mellitus with complication (HCC) A1c 8.8, improving but still above goal of 7.  Has had difficulty with Ozempic adherence due to pharmacy backorder, suspect patient has only been consistently taking Ozempic for about 1 month.  Patient now has access to Ozempic, and plans to reach out to pharmacy to see if they are able to get her next refill.  Will defer medication changes, until patient is consistently taking her Ozempic.\ -Refill provided for Ozempic -Continue metformin and Jardiance -Check UACR today -Repeat A1c in 3 months.  If still elevated at that point, recommend starting basal insulin (ie glargine) Healthcare maintenance Due for mammogram, ordered. Patient reports that she had normal Pap smear done at health department, will send information  request.   Lincoln Brigham, MD [facility]

## 2023-03-04 NOTE — Patient Instructions (Signed)
Good to see you today - Thank you for coming in  Things we discussed today:  1) you are due for a mammogram.  Please call the [facility] to schedule a mammogram.  [facility] [address] [place],  [place]  [address] [phone]   2) your A1c today is 8.8, which is better than before.  Congratulations!  Our goal is to continue getting it lower, and get it under 7. -Continue taking your Ozempic, metformin, and Jardiance -Try to limit your intake of high carb and sugary foods such as sodas, juices, candies -We will recheck your A1c in 3 months  3) We we will try to get your Pap smear results from the health department.

## 2023-03-05 LAB — MICROALBUMIN / CREATININE URINE RATIO
Creatinine, Urine: 58.1 mg/dL
Microalb/Creat Ratio: 22 mg/g{creat} (ref 0–29)
Microalbumin, Urine: 12.7 ug/mL

## 2023-03-06 ENCOUNTER — Ambulatory Visit: Payer: Medicaid Other

## 2023-03-07 ENCOUNTER — Ambulatory Visit: Payer: Medicaid Other | Admitting: Gastroenterology

## 2023-03-07 NOTE — Assessment & Plan Note (Signed)
A1c 8.8, improving but still above goal of 7.  Has had difficulty with Ozempic adherence due to pharmacy backorder, suspect patient has only been consistently taking Ozempic for about 1 month.  Patient now has access to Ozempic, and plans to reach out to pharmacy to see if they are able to get her next refill.  Will defer medication changes, until patient is consistently taking her Ozempic.\ -Refill provided for Ozempic -Continue metformin and Jardiance -Check UACR today -Repeat A1c in 3 months.  If still elevated at that point, recommend starting basal insulin (ie glargine)

## 2023-03-12 ENCOUNTER — Ambulatory Visit: Payer: Medicaid Other

## 2023-03-18 ENCOUNTER — Encounter: Payer: Self-pay | Admitting: Family Medicine

## 2023-03-18 ENCOUNTER — Emergency Department (HOSPITAL_COMMUNITY): Payer: Medicaid Other

## 2023-03-18 ENCOUNTER — Encounter (HOSPITAL_COMMUNITY): Payer: Self-pay

## 2023-03-18 ENCOUNTER — Inpatient Hospital Stay (HOSPITAL_COMMUNITY)
Admission: EM | Admit: 2023-03-18 | Discharge: 2023-03-24 | DRG: 202 | Disposition: A | Discharge status: Home / Self Care | Payer: Medicaid Other | Attending: Internal Medicine | Admitting: Internal Medicine

## 2023-03-18 ENCOUNTER — Other Ambulatory Visit: Payer: Self-pay

## 2023-03-18 ENCOUNTER — Ambulatory Visit: Payer: Medicaid Other | Admitting: Family Medicine

## 2023-03-18 VITALS — BP 135/82 | HR 98 | Ht 64.0 in | Wt 326.6 lb

## 2023-03-18 DIAGNOSIS — Z6841 Body Mass Index (BMI) 40.0 and over, adult: Secondary | ICD-10-CM

## 2023-03-18 DIAGNOSIS — Z9049 Acquired absence of other specified parts of digestive tract: Secondary | ICD-10-CM

## 2023-03-18 DIAGNOSIS — R0600 Dyspnea, unspecified: Secondary | ICD-10-CM | POA: Diagnosis present

## 2023-03-18 DIAGNOSIS — R0602 Shortness of breath: Secondary | ICD-10-CM

## 2023-03-18 DIAGNOSIS — R0989 Other specified symptoms and signs involving the circulatory and respiratory systems: Secondary | ICD-10-CM | POA: Diagnosis not present

## 2023-03-18 DIAGNOSIS — M170 Bilateral primary osteoarthritis of knee: Secondary | ICD-10-CM | POA: Diagnosis present

## 2023-03-18 DIAGNOSIS — R918 Other nonspecific abnormal finding of lung field: Secondary | ICD-10-CM | POA: Diagnosis not present

## 2023-03-18 DIAGNOSIS — E1121 Type 2 diabetes mellitus with diabetic nephropathy: Secondary | ICD-10-CM | POA: Diagnosis present

## 2023-03-18 DIAGNOSIS — I444 Left anterior fascicular block: Secondary | ICD-10-CM | POA: Diagnosis present

## 2023-03-18 DIAGNOSIS — G4730 Sleep apnea, unspecified: Secondary | ICD-10-CM | POA: Diagnosis present

## 2023-03-18 DIAGNOSIS — Z7984 Long term (current) use of oral hypoglycemic drugs: Secondary | ICD-10-CM

## 2023-03-18 DIAGNOSIS — Z7951 Long term (current) use of inhaled steroids: Secondary | ICD-10-CM

## 2023-03-18 DIAGNOSIS — B338 Other specified viral diseases: Principal | ICD-10-CM

## 2023-03-18 DIAGNOSIS — R059 Cough, unspecified: Secondary | ICD-10-CM | POA: Diagnosis not present

## 2023-03-18 DIAGNOSIS — K760 Fatty (change of) liver, not elsewhere classified: Secondary | ICD-10-CM | POA: Diagnosis present

## 2023-03-18 DIAGNOSIS — E1142 Type 2 diabetes mellitus with diabetic polyneuropathy: Secondary | ICD-10-CM | POA: Diagnosis present

## 2023-03-18 DIAGNOSIS — J205 Acute bronchitis due to respiratory syncytial virus: Principal | ICD-10-CM | POA: Diagnosis present

## 2023-03-18 DIAGNOSIS — Z8249 Family history of ischemic heart disease and other diseases of the circulatory system: Secondary | ICD-10-CM

## 2023-03-18 DIAGNOSIS — B974 Respiratory syncytial virus as the cause of diseases classified elsewhere: Secondary | ICD-10-CM | POA: Diagnosis not present

## 2023-03-18 DIAGNOSIS — Z87442 Personal history of urinary calculi: Secondary | ICD-10-CM

## 2023-03-18 DIAGNOSIS — E1165 Type 2 diabetes mellitus with hyperglycemia: Secondary | ICD-10-CM | POA: Diagnosis present

## 2023-03-18 DIAGNOSIS — Z1152 Encounter for screening for COVID-19: Secondary | ICD-10-CM

## 2023-03-18 DIAGNOSIS — Z9851 Tubal ligation status: Secondary | ICD-10-CM

## 2023-03-18 DIAGNOSIS — E876 Hypokalemia: Secondary | ICD-10-CM | POA: Diagnosis present

## 2023-03-18 DIAGNOSIS — Z7985 Long-term (current) use of injectable non-insulin antidiabetic drugs: Secondary | ICD-10-CM

## 2023-03-18 DIAGNOSIS — Z833 Family history of diabetes mellitus: Secondary | ICD-10-CM

## 2023-03-18 DIAGNOSIS — E118 Type 2 diabetes mellitus with unspecified complications: Secondary | ICD-10-CM | POA: Diagnosis present

## 2023-03-18 DIAGNOSIS — I10 Essential (primary) hypertension: Secondary | ICD-10-CM | POA: Diagnosis present

## 2023-03-18 DIAGNOSIS — G8929 Other chronic pain: Secondary | ICD-10-CM | POA: Diagnosis present

## 2023-03-18 DIAGNOSIS — K219 Gastro-esophageal reflux disease without esophagitis: Secondary | ICD-10-CM | POA: Diagnosis present

## 2023-03-18 LAB — CBC WITH DIFFERENTIAL/PLATELET
Abs Immature Granulocytes: 0.03 10*3/uL (ref 0.00–0.07)
Basophils Absolute: 0 10*3/uL (ref 0.0–0.1)
Basophils Relative: 1 %
Eosinophils Absolute: 0.4 10*3/uL (ref 0.0–0.5)
Eosinophils Relative: 5 %
HCT: 42.1 % (ref 36.0–46.0)
Hemoglobin: 13.4 g/dL (ref 12.0–15.0)
Immature Granulocytes: 0 %
Lymphocytes Relative: 25 %
Lymphs Abs: 1.8 10*3/uL (ref 0.7–4.0)
MCH: 24.5 pg — ABNORMAL LOW (ref 26.0–34.0)
MCHC: 31.8 g/dL (ref 30.0–36.0)
MCV: 76.8 fL — ABNORMAL LOW (ref 80.0–100.0)
Monocytes Absolute: 1 10*3/uL (ref 0.1–1.0)
Monocytes Relative: 13 %
Neutro Abs: 4.1 10*3/uL (ref 1.7–7.7)
Neutrophils Relative %: 56 %
Platelets: 285 10*3/uL (ref 150–400)
RBC: 5.48 MIL/uL — ABNORMAL HIGH (ref 3.87–5.11)
RDW: 17.4 % — ABNORMAL HIGH (ref 11.5–15.5)
WBC: 7.2 10*3/uL (ref 4.0–10.5)
nRBC: 0 % (ref 0.0–0.2)

## 2023-03-18 LAB — COMPREHENSIVE METABOLIC PANEL
ALT: 25 U/L (ref 0–44)
AST: 22 U/L (ref 15–41)
Albumin: 4.1 g/dL (ref 3.5–5.0)
Alkaline Phosphatase: 79 U/L (ref 38–126)
Anion gap: 16 — ABNORMAL HIGH (ref 5–15)
BUN: 5 mg/dL — ABNORMAL LOW (ref 6–20)
CO2: 21 mmol/L — ABNORMAL LOW (ref 22–32)
Calcium: 8.4 mg/dL — ABNORMAL LOW (ref 8.9–10.3)
Chloride: 97 mmol/L — ABNORMAL LOW (ref 98–111)
Creatinine, Ser: 0.48 mg/dL (ref 0.44–1.00)
GFR, Estimated: >60 mL/min (ref >60)
Glucose, Bld: 240 mg/dL — ABNORMAL HIGH (ref 70–99)
Potassium: 3.1 mmol/L — ABNORMAL LOW (ref 3.5–5.1)
Sodium: 134 mmol/L — ABNORMAL LOW (ref 135–145)
Total Bilirubin: 0.6 mg/dL (ref 0.0–1.2)
Total Protein: 8.2 g/dL — ABNORMAL HIGH (ref 6.5–8.1)

## 2023-03-18 LAB — URINALYSIS, W/ REFLEX TO CULTURE (INFECTION SUSPECTED)
Bilirubin Urine: NEGATIVE
Glucose, UA: NEGATIVE mg/dL
Hgb urine dipstick: NEGATIVE
Ketones, ur: NEGATIVE mg/dL
Leukocytes,Ua: NEGATIVE
Nitrite: NEGATIVE
Protein, ur: NEGATIVE mg/dL
Specific Gravity, Urine: 1.005 (ref 1.005–1.030)
pH: 6 (ref 5.0–8.0)

## 2023-03-18 LAB — RESP PANEL BY RT-PCR (RSV, FLU A&B, COVID)  RVPGX2
Influenza A by PCR: NEGATIVE
Influenza B by PCR: NEGATIVE
Resp Syncytial Virus by PCR: POSITIVE — AB
SARS Coronavirus 2 by RT PCR: NEGATIVE

## 2023-03-18 MED ORDER — METHYLPREDNISOLONE SODIUM SUCC 125 MG IJ SOLR
125.0000 mg | Freq: Once | INTRAMUSCULAR | Status: AC
  Administered 2023-03-18: 125 mg via INTRAVENOUS
  Filled 2023-03-18: qty 2

## 2023-03-18 MED ORDER — IPRATROPIUM-ALBUTEROL 0.5-2.5 (3) MG/3ML IN SOLN
3.0000 mL | Freq: Once | RESPIRATORY_TRACT | Status: AC
  Administered 2023-03-18: 3 mL via RESPIRATORY_TRACT
  Filled 2023-03-18: qty 3

## 2023-03-18 NOTE — Assessment & Plan Note (Addendum)
Given pt's SOB with focal lung finding on exam and no improvement with at home nebulizers, I am suspicious for pneumonia. Reassuringly afebrile with normal O2 saturation on RA. Other causes could be viral illness. Given risk factors and pt agreement I recommended that pt go to the ED for further evaluation including chest XR, as the office will be closed for the holiday and it is not guaranteed she could get in to get a chest XR today. Pt agreeable to go to ED for further work up

## 2023-03-18 NOTE — Patient Instructions (Addendum)
It was great to see you today! Thank you for choosing [facility] for your primary care.  Please go to the ER for workup of pneumonia as we discussed.  Please make an appointment to follow-up in our clinic after your ED visit. Please arrive 15 minutes before your appointment to ensure smooth check in process.  We appreciate your efforts in making this happen.  Thank you for allowing me to participate in your care, Para March, DO 03/18/2023, 4:27 PM PGY-1, [facility]

## 2023-03-18 NOTE — ED Provider Triage Note (Signed)
Emergency Medicine Provider Triage Evaluation Note  Rita Bass , a 57 y.o. female  was evaluated in triage.  Pt complains of sob. Endorse productive cough, wheeze and sob since yesterday.  No fever, chills, body aches.  No hemoptysis.  Have been using her inhaler more than usual.  No recent sick contact. No recent travel.  No hx of PE/DVT  Seen by PCP, and sent here for further eval  Review of Systems  Positive: As above Negative: As above  Physical Exam  BP (!) 172/104 (BP Location: Right Arm)   Pulse (!) 120   Temp 98.4 F (36.9 C) (Oral)   Resp (!) 22   LMP 04/05/2016   SpO2 99%  Gen:   Awake, no distress   Resp:  Normal effort  MSK:   Moves extremities without difficulty  Other:    Medical Decision Making  Medically screening exam initiated at 6:48 PM.  Appropriate orders placed.  Rita Bass was informed that the remainder of the evaluation will be completed by another provider, this initial triage assessment does not replace that evaluation, and the importance of remaining in the ED until their evaluation is complete.     Rita Helper, PA-C 03/18/23 [phone]

## 2023-03-18 NOTE — ED Triage Notes (Signed)
Pt referred to the ER by her PCP. Concerned the pt may have pneumonia. Pt endorses SOB, coughing, diarrhea, and wheezing going on for 2x days. PCP states rhonchi noted in right lung. Denies any fevers, nausea/vomiting.

## 2023-03-19 ENCOUNTER — Telehealth: Payer: Self-pay | Admitting: Student

## 2023-03-19 DIAGNOSIS — Z8249 Family history of ischemic heart disease and other diseases of the circulatory system: Secondary | ICD-10-CM | POA: Diagnosis not present

## 2023-03-19 DIAGNOSIS — Z1152 Encounter for screening for COVID-19: Secondary | ICD-10-CM | POA: Diagnosis not present

## 2023-03-19 DIAGNOSIS — R0600 Dyspnea, unspecified: Secondary | ICD-10-CM | POA: Diagnosis present

## 2023-03-19 DIAGNOSIS — G4730 Sleep apnea, unspecified: Secondary | ICD-10-CM | POA: Diagnosis not present

## 2023-03-19 DIAGNOSIS — R0602 Shortness of breath: Secondary | ICD-10-CM | POA: Diagnosis not present

## 2023-03-19 DIAGNOSIS — E1165 Type 2 diabetes mellitus with hyperglycemia: Secondary | ICD-10-CM | POA: Diagnosis not present

## 2023-03-19 DIAGNOSIS — G8929 Other chronic pain: Secondary | ICD-10-CM | POA: Diagnosis not present

## 2023-03-19 DIAGNOSIS — E876 Hypokalemia: Secondary | ICD-10-CM | POA: Diagnosis not present

## 2023-03-19 DIAGNOSIS — R0989 Other specified symptoms and signs involving the circulatory and respiratory systems: Secondary | ICD-10-CM | POA: Diagnosis not present

## 2023-03-19 DIAGNOSIS — J205 Acute bronchitis due to respiratory syncytial virus: Secondary | ICD-10-CM | POA: Diagnosis not present

## 2023-03-19 DIAGNOSIS — Z6841 Body Mass Index (BMI) 40.0 and over, adult: Secondary | ICD-10-CM | POA: Diagnosis not present

## 2023-03-19 DIAGNOSIS — B338 Other specified viral diseases: Secondary | ICD-10-CM

## 2023-03-19 DIAGNOSIS — I10 Essential (primary) hypertension: Secondary | ICD-10-CM | POA: Diagnosis not present

## 2023-03-19 DIAGNOSIS — E1142 Type 2 diabetes mellitus with diabetic polyneuropathy: Secondary | ICD-10-CM | POA: Diagnosis not present

## 2023-03-19 DIAGNOSIS — Z87442 Personal history of urinary calculi: Secondary | ICD-10-CM | POA: Diagnosis not present

## 2023-03-19 DIAGNOSIS — E1121 Type 2 diabetes mellitus with diabetic nephropathy: Secondary | ICD-10-CM | POA: Diagnosis not present

## 2023-03-19 DIAGNOSIS — J121 Respiratory syncytial virus pneumonia: Secondary | ICD-10-CM | POA: Diagnosis not present

## 2023-03-19 DIAGNOSIS — R0609 Other forms of dyspnea: Secondary | ICD-10-CM | POA: Diagnosis not present

## 2023-03-19 DIAGNOSIS — B974 Respiratory syncytial virus as the cause of diseases classified elsewhere: Secondary | ICD-10-CM | POA: Diagnosis not present

## 2023-03-19 DIAGNOSIS — Z7984 Long term (current) use of oral hypoglycemic drugs: Secondary | ICD-10-CM | POA: Diagnosis not present

## 2023-03-19 DIAGNOSIS — Z7985 Long-term (current) use of injectable non-insulin antidiabetic drugs: Secondary | ICD-10-CM | POA: Diagnosis not present

## 2023-03-19 DIAGNOSIS — Z833 Family history of diabetes mellitus: Secondary | ICD-10-CM | POA: Diagnosis not present

## 2023-03-19 DIAGNOSIS — K219 Gastro-esophageal reflux disease without esophagitis: Secondary | ICD-10-CM | POA: Diagnosis not present

## 2023-03-19 DIAGNOSIS — Z7951 Long term (current) use of inhaled steroids: Secondary | ICD-10-CM | POA: Diagnosis not present

## 2023-03-19 DIAGNOSIS — R079 Chest pain, unspecified: Secondary | ICD-10-CM | POA: Diagnosis not present

## 2023-03-19 DIAGNOSIS — I444 Left anterior fascicular block: Secondary | ICD-10-CM | POA: Diagnosis not present

## 2023-03-19 DIAGNOSIS — M170 Bilateral primary osteoarthritis of knee: Secondary | ICD-10-CM | POA: Diagnosis not present

## 2023-03-19 DIAGNOSIS — Z9049 Acquired absence of other specified parts of digestive tract: Secondary | ICD-10-CM | POA: Diagnosis not present

## 2023-03-19 DIAGNOSIS — R918 Other nonspecific abnormal finding of lung field: Secondary | ICD-10-CM | POA: Diagnosis not present

## 2023-03-19 DIAGNOSIS — Z9851 Tubal ligation status: Secondary | ICD-10-CM | POA: Diagnosis not present

## 2023-03-19 DIAGNOSIS — R059 Cough, unspecified: Secondary | ICD-10-CM | POA: Diagnosis not present

## 2023-03-19 DIAGNOSIS — K760 Fatty (change of) liver, not elsewhere classified: Secondary | ICD-10-CM | POA: Diagnosis not present

## 2023-03-19 LAB — BASIC METABOLIC PANEL
Anion gap: 12 (ref 5–15)
BUN: 8 mg/dL (ref 6–20)
CO2: 21 mmol/L — ABNORMAL LOW (ref 22–32)
Calcium: 9 mg/dL (ref 8.9–10.3)
Chloride: 101 mmol/L (ref 98–111)
Creatinine, Ser: 0.55 mg/dL (ref 0.44–1.00)
GFR, Estimated: >60 mL/min (ref >60)
Glucose, Bld: 266 mg/dL — ABNORMAL HIGH (ref 70–99)
Potassium: 3.6 mmol/L (ref 3.5–5.1)
Sodium: 134 mmol/L — ABNORMAL LOW (ref 135–145)

## 2023-03-19 LAB — CBC
HCT: 40.5 % (ref 36.0–46.0)
Hemoglobin: 13.3 g/dL (ref 12.0–15.0)
MCH: 25 pg — ABNORMAL LOW (ref 26.0–34.0)
MCHC: 32.8 g/dL (ref 30.0–36.0)
MCV: 76.3 fL — ABNORMAL LOW (ref 80.0–100.0)
Platelets: 254 10*3/uL (ref 150–400)
RBC: 5.31 MIL/uL — ABNORMAL HIGH (ref 3.87–5.11)
RDW: 17.3 % — ABNORMAL HIGH (ref 11.5–15.5)
WBC: 7.4 10*3/uL (ref 4.0–10.5)
nRBC: 0 % (ref 0.0–0.2)

## 2023-03-19 LAB — CBG MONITORING, ED
Glucose-Capillary: 312 mg/dL — ABNORMAL HIGH (ref 70–99)
Glucose-Capillary: 399 mg/dL — ABNORMAL HIGH (ref 70–99)
Glucose-Capillary: 406 mg/dL — ABNORMAL HIGH (ref 70–99)

## 2023-03-19 LAB — PHOSPHORUS: Phosphorus: 2.5 mg/dL (ref 2.5–4.6)

## 2023-03-19 LAB — CREATININE, SERUM
Creatinine, Ser: 0.34 mg/dL — ABNORMAL LOW (ref 0.44–1.00)
GFR, Estimated: >60 mL/min (ref >60)

## 2023-03-19 LAB — HIV ANTIBODY (ROUTINE TESTING W REFLEX): HIV Screen 4th Generation wRfx: NONREACTIVE

## 2023-03-19 LAB — MAGNESIUM: Magnesium: 2 mg/dL (ref 1.7–2.4)

## 2023-03-19 LAB — GLUCOSE, CAPILLARY
Glucose-Capillary: 280 mg/dL — ABNORMAL HIGH (ref 70–99)
Glucose-Capillary: 176 mg/dL — ABNORMAL HIGH (ref 70–99)

## 2023-03-19 LAB — TROPONIN I (HIGH SENSITIVITY): Troponin I (High Sensitivity): 5 ng/L (ref <18)

## 2023-03-19 LAB — BRAIN NATRIURETIC PEPTIDE: B Natriuretic Peptide: 21.6 pg/mL (ref 0.0–100.0)

## 2023-03-19 MED ORDER — GABAPENTIN 100 MG PO CAPS
100.0000 mg | ORAL_CAPSULE | Freq: Two times a day (BID) | ORAL | Status: DC
Start: 2023-03-19 — End: 2023-03-24
  Administered 2023-03-19 – 2023-03-24 (×12): 100 mg via ORAL
  Filled 2023-03-19 (×12): qty 1

## 2023-03-19 MED ORDER — LORATADINE 10 MG PO TABS
10.0000 mg | ORAL_TABLET | Freq: Every day | ORAL | Status: DC
  Administered 2023-03-19 – 2023-03-24 (×6): 10 mg via ORAL
  Filled 2023-03-19 (×6): qty 1

## 2023-03-19 MED ORDER — AZITHROMYCIN 500 MG IV SOLR
500.0000 mg | INTRAVENOUS | Status: DC
  Administered 2023-03-19 – 2023-03-21 (×3): 500 mg via INTRAVENOUS
  Filled 2023-03-19 (×4): qty 5

## 2023-03-19 MED ORDER — IPRATROPIUM-ALBUTEROL 0.5-2.5 (3) MG/3ML IN SOLN
3.0000 mL | Freq: Once | RESPIRATORY_TRACT | Status: AC
Start: 2023-03-19 — End: 2023-03-19
  Administered 2023-03-19: 3 mL via RESPIRATORY_TRACT
  Filled 2023-03-19: qty 3

## 2023-03-19 MED ORDER — HYDROCOD POLI-CHLORPHE POLI ER 10-8 MG/5ML PO SUER
5.0000 mL | Freq: Two times a day (BID) | ORAL | Status: DC | PRN
  Administered 2023-03-19 – 2023-03-21 (×4): 5 mL via ORAL
  Filled 2023-03-19 (×6): qty 5

## 2023-03-19 MED ORDER — INSULIN ASPART 100 UNIT/ML IJ SOLN
0.0000 [IU] | Freq: Three times a day (TID) | INTRAMUSCULAR | Status: DC
Start: 2023-03-19 — End: 2023-03-24
  Administered 2023-03-19: 8 [IU] via SUBCUTANEOUS
  Administered 2023-03-20: 3 [IU] via SUBCUTANEOUS
  Administered 2023-03-20: 11 [IU] via SUBCUTANEOUS
  Administered 2023-03-20: 15 [IU] via SUBCUTANEOUS
  Administered 2023-03-21: 5 [IU] via SUBCUTANEOUS
  Administered 2023-03-21: 8 [IU] via SUBCUTANEOUS
  Administered 2023-03-21: 2 [IU] via SUBCUTANEOUS
  Administered 2023-03-22 – 2023-03-23 (×3): 5 [IU] via SUBCUTANEOUS
  Administered 2023-03-23: 3 [IU] via SUBCUTANEOUS
  Administered 2023-03-24: 5 [IU] via SUBCUTANEOUS
  Administered 2023-03-24: 8 [IU] via SUBCUTANEOUS
  Filled 2023-03-19: qty 0.15

## 2023-03-19 MED ORDER — ACETAMINOPHEN 500 MG PO TABS
1000.0000 mg | ORAL_TABLET | Freq: Once | ORAL | Status: AC
  Administered 2023-03-19: 1000 mg via ORAL
  Filled 2023-03-19: qty 2

## 2023-03-19 MED ORDER — IPRATROPIUM-ALBUTEROL 0.5-2.5 (3) MG/3ML IN SOLN
3.0000 mL | Freq: Three times a day (TID) | RESPIRATORY_TRACT | Status: DC
  Administered 2023-03-20 – 2023-03-21 (×4): 3 mL via RESPIRATORY_TRACT
  Filled 2023-03-19 (×4): qty 3

## 2023-03-19 MED ORDER — MELATONIN 5 MG PO TABS
5.0000 mg | ORAL_TABLET | Freq: Every evening | ORAL | Status: DC | PRN
  Administered 2023-03-20 – 2023-03-23 (×4): 5 mg via ORAL
  Filled 2023-03-19 (×4): qty 1

## 2023-03-19 MED ORDER — ALBUTEROL SULFATE (2.5 MG/3ML) 0.083% IN NEBU
2.5000 mg | INHALATION_SOLUTION | RESPIRATORY_TRACT | Status: DC | PRN
  Administered 2023-03-21: 2.5 mg via RESPIRATORY_TRACT
  Filled 2023-03-19: qty 3

## 2023-03-19 MED ORDER — SODIUM CHLORIDE 3 % IN NEBU
4.0000 mL | INHALATION_SOLUTION | Freq: Two times a day (BID) | RESPIRATORY_TRACT | Status: AC
  Administered 2023-03-19 – 2023-03-21 (×5): 4 mL via RESPIRATORY_TRACT
  Filled 2023-03-19 (×6): qty 4

## 2023-03-19 MED ORDER — MOMETASONE FURO-FORMOTEROL FUM 100-5 MCG/ACT IN AERO
2.0000 | INHALATION_SPRAY | Freq: Every day | RESPIRATORY_TRACT | Status: DC
  Filled 2023-03-19: qty 8.8

## 2023-03-19 MED ORDER — METHYLPREDNISOLONE SODIUM SUCC 40 MG IJ SOLR
40.0000 mg | Freq: Every day | INTRAMUSCULAR | Status: DC
  Administered 2023-03-19 – 2023-03-21 (×3): 40 mg via INTRAVENOUS
  Filled 2023-03-19 (×3): qty 1

## 2023-03-19 MED ORDER — GUAIFENESIN ER 600 MG PO TB12
600.0000 mg | ORAL_TABLET | Freq: Two times a day (BID) | ORAL | Status: DC
Start: 2023-03-19 — End: 2023-03-19
  Administered 2023-03-19: 600 mg via ORAL
  Filled 2023-03-19: qty 1

## 2023-03-19 MED ORDER — IPRATROPIUM-ALBUTEROL 0.5-2.5 (3) MG/3ML IN SOLN
3.0000 mL | RESPIRATORY_TRACT | Status: DC
  Administered 2023-03-19 (×4): 3 mL via RESPIRATORY_TRACT
  Filled 2023-03-19 (×4): qty 3

## 2023-03-19 MED ORDER — AMLODIPINE BESYLATE 5 MG PO TABS
2.5000 mg | ORAL_TABLET | Freq: Every day | ORAL | Status: DC
  Administered 2023-03-19 – 2023-03-24 (×6): 2.5 mg via ORAL
  Filled 2023-03-19 (×6): qty 1

## 2023-03-19 MED ORDER — POLYETHYLENE GLYCOL 3350 17 G PO PACK: 17.0000 g | PACK | Freq: Every day | ORAL | Status: DC | PRN

## 2023-03-19 MED ORDER — DULOXETINE HCL 20 MG PO CPEP
20.0000 mg | ORAL_CAPSULE | Freq: Every day | ORAL | Status: DC
  Administered 2023-03-19 – 2023-03-24 (×6): 20 mg via ORAL
  Filled 2023-03-19 (×6): qty 1

## 2023-03-19 MED ORDER — ACETAMINOPHEN 325 MG PO TABS
650.0000 mg | ORAL_TABLET | Freq: Four times a day (QID) | ORAL | Status: DC | PRN
  Administered 2023-03-19 – 2023-03-24 (×7): 650 mg via ORAL
  Filled 2023-03-19 (×6): qty 2

## 2023-03-19 MED ORDER — INSULIN ASPART 100 UNIT/ML IJ SOLN
0.0000 [IU] | Freq: Three times a day (TID) | INTRAMUSCULAR | Status: DC
  Administered 2023-03-19: 7 [IU] via SUBCUTANEOUS
  Administered 2023-03-19: 9 [IU] via SUBCUTANEOUS
  Filled 2023-03-19: qty 0.09

## 2023-03-19 MED ORDER — PROCHLORPERAZINE EDISYLATE 10 MG/2ML IJ SOLN: 5.0000 mg | Freq: Four times a day (QID) | INTRAMUSCULAR | Status: DC | PRN

## 2023-03-19 MED ORDER — ENOXAPARIN SODIUM 80 MG/0.8ML IJ SOSY
70.0000 mg | PREFILLED_SYRINGE | INTRAMUSCULAR | Status: DC
  Administered 2023-03-19 – 2023-03-24 (×6): 70 mg via SUBCUTANEOUS
  Filled 2023-03-19 (×3): qty 0.8
  Filled 2023-03-19: qty 0.7
  Filled 2023-03-19 (×2): qty 0.8

## 2023-03-19 MED ORDER — TRAMADOL HCL 50 MG PO TABS
50.0000 mg | ORAL_TABLET | Freq: Two times a day (BID) | ORAL | Status: DC | PRN
  Administered 2023-03-19 – 2023-03-23 (×6): 50 mg via ORAL
  Filled 2023-03-19 (×6): qty 1

## 2023-03-19 MED ORDER — GUAIFENESIN ER 600 MG PO TB12
1200.0000 mg | ORAL_TABLET | Freq: Two times a day (BID) | ORAL | Status: DC
  Administered 2023-03-19 – 2023-03-24 (×10): 1200 mg via ORAL
  Filled 2023-03-19 (×10): qty 2

## 2023-03-19 MED ORDER — INSULIN ASPART 100 UNIT/ML IJ SOLN
4.0000 [IU] | Freq: Three times a day (TID) | INTRAMUSCULAR | Status: DC
  Administered 2023-03-19 – 2023-03-24 (×16): 4 [IU] via SUBCUTANEOUS
  Filled 2023-03-19: qty 0.04

## 2023-03-19 MED ORDER — INSULIN ASPART 100 UNIT/ML IJ SOLN
0.0000 [IU] | Freq: Every day | INTRAMUSCULAR | Status: DC
  Administered 2023-03-20 – 2023-03-23 (×3): 2 [IU] via SUBCUTANEOUS
  Filled 2023-03-19: qty 0.05

## 2023-03-19 MED ORDER — INSULIN GLARGINE-YFGN 100 UNIT/ML ~~LOC~~ SOLN
15.0000 [IU] | Freq: Every day | SUBCUTANEOUS | Status: DC
  Administered 2023-03-19 – 2023-03-20 (×2): 15 [IU] via SUBCUTANEOUS
  Filled 2023-03-19 (×2): qty 0.15

## 2023-03-19 MED ORDER — LABETALOL HCL 5 MG/ML IV SOLN: 5.0000 mg | INTRAVENOUS | Status: DC | PRN

## 2023-03-19 MED ORDER — POTASSIUM CHLORIDE CRYS ER 20 MEQ PO TBCR
20.0000 meq | EXTENDED_RELEASE_TABLET | Freq: Three times a day (TID) | ORAL | Status: AC
  Administered 2023-03-19 (×3): 20 meq via ORAL
  Filled 2023-03-19 (×3): qty 1

## 2023-03-19 MED ORDER — ENSURE ENLIVE PO LIQD
237.0000 mL | Freq: Two times a day (BID) | ORAL | Status: DC
Start: 2023-03-19 — End: 2023-03-24
  Administered 2023-03-21 – 2023-03-24 (×3): 237 mL via ORAL

## 2023-03-19 MED ORDER — AMLODIPINE BESYLATE 5 MG PO TABS: 5.0000 mg | ORAL_TABLET | Freq: Every day | ORAL | Status: DC

## 2023-03-19 NOTE — ED Notes (Signed)
 Ambulatory to bathroom. Gait steady. No distress.

## 2023-03-19 NOTE — Telephone Encounter (Signed)
Patient calls after hours line to inform her PCP that she has been admitted to [facility] hospital for RSV and management of her sugars. Provider acknowledged this and told patient I would let her provider know.

## 2023-03-19 NOTE — ED Notes (Signed)
 Pharmacy called. Dulera not in pyxis or patient specific bin. Unable to administer

## 2023-03-19 NOTE — H&P (Signed)
 History and Physical  Rita Bass FMW:[id number] DOB: 1966-02-23 DOA: 03/18/2023  Referring physician: Vicky BROTHERS  PCP: [name], MD  Outpatient Specialists: GI, infectious disease. Patient coming from: Home.  Chief Complaint: Shortness of breath.  HPI: Rita Bass is a 57 y.o. female with medical history significant for severe morbid obesity, type 2 diabetes, diabetic polyneuropathy, osteoarthritis of knees bilaterally, chronic abdominal pain, GERD, H. pylori infection, ventral hernia s/p repair who presents to the ED with complaints of shortness of breath and wheezing.  She initially presented to her primary care provider and was referred to the ED for further evaluation due to abnormal lung sounds on exam and no improvement with at home nebulizers with suspicion for pneumonia.  In the ED, tachypneic, tachycardic, and wheezing, RSV by PCR positive.  Chest x-ray nonacute.  The patient received multiple rounds of breathing treatments and IV Solu-Medrol  with persistent wheezing and exertional dyspnea.  [facility], hospitalist service, was asked to admit due to persistent symptomatology.  ED Course: Temperature 98.8.  BP 143/73, pulse 102, respiratory rate 22, O2 saturation 94% on room air.  Lab studies notable for WBC 7.4, hemoglobin 13.3, platelet count 254.  Serum sodium 134, potassium 3.1, serum bicarb 21, glucose 240, anion gap 16, BNP 21, troponin 5.  Review of Systems: Review of systems as noted in the HPI. All other systems reviewed and are negative.   Past Medical History:  Diagnosis Date   Arthritis    Asthma    Chronic pain of left knee    Complication of anesthesia    PONV   Diabetes (HCC)    GERD (gastroesophageal reflux disease)    H/O bronchitis    Hepatic steatosis    Internal hemorrhoids    Kidney stones    Obesity    Renal disorder    Shortness of breath dyspnea    Sleep apnea    past issues - at weight over 400lbs   Sleep apnea in adult     Polysomnogram pending.  Followed by Dr. Shellia   Past Surgical History:  Procedure Laterality Date   BIOPSY  11/01/2020   Procedure: BIOPSY;  Surgeon: San Sandor GAILS, DO;  Location: [facility];  Service: Gastroenterology;;   BIOPSY  05/11/2021   Procedure: BIOPSY;  Surgeon: San Sandor GAILS, DO;  Location: [facility];  Service: Gastroenterology;;   BIOPSY  11/29/2022   Procedure: BIOPSY;  Surgeon: San Sandor GAILS, DO;  Location: [facility];  Service: Gastroenterology;;   CESAREAN SECTION     6 c-sections   CHOLECYSTECTOMY     COLONOSCOPY WITH PROPOFOL  N/A 05/11/2021   Procedure: COLONOSCOPY WITH PROPOFOL ;  Surgeon: San Sandor GAILS, DO;  Location: [facility];  Service: Gastroenterology;  Laterality: N/A;   ESOPHAGOGASTRODUODENOSCOPY (EGD) WITH PROPOFOL  N/A 11/01/2020   Procedure: ESOPHAGOGASTRODUODENOSCOPY (EGD) WITH PROPOFOL ;  Surgeon: San Sandor GAILS, DO;  Location: [facility];  Service: Gastroenterology;  Laterality: N/A;   ESOPHAGOGASTRODUODENOSCOPY (EGD) WITH PROPOFOL  N/A 11/29/2022   Procedure: ESOPHAGOGASTRODUODENOSCOPY (EGD) WITH PROPOFOL ;  Surgeon: San Sandor GAILS, DO;  Location: [facility];  Service: Gastroenterology;  Laterality: N/A;   HERNIA REPAIR     KIDNEY STONE SURGERY     OOPHORECTOMY     TRANSTHORACIC ECHOCARDIOGRAM  09/2017   Technically difficult study.  Did not use Definity  contrast.  EF was 55 to 60% with moderate LVH and grade 1 diastolic dysfunction.  No significant valvular lesions noted.  No regional wall motion normality but difficult to assess due to poor  imaging.    TUBAL LIGATION     UPPER GASTROINTESTINAL ENDOSCOPY      Social History:  reports that she has never smoked. She has never used smokeless tobacco. She reports that she does not drink alcohol and does not use drugs.   No Known Allergies  Family History  Problem Relation Age of Onset   Breast cancer Mother    Other Father        committed suicide   Diabetes Father     Heart attack Maternal Aunt    Colon cancer Neg Hx    Esophageal cancer Neg Hx    Pancreatic cancer Neg Hx    Stomach cancer Neg Hx    Liver disease Neg Hx    Colon polyps Neg Hx    Rectal cancer Neg Hx       Prior to Admission medications   Medication Sig Start Date End Date Taking? Authorizing Provider  Accu-Chek Softclix Lancets lancets Use as instructed 01/16/23   McDiarmid, Krystal BIRCH, MD  acetaminophen  (TYLENOL ) 650 MG CR tablet Take 650-1,300 mg by mouth every 8 (eight) hours as needed for pain. Last dose (2) around 0930 am.    [provider]  albuterol  (PROVENTIL ) (5 MG/ML) 0.5% nebulizer solution Take 0.5 mLs (2.5 mg total) by nebulization every 6 (six) hours as needed for wheezing or shortness of breath. 02/25/23   [name], MD  amoxicillin  (AMOXIL ) 500 MG capsule Take 2 capsules (1,000 mg total) by mouth 2 (two) times daily. 12/27/22   Waddell Alan PARAS, RPH-CPP  BINAXNOW COVID-19 AG HOME TEST KIT TEST AS DIRECTED TODAY Patient not taking: Reported on 11/20/2022 05/14/22   [provider]  cetirizine  (ZYRTEC ) 10 MG tablet Take 1 tablet (10 mg total) by mouth daily. Patient not taking: Reported on 11/20/2022 07/28/21   Espinoza, Alejandra, DO  DULoxetine  (CYMBALTA ) 20 MG capsule Take 1 capsule (20 mg total) by mouth daily. 03/04/23   [name], MD  empagliflozin  (JARDIANCE ) 25 MG TABS tablet Take 1 tablet (25 mg total) by mouth daily. 06/25/22   [name], MD  fluticasone  (FLONASE ) 50 MCG/ACT nasal spray Place 2 sprays into both nostrils daily. Patient not taking: Reported on 11/20/2022 07/25/21   Dayna Motto, DO  gabapentin  (NEURONTIN ) 100 MG capsule TAKE 4 CAPSULES(400 MG) BY MOUTH THREE TIMES DAILY 12/27/22   [name], MD  glucose blood (ACCU-CHEK GUIDE) test strip 1 each by Other route daily. for testing 01/16/23   McDiarmid, Krystal BIRCH, MD  ibuprofen  (ADVIL ) 800 MG tablet Take 1 tablet (800 mg total) by mouth 3 (three) times daily. For headache. Patient not  taking: Reported on 11/20/2022 06/24/22   Lynwood Lenis, PA-C  Insulin  Pen Needle (PEN NEEDLES) 32G X 5 MM MISC Use as directed with victoza  11/25/20   Scarlet Elsie LABOR, MD  lisinopril  (ZESTRIL ) 10 MG tablet Take 1 tablet (10 mg total) by mouth daily. 01/16/23   McDiarmid, Krystal BIRCH, MD  meloxicam  (MOBIC ) 15 MG tablet Take one pill a day with food daily as needed. 02/05/23   Arvell Evalene SAUNDERS, DO  metFORMIN  (GLUCOPHAGE -XR) 500 MG 24 hr tablet Take 2 tablets (1,000 mg total) by mouth 2 (two) times daily. 09/27/22   [name], MD  methocarbamol  (ROBAXIN ) 500 MG tablet Take 1 tablet (500 mg total) by mouth every 8 (eight) hours as needed. 01/07/23   Hudnall, Ludie SAUNDERS, MD  mometasone -formoterol  (DULERA ) 100-5 MCG/ACT AERO Inhale 2 puffs into the lungs daily. Patient not taking: Reported  on 11/20/2022 10/22/22   [name], MD  nystatin  (MYCOSTATIN /NYSTOP ) powder Apply 1 Application topically 2 (two) times daily. 01/25/23   [name], MD  olopatadine  (PATANOL) 0.1 % ophthalmic solution Place 1 drop into both eyes 2 (two) times daily. Patient not taking: Reported on 11/20/2022 07/28/21   Espinoza, Alejandra, DO  ondansetron  (ZOFRAN ) 4 MG tablet Take 1 tablet (4 mg total) by mouth every 8 (eight) hours as needed for nausea or vomiting. Patient not taking: Reported on 11/20/2022 06/16/21   Dayna Motto, DO  pantoprazole  (PROTONIX ) 40 MG tablet Take 1 tablet (40 mg total) by mouth daily. 02/26/23   Cirigliano, Vito V, DO  rifabutin  (MYCOBUTIN ) 150 MG capsule Take 1 capsule (150 mg total) by mouth 2 (two) times daily. Take with a high-fat meal. 12/28/22   Waddell Alan PARAS, RPH-CPP  Semaglutide , 2 MG/DOSE, (OZEMPIC , 2 MG/DOSE,) 8 MG/3ML SOPN Inject 2 mg into the skin once a week. 03/04/23   [name], MD  traMADol  (ULTRAM ) 50 MG tablet Take 1 tablet (50 mg total) by mouth every 12 (twelve) hours as needed. Not to be refilled before 30 days 02/25/23   [name], MD    Physical Exam: BP 139/85   Pulse 100   Temp 99  F (37.2 C) (Oral)   Resp 16   LMP 04/05/2016   SpO2 100%   General: 57 y.o. year-old female well developed well nourished in no acute distress.  Alert and oriented x3. Cardiovascular: Regular rate and rhythm with no rubs or gallops.  No thyromegaly or JVD noted.  No lower extremity edema. 2/4 pulses in all 4 extremities. Respiratory: Diffuse wheezing with poor inspiratory effort. Abdomen: Soft nontender nondistended with normal bowel sounds x4 quadrants. Muskuloskeletal: No cyanosis, clubbing or edema noted bilaterally Neuro: CN II-XII intact, strength, sensation, reflexes Skin: No ulcerative lesions noted or rashes Psychiatry: Judgement and insight appear normal. Mood is appropriate for condition and setting          Labs on Admission:  Basic Metabolic Panel: Recent Labs  Lab 03/18/23 2230  NA 134*  K 3.1*  CL 97*  CO2 21*  GLUCOSE 240*  BUN 5*  CREATININE 0.48  CALCIUM  8.4*   Liver Function Tests: Recent Labs  Lab 03/18/23 2230  AST 22  ALT 25  ALKPHOS 79  BILITOT 0.6  PROT 8.2*  ALBUMIN 4.1   No results for input(s): LIPASE, AMYLASE in the last 168 hours. No results for input(s): AMMONIA in the last 168 hours. CBC: Recent Labs  Lab 03/18/23 2230  WBC 7.2  NEUTROABS 4.1  HGB 13.4  HCT 42.1  MCV 76.8*  PLT 285   Cardiac Enzymes: No results for input(s): CKTOTAL, CKMB, CKMBINDEX, TROPONINI in the last 168 hours.  BNP (last 3 results) Recent Labs    03/18/23 2230  BNP 21.6    ProBNP (last 3 results) No results for input(s): PROBNP in the last 8760 hours.  CBG: No results for input(s): GLUCAP in the last 168 hours.  Radiological Exams on Admission: DG Chest 2 View Result Date: 03/18/2023 CLINICAL DATA:  Cough with shortness of breath and diarrhea. EXAM: CHEST - 2 VIEW COMPARISON:  January 26, 2022 FINDINGS: The heart size and mediastinal contours are within normal limits. Low lung volumes are noted. Mild, chronic appearing  increased interstitial lung markings are also seen. No pleural effusion or pneumothorax is identified. Multilevel degenerative changes are present throughout the thoracic spine. IMPRESSION: Low lung volumes with mild, chronic appearing increased  interstitial lung markings. Electronically Signed   By: Suzen Dials M.D.   On: 03/18/2023 21:22    EKG: I independently viewed the EKG done and my findings are as followed: Sinus tachycardia rate of 101.  Nonspecific ST-T changes.  QTc 467.  Assessment/Plan Present on Admission:  Dyspnea  Principal Problem:   Dyspnea  Dyspnea likely secondary to RSV infection Continue supportive care IV Solu-Medrol  for wheezing IV azithromycin  for its anti-inflammatory properties. Bronchodilators DuoNebs every 4 hours Early mobilization Home O2 evaluation prior to discharge.  RSV infection Management as stated above Add pulmonary toilet for productive cough Mucinex  plus hypersaline nebs plus flutter valve  Type 2 diabetes with hyperglycemia Likely exacerbated by IV steroids Last hemoglobin A1c 6.2 on 08/16/2021, 9.1 on 08/16/2017. Start insulin  sliding scale  Diabetic polyneuropathy Continue duloxetine  and gabapentin   Severe morbid obesity BMI 56 Recommend weight loss outpatient with regular physical activity and healthy diet  Hypokalemia Serum potassium 3.1 Repleted orally KCl 20 mill equivalent 3 times daily x 3 doses. Repeat BMP in the morning and obtain magnesium  level.  Generalized weakness PT OT assessment For precautions.    Time: 75 minutes.    DVT prophylaxis: Subcu Lovenox  daily.  Code Status: Full code.  Family Communication: None at bedside.  Disposition Plan: Admitted to telemetry unit.  Consults called: None.  Admission status: Observation status.   Status is: Observation    Terry LOISE Hurst MD [facility] Pager [phone]  If 7PM-7AM, please contact night-coverage [email or web address] Password  [email or web address]  03/19/2023, 1:21 AM

## 2023-03-19 NOTE — ED Notes (Signed)
CBG was taken at 12:00 it was 408

## 2023-03-19 NOTE — Plan of Care (Signed)
  Problem: Education: Goal: Knowledge of General Education information will improve Description: Including pain rating scale, medication(s)/side effects and non-pharmacologic comfort measures Outcome: Progressing   Problem: Activity: Goal: Risk for activity intolerance will decrease Outcome: Progressing   Problem: Nutrition: Goal: Adequate nutrition will be maintained Outcome: Progressing   Problem: Coping: Goal: Level of anxiety will decrease Outcome: Progressing   Problem: Elimination: Goal: Will not experience complications related to urinary retention Outcome: Progressing   Problem: Pain Management: Goal: General experience of comfort will improve Outcome: Progressing   Problem: Safety: Goal: Ability to remain free from injury will improve Outcome: Progressing   Problem: Skin Integrity: Goal: Risk for impaired skin integrity will decrease Outcome: Progressing

## 2023-03-19 NOTE — Progress Notes (Signed)
 PROGRESS NOTE    Rita Bass  FMW:[id number] DOB: [birth year] DOA: 03/18/2023 PCP: [name], MD   Brief Narrative: 34 with past medical history significant for severe morbid obesity, diabetes type 2, diabetic neuropathy, osteoarthritis of the knee bilaterally, chronic abdominal pain, GERD, history of H. pylori infection, ventral hernia s/p repair presents complaining of shortness of breath and wheezing.  Patient was found to be tachypneic, tachycardic, bilateral wheezing, RSV PCR positive.  Chest x-ray negative.   Assessment & Plan:   Principal Problem:   Dyspnea  1-Acute respiratory distress secondary to RSV infection -Patient presented with dyspnea, tachycardia, tachypnea RSV positive.  Chest x-ray: Low lung volumes with mild, chronic appearing increased interstitial lung markings -Continue with IV ceftriaxone to cover for superimposed PNA.  -Continue with Duoneb  -resume Dulera .  -Continue with IV solumedrol.  Support care.  Will order flutter valve.   Diabetes type 2 with hyperglycemia, Uncontrolled.  -Start Semglee . Continue SSI  Diabetic polyneuropathy: -Continue with duloxetine  and gabapentin   Severe morbid obesity: -Need lifestyle modification  Hypokalemia: -Replete orally.   Generalized weakness: -Will need PT  Hyponatremia: -Monitor.   HTN;  -Will start low dose Norvasc .      Estimated body mass index is 55.96 kg/m as calculated from the following:   Height as of this encounter: 5' 4 (1.626 m).   Weight as of this encounter: 147.9 kg.   DVT prophylaxis: Lovenox  Code Status: Full code Family Communication: daughter at bedside.  Disposition Plan:  Status is: Observation The patient remains OBS appropriate and will d/c before 2 midnights.    Consultants:  None  Procedures:  none Antimicrobials:    Subjective: She is still SOB, cough persistent  Objective: Vitals:   03/19/23 0258 03/19/23 0421 03/19/23 0545 03/19/23 0726   BP: (!) 143/73  (!) 151/119   Pulse: 97  (!) 113   Resp: (!) 22  (!) 22   Temp: 98.8 F (37.1 C)  98.8 F (37.1 C)   TempSrc: Oral     SpO2: 94% 98% 97%   Weight:    (!) 147.9 kg  Height:    5' 4 (1.626 m)   No intake or output data in the 24 hours ending 03/19/23 0815 Filed Weights   03/19/23 0726  Weight: (!) 147.9 kg    Examination:  General exam: Appears calm and comfortable  Respiratory system: BL ronchus. Respiratory effort normal. Cardiovascular system: S1 & S2 heard, RRR. No JVD, murmurs, rubs, gallops or clicks. No pedal edema. Gastrointestinal system: Abdomen is nondistended, soft and nontender. No organomegaly or masses felt. Normal bowel sounds heard. Central nervous system: Alert and oriented. No focal neurological deficits. Extremities: Symmetric 5 x 5 power.   Data Reviewed: I have personally reviewed following labs and imaging studies  CBC: Recent Labs  Lab 03/18/23 2230 03/19/23 0201  WBC 7.2 7.4  NEUTROABS 4.1  --   HGB 13.4 13.3  HCT 42.1 40.5  MCV 76.8* 76.3*  PLT 285 254   Basic Metabolic Panel: Recent Labs  Lab 03/18/23 2230 03/19/23 0201  NA 134*  --   K 3.1*  --   CL 97*  --   CO2 21*  --   GLUCOSE 240*  --   BUN 5*  --   CREATININE 0.48 0.34*  CALCIUM  8.4*  --    GFR: Estimated Creatinine Clearance: 115.4 mL/min (A) (by C-G formula based on SCr of 0.34 mg/dL (L)). Liver Function Tests: Recent Labs  Lab 03/18/23 2230  AST 22  ALT 25  ALKPHOS 79  BILITOT 0.6  PROT 8.2*  ALBUMIN 4.1   No results for input(s): LIPASE, AMYLASE in the last 168 hours. No results for input(s): AMMONIA in the last 168 hours. Coagulation Profile: No results for input(s): INR, PROTIME in the last 168 hours. Cardiac Enzymes: No results for input(s): CKTOTAL, CKMB, CKMBINDEX, TROPONINI in the last 168 hours. BNP (last 3 results) No results for input(s): PROBNP in the last 8760 hours. HbA1C: No results for input(s):  HGBA1C in the last 72 hours. CBG: Recent Labs  Lab 03/19/23 0812  GLUCAP 312*   Lipid Profile: No results for input(s): CHOL, HDL, LDLCALC, TRIG, CHOLHDL, LDLDIRECT in the last 72 hours. Thyroid Function Tests: No results for input(s): TSH, T4TOTAL, FREET4, T3FREE, THYROIDAB in the last 72 hours. Anemia Panel: No results for input(s): VITAMINB12, FOLATE, FERRITIN, TIBC, IRON, RETICCTPCT in the last 72 hours. Sepsis Labs: No results for input(s): PROCALCITON, LATICACIDVEN in the last 168 hours.  Recent Results (from the past 240 hours)  Resp panel by RT-PCR (RSV, Flu A&B, Covid) Anterior Nasal Swab     Status: Abnormal   Collection Time: 03/18/23 10:29 PM   Specimen: Anterior Nasal Swab  Result Value Ref Range Status   SARS Coronavirus 2 by RT PCR NEGATIVE NEGATIVE Final    Comment: (NOTE) SARS-CoV-2 target nucleic acids are NOT DETECTED.  The SARS-CoV-2 RNA is generally detectable in upper respiratory specimens during the acute phase of infection. The lowest concentration of SARS-CoV-2 viral copies this assay can detect is 138 copies/mL. A negative result does not preclude SARS-Cov-2 infection and should not be used as the sole basis for treatment or other patient management decisions. A negative result may occur with  improper specimen collection/handling, submission of specimen other than nasopharyngeal swab, presence of viral mutation(s) within the areas targeted by this assay, and inadequate number of viral copies(<138 copies/mL). A negative result must be combined with clinical observations, patient history, and epidemiological information. The expected result is Negative.  Fact Sheet for Patients:  bloggercourse.com  Fact Sheet for Healthcare Providers:  seriousbroker.it  This test is no t yet approved or cleared by the [place]  FDA and  has been authorized for detection  and/or diagnosis of SARS-CoV-2 by FDA under an Emergency Use Authorization (EUA). This EUA will remain  in effect (meaning this test can be used) for the duration of the COVID-19 declaration under Section 564(b)(1) of the Act, 21 U.S.C.section 360bbb-3(b)(1), unless the authorization is terminated  or revoked sooner.       Influenza A by PCR NEGATIVE NEGATIVE Final   Influenza B by PCR NEGATIVE NEGATIVE Final    Comment: (NOTE) The Xpert Xpress SARS-CoV-2/FLU/RSV plus assay is intended as an aid in the diagnosis of influenza from Nasopharyngeal swab specimens and should not be used as a sole basis for treatment. Nasal washings and aspirates are unacceptable for Xpert Xpress SARS-CoV-2/FLU/RSV testing.  Fact Sheet for Patients: bloggercourse.com  Fact Sheet for Healthcare Providers: seriousbroker.it  This test is not yet approved or cleared by the [place]  FDA and has been authorized for detection and/or diagnosis of SARS-CoV-2 by FDA under an Emergency Use Authorization (EUA). This EUA will remain in effect (meaning this test can be used) for the duration of the COVID-19 declaration under Section 564(b)(1) of the Act, 21 U.S.C. section 360bbb-3(b)(1), unless the authorization is terminated or revoked.     Resp Syncytial Virus by PCR POSITIVE (A) NEGATIVE Final  Comment: (NOTE) Fact Sheet for Patients: bloggercourse.com  Fact Sheet for Healthcare Providers: seriousbroker.it  This test is not yet approved or cleared by the [place]  FDA and has been authorized for detection and/or diagnosis of SARS-CoV-2 by FDA under an Emergency Use Authorization (EUA). This EUA will remain in effect (meaning this test can be used) for the duration of the COVID-19 declaration under Section 564(b)(1) of the Act, 21 U.S.C. section 360bbb-3(b)(1), unless the authorization is  terminated or revoked.  Performed at [facility], [address], [place], [place] [address]          Radiology Studies: DG Chest 2 View Result Date: 03/18/2023 CLINICAL DATA:  Cough with shortness of breath and diarrhea. EXAM: CHEST - 2 VIEW COMPARISON:  January 26, 2022 FINDINGS: The heart size and mediastinal contours are within normal limits. Low lung volumes are noted. Mild, chronic appearing increased interstitial lung markings are also seen. No pleural effusion or pneumothorax is identified. Multilevel degenerative changes are present throughout the thoracic spine. IMPRESSION: Low lung volumes with mild, chronic appearing increased interstitial lung markings. Electronically Signed   By: Suzen Dials M.D.   On: 03/18/2023 21:22        Scheduled Meds:  DULoxetine   20 mg Oral Daily   enoxaparin  (LOVENOX ) injection  70 mg Subcutaneous Q24H   gabapentin   100 mg Oral BID   guaiFENesin   600 mg Oral BID   insulin  aspart  0-5 Units Subcutaneous QHS   insulin  aspart  0-9 Units Subcutaneous TID WC   ipratropium-albuterol   3 mL Nebulization Q4H   methylPREDNISolone  (SOLU-MEDROL ) injection  40 mg Intravenous Daily   potassium chloride   20 mEq Oral TID   sodium chloride  HYPERTONIC  4 mL Nebulization BID   Continuous Infusions:  azithromycin        LOS: 0 days    Time spent: 35 minutes   Cherita Hebel A Nicolle Heward, MD [facility]   If 7PM-7AM, please contact night-coverage [email or web address]  03/19/2023, 8:15 AM

## 2023-03-20 DIAGNOSIS — B338 Other specified viral diseases: Secondary | ICD-10-CM | POA: Diagnosis not present

## 2023-03-20 LAB — CBC
HCT: 40.8 % (ref 36.0–46.0)
Hemoglobin: 12.5 g/dL (ref 12.0–15.0)
MCH: 23.8 pg — ABNORMAL LOW (ref 26.0–34.0)
MCHC: 30.6 g/dL (ref 30.0–36.0)
MCV: 77.6 fL — ABNORMAL LOW (ref 80.0–100.0)
Platelets: 283 10*3/uL (ref 150–400)
RBC: 5.26 MIL/uL — ABNORMAL HIGH (ref 3.87–5.11)
RDW: 17.6 % — ABNORMAL HIGH (ref 11.5–15.5)
WBC: 10 10*3/uL (ref 4.0–10.5)
nRBC: 0 % (ref 0.0–0.2)

## 2023-03-20 LAB — HEMOGLOBIN A1C
Hgb A1c MFr Bld: 9.5 % — ABNORMAL HIGH (ref 4.8–5.6)
Mean Plasma Glucose: 226 mg/dL

## 2023-03-20 LAB — BASIC METABOLIC PANEL
Anion gap: 9 (ref 5–15)
BUN: 17 mg/dL (ref 6–20)
CO2: 24 mmol/L (ref 22–32)
Calcium: 8.8 mg/dL — ABNORMAL LOW (ref 8.9–10.3)
Chloride: 101 mmol/L (ref 98–111)
Creatinine, Ser: 0.54 mg/dL (ref 0.44–1.00)
GFR, Estimated: >60 mL/min (ref >60)
Glucose, Bld: 165 mg/dL — ABNORMAL HIGH (ref 70–99)
Potassium: 3.3 mmol/L — ABNORMAL LOW (ref 3.5–5.1)
Sodium: 134 mmol/L — ABNORMAL LOW (ref 135–145)

## 2023-03-20 LAB — GLUCOSE, CAPILLARY
Glucose-Capillary: 182 mg/dL — ABNORMAL HIGH (ref 70–99)
Glucose-Capillary: 310 mg/dL — ABNORMAL HIGH (ref 70–99)
Glucose-Capillary: 373 mg/dL — ABNORMAL HIGH (ref 70–99)
Glucose-Capillary: 239 mg/dL — ABNORMAL HIGH (ref 70–99)

## 2023-03-20 MED ORDER — INSULIN GLARGINE-YFGN 100 UNIT/ML ~~LOC~~ SOLN
5.0000 [IU] | Freq: Once | SUBCUTANEOUS | Status: AC
  Administered 2023-03-20: 5 [IU] via SUBCUTANEOUS
  Filled 2023-03-20: qty 0.05

## 2023-03-20 MED ORDER — POTASSIUM CHLORIDE CRYS ER 20 MEQ PO TBCR
40.0000 meq | EXTENDED_RELEASE_TABLET | ORAL | Status: AC
  Administered 2023-03-20 (×2): 40 meq via ORAL
  Filled 2023-03-20 (×2): qty 2

## 2023-03-20 MED ORDER — BENZONATATE 100 MG PO CAPS
200.0000 mg | ORAL_CAPSULE | Freq: Three times a day (TID) | ORAL | Status: DC | PRN
  Administered 2023-03-20 – 2023-03-24 (×8): 200 mg via ORAL
  Filled 2023-03-20 (×8): qty 2

## 2023-03-20 MED ORDER — MOMETASONE FURO-FORMOTEROL FUM 100-5 MCG/ACT IN AERO
2.0000 | INHALATION_SPRAY | Freq: Two times a day (BID) | RESPIRATORY_TRACT | Status: DC
  Administered 2023-03-20 – 2023-03-24 (×9): 2 via RESPIRATORY_TRACT
  Filled 2023-03-20: qty 8.8

## 2023-03-20 MED ORDER — INSULIN GLARGINE-YFGN 100 UNIT/ML ~~LOC~~ SOLN
20.0000 [IU] | Freq: Every day | SUBCUTANEOUS | Status: DC
  Filled 2023-03-20: qty 0.2

## 2023-03-20 NOTE — Evaluation (Signed)
 Occupational Therapy Evaluation Patient Details Name: Bobbye Petti MRN: [id number] DOB: [birth year] Today's Date: 03/20/2023   History of Present Illness 58 yr old female admitted to the hospital 02-3023 with RSV infection. PMH: obesity, diabetes type 2, diabetic neuropathy, osteoarthritis of the knee bilaterally, chronic abdominal pain, GERD,  H. pylori infection, ventral hernia s/p repair.   Clinical Impression   The pt performed all assessed tasks without the need for physical assistance, including lower body dressing, sit to stand, ambulating in her room without an assistive device, a toilet transfer at bathroom level, and handwashing standing at the sink. Her O2 saturation was noted to be 91% on room air with activity. She denied feelings of shortness of breath, though reported increased wheezing. She does not require further OT services, therefore OT will sign off and recommend she return home at discharge.        If plan is discharge home, recommend the following: Other (comment) (N/A)    Functional Status Assessment  Patient has not had a recent decline in their functional status  Equipment Recommendations  None recommended by OT    Recommendations for Other Services       Precautions / Restrictions Precautions Precautions: Fall Restrictions Weight Bearing Restrictions Per Provider Order: No      Mobility Bed Mobility      General bed mobility comments: in recliner    Transfers Overall transfer level: Independent   Transfers: Sit to/from Stand                    Balance Overall balance assessment: No apparent balance deficits (not formally assessed)           ADL either performed or assessed with clinical judgement   ADL Overall ADL's : Independent;At baseline                        Pertinent Vitals/Pain Pain Assessment Pain Assessment:  (She denied having pain however reported a sore throat from coughing)     Extremity/Trunk  Assessment Upper Extremity Assessment Upper Extremity Assessment: Overall WFL for tasks assessed;Right hand dominant   Lower Extremity Assessment Lower Extremity Assessment: Overall WFL for tasks assessed       Communication Communication Communication: No apparent difficulties   Cognition Arousal: Alert Behavior During Therapy: WFL for tasks assessed/performed Overall Cognitive Status: Within Functional Limits for tasks assessed                     Home Living Family/patient expects to be discharged to:: Private residence Living Arrangements: Spouse/significant other;Child/daughter Available Help at Discharge: Family Type of Home: House Home Access: Stairs to enter Secretary/administrator of Steps: 2 Entrance Stairs-Rails: Left;Right Home Layout: One level     Bathroom Shower/Tub: Dietitian: None          Prior Functioning/Environment Prior Level of Function : Independent/Modified Independent;Driving                   OT Treatment/Interventions:   N/A   OT Goals(Current goals can be found in the care plan section) Acute Rehab OT Goals OT Goal Formulation: All assessment and education complete, DC therapy  OT Frequency:         AM-PAC OT 6 Clicks Daily Activity     Outcome Measure Help from another person eating meals?: None Help from another person taking care of personal grooming?: None  Help from another person toileting, which includes using toliet, bedpan, or urinal?: None Help from another person bathing (including washing, rinsing, drying)?: None Help from another person to put on and taking off regular upper body clothing?: None Help from another person to put on and taking off regular lower body clothing?: None 6 Click Score: 24   End of Session Equipment Utilized During Treatment: Other (comment) (N/A) Nurse Communication: Other (comment) (nurse cleared pt for therapy participation)  Activity Tolerance:  Patient tolerated treatment well Patient left: in chair;with call bell/phone within reach;with family/visitor present  OT Visit Diagnosis: Muscle weakness (generalized) (M62.81)                Time: 8498-8485 OT Time Calculation (min): 13 min Charges:  OT General Charges $OT Visit: 1 Visit OT Evaluation $OT Eval Low Complexity: 1 Low    Anquan Azzarello L Ehab Humber, OTR/L 03/20/2023, 3:31 PM

## 2023-03-20 NOTE — Evaluation (Signed)
 Physical Therapy Evaluation Patient Details Name: Rita Bass MRN: [id number] DOB: 24-Jun-1965 Today's Date: 03/20/2023  History of Present Illness  35 with PMH: severe morbid obesity, diabetes type 2, diabetic neuropathy, osteoarthritis of the knee bilaterally, chronic abdominal pain, GERD,  H. pylori infection, ventral hernia s/p repair presents complaining of shortness of breath and wheezing.  Patient was found to be tachypneic, tachycardic, bilateral wheezing, RSV PCR positive.  Chest x-ray negative.  Clinical Impression  Patient evaluated by Physical Therapy with no further acute PT needs identified. All education has been completed and the patient has no further questions.  Pt amb ~ 120' with no device to occasional rail touch in hallway, admits to furniture amb at home on a consistent basis. Has assist from spouse and dtr if needed. SpO2=97% on RA, HR max of 114 with activity  See below for any follow-up Physical Therapy or equipment needs. PT is signing off. Thank you for this referral.         If plan is discharge home, recommend the following: Assist for transportation;Help with stairs or ramp for entrance   Can travel by private vehicle        Equipment Recommendations None recommended by PT  Recommendations for Other Services       Functional Status Assessment Patient has not had a recent decline in their functional status     Precautions / Restrictions Precautions Precautions: Fall      Mobility  Bed Mobility               General bed mobility comments: in recliner    Transfers Overall transfer level: Needs assistance   Transfers: Sit to/from Stand Sit to Stand: Supervision           General transfer comment: for safety, no physical assist    Ambulation/Gait Ambulation/Gait assistance: Supervision, Contact guard assist Gait Distance (Feet): 120 Feet Assistive device: None Gait Pattern/deviations: Step-through pattern       General  Gait Details: occaisonal use of rail, no overt LOB, 2 brief standing rests d/t coughing (pt endorses furniture amb at home)  Stairs            Wheelchair Mobility     Tilt Bed    Modified Rankin (Stroke Patients Only)       Balance Overall balance assessment: Mild deficits observed, not formally tested                                           Pertinent Vitals/Pain Pain Assessment Pain Assessment: No/denies pain (sore throat and hurts to cough)    Home Living Family/patient expects to be discharged to:: Private residence Living Arrangements: Spouse/significant other;Children Available Help at Discharge: Family;Available 24 hours/day Type of Home: House Home Access: Stairs to enter Entrance Stairs-Rails: Right;Left;Can reach both Entrance Stairs-Number of Steps: 2-3   Home Layout: One level Home Equipment: None      Prior Function Prior Level of Function : Independent/Modified Independent                     Extremity/Trunk Assessment   Upper Extremity Assessment Upper Extremity Assessment: Overall WFL for tasks assessed    Lower Extremity Assessment Lower Extremity Assessment: Overall WFL for tasks assessed       Communication   Communication Communication: No apparent difficulties  Cognition Arousal: Alert Behavior During Therapy: [facility] for  tasks assessed/performed Overall Cognitive Status: Within Functional Limits for tasks assessed                                          General Comments      Exercises     Assessment/Plan    PT Assessment Patient does not need any further PT services  PT Problem List         PT Treatment Interventions      PT Goals (Current goals can be found in the Care Plan section)  Acute Rehab PT Goals PT Goal Formulation: All assessment and education complete, DC therapy    Frequency       Co-evaluation               AM-PAC PT 6 Clicks Mobility  Outcome  Measure Help needed turning from your back to your side while in a flat bed without using bedrails?: None Help needed moving from lying on your back to sitting on the side of a flat bed without using bedrails?: None Help needed moving to and from a bed to a chair (including a wheelchair)?: None Help needed standing up from a chair using your arms (e.g., wheelchair or bedside chair)?: None Help needed to walk in hospital room?: None Help needed climbing 3-5 steps with a railing? : A Little 6 Click Score: 23    End of Session   Activity Tolerance: Patient tolerated treatment well Patient left: in chair;with call bell/phone within reach (no alarm on arrival)   PT Visit Diagnosis: Other abnormalities of gait and mobility (R26.89)    Time: 8644-8591 PT Time Calculation (min) (ACUTE ONLY): 13 min   Charges:   PT Evaluation $PT Eval Low Complexity: 1 Low   PT General Charges $$ ACUTE PT VISIT: 1 Visit         Alphonza Tramell, PT  Acute Rehab Dept ([facility]) [phone]  03/20/2023   [facility] 03/20/2023, 3:05 PM

## 2023-03-20 NOTE — Progress Notes (Signed)
 PROGRESS NOTE    Rita Bass  FMW:[id number] DOB: 07/12/1965 DOA: 03/18/2023 PCP: [name], MD   Brief Narrative: 67 with past medical history significant for severe morbid obesity, diabetes type 2, diabetic neuropathy, osteoarthritis of the knee bilaterally, chronic abdominal pain, GERD, history of H. pylori infection, ventral hernia s/p repair presents complaining of shortness of breath and wheezing.  Patient was found to be tachypneic, tachycardic, bilateral wheezing, RSV PCR positive.  Chest x-ray negative.   Assessment & Plan:   Principal Problem:   Dyspnea  1-Acute respiratory distress secondary to RSV infection -Patient presented with dyspnea, tachycardia, tachypnea RSV positive.  Chest x-ray: Low lung volumes with mild, chronic appearing increased interstitial lung markings -Continue with IV ceftriaxone to cover for superimposed PNA.  -Continue with Duoneb  -Continue  with  Dulera .  -Continue with IV solumedrol.  Support care.  -Continue with  flutter valve.  Not feeling better, still SOB>   Diabetes type 2 with hyperglycemia, Uncontrolled.  -Increase  Semglee . Continue SSI Will add meals coverage.  Worsening hyperglycemia in setting steroids.   Diabetic polyneuropathy: -Continue with duloxetine  and gabapentin   Severe morbid obesity: -Need lifestyle modification  Hypokalemia: -Replete orally.   Generalized weakness: - PT ordered.   Hyponatremia: -Monitor.   HTN;  -started on low dose Norvasc .   Morbid obesity. Needs life style modification.    Estimated body mass index is 55.96 kg/m as calculated from the following:   Height as of this encounter: 5' 4 (1.626 m).   Weight as of this encounter: 147.9 kg.   DVT prophylaxis: Lovenox  Code Status: Full code Family Communication: daughter at bedside.  Disposition Plan:  Status is: Observation The patient remains OBS appropriate and will d/c before 2 midnights.    Consultants:   None  Procedures:  none Antimicrobials:    Subjective: Not feeling better, still SOB, wheezing and coughing.   Objective: Vitals:   03/19/23 2229 03/20/23 0458 03/20/23 0838 03/20/23 1210  BP: 139/89 (!) 141/85  124/77  Pulse: 93 81  90  Resp: 18 18  20   Temp: 97.7 F (36.5 C) 97.8 F (36.6 C)  98.4 F (36.9 C)  TempSrc: Oral Oral  Oral  SpO2: 94% 94% 97% 95%  Weight:      Height:        Intake/Output Summary (Last 24 hours) at 03/20/2023 1310 Last data filed at 03/20/2023 1207 Gross per 24 hour  Intake 1702.69 ml  Output --  Net 1702.69 ml   Filed Weights   03/19/23 0726  Weight: (!) 147.9 kg    Examination:  General exam: NAD, Morbid obese Respiratory system: Bl ronchus.  Cardiovascular system: S 1, S 2 RRR Gastrointestinal system: BS present, Soft, NT Central nervous system: alert Extremities:no edema   Data Reviewed: I have personally reviewed following labs and imaging studies  CBC: Recent Labs  Lab 03/18/23 2230 03/19/23 0201 03/20/23 0835  WBC 7.2 7.4 10.0  NEUTROABS 4.1  --   --   HGB 13.4 13.3 12.5  HCT 42.1 40.5 40.8  MCV 76.8* 76.3* 77.6*  PLT 285 254 283   Basic Metabolic Panel: Recent Labs  Lab 03/18/23 2230 03/19/23 0201 03/20/23 0835  NA 134* 134* 134*  K 3.1* 3.6 3.3*  CL 97* 101 101  CO2 21* 21* 24  GLUCOSE 240* 266* 165*  BUN 5* 8 17  CREATININE 0.48 0.55  0.34* 0.54  CALCIUM  8.4* 9.0 8.8*  MG  --  2.0  --  PHOS  --  2.5  --    GFR: Estimated Creatinine Clearance: 115.4 mL/min (by C-G formula based on SCr of 0.54 mg/dL). Liver Function Tests: Recent Labs  Lab 03/18/23 2230  AST 22  ALT 25  ALKPHOS 79  BILITOT 0.6  PROT 8.2*  ALBUMIN 4.1   No results for input(s): LIPASE, AMYLASE in the last 168 hours. No results for input(s): AMMONIA in the last 168 hours. Coagulation Profile: No results for input(s): INR, PROTIME in the last 168 hours. Cardiac Enzymes: No results for input(s): CKTOTAL,  CKMB, CKMBINDEX, TROPONINI in the last 168 hours. BNP (last 3 results) No results for input(s): PROBNP in the last 8760 hours. HbA1C: Recent Labs    03/19/23 0201  HGBA1C 9.5*   CBG: Recent Labs  Lab 03/19/23 1203 03/19/23 1628 03/19/23 2228 03/20/23 0725 03/20/23 1146  GLUCAP 406* 280* 176* 182* 310*   Lipid Profile: No results for input(s): CHOL, HDL, LDLCALC, TRIG, CHOLHDL, LDLDIRECT in the last 72 hours. Thyroid Function Tests: No results for input(s): TSH, T4TOTAL, FREET4, T3FREE, THYROIDAB in the last 72 hours. Anemia Panel: No results for input(s): VITAMINB12, FOLATE, FERRITIN, TIBC, IRON, RETICCTPCT in the last 72 hours. Sepsis Labs: No results for input(s): PROCALCITON, LATICACIDVEN in the last 168 hours.  Recent Results (from the past 240 hours)  Resp panel by RT-PCR (RSV, Flu A&B, Covid) Anterior Nasal Swab     Status: Abnormal   Collection Time: 03/18/23 10:29 PM   Specimen: Anterior Nasal Swab  Result Value Ref Range Status   SARS Coronavirus 2 by RT PCR NEGATIVE NEGATIVE Final    Comment: (NOTE) SARS-CoV-2 target nucleic acids are NOT DETECTED.  The SARS-CoV-2 RNA is generally detectable in upper respiratory specimens during the acute phase of infection. The lowest concentration of SARS-CoV-2 viral copies this assay can detect is 138 copies/mL. A negative result does not preclude SARS-Cov-2 infection and should not be used as the sole basis for treatment or other patient management decisions. A negative result may occur with  improper specimen collection/handling, submission of specimen other than nasopharyngeal swab, presence of viral mutation(s) within the areas targeted by this assay, and inadequate number of viral copies(<138 copies/mL). A negative result must be combined with clinical observations, patient history, and epidemiological information. The expected result is Negative.  Fact Sheet for  Patients:  bloggercourse.com  Fact Sheet for Healthcare Providers:  seriousbroker.it  This test is no t yet approved or cleared by the [place]  FDA and  has been authorized for detection and/or diagnosis of SARS-CoV-2 by FDA under an Emergency Use Authorization (EUA). This EUA will remain  in effect (meaning this test can be used) for the duration of the COVID-19 declaration under Section 564(b)(1) of the Act, 21 U.S.C.section 360bbb-3(b)(1), unless the authorization is terminated  or revoked sooner.       Influenza A by PCR NEGATIVE NEGATIVE Final   Influenza B by PCR NEGATIVE NEGATIVE Final    Comment: (NOTE) The Xpert Xpress SARS-CoV-2/FLU/RSV plus assay is intended as an aid in the diagnosis of influenza from Nasopharyngeal swab specimens and should not be used as a sole basis for treatment. Nasal washings and aspirates are unacceptable for Xpert Xpress SARS-CoV-2/FLU/RSV testing.  Fact Sheet for Patients: bloggercourse.com  Fact Sheet for Healthcare Providers: seriousbroker.it  This test is not yet approved or cleared by the [place]  FDA and has been authorized for detection and/or diagnosis of SARS-CoV-2 by FDA under an Emergency Use Authorization (EUA). This EUA  will remain in effect (meaning this test can be used) for the duration of the COVID-19 declaration under Section 564(b)(1) of the Act, 21 U.S.C. section 360bbb-3(b)(1), unless the authorization is terminated or revoked.     Resp Syncytial Virus by PCR POSITIVE (A) NEGATIVE Final    Comment: (NOTE) Fact Sheet for Patients: bloggercourse.com  Fact Sheet for Healthcare Providers: seriousbroker.it  This test is not yet approved or cleared by the [place]  FDA and has been authorized for detection and/or diagnosis of SARS-CoV-2 by FDA under an  Emergency Use Authorization (EUA). This EUA will remain in effect (meaning this test can be used) for the duration of the COVID-19 declaration under Section 564(b)(1) of the Act, 21 U.S.C. section 360bbb-3(b)(1), unless the authorization is terminated or revoked.  Performed at [facility], [address] [place], [place] [address]          Radiology Studies: DG Chest 2 View Result Date: 03/18/2023 CLINICAL DATA:  Cough with shortness of breath and diarrhea. EXAM: CHEST - 2 VIEW COMPARISON:  January 26, 2022 FINDINGS: The heart size and mediastinal contours are within normal limits. Low lung volumes are noted. Mild, chronic appearing increased interstitial lung markings are also seen. No pleural effusion or pneumothorax is identified. Multilevel degenerative changes are present throughout the thoracic spine. IMPRESSION: Low lung volumes with mild, chronic appearing increased interstitial lung markings. Electronically Signed   By: Suzen Dials M.D.   On: 03/18/2023 21:22        Scheduled Meds:  amLODipine   2.5 mg Oral Daily   DULoxetine   20 mg Oral Daily   enoxaparin  (LOVENOX ) injection  70 mg Subcutaneous Q24H   feeding supplement  237 mL Oral BID BM   gabapentin   100 mg Oral BID   guaiFENesin   1,200 mg Oral BID   insulin  aspart  0-15 Units Subcutaneous TID WC   insulin  aspart  0-5 Units Subcutaneous QHS   insulin  aspart  4 Units Subcutaneous TID WC   [START ON 03/21/2023] insulin  glargine-yfgn  20 Units Subcutaneous Daily   ipratropium-albuterol   3 mL Nebulization TID   loratadine   10 mg Oral Daily   methylPREDNISolone  (SOLU-MEDROL ) injection  40 mg Intravenous Daily   mometasone -formoterol   2 puff Inhalation BID   potassium chloride   40 mEq Oral Q4H   sodium chloride  HYPERTONIC  4 mL Nebulization BID   Continuous Infusions:  azithromycin  500 mg (03/20/23 0925)     LOS: 1 day    Time spent: 35 minutes   Swayzee Wadley A Azzam Mehra, MD [facility]   If 7PM-7AM, please contact night-coverage [email or web address]  03/20/2023, 1:10 PM

## 2023-03-21 ENCOUNTER — Other Ambulatory Visit: Payer: Self-pay | Admitting: Sports Medicine

## 2023-03-21 DIAGNOSIS — B338 Other specified viral diseases: Secondary | ICD-10-CM | POA: Diagnosis not present

## 2023-03-21 LAB — GLUCOSE, CAPILLARY
Glucose-Capillary: 123 mg/dL — ABNORMAL HIGH (ref 70–99)
Glucose-Capillary: 235 mg/dL — ABNORMAL HIGH (ref 70–99)
Glucose-Capillary: 251 mg/dL — ABNORMAL HIGH (ref 70–99)
Glucose-Capillary: 210 mg/dL — ABNORMAL HIGH (ref 70–99)

## 2023-03-21 LAB — BASIC METABOLIC PANEL
Anion gap: 8 (ref 5–15)
BUN: 14 mg/dL (ref 6–20)
CO2: 24 mmol/L (ref 22–32)
Calcium: 8.7 mg/dL — ABNORMAL LOW (ref 8.9–10.3)
Chloride: 104 mmol/L (ref 98–111)
Creatinine, Ser: 0.39 mg/dL — ABNORMAL LOW (ref 0.44–1.00)
GFR, Estimated: >60 mL/min (ref >60)
Glucose, Bld: 127 mg/dL — ABNORMAL HIGH (ref 70–99)
Potassium: 3.5 mmol/L (ref 3.5–5.1)
Sodium: 136 mmol/L (ref 135–145)

## 2023-03-21 MED ORDER — INSULIN GLARGINE-YFGN 100 UNIT/ML ~~LOC~~ SOLN
25.0000 [IU] | Freq: Every day | SUBCUTANEOUS | Status: DC
  Administered 2023-03-21 – 2023-03-23 (×3): 25 [IU] via SUBCUTANEOUS
  Filled 2023-03-21 (×3): qty 0.25

## 2023-03-21 MED ORDER — EMPAGLIFLOZIN 25 MG PO TABS
25.0000 mg | ORAL_TABLET | Freq: Every day | ORAL | Status: DC
  Administered 2023-03-21 – 2023-03-24 (×4): 25 mg via ORAL
  Filled 2023-03-21 (×4): qty 1

## 2023-03-21 MED ORDER — PREDNISONE 20 MG PO TABS
40.0000 mg | ORAL_TABLET | Freq: Every day | ORAL | Status: AC
  Administered 2023-03-22 – 2023-03-24 (×3): 40 mg via ORAL
  Filled 2023-03-21 (×3): qty 2

## 2023-03-21 MED ORDER — IPRATROPIUM-ALBUTEROL 0.5-2.5 (3) MG/3ML IN SOLN
3.0000 mL | Freq: Two times a day (BID) | RESPIRATORY_TRACT | Status: DC
  Administered 2023-03-21 – 2023-03-24 (×6): 3 mL via RESPIRATORY_TRACT
  Filled 2023-03-21 (×6): qty 3

## 2023-03-21 NOTE — TOC CM/SW Note (Signed)
 Transition of Care [facility]) - Inpatient Brief Assessment   Patient Details  Name: Rita Bass MRN: [id number] Date of Birth: 1966/01/14  Transition of Care [facility]) CM/SW Contact:    Tawni CHRISTELLA Eva, LCSW Phone Number: 03/21/2023, 4:02 PM   Clinical Narrative:  Transition of Care Department [facility]) has reviewed patient and no TOC needs have been identified at this time. We will continue to monitor patient advancement through interdisciplinary progression rounds. If new patient transition needs arise, please place a TOC consult.  Transition of Care Asessment: Insurance and Status: Insurance coverage has been reviewed Patient has primary care physician: Yes Home environment has been reviewed: home with self Prior level of function:: mod independent Prior/Current Home Services: No current home services Social Drivers of Health Review: SDOH reviewed no interventions necessary Readmission risk has been reviewed: Yes Transition of care needs: no transition of care needs at this time

## 2023-03-21 NOTE — Progress Notes (Signed)
 Mobility Specialist - Progress Note   03/21/23 1113  Oxygen Therapy  SpO2 (!) 89 %  O2 Device Room Air  Patient Activity (if Appropriate) Ambulating  Mobility  Activity Ambulated independently in hallway  Level of Assistance Independent  Assistive Device None  Distance Ambulated (ft) 80 ft  Activity Response Tolerated well  Mobility Referral Yes  Mobility visit 1 Mobility  Mobility Specialist Start Time (ACUTE ONLY) 1103  Mobility Specialist Stop Time (ACUTE ONLY) 1112  Mobility Specialist Time Calculation (min) (ACUTE ONLY) 9 min   Pt received in recliner and agreeable to mobility. Ambulation cut short d/t high HR. Upon returning to room, pt desat to 88%. Encouraged pursed lip breaths bringing SpO2 back up to 91%. Pt to recliner after session with all needs met. RN made aware.   Pre-mobility: 120 HR, 90% SpO2 (RA) During mobility: 125 HR, 89% SpO2 (RA) Post-mobility: 119 HR, 88-91% SPO2 (RA)  Chief Technology Officer

## 2023-03-21 NOTE — Progress Notes (Signed)
 PROGRESS NOTE    Rita Bass  FMW:[id number] DOB: [birth year] DOA: 03/18/2023 PCP: [name], MD   Brief Narrative: 10 with past medical history significant for severe morbid obesity, diabetes type 2, diabetic neuropathy, osteoarthritis of the knee bilaterally, chronic abdominal pain, GERD, history of H. pylori infection, ventral hernia s/p repair presents complaining of shortness of breath and wheezing.  Patient was found to be tachypneic, tachycardic, bilateral wheezing, RSV PCR positive.  Chest x-ray negative.   Assessment & Plan:   Principal Problem:   Dyspnea Active Problems:   Morbid obesity (HCC)   Sleep apnea   Type 2 diabetes mellitus with complication (HCC)   Diabetic nephropathy associated with type 2 diabetes mellitus (HCC)   Essential hypertension  1-Acute respiratory distress secondary to RSV infection Acute Bronchitis.  -Patient presented with dyspnea, tachycardia, tachypnea RSV positive.  Chest x-ray: Low lung volumes with mild, chronic appearing increased interstitial lung markings -Continue with IV ceftriaxone to cover for superimposed PNA.  -Continue with Duoneb, Dulera .  -Change Solumedrol to prednisone  Support care.  -Continue with  flutter valve.  -Continue with current management.   Diabetes type 2 with hyperglycemia, Uncontrolled.  -Increase  Semglee  25 units. Continue SSI -Added  meals coverage.  -Worsening hyperglycemia in setting steroids.   Diabetic polyneuropathy: -Continue with duloxetine  and gabapentin   Severe morbid obesity: -Need lifestyle modification  Hypokalemia: -Replete orally.   Generalized weakness: - PT ordered.   Hyponatremia: -Monitor.   HTN;  -started on low dose Norvasc .   Morbid obesity. Needs life style modification.    Estimated body mass index is 55.96 kg/m as calculated from the following:   Height as of this encounter: 5' 4 (1.626 m).   Weight as of this encounter: 147.9 kg.   DVT  prophylaxis: Lovenox  Code Status: Full code Family Communication: daughter at bedside.  Disposition Plan:  Status is: Observation The patient remains OBS appropriate and will d/c before 2 midnights.    Consultants:  None  Procedures:  none Antimicrobials:    Subjective: \still report cough, SOB, doesn't feel ready today to go home.   Objective: Vitals:   03/21/23 0428 03/21/23 0915 03/21/23 1113 03/21/23 1144  BP: 136/78   137/88  Pulse: 74   86  Resp: 18   18  Temp: 97.8 F (36.6 C)   98 F (36.7 C)  TempSrc: Oral   Oral  SpO2: 95% 98% (!) 89% 92%  Weight:      Height:       No intake or output data in the 24 hours ending 03/21/23 1341  Filed Weights   03/19/23 0726  Weight: (!) 147.9 kg    Examination:  General exam: Morbis Obese, NAD Respiratory system: BL ronchus, less wheezing.  Cardiovascular system: S 1, S 2 RRR Gastrointestinal system: BS [resent, soft, nt Central nervous system:  Alert Extremities:no edema   Data Reviewed: I have personally reviewed following labs and imaging studies  CBC: Recent Labs  Lab 03/18/23 2230 03/19/23 0201 03/20/23 0835  WBC 7.2 7.4 10.0  NEUTROABS 4.1  --   --   HGB 13.4 13.3 12.5  HCT 42.1 40.5 40.8  MCV 76.8* 76.3* 77.6*  PLT 285 254 283   Basic Metabolic Panel: Recent Labs  Lab 03/18/23 2230 03/19/23 0201 03/20/23 0835 03/21/23 0817  NA 134* 134* 134* 136  K 3.1* 3.6 3.3* 3.5  CL 97* 101 101 104  CO2 21* 21* 24 24  GLUCOSE 240* 266* 165* 127*  BUN  5* 8 17 14   CREATININE 0.48 0.55  0.34* 0.54 0.39*  CALCIUM  8.4* 9.0 8.8* 8.7*  MG  --  2.0  --   --   PHOS  --  2.5  --   --    GFR: Estimated Creatinine Clearance: 115.4 mL/min (A) (by C-G formula based on SCr of 0.39 mg/dL (L)). Liver Function Tests: Recent Labs  Lab 03/18/23 2230  AST 22  ALT 25  ALKPHOS 79  BILITOT 0.6  PROT 8.2*  ALBUMIN 4.1   No results for input(s): LIPASE, AMYLASE in the last 168 hours. No results for  input(s): AMMONIA in the last 168 hours. Coagulation Profile: No results for input(s): INR, PROTIME in the last 168 hours. Cardiac Enzymes: No results for input(s): CKTOTAL, CKMB, CKMBINDEX, TROPONINI in the last 168 hours. BNP (last 3 results) No results for input(s): PROBNP in the last 8760 hours. HbA1C: Recent Labs    03/19/23 0201  HGBA1C 9.5*   CBG: Recent Labs  Lab 03/20/23 1146 03/20/23 1559 03/20/23 2107 03/21/23 0733 03/21/23 1142  GLUCAP 310* 373* 239* 123* 235*   Lipid Profile: No results for input(s): CHOL, HDL, LDLCALC, TRIG, CHOLHDL, LDLDIRECT in the last 72 hours. Thyroid Function Tests: No results for input(s): TSH, T4TOTAL, FREET4, T3FREE, THYROIDAB in the last 72 hours. Anemia Panel: No results for input(s): VITAMINB12, FOLATE, FERRITIN, TIBC, IRON, RETICCTPCT in the last 72 hours. Sepsis Labs: No results for input(s): PROCALCITON, LATICACIDVEN in the last 168 hours.  Recent Results (from the past 240 hours)  Resp panel by RT-PCR (RSV, Flu A&B, Covid) Anterior Nasal Swab     Status: Abnormal   Collection Time: 03/18/23 10:29 PM   Specimen: Anterior Nasal Swab  Result Value Ref Range Status   SARS Coronavirus 2 by RT PCR NEGATIVE NEGATIVE Final    Comment: (NOTE) SARS-CoV-2 target nucleic acids are NOT DETECTED.  The SARS-CoV-2 RNA is generally detectable in upper respiratory specimens during the acute phase of infection. The lowest concentration of SARS-CoV-2 viral copies this assay can detect is 138 copies/mL. A negative result does not preclude SARS-Cov-2 infection and should not be used as the sole basis for treatment or other patient management decisions. A negative result may occur with  improper specimen collection/handling, submission of specimen other than nasopharyngeal swab, presence of viral mutation(s) within the areas targeted by this assay, and inadequate number of viral copies(<138  copies/mL). A negative result must be combined with clinical observations, patient history, and epidemiological information. The expected result is Negative.  Fact Sheet for Patients:  bloggercourse.com  Fact Sheet for Healthcare Providers:  seriousbroker.it  This test is no t yet approved or cleared by the [place]  FDA and  has been authorized for detection and/or diagnosis of SARS-CoV-2 by FDA under an Emergency Use Authorization (EUA). This EUA will remain  in effect (meaning this test can be used) for the duration of the COVID-19 declaration under Section 564(b)(1) of the Act, 21 U.S.C.section 360bbb-3(b)(1), unless the authorization is terminated  or revoked sooner.       Influenza A by PCR NEGATIVE NEGATIVE Final   Influenza B by PCR NEGATIVE NEGATIVE Final    Comment: (NOTE) The Xpert Xpress SARS-CoV-2/FLU/RSV plus assay is intended as an aid in the diagnosis of influenza from Nasopharyngeal swab specimens and should not be used as a sole basis for treatment. Nasal washings and aspirates are unacceptable for Xpert Xpress SARS-CoV-2/FLU/RSV testing.  Fact Sheet for Patients: bloggercourse.com  Fact Sheet for Healthcare  Providers: seriousbroker.it  This test is not yet approved or cleared by the [place]  FDA and has been authorized for detection and/or diagnosis of SARS-CoV-2 by FDA under an Emergency Use Authorization (EUA). This EUA will remain in effect (meaning this test can be used) for the duration of the COVID-19 declaration under Section 564(b)(1) of the Act, 21 U.S.C. section 360bbb-3(b)(1), unless the authorization is terminated or revoked.     Resp Syncytial Virus by PCR POSITIVE (A) NEGATIVE Final    Comment: (NOTE) Fact Sheet for Patients: bloggercourse.com  Fact Sheet for Healthcare  Providers: seriousbroker.it  This test is not yet approved or cleared by the [place]  FDA and has been authorized for detection and/or diagnosis of SARS-CoV-2 by FDA under an Emergency Use Authorization (EUA). This EUA will remain in effect (meaning this test can be used) for the duration of the COVID-19 declaration under Section 564(b)(1) of the Act, 21 U.S.C. section 360bbb-3(b)(1), unless the authorization is terminated or revoked.  Performed at [facility], [address] [place], [place] [address]          Radiology Studies: No results found.       Scheduled Meds:  amLODipine   2.5 mg Oral Daily   DULoxetine   20 mg Oral Daily   empagliflozin   25 mg Oral Daily   enoxaparin  (LOVENOX ) injection  70 mg Subcutaneous Q24H   feeding supplement  237 mL Oral BID BM   gabapentin   100 mg Oral BID   guaiFENesin   1,200 mg Oral BID   insulin  aspart  0-15 Units Subcutaneous TID WC   insulin  aspart  0-5 Units Subcutaneous QHS   insulin  aspart  4 Units Subcutaneous TID WC   insulin  glargine-yfgn  25 Units Subcutaneous Daily   ipratropium-albuterol   3 mL Nebulization BID   loratadine   10 mg Oral Daily   methylPREDNISolone  (SOLU-MEDROL ) injection  40 mg Intravenous Daily   mometasone -formoterol   2 puff Inhalation BID   sodium chloride  HYPERTONIC  4 mL Nebulization BID   Continuous Infusions:  azithromycin  500 mg (03/21/23 0748)     LOS: 2 days    Time spent: 35 minutes   Eilleen Davoli A Dorsey Authement, MD [facility]   If 7PM-7AM, please contact night-coverage [email or web address]  03/21/2023, 1:41 PM

## 2023-03-22 DIAGNOSIS — J121 Respiratory syncytial virus pneumonia: Secondary | ICD-10-CM | POA: Diagnosis not present

## 2023-03-22 DIAGNOSIS — R0609 Other forms of dyspnea: Secondary | ICD-10-CM | POA: Diagnosis not present

## 2023-03-22 LAB — GLUCOSE, CAPILLARY
Glucose-Capillary: 110 mg/dL — ABNORMAL HIGH (ref 70–99)
Glucose-Capillary: 222 mg/dL — ABNORMAL HIGH (ref 70–99)
Glucose-Capillary: 216 mg/dL — ABNORMAL HIGH (ref 70–99)
Glucose-Capillary: 183 mg/dL — ABNORMAL HIGH (ref 70–99)

## 2023-03-22 MED ORDER — INSULIN STARTER KIT- PEN NEEDLES (ENGLISH)
1.0000 | Freq: Once | Status: DC
  Filled 2023-03-22: qty 1

## 2023-03-22 MED ORDER — LIVING WELL WITH DIABETES BOOK
Freq: Once | Status: AC
  Filled 2023-03-22: qty 1

## 2023-03-22 NOTE — Progress Notes (Addendum)
 PROGRESS NOTE    Bailie Christenbury  FMW:[id number]  DOB: Jul 02, 1965  DOA: 03/18/2023 PCP: [name], MD Outpatient Specialists:   Hospital course:  58 year old female with severe morbid obesity, DM 2 complicated by neuropathy, chronic abdominal pain s/p ventral hernia repair was admitted for RSV infection presenting as worsening shortness of breath and wheezing.   Subjective:  Patient think she is doing better but notes she still has a cough and congestion.  Has not yet tried to walk in the hallway   Objective: Vitals:   03/21/23 2121 03/22/23 0413 03/22/23 0847 03/22/23 1129  BP: (!) 125/90 130/76  130/83  Pulse: (!) 102 88  99  Resp:    16  Temp: 97.8 F (36.6 C) 97.6 F (36.4 C)  98 F (36.7 C)  TempSrc: Oral Oral  Oral  SpO2: 94% 94% 96% 93%  Weight:      Height:        Intake/Output Summary (Last 24 hours) at 03/22/2023 1609 Last data filed at 03/22/2023 1135 Gross per 24 hour  Intake 240 ml  Output --  Net 240 ml   Filed Weights   03/19/23 0726  Weight: (!) 147.9 kg     Exam:  General: Obese patient with flat affect sitting up in bed in no acute respiratory distress Eyes: sclera anicteric, conjuctiva mild injection bilaterally CVS: S1-S2, regular  Respiratory:  decreased air entry bilaterally, no wheezes, does have some coarse fibrotic sounding crackles GI: NABS, soft, NT  LE: Warm and well-perfused Neuro: A/O x 3,  grossly nonfocal.   Data Reviewed:  Basic Metabolic Panel: Recent Labs  Lab 03/18/23 2230 03/19/23 0201 03/20/23 0835 03/21/23 0817  NA 134* 134* 134* 136  K 3.1* 3.6 3.3* 3.5  CL 97* 101 101 104  CO2 21* 21* 24 24  GLUCOSE 240* 266* 165* 127*  BUN 5* 8 17 14   CREATININE 0.48 0.55  0.34* 0.54 0.39*  CALCIUM  8.4* 9.0 8.8* 8.7*  MG  --  2.0  --   --   PHOS  --  2.5  --   --     CBC: Recent Labs  Lab 03/18/23 2230 03/19/23 0201 03/20/23 0835  WBC 7.2 7.4 10.0  NEUTROABS 4.1  --   --   HGB 13.4 13.3 12.5   HCT 42.1 40.5 40.8  MCV 76.8* 76.3* 77.6*  PLT 285 254 283     Scheduled Meds:  amLODipine   2.5 mg Oral Daily   DULoxetine   20 mg Oral Daily   empagliflozin   25 mg Oral Daily   enoxaparin  (LOVENOX ) injection  70 mg Subcutaneous Q24H   feeding supplement  237 mL Oral BID BM   gabapentin   100 mg Oral BID   guaiFENesin   1,200 mg Oral BID   insulin  aspart  0-15 Units Subcutaneous TID WC   insulin  aspart  0-5 Units Subcutaneous QHS   insulin  aspart  4 Units Subcutaneous TID WC   insulin  glargine-yfgn  25 Units Subcutaneous Daily   insulin  starter kit- pen needles  1 kit Other Once   ipratropium-albuterol   3 mL Nebulization BID   living well with diabetes book   Does not apply Once   loratadine   10 mg Oral Daily   mometasone -formoterol   2 puff Inhalation BID   predniSONE   40 mg Oral Q breakfast   Continuous Infusions:   Assessment & Plan:   RSV infection Acute bronchitis Patient is presently off of room air however still complaining of cough  and some shortness of breath even at rest She is responded well to inhaled bronchodilators and prednisone  Will discontinue ceftriaxone, continue azithromycin  Anticipate walking patient tomorrow off oxygen and discharge if she tolerates it--patient was walked today with following results: 88% w/ DOE on RA but 93% on 1L   HTN BP well-controlled with initiation of amlodipine  2.5  DM 2 complicated by neuropathy Hyperglycemia likely worsened in setting of steroids Will increase glargine to 28 units Continue preprandial aspart coverage and SSI Continue gabapentin   Hypokalemia Resolved with repletion  Hyponatremia Resolved with treatment      DVT prophylaxis: Lovenox  Code Status: Full code Family Communication: Family was at bedside     Studies: No results found.  Principal Problem:   Dyspnea Active Problems:   Morbid obesity (HCC)   Sleep apnea   Type 2 diabetes mellitus with complication (HCC)   Diabetic nephropathy  associated with type 2 diabetes mellitus (HCC)   Essential hypertension     Milan Clare Vangie Pike, [facility]  If 7PM-7AM, please contact night-coverage [email or web address]   LOS: 3 days

## 2023-03-22 NOTE — Inpatient Diabetes Management (Signed)
 Inpatient Diabetes Program Recommendations  AACE/ADA: New Consensus Statement on Inpatient Glycemic Control (2015)  Target Ranges:  Prepandial:   less than 140 mg/dL      Peak postprandial:   less than 180 mg/dL (1-2 hours)      Critically ill patients:  140 - 180 mg/dL   Lab Results  Component Value Date   GLUCAP 222 (H) 03/22/2023   HGBA1C 9.5 (H) 03/19/2023    Review of Glycemic Control  Diabetes history: DM2 Outpatient Diabetes medications: Jardiance  25 daily, metformin  1000 mg BID, Ozempic  2 mg weekly (just started back 2 weeks ago Current orders for Inpatient glycemic control: Jardiance  25 daily,Semglee  25 daily, Novolog  0-15 TID with meals and 0-5 HS + 4 units TID  HgbA1C - 9.5%  Inpatient Diabetes Program Recommendations:    For discharge:  Lantus  (Semglee ) 25 units daily  Spoke with pt at bedside regarding her diabetes and HgbA1C of 9.5%. Pt states her PCP had mentioned she may need to go on insulin , but held off since she was just restarting Ozempic . Pt states she has made some dietary changes, only drinks diet soda, unsweetened tea and no juices. States she hasn't been able to exercise with recent knee pain. Discussed glucose and A1C goals. Discussed importance of checking CBGs and maintaining good CBG control to prevent long-term and short-term complications. Explained how hyperglycemia leads to damage within blood vessels which lead to the common complications seen with uncontrolled diabetes. Stressed to the patient the importance of improving glycemic control to prevent further complications from uncontrolled diabetes. Discussed impact of nutrition, exercise, stress, sickness, and medications on diabetes control.  Discussed carbohydrates, portion control and choosing high fiber carbs such as beans, whole grains, brown rice, etc.  States she is willing to start insulin  if needed. Demonstrated insulin  pen administration. Pt is familiar with giving Ozempic  injections. Stressed  importance of monitoring blood sugars at least 3x/day and taking logbook to PCP for review. Explained how the doctor can use the log book to continue to make adjustments with DM medications if needed.  Patient verbalized understanding of information discussed and reports no further questions at this time related to diabetes.  Continue to follow.  Thank you. Shona Brandy, RD, LDN, CDCES Inpatient Diabetes Coordinator [phone]

## 2023-03-23 DIAGNOSIS — R0609 Other forms of dyspnea: Secondary | ICD-10-CM | POA: Diagnosis not present

## 2023-03-23 DIAGNOSIS — J121 Respiratory syncytial virus pneumonia: Secondary | ICD-10-CM | POA: Diagnosis not present

## 2023-03-23 LAB — GLUCOSE, CAPILLARY
Glucose-Capillary: 109 mg/dL — ABNORMAL HIGH (ref 70–99)
Glucose-Capillary: 241 mg/dL — ABNORMAL HIGH (ref 70–99)
Glucose-Capillary: 194 mg/dL — ABNORMAL HIGH (ref 70–99)
Glucose-Capillary: 235 mg/dL — ABNORMAL HIGH (ref 70–99)

## 2023-03-23 MED ORDER — ASPIRIN 81 MG PO CHEW
81.0000 mg | CHEWABLE_TABLET | Freq: Every day | ORAL | Status: DC
  Administered 2023-03-24: 81 mg via ORAL
  Filled 2023-03-23: qty 1

## 2023-03-23 MED ORDER — INSULIN GLARGINE-YFGN 100 UNIT/ML ~~LOC~~ SOLN
28.0000 [IU] | Freq: Every day | SUBCUTANEOUS | Status: DC
  Administered 2023-03-24: 28 [IU] via SUBCUTANEOUS
  Filled 2023-03-23: qty 0.28

## 2023-03-23 NOTE — Plan of Care (Signed)
  Problem: Nutrition: Goal: Adequate nutrition will be maintained Outcome: Progressing   Problem: Pain Management: Goal: General experience of comfort will improve Outcome: Progressing   Problem: Safety: Goal: Ability to remain free from injury will improve Outcome: Progressing   Problem: Skin Integrity: Goal: Risk for impaired skin integrity will decrease Outcome: Progressing

## 2023-03-23 NOTE — Progress Notes (Signed)
 PROGRESS NOTE    Rita Bass  FMW:[id number]  DOB: 1965/10/23  DOA: 03/18/2023 PCP: [name], MD Outpatient Specialists:   Hospital course:  58 year old female with severe morbid obesity, DM 2 complicated by neuropathy, chronic abdominal pain s/p ventral hernia repair was admitted for RSV infection presenting as worsening shortness of breath and wheezing.   Subjective:  Patient feels that she continues to improve.  However admits that she has been having substernal chest pressure without radiation and associated shortness of breath every time she walks even on flat ground for the past month.  Patient was being ambulated by nurse she had persistent recurrent chest discomfort with shortness of breath which resolved with rest.  Patient notes she has been having the same symptoms for a month.  Objective: Vitals:   03/23/23 1101 03/23/23 1102 03/23/23 1111 03/23/23 1220  BP:    122/78  Pulse: (!) 144 (!) 142 (!) 104 97  Resp:    16  Temp:    97.9 F (36.6 C)  TempSrc:    Oral  SpO2: 93% 96% 94% 93%  Weight:      Height:        Intake/Output Summary (Last 24 hours) at 03/23/2023 1541 Last data filed at 03/23/2023 1049 Gross per 24 hour  Intake 240 ml  Output --  Net 240 ml   Filed Weights   03/19/23 0726  Weight: (!) 147.9 kg     Exam:  General: Obese patient with flat affect sitting up in bed in no acute respiratory distress Eyes: sclera anicteric, conjuctiva mild injection bilaterally CVS: S1-S2, regular  Respiratory:  decreased air entry bilaterally, no wheezes, does have some coarse fibrotic sounding crackles GI: NABS, soft, NT  LE: Warm and well-perfused Neuro: A/O x 3,  grossly nonfocal.   Data Reviewed:  Basic Metabolic Panel: Recent Labs  Lab 03/18/23 2230 03/19/23 0201 03/20/23 0835 03/21/23 0817  NA 134* 134* 134* 136  K 3.1* 3.6 3.3* 3.5  CL 97* 101 101 104  CO2 21* 21* 24 24  GLUCOSE 240* 266* 165* 127*  BUN 5* 8 17 14    CREATININE 0.48 0.55  0.34* 0.54 0.39*  CALCIUM  8.4* 9.0 8.8* 8.7*  MG  --  2.0  --   --   PHOS  --  2.5  --   --     CBC: Recent Labs  Lab 03/18/23 2230 03/19/23 0201 03/20/23 0835  WBC 7.2 7.4 10.0  NEUTROABS 4.1  --   --   HGB 13.4 13.3 12.5  HCT 42.1 40.5 40.8  MCV 76.8* 76.3* 77.6*  PLT 285 254 283     Scheduled Meds:  amLODipine   2.5 mg Oral Daily   DULoxetine   20 mg Oral Daily   empagliflozin   25 mg Oral Daily   enoxaparin  (LOVENOX ) injection  70 mg Subcutaneous Q24H   feeding supplement  237 mL Oral BID BM   gabapentin   100 mg Oral BID   guaiFENesin   1,200 mg Oral BID   insulin  aspart  0-15 Units Subcutaneous TID WC   insulin  aspart  0-5 Units Subcutaneous QHS   insulin  aspart  4 Units Subcutaneous TID WC   insulin  glargine-yfgn  25 Units Subcutaneous Daily   insulin  starter kit- pen needles  1 kit Other Once   ipratropium-albuterol   3 mL Nebulization BID   loratadine   10 mg Oral Daily   mometasone -formoterol   2 puff Inhalation BID   predniSONE   40 mg Oral Q breakfast  Continuous Infusions:   Assessment & Plan:   SSCP Patient with HTN, DM 2 and obesity with 1 month of substernal chest pressure with ambulation that resolves with rest I have contacted cardiology, they will see her in the morning and evaluate for stress test Echocardiogram ordered Will start aspirin  81 mg daily  RSV infection Acute bronchitis Patient is improving daily however her SSCP is preventing her from walking as much as she could Continue prednisone  inhaled bronchodilators Finish course of azithromycin  Patient may need to be discharged home on home O2  HTN BP well-controlled with initiation of amlodipine  2.5  DM 2 complicated by neuropathy Hyperglycemia likely worsened in setting of steroids Will increase glargine to 28 units Continue preprandial aspart coverage and SSI Continue gabapentin   Hypokalemia Resolved with repletion  Hyponatremia Resolved with  treatment      DVT prophylaxis: Lovenox  Code Status: Full code Family Communication: Family was at bedside     Studies: No results found.  Principal Problem:   Dyspnea Active Problems:   Morbid obesity (HCC)   Sleep apnea   Type 2 diabetes mellitus with complication (HCC)   Diabetic nephropathy associated with type 2 diabetes mellitus (HCC)   Essential hypertension     Rita Bass, [facility]  If 7PM-7AM, please contact night-coverage [email or web address]   LOS: 4 days

## 2023-03-24 ENCOUNTER — Inpatient Hospital Stay (HOSPITAL_COMMUNITY): Payer: Medicaid Other

## 2023-03-24 DIAGNOSIS — R079 Chest pain, unspecified: Secondary | ICD-10-CM

## 2023-03-24 DIAGNOSIS — J121 Respiratory syncytial virus pneumonia: Secondary | ICD-10-CM

## 2023-03-24 DIAGNOSIS — R0609 Other forms of dyspnea: Secondary | ICD-10-CM | POA: Diagnosis not present

## 2023-03-24 LAB — BASIC METABOLIC PANEL
Anion gap: 10 (ref 5–15)
BUN: 19 mg/dL (ref 6–20)
CO2: 26 mmol/L (ref 22–32)
Calcium: 9.3 mg/dL (ref 8.9–10.3)
Chloride: 102 mmol/L (ref 98–111)
Creatinine, Ser: 0.57 mg/dL (ref 0.44–1.00)
GFR, Estimated: >60 mL/min (ref >60)
Glucose, Bld: 112 mg/dL — ABNORMAL HIGH (ref 70–99)
Potassium: 3.7 mmol/L (ref 3.5–5.1)
Sodium: 138 mmol/L (ref 135–145)

## 2023-03-24 LAB — ECHOCARDIOGRAM COMPLETE
AR max vel: 2.44 cm2
AV Area VTI: 3.24 cm2
AV Area mean vel: 2.34 cm2
AV Mean grad: 3 mm[Hg]
AV Peak grad: 6.1 mm[Hg]
Ao pk vel: 1.23 m/s
Area-P 1/2: 3.08 cm2
Calc EF: 54 %
Height: 64 in
MV VTI: 3.95 cm2
S' Lateral: 2.7 cm
Single Plane A2C EF: 53.2 %
Single Plane A4C EF: 52.5 %
Weight: 5216 [oz_av]

## 2023-03-24 LAB — GLUCOSE, CAPILLARY
Glucose-Capillary: 106 mg/dL — ABNORMAL HIGH (ref 70–99)
Glucose-Capillary: 274 mg/dL — ABNORMAL HIGH (ref 70–99)
Glucose-Capillary: 238 mg/dL — ABNORMAL HIGH (ref 70–99)

## 2023-03-24 MED ORDER — IPRATROPIUM-ALBUTEROL 0.5-2.5 (3) MG/3ML IN SOLN: 3.0000 mL | Freq: Four times a day (QID) | RESPIRATORY_TRACT | 0 refills | Status: DC | PRN

## 2023-03-24 MED ORDER — PERFLUTREN LIPID MICROSPHERE
1.0000 mL | INTRAVENOUS | Status: AC | PRN
  Administered 2023-03-24: 3 mL via INTRAVENOUS

## 2023-03-24 MED ORDER — ASPIRIN 81 MG PO CHEW: 81.0000 mg | CHEWABLE_TABLET | Freq: Every day | ORAL | 0 refills | Status: DC

## 2023-03-24 MED ORDER — BENZONATATE 100 MG PO CAPS
100.0000 mg | ORAL_CAPSULE | Freq: Four times a day (QID) | ORAL | 0 refills | Status: DC | PRN
Start: 2023-03-24 — End: 2023-05-02

## 2023-03-24 MED ORDER — AMLODIPINE BESYLATE 2.5 MG PO TABS: 2.5000 mg | ORAL_TABLET | Freq: Every day | ORAL | 0 refills | Status: DC

## 2023-03-24 NOTE — Discharge Summary (Signed)
 Marguerita Stapp Overbaugh FMW:[id number] DOB: July 17, 1965 DOA: 03/18/2023  PCP: [name], MD  Admit date: 03/18/2023  Discharge date: 03/24/2023  Admitted From: Home   disposition: Home  RSV better with inhaled bronchodilators had chest pain changed to amlodipine  and referred to outpatient stress test, echo normal, seen by cardiology here.  Recommendations for Outpatient Follow-up:   Follow up with PCP in 1-2 weeks Discuss substernal chest pressure and need for possible cardiology referral and stress test   Home Health: N/A Equipment/Devices: N/A Consultations: Cardiology Discharge Condition: Improved CODE STATUS: Full Diet Order             Diet - low sodium heart healthy           Diet heart healthy/carb modified Fluid consistency: Thin  Diet effective now                    Chief Complaint  Patient presents with   Shortness of Breath          Brief history of present illness from the day of admission and additional interim summary     Jacklyne Jay Kempe is a 58 y.o. female with medical history significant for severe morbid obesity, type 2 diabetes, diabetic polyneuropathy, osteoarthritis of knees bilaterally, chronic abdominal pain, GERD, H. pylori infection, ventral hernia s/p repair who presents to the ED with complaints of shortness of breath and wheezing.  She initially presented to her primary care provider and was referred to the ED for further evaluation due to abnormal lung sounds on exam and no improvement with at home nebulizers with suspicion for pneumonia.   In the ED, tachypneic, tachycardic, and wheezing, RSV by PCR positive.  Chest x-ray nonacute.  The patient received multiple rounds of breathing treatments and IV Solu-Medrol  with persistent wheezing and exertional dyspnea.                                                                   Hospital Course   Patient improved from her respiratory status with IV Solu-Medrol  and inhaled bronchodilators.  Oxygen demand was decreased and patient was weaned off oxygen.  With ambulation however patient scribed substernal chest pressure with shortness of breath with exertion for 1 month.  Patient was seen by cardiology who recommended outpatient stress test if echocardiogram was normal.  Echocardiogram was done which was essentially normal although of poor quality given large body habitus.  Patient discharged home on aspirin , amlodipine  and with inhaled bronchodilators.  Patient instructed to discuss further referral to outpatient cardiology with her PCP.    Discharge diagnosis     Principal Problem:   Dyspnea Active Problems:   Morbid obesity (HCC)   Sleep apnea   Type 2 diabetes mellitus with complication (HCC)   Diabetic nephropathy associated with  type 2 diabetes mellitus (HCC)   Essential hypertension    Discharge instructions    Discharge Instructions     Diet - low sodium heart healthy   Complete by: As directed    Discharge instructions   Complete by: As directed    Use your nebulizer 3 times a day for the next for 5 days whether or not you think you need it just to keep your lungs breathing well.  We have changed your high blood pressure medication to amlodipine .  Take 1 pill a day and let your PCP know that you are on this new medication.  Make sure you talk to your doctor about the chest pressure you have been having and ask them whether you need a referral to a heart doctor.   Increase activity slowly   Complete by: As directed        Discharge Medications   Allergies as of 03/24/2023   No Known Allergies      Medication List     STOP taking these medications    BinaxNOW COVID-19 Ag Home Test Kit Generic drug: COVID-19 At Home Antigen Test   lisinopril  10 MG tablet Commonly known as:  ZESTRIL    meloxicam  15 MG tablet Commonly known as: MOBIC    rifabutin  150 MG capsule Commonly known as: MYCOBUTIN        TAKE these medications    Accu-Chek Guide test strip Generic drug: glucose blood 1 each by Other route daily. for testing   Accu-Chek Softclix Lancets lancets Use as instructed   acetaminophen  650 MG CR tablet Commonly known as: TYLENOL  Take 650-1,300 mg by mouth every 8 (eight) hours as needed for pain. Last dose (2) around 0930 am.   albuterol  (5 MG/ML) 0.5% nebulizer solution Commonly known as: PROVENTIL  Take 0.5 mLs (2.5 mg total) by nebulization every 6 (six) hours as needed for wheezing or shortness of breath.   amLODipine  2.5 MG tablet Commonly known as: NORVASC  Take 1 tablet (2.5 mg total) by mouth daily. Start taking on: March 25, 2023   aspirin  81 MG chewable tablet Chew 1 tablet (81 mg total) by mouth daily. Start taking on: March 25, 2023   cetirizine  10 MG tablet Commonly known as: ZYRTEC  Take 1 tablet (10 mg total) by mouth daily.   Dulera  100-5 MCG/ACT Aero Generic drug: mometasone -formoterol  Inhale 2 puffs into the lungs daily.   DULoxetine  20 MG capsule Commonly known as: CYMBALTA  Take 1 capsule (20 mg total) by mouth daily.   empagliflozin  25 MG Tabs tablet Commonly known as: JARDIANCE  Take 1 tablet (25 mg total) by mouth daily.   fluticasone  50 MCG/ACT nasal spray Commonly known as: FLONASE  Place 2 sprays into both nostrils daily.   gabapentin  100 MG capsule Commonly known as: NEURONTIN  TAKE 4 CAPSULES(400 MG) BY MOUTH THREE TIMES DAILY   ibuprofen  800 MG tablet Commonly known as: ADVIL  Take 1 tablet (800 mg total) by mouth 3 (three) times daily. For headache.   ipratropium-albuterol  0.5-2.5 (3) MG/3ML Soln Commonly known as: DUONEB Take 3 mLs by nebulization every 6 (six) hours as needed.   metFORMIN  500 MG 24 hr tablet Commonly known as: GLUCOPHAGE -XR Take 2 tablets (1,000 mg total) by mouth 2 (two) times  daily.   methocarbamol  500 MG tablet Commonly known as: ROBAXIN  Take 1 tablet (500 mg total) by mouth every 8 (eight) hours as needed.   nystatin  powder Commonly known as: MYCOSTATIN /NYSTOP  Apply 1 Application topically 2 (two) times daily. What changed:  when to  take this reasons to take this   olopatadine  0.1 % ophthalmic solution Commonly known as: PATANOL Place 1 drop into both eyes 2 (two) times daily.   ondansetron  4 MG tablet Commonly known as: ZOFRAN  Take 1 tablet (4 mg total) by mouth every 8 (eight) hours as needed for nausea or vomiting.   Ozempic  (2 MG/DOSE) 8 MG/3ML Sopn Generic drug: Semaglutide  (2 MG/DOSE) Inject 2 mg into the skin once a week.   pantoprazole  40 MG tablet Commonly known as: PROTONIX  Take 1 tablet (40 mg total) by mouth daily.   Pen Needles 32G X 5 MM Misc Use as directed with victoza    traMADol  50 MG tablet Commonly known as: ULTRAM  Take 1 tablet (50 mg total) by mouth every 12 (twelve) hours as needed. Not to be refilled before 30 days          Major procedures and Radiology Reports - PLEASE review detailed and final reports thoroughly  -       ECHOCARDIOGRAM COMPLETE Result Date: 03/24/2023    ECHOCARDIOGRAM REPORT   Patient Name:   LYNE KHURANA [facility] Date of Exam: 03/24/2023 Medical Rec #:  [id number]             Height:       64.0 in Accession #:    [id number]            Weight:       326.0 lb Date of Birth:  1965/11/02             BSA:          2.408 m Patient Age:    57 years              BP:           124/82 mmHg Patient Gender: F                     HR:           82 bpm. Exam Location:  Inpatient Procedure: 2D Echo, Cardiac Doppler, Color Doppler and Intracardiac            Opacification Agent Indications:    CP  History:        Patient has prior history of Echocardiogram examinations, most                 recent 09/18/2017. Signs/Symptoms:Dyspnea and Shortness of Breath;                 Risk Factors:Sleep Apnea, Diabetes,  Hypertension, Dyslipidemia                 and Non-Smoker.  Sonographer:    Juanita Shaw Referring Phys: [id number] Adda Stokes TUBLU Sayer Masini IMPRESSIONS  1. Images are very limited.  2. Left ventricular ejection fraction, by estimation, is 55 to 60%. The left ventricle has normal function. Left ventricular endocardial border not optimally defined to evaluate regional wall motion. Left ventricular diastolic parameters are consistent with Grade I diastolic dysfunction (impaired relaxation).  3. Definity  contrast does not demonstrate any obvious wall motion abnormalities.  4. Right ventricular systolic function was not well visualized. The right ventricular size is not well visualized. Tricuspid regurgitation signal is inadequate for assessing PA pressure.  5. The mitral valve was not well visualized. Trivial mitral valve regurgitation.  6. The aortic valve was not well visualized. Aortic valve regurgitation is not visualized. Aortic valve mean gradient measures 3.0 mmHg.  7. Unable to estimate CVP. Comparison(s):  Prior images unable to be directly viewed. FINDINGS  Left Ventricle: Left ventricular ejection fraction, by estimation, is 55 to 60%. The left ventricle has normal function. Left ventricular endocardial border not optimally defined to evaluate regional wall motion. The left ventricular internal cavity size was normal in size. Suboptimal image quality limits for assessment of left ventricular hypertrophy. Left ventricular diastolic parameters are consistent with Grade I diastolic dysfunction (impaired relaxation). Right Ventricle: The right ventricular size is not well visualized. Right vetricular wall thickness was not well visualized. Right ventricular systolic function was not well visualized. Tricuspid regurgitation signal is inadequate for assessing PA pressure. Left Atrium: Left atrial size was normal in size. Right Atrium: Right atrial size was normal in size. Pericardium: The pericardium was not well  visualized. Mitral Valve: The mitral valve was not well visualized. Trivial mitral valve regurgitation. MV peak gradient, 2.9 mmHg. The mean mitral valve gradient is 1.0 mmHg. Tricuspid Valve: The tricuspid valve is not well visualized. Tricuspid valve regurgitation is trivial. Aortic Valve: The aortic valve was not well visualized. Aortic valve regurgitation is not visualized. Aortic valve mean gradient measures 3.0 mmHg. Aortic valve peak gradient measures 6.1 mmHg. Aortic valve area, by VTI measures 3.24 cm. Pulmonic Valve: The pulmonic valve was not well visualized. Pulmonic valve regurgitation is not visualized. Aorta: The aortic root and ascending aorta are structurally normal, with no evidence of dilitation. Venous: Unable to estimate CVP. The inferior vena cava was not well visualized. IAS/Shunts: The interatrial septum was not well visualized.  LEFT VENTRICLE PLAX 2D LVIDd:         3.90 cm     Diastology LVIDs:         2.70 cm     LV e' medial:    7.72 cm/s LV PW:         0.90 cm     LV E/e' medial:  7.6 LV IVS:        0.80 cm     LV e' lateral:   8.92 cm/s LVOT diam:     1.90 cm     LV E/e' lateral: 6.6 LV SV:         68 LV SV Index:   28 LVOT Area:     2.84 cm  LV Volumes (MOD) LV vol d, MOD A2C: 93.9 ml LV vol d, MOD A4C: 85.7 ml LV vol s, MOD A2C: 43.9 ml LV vol s, MOD A4C: 40.7 ml LV SV MOD A2C:     50.0 ml LV SV MOD A4C:     85.7 ml LV SV MOD BP:      49.5 ml RIGHT VENTRICLE RV S prime:     12.10 cm/s TAPSE (M-mode): 2.0 cm LEFT ATRIUM           Index LA diam:      3.40 cm 1.41 cm/m LA Vol (A2C): 46.9 ml 19.48 ml/m LA Vol (A4C): 21.6 ml 8.97 ml/m  AORTIC VALVE                    PULMONIC VALVE AV Area (Vmax):    2.44 cm     PV Vmax:       1.25 m/s AV Area (Vmean):   2.34 cm     PV Peak grad:  6.3 mmHg AV Area (VTI):     3.24 cm AV Vmax:           123.00 cm/s AV Vmean:  87.300 cm/s AV VTI:            0.209 m AV Peak Grad:      6.1 mmHg AV Mean Grad:      3.0 mmHg LVOT Vmax:          106.00 cm/s LVOT Vmean:        71.900 cm/s LVOT VTI:          0.239 m LVOT/AV VTI ratio: 1.14  AORTA Ao Root diam: 3.00 cm Ao Asc diam:  3.00 cm MITRAL VALVE MV Area (PHT): 3.08 cm    SHUNTS MV Area VTI:   3.95 cm    Systemic VTI:  0.24 m MV Peak grad:  2.9 mmHg    Systemic Diam: 1.90 cm MV Mean grad:  1.0 mmHg MV Vmax:       0.84 m/s MV Vmean:      55.1 cm/s MV Decel Time: 246 msec MV E velocity: 58.70 cm/s MV A velocity: 72.80 cm/s MV E/A ratio:  0.81 Jayson Sierras MD Electronically signed by Jayson Sierras MD Signature Date/Time: 03/24/2023/2:36:11 PM    Final    DG Chest 2 View Result Date: 03/18/2023 CLINICAL DATA:  Cough with shortness of breath and diarrhea. EXAM: CHEST - 2 VIEW COMPARISON:  January 26, 2022 FINDINGS: The heart size and mediastinal contours are within normal limits. Low lung volumes are noted. Mild, chronic appearing increased interstitial lung markings are also seen. No pleural effusion or pneumothorax is identified. Multilevel degenerative changes are present throughout the thoracic spine. IMPRESSION: Low lung volumes with mild, chronic appearing increased interstitial lung markings. Electronically Signed   By: Suzen Dials M.D.   On: 03/18/2023 21:22    Micro Results    Recent Results (from the past 240 hours)  Resp panel by RT-PCR (RSV, Flu A&B, Covid) Anterior Nasal Swab     Status: Abnormal   Collection Time: 03/18/23 10:29 PM   Specimen: Anterior Nasal Swab  Result Value Ref Range Status   SARS Coronavirus 2 by RT PCR NEGATIVE NEGATIVE Final    Comment: (NOTE) SARS-CoV-2 target nucleic acids are NOT DETECTED.  The SARS-CoV-2 RNA is generally detectable in upper respiratory specimens during the acute phase of infection. The lowest concentration of SARS-CoV-2 viral copies this assay can detect is 138 copies/mL. A negative result does not preclude SARS-Cov-2 infection and should not be used as the sole basis for treatment or other patient management  decisions. A negative result may occur with  improper specimen collection/handling, submission of specimen other than nasopharyngeal swab, presence of viral mutation(s) within the areas targeted by this assay, and inadequate number of viral copies(<138 copies/mL). A negative result must be combined with clinical observations, patient history, and epidemiological information. The expected result is Negative.  Fact Sheet for Patients:  bloggercourse.com  Fact Sheet for Healthcare Providers:  seriousbroker.it  This test is no t yet approved or cleared by the [place]  FDA and  has been authorized for detection and/or diagnosis of SARS-CoV-2 by FDA under an Emergency Use Authorization (EUA). This EUA will remain  in effect (meaning this test can be used) for the duration of the COVID-19 declaration under Section 564(b)(1) of the Act, 21 U.S.C.section 360bbb-3(b)(1), unless the authorization is terminated  or revoked sooner.       Influenza A by PCR NEGATIVE NEGATIVE Final   Influenza B by PCR NEGATIVE NEGATIVE Final    Comment: (NOTE) The Xpert Xpress SARS-CoV-2/FLU/RSV plus assay is intended as an aid  in the diagnosis of influenza from Nasopharyngeal swab specimens and should not be used as a sole basis for treatment. Nasal washings and aspirates are unacceptable for Xpert Xpress SARS-CoV-2/FLU/RSV testing.  Fact Sheet for Patients: bloggercourse.com  Fact Sheet for Healthcare Providers: seriousbroker.it  This test is not yet approved or cleared by the [place]  FDA and has been authorized for detection and/or diagnosis of SARS-CoV-2 by FDA under an Emergency Use Authorization (EUA). This EUA will remain in effect (meaning this test can be used) for the duration of the COVID-19 declaration under Section 564(b)(1) of the Act, 21 U.S.C. section 360bbb-3(b)(1), unless the  authorization is terminated or revoked.     Resp Syncytial Virus by PCR POSITIVE (A) NEGATIVE Final    Comment: (NOTE) Fact Sheet for Patients: bloggercourse.com  Fact Sheet for Healthcare Providers: seriousbroker.it  This test is not yet approved or cleared by the [place]  FDA and has been authorized for detection and/or diagnosis of SARS-CoV-2 by FDA under an Emergency Use Authorization (EUA). This EUA will remain in effect (meaning this test can be used) for the duration of the COVID-19 declaration under Section 564(b)(1) of the Act, 21 U.S.C. section 360bbb-3(b)(1), unless the authorization is terminated or revoked.  Performed at [facility], [address], [place], [place] [address]     Today   Subjective    Jenesis Martin feels much improved since admission.  Feels ready to go home.  Denies chest pain, shortness of breath or abdominal pain.  Feels they can take care of themselves with the resources they have at home.  Objective   Blood pressure 124/82, pulse 90, temperature 97.9 F (36.6 C), temperature source Oral, resp. rate 14, height 5' 4 (1.626 m), weight (!) 147.9 kg, last menstrual period 04/05/2016, SpO2 94%.   Intake/Output Summary (Last 24 hours) at 03/24/2023 1626 Last data filed at 03/23/2023 1900 Gross per 24 hour  Intake 360 ml  Output --  Net 360 ml    Exam General: Patient appears well and in good spirits sitting up in bed in no acute distress.  Eyes: sclera anicteric, conjuctiva mild injection bilaterally CVS: S1-S2, regular  Respiratory:  decreased air entry bilaterally secondary to decreased inspiratory effort, rales at bases  GI: NABS, soft, NT  LE: No edema.  Neuro: A/O x 3, Moving all extremities equally with normal strength, CN 3-12 intact, grossly nonfocal.  Psych: patient is logical and coherent, judgement and insight appear normal, mood and affect appropriate  to situation.    Data Review   CBC w Diff:  Lab Results  Component Value Date   WBC 10.0 03/20/2023   HGB 12.5 03/20/2023   HGB 11.5 09/07/2021   HCT 40.8 03/20/2023   HCT 35.7 09/07/2021   PLT 283 03/20/2023   PLT 411 09/07/2021   LYMPHOPCT 25 03/18/2023   MONOPCT 13 03/18/2023   EOSPCT 5 03/18/2023   BASOPCT 1 03/18/2023    CMP:  Lab Results  Component Value Date   NA 138 03/24/2023   NA 138 07/09/2022   K 3.7 03/24/2023   CL 102 03/24/2023   CO2 26 03/24/2023   BUN 19 03/24/2023   BUN 12 07/09/2022   CREATININE 0.57 03/24/2023   PROT 8.2 (H) 03/18/2023   PROT 7.5 06/14/2022   ALBUMIN 4.1 03/18/2023   ALBUMIN 4.3 06/14/2022   BILITOT 0.6 03/18/2023   BILITOT 0.3 06/14/2022   ALKPHOS 79 03/18/2023   AST 22 03/18/2023   ALT 25 03/18/2023  .  Total Time in preparing paper work, data evaluation and todays exam - 35 minutes  Deral Schellenberg Tublu Callum Wolf M.D on 03/24/2023 at 4:26 PM  [facility]

## 2023-03-24 NOTE — Plan of Care (Signed)
  Problem: Nutritional: Goal: Progress toward achieving an optimal weight will improve Outcome: Progressing   Problem: Tissue Perfusion: Goal: Adequacy of tissue perfusion will improve Outcome: Progressing   Problem: Clinical Measurements: Goal: Diagnostic test results will improve Outcome: Progressing Goal: Respiratory complications will improve Outcome: Progressing Goal: Cardiovascular complication will be avoided Outcome: Progressing   Problem: Education: Goal: Ability to describe self-care measures that may prevent or decrease complications (Diabetes Survival Skills Education) will improve Outcome: Adequate for Discharge Goal: Individualized Educational Video(s) Outcome: Adequate for Discharge   Problem: Coping: Goal: Ability to adjust to condition or change in health will improve Outcome: Adequate for Discharge   Problem: Fluid Volume: Goal: Ability to maintain a balanced intake and output will improve Outcome: Adequate for Discharge   Problem: Health Behavior/Discharge Planning: Goal: Ability to identify and utilize available resources and services will improve Outcome: Adequate for Discharge   Problem: Metabolic: Goal: Ability to maintain appropriate glucose levels will improve Outcome: Adequate for Discharge   Problem: Nutritional: Goal: Maintenance of adequate nutrition will improve Outcome: Adequate for Discharge   Problem: Skin Integrity: Goal: Risk for impaired skin integrity will decrease Outcome: Adequate for Discharge   Problem: Education: Goal: Knowledge of General Education information will improve Description: Including pain rating scale, medication(s)/side effects and non-pharmacologic comfort measures Outcome: Adequate for Discharge   Problem: Activity: Goal: Risk for activity intolerance will decrease Outcome: Adequate for Discharge   Problem: Nutrition: Goal: Adequate nutrition will be maintained Outcome: Adequate for Discharge    Problem: Coping: Goal: Level of anxiety will decrease Outcome: Adequate for Discharge   Problem: Elimination: Goal: Will not experience complications related to bowel motility Outcome: Adequate for Discharge Goal: Will not experience complications related to urinary retention Outcome: Adequate for Discharge   Problem: Safety: Goal: Ability to remain free from injury will improve Outcome: Adequate for Discharge   Problem: Skin Integrity: Goal: Risk for impaired skin integrity will decrease Outcome: Adequate for Discharge

## 2023-03-24 NOTE — TOC Transition Note (Signed)
 Transition of Care [facility]) - Discharge Note   Patient Details  Name: Rita Bass MRN: [id number] Date of Birth: October 19, 1965  [facility] of [facility]) CM/SW Contact:  Mitzie LOISE Pinal, LCSW Phone Number: 03/24/2023, 5:11 PM   Clinical Narrative:     Patient will be going home with self care.TOC signing off please reconsult with any other TOC needs.  Final next level of care: Home/Self Care     Patient Goals and CMS Choice Patient states their goals for this hospitalization and ongoing recovery are:: Patient to return back home. CMS Medicare.gov Compare Post Acute Care list provided to:: Patient        Discharge Placement                       Discharge Plan and Services Additional resources added to the After Visit Summary for                                       Social Drivers of Health (SDOH) Interventions SDOH Screenings   Food Insecurity: No Food Insecurity (03/19/2023)  Housing: Low Risk  (03/19/2023)  Transportation Needs: No Transportation Needs (03/19/2023)  Utilities: Not At Risk (03/19/2023)  Depression (PHQ2-9): Low Risk  (03/18/2023)  Recent Concern: Depression (PHQ2-9) - Medium Risk (03/04/2023)  Tobacco Use: Low Risk  (03/18/2023)     Readmission Risk Interventions     No data to display

## 2023-03-24 NOTE — Consult Note (Signed)
 Cardiology Consultation:   Patient ID: Rita Bass; [id number]; November 24, 1965   Admit date: 03/18/2023 Date of Consult: 03/24/2023  Primary Care Provider: [name], MD Primary Cardiologist: Alm Clay, MD  History of Present Illness:   Rita Bass is a 58 y.o. female with past medical history outlined below, currently admitted to the hospital with acute bronchitis associated with RSV.  Reports cough and wheezing, has been treated with antibiotics in addition to bronchodilators and prednisone  per primary team.  She has had ambulatory hypoxemia and sinus tachycardia under observation.  Cardiology consulted due to reported chest discomfort.  I reviewed the chart.  Patient has a longstanding history of dyspnea on exertion that is felt to be multifactorial.  She underwent a CPX back in 2019 indicating no specific cardiac limitation with exercise intolerance related to severe obesity and restrictive ventilatory physiology.  Echocardiogram at that time revealed LVEF 55 to 60% with moderate LVH and mild diastolic dysfunction.  She does report dyspnea on exertion with intermittent chest tightness for several months, not describing an accelerating pattern, but does feel worse now in the setting of RSV.  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  Presenting high-sensitivity troponin I level was normal.  ROS:  Pertinent review in history of present illness.  No palpitations or unexplained syncope.  Past Medical History:  Diagnosis Date   Arthritis    Asthma    Chronic pain of left knee    Complication of anesthesia    PONV   Diabetes (HCC)    GERD (gastroesophageal reflux disease)    H/O bronchitis    Hepatic steatosis    Internal hemorrhoids    Kidney stones    Obesity    Renal disorder    Shortness of breath dyspnea    Sleep apnea    past issues - at weight over 400lbs   Sleep apnea in adult    Polysomnogram pending.  Followed by Dr. Shellia     Past Surgical History:  Procedure Laterality Date   BIOPSY  11/01/2020   Procedure: BIOPSY;  Surgeon: San Sandor GAILS, DO;  Location: [facility];  Service: Gastroenterology;;   BIOPSY  05/11/2021   Procedure: BIOPSY;  Surgeon: San Sandor GAILS, DO;  Location: [facility];  Service: Gastroenterology;;   BIOPSY  11/29/2022   Procedure: BIOPSY;  Surgeon: San Sandor GAILS, DO;  Location: [facility];  Service: Gastroenterology;;   CESAREAN SECTION     6 c-sections   CHOLECYSTECTOMY     COLONOSCOPY WITH PROPOFOL  N/A 05/11/2021   Procedure: COLONOSCOPY WITH PROPOFOL ;  Surgeon: San Sandor GAILS, DO;  Location: [facility];  Service: Gastroenterology;  Laterality: N/A;   ESOPHAGOGASTRODUODENOSCOPY (EGD) WITH PROPOFOL  N/A 11/01/2020   Procedure: ESOPHAGOGASTRODUODENOSCOPY (EGD) WITH PROPOFOL ;  Surgeon: San Sandor GAILS, DO;  Location: [facility];  Service: Gastroenterology;  Laterality: N/A;   ESOPHAGOGASTRODUODENOSCOPY (EGD) WITH PROPOFOL  N/A 11/29/2022   Procedure: ESOPHAGOGASTRODUODENOSCOPY (EGD) WITH PROPOFOL ;  Surgeon: San Sandor GAILS, DO;  Location: [facility];  Service: Gastroenterology;  Laterality: N/A;   HERNIA REPAIR     KIDNEY STONE SURGERY     OOPHORECTOMY     TRANSTHORACIC ECHOCARDIOGRAM  09/2017   Technically difficult study.  Did not use Definity  contrast.  EF was 55 to 60% with moderate LVH and grade 1 diastolic dysfunction.  No significant valvular lesions noted.  No regional wall motion normality but difficult to assess due to poor imaging.    TUBAL LIGATION     UPPER GASTROINTESTINAL  ENDOSCOPY       Inpatient Medications: Scheduled Meds:  amLODipine   2.5 mg Oral Daily   aspirin   81 mg Oral Daily   DULoxetine   20 mg Oral Daily   empagliflozin   25 mg Oral Daily   enoxaparin  (LOVENOX ) injection  70 mg Subcutaneous Q24H   feeding supplement  237 mL Oral BID BM   gabapentin   100 mg Oral BID   guaiFENesin   1,200 mg Oral BID   insulin  aspart  0-15  Units Subcutaneous TID WC   insulin  aspart  0-5 Units Subcutaneous QHS   insulin  aspart  4 Units Subcutaneous TID WC   insulin  glargine-yfgn  28 Units Subcutaneous Daily   insulin  starter kit- pen needles  1 kit Other Once   ipratropium-albuterol   3 mL Nebulization BID   loratadine   10 mg Oral Daily   mometasone -formoterol   2 puff Inhalation BID   predniSONE   40 mg Oral Q breakfast    PRN Meds: acetaminophen , albuterol , benzonatate , chlorpheniramine-HYDROcodone , labetalol , melatonin, polyethylene glycol, prochlorperazine , traMADol   Allergies:   No Known Allergies  Social History:   Social History   Tobacco Use   Smoking status: Never   Smokeless tobacco: Never  Substance Use Topics   Alcohol use: No    Family History:   The patient's family history includes Breast cancer in her mother; Diabetes in her father; Heart attack in her maternal aunt; Other in her father. There is no history of Colon cancer, Esophageal cancer, Pancreatic cancer, Stomach cancer, Liver disease, Colon polyps, or Rectal cancer.  Physical Exam/Data:   Vitals:   03/23/23 1220 03/23/23 1955 03/23/23 1957 03/23/23 1958  BP: 122/78 (!) 162/93    Pulse: 97 98    Resp: 16     Temp: 97.9 F (36.6 C) 98.1 F (36.7 C)    TempSrc: Oral Oral    SpO2: 93% 94% 95% 95%  Weight:      Height:        Intake/Output Summary (Last 24 hours) at 03/24/2023 0827 Last data filed at 03/23/2023 1900 Gross per 24 hour  Intake 960 ml  Output --  Net 960 ml   Filed Weights   03/19/23 0726  Weight: (!) 147.9 kg   Body mass index is 55.96 kg/m.   Gen: Patient appears comfortable at rest. HEENT: Conjunctiva and lids normal, oropharynx clear. Neck: Supple, increased girth, difficult to assess JVP. Lungs: Prolonged expiratory phase with wheeze, scattered rhonchi. Cardiac: Regular rate and rhythm, no S3 or significant systolic murmur, no pericardial rub. Abdomen: Obese, nontender, bowel sounds present. Extremities: No  pitting edema, distal pulses 2+. Skin: Warm and dry. Musculoskeletal: No kyphosis. Neuropsychiatric: Alert and oriented x3, affect grossly appropriate.  EKG:  An ECG dated 03/19/2023 was personally reviewed today and demonstrated:  Sinus tachycardia with left anterior fascicular block.  Telemetry:  I personally reviewed telemetry which shows sinus rhythm and sinus tachycardia.  Relevant CV Studies:  Echocardiogram 09/18/2017: - Procedure narrative: Transthoracic echocardiography. Image    quality was adequate. The study was technically difficult, as a    result of body habitus.  - Left ventricle: The cavity size was normal. Wall thickness was    increased in a pattern of moderate LVH. Systolic function was    normal. The estimated ejection fraction was in the range of 55%    to 60%. Wall motion was normal; there were no regional wall    motion abnormalities. Doppler parameters are consistent with    abnormal left ventricular  relaxation (grade 1 diastolic    dysfunction). The E/e&' ratio is <8, suggesting normal LV filling    pressure.  - Mitral valve: Mildly thickened leaflets . There was trivial    regurgitation.   Impressions:   - Technically difficult study with poor echo windows. Definity     contrast was not utilized. Grossly normal LVEF of 55-60%,    moderate LVH and grade 1 DD with normal LV filling pressure.   CPX 11/27/2017: Conclusion: Exercise testing with gas exchange demonstrates normal functional capacity when compared to matched sedentary norms. However, PVO2 is much less than expected for patient's age and is due to her severe obesity (additional support with corrections to ideal body weight in normal healthy/range). Pre-exercise spirometry demonstrates similar restrictive patterns that are also revealed with high ventilation during exercise.   Agree with above. No evidence of cardiac limitation. Patient's exercise intolerance is related to her severe obesity and  related restrictive ventilatory physiology. When her measure pVO2 is corrected to her ideal body weight it actually reflects excellent cardiac capacoty suggesting she has used her body habitus as a training stimulus.   Laboratory Data:  Chemistry Recent Labs  Lab 03/20/23 0835 03/21/23 0817 03/24/23 0448  NA 134* 136 138  K 3.3* 3.5 3.7  CL 101 104 102  CO2 24 24 26   GLUCOSE 165* 127* 112*  BUN 17 14 19   CREATININE 0.54 0.39* 0.57  CALCIUM  8.8* 8.7* 9.3  GFRNONAA >60 >60 >60  ANIONGAP 9 8 10     Recent Labs  Lab 03/18/23 2230  PROT 8.2*  ALBUMIN 4.1  AST 22  ALT 25  ALKPHOS 79  BILITOT 0.6   Hematology Recent Labs  Lab 03/18/23 2230 03/19/23 0201 03/20/23 0835  WBC 7.2 7.4 10.0  RBC 5.48* 5.31* 5.26*  HGB 13.4 13.3 12.5  HCT 42.1 40.5 40.8  MCV 76.8* 76.3* 77.6*  MCH 24.5* 25.0* 23.8*  MCHC 31.8 32.8 30.6  RDW 17.4* 17.3* 17.6*  PLT 285 254 283   Cardiac Enzymes Recent Labs  Lab 03/19/23 0051  TROPONINIHS 5   BNP Recent Labs  Lab 03/18/23 2230  BNP 21.6    Lipid Panel     Component Value Date/Time   CHOL 192 06/21/2020 1547   TRIG 131 06/21/2020 1547   HDL 54 06/21/2020 1547   CHOLHDL 3.6 06/21/2020 1547   LDLCALC 115 (H) 06/21/2020 1547   LDLDIRECT 117 (H) 02/28/2021 0917   LABVLDL 23 06/21/2020 1547    Radiology/Studies:  Chest x-ray 03/18/2023: FINDINGS: The heart size and mediastinal contours are within normal limits. Low lung volumes are noted. Mild, chronic appearing increased interstitial lung markings are also seen. No pleural effusion or pneumothorax is identified. Multilevel degenerative changes are present throughout the thoracic spine.   IMPRESSION: Low lung volumes with mild, chronic appearing increased interstitial lung markings.  Assessment and Plan:   1.  Longstanding dyspnea on exertion with intermittent exertional chest tightness, worse now in the setting of active RSV bronchitis with ambulatory hypoxemia and sinus  tachycardia.  Presenting high-sensitivity troponin I levels normal.  ECG without acute ST segment change.  Prior cardiac testing from 2019 is discussed above.  2.  Morbid obesity, currently 326 pounds (BMI 55.9).  3.  Type 2 diabetes mellitus.  4.  Sleep apnea.  5.  Primary hypertension.  Agree with obtaining echocardiogram.  Reasonable to consider further ischemic testing, although could potentially be done as an outpatient if LVEF is normal without wall motion abnormalities (if  she has evidence of cardiomyopathy however, a cardiac catheterization would be indicated).  Ideally would wait until she is clinically stable from RSV infection as well.  Body habitus would limit diagnostic use of coronary CTA and also Myoview.  Could potentially be a candidate for an outpatient PET myocardial perfusion imaging study.  Will follow-up on echocardiogram results.  For questions or updates, please contact [facility] Please consult [email or web address] for contact info under   Signed, Jayson Sierras, MD  03/24/2023 8:27 AM

## 2023-03-24 NOTE — Plan of Care (Signed)
  Problem: Coping: Goal: Level of anxiety will decrease Outcome: Progressing   Problem: Pain Management: Goal: General experience of comfort will improve Outcome: Progressing   Problem: Safety: Goal: Ability to remain free from injury will improve Outcome: Progressing   Problem: Skin Integrity: Goal: Risk for impaired skin integrity will decrease Outcome: Progressing

## 2023-03-24 NOTE — Plan of Care (Signed)
 Problem: Education: Goal: Ability to describe self-care measures that may prevent or decrease complications (Diabetes Survival Skills Education) will improve Outcome: Adequate for Discharge Goal: Individualized Educational Video(s) Outcome: Adequate for Discharge   Problem: Coping: Goal: Ability to adjust to condition or change in health will improve Outcome: Adequate for Discharge   Problem: Fluid Volume: Goal: Ability to maintain a balanced intake and output will improve Outcome: Adequate for Discharge   Problem: Health Behavior/Discharge Planning: Goal: Ability to identify and utilize available resources and services will improve Outcome: Adequate for Discharge Goal: Ability to manage health-related needs will improve Outcome: Adequate for Discharge   Problem: Metabolic: Goal: Ability to maintain appropriate glucose levels will improve Outcome: Adequate for Discharge   Problem: Nutritional: Goal: Maintenance of adequate nutrition will improve Outcome: Adequate for Discharge Goal: Progress toward achieving an optimal weight will improve Outcome: Adequate for Discharge   Problem: Skin Integrity: Goal: Risk for impaired skin integrity will decrease Outcome: Adequate for Discharge   Problem: Tissue Perfusion: Goal: Adequacy of tissue perfusion will improve Outcome: Adequate for Discharge   Problem: Education: Goal: Knowledge of General Education information will improve Description: Including pain rating scale, medication(s)/side effects and non-pharmacologic comfort measures Outcome: Adequate for Discharge   Problem: Health Behavior/Discharge Planning: Goal: Ability to manage health-related needs will improve Outcome: Adequate for Discharge   Problem: Clinical Measurements: Goal: Ability to maintain clinical measurements within normal limits will improve Outcome: Adequate for Discharge Goal: Will remain free from infection Outcome: Adequate for Discharge Goal:  Diagnostic test results will improve Outcome: Adequate for Discharge Goal: Respiratory complications will improve Outcome: Adequate for Discharge Goal: Cardiovascular complication will be avoided Outcome: Adequate for Discharge   Problem: Activity: Goal: Risk for activity intolerance will decrease Outcome: Adequate for Discharge   Problem: Nutrition: Goal: Adequate nutrition will be maintained Outcome: Adequate for Discharge   Problem: Coping: Goal: Level of anxiety will decrease Outcome: Adequate for Discharge   Problem: Elimination: Goal: Will not experience complications related to bowel motility Outcome: Adequate for Discharge Goal: Will not experience complications related to urinary retention Outcome: Adequate for Discharge   Problem: Pain Management: Goal: General experience of comfort will improve Outcome: Adequate for Discharge   Problem: Safety: Goal: Ability to remain free from injury will improve Outcome: Adequate for Discharge   Problem: Skin Integrity: Goal: Risk for impaired skin integrity will decrease Outcome: Adequate for Discharge   Problem: Education: Goal: Ability to describe self-care measures that may prevent or decrease complications (Diabetes Survival Skills Education) will improve Outcome: Adequate for Discharge Goal: Individualized Educational Video(s) Outcome: Adequate for Discharge   Problem: Coping: Goal: Ability to adjust to condition or change in health will improve Outcome: Adequate for Discharge   Problem: Fluid Volume: Goal: Ability to maintain a balanced intake and output will improve Outcome: Adequate for Discharge   Problem: Health Behavior/Discharge Planning: Goal: Ability to identify and utilize available resources and services will improve Outcome: Adequate for Discharge Goal: Ability to manage health-related needs will improve Outcome: Adequate for Discharge   Problem: Metabolic: Goal: Ability to maintain appropriate  glucose levels will improve Outcome: Adequate for Discharge   Problem: Nutritional: Goal: Maintenance of adequate nutrition will improve Outcome: Adequate for Discharge Goal: Progress toward achieving an optimal weight will improve Outcome: Adequate for Discharge   Problem: Skin Integrity: Goal: Risk for impaired skin integrity will decrease Outcome: Adequate for Discharge   Problem: Tissue Perfusion: Goal: Adequacy of tissue perfusion will improve Outcome: Adequate for Discharge  Problem: Education: Goal: Knowledge of General Education information will improve Description: Including pain rating scale, medication(s)/side effects and non-pharmacologic comfort measures Outcome: Adequate for Discharge   Problem: Health Behavior/Discharge Planning: Goal: Ability to manage health-related needs will improve Outcome: Adequate for Discharge   Problem: Clinical Measurements: Goal: Ability to maintain clinical measurements within normal limits will improve Outcome: Adequate for Discharge Goal: Will remain free from infection Outcome: Adequate for Discharge Goal: Diagnostic test results will improve Outcome: Adequate for Discharge Goal: Respiratory complications will improve Outcome: Adequate for Discharge Goal: Cardiovascular complication will be avoided Outcome: Adequate for Discharge   Problem: Activity: Goal: Risk for activity intolerance will decrease Outcome: Adequate for Discharge   Problem: Nutrition: Goal: Adequate nutrition will be maintained Outcome: Adequate for Discharge   Problem: Coping: Goal: Level of anxiety will decrease Outcome: Adequate for Discharge   Problem: Elimination: Goal: Will not experience complications related to bowel motility Outcome: Adequate for Discharge Goal: Will not experience complications related to urinary retention Outcome: Adequate for Discharge   Problem: Pain Management: Goal: General experience of comfort will  improve Outcome: Adequate for Discharge   Problem: Safety: Goal: Ability to remain free from injury will improve Outcome: Adequate for Discharge   Problem: Skin Integrity: Goal: Risk for impaired skin integrity will decrease Outcome: Adequate for Discharge

## 2023-03-26 ENCOUNTER — Encounter: Payer: Self-pay | Admitting: Family Medicine

## 2023-03-26 ENCOUNTER — Other Ambulatory Visit: Payer: Self-pay | Admitting: Family Medicine

## 2023-03-26 ENCOUNTER — Ambulatory Visit: Payer: Medicaid Other | Admitting: Family Medicine

## 2023-03-26 VITALS — BP 126/94 | HR 96 | Wt 320.0 lb

## 2023-03-26 DIAGNOSIS — E118 Type 2 diabetes mellitus with unspecified complications: Secondary | ICD-10-CM | POA: Diagnosis not present

## 2023-03-26 DIAGNOSIS — R051 Acute cough: Secondary | ICD-10-CM | POA: Diagnosis not present

## 2023-03-26 DIAGNOSIS — R1084 Generalized abdominal pain: Secondary | ICD-10-CM

## 2023-03-26 DIAGNOSIS — R1033 Periumbilical pain: Secondary | ICD-10-CM | POA: Diagnosis not present

## 2023-03-26 DIAGNOSIS — G4733 Obstructive sleep apnea (adult) (pediatric): Secondary | ICD-10-CM

## 2023-03-26 DIAGNOSIS — R079 Chest pain, unspecified: Secondary | ICD-10-CM

## 2023-03-26 LAB — GLUCOSE, POCT (MANUAL RESULT ENTRY): POC Glucose: 153 mg/dL — AB (ref 70–99)

## 2023-03-26 MED ORDER — BLOOD GLUCOSE TEST VI STRP: 1.0000 | ORAL_STRIP | Freq: Three times a day (TID) | 3 refills | Status: AC

## 2023-03-26 MED ORDER — LANCETS MISC. MISC: 1.0000 | Freq: Three times a day (TID) | 0 refills | Status: AC

## 2023-03-26 MED ORDER — BLOOD GLUCOSE MONITORING SUPPL DEVI: 1.0000 | Freq: Three times a day (TID) | 0 refills | Status: AC

## 2023-03-26 MED ORDER — LANCET DEVICE MISC: 1.0000 | Freq: Three times a day (TID) | 0 refills | Status: AC

## 2023-03-26 MED ORDER — HYDROCODONE BIT-HOMATROP MBR 5-1.5 MG/5ML PO SOLN: 5.0000 mL | Freq: Every day | ORAL | 0 refills | Status: DC | PRN

## 2023-03-26 MED ORDER — GUAIFENESIN 100 MG/5ML PO LIQD: 5.0000 mL | Freq: Four times a day (QID) | ORAL | 0 refills | Status: DC | PRN

## 2023-03-26 MED ORDER — INSULIN GLARGINE 100 UNIT/ML SOLOSTAR PEN: 10.0000 [IU] | PEN_INJECTOR | Freq: Every day | SUBCUTANEOUS | 3 refills | Status: DC

## 2023-03-26 NOTE — Patient Instructions (Addendum)
 Good to see you today - Thank you for coming in  Things we discussed today:  1) I am ending a referral for you to see a cardiologist.  Expect a phone call from them in the next few days to schedule that.  2) for your diabetes, I am sending a prescription for you to get a glucometer -Continue taking your other medications including metformin , Jardiance , Ozempic  - Your sugar is improving today so I won't start insulin  at this time.  3) for your RSV and flu infection, your body is fighting off the infection, but you are still having a postviral cough.  This is your body clearing out debris from your lungs and throat after the infection. -Drink plenty of water  to help keep the mucus thin and moisturized.  This will make it easier for you to cough out your phlegm -You can continue taking Robutussin  -I am sending in a short course of codeine cough syrup.  You can take this at nighttime for the next 5 days, but avoid taking it too much as it can put you at risk of more infections.  4) Wear your CPAP consistently every night. This can help with your lung health and breathing.

## 2023-03-26 NOTE — Progress Notes (Signed)
    SUBJECTIVE:   CHIEF COMPLAINT / HPI:   DM is a 58yo F w/ hx of asthma, T2DM, H pylori infxn, chronic abm pain, BL knee OA, OSA that p/f hospital F/u.  - Admitted 12/30 for RSV infxn and respiratory destress, treated with nebulizers and steroids - Also reported 1 month hx of chest pressure, echo obtained was normal but limited due to body habitus. Cardiology consult reviewed prior cardiac testing from 2019, suspect that dyspnea is more related to obesity and restrictive lung physilogy.  - needs Cardiologist referral - Was also started on a daily ASA due chest pain. - Still has cough and wheezing. Reports that her cough is keeping her up at night, it causes a lot of pain in her back and sides due to how much she has been coughing. - Pt reports still feeling SOB and is interested in getting oxygen at home as well. - Pt requesting codeinesyrup for cough. Reports that tessalon  was not helpful and she is having a lot of pain from constant coughing. - Not wearing CPAP at home. Daughter reports that she does snore  T2DM - Was started on insulin  but was not ordered at time of discharge. Pt reports she was told that they would send the prescription but then also told her to follow-up with PCP about this - Needs a glucometer, her current one doesn't work anymore  OBJECTIVE:   BP (!) 126/94   Pulse 96   Wt (!) 320 lb (145.2 kg)   LMP 04/05/2016   SpO2 98% Comment: 96% ambulation  BMI 54.93 kg/m   General: Alert, pleasant non-toxic appearing woman. NAD. HEENT: NCAT. MMM. CV: RRR, no murmurs. Cap refill <2. Resp: Diffuse coarse transmitted upper airway sound in lower lung fields BL.  No wheezing. Normal WOB on RA, speaking in full sentences.  Abm: Soft, nontender, nondistended. BS present. Ext: Moves all ext spontaneously Skin: Warm, well perfused   ASSESSMENT/PLAN:   Assessment & Plan Type 2 diabetes mellitus with complication (HCC) Required insulin  while admitted, likely had  hyperglycemia related to active infxn and steroids.  Patient has been off insulin , blood glucose today in clinic was 153.  Will hold off on insulin  for now. -Continue metformin , Jardiance , Ozempic  -Glucometer ordered Chest pain, unspecified type VSS.  Satting well on ambulatory oxygen test. Cardiology referral placed.  Continue aspirin . Acute cough Vital signs stable, no wheezing on exam, stable ambulatory saturation.  Suspect that patient cough is most likely postviral.  No wheezing on exam, further steroids are not indicated at this time.  Patient is requesting codeine cough syrup given pain from her constant coughing, reports that Tessalon  and guaifenesin  are not controlling her symptoms. -Supportive management discussed.  Encouraged hydration. -Continue guaifenesin  5ml q6h prn for phlegm -Provided short course of Hycodan syrup.  Advised only to take if needed at nighttime for the next 5 days.  Counseled on the risk of taking too much.  Advised that suppression of cough could lead to increased risk of infections. OSA (obstructive sleep apnea) Has not been wearing her CPAP.  Per cardiology consult note while patient was admitted, suspect a component of lung restriction due to body habitus leading to her presentation at that time.  Encouraged patient to resume wearing her CPAP.     [name], MD [facility]

## 2023-03-27 MED ORDER — TRAMADOL HCL 50 MG PO TABS: 50.0000 mg | ORAL_TABLET | Freq: Two times a day (BID) | ORAL | 0 refills | Status: DC | PRN

## 2023-03-28 NOTE — Assessment & Plan Note (Signed)
 Required insulin  while admitted, likely had hyperglycemia related to active infxn and steroids.  Patient has been off insulin , blood glucose today in clinic was 153.  Will hold off on insulin  for now. -Continue metformin , Jardiance , Ozempic  -Glucometer ordered

## 2023-04-02 ENCOUNTER — Ambulatory Visit: Payer: Medicaid Other

## 2023-04-03 ENCOUNTER — Ambulatory Visit (INDEPENDENT_AMBULATORY_CARE_PROVIDER_SITE_OTHER): Payer: Medicaid Other | Admitting: Student

## 2023-04-03 ENCOUNTER — Ambulatory Visit
Admission: RE | Admit: 2023-04-03 | Discharge: 2023-04-03 | Disposition: A | Discharge status: Home / Self Care | Payer: Medicaid Other | Source: Ambulatory Visit | Attending: Family Medicine | Admitting: Family Medicine

## 2023-04-03 ENCOUNTER — Encounter: Payer: Self-pay | Admitting: Pharmacist

## 2023-04-03 ENCOUNTER — Ambulatory Visit (INDEPENDENT_AMBULATORY_CARE_PROVIDER_SITE_OTHER): Payer: Medicaid Other | Admitting: Pharmacist

## 2023-04-03 VITALS — BP 136/74 | HR 86 | Wt 325.0 lb

## 2023-04-03 DIAGNOSIS — R109 Unspecified abdominal pain: Secondary | ICD-10-CM

## 2023-04-03 DIAGNOSIS — E1140 Type 2 diabetes mellitus with diabetic neuropathy, unspecified: Secondary | ICD-10-CM | POA: Diagnosis not present

## 2023-04-03 DIAGNOSIS — R059 Cough, unspecified: Secondary | ICD-10-CM | POA: Diagnosis not present

## 2023-04-03 DIAGNOSIS — E118 Type 2 diabetes mellitus with unspecified complications: Secondary | ICD-10-CM | POA: Diagnosis not present

## 2023-04-03 LAB — POCT URINALYSIS DIP (MANUAL ENTRY)
Bilirubin, UA: NEGATIVE
Glucose, UA: 100 mg/dL — AB
Ketones, POC UA: NEGATIVE mg/dL
Leukocytes, UA: NEGATIVE
Nitrite, UA: NEGATIVE
Protein Ur, POC: 100 mg/dL — AB
Spec Grav, UA: >=1.03 — AB (ref 1.010–1.025)
Urobilinogen, UA: 0.2 U/dL
pH, UA: 5.5 (ref 5.0–8.0)

## 2023-04-03 MED ORDER — LIDOCAINE 4 % EX PTCH: 1.0000 | MEDICATED_PATCH | CUTANEOUS | 0 refills | Status: DC

## 2023-04-03 MED ORDER — INSULIN GLARGINE 100 UNIT/ML SOLOSTAR PEN: 10.0000 [IU] | PEN_INJECTOR | SUBCUTANEOUS | 11 refills | Status: DC

## 2023-04-03 NOTE — Progress Notes (Signed)
 See previous note and encounter

## 2023-04-03 NOTE — Patient Instructions (Addendum)
 It was great to see you today! Thank you for choosing [facility] for your primary care.  Today we addressed: Please get chest xray at [address] I will update you about urine results  Lidocaine  patches can be used on the area of pain  Follow up with Dr. Jari Merles, call if worsening  Medications: (Per Dr. Alice Innocent) Continue using metformin  500 mg 2 tablets twice daily and Ozempic  (semaglutide ) 2 mg once weekly.  Start using Lantus  (insulin  glargine) 10 units daily in the morning. Then increase 1 unit each day until blood sugar < 150 mg/dL. Call if you see blood sugar is below 80 mg/dL.  Continue using other medications.  If you haven't already, sign up for My Chart to have easy access to your labs results, and communication with your primary care physician. I recommend that you always bring your medications to each appointment as this makes it easy to ensure you are on the correct medications and helps us  not miss refills when you need them. Call the clinic at [phone] if your symptoms worsen or you have any concerns.  Please arrive 15 minutes before your appointment to ensure smooth check in process.  We appreciate your efforts in making this happen.  Thank you for allowing me to participate in your care, Ernestina Headland, MD 04/03/2023, 10:42 AM PGY-3, [facility]   Plan pharmacy appointment in the follow-up with Dr. Zheng

## 2023-04-03 NOTE — Progress Notes (Addendum)
    SUBJECTIVE:   CHIEF COMPLAINT / HPI:   Left Flank  Rib Pain:  Patient complains of left flank pain onset several days ago  No rash No chest pain   Patient reports that it is worse with deep breathing and coughing.  Patient denies fever and sorethroat.  Patient does not have a history of recurrent UTI.    Reports recent admission to hospital for RSV with wheezing, tachycardia, tachypnea at the end of December. Since then, has had this left sided lower rib pain radiating to the back. No urinary symptoms. No hematuria   Still has cough and intermittent shortness of breath.   PERTINENT  PMH / PSH: Diastolic dysfunction, HTN, Asthma, Lung nodule, Chronic diarrhea, DM2, OSA  OBJECTIVE:   BP 136/74 (BP Location: Left Arm, Patient Position: Sitting, Cuff Size: Large)   Pulse 86   Wt (!) 325 lb (147.4 kg)   LMP 04/05/2016   SpO2 98%   BMI 55.79 kg/m   General: Alert and oriented in no apparent distress Heart: Regular rate and rhythm with no murmurs appreciated Lungs: Coarse throughout, difficulty 2/2 body habitus  Abdomen: Bowel sounds present, no abdominal pain Skin: Warm and dry, no rashes, TTP over lower left ribs to flank    ASSESSMENT/PLAN:   Assessment & Plan Flank pain Unknown etiology: Concern for pulmonary source such as pneumonia or pleural effusion causing referred pain.  Will continue with chest x-ray for further evaluation.  Additionally, will obtain UA to rule out urinary source.  MSK is also a possibility, ordered lidocaine  patches for the patient and can take her muscle relaxant at home.  Continue with symptomatic management in the meantime.  Strict return precautions discussed. Reassured by oxygenation on RA. No rash, unlikely shingles, considered kidney stones as well--look for blood in UA dipstick. ED precautions given       Ernestina Headland, MD [facility]

## 2023-04-03 NOTE — Progress Notes (Signed)
 Reviewed and agree with Dr Macky Lower plan.

## 2023-04-03 NOTE — Assessment & Plan Note (Signed)
 Diabetes longstanding currently uncontrolled reporting home readings of 240-300. Patient is able to verbalize appropriate hypoglycemia management plan. Medication adherence appears good. Control is suboptimal due to RSV - continued respiratory symptoms and pain / Stress. -Started basal insulin  Lantus  (insulin  glargine) 10 units daily in the morning. Patient will continue to titrate 1 unit every day if fasting blood sugar > 150mg /dl until fasting blood sugars reach goal or next visit.  -Continued GLP-1 Ozempic  (semaglutide ) 2 mg once weekly -Continued SGLT2-I Jardiance  (empagliflozin ) 10 mg once daily.  -Continued metformin  500 mg 2 tablets BID.   -Counseled on s/sx of and management of hypoglycemia.

## 2023-04-03 NOTE — Progress Notes (Signed)
 S:     Chief Complaint  Patient presents with   Medication Management    Diabetes   58 y.o. female who presents for diabetes evaluation, education, and management. Patient arrives in  good spirits and presents without any assistance. Patient is accompanied by her daughter, Irena Manners.   Patient was referred and last seen by Primary Care Provider, Dr. Jari Merles, on 03/25/2022.    PMH is significant for diabetes and coughing.   Current diabetes medications include: Jardiance  (empagliflozin ) 25 mg once daily. Metformin  500 mg 2 tablets BID. Ozempic  (semaglutide ) 2 mg once weekly.  Current hypertension medications include: amlodipine  2.5 mg once daily  Patient reports adherence to taking all medications as prescribed.   Do you feel that your medications are working for you? yes Have you been experiencing any side effects to the medications prescribed? no Do you have any problems obtaining medications due to transportation or finances? no Insurance coverage: Medicaid  Patient denies hypoglycemic events.  Reported home fasting blood sugars: 242 mg/dL   Patient reports nocturia (nighttime urination).  Patient denies visual changes. Patient denies self foot exams.   Patient reported dietary habits: Eats 2 meals/day Breakfast: cereal Lunch: [place] breast, sandwich  Dinner: roast-beef soup, potatoes, greens Snacks: crackers Drinks: low-fat milk    O:   Review of Systems  Respiratory:  Positive for cough and shortness of breath.   Genitourinary:  Positive for flank pain.  Musculoskeletal:  Positive for back pain.  All other systems reviewed and are negative.   Physical Exam Vitals reviewed.  Constitutional:      Appearance: Normal appearance.  Neurological:     Mental Status: She is alert.  Psychiatric:        Mood and Affect: Mood normal.        Behavior: Behavior normal.        Thought Content: Thought content normal.        Judgment: Judgment normal.      Lab  Results  Component Value Date   HGBA1C 9.5 (H) 03/19/2023   Vitals:   04/03/23 0959  BP: 136/74  Pulse: 86  SpO2: 98%    Lipid Panel     Component Value Date/Time   CHOL 192 06/21/2020 1547   TRIG 131 06/21/2020 1547   HDL 54 06/21/2020 1547   CHOLHDL 3.6 06/21/2020 1547   LDLCALC 115 (H) 06/21/2020 1547   LDLDIRECT 117 (H) 02/28/2021 0917    Clinical Atherosclerotic Cardiovascular Disease (ASCVD): Yes The 10-year ASCVD risk score (Arnett DK, et al., 2019) is: 5.6%   Values used to calculate the score:     Age: 75 years     Sex: Female     Is Non-Hispanic African American: No     Diabetic: Yes     Tobacco smoker: No     Systolic Blood Pressure: 136 mmHg     Is BP treated: Yes     HDL Cholesterol: 54 mg/dL     Total Cholesterol: 192 mg/dL   Patient is participating in a Managed Medicaid Plan:  Yes   A/P: Diabetes longstanding currently uncontrolled reporting home readings of 240-300. Patient is able to verbalize appropriate hypoglycemia management plan. Medication adherence appears good. Control is suboptimal due to RSV - continued respiratory symptoms and pain / Stress. -Started basal insulin  Lantus  (insulin  glargine) 10 units daily in the morning. Patient will continue to titrate 1 unit every day if fasting blood sugar > 150mg /dl until fasting blood sugars reach goal or  next visit.  -Continued GLP-1 Ozempic  (semaglutide ) 2 mg once weekly -Continued SGLT2-I Jardiance  (empagliflozin ) 10 mg once daily.  -Continued metformin  500 mg 2 tablets BID.   -Counseled on s/sx of and management of hypoglycemia.  An oxygen test was performed.   Written patient instructions provided. Patient verbalized understanding of treatment plan.  Total time in face to face counseling 38 minutes.    Follow-up:  Pharmacist PRN PCP clinic visit in 04/15/2022 with Dr. Jari Merles Patient seen with Vivica Grounds, PharmD Candidate.

## 2023-04-05 ENCOUNTER — Telehealth: Payer: Self-pay

## 2023-04-05 NOTE — Telephone Encounter (Signed)
Patient calls nurse line regarding results from visit on 04/03/23. Advised of urine results per note from Dr. Jena Gauss and that we are waiting for CXR results to come back.   Advised patient that once we receive these results, we will contact her regarding next steps.   Patient voices understanding and is appreciative.   Veronda Prude, RN

## 2023-04-08 ENCOUNTER — Encounter: Payer: Self-pay | Admitting: Student

## 2023-04-08 NOTE — Telephone Encounter (Signed)
Patient calls nurse line requesting results from UA and CXR.   Discussed urine results again and advised the provider will contact her with next steps.   CXR is back for provider viewing.   Will forward.

## 2023-04-08 NOTE — Progress Notes (Signed)
'  Stigmata of dish within thoracic spine.'   No evidence of PNA or other pulmonary source of pain   Hey Dr. Lum Babe, I precepted this patient with you. Lower left rib pain ongoing since discharge from hospital for PNA. She had a few rbcs in dipstick. 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 I cannot see this documented elsewhere. Do you think this could contribute to pain? Do I need to obtain dedicated thoracic xray? Thanks   Nursing team, sent patient message about her results.  Routing result note to nursing and eniola

## 2023-04-09 ENCOUNTER — Other Ambulatory Visit: Payer: Self-pay | Admitting: Student

## 2023-04-09 DIAGNOSIS — M4810 Ankylosing hyperostosis [Forestier], site unspecified: Secondary | ICD-10-CM

## 2023-04-12 ENCOUNTER — Ambulatory Visit: Payer: Medicaid Other

## 2023-04-14 ENCOUNTER — Encounter: Payer: Self-pay | Admitting: Student

## 2023-04-16 ENCOUNTER — Ambulatory Visit (INDEPENDENT_AMBULATORY_CARE_PROVIDER_SITE_OTHER): Payer: Medicaid Other | Admitting: Family Medicine

## 2023-04-16 ENCOUNTER — Telehealth: Payer: Self-pay | Admitting: Family Medicine

## 2023-04-16 ENCOUNTER — Telehealth: Payer: Self-pay | Admitting: Cardiology

## 2023-04-16 ENCOUNTER — Ambulatory Visit: Payer: Medicaid Other | Admitting: Family Medicine

## 2023-04-16 VITALS — BP 131/71 | HR 98 | Ht 64.0 in | Wt 325.4 lb

## 2023-04-16 DIAGNOSIS — R059 Cough, unspecified: Secondary | ICD-10-CM | POA: Diagnosis not present

## 2023-04-16 NOTE — Patient Instructions (Addendum)
Good to see you today - Thank you for coming in  Things we discussed today:  1) Continue taking your Dulera twice a day. You can use it every 6 hours if you feel like you need additional control.   2) We ordered a xray to take a closer look at your back - You can go to [facility] and let them know you have a spine xray ordered  3) Call [facility] imaging to let them know that your doctor has ordered a DEXA scan for you.  They can help you schedule.  4) Here is the contact for the Cardiologist:   [facility] [address] [place], [place] [id number]

## 2023-04-16 NOTE — Progress Notes (Signed)
    SUBJECTIVE:   CHIEF COMPLAINT / HPI:   DM is a 58yo F w/ hx of T2DM, chronic abm pain, H pylori infxn, recent PNA that p/f cough. - Cough slightly imprioving but still there, but nothing is coming up.  She finds it embarrassing when she has these coughing fits in public and it causes her to get winded. - Reports having some occasional wheezing - Has been taking her dulera every 6 hours.  She is unsure if it has been helping.  -Has not yet seen cardiologist.  Needs their contact information.  OBJECTIVE:   BP 131/71   Pulse 98   Ht 5\' 4"  (1.626 m)   Wt (!) 325 lb 6.4 oz (147.6 kg)   LMP 04/05/2016   SpO2 98%   BMI 55.85 kg/m   General: Alert, pleasant well-appearing woman. NAD. HEENT: NCAT. MMM. CV: RRR, no murmurs. Cap refill <2. Resp:Mild end expiratory wheezing in BL lung bases. Normal WOB on RA.  Abm: Soft, nontender, nondistended. BS present. Ext: Moves all ext spontaneously Skin: Warm, well perfused   ASSESSMENT/PLAN:   Assessment & Plan Cough, unspecified type Has had a lingering cough since having pneumonia about 1 month ago, it has been slightly improving.  Suspect that this is sequela of her recovering pneumonia that is prolonged due to her underlying asthma and OSA.  Low concern for new infection given lack of fever and stable vitals. -Can continue Dulera twice daily, and can increase to every 6 hours as needed for asthma exacerbation. -Encouraged to continue using her CPAP machine  Previously had a spine x-ray and DEXA scan ordered by Dr. Jena Gauss.  Advised patient to get these done  Provided contact information for cardiology to schedule follow-up  Lincoln Brigham, MD [facility]

## 2023-04-16 NOTE — Telephone Encounter (Signed)
Patient called stating that she called [place] imaging about scheduling her DEXA scan, states she was told that [facility]

## 2023-04-16 NOTE — Telephone Encounter (Signed)
Patient wants a provider switch from Dr. Herbie Baltimore at [facility] to Dr. Elease Hashimoto at [facility] as this location is closer to her home.

## 2023-04-18 NOTE — Telephone Encounter (Signed)
No, we don't schedule Dexa Scans. Penni Bombard CMA

## 2023-04-19 ENCOUNTER — Ambulatory Visit (INDEPENDENT_AMBULATORY_CARE_PROVIDER_SITE_OTHER): Payer: Medicaid Other | Admitting: Family Medicine

## 2023-04-19 ENCOUNTER — Ambulatory Visit (HOSPITAL_COMMUNITY)
Admission: RE | Admit: 2023-04-19 | Discharge: 2023-04-19 | Disposition: A | Discharge status: Home / Self Care | Payer: Medicaid Other | Source: Ambulatory Visit | Attending: Family Medicine | Admitting: Family Medicine

## 2023-04-19 VITALS — BP 122/78 | HR 88 | Temp 98.5°F | Wt 320.8 lb

## 2023-04-19 DIAGNOSIS — R062 Wheezing: Secondary | ICD-10-CM | POA: Diagnosis not present

## 2023-04-19 DIAGNOSIS — J189 Pneumonia, unspecified organism: Secondary | ICD-10-CM

## 2023-04-19 DIAGNOSIS — J45901 Unspecified asthma with (acute) exacerbation: Secondary | ICD-10-CM

## 2023-04-19 DIAGNOSIS — R059 Cough, unspecified: Secondary | ICD-10-CM | POA: Diagnosis not present

## 2023-04-19 MED ORDER — PREDNISONE 50 MG PO TABS: 50.0000 mg | ORAL_TABLET | Freq: Every day | ORAL | 0 refills | Status: DC

## 2023-04-19 MED ORDER — DOXYCYCLINE HYCLATE 100 MG PO TABS: 100.0000 mg | ORAL_TABLET | Freq: Two times a day (BID) | ORAL | 0 refills | Status: DC

## 2023-04-19 NOTE — Progress Notes (Signed)
    SUBJECTIVE:   CHIEF COMPLAINT / HPI:   Seen 1/28 for cough Was advised to use Dulera twice daily, up to every 6 hours as needed  Since then having worsening symptoms Using Dulera every 6 hours - took this morning Using albuterol neb every 6 hours - most recently 0830 today  Was in the hospital about 109mo ago for RSV. Received prednisone, bronchodilators, and course of azithromycin Had persistent cough afterwards but her symptoms have now been worsening x 3 days Subjective fevers since yesterday Taking dayquil and nyquil   PERTINENT  PMH / PSH: History of recent pneumonia about 1 month ago, H. pylori infection, T2DM  OBJECTIVE:   BP 122/78   Pulse 88   Temp 98.5 F (36.9 C) (Oral)   Wt (!) 320 lb 12.8 oz (145.5 kg)   LMP 04/05/2016   SpO2 94%   BMI 55.07 kg/m   General: NAD, pleasant, able to participate in exam Cardiac: RRR, no murmurs auscultated Respiratory: Diffuse bilateral wheezes and rhonchi appreciated Abdomen: soft, non-tender, non-distended Extremities: warm and well perfused, no edema Skin: warm and dry, no rashes noted Neuro: alert, no obvious focal deficits, speech normal Psych: Normal affect and mood  ASSESSMENT/PLAN:   Assessment & Plan Community acquired pneumonia, unspecified laterality Given initial improvement in her symptoms with now recurrence in the setting of her asthma and wheezing/rhonchi on exam, suspect CAP.  She received azithromycin while in the hospital a month ago.  Rx doxycycline twice daily x 10 days, obtain chest x-ray. Exacerbation of asthma, unspecified asthma severity, unspecified whether persistent Wheezing on exam despite regular inhaler use at home.  Normal O2 sat here and afebrile although used Tylenol earlier today.  Rx prednisone x 5 days and doxycycline as above.  Cautious with steroids given her history of diabetes.  Discussed close return precautions and advised continued use of inhalers.  If no improvement recommend ED  evaluation.   Vonna Drafts, MD [facility]

## 2023-04-19 NOTE — Patient Instructions (Addendum)
Call: [facility] [address] [place], [place] [phone]  Please take prednisone daily for 5 days - keep a close eye on your blood sugar while on this  Take doxycycline twice daily for 10 days  Go to [place] for x ray  Follow up in 2 weeks or sooner as needed

## 2023-04-22 ENCOUNTER — Other Ambulatory Visit: Payer: Self-pay | Admitting: Family Medicine

## 2023-04-22 ENCOUNTER — Telehealth: Payer: Self-pay

## 2023-04-22 DIAGNOSIS — R051 Acute cough: Secondary | ICD-10-CM

## 2023-04-22 DIAGNOSIS — J189 Pneumonia, unspecified organism: Secondary | ICD-10-CM

## 2023-04-22 DIAGNOSIS — B3731 Acute candidiasis of vulva and vagina: Secondary | ICD-10-CM

## 2023-04-22 DIAGNOSIS — E118 Type 2 diabetes mellitus with unspecified complications: Secondary | ICD-10-CM

## 2023-04-22 MED ORDER — FLUCONAZOLE 150 MG PO TABS: 150.0000 mg | ORAL_TABLET | Freq: Once | ORAL | 0 refills | Status: AC

## 2023-04-22 MED ORDER — HYDROCODONE BIT-HOMATROP MBR 5-1.5 MG/5ML PO SOLN: 5.0000 mL | Freq: Every day | ORAL | 0 refills | Status: DC | PRN

## 2023-04-22 NOTE — Telephone Encounter (Signed)
Patient calls nurse line for multiple reasons.   (1) She is requesting a round of Diflucan. She reports she will get a yeast infection after taking the course of antibiotics given to her.   (2) She is requesting a prescription for cough medication. She reports she has tried "all" of the OTCs with no relief.   (3) She is requesting CXR results. Advised these are not back yet.   Will forward to provider who saw patient

## 2023-04-23 NOTE — Telephone Encounter (Signed)
Attempted to call patient to advise of below.   However, no answer or option for VM.   Will await her return call to advise.

## 2023-04-24 ENCOUNTER — Emergency Department (HOSPITAL_COMMUNITY)
Admission: EM | Admit: 2023-04-24 | Discharge: 2023-04-24 | Disposition: A | Discharge status: Home / Self Care | Payer: Medicaid Other | Attending: Emergency Medicine | Admitting: Emergency Medicine

## 2023-04-24 ENCOUNTER — Ambulatory Visit: Payer: Medicaid Other

## 2023-04-24 ENCOUNTER — Other Ambulatory Visit: Payer: Self-pay

## 2023-04-24 ENCOUNTER — Encounter (HOSPITAL_COMMUNITY): Payer: Self-pay

## 2023-04-24 ENCOUNTER — Other Ambulatory Visit (HOSPITAL_COMMUNITY): Payer: Medicaid Other

## 2023-04-24 ENCOUNTER — Ambulatory Visit: Payer: Medicaid Other | Admitting: Student

## 2023-04-24 ENCOUNTER — Emergency Department (HOSPITAL_COMMUNITY): Payer: Medicaid Other

## 2023-04-24 VITALS — BP 124/78 | HR 97 | Ht 64.0 in | Wt 317.2 lb

## 2023-04-24 DIAGNOSIS — R531 Weakness: Secondary | ICD-10-CM | POA: Diagnosis not present

## 2023-04-24 DIAGNOSIS — I10 Essential (primary) hypertension: Secondary | ICD-10-CM | POA: Diagnosis not present

## 2023-04-24 DIAGNOSIS — J101 Influenza due to other identified influenza virus with other respiratory manifestations: Secondary | ICD-10-CM | POA: Diagnosis not present

## 2023-04-24 DIAGNOSIS — R059 Cough, unspecified: Secondary | ICD-10-CM | POA: Diagnosis not present

## 2023-04-24 DIAGNOSIS — R0789 Other chest pain: Secondary | ICD-10-CM | POA: Diagnosis not present

## 2023-04-24 DIAGNOSIS — R0602 Shortness of breath: Secondary | ICD-10-CM | POA: Diagnosis not present

## 2023-04-24 DIAGNOSIS — R053 Chronic cough: Secondary | ICD-10-CM

## 2023-04-24 DIAGNOSIS — R079 Chest pain, unspecified: Secondary | ICD-10-CM

## 2023-04-24 DIAGNOSIS — J111 Influenza due to unidentified influenza virus with other respiratory manifestations: Secondary | ICD-10-CM

## 2023-04-24 DIAGNOSIS — I700 Atherosclerosis of aorta: Secondary | ICD-10-CM | POA: Diagnosis not present

## 2023-04-24 DIAGNOSIS — B974 Respiratory syncytial virus as the cause of diseases classified elsewhere: Secondary | ICD-10-CM | POA: Diagnosis not present

## 2023-04-24 DIAGNOSIS — R739 Hyperglycemia, unspecified: Secondary | ICD-10-CM | POA: Diagnosis not present

## 2023-04-24 DIAGNOSIS — R509 Fever, unspecified: Secondary | ICD-10-CM | POA: Diagnosis present

## 2023-04-24 DIAGNOSIS — Z20822 Contact with and (suspected) exposure to covid-19: Secondary | ICD-10-CM | POA: Insufficient documentation

## 2023-04-24 LAB — COMPREHENSIVE METABOLIC PANEL
ALT: 24 U/L (ref 0–44)
AST: 20 U/L (ref 15–41)
Albumin: 4 g/dL (ref 3.5–5.0)
Alkaline Phosphatase: 77 U/L (ref 38–126)
Anion gap: 15 (ref 5–15)
BUN: 8 mg/dL (ref 6–20)
CO2: 22 mmol/L (ref 22–32)
Calcium: 9.1 mg/dL (ref 8.9–10.3)
Chloride: 99 mmol/L (ref 98–111)
Creatinine, Ser: 0.65 mg/dL (ref 0.44–1.00)
GFR, Estimated: >60 mL/min (ref >60)
Glucose, Bld: 285 mg/dL — ABNORMAL HIGH (ref 70–99)
Potassium: 3.3 mmol/L — ABNORMAL LOW (ref 3.5–5.1)
Sodium: 136 mmol/L (ref 135–145)
Total Bilirubin: 0.4 mg/dL (ref 0.0–1.2)
Total Protein: 7.6 g/dL (ref 6.5–8.1)

## 2023-04-24 LAB — CBC WITH DIFFERENTIAL/PLATELET
Abs Immature Granulocytes: 0.11 10*3/uL — ABNORMAL HIGH (ref 0.00–0.07)
Basophils Absolute: 0 10*3/uL (ref 0.0–0.1)
Basophils Relative: 0 %
Eosinophils Absolute: 0 10*3/uL (ref 0.0–0.5)
Eosinophils Relative: 0 %
HCT: 44.2 % (ref 36.0–46.0)
Hemoglobin: 14.1 g/dL (ref 12.0–15.0)
Immature Granulocytes: 1 %
Lymphocytes Relative: 35 %
Lymphs Abs: 4.1 10*3/uL — ABNORMAL HIGH (ref 0.7–4.0)
MCH: 24 pg — ABNORMAL LOW (ref 26.0–34.0)
MCHC: 31.9 g/dL (ref 30.0–36.0)
MCV: 75.3 fL — ABNORMAL LOW (ref 80.0–100.0)
Monocytes Absolute: 0.7 10*3/uL (ref 0.1–1.0)
Monocytes Relative: 6 %
Neutro Abs: 6.6 10*3/uL (ref 1.7–7.7)
Neutrophils Relative %: 58 %
Platelets: 335 10*3/uL (ref 150–400)
RBC: 5.87 MIL/uL — ABNORMAL HIGH (ref 3.87–5.11)
RDW: 17 % — ABNORMAL HIGH (ref 11.5–15.5)
WBC: 11.6 10*3/uL — ABNORMAL HIGH (ref 4.0–10.5)
nRBC: 0 % (ref 0.0–0.2)

## 2023-04-24 LAB — RESP PANEL BY RT-PCR (RSV, FLU A&B, COVID)  RVPGX2
Influenza A by PCR: POSITIVE — AB
Influenza B by PCR: NEGATIVE
Resp Syncytial Virus by PCR: NEGATIVE
SARS Coronavirus 2 by RT PCR: NEGATIVE

## 2023-04-24 LAB — BRAIN NATRIURETIC PEPTIDE: B Natriuretic Peptide: 22.3 pg/mL (ref 0.0–100.0)

## 2023-04-24 LAB — TROPONIN I (HIGH SENSITIVITY)
Troponin I (High Sensitivity): 7 ng/L (ref <18)
Troponin I (High Sensitivity): 7 ng/L (ref <18)

## 2023-04-24 LAB — D-DIMER, QUANTITATIVE: D-Dimer, Quant: 0.55 ug{FEU}/mL — ABNORMAL HIGH (ref 0.00–0.50)

## 2023-04-24 LAB — POCT GLUCOSE FINGERSTICK: Glucose: 305 — AB (ref 70–99)

## 2023-04-24 MED ORDER — POTASSIUM CHLORIDE CRYS ER 20 MEQ PO TBCR
20.0000 meq | EXTENDED_RELEASE_TABLET | Freq: Once | ORAL | Status: AC
  Administered 2023-04-24: 20 meq via ORAL
  Filled 2023-04-24: qty 1

## 2023-04-24 MED ORDER — ASPIRIN 325 MG PO TABS
325.0000 mg | ORAL_TABLET | Freq: Once | ORAL | Status: AC
  Administered 2023-04-24: 325 mg via ORAL

## 2023-04-24 MED ORDER — OSELTAMIVIR PHOSPHATE 75 MG PO CAPS
75.0000 mg | ORAL_CAPSULE | Freq: Once | ORAL | Status: AC
  Administered 2023-04-24: 75 mg via ORAL
  Filled 2023-04-24: qty 1

## 2023-04-24 MED ORDER — OSELTAMIVIR PHOSPHATE 75 MG PO CAPS: 75.0000 mg | ORAL_CAPSULE | Freq: Two times a day (BID) | ORAL | 0 refills | Status: DC

## 2023-04-24 MED ORDER — ONDANSETRON HCL 4 MG PO TABS: 4.0000 mg | ORAL_TABLET | Freq: Four times a day (QID) | ORAL | 0 refills | Status: DC

## 2023-04-24 MED ORDER — ONDANSETRON 4 MG PO TBDP
4.0000 mg | ORAL_TABLET | Freq: Once | ORAL | Status: AC
  Administered 2023-04-24: 4 mg via ORAL
  Filled 2023-04-24: qty 1

## 2023-04-24 NOTE — ED Provider Notes (Signed)
 [place] EMERGENCY DEPARTMENT AT [facility] Provider Note   CSN: [id number] Arrival date & time: 04/24/23  1217     History Chief Complaint  Patient presents with   Chest Pain    HPI Rita Bass is a 58 y.o. female presenting for chief complaint of chest pain.  States she has been having fever cough congestion for 10 days.  Admitted earlier in the month for RSV infection presents similarly with fever cough congestion.  Seems of caused a acute respiratory syndrome at that time and she was admitted for acute hypoxic respiratory failure.  She is fully recovered until 4 days ago her symptoms recurred.  Ambulatory tolerating p.o. intake otherwise.  Patient's recorded medical, surgical, social, medication list and allergies were reviewed in the Snapshot window as part of the initial history.   Review of Systems   Review of Systems  Constitutional:  Positive for fever. Negative for chills.  HENT:  Negative for ear pain and sore throat.   Eyes:  Negative for pain and visual disturbance.  Respiratory:  Positive for cough, shortness of breath and wheezing.   Cardiovascular:  Negative for chest pain and palpitations.  Gastrointestinal:  Negative for abdominal pain and vomiting.  Genitourinary:  Negative for dysuria and hematuria.  Musculoskeletal:  Negative for arthralgias and back pain.  Skin:  Negative for color change and rash.  Neurological:  Negative for seizures and syncope.  All other systems reviewed and are negative.   Physical Exam Updated Vital Signs BP 118/68   Pulse 91   Temp 98.5 F (36.9 C) (Oral)   Resp (!) 26   Ht 5' 4 (1.626 m)   Wt (!) 143.8 kg   LMP 04/05/2016   SpO2 94%   BMI 54.41 kg/m  Physical Exam Vitals and nursing note reviewed.  Constitutional:      General: She is not in acute distress.    Appearance: She is well-developed.  HENT:     Head: Normocephalic and atraumatic.  Eyes:     Conjunctiva/sclera: Conjunctivae normal.   Cardiovascular:     Rate and Rhythm: Normal rate and regular rhythm.     Heart sounds: No murmur heard. Pulmonary:     Effort: Pulmonary effort is normal. No respiratory distress.     Breath sounds: Normal breath sounds.  Abdominal:     General: There is no distension.     Palpations: Abdomen is soft.     Tenderness: There is no abdominal tenderness. There is no right CVA tenderness or left CVA tenderness.  Musculoskeletal:        General: No swelling or tenderness. Normal range of motion.     Cervical back: Neck supple.  Skin:    General: Skin is warm and dry.  Neurological:     General: No focal deficit present.     Mental Status: She is alert and oriented to person, place, and time. Mental status is at baseline.     Cranial Nerves: No cranial nerve deficit.      ED Course/ Medical Decision Making/ A&P    Procedures Procedures   Medications Ordered in ED Medications  oseltamivir  (TAMIFLU ) capsule 75 mg (has no administration in time range)  ondansetron  (ZOFRAN -ODT) disintegrating tablet 4 mg (has no administration in time range)  potassium chloride  SA (KLOR-CON  M) CR tablet 20 mEq (has no administration in time range)   Medical Decision Making:   Rita Bass is a 58 y.o. female who presented to the  ED today with subjective fever, cough, congestion detailed above.    Additional history discussed with patient's family/caregivers.  Patient placed on continuous vitals and telemetry monitoring while in ED which was reviewed periodically.   Complete initial physical exam performed, notably the patient  was hemodynamically stable in no acute distress.  Posterior oropharynx illuminated and without obvious swelling or deformity.  Patient is without neck stiffness.    Reviewed and confirmed nursing documentation for past medical history, family history, social history.    Initial Assessment:   With the patient's presentation of fever cough congestion, most likely  diagnosis is developing viral upper respiratory infection. Other diagnoses were considered including (but not limited to) peritonsillar abscess, retropharyngeal abscess, pneumonia. These are considered less likely due to history of present illness and physical exam findings.   This is most consistent with an acute life/limb threatening illness complicated by underlying chronic conditions. Considered meningitis, however patient's symptoms, vital signs, physical exam findings including lack of meningismus seem grossly less consistent at this time. Initial Plan:  Screening labs including CBC and Metabolic panel to evaluate for infectious or metabolic etiology of disease.  Viral screening including COVID/flu testing to evaluate for common viral etiologies that need to be tracked CXR to evaluate for structural/infectious intrathoracic pathology.  Empiric treatment with antipyretics including acetaminophen  in ambulatory setting EKG and delta troponin to rule out ACS Otherwise low HEART score DDIMER to rule out PE 2/2 recent admission (Age adjusted negative) Objective evaluation as below reviewed   Initial Study Results:   Laboratory  All laboratory results reviewed without evidence of clinically relevant pathology.    Radiology:  All images reviewed independently. Agree with radiology report at this time.   DG Chest 2 View  Final Result         Final Assessment and Plan:   On reassessment, patient is ambulatory tolerating p.o. intake in no acute distress.   + Flu. Observed in the emergency room for 6 hours.  Vital signs are main stable.  While she is at rest, she is satting 99% on room air with only mild tachypnea at 26.  Heart rate stable in the low 90s.  Patient is currently stable for outpatient care and management with no indication for hospitalization or transfer at this time.  Discussed all findings with patient expressed understanding.  Disposition:  Based on the above findings, I  believe patient is stable for discharge.    Patient/family educated about specific return precautions for given chief complaint and symptoms.  Patient/family educated about follow-up with PCP.     Patient/family expressed understanding of return precautions and need for follow-up. Patient spoken to regarding all imaging and laboratory results and appropriate follow up for these results. All education provided in verbal form with additional information in written form. Time was allowed for answering of patient questions. Patient discharged.    Emergency Department Medication Summary:   Medications  oseltamivir  (TAMIFLU ) capsule 75 mg (has no administration in time range)  ondansetron  (ZOFRAN -ODT) disintegrating tablet 4 mg (has no administration in time range)  potassium chloride  SA (KLOR-CON  M) CR tablet 20 mEq (has no administration in time range)    Clinical Impression:  1. Chest pain, unspecified type   2. Influenza      Discharge   Final Clinical Impression(s) / ED Diagnoses Final diagnoses:  Chest pain, unspecified type  Influenza    Rx / DC Orders ED Discharge Orders     None  Jerral Meth, MD 04/24/23 1818

## 2023-04-24 NOTE — Assessment & Plan Note (Addendum)
 Patient comes in with cough that is been persistent since her hospitalization in January.  Patient is tried 5-day course of prednisone , and continues to take 10-day course of doxycycline , without relief of symptoms.  Patient had chest x-ray they did not show infiltrate or volume overloaded status however patient does note she is sleeping on 3 pillows at night to keep her head elevated to breathe.  Patient denies any fevers, but did have episode of diarrhea and nausea a day ago.  Patient's vitals are well, patient was walked with pulse ox and developed chest pain and tachycardia to 140's. EKG was obtained that was normal. Given her symptoms and change in vitals, we elected to send her to the Emergency Department, via EMS.  -Go to ED

## 2023-04-24 NOTE — ED Triage Notes (Signed)
 Pt to ED via [facility] from PCP c/o cough and chest pain. Pt over past month had RSV, was treated for it with ABX and prednisone  , at follow up today still c/o cough, noted to have exertional chest pain and high HR with ambulation. Pt voicing no complaints at rest.  Asprin 324mg  given at PCP , No meds given by EMS.  Last VS: 176/96, 97%ra 80HR 305CBG

## 2023-04-24 NOTE — Patient Instructions (Signed)
Patient was sent to ED via EMS.

## 2023-04-24 NOTE — ED Provider Triage Note (Signed)
 Emergency Medicine Provider Triage Evaluation Note  Rita Bass , a 58 y.o. female  was evaluated in triage.  Pt complains of chest pain shortness of breath.  Recent recovery from RSV.  Treated for community-acquired pneumonia with Doxy x 10 days and prednisone  x 5 days.  Was seen by PCP earlier today.  Cough is worsened she has some chest pain.  Became tachycardic and short of breath while ambulated with pulse oximetry at PCP office and was directed here for further evaluation.  No chest pain at rest.  No anticoagulation  Review of Systems  Positive: As above Negative: As above  Physical Exam  BP 116/66   Pulse 94   Temp 98.4 F (36.9 C) (Oral)   Resp 18   Ht 5' 4 (1.626 m)   Wt (!) 143.8 kg   LMP 04/05/2016   SpO2 98%   BMI 54.41 kg/m  Gen:   Awake, no distress   Resp:  Normal effort  MSK:   Moves extremities without difficulty  Other:    Medical Decision Making  Medically screening exam initiated at 12:48 PM.  Appropriate orders placed.  Rita Bass was informed that the remainder of the evaluation will be completed by another provider, this initial triage assessment does not replace that evaluation, and the importance of remaining in the ED until their evaluation is complete.  Patient afebrile hemodynamically stable comfortable on room air.  Workup started from triage for chest pain shortness of breath.  Remainder of care per ED team   Pamella Ozell LABOR, DO 04/24/23 1250

## 2023-04-24 NOTE — Progress Notes (Signed)
  SUBJECTIVE:   CHIEF COMPLAINT / HPI:   Cough -Seen 04/19/23 for CAP Tx w/ doxy x 10 days and prednisone  x 5 days, CXR pending  Today: Cough has gotten worse, continues to have back/side/rib pain. Has tried Tylenol  cold/flu, mucinex  and robitussan w/o relief. Medicine she got for her night cough, also didn't help. Is also feeling week and has lost 8 lbs. Finished the prednisone  and is still taking the doxy. Nothing is coming up with cough. Feels warm, but hasn't taken her temperature. Had diarrhea 2 days ago, and nausea yesterday. Feels weak and tired. Also appreciating SOB w/ activity.   PERTINENT  PMH / PSH:    OBJECTIVE:  BP 124/78   Pulse 97   Ht 5' 4 (1.626 m)   Wt (!) 317 lb 3.2 oz (143.9 kg)   LMP 04/05/2016   SpO2 97%   BMI 54.45 kg/m  Physical Exam Constitutional:      General: She is in acute distress.     Appearance: She is ill-appearing.  Cardiovascular:     Rate and Rhythm: Normal rate and regular rhythm.     Pulses: Normal pulses.     Heart sounds: Normal heart sounds. No murmur heard.    No friction rub. No gallop.  Pulmonary:     Effort: Pulmonary effort is normal. No respiratory distress.     Breath sounds: Normal breath sounds. No stridor. No wheezing, rhonchi or rales.  Neurological:     Mental Status: She is alert.      ASSESSMENT/PLAN:   Assessment & Plan Chronic cough Patient comes in with cough that is been persistent since her hospitalization in January.  Patient is tried 5-day course of prednisone , and continues to take 10-day course of doxycycline , without relief of symptoms.  Patient had chest x-ray they did not show infiltrate or volume overloaded status however patient does note she is sleeping on 3 pillows at night to keep her head elevated to breathe.  Patient denies any fevers, but did have episode of diarrhea and nausea a day ago.  Patient's vitals are well, patient was walked with pulse ox and developed chest pain and tachycardia to 140's.  EKG was obtained that was normal. Given her symptoms and change in vitals, we elected to send her to the Emergency Department, via EMS.  -Go to ED Chest pain, unspecified type  No follow-ups on file. Penne Rhein, MD 04/24/2023, 11:05 AM PGY-3, [facility]

## 2023-04-25 ENCOUNTER — Encounter: Payer: Self-pay | Admitting: Family Medicine

## 2023-04-30 ENCOUNTER — Ambulatory Visit: Payer: Medicaid Other | Admitting: Pharmacist

## 2023-05-01 ENCOUNTER — Ambulatory Visit: Payer: Medicaid Other

## 2023-05-02 ENCOUNTER — Encounter: Payer: Self-pay | Admitting: Pharmacist

## 2023-05-02 ENCOUNTER — Ambulatory Visit: Payer: Medicaid Other | Admitting: Pharmacist

## 2023-05-02 VITALS — BP 126/81 | HR 89 | Wt 319.8 lb

## 2023-05-02 DIAGNOSIS — E118 Type 2 diabetes mellitus with unspecified complications: Secondary | ICD-10-CM | POA: Diagnosis not present

## 2023-05-02 DIAGNOSIS — I10 Essential (primary) hypertension: Secondary | ICD-10-CM | POA: Diagnosis not present

## 2023-05-02 MED ORDER — BENZONATATE 100 MG PO CAPS: 100.0000 mg | ORAL_CAPSULE | Freq: Four times a day (QID) | ORAL | 0 refills | Status: DC | PRN

## 2023-05-02 MED ORDER — IPRATROPIUM-ALBUTEROL 0.5-2.5 (3) MG/3ML IN SOLN: 3.0000 mL | Freq: Four times a day (QID) | RESPIRATORY_TRACT | 0 refills | Status: DC | PRN

## 2023-05-02 NOTE — Progress Notes (Signed)
S:     Chief Complaint  Patient presents with   Medication Management    Diabetes management    58 y.o. female who presents for diabetes evaluation, education, and management. Patient arrives in  good spirits and presents without any assistance. Patient is accompanied by daughter, [name].   Patient reports still having chronic cough with tenderness around chest/back/ribs. Cough reported as dry and has been using robitussin, benzonatate, and hydrocodone cough syrup with minimal relief. Reports using nebulizer treatments (proventil and duoneb) every 6 hours. Finished course of prednisone, doxy, and tamiflu.   Patient was referred and last seen by Primary Care Provider, Dr. Barbaraann Faster, on 04/24/23.   PMH is significant for diastolic dysfunction, HTN, asthma, lung nodule, chronic diarrhea, T2DM, OSA.  At last visit, patient was sent to Emergency Department via EMS based on her symptoms and vital signs (chest pain + tachycardia).   Current diabetes medications include: insulin glargine (Lantus) 10 units daily, metformin 1000 mg BID, empagliflozin (Jardiance) 25 mg daily, semaglutide (Ozempic) 2 mg weekly Current hypertension medications include: amlodipine 2.5 mg daily  Patient reports adherence to taking all medications as prescribed.   Do you feel that your medications are working for you? yes Have you been experiencing any side effects to the medications prescribed? no Do you have any problems obtaining medications due to transportation or finances? no Insurance coverage: [employer]  Patient denies hypoglycemic events.  Reported home blood sugars: 360 mg/dL    O:   Review of Systems  Respiratory:  Positive for cough, shortness of breath and wheezing.     Physical Exam Pulmonary:     Breath sounds: Wheezing present.  Chest:     Chest wall: Tenderness present.  Neurological:     Mental Status: She is alert.     Lab Results  Component Value Date   HGBA1C  9.5 (H) 03/19/2023   Vitals:   05/02/23 0947  BP: 126/81  Pulse: 89  SpO2: 98%    Lipid Panel     Component Value Date/Time   CHOL 192 06/21/2020 1547   TRIG 131 06/21/2020 1547   HDL 54 06/21/2020 1547   CHOLHDL 3.6 06/21/2020 1547   LDLCALC 115 (H) 06/21/2020 1547   LDLDIRECT 117 (H) 02/28/2021 0917    Clinical Atherosclerotic Cardiovascular Disease (ASCVD): Yes The 10-year ASCVD risk score (Arnett DK, et al., 2019) is: 4.8%   Values used to calculate the score:     Age: 79 years     Sex: Female     Is Non-Hispanic African American: No     Diabetic: Yes     Tobacco smoker: No     Systolic Blood Pressure: 126 mmHg     Is BP treated: Yes     HDL Cholesterol: 54 mg/dL     Total Cholesterol: 192 mg/dL   Patient is participating in a Managed Medicaid Plan:  Yes   A/P: Diabetes longstanding currently uncontrolled. Patient is able to verbalize appropriate hypoglycemia management plan. Medication adherence appears good. Control is suboptimal due to ongoing respiratory illness and A1c 8.8 (02/2023). -Increased dose of basal insulin Lantus from 10 units to 20 units daily in the morning.  -Restarted GLP-1 Ozempic (semaglutide) 2 mg weekly.  -Continued SGLT2-I Jardiance (empagliflozin) 10 mg. Counseled on sick day rules. -Continued metformin 1000 mg BID.  -Patient educated on purpose, proper use, and potential adverse effects.  -Extensively discussed pathophysiology of diabetes, recommended lifestyle interventions, dietary effects on blood sugar  control.  -Counseled on s/sx of and management of hypoglycemia.  -Next A1c anticipated March 2025.   Hypertension longstanding currently controlled. Blood pressure goal of <130/80 mmHg. Medication adherence good.  -Continued amlodipine 2.5 mg.  Written patient instructions provided. Patient verbalized understanding of treatment plan.  Total time in face to face counseling 28 minutes.    Follow-up:  Pharmacist TBD PCP clinic visit  in 05/13/23 - Dr. Sherrilee Gilles Patient seen with Lavona Mound, PharmD Candidate and Pearletha Forge, PharmD Candidate.

## 2023-05-02 NOTE — Assessment & Plan Note (Signed)
Diabetes longstanding currently uncontrolled. Patient is able to verbalize appropriate hypoglycemia management plan. Medication adherence appears good. Control is suboptimal due to ongoing respiratory illness and A1c 8.8 (02/2023). -Increased dose of basal insulin Lantus from 10 units to 20 units daily in the morning.  -Restarted GLP-1 Ozempic (semaglutide) 2 mg weekly.  -Continued SGLT2-I Jardiance (empagliflozin) 10 mg. Counseled on sick day rules. -Continued metformin 1000 mg BID.  -Patient educated on purpose, proper use, and potential adverse effects.  -Extensively discussed pathophysiology of diabetes, recommended lifestyle interventions, dietary effects on blood sugar control.

## 2023-05-02 NOTE — Patient Instructions (Addendum)
It was nice to see you today!  Your goal blood sugar is 80-130 before eating and less than 180 after eating.  Medication Changes:  Increase insulin glargine (Lantus) from 10 units daily to 20 units daily.  Restart semaglutide (Ozempic) 2 mg weekly.   Continue all other medication the same.   Monitor blood sugars at home and keep a log (glucometer or piece of paper) to bring with you to your next visit.  Keep up the good work with diet and exercise. Aim for a diet full of vegetables, fruit and lean meats (chicken, [place], fish). Try to limit salt intake by eating fresh or frozen vegetables (instead of canned), rinse canned vegetables prior to cooking and do not add any additional salt to meals.

## 2023-05-02 NOTE — Assessment & Plan Note (Signed)
Hypertension longstanding currently controlled. Blood pressure goal of <130/80 mmHg. Medication adherence good.  -Continued amlodipine 2.5 mg.

## 2023-05-03 NOTE — Progress Notes (Signed)
Reviewed and agree with Dr Macky Lower plan.

## 2023-05-05 ENCOUNTER — Other Ambulatory Visit: Payer: Self-pay | Admitting: Family Medicine

## 2023-05-05 DIAGNOSIS — R0609 Other forms of dyspnea: Secondary | ICD-10-CM

## 2023-05-09 ENCOUNTER — Encounter: Payer: Medicaid Other | Admitting: Gastroenterology

## 2023-05-13 ENCOUNTER — Ambulatory Visit (INDEPENDENT_AMBULATORY_CARE_PROVIDER_SITE_OTHER): Payer: Medicaid Other | Admitting: Family Medicine

## 2023-05-13 ENCOUNTER — Other Ambulatory Visit: Payer: Self-pay | Admitting: Family Medicine

## 2023-05-13 VITALS — BP 128/76 | HR 88 | Wt 325.0 lb

## 2023-05-13 DIAGNOSIS — R1031 Right lower quadrant pain: Secondary | ICD-10-CM | POA: Diagnosis not present

## 2023-05-13 DIAGNOSIS — R1033 Periumbilical pain: Secondary | ICD-10-CM | POA: Diagnosis not present

## 2023-05-13 DIAGNOSIS — G8929 Other chronic pain: Secondary | ICD-10-CM | POA: Diagnosis not present

## 2023-05-13 DIAGNOSIS — R300 Dysuria: Secondary | ICD-10-CM | POA: Diagnosis not present

## 2023-05-13 LAB — POCT UA - MICROSCOPIC ONLY
Bacteria, U Microscopic: NONE SEEN
WBC, Ur, HPF, POC: NONE SEEN (ref 0–5)

## 2023-05-13 LAB — POCT URINALYSIS DIP (MANUAL ENTRY)
Bilirubin, UA: NEGATIVE
Blood, UA: NEGATIVE
Glucose, UA: >=1000 mg/dL — AB
Ketones, POC UA: NEGATIVE mg/dL
Leukocytes, UA: NEGATIVE
Nitrite, UA: NEGATIVE
Protein Ur, POC: NEGATIVE mg/dL
Spec Grav, UA: 1.015 (ref 1.010–1.025)
Urobilinogen, UA: 0.2 U/dL
pH, UA: 5.5 (ref 5.0–8.0)

## 2023-05-13 MED ORDER — TRAMADOL HCL 50 MG PO TABS: 50.0000 mg | ORAL_TABLET | Freq: Two times a day (BID) | ORAL | 0 refills | Status: DC | PRN

## 2023-05-13 MED ORDER — TAMSULOSIN HCL 0.4 MG PO CAPS: 0.4000 mg | ORAL_CAPSULE | Freq: Every day | ORAL | 0 refills | Status: DC

## 2023-05-13 MED ORDER — DULOXETINE HCL 20 MG PO CPEP
20.0000 mg | ORAL_CAPSULE | Freq: Every day | ORAL | 3 refills | Status: DC
Start: 2023-05-13 — End: 2023-06-18

## 2023-05-13 NOTE — Patient Instructions (Signed)
 Good to see you today - Thank you for coming in  Things we discussed today:  1) For your kidney pain - I will order a CT scan to look for a kidney stone.  We will help schedule that and call you when it is approved by your insurance. -Start taking Flomax 1 tablet once a day.  This helps relax your urinary tract, and decreases your urinary pain. -You can strain your urine or look in the toilet bowl over the next few weeks to see if you notice any stones being passed in your urine. -I am also sending a referral for you to see a urologist.    Come back to see me in 2 weeks to follow-up on your urine symptoms.

## 2023-05-13 NOTE — Progress Notes (Signed)
    SUBJECTIVE:   CHIEF COMPLAINT / HPI:   DM is a 58yo F w/ hx of T2DM, asthma, h pylori infxn, prior kidney stones that presents for flank pain -Reports past few day history of right sided flank pain.  Pain is described as sharp.  Pain radiates from right flank to right groin -Pain is exacerbated when she tries to urinate. -Has history of kidney stones that required surgery in the past - Denies fever, blood in urine  OBJECTIVE:   BP 128/76   Pulse 88   Wt (!) 325 lb (147.4 kg)   LMP 04/05/2016   SpO2 95%   BMI 55.79 kg/m   General: Alert, pleasant uncomfortable appearing woman. NAD. HEENT: NCAT. MMM. CV: RRR, no murmurs. Cap refill <2. Resp: CTAB, no wheezing or crackles. Normal WOB on RA.  Abm: R flank pain to palpation.generalized discomfort to palpation diffusely in all quadrants.  No rebound, rigidity, guarding. Ext: Moves all ext spontaneously Skin: Warm, well perfused    ASSESSMENT/PLAN:   Assessment & Plan Dysuria Differential includes nephrolithiasis, UTI, ovarian mass.  Given history of kidney stones, association of symptoms with urination, patient's presentation is most likely due to nephrolithiasis.  No systemic signs of infection, that would suggest pyelonephritis. - Start flomax 0.4mg  daily for 30 day course - CT renal stone study ordered - Urology referral - BMP to assess kidney function -Follow-up in 2 weeks - refill of chronic tramadol to help with pain control      Lincoln Brigham, MD [facility]

## 2023-05-14 LAB — BASIC METABOLIC PANEL
BUN/Creatinine Ratio: 14 (ref 9–23)
BUN: 8 mg/dL (ref 6–24)
CO2: 22 mmol/L (ref 20–29)
Calcium: 8.5 mg/dL — ABNORMAL LOW (ref 8.7–10.2)
Chloride: 101 mmol/L (ref 96–106)
Creatinine, Ser: 0.57 mg/dL (ref 0.57–1.00)
Glucose: 337 mg/dL — ABNORMAL HIGH (ref 70–99)
Potassium: 4.2 mmol/L (ref 3.5–5.2)
Sodium: 140 mmol/L (ref 134–144)
eGFR: 107 mL/min/{1.73_m2} (ref >59)

## 2023-05-15 LAB — URINE CULTURE

## 2023-05-17 ENCOUNTER — Telehealth: Payer: Self-pay

## 2023-05-17 DIAGNOSIS — R300 Dysuria: Secondary | ICD-10-CM

## 2023-05-17 NOTE — Telephone Encounter (Signed)
 Patient calls nurse line regarding lab results and next steps.   Will forward to PCP regarding lab results and Melvenia Beam regarding prior auth status for CT Stone study.   Veronda Prude, RN

## 2023-05-20 NOTE — Telephone Encounter (Signed)
 Correction  Renal US scheduled for 3/6 at 10am at [facility].   Patient aware of the change.

## 2023-05-20 NOTE — Telephone Encounter (Signed)
 Patient calls nurse line in regards to urine results and imaging.   Patient advised her urine did not show signs of infection or blood.   Advised next step is to FU with imaging.   Advised her CT was denied by insurance, however we will get a renal US.   Patient scheduled for 3/17 at [facility].   Patient aware.

## 2023-05-23 ENCOUNTER — Ambulatory Visit (HOSPITAL_COMMUNITY)

## 2023-05-26 ENCOUNTER — Other Ambulatory Visit: Payer: Self-pay | Admitting: Gastroenterology

## 2023-05-29 ENCOUNTER — Ambulatory Visit: Payer: Medicaid Other | Admitting: Gastroenterology

## 2023-05-30 ENCOUNTER — Ambulatory Visit (HOSPITAL_COMMUNITY)

## 2023-06-03 ENCOUNTER — Other Ambulatory Visit (HOSPITAL_COMMUNITY)

## 2023-06-04 ENCOUNTER — Ambulatory Visit: Payer: Medicaid Other | Admitting: Family Medicine

## 2023-06-05 ENCOUNTER — Ambulatory Visit: Admitting: Family Medicine

## 2023-06-07 ENCOUNTER — Other Ambulatory Visit: Payer: Self-pay

## 2023-06-07 ENCOUNTER — Ambulatory Visit (HOSPITAL_COMMUNITY): Admission: RE | Admit: 2023-06-07 | Source: Ambulatory Visit

## 2023-06-07 DIAGNOSIS — R1033 Periumbilical pain: Secondary | ICD-10-CM

## 2023-06-09 ENCOUNTER — Other Ambulatory Visit: Payer: Self-pay | Admitting: Family Medicine

## 2023-06-09 DIAGNOSIS — R300 Dysuria: Secondary | ICD-10-CM

## 2023-06-10 ENCOUNTER — Ambulatory Visit (INDEPENDENT_AMBULATORY_CARE_PROVIDER_SITE_OTHER): Admitting: Family Medicine

## 2023-06-10 ENCOUNTER — Encounter: Payer: Self-pay | Admitting: Family Medicine

## 2023-06-10 ENCOUNTER — Other Ambulatory Visit: Payer: Self-pay | Admitting: Family Medicine

## 2023-06-10 VITALS — BP 136/84 | Ht 64.0 in | Wt 323.0 lb

## 2023-06-10 DIAGNOSIS — E118 Type 2 diabetes mellitus with unspecified complications: Secondary | ICD-10-CM

## 2023-06-10 DIAGNOSIS — M170 Bilateral primary osteoarthritis of knee: Secondary | ICD-10-CM | POA: Diagnosis not present

## 2023-06-10 DIAGNOSIS — Z6841 Body Mass Index (BMI) 40.0 and over, adult: Secondary | ICD-10-CM | POA: Diagnosis not present

## 2023-06-10 DIAGNOSIS — R300 Dysuria: Secondary | ICD-10-CM

## 2023-06-10 MED ORDER — TRAMADOL HCL 50 MG PO TABS: 50.0000 mg | ORAL_TABLET | Freq: Two times a day (BID) | ORAL | 0 refills | Status: DC | PRN

## 2023-06-10 MED ORDER — METHOCARBAMOL 500 MG PO TABS: 500.0000 mg | ORAL_TABLET | Freq: Three times a day (TID) | ORAL | 1 refills | Status: DC | PRN

## 2023-06-10 NOTE — Assessment & Plan Note (Signed)
 BMI = 55  Plan: -Patient with elevated BMI and chronic bilateral knee pain with underlying osteoarthritis.  Patient would benefit from consideration of knee replacement, but due to her obesity, she would need to lose weight before she would be a good candidate for surgery -Will continue with conservative therapy at this time for her knee arthritis as noted above -She will continue work with PCP for ongoing weight loss

## 2023-06-10 NOTE — Assessment & Plan Note (Signed)
 Acute on chronic bilateral knee pain, right greater than left, with underlying osteoarthritis -Limited response to prior cortisone injection 01/07/2023 with Dr. Pearletha Forge, only lasted approximately a month -Patient would benefit from consideration of knee replacement, but due to her obesity and BMI 55, she would need to lose weight before she would be a good candidate for surgery  Plan: -Previous visit notes from 01/07/2023 and 10/03/2022 in our office reviewed -Patient has had limited response to prior cortisone injections in July 2024 in October 2024, only lasting approximately 1 month.  Explained to patient that I do not recommend further cortisone injections at this time since she has not had adequate response.  We could potentially make her arthritis worse, but her diabetes is currently poorly controlled and future cortisone injections could exacerbate her blood sugars as well -Discussed possible hyaluronic acid injections versus referral for genicular nerve blocks with Dr. Huntley Estelle in our office.  She would like to investigate both options.  She is scheduled with Dr Huntley Estelle later this week Friday, 06/14/2023 to discuss possible geniculate nerve blocks. -Will also submit authorization to insurance company for consideration of HA injections -She will continue her tramadol and gabapentin as prescribed by PCP -Was given a refill of Robaxin today which has also been helpful -She is asking for refill of her meloxicam, but since she had recent ER visit for chest pain and has upcoming cardiology visit, recommended avoiding NSAIDs at this time.  She is breast understanding agreement

## 2023-06-10 NOTE — Progress Notes (Signed)
 DATE OF VISIT: 06/10/2023        Lyzette Reinhardt DOB: [birth year] MRN: [id number]  CC:  f/u knee pain  History of present Illness: Rita Bass is a 58 y.o. female who presents for a follow-up visit for b/l knee pain Last seen by [name] 01/07/23 - underwent b/l knee CSI with Dr Pearletha Forge at that time - injections only lasted for about a month - after injections she was in hospital with RSV and influenza  Today reports worsening knee pain Rt worse than the left No new injury/trauma Using Tylenol q 8hr prn with limited improvement Was using Mobic prn, but recent ER visit 04/24/23 for chest pain - scheduled to see Cardiology in a few weeks Also using Robaxin prn - would like a refill today Continues with Gabapentin and Tramadol as prescribed by PCP Prior CSI 10/03/22 with Dr Margaretha Sheffield -Only lasted 1 to 1.5 months No prior HA injections  A1c= 9.5 on 03/19/23  Medications:  Outpatient Encounter Medications as of 06/10/2023  Medication Sig   Accu-Chek Softclix Lancets lancets USE IN THE MORNING, AT NOON AND AT BEDTIME   acetaminophen (TYLENOL) 650 MG CR tablet Take 650-1,300 mg by mouth every 8 (eight) hours as needed for pain. Last dose (2) around 0930 am.   albuterol (PROVENTIL) (5 MG/ML) 0.5% nebulizer solution Take 0.5 mLs (2.5 mg total) by nebulization every 6 (six) hours as needed for wheezing or shortness of breath.   amLODipine (NORVASC) 2.5 MG tablet Take 1 tablet (2.5 mg total) by mouth daily.   aspirin 81 MG chewable tablet Chew 1 tablet (81 mg total) by mouth daily. (Patient not taking: Reported on 05/02/2023)   benzonatate (TESSALON PERLES) 100 MG capsule Take 1 capsule (100 mg total) by mouth every 6 (six) hours as needed for cough.   Blood Glucose Monitoring Suppl DEVI 1 each by Does not apply route in the morning, at noon, and at bedtime. May substitute to any manufacturer covered by patient's insurance.   cetirizine (ZYRTEC) 10 MG tablet Take 1 tablet (10 mg total) by  mouth daily. (Patient not taking: Reported on 11/20/2022)   doxycycline (VIBRA-TABS) 100 MG tablet Take 1 tablet (100 mg total) by mouth 2 (two) times daily.   DULoxetine (CYMBALTA) 20 MG capsule Take 1 capsule (20 mg total) by mouth daily.   empagliflozin (JARDIANCE) 25 MG TABS tablet Take 1 tablet (25 mg total) by mouth daily.   fluticasone (FLONASE) 50 MCG/ACT nasal spray Place 2 sprays into both nostrils daily. (Patient not taking: Reported on 11/20/2022)   gabapentin (NEURONTIN) 100 MG capsule TAKE 4 CAPSULES(400 MG) BY MOUTH THREE TIMES DAILY   glucose blood (ACCU-CHEK GUIDE) test strip 1 each by Other route daily. for testing   guaiFENesin (ROBITUSSIN) 100 MG/5ML liquid Take 5 mLs by mouth every 6 (six) hours as needed for cough or to loosen phlegm.   HYDROcodone bit-homatropine (HYCODAN) 5-1.5 MG/5ML syrup Take 5 mLs by mouth daily as needed for cough (Can use it once a day at night time to help with cough and sleep for the next 5 days. Do not take after 5 days).   ibuprofen (ADVIL) 800 MG tablet Take 1 tablet (800 mg total) by mouth 3 (three) times daily. For headache. (Patient not taking: Reported on 11/20/2022)   insulin glargine (LANTUS) 100 UNIT/ML Solostar Pen Inject 10-40 Units into the skin every morning.   Insulin Pen Needle (PEN NEEDLES) 32G X 5 MM MISC Use as directed with victoza  ipratropium-albuterol (DUONEB) 0.5-2.5 (3) MG/3ML SOLN Take 3 mLs by nebulization every 6 (six) hours as needed.   lidocaine (HM LIDOCAINE PATCH) 4 % Place 1 patch onto the skin daily. (Patient not taking: Reported on 05/02/2023)   metFORMIN (GLUCOPHAGE-XR) 500 MG 24 hr tablet Take 2 tablets (1,000 mg total) by mouth 2 (two) times daily.   methocarbamol (ROBAXIN) 500 MG tablet Take 1 tablet (500 mg total) by mouth every 8 (eight) hours as needed.   mometasone-formoterol (DULERA) 100-5 MCG/ACT AERO Inhale 2 puffs into the lungs daily.   nystatin (MYCOSTATIN/NYSTOP) powder Apply 1 Application topically 2 (two)  times daily. (Patient not taking: Reported on 04/03/2023)   olopatadine (PATANOL) 0.1 % ophthalmic solution Place 1 drop into both eyes 2 (two) times daily. (Patient not taking: Reported on 11/20/2022)   pantoprazole (PROTONIX) 40 MG tablet TAKE 1 TABLET(40 MG) BY MOUTH DAILY   Semaglutide, 2 MG/DOSE, (OZEMPIC, 2 MG/DOSE,) 8 MG/3ML SOPN Inject 2 mg into the skin once a week. (Patient not taking: Reported on 05/02/2023)   tamsulosin (FLOMAX) 0.4 MG CAPS capsule TAKE 1 CAPSULE(0.4 MG) BY MOUTH DAILY   traMADol (ULTRAM) 50 MG tablet Take 1 tablet (50 mg total) by mouth every 12 (twelve) hours as needed. Not to be refilled before 30 days   traMADol (ULTRAM) 50 MG tablet Take 1 tablet (50 mg total) by mouth every 12 (twelve) hours as needed.   [DISCONTINUED] methocarbamol (ROBAXIN) 500 MG tablet Take 1 tablet (500 mg total) by mouth every 8 (eight) hours as needed. (Patient taking differently: Take 500 mg by mouth every 8 (eight) hours as needed for muscle spasms.)   [DISCONTINUED] tamsulosin (FLOMAX) 0.4 MG CAPS capsule Take 1 capsule (0.4 mg total) by mouth daily.   No facility-administered encounter medications on file as of 06/10/2023.    Allergies: has no known allergies.  Physical Examination: Vitals: BP 136/84   Ht 5\' 4"  (1.626 m)   Wt (!) 323 lb (146.5 kg)   LMP 04/05/2016   BMI 55.44 kg/m  GENERAL:  Rita Bass is a 58 y.o. female appearing their stated age, alert and oriented x 3, in no apparent distress.  MSK: Bilateral knees without swelling or effusion.  She is tender palpation along the medial lateral joint lines of both knees.  She is walking with an antalgic gait. Neurovascular intact distally  Assessment & Plan Primary osteoarthritis of both knees Acute on chronic bilateral knee pain, right greater than left, with underlying osteoarthritis -Limited response to prior cortisone injection 01/07/2023 with Dr. Pearletha Forge, only lasted approximately a month -Patient would benefit  from consideration of knee replacement, but due to her obesity and BMI 55, she would need to lose weight before she would be a good candidate for surgery  Plan: -Previous visit notes from 01/07/2023 and 10/03/2022 in our office reviewed -Patient has had limited response to prior cortisone injections in July 2024 in October 2024, only lasting approximately 1 month.  Explained to patient that I do not recommend further cortisone injections at this time since she has not had adequate response.  We could potentially make her arthritis worse, but her diabetes is currently poorly controlled and future cortisone injections could exacerbate her blood sugars as well -Discussed possible hyaluronic acid injections versus referral for genicular nerve blocks with Dr. Huntley Estelle in our office.  She would like to investigate both options.  She is scheduled with Dr Huntley Estelle later this week Friday, 06/14/2023 to discuss possible geniculate nerve blocks. -Will also  submit authorization to insurance company for consideration of HA injections -She will continue her tramadol and gabapentin as prescribed by PCP -Was given a refill of Robaxin today which has also been helpful -She is asking for refill of her meloxicam, but since she had recent ER visit for chest pain and has upcoming cardiology visit, recommended avoiding NSAIDs at this time.  She is breast understanding agreement Type 2 diabetes mellitus with complication (HCC) A1c 9.5 on 03/19/2023, is following with PCP for ongoing management  Plan: -Patient inquiring about repeat cortisone injections today, but previous knee cortisone injections have only supplied approximately 1 to 1.5 months relief.  Based on this poor response and her poorly controlled diabetes, I do not recommend any further cortisone injections at this time. -She will continue to follow-up with her PCP for ongoing diabetes management BMI 50.0-59.9, adult (HCC) BMI = 55  Plan: -Patient with elevated BMI  and chronic bilateral knee pain with underlying osteoarthritis.  Patient would benefit from consideration of knee replacement, but due to her obesity, she would need to lose weight before she would be a good candidate for surgery -Will continue with conservative therapy at this time for her knee arthritis as noted above -She will continue work with PCP for ongoing weight loss  Patient expressed understanding & agreement with above.  Encounter Diagnoses  Name Primary?   Primary osteoarthritis of both knees Yes   Type 2 diabetes mellitus with complication (HCC)    BMI 50.0-59.9, adult (HCC)     No orders of the defined types were placed in this encounter.

## 2023-06-10 NOTE — Assessment & Plan Note (Signed)
 A1c 9.5 on 03/19/2023, is following with PCP for ongoing management  Plan: -Patient inquiring about repeat cortisone injections today, but previous knee cortisone injections have only supplied approximately 1 to 1.5 months relief.  Based on this poor response and her poorly controlled diabetes, I do not recommend any further cortisone injections at this time. -She will continue to follow-up with her PCP for ongoing diabetes management

## 2023-06-11 ENCOUNTER — Ambulatory Visit: Admitting: Family Medicine

## 2023-06-13 ENCOUNTER — Telehealth: Payer: Self-pay

## 2023-06-13 ENCOUNTER — Other Ambulatory Visit (HOSPITAL_COMMUNITY): Payer: Self-pay

## 2023-06-13 NOTE — Telephone Encounter (Signed)
 Patient calls nurse line regarding PA being required on Tramadol.   PA submitted via Covermymeds.   Key: Percell Miller, RN

## 2023-06-13 NOTE — Telephone Encounter (Signed)
 Received approval from insurance through 12/10/23.  Called pharmacy with approval.   Patient has already picked up 5 day supply. She will return to pharmacy when she is needing to fill the remainder of medication. Pharmacist states that she does not need a new prescription and they will fill the remaining 50 when patient is out of current rx.   Called patient and provided with update. Patient appreciative.   Veronda Prude, RN

## 2023-06-14 ENCOUNTER — Ambulatory Visit (HOSPITAL_COMMUNITY)
Admission: RE | Admit: 2023-06-14 | Discharge: 2023-06-14 | Disposition: A | Discharge status: Home / Self Care | Source: Ambulatory Visit | Attending: Family Medicine | Admitting: Family Medicine

## 2023-06-14 ENCOUNTER — Ambulatory Visit (INDEPENDENT_AMBULATORY_CARE_PROVIDER_SITE_OTHER): Admitting: Pain Medicine

## 2023-06-14 ENCOUNTER — Other Ambulatory Visit: Payer: Self-pay | Admitting: Family Medicine

## 2023-06-14 ENCOUNTER — Other Ambulatory Visit: Payer: Self-pay

## 2023-06-14 VITALS — BP 155/92 | Ht 64.0 in | Wt 322.0 lb

## 2023-06-14 DIAGNOSIS — R300 Dysuria: Secondary | ICD-10-CM | POA: Insufficient documentation

## 2023-06-14 DIAGNOSIS — M170 Bilateral primary osteoarthritis of knee: Secondary | ICD-10-CM

## 2023-06-14 NOTE — Progress Notes (Signed)
 DATE OF VISIT: 06/14/2023        Rita Bass DOB: 04-14-1965 MRN: [id number]  CC:  R>L knee pain  History- Rita Bass is a 58 y.o. female in clinic for evaluation and treatment of bilateral knee pain secondary to OA.  Pain complaint: Location: R>L knee Severity: 10/10 at worst Timing: constant Radiation: primarily isolated to knee, though c/o sole of foot numbness as well. Aggravating factors: walking, standing Alleviating factors: NSAIDs, robaxin Attribution: OA   Past Medical History Past Medical History:  Diagnosis Date   Arthritis    Asthma    Chronic pain of left knee    Complication of anesthesia    PONV   Diabetes (HCC)    GERD (gastroesophageal reflux disease)    H/O bronchitis    Hepatic steatosis    Internal hemorrhoids    Kidney stones    Obesity    Renal disorder    Shortness of breath dyspnea    Sleep apnea    past issues - at weight over 400lbs   Sleep apnea in adult    Polysomnogram pending.  Followed by Dr. Craige Cotta    Past Surgical History Past Surgical History:  Procedure Laterality Date   BIOPSY  11/01/2020   Procedure: BIOPSY;  Surgeon: Shellia Cleverly, DO;  Location: [facility];  Service: Gastroenterology;;   BIOPSY  05/11/2021   Procedure: BIOPSY;  Surgeon: Shellia Cleverly, DO;  Location: [facility];  Service: Gastroenterology;;   BIOPSY  11/29/2022   Procedure: BIOPSY;  Surgeon: Shellia Cleverly, DO;  Location: [facility];  Service: Gastroenterology;;   CESAREAN SECTION     6 c-sections   CHOLECYSTECTOMY     COLONOSCOPY WITH PROPOFOL N/A 05/11/2021   Procedure: COLONOSCOPY WITH PROPOFOL;  Surgeon: Shellia Cleverly, DO;  Location: [facility];  Service: Gastroenterology;  Laterality: N/A;   ESOPHAGOGASTRODUODENOSCOPY (EGD) WITH PROPOFOL N/A 11/01/2020   Procedure: ESOPHAGOGASTRODUODENOSCOPY (EGD) WITH PROPOFOL;  Surgeon: Shellia Cleverly, DO;  Location: [facility];  Service: Gastroenterology;  Laterality:  N/A;   ESOPHAGOGASTRODUODENOSCOPY (EGD) WITH PROPOFOL N/A 11/29/2022   Procedure: ESOPHAGOGASTRODUODENOSCOPY (EGD) WITH PROPOFOL;  Surgeon: Shellia Cleverly, DO;  Location: [facility];  Service: Gastroenterology;  Laterality: N/A;   HERNIA REPAIR     KIDNEY STONE SURGERY     OOPHORECTOMY     TRANSTHORACIC ECHOCARDIOGRAM  09/2017   Technically difficult study.  Did not use Definity contrast.  EF was 55 to 60% with moderate LVH and grade 1 diastolic dysfunction.  No significant valvular lesions noted.  No regional wall motion normality but difficult to assess due to poor imaging.    TUBAL LIGATION     UPPER GASTROINTESTINAL ENDOSCOPY      Medications Current Outpatient Medications  Medication Sig Dispense Refill   Accu-Chek Softclix Lancets lancets USE IN THE MORNING, AT NOON AND AT BEDTIME 100 each 12   acetaminophen (TYLENOL) 650 MG CR tablet Take 650-1,300 mg by mouth every 8 (eight) hours as needed for pain. Last dose (2) around 0930 am.     albuterol (PROVENTIL) (5 MG/ML) 0.5% nebulizer solution Take 0.5 mLs (2.5 mg total) by nebulization every 6 (six) hours as needed for wheezing or shortness of breath. 20 mL 0   amLODipine (NORVASC) 2.5 MG tablet Take 1 tablet (2.5 mg total) by mouth daily. 30 tablet 0   aspirin 81 MG chewable tablet Chew 1 tablet (81 mg total) by mouth daily. (Patient not taking: Reported on 05/02/2023) 30 tablet 0  benzonatate (TESSALON PERLES) 100 MG capsule Take 1 capsule (100 mg total) by mouth every 6 (six) hours as needed for cough. 30 capsule 0   Blood Glucose Monitoring Suppl DEVI 1 each by Does not apply route in the morning, at noon, and at bedtime. May substitute to any manufacturer covered by patient's insurance. 1 each 0   cetirizine (ZYRTEC) 10 MG tablet Take 1 tablet (10 mg total) by mouth daily. (Patient not taking: Reported on 11/20/2022) 30 tablet 11   doxycycline (VIBRA-TABS) 100 MG tablet Take 1 tablet (100 mg total) by mouth 2 (two) times daily. 20  tablet 0   DULoxetine (CYMBALTA) 20 MG capsule Take 1 capsule (20 mg total) by mouth daily. 90 capsule 3   empagliflozin (JARDIANCE) 25 MG TABS tablet Take 1 tablet (25 mg total) by mouth daily. 90 tablet 3   fluticasone (FLONASE) 50 MCG/ACT nasal spray Place 2 sprays into both nostrils daily. (Patient not taking: Reported on 11/20/2022) 16 g 6   gabapentin (NEURONTIN) 100 MG capsule TAKE 4 CAPSULES(400 MG) BY MOUTH THREE TIMES DAILY 360 capsule 2   glucose blood (ACCU-CHEK GUIDE) test strip 1 each by Other route daily. for testing 100 strip 1   guaiFENesin (ROBITUSSIN) 100 MG/5ML liquid Take 5 mLs by mouth every 6 (six) hours as needed for cough or to loosen phlegm. 120 mL 0   HYDROcodone bit-homatropine (HYCODAN) 5-1.5 MG/5ML syrup Take 5 mLs by mouth daily as needed for cough (Can use it once a day at night time to help with cough and sleep for the next 5 days. Do not take after 5 days). 30 mL 0   ibuprofen (ADVIL) 800 MG tablet Take 1 tablet (800 mg total) by mouth 3 (three) times daily. For headache. (Patient not taking: Reported on 11/20/2022) 15 tablet 0   insulin glargine (LANTUS) 100 UNIT/ML Solostar Pen Inject 10-40 Units into the skin every morning. 15 mL 11   Insulin Pen Needle (PEN NEEDLES) 32G X 5 MM MISC Use as directed with victoza 30 each 3   ipratropium-albuterol (DUONEB) 0.5-2.5 (3) MG/3ML SOLN Take 3 mLs by nebulization every 6 (six) hours as needed. 180 mL 0   lidocaine (HM LIDOCAINE PATCH) 4 % Place 1 patch onto the skin daily. (Patient not taking: Reported on 05/02/2023) 30 patch 0   metFORMIN (GLUCOPHAGE-XR) 500 MG 24 hr tablet Take 2 tablets (1,000 mg total) by mouth 2 (two) times daily. 360 tablet 3   methocarbamol (ROBAXIN) 500 MG tablet Take 1 tablet (500 mg total) by mouth every 8 (eight) hours as needed. 60 tablet 1   mometasone-formoterol (DULERA) 100-5 MCG/ACT AERO Inhale 2 puffs into the lungs daily. 39 g 3   nystatin (MYCOSTATIN/NYSTOP) powder Apply 1 Application  topically 2 (two) times daily. (Patient not taking: Reported on 04/03/2023) 60 g 2   olopatadine (PATANOL) 0.1 % ophthalmic solution Place 1 drop into both eyes 2 (two) times daily. (Patient not taking: Reported on 11/20/2022) 5 mL 12   pantoprazole (PROTONIX) 40 MG tablet TAKE 1 TABLET(40 MG) BY MOUTH DAILY 90 tablet 0   Semaglutide, 2 MG/DOSE, (OZEMPIC, 2 MG/DOSE,) 8 MG/3ML SOPN Inject 2 mg into the skin once a week. (Patient not taking: Reported on 05/02/2023) 3 mL 5   tamsulosin (FLOMAX) 0.4 MG CAPS capsule TAKE 1 CAPSULE(0.4 MG) BY MOUTH DAILY 30 capsule 1   traMADol (ULTRAM) 50 MG tablet Take 1 tablet (50 mg total) by mouth every 12 (twelve) hours as needed. Not to  be refilled before 30 days 60 tablet 0   traMADol (ULTRAM) 50 MG tablet Take 1 tablet (50 mg total) by mouth every 12 (twelve) hours as needed. 60 tablet 0   No current facility-administered medications for this visit.    Allergies has no known allergies.  Family History - reviewed per EMR and intake form  Social History   reports no history of alcohol use.  reports that she has never smoked. She has never been exposed to tobacco smoke. She has never used smokeless tobacco.  reports no history of drug use.  EXAM: Vitals: BP (!) 155/92   Ht 5\' 4"  (1.626 m)   Wt (!) 322 lb (146.1 kg)   LMP 04/05/2016   BMI 55.27 kg/m  General: AOx3, NAD, pleasant SKIN: no rashes or lesions, skin clean, dry, intact MSK: No erythema or skin lesions.  ROM reduced bilaterally in both extension and flexion.    NEURO: sensation intact to light touch  IMAGING: XRAYS: bilateral knee -  Tricompartmental degenerative changes   A1c= 9.5 on 03/19/23   ASSESSMENT:   ICD-10-CM   1. Primary osteoarthritis of both knees  M17.0        PLAN: 1. Genicular nerve blocks today bilaterally without steroid.  R/B/A explained to include pain, bleeding, infection (increased risk with DM diagnosis), potential for leg weakness, continued pain, and  temporary nature.  Patient agrees to proceed.   - Subsequent plan for RFA or chemoneurolysis if successful. 2. Consider PCP to increase cymbalta if no contraindications.  May be helpful for knee pain.  The patient will return to see me in 2 weeks, will call sooner with any questions/concerns.   Patient expressed understanding & agreement with above.

## 2023-06-14 NOTE — Progress Notes (Signed)
 PRE-OP DIAGNOSIS: bilateral knee osteoarthritis PROCEDURE: bilateral knee genicular nerve blocks under US guidance Performing Physician: Celso Sickle, MD   Total Dose:       bupivacaine 0.5%      9  mL     R/B/A of procedure provided.  Risks of infection, bleeding, damage to other structures and nerves explained.    Procedure: The area was prepped in the usual sterile manner with chlorhexidine. Sterile ultrasound gel was applied.  Ultrasound was used to identify the femoral shaft and condyles, along with the tibia shaft and head on the right side.  The inflection points were noted at the areas of the superior medial, superior lateral, and inferomedial genicular nerves.  A 25g 3.5 inch Quinky spinal needle was inserted through the skin and to the area of the nerves.  After negative aspiration, 1.5 ml of the above solution was injected at each of the 3 sites above.   There were no complications during this procedure.  Procedure was repeated on the contralateral side.   Patient reported relief upon standing after the procedure.   Followup: The patient tolerated the procedure well without complications.  Standard post-procedure care is explained and return precautions are given.

## 2023-06-18 ENCOUNTER — Ambulatory Visit: Admitting: Family Medicine

## 2023-06-18 ENCOUNTER — Encounter: Payer: Self-pay | Admitting: Family Medicine

## 2023-06-18 VITALS — BP 144/74 | HR 82 | Ht 64.0 in | Wt 322.4 lb

## 2023-06-18 DIAGNOSIS — E118 Type 2 diabetes mellitus with unspecified complications: Secondary | ICD-10-CM

## 2023-06-18 DIAGNOSIS — G479 Sleep disorder, unspecified: Secondary | ICD-10-CM

## 2023-06-18 DIAGNOSIS — N200 Calculus of kidney: Secondary | ICD-10-CM

## 2023-06-18 LAB — POCT GLYCOSYLATED HEMOGLOBIN (HGB A1C): HbA1c, POC (controlled diabetic range): 10.4 % — AB (ref 0.0–7.0)

## 2023-06-18 MED ORDER — DULOXETINE HCL 30 MG PO CPEP
30.0000 mg | ORAL_CAPSULE | Freq: Every day | ORAL | 3 refills | Status: DC
Start: 2023-06-18 — End: 2023-07-16

## 2023-06-18 NOTE — Patient Instructions (Addendum)
 Good to see you today - Thank you for coming in  Things we discussed today:  1) For your sleep and stress issues, - We will increase your duloxetine to 30mg  once a day. - Go to [email or web address] to find a therapist that does cognitive behavioral therapy.  2) Take melatonin 5mg  1 hour before bedtime (this is a little over-the-counter).  3) Your A1c and blood pressure are high. Make an appointment with Dr. Raymondo Band in 1-2 weeks to discuss this.   4) Your ultrasound showed that your kidney stone is gone. You can stop taking flomax.   Come back to see me in about 4 weeks to discuss your sleep and mood.

## 2023-06-18 NOTE — Progress Notes (Signed)
    SUBJECTIVE:   CHIEF COMPLAINT / HPI:   DM is a 58yo F w/ hx of T2DM, OA of knees, chronic pain that p/f sleep disturbances.  Sleep Disturbances  Life Stressors - her son moved in with her in Feb. He has schizophrenia, recently moved in with family since being released from a facility. - Reports she's having a lot of life stress. It is affecting her sleep, she is not sleeping anymore. She is constantly worried about him. - Reports having difficulty getting her son to take his meds - Also worried that her son is suicidal. He has tried to drown himself at home and she is worried that she will wake up and find him dead.   BL Knee OA - Got nerve blocker with sports medicine for both knees, done last week.   OBJECTIVE:   BP (!) 144/74   Pulse 82   Ht 5\' 4"  (1.626 m)   Wt (!) 322 lb 6.4 oz (146.2 kg)   LMP 04/05/2016   SpO2 97%   BMI 55.34 kg/m   General: Alert, pleasant woman. NAD. HEENT: NCAT. MMM. CV: RRR, no murmurs.  Resp: CTAB, no wheezing or crackles. Normal WOB on RA.  Abm: Soft, nondistended. BS present. Ext: Moves all ext spontaneously Skin: Warm, well perfused   ASSESSMENT/PLAN:   Assessment & Plan Type 2 diabetes mellitus with complication (HCC) Uncontrolled, A1c 10.4.  - Advised to f/u with Dr. Raymondo Band in 2 weeks for further management of T2DM Sleep disturbance Most likely from recent life stressors/caregiver burnout causing anxiousness. - Advised to find therapist on [employer] - Increase duloxetine to 30mg  daily - f/u with me in 4 weeks to f/u mood. Check in if pt has therapist. Consider connecting pt with caregiver support resources. Renal stone Discussed renal US results (negative for stone). Can stop flomax.   Lincoln Brigham, MD [facility]

## 2023-06-20 DIAGNOSIS — G479 Sleep disorder, unspecified: Secondary | ICD-10-CM | POA: Insufficient documentation

## 2023-06-20 NOTE — Assessment & Plan Note (Signed)
 Uncontrolled, A1c 10.4.  - Advised to f/u with Dr. Raymondo Band in 2 weeks for further management of T2DM

## 2023-06-20 NOTE — Assessment & Plan Note (Signed)
 Most likely from recent life stressors/caregiver burnout causing anxiousness. - Advised to find therapist on [employer] - Increase duloxetine to 30mg  daily - f/u with me in 4 weeks to f/u mood. Check in if pt has therapist. Consider connecting pt with caregiver support resources.

## 2023-06-22 DIAGNOSIS — G4733 Obstructive sleep apnea (adult) (pediatric): Secondary | ICD-10-CM | POA: Diagnosis not present

## 2023-06-27 ENCOUNTER — Ambulatory Visit

## 2023-06-27 ENCOUNTER — Ambulatory Visit: Payer: Medicaid Other | Attending: Internal Medicine | Admitting: Internal Medicine

## 2023-06-27 VITALS — BP 142/70 | HR 80 | Ht 64.0 in | Wt 321.0 lb

## 2023-06-27 DIAGNOSIS — R002 Palpitations: Secondary | ICD-10-CM | POA: Insufficient documentation

## 2023-06-27 DIAGNOSIS — I2510 Atherosclerotic heart disease of native coronary artery without angina pectoris: Secondary | ICD-10-CM | POA: Diagnosis not present

## 2023-06-27 DIAGNOSIS — I10 Essential (primary) hypertension: Secondary | ICD-10-CM

## 2023-06-27 MED ORDER — SPIRONOLACTONE 25 MG PO TABS: 25.0000 mg | ORAL_TABLET | Freq: Every day | ORAL | 3 refills | Status: AC

## 2023-06-27 NOTE — Progress Notes (Signed)
 Cardiology Office Note:  .    Date:  06/27/2023  ID:  Rita Bass, DOB 19-Apr-1965, MRN [id number] PCP: Rita Brigham, MD  Rita Bass Cardiologist:  Rita Lemma, MD     CC: DOD: Chest pain/Tachycardia  History of Present Illness: .    Rita Bass is a 58 y.o. female  with diastolic heart failure and type two diabetes who presents with atypical chest pain. She is accompanied by her daughter. Patient of Dr. Elissa Bass; she saw Dr. Diona Bass earlier this year after a prolonged RSV infection.  She experiences atypical chest pain described as severe, accompanied by dizziness and blackouts. During an episode at her doctor's office, she had severe chest pain and dizziness, leading to a blackout, with her heart rate rising to 155 beats per minute. She was placed on oxygen. She also notes a 'real bad thumping' in her neck, indicating a rapid pulse, especially during physical activity like walking in stores. Despite multiple EKGs since January 2025, no tachyarrhythmia has been documented. She has a history of LAD calcifications found on a CTPE in 2014.  She has a history of diastolic heart failure and morbid obesity with a BMI of 50. She also has type two diabetes and diabetic nephropathy. She underwent a large hernia surgery due to bowel and colon blockage, which required an eight-hour procedure. Prior to this surgery, she had a heart test to ensure she could withstand the procedure.  She experiences swelling in her legs, which she has reported to her doctor. She also has knee problems and receives injections for pain management. Recently, she underwent nerve blocker injections as an alternative to cortisone due to its impact on her blood sugar levels.  In January 2025, she was hospitalized for nine days due to RSV, during which she experienced episodes of elevated heart rate and chest pain. An ultrasound was performed during this hospitalization.   Relevant  histories: .  Social- comes with daughter ROS: As per HPI.   Studies Reviewed: .   Cardiac Studies & Procedures   ______________________________________________________________________________________________     ECHOCARDIOGRAM  ECHOCARDIOGRAM COMPLETE 03/24/2023  Narrative ECHOCARDIOGRAM REPORT    Patient Name:   Rita Bass [facility] Date of Exam: 03/24/2023 Medical Rec #:  [id number]             Height:       64.0 in Accession #:    [id number]            Weight:       326.0 lb Date of Birth:  [birth year]             BSA:          2.408 m Patient Age:    57 years              BP:           124/82 mmHg Patient Gender: F                     HR:           82 bpm. Exam Location:  Inpatient  Procedure: 2D Echo, Cardiac Doppler, Color Doppler and Intracardiac Opacification Agent  Indications:    CP  History:        Patient has prior history of Echocardiogram examinations, most recent 09/18/2017. Signs/Symptoms:Dyspnea and Shortness of Breath; Risk Factors:Sleep Apnea, Diabetes, Hypertension, Dyslipidemia and Non-Smoker.  Sonographer:    Rita Bass Referring Phys: [id number] Rita Bass  IMPRESSIONS   1. Images are very limited. 2. Left ventricular ejection fraction, by estimation, is 55 to 60%. The left ventricle has normal function. Left ventricular endocardial border not optimally defined to evaluate regional wall motion. Left ventricular diastolic parameters are consistent with Grade I diastolic dysfunction (impaired relaxation). 3. Definity contrast does not demonstrate any obvious wall motion abnormalities. 4. Right ventricular systolic function was not well visualized. The right ventricular size is not well visualized. Tricuspid regurgitation signal is inadequate for assessing PA pressure. 5. The mitral valve was not well visualized. Trivial mitral valve regurgitation. 6. The aortic valve was not well visualized. Aortic valve regurgitation is not visualized.  Aortic valve mean gradient measures 3.0 mmHg. 7. Unable to estimate CVP.  Comparison(s): Prior images unable to be directly viewed.  FINDINGS Left Ventricle: Left ventricular ejection fraction, by estimation, is 55 to 60%. The left ventricle has normal function. Left ventricular endocardial border not optimally defined to evaluate regional wall motion. The left ventricular internal cavity size was normal in size. Suboptimal image quality limits for assessment of left ventricular hypertrophy. Left ventricular diastolic parameters are consistent with Grade I diastolic dysfunction (impaired relaxation).  Right Ventricle: The right ventricular size is not well visualized. Right vetricular wall thickness was not well visualized. Right ventricular systolic function was not well visualized. Tricuspid regurgitation signal is inadequate for assessing PA pressure.  Left Atrium: Left atrial size was normal in size.  Right Atrium: Right atrial size was normal in size.  Pericardium: The pericardium was not well visualized.  Mitral Valve: The mitral valve was not well visualized. Trivial mitral valve regurgitation. MV peak gradient, 2.9 mmHg. The mean mitral valve gradient is 1.0 mmHg.  Tricuspid Valve: The tricuspid valve is not well visualized. Tricuspid valve regurgitation is trivial.  Aortic Valve: The aortic valve was not well visualized. Aortic valve regurgitation is not visualized. Aortic valve mean gradient measures 3.0 mmHg. Aortic valve peak gradient measures 6.1 mmHg. Aortic valve area, by VTI measures 3.24 cm.  Pulmonic Valve: The pulmonic valve was not well visualized. Pulmonic valve regurgitation is not visualized.  Aorta: The aortic root and ascending aorta are structurally normal, with no evidence of dilitation.  Venous: Unable to estimate CVP. The inferior vena cava was not well visualized.  IAS/Shunts: The interatrial septum was not well visualized.   LEFT VENTRICLE PLAX  2D LVIDd:         3.90 cm     Diastology LVIDs:         2.70 cm     LV e' medial:    7.72 cm/s LV PW:         0.90 cm     LV E/e' medial:  7.6 LV IVS:        0.80 cm     LV e' lateral:   8.92 cm/s LVOT diam:     1.90 cm     LV E/e' lateral: 6.6 LV SV:         68 LV SV Index:   28 LVOT Area:     2.84 cm  LV Volumes (MOD) LV vol d, MOD A2C: 93.9 ml LV vol d, MOD A4C: 85.7 ml LV vol s, MOD A2C: 43.9 ml LV vol s, MOD A4C: 40.7 ml LV SV MOD A2C:     50.0 ml LV SV MOD A4C:     85.7 ml LV SV MOD BP:      49.5 ml  RIGHT VENTRICLE RV S prime:  12.10 cm/s TAPSE (M-mode): 2.0 cm  LEFT ATRIUM           Index LA diam:      3.40 cm 1.41 cm/m LA Vol (A2C): 46.9 ml 19.48 ml/m LA Vol (A4C): 21.6 ml 8.97 ml/m AORTIC VALVE                    PULMONIC VALVE AV Area (Vmax):    2.44 cm     PV Vmax:       1.25 m/s AV Area (Vmean):   2.34 cm     PV Peak grad:  6.3 mmHg AV Area (VTI):     3.24 cm AV Vmax:           123.00 cm/s AV Vmean:          87.300 cm/s AV VTI:            0.209 m AV Peak Grad:      6.1 mmHg AV Mean Grad:      3.0 mmHg LVOT Vmax:         106.00 cm/s LVOT Vmean:        71.900 cm/s LVOT VTI:          0.239 m LVOT/AV VTI ratio: 1.14  AORTA Ao Root diam: 3.00 cm Ao Asc diam:  3.00 cm  MITRAL VALVE MV Area (PHT): 3.08 cm    SHUNTS MV Area VTI:   3.95 cm    Systemic VTI:  0.24 m MV Peak grad:  2.9 mmHg    Systemic Diam: 1.90 cm MV Mean grad:  1.0 mmHg MV Vmax:       0.84 m/s MV Vmean:      55.1 cm/s MV Decel Time: 246 msec MV E velocity: 58.70 cm/s MV A velocity: 72.80 cm/s MV E/A ratio:  0.81  Nona Dell MD Electronically signed by Nona Dell MD Signature Date/Time: 03/24/2023/2:36:11 PM    Final          ______________________________________________________________________________________________        Physical Exam:    VS:  BP (!) 142/70 (BP Location: Right Arm)   Pulse 80   Ht 5\' 4"  (1.626 m)   Wt (!) 145.6 kg   LMP  04/05/2016   SpO2 95%   BMI 55.10 kg/m    Wt Readings from Last 3 Encounters:  06/27/23 (!) 145.6 kg  06/18/23 (!) 146.2 kg  06/14/23 (!) 146.1 kg    Gen: no distress, super morbid obesity   Neck: No JVD Cardiac: No Rubs or Gallops, no murmur, distant heart sounds, RRR +2 radial pulses Respiratory: Clear to auscultation bilaterally, normal effort, normal  respiratory rate GI: Soft, nontender, non-distended  MS: No  edema;  moves all extremities Integument: Skin feels warm Neuro:  At time of evaluation, alert and oriented to person/place/time/situation  Psych: Normal affect, patient feels well   ASSESSMENT AND PLAN: .    Atypical Chest Pain Presents with atypical chest pain, tachycardia, and dizziness. LAD calcifications noted on previous CTPE. Differential includes obstructive coronary artery disease and supraventricular tachycardia. Plaque buildup does not necessarily indicate obstruction; further testing is required to assess significance. - reviewed her CT from 2024 with her and her daughter - Order PET MPI to evaluate for obstructive coronary artery disease - Order 2-week non-live Xio patch to monitor for supraventricular tachycardia  Coronary Artery Disease LAD calcifications suggest coronary artery disease. The significance of the blockage is unclear; further evaluation is needed to determine symptom  causation. Surgery is not typically required for such blockages. Improving blood pressure can enhance arterial health. - Order PET MPI to assess for significant obstruction in coronary arteries  Tachycardia Experiences episodes of tachycardia, especially with activity, but no documented tachyarrhythmia. Differential includes sinus tachycardia due to deconditioning or supraventricular tachycardia. Sinus tachycardia is common post-illness, but supraventricular tachycardia requires different treatment. - Order 2-week non-live Xio patch to monitor heart rate and rule out  supraventricular tachycardia  Hypertension with Lower Extremity Edema Super morbid obesity Elevated blood pressure and significant lower extremity edema. Plan to manage fluid retention and improve blood pressure, potentially alleviating cardiac symptoms. Initiating medication to reduce fluid and enhance blood pressure control could benefit arterial health. - Prescribe spironolactone 25 mg PO daily - Order BMP in 2 weeks to monitor response to spironolactone - continue current therapy  Follow-up - Schedule follow-up with one of Doctor Harding's APP teammates in 3 months  Riley Lam, MD FASE [facility] Cardiologist [facility]  [address] [place], [address] [place], [place] [address] [phone]  3:34 PM

## 2023-06-27 NOTE — Patient Instructions (Addendum)
 Medication Instructions:  Your physician has recommended you make the following change in your medication:   1) START spironolactone 25 mg daily  *If you need a refill on your cardiac medications before your next appointment, please call your pharmacy*  Lab Work: In 2 weeks at [employer]: [employer] may go to any of these [facility] locations:   [facility] - [address] ([facility]) - [address] [phone] [address]   If you have labs (blood work) drawn today and your tests are completely normal, you will receive your results only by: [employer] (if you have MyChart) OR A paper copy in the mail If you have any lab test that is abnormal or we need to change your treatment, we will call you to review the results.  Testing/Procedures: Your physician has requested that you have a cardiac PET CT stress test.  Your physician has requested that you wear a Zio heart monitor for 14 days. This will be mailed to your home with instructions on how to apply the monitor and how to return it when finished. Please allow 2 weeks after returning the heart monitor before our office calls you with the results.   Follow-Up: At [facility], you and your health needs are our priority.  As part of our continuing mission to provide you with exceptional heart care, we have created designated Provider Care Teams.  These Care Teams include your primary Cardiologist (physician) and Advanced Practice Providers (APPs -  Physician Assistants and Nurse Practitioners) who all work together to provide you with the care you need, when you need it.  Your next appointment:   3 month(s)  The format for your next appointment:   In Person  Provider:   Joni Reining, DNP, ANP, Bernadene Person, NP, or Rise Paganini, NP      Other Instructions    Please report to Radiology at the [facility] Main Entrance 30 minutes early for your test.  [address] [place], [place] [address]                         OR   Please report to Radiology at [facility] Main Entrance, medical mall, 30 mins prior to your test.  [address]  [place], [place]  How to Prepare for Your Cardiac PET/CT Stress Test:  Nothing to eat or drink, except water, 3 hours prior to arrival time.  NO caffeine/decaffeinated products, or chocolate 12 hours prior to arrival. (Please note decaffeinated beverages (teas/coffees) still contain caffeine).  If you have caffeine within 12 hours prior, the test will need to be rescheduled.  Medication instructions: Do not take erectile dysfunction medications for 72 hours prior to test (sildenafil, tadalafil) Do not take nitrates (isosorbide mononitrate, Ranexa) the day before or day of test Do not take tamsulosin the day before or morning of test Hold theophylline containing medications for 12 hours. Hold Dipyridamole 48 hours prior to the test.  Diabetic Preparation: If able to eat breakfast prior to 3 hour fasting, you may take all medications, including your insulin. Do not worry if you miss your breakfast dose of insulin - start at your next meal. If you do not eat prior to 3 hour fast-Hold all diabetes (oral and insulin) medications. Patients who wear a continuous glucose monitor MUST remove the device prior to scanning.  You may take your remaining  medications with water.  NO perfume, cologne or lotion on chest or abdomen area. FEMALES - Please avoid wearing dresses to this appointment.  Total time is 1 to 2 hours; you may want to bring reading material for the waiting time.  IF YOU THINK YOU MAY BE PREGNANT, OR ARE NURSING PLEASE INFORM THE TECHNOLOGIST.  In preparation for your appointment, medication and supplies will be purchased.  Appointment availability is limited, so if you need to cancel or reschedule, please call the Radiology Department Scheduler at [phone] 24 hours in  advance to avoid a cancellation fee of $100.00  What to Expect When you Arrive:  Once you arrive and check in for your appointment, you will be taken to a preparation room within the Radiology Department.  A technologist or Nurse will obtain your medical history, verify that you are correctly prepped for the exam, and explain the procedure.  Afterwards, an IV will be started in your arm and electrodes will be placed on your skin for EKG monitoring during the stress portion of the exam. Then you will be escorted to the PET/CT scanner.  There, staff will get you positioned on the scanner and obtain a blood pressure and EKG.  During the exam, you will continue to be connected to the EKG and blood pressure machines.  A small, safe amount of a radioactive tracer will be injected in your IV to obtain a series of pictures of your heart along with an injection of a stress agent.    After your Exam:  It is recommended that you eat a meal and drink a caffeinated beverage to counter act any effects of the stress agent.  Drink plenty of fluids for the remainder of the day and urinate frequently for the first couple of hours after the exam.  Your doctor will inform you of your test results within 7-10 business days.  For more information and frequently asked questions, please visit our website: [email or web address]  For questions about your test or how to prepare for your test, please call: Cardiac Imaging Nurse Navigators Office: [phone]      ZIO XT- Long Term Monitor Instructions     Your physician has requested you wear a ZIO patch monitor for 14 days.  This is a single patch monitor. [employer] supplies one patch monitor per enrollment. Additional  stickers are not available. Please do not apply patch if you will be having a Nuclear Stress Test,  Echocardiogram, Cardiac CT, MRI, or Chest Xray during the period you would be wearing the  monitor. The patch cannot be worn during these  tests. You cannot remove and re-apply the  ZIO XT patch monitor.  Your ZIO patch monitor will be mailed 3 day USPS to your address on file. It may take 3-5 days  to receive your monitor after you have been enrolled.  Once you have received your monitor, please review the enclosed instructions. Your monitor  has already been registered assigning a specific monitor serial # to you.     Billing and Patient Assistance Program Information     We have supplied [employer] with any of your insurance information on file for billing purposes.  [employer] offers a sliding scale Patient Assistance Program for patients that do not have  insurance, or whose insurance does not completely cover the cost of the ZIO monitor.  You must apply for the Patient Assistance Program to qualify for this discounted rate.  To apply, please call [employer] at [phone], select option 4,  select option 2, ask to apply for  Patient Assistance Program. Meredeth Ide will ask your household income, and how many people  are in your household. They will quote your out-of-pocket cost based on that information.  [employer] will also be able to set up a 31-month, interest-free payment plan if needed.     Applying the monitor     Shave hair from upper left chest.  Hold abrader disc by orange tab. Rub abrader in 40 strokes over the upper left chest as  indicated in your monitor instructions.  Clean area with 4 enclosed alcohol pads. Let dry.  Apply patch as indicated in monitor instructions. Patch will be placed under collarbone on left  side of chest with arrow pointing upward.  Rub patch adhesive wings for 2 minutes. Remove white label marked "1". Remove the white  label marked "2". Rub patch adhesive wings for 2 additional minutes.  While looking in a mirror, press and release button in center of patch. A small green light will  flash 3-4 times. This will be your only indicator that the monitor has been turned on.  Do not shower for the  first 24 hours. You may shower after the first 24 hours.  Press the button if you feel a symptom. You will hear a small click. Record Date, Time and  Symptom in the Patient Logbook.  When you are ready to remove the patch, follow instructions on the last 2 pages of Patient  Logbook. Stick patch monitor onto the last page of Patient Logbook.  Place Patient Logbook in the blue and white box. Use locking tab on box and tape box closed  securely. The blue and white box has prepaid postage on it. Please place it in the mailbox as  soon as possible. Your physician should have your test results approximately 7 days after the  monitor has been mailed back to [facility].  Call [facility] Customer Care at [phone] if you have questions regarding  your ZIO XT patch monitor. Call them immediately if you see an orange light blinking on your  monitor.  If your monitor falls off in less than 4 days, contact our Monitor department at [phone].  If your monitor becomes loose or falls off after 4 days call [employer] at [phone] for  suggestions on securing your monitor.         1st Floor: - Lobby - Registration  - Pharmacy  - Lab - Cafe  2nd Floor: - PV Lab - Diagnostic Testing (echo, CT, nuclear med)  3rd Floor: - Vacant  4th Floor: - TCTS (cardiothoracic surgery) - AFib Clinic - Structural Heart Clinic - Vascular Surgery  - Vascular Ultrasound  5th Floor: - HeartCare Cardiology (general and EP) - Clinical Pharmacy for coumadin, hypertension, lipid, weight-loss medications, and med management appointments    Valet parking services will be available as well.

## 2023-06-27 NOTE — Progress Notes (Unsigned)
 Enrolled for [employer] to mail a ZIO XT long term holter monitor to the patients address on file.

## 2023-07-01 ENCOUNTER — Encounter: Payer: Self-pay | Admitting: Pharmacist

## 2023-07-01 ENCOUNTER — Ambulatory Visit: Admitting: Student

## 2023-07-01 ENCOUNTER — Ambulatory Visit: Admitting: Pharmacist

## 2023-07-01 VITALS — BP 130/87 | HR 79 | Wt 318.2 lb

## 2023-07-01 DIAGNOSIS — I10 Essential (primary) hypertension: Secondary | ICD-10-CM | POA: Diagnosis not present

## 2023-07-01 DIAGNOSIS — E1140 Type 2 diabetes mellitus with diabetic neuropathy, unspecified: Secondary | ICD-10-CM | POA: Diagnosis not present

## 2023-07-01 DIAGNOSIS — E118 Type 2 diabetes mellitus with unspecified complications: Secondary | ICD-10-CM

## 2023-07-01 DIAGNOSIS — Z7985 Long-term (current) use of injectable non-insulin antidiabetic drugs: Secondary | ICD-10-CM | POA: Diagnosis not present

## 2023-07-01 DIAGNOSIS — R002 Palpitations: Secondary | ICD-10-CM | POA: Diagnosis not present

## 2023-07-01 MED ORDER — INSULIN GLARGINE 100 UNIT/ML SOLOSTAR PEN: 20.0000 [IU] | PEN_INJECTOR | SUBCUTANEOUS | 11 refills | Status: DC

## 2023-07-01 MED ORDER — FREESTYLE LIBRE 3 PLUS SENSOR MISC: 11 refills | Status: AC

## 2023-07-01 MED ORDER — FREESTYLE LIBRE 3 READER DEVI: 1.0000 | Freq: Once | 0 refills | Status: AC

## 2023-07-01 NOTE — Progress Notes (Signed)
    SUBJECTIVE:   CHIEF COMPLAINT / HPI:   Patient presents for assistance with placing Zio patch monitor.  Zio patch ordered by cardiology for palpitations.  OBJECTIVE:   LMP 04/05/2016  Please see separate pharmacy note for vitals.  General: NAD, pleasant Cardio: RRR, no MRG. Cap Refill <2s. Respiratory: CTAB, normal wob on RA Skin: Warm and dry  ASSESSMENT/PLAN:   Assessment & Plan Palpitations Patient presents with Zio patch.  Requesting assistance with application.  Zio patch ordered by cardiology. - Zio patch placed today - Reviewed instructions - Follow-up with cardiology    Lavada Porteous, DO  [facility]

## 2023-07-01 NOTE — Assessment & Plan Note (Signed)
 Hypertension longstanding currently controlled at 130/87, although has not started spironolactone yet. Blood pressure goal of <130/80 mmHg.  -Encourages to start spironolactone 25 mg.

## 2023-07-01 NOTE — Assessment & Plan Note (Signed)
 Diabetes longstanding currently uncontrolled with A1c of 10.4%. Patient is able to verbalize appropriate hypoglycemia management plan. Medication adherence appears to be good. Control is suboptimal due to mixture of life stressors and diet. -Increased dose of basal insulin Lantus (insulin glargine) 10 units to 20 units daily in the morning.  -Continued Ozempic (semaglutide) 2 mg .  -Continued Jardiance (empagliflozin) 25 mg. Counseled on sick day rules. -Continued metformin 500 mg 1 tabs BID. -Sent in prescription for [facility] 3+ sensors and receiver. Instructed her to bring to next visit to go over application. -Extensively discussed pathophysiology of diabetes, recommended lifestyle interventions, dietary effects on blood sugar control.

## 2023-07-01 NOTE — Patient Instructions (Addendum)
 It was great to see you! Thank you for allowing me to participate in your care!    Our plans for today:  For the first 24 hours: Do not swim or take a shower/bath, avoid activities that make you sweat, do not submerge in water, do not apply soap or lotions near your Zio patch monitor.  After 24 hours: You can take brief showers with your back to the water, and do light exercise.  Please do not submerge the monitor in water (pool, hot tub, bath) and do not apply soap or lotions near Zio patch monitor.  You may take off your Zio patch on 07/15/2023, and follow instructions on how to return the monitor.  Please follow-up with your cardiologist.  Take care and seek immediate care sooner if you develop any concerns. Please remember to show up 15 minutes before your scheduled appointment time!  Lavada Porteous, DO [facility]

## 2023-07-01 NOTE — Patient Instructions (Addendum)
 It was nice to see you today!  Your goal blood sugar is 80-130 before eating and less than 180 after eating.  Medication Changes: START spironolactone 25 mg  Increase Lantus (insulin glargine) to 20 units daily  Pick up Libre 3+ sensors and reader at the pharmacy  Continue all other medication the same.  Monitor blood sugars at home and keep a log (glucometer or piece of paper) to bring with you to your next visit.  Keep up the good work with diet and exercise. Aim for a diet full of vegetables, fruit and lean meats (chicken, [place], fish). Try to limit salt intake by eating fresh or frozen vegetables (instead of canned), rinse canned vegetables prior to cooking and do not add any additional salt to meals.

## 2023-07-01 NOTE — Progress Notes (Signed)
 S:     Chief Complaint  Patient presents with   Medication Management    T2DM Management   58 y.o. female who presents for diabetes evaluation, education, and management. Patient arrives in good spirits and presents without any assistance. Patient is accompanied by her daughter [name].  She brings in ZIO patch heart monitor for application.   Patient was referred and last seen by Primary Care Provider, Dr. Sherrilee Gilles, on 06/18/2023.   PMH is significant for HTN, T2DM, CAD, obesity, nephropathy, chronic knee pain, H. pylori.  At last visit, duloxetine was increased to 30 mg daily and discontinued tamsulosin.   Patient has reported recent life increased life stressors from son with schizophrenia moving in with her after being released from mental health facility.   Patient reports Diabetes was diagnosed in 2019.   Current diabetes medications include: insulin glargine 10 units daily, empagliflozin 25 mg, metformin 500 mg 2 tabs BID, Ozempic 2 mg Current hypertension medications include: spironolactone 25 mg - has not started yet Willing to start today.   Patient reports adherence to taking all medications as prescribed.   Do you feel that your medications are working for you? no Have you been experiencing any side effects to the medications prescribed? no Do you have any problems obtaining medications due to transportation or finances? no Insurance coverage: [place] [employer]  Patient denies hypoglycemic events.  Patient reports nocturia (nighttime urination).  Patient reports neuropathy (nerve pain). Patient denies visual changes. Patient denies self foot exams.   O:   Lab Results  Component Value Date   HGBA1C 10.4 (A) 06/18/2023   Vitals:   07/01/23 1057  BP: 130/87  Pulse: 79  SpO2: 97%   Lower extremity edema - 3+ with sock indentation.  Patient reports this level of swelling is consistent over the last several weeks.   Lipid Panel     Component Value  Date/Time   CHOL 192 06/21/2020 1547   TRIG 131 06/21/2020 1547   HDL 54 06/21/2020 1547   CHOLHDL 3.6 06/21/2020 1547   LDLCALC 115 (H) 06/21/2020 1547   LDLDIRECT 117 (H) 02/28/2021 0917    Clinical Atherosclerotic Cardiovascular Disease (ASCVD): Yes  The ASCVD Risk score (Arnett DK, et al., 2019) failed to calculate for the following reasons:   Cannot find a previous HDL lab   Cannot find a previous total cholesterol lab   Patient is participating in a Managed Medicaid Plan:  Yes   A/P: Diabetes longstanding currently uncontrolled with A1c of 10.4%. Patient is able to verbalize appropriate hypoglycemia management plan. Medication adherence appears to be good. Control is suboptimal due to mixture of life stressors and diet. -Increased dose of basal insulin Lantus (insulin glargine) 10 units to 20 units daily in the morning.  -Continued Ozempic (semaglutide) 2 mg .  -Continued Jardiance (empagliflozin) 25 mg. Counseled on sick day rules. -Continued metformin 500 mg 1 tabs BID. -Sent in prescription for [facility] 3+ sensors and receiver. Instructed her to bring to next visit to go over application. -Extensively discussed pathophysiology of diabetes, recommended lifestyle interventions, dietary effects on blood sugar control.  -Counseled on s/sx of and management of hypoglycemia.  -Next A1c anticipated July 2025.   ASCVD risk - primary prevention in patient with diabetes. Last LDL is 115 not at goal of <86 mg/dL. ASCVD risk factors include T2DM, HLD and HTN and 10-year ASCVD risk score of unknown. moderate intensity statin indicated.  - Discuss at future visit.  Previously  has taken Atorvastatin 40mg  in 2022.   Hypertension longstanding currently controlled at 130/87, although has not started spironolactone yet. Blood pressure goal of <130/80 mmHg.  -Encourages to start spironolactone 25 mg.   Dr. Telford Feather placed patient's ZIO patch heart monitor during visit, provided counseling and  answered questions.  Written patient instructions provided. Patient verbalized understanding of treatment plan.  Total time in face to face counseling 37 minutes.    Follow-up:  Pharmacist two weeks PCP clinic visit in two weeks  Patient seen with Teofilo Fellers, PharmD Candidate and Jeanine Millers, PharmD Candidate.

## 2023-07-01 NOTE — Progress Notes (Signed)
 Reviewed and agree with Dr Macky Lower plan.

## 2023-07-01 NOTE — Assessment & Plan Note (Signed)
 Patient presents with Zio patch.  Requesting assistance with application.  Zio patch ordered by cardiology. - Zio patch placed today - Reviewed instructions - Follow-up with cardiology

## 2023-07-02 ENCOUNTER — Telehealth: Payer: Self-pay | Admitting: Pharmacist

## 2023-07-02 ENCOUNTER — Telehealth: Payer: Self-pay

## 2023-07-02 NOTE — Telephone Encounter (Signed)
 Pharmacy Patient Advocate Encounter   Received notification from CoverMyMeds that prior authorization for FREESTYLE LIBRE 3 PLUS SENSORS is required/requested.   Insurance verification completed.   The patient is insured through [facility] .   PA required; PA submitted to above mentioned insurance via CoverMyMeds Key/confirmation #/EOC [id number]. Status is pending

## 2023-07-02 NOTE — Telephone Encounter (Signed)
 Pharmacy Patient Advocate Encounter  Received notification from [facility] that Prior Authorization for FREESTYLE LIBRE 3 PLUS SENSORS has been APPROVED from 07/02/23 to 12/29/23   PA #/Case ID/Reference #: [id number]

## 2023-07-02 NOTE — Telephone Encounter (Signed)
 Patient contacts office in follow-up of new prescription to pharmacy for CGM - sensors and reader.  Patient shared the her pharmacy told her that she will need a Prior Authorization (PA) for her devices.   I shared with her that the PA process will take a few days.  I asked her to contact her pharmacy in a few days to determine availability/approval.  She verbalized understanding of plan for Libre 3+  Additionally, patient reported that her ZIO patch "failed"  She states it was flashing and she talked with a company representative who advised returning the device for replacement.  She will need to have new ZIO patch placed at future visit.    Total time with patient call and documentation of interaction: 11 minutes.

## 2023-07-03 ENCOUNTER — Other Ambulatory Visit (HOSPITAL_COMMUNITY): Payer: Self-pay

## 2023-07-03 ENCOUNTER — Telehealth: Payer: Self-pay | Admitting: Pharmacist

## 2023-07-03 NOTE — Telephone Encounter (Signed)
 Contacted pharmacy to share CGM PA approved and check on prescription processing.  Sensors covered and READER NOT.  Cost was $85 I communicated this challenge to Aron Lard, CPhT for assistance/ follow-up.   Contacted patient to share that we are still working through [place] process.  Shared that we may need to try alternate CGM - Dexcom G7 to allow for coverage and use of Dexcom Receiver Sample from our office.  Patient aware that more follow-up coming.    Total time with patient call and documentation of interaction: 11 minutes.  F/U pending approval.

## 2023-07-03 NOTE — Telephone Encounter (Signed)
 Reviewed and agree with Dr Macky Lower plan.

## 2023-07-03 NOTE — Telephone Encounter (Signed)
 Informed pharmacy staff to fill reader for 365 day supply. Reader went through for fill.

## 2023-07-04 ENCOUNTER — Telehealth: Payer: Self-pay | Admitting: Internal Medicine

## 2023-07-04 NOTE — Telephone Encounter (Signed)
 Patient stated she received her heart monitor and wants assistance with placing the monitor.

## 2023-07-04 NOTE — Telephone Encounter (Signed)
 If you would like to come in Monday, 07/08/23, I will be able to schedule you to have your ZIO patch applied.  Please call [facility] at [phone] to set up a time.

## 2023-07-08 ENCOUNTER — Ambulatory Visit: Attending: Internal Medicine

## 2023-07-16 ENCOUNTER — Encounter: Payer: Self-pay | Admitting: Pharmacist

## 2023-07-16 ENCOUNTER — Ambulatory Visit (INDEPENDENT_AMBULATORY_CARE_PROVIDER_SITE_OTHER): Admitting: Family Medicine

## 2023-07-16 ENCOUNTER — Ambulatory Visit: Admitting: Pharmacist

## 2023-07-16 VITALS — BP 113/88 | HR 87 | Wt 319.4 lb

## 2023-07-16 DIAGNOSIS — Z7985 Long-term (current) use of injectable non-insulin antidiabetic drugs: Secondary | ICD-10-CM | POA: Diagnosis not present

## 2023-07-16 DIAGNOSIS — R1033 Periumbilical pain: Secondary | ICD-10-CM

## 2023-07-16 DIAGNOSIS — F5101 Primary insomnia: Secondary | ICD-10-CM

## 2023-07-16 DIAGNOSIS — E1140 Type 2 diabetes mellitus with diabetic neuropathy, unspecified: Secondary | ICD-10-CM | POA: Diagnosis not present

## 2023-07-16 DIAGNOSIS — F419 Anxiety disorder, unspecified: Secondary | ICD-10-CM

## 2023-07-16 MED ORDER — PANTOPRAZOLE SODIUM 40 MG PO TBEC: 40.0000 mg | DELAYED_RELEASE_TABLET | Freq: Every day | ORAL | 3 refills | Status: DC

## 2023-07-16 MED ORDER — TRAMADOL HCL 50 MG PO TABS: 50.0000 mg | ORAL_TABLET | Freq: Two times a day (BID) | ORAL | 0 refills | Status: DC | PRN

## 2023-07-16 MED ORDER — PANTOPRAZOLE SODIUM 40 MG PO TBEC
40.0000 mg | DELAYED_RELEASE_TABLET | Freq: Every day | ORAL | 3 refills | Status: AC
Start: 2023-07-16 — End: ?

## 2023-07-16 MED ORDER — DULOXETINE HCL 60 MG PO CPEP: 60.0000 mg | ORAL_CAPSULE | Freq: Every day | ORAL | 3 refills | Status: DC

## 2023-07-16 MED ORDER — TRAZODONE HCL 50 MG PO TABS: 50.0000 mg | ORAL_TABLET | Freq: Every evening | ORAL | 3 refills | Status: AC | PRN

## 2023-07-16 NOTE — Patient Instructions (Signed)
 Good to see you today - Thank you for coming in  Things we discussed today:  1) It makes sense that your anxiety and sleep have been uncontrolled since you are dealing with a lot of stressful stuff at home.  - I highly recommend going to [employer] to get yourself a therapist. They can help with anxiety and sleep - Increase your cymbalta  to 60mg  once a day. Take it during the daytime, it can make you a little more awake. - Take trazadone 25mg  (half a tablet) about 1 hour before bedtime. If this is not enough, you can take a full tablet. Try to use this only as needed. We will only use it for a short period of time to avoid addiction.

## 2023-07-16 NOTE — Patient Instructions (Signed)
 It was nice to see you today!  Enjoy your CGM.  We will plan to change the next sensor at your next visit.   Your goal blood sugar is 80-130 before eating and less than 180 after eating.  Medication Changes: Continue all other medication the same.    Keep up the good work with diet and exercise. Aim for a diet full of vegetables, fruit and lean meats (chicken, [place], fish). Try to limit salt intake by eating fresh or frozen vegetables (instead of canned), rinse canned vegetables prior to cooking and do not add any additional salt to meals.

## 2023-07-16 NOTE — Progress Notes (Signed)
    SUBJECTIVE:   CHIEF COMPLAINT / HPI:   Rita Bass is a 58yo F w/ hx of T2DM, OA of knees, chronic pain that p/f   - Feels like mood is better but still not sleeping well. - Reports feeling tired all day.  - Reports difficulty falling asleep. She will be laying in bed for hours. - Tries relaxing and trying to take a shower - Also reports her chronic abm and knee pain is keeping her up. - Her son is still staying with her. He has stopped taking all his schizophrenia meds and she is unable to get him to take it. This is causing a lot of stress and anxiety for her. She is his primary caretaker.  - Son is also skipping his therapy appointments.  - Has tried some advil  PM to try to sleep.  - Has not used melatonin  OBJECTIVE:   LMP 04/05/2016   Vitals per same-day visit w/ Dr. Alice Bass BP: 113/88 Pulse: 87 SpO2: 98%  General: Alert, pleasant woman. NAD. HEENT: NCAT. MMM. CV: RRR, no murmurs.  Resp: CTAB, no wheezing or crackles. Normal WOB on RA.  Ext: Moves all ext spontaneously Skin: Warm, well perfused   ASSESSMENT/PLAN:   Assessment & Plan Anxious mood Slight improvement, but still uncontrolled. Encouraged to get connected with therapy.  - Increase duloxetine  to 60mg  daily. Advised to take this in AM.  - Advised pt to get therapist.  - f/u in 2-3 months Primary insomnia Likely 2/2 to anxiety and life stressors, in addition to chronic abm and knee OA pain. Advised to stop advil  PM given hx of gastritis.  - Start Trazadone 25mg  nightly prn for sleep. Can increase to 50mg  if needed. This is a short-term course until she can get her anxiety better controlled. Plan to wean after 2-3 months if able. - Has GI f/u for gastritis and SM f/u for knee pain  Rita Huh, MD [facility]

## 2023-07-16 NOTE — Assessment & Plan Note (Signed)
 Diabetes longstanding currently uncontrolled with most recent A1c of 10.4%. Patient is able to verbalize appropriate hypoglycemia management plan. Medication adherence appears okay. Control is suboptimal due to mixture of diet, lifestyle and adherence. -[employer] 3+ sensor to patient's arm -Majority of visit spent setting up Libre 3+ sensor with reader and explaining use to patient. -Appointment set in two weeks to go over results and will make medication adjustments as necessary.

## 2023-07-16 NOTE — Progress Notes (Signed)
    S:     Chief Complaint  Patient presents with   Medication Management    Diabetes - CGM   58 y.o. female who presents for diabetes evaluation, education, and management. Patient arrives in good spirits and presents without any assistance. Patient is accompanied by her daughter Irena Manners.   Patient is also being seen by Primary Care Provider, Dr. Jari Merles, today 07/16/2023.  PMH is significant for T2DM, HTN, GERD, OSA, CAD, obesity.  Patient reports Diabetes was diagnosed in 2019.   Current diabetes medications include: jardiance  25 mg, insulin  glargine 20 units, metformin  500 mg 2 tabs BID, semaglutide  (Ozempic ) 2 mg once weekly Current hypertension medications include: spironolactone  25 mg  Patient reports adherence to taking all medications as prescribed.   O:   Review of Systems  All other systems reviewed and are negative.   Physical Exam Vitals reviewed.  Constitutional:      Appearance: Normal appearance.  Pulmonary:     Effort: Pulmonary effort is normal.  Neurological:     Mental Status: She is alert.  Psychiatric:        Mood and Affect: Mood normal.        Thought Content: Thought content normal.        Judgment: Judgment normal.    Lab Results  Component Value Date   HGBA1C 10.4 (A) 06/18/2023   Vitals:   07/16/23 1017  BP: 113/88  Pulse: 87  SpO2: 98%    Lipid Panel     Component Value Date/Time   CHOL 192 06/21/2020 1547   TRIG 131 06/21/2020 1547   HDL 54 06/21/2020 1547   CHOLHDL 3.6 06/21/2020 1547   LDLCALC 115 (H) 06/21/2020 1547   LDLDIRECT 117 (H) 02/28/2021 0917    Clinical Atherosclerotic Cardiovascular Disease (ASCVD): Yes  The ASCVD Risk score (Arnett DK, et al., 2019) failed to calculate for the following reasons:   Cannot find a previous HDL lab   Cannot find a previous total cholesterol lab   Patient is participating in a Managed Medicaid Plan:  Yes   A/P: Diabetes longstanding currently uncontrolled with most recent  A1c of 10.4%. Patient is able to verbalize appropriate hypoglycemia management plan. Medication adherence appears okay. Control is suboptimal due to mixture of diet, lifestyle and adherence. -[employer] 3+ sensor to patient's arm -Majority of visit spent setting up Libre 3+ sensor with reader and explaining use to patient. -Appointment set in two weeks to go over results and will make medication adjustments as necessary. -Next A1c anticipated July 2025.   Refills sent for pantoprazole  40 mg to pharmacy.  Written patient instructions provided. Patient verbalized understanding of treatment plan.  Total time in face to face counseling 17 minutes.    Follow-up:  Pharmacist in two weeks PCP clinic visit as needed  Patient seen with Jeanine Millers, PharmD Candidate.

## 2023-07-17 NOTE — Progress Notes (Signed)
 Reviewed and agree with Dr Macky Lower plan.

## 2023-07-26 ENCOUNTER — Ambulatory Visit (INDEPENDENT_AMBULATORY_CARE_PROVIDER_SITE_OTHER): Admitting: Pain Medicine

## 2023-07-26 ENCOUNTER — Ambulatory Visit
Admission: RE | Admit: 2023-07-26 | Discharge: 2023-07-26 | Disposition: A | Discharge status: Home / Self Care | Source: Ambulatory Visit | Attending: Pain Medicine | Admitting: Pain Medicine

## 2023-07-26 VITALS — BP 144/96 | Ht 64.0 in | Wt 318.0 lb

## 2023-07-26 DIAGNOSIS — M170 Bilateral primary osteoarthritis of knee: Secondary | ICD-10-CM

## 2023-07-26 NOTE — Progress Notes (Signed)
 DATE OF VISIT: 07/26/2023        Rita Bass DOB: Jan 25, 1966 MRN: [id number]  CC:  bilateral knee pain  History- Rita Bass is a 58 y.o. female in clinic for evaluation and treatment of bilateral knee pain secondary to OA.   Pain complaint: Location: R>L knee Severity: 10/10 at worst Timing: constant Radiation: primarily isolated to knee, though c/o sole of foot numbness as well. Aggravating factors: walking, standing Alleviating factors: NSAIDs, robaxin  Attribution: OA  Bilateral genicular blocks from prior visit had 75% pain relief for 1-2 days, with benefit for about 1 month total.  States her right knee pain has increased recently with R>L.  Also notes she has recently was diagnosed with a "heart blockage".  Past Medical History Past Medical History:  Diagnosis Date   Arthritis    Asthma    Chronic pain of left knee    Complication of anesthesia    PONV   Diabetes (HCC)    GERD (gastroesophageal reflux disease)    H/O bronchitis    Hepatic steatosis    Internal hemorrhoids    Kidney stones    Obesity    Renal disorder    Shortness of breath dyspnea    Sleep apnea    past issues - at weight over 400lbs   Sleep apnea in adult    Polysomnogram pending.  Followed by Dr. Matilde Son    Past Surgical History Past Surgical History:  Procedure Laterality Date   BIOPSY  11/01/2020   Procedure: BIOPSY;  Surgeon: Annis Kinder, DO;  Location: [facility];  Service: Gastroenterology;;   BIOPSY  05/11/2021   Procedure: BIOPSY;  Surgeon: Annis Kinder, DO;  Location: [facility];  Service: Gastroenterology;;   BIOPSY  11/29/2022   Procedure: BIOPSY;  Surgeon: Annis Kinder, DO;  Location: [facility];  Service: Gastroenterology;;   CESAREAN SECTION     6 c-sections   CHOLECYSTECTOMY     COLONOSCOPY WITH PROPOFOL  N/A 05/11/2021   Procedure: COLONOSCOPY WITH PROPOFOL ;  Surgeon: Annis Kinder, DO;  Location: [facility];  Service:  Gastroenterology;  Laterality: N/A;   ESOPHAGOGASTRODUODENOSCOPY (EGD) WITH PROPOFOL  N/A 11/01/2020   Procedure: ESOPHAGOGASTRODUODENOSCOPY (EGD) WITH PROPOFOL ;  Surgeon: Annis Kinder, DO;  Location: [facility];  Service: Gastroenterology;  Laterality: N/A;   ESOPHAGOGASTRODUODENOSCOPY (EGD) WITH PROPOFOL  N/A 11/29/2022   Procedure: ESOPHAGOGASTRODUODENOSCOPY (EGD) WITH PROPOFOL ;  Surgeon: Annis Kinder, DO;  Location: [facility];  Service: Gastroenterology;  Laterality: N/A;   HERNIA REPAIR     KIDNEY STONE SURGERY     OOPHORECTOMY     TRANSTHORACIC ECHOCARDIOGRAM  09/2017   Technically difficult study.  Did not use Definity  contrast.  EF was 55 to 60% with moderate LVH and grade 1 diastolic dysfunction.  No significant valvular lesions noted.  No regional wall motion normality but difficult to assess due to poor imaging.    TUBAL LIGATION     UPPER GASTROINTESTINAL ENDOSCOPY      Medications Current Outpatient Medications  Medication Sig Dispense Refill   Accu-Chek Softclix Lancets lancets USE IN THE MORNING, AT NOON AND AT BEDTIME 100 each 12   acetaminophen  (TYLENOL ) 650 MG CR tablet Take 650-1,300 mg by mouth every 8 (eight) hours as needed for pain. Last dose (2) around 0930 am.     albuterol  (PROVENTIL ) (5 MG/ML) 0.5% nebulizer solution Take 0.5 mLs (2.5 mg total) by nebulization every 6 (six) hours as needed for wheezing or shortness of breath. (Patient not taking:  Reported on 07/01/2023) 20 mL 0   aspirin  81 MG chewable tablet Chew 1 tablet (81 mg total) by mouth daily. (Patient not taking: Reported on 07/16/2023) 30 tablet 0   Blood Glucose Monitoring Suppl DEVI 1 each by Does not apply route in the morning, at noon, and at bedtime. May substitute to any manufacturer covered by patient's insurance. 1 each 0   Continuous Glucose Sensor (FREESTYLE LIBRE 3 PLUS SENSOR) MISC Change sensor every 15 days. (Patient not taking: Reported on 07/16/2023) 2 each 11   DULoxetine   (CYMBALTA ) 60 MG capsule Take 1 capsule (60 mg total) by mouth daily. 30 capsule 3   empagliflozin  (JARDIANCE ) 25 MG TABS tablet Take 1 tablet (25 mg total) by mouth daily. 90 tablet 3   gabapentin  (NEURONTIN ) 100 MG capsule TAKE 4 CAPSULES(400 MG) BY MOUTH THREE TIMES DAILY 360 capsule 2   glucose blood (ACCU-CHEK GUIDE) test strip 1 each by Other route daily. for testing 100 strip 1   hydrocortisone  (ANUSOL -HC) 2.5 % rectal cream Place rectally as needed for hemorrhoids or anal itching. (Patient not taking: Reported on 07/16/2023)     insulin  glargine (LANTUS ) 100 UNIT/ML Solostar Pen Inject 20 Units into the skin every morning. 15 mL 11   Insulin  Pen Needle (PEN NEEDLES) 32G X 5 MM MISC Use as directed with victoza  30 each 3   ipratropium-albuterol  (DUONEB) 0.5-2.5 (3) MG/3ML SOLN Take 3 mLs by nebulization every 6 (six) hours as needed. 180 mL 0   metFORMIN  (GLUCOPHAGE -XR) 500 MG 24 hr tablet Take 2 tablets (1,000 mg total) by mouth 2 (two) times daily. 360 tablet 3   methocarbamol  (ROBAXIN ) 500 MG tablet Take 1 tablet (500 mg total) by mouth every 8 (eight) hours as needed. 60 tablet 1   mometasone -formoterol  (DULERA ) 100-5 MCG/ACT AERO Inhale 2 puffs into the lungs daily. (Patient not taking: Reported on 07/01/2023) 39 g 3   nystatin  (MYCOSTATIN /NYSTOP ) powder Apply 1 Application topically 2 (two) times daily. (Patient not taking: Reported on 07/01/2023) 60 g 2   olopatadine  (PATANOL) 0.1 % ophthalmic solution Place 1 drop into both eyes 2 (two) times daily. (Patient not taking: Reported on 07/16/2023) 5 mL 12   pantoprazole  (PROTONIX ) 40 MG tablet Take 1 tablet (40 mg total) by mouth daily. 90 tablet 3   Semaglutide , 2 MG/DOSE, (OZEMPIC , 2 MG/DOSE,) 8 MG/3ML SOPN Inject 2 mg into the skin once a week. 3 mL 5   spironolactone  (ALDACTONE ) 25 MG tablet Take 1 tablet (25 mg total) by mouth daily. 90 tablet 3   traMADol  (ULTRAM ) 50 MG tablet Take 1 tablet (50 mg total) by mouth every 12 (twelve) hours  as needed. Not to be refilled before 30 days 60 tablet 0   traZODone  (DESYREL ) 50 MG tablet Take 1 tablet (50 mg total) by mouth at bedtime as needed for sleep. 30 tablet 3   No current facility-administered medications for this visit.    Allergies has no known allergies.  Family History - reviewed per EMR and intake form  Social History   reports no history of alcohol use.  reports that she has never smoked. She has never been exposed to tobacco smoke. She has never used smokeless tobacco.  reports no history of drug use.  EXAM: Vitals: BP (!) 144/96   Ht 5\' 4"  (1.626 m)   Wt (!) 318 lb (144.2 kg)   LMP 04/05/2016   BMI 54.58 kg/m  General: AOx3, NAD, pleasant SKIN: no rashes or lesions, skin clean,  dry, intact MSK: No erythema or skin lesions. ROM reduced bilaterally in both extension and flexion.    ASSESSMENT:   ICD-10-CM   1. Primary osteoarthritis of both knees  M17.0 DG Knee Bilateral Standing AP       PLAN: 1. Knee pain - Patient had good relief after prognostic blocks.  Wishes to proceed with chemoneurolysis over that of RFA at this time due to travel issues to [facility].  R/B/A explained to patient to include bleeding, infection, potential for ablation of other structures, weakness, sensory changes, continued pain.  Patient would like to proceed.  Will need driver for after visit. - Will see patient back for bilateral genicular chemoneurolysis. - Will order bilateral knee xrays given worsening pain.   2. Patient now on cymbalta  60mg  daily for past 2 weeks.  Advised patient to continue this at this time for pain. 3. Hold on PT at this time due to recent cardiac status change.   The patient will return to see me in 2 weeks, will call sooner with any questions/concerns.   Patient expressed understanding & agreement with above.

## 2023-07-30 ENCOUNTER — Ambulatory Visit: Admitting: Pharmacist

## 2023-08-02 ENCOUNTER — Encounter (HOSPITAL_COMMUNITY): Payer: Self-pay

## 2023-08-02 ENCOUNTER — Ambulatory Visit (HOSPITAL_COMMUNITY)
Admission: EM | Admit: 2023-08-02 | Discharge: 2023-08-02 | Disposition: A | Discharge status: Home / Self Care | Attending: Emergency Medicine | Admitting: Emergency Medicine

## 2023-08-02 DIAGNOSIS — M79662 Pain in left lower leg: Secondary | ICD-10-CM | POA: Diagnosis not present

## 2023-08-02 NOTE — ED Triage Notes (Signed)
 Pt c/o LLE pain and swelling x4 days. Denies long travels. States took her gabapentin  and tylenol  with little relief.

## 2023-08-02 NOTE — ED Provider Notes (Signed)
 [facility]    CSN: [id number] Arrival date & time: 08/02/23  1002      History   Chief Complaint Chief Complaint  Patient presents with   Leg Pain    HPI Rita Bass is a 58 y.o. female.  4 day history of left calf pain and swelling Currently rating 5/10 pain Denies any injury, trauma, fall No recent immobilization, long trips, or surgeries Denies history of blood clot Has taken gabapentin  and tylenol  with little relief   History of type 2 diabetes with neuropathy, chronic knee pain  Not having chest pain or pressure, shortness of breath.  Past Medical History:  Diagnosis Date   Arthritis    Asthma    Chronic pain of left knee    Complication of anesthesia    PONV   Diabetes (HCC)    GERD (gastroesophageal reflux disease)    H/O bronchitis    Hepatic steatosis    Internal hemorrhoids    Kidney stones    Obesity    Renal disorder    Shortness of breath dyspnea    Sleep apnea    past issues - at weight over 400lbs   Sleep apnea in adult    Polysomnogram pending.  Followed by Dr. Matilde Son    Patient Active Problem List   Diagnosis Date Noted   Palpitations 06/27/2023   Coronary artery disease involving native coronary artery of native heart without angina pectoris 06/27/2023   Sleep disturbance 06/20/2023   Dyspnea 03/19/2023   Shortness of breath 03/18/2023   Chronic abdominal pain 12/24/2022   Medication management 12/21/2022   Chronic Helicobacter pylori gastritis 11/29/2022   Gastric ulcer without hemorrhage or perforation 11/29/2022   Gastroesophageal reflux disease with esophagitis without hemorrhage 11/29/2022   Change in bowel habits 11/29/2022   Diarrhea 11/29/2022   Asthma 10/22/2022   Primary osteoarthritis of both knees 10/03/2022   Wound check, abscess 07/21/2022   Type 2 diabetes mellitus with diabetic neuropathy, unspecified (HCC) 04/30/2022   Chronic osteoarthritis 04/30/2022   Rash 01/15/2022   External hemorrhoid  09/15/2021   Generalized abdominal pain 08/30/2021   Ventral hernia 08/16/2021   Allergic conjunctivitis 07/28/2021   Grade II internal hemorrhoids    H. 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   Abscess 10/25/2017   Diabetic nephropathy associated with type 2 diabetes mellitus (HCC) 10/25/2017   Type 2 diabetes mellitus with complication (HCC) 09/13/2017   Sleep apnea 08/16/2017   Chronic pain of right knee 07/13/2016   Morbid obesity (HCC) 02/04/2015   Menorrhagia with regular cycle 11/18/2014    Past Surgical History:  Procedure Laterality Date   BIOPSY  11/01/2020   Procedure: BIOPSY;  Surgeon: Annis Kinder, DO;  Location: [facility];  Service: Gastroenterology;;   BIOPSY  05/11/2021   Procedure: BIOPSY;  Surgeon: Annis Kinder, DO;  Location: [facility];  Service: Gastroenterology;;   BIOPSY  11/29/2022   Procedure: BIOPSY;  Surgeon: Annis Kinder, DO;  Location: [facility];  Service: Gastroenterology;;   CESAREAN SECTION     6 c-sections   CHOLECYSTECTOMY     COLONOSCOPY WITH PROPOFOL  N/A 05/11/2021   Procedure: COLONOSCOPY WITH PROPOFOL ;  Surgeon: Annis Kinder, DO;  Location: [facility];  Service: Gastroenterology;  Laterality: N/A;   ESOPHAGOGASTRODUODENOSCOPY (EGD) WITH PROPOFOL  N/A 11/01/2020   Procedure: ESOPHAGOGASTRODUODENOSCOPY (EGD) WITH PROPOFOL ;  Surgeon: Annis Kinder, DO;  Location: [facility];  Service: Gastroenterology;  Laterality: N/A;   ESOPHAGOGASTRODUODENOSCOPY (EGD) WITH PROPOFOL  N/A 11/29/2022   Procedure: ESOPHAGOGASTRODUODENOSCOPY (EGD) WITH PROPOFOL ;  Surgeon: Annis Kinder, DO;  Location: [facility];  Service: Gastroenterology;  Laterality: N/A;   HERNIA REPAIR     KIDNEY STONE SURGERY     OOPHORECTOMY     TRANSTHORACIC ECHOCARDIOGRAM  09/2017   Technically difficult study.  Did not use Definity  contrast.  EF was 55 to 60% with moderate LVH and grade 1 diastolic dysfunction.  No significant valvular lesions noted.  No regional wall motion normality but difficult to assess due to poor imaging.    TUBAL LIGATION     UPPER GASTROINTESTINAL ENDOSCOPY      OB History     Gravida  6   Para  6   Term  5   Preterm  1   AB  0   Living  7      SAB  0   IAB  0   Ectopic  0   Multiple  2   Live Births               Home Medications    Prior to Admission medications   Medication Sig Start Date End Date Taking? Authorizing Provider  Accu-Chek Softclix Lancets lancets USE IN THE [place], AT NOON AND AT BEDTIME 05/06/23   Albin Huh, MD  acetaminophen  (TYLENOL ) 650 MG CR tablet Take 650-1,300 mg by mouth every 8 (eight) hours as needed for pain. Last dose (2) around 0930 am.    [provider]  albuterol  (PROVENTIL ) (5 MG/ML) 0.5% nebulizer solution Take 0.5 mLs (2.5 mg total) by nebulization every 6 (six) hours as needed for wheezing or shortness of breath. Patient not taking: Reported on 07/01/2023 02/25/23   Albin Huh, MD  aspirin  81 MG chewable tablet Chew 1 tablet (81 mg total) by mouth daily. Patient not taking: Reported on 07/16/2023 03/25/23   Chatterjee, Srobona Tublu, MD  Blood Glucose Monitoring Suppl DEVI 1 each by Does not apply route in the morning, at noon, and at bedtime. May substitute to any manufacturer covered by patient's insurance. 03/26/23   Albin Huh, MD  Continuous Glucose Sensor  (FREESTYLE LIBRE 3 PLUS SENSOR) MISC Change sensor every 15 days. Patient not taking: Reported on 07/16/2023 07/01/23   McDiarmid, Demetra Filter, MD  DULoxetine  (CYMBALTA ) 60 MG capsule Take 1 capsule (60 mg total) by mouth daily. 07/16/23   Albin Huh, MD  empagliflozin  (JARDIANCE ) 25 MG TABS tablet Take 1 tablet (25 mg total) by mouth daily. 06/25/22   Albin Huh, MD  gabapentin  (NEURONTIN ) 100 MG capsule TAKE 4 CAPSULES(400 MG) BY MOUTH THREE TIMES DAILY 03/26/23   Albin Huh, MD  glucose blood (ACCU-CHEK GUIDE) test strip 1 each by Other route daily. for testing 01/16/23   McDiarmid, Demetra Filter, MD  hydrocortisone  (ANUSOL -HC) 2.5 % rectal cream Place rectally as needed for hemorrhoids or anal itching. Patient not taking: Reported on 07/16/2023 04/06/23   [provider]  insulin  glargine (LANTUS ) 100 UNIT/ML Solostar Pen Inject 20 Units into the skin every morning. 07/01/23   McDiarmid, Demetra Filter, MD  Insulin  Pen Needle (PEN NEEDLES) 32G X 5 MM MISC Use as directed with victoza  11/25/20   Hensel, Azucena Bollard, MD  ipratropium-albuterol  (DUONEB) 0.5-2.5 (3) MG/3ML SOLN Take 3 mLs by nebulization every 6 (six) hours as needed. 05/02/23 06/27/23  McDiarmid, Demetra Filter, MD  metFORMIN  (GLUCOPHAGE -XR) 500 MG 24 hr tablet Take 2 tablets (1,000 mg total) by mouth 2 (two) times daily. 09/27/22   Albin Huh, MD  methocarbamol  (ROBAXIN ) 500 MG tablet Take 1 tablet (500 mg total) by mouth every 8 (eight) hours as needed. 06/10/23   Marvel Slicker C, DO  mometasone -formoterol  (DULERA ) 100-5 MCG/ACT AERO Inhale 2 puffs into the lungs daily. Patient not taking: Reported on 07/01/2023 10/22/22   Zheng, Jacky, MD  nystatin  (MYCOSTATIN /NYSTOP ) powder Apply 1 Application topically 2 (two) times daily. Patient not taking: Reported on 07/01/2023 01/25/23   Albin Huh, MD  olopatadine  (PATANOL) 0.1 % ophthalmic solution Place 1 drop into both eyes 2 (two) times daily. Patient not taking: Reported on 07/16/2023 07/28/21   Espinoza, Alejandra,  DO  pantoprazole  (PROTONIX ) 40 MG tablet Take 1 tablet (40 mg total) by mouth daily. 07/16/23   Albin Huh, MD  Semaglutide , 2 MG/DOSE, (OZEMPIC , 2 MG/DOSE,) 8 MG/3ML SOPN Inject 2 mg into the skin once a week. 03/04/23   Albin Huh, MD  spironolactone  (ALDACTONE ) 25 MG tablet Take 1 tablet (25 mg total) by mouth daily. 06/27/23   Chandrasekhar, Caretha Chapel, MD  traMADol  (ULTRAM ) 50 MG tablet Take 1 tablet (50 mg total) by mouth every 12 (twelve) hours as needed. Not to be refilled before 30 days 07/16/23   Albin Huh, MD  traZODone  (DESYREL ) 50 MG tablet Take 1 tablet (50 mg total) by mouth at bedtime as needed for sleep. 07/16/23   Albin Huh, MD    Family History Family History  Problem Relation Age of Onset   Breast cancer Mother    Other Father        committed suicide   Diabetes Father    Heart attack Maternal Aunt    Colon cancer Neg Hx    Esophageal cancer Neg Hx    Pancreatic cancer Neg Hx    Stomach cancer Neg Hx    Liver disease Neg Hx    Colon polyps Neg Hx    Rectal cancer Neg Hx     Social History Social History   Tobacco Use   Smoking status: Never    Passive exposure: Never   Smokeless tobacco: Never  Vaping Use   Vaping status: Never Used  Substance Use Topics   Alcohol use: No   Drug use: No     Allergies   Patient has no known allergies.   Review of Systems Review of Systems As per HPI  Physical Exam Triage Vital Signs ED Triage Vitals  Encounter Vitals Group     BP 08/02/23 1050 123/84     Systolic BP Percentile --      Diastolic BP Percentile --      Pulse Rate 08/02/23 1050 87     Resp 08/02/23 1050 18     Temp 08/02/23 1050 97.8 F (36.6 C)     Temp Source 08/02/23 1050 Oral     SpO2 08/02/23 1050 95 %     Weight --      Height --      Head Circumference --  Peak Flow --      Pain Score 08/02/23 1051 5     Pain Loc --      Pain Education --      Exclude from Growth Chart --    No data found.  Updated Vital Signs BP  123/84 (BP Location: Right Arm)   Pulse 87   Temp 97.8 F (36.6 C) (Oral)   Resp 18   LMP 04/05/2016   SpO2 95%   Visual Acuity Right Eye Distance:   Left Eye Distance:   Bilateral Distance:    Right Eye Near:   Left Eye Near:    Bilateral Near:     Physical Exam Vitals and nursing note reviewed.  Constitutional:      General: She is not in acute distress.    Appearance: She is not ill-appearing.  HENT:     Mouth/Throat:     Pharynx: Oropharynx is clear.  Cardiovascular:     Rate and Rhythm: Normal rate and regular rhythm.     Pulses: Normal pulses.     Heart sounds: Normal heart sounds.  Pulmonary:     Effort: Pulmonary effort is normal.     Breath sounds: Normal breath sounds.  Musculoskeletal:     Cervical back: Normal range of motion.     Left lower leg: Tenderness present. No swelling.       Legs:     Comments: Left calf tenderness to palpation from popliteal to posterior ankle.  No obvious swelling of left lower extremity compared to right.  Habitus limits exam.  There is no erythema or warmth.  No skin changes noted.  Distal sensation intact, cap refill of the toes less than 2 seconds, DP pulse 2+.  Full range of motion at ankle.  Limited ROM at knee due to chronic pain  Skin:    Capillary Refill: Capillary refill takes less than 2 seconds.  Neurological:     Mental Status: She is alert and oriented to person, place, and time.     UC Treatments / Results  Labs (all labs ordered are listed, but only abnormal results are displayed) Labs Reviewed - No data to display  EKG   Radiology No results found.  Procedures Procedures (including critical care time)  Medications Ordered in UC Medications - No data to display  Initial Impression / Assessment and Plan / UC Course  I have reviewed the triage vital signs and the nursing notes.  Pertinent labs & imaging results that were available during my care of the patient were reviewed by me and considered in  my medical decision making (see chart for details).  Left calf pain with palpation.  Outpatient ultrasound is ordered for tomorrow morning at 11 AM.  Discussed if positive, patient will be automatically referred to DVT clinic.  She does not have any red flag symptoms at this time, stable vitals and overall well-appearing.  We discussed reasons to present directly to the emergency department.  Patient agrees to plan, no questions  Final Clinical Impressions(s) / UC Diagnoses   Final diagnoses:  Pain of left calf     Discharge Instructions      Please follow instructions below for your leg ultrasound  If at any point you develop worsening pain, swelling, redness or warmth, chest pain or tightness, shortness of breath, etc., please do not wait for your appointment -- go directly to the emergency department   ED Prescriptions   None    PDMP not reviewed this  encounter.   Creighton Doffing, [place] 08/02/23 1123

## 2023-08-02 NOTE — Discharge Instructions (Signed)
 Please follow instructions below for your leg ultrasound  If at any point you develop worsening pain, swelling, redness or warmth, chest pain or tightness, shortness of breath, etc., please do not wait for your appointment -- go directly to the emergency department

## 2023-08-03 ENCOUNTER — Emergency Department (HOSPITAL_COMMUNITY)
Admission: EM | Admit: 2023-08-03 | Discharge: 2023-08-03 | Disposition: A | Discharge status: Home / Self Care | Attending: Emergency Medicine | Admitting: Emergency Medicine

## 2023-08-03 ENCOUNTER — Other Ambulatory Visit: Payer: Self-pay

## 2023-08-03 ENCOUNTER — Encounter (HOSPITAL_COMMUNITY): Payer: Self-pay | Admitting: Emergency Medicine

## 2023-08-03 ENCOUNTER — Ambulatory Visit: Payer: Self-pay | Admitting: Emergency Medicine

## 2023-08-03 ENCOUNTER — Other Ambulatory Visit (HOSPITAL_COMMUNITY): Payer: Self-pay

## 2023-08-03 ENCOUNTER — Emergency Department (HOSPITAL_COMMUNITY): Admission: EM | Admit: 2023-08-03 | Source: Home / Self Care

## 2023-08-03 ENCOUNTER — Ambulatory Visit (HOSPITAL_COMMUNITY)
Admission: RE | Admit: 2023-08-03 | Discharge: 2023-08-03 | Disposition: A | Discharge status: Home / Self Care | Source: Ambulatory Visit | Attending: Emergency Medicine | Admitting: Emergency Medicine

## 2023-08-03 DIAGNOSIS — M79605 Pain in left leg: Secondary | ICD-10-CM | POA: Insufficient documentation

## 2023-08-03 DIAGNOSIS — Z79899 Other long term (current) drug therapy: Secondary | ICD-10-CM | POA: Diagnosis not present

## 2023-08-03 DIAGNOSIS — Z794 Long term (current) use of insulin: Secondary | ICD-10-CM | POA: Insufficient documentation

## 2023-08-03 DIAGNOSIS — Z7901 Long term (current) use of anticoagulants: Secondary | ICD-10-CM | POA: Diagnosis not present

## 2023-08-03 DIAGNOSIS — I509 Heart failure, unspecified: Secondary | ICD-10-CM | POA: Insufficient documentation

## 2023-08-03 DIAGNOSIS — I110 Hypertensive heart disease with heart failure: Secondary | ICD-10-CM | POA: Insufficient documentation

## 2023-08-03 DIAGNOSIS — Z7984 Long term (current) use of oral hypoglycemic drugs: Secondary | ICD-10-CM | POA: Insufficient documentation

## 2023-08-03 DIAGNOSIS — E1165 Type 2 diabetes mellitus with hyperglycemia: Secondary | ICD-10-CM | POA: Insufficient documentation

## 2023-08-03 DIAGNOSIS — I2510 Atherosclerotic heart disease of native coronary artery without angina pectoris: Secondary | ICD-10-CM | POA: Diagnosis not present

## 2023-08-03 DIAGNOSIS — J45909 Unspecified asthma, uncomplicated: Secondary | ICD-10-CM | POA: Diagnosis not present

## 2023-08-03 DIAGNOSIS — I82442 Acute embolism and thrombosis of left tibial vein: Secondary | ICD-10-CM | POA: Diagnosis not present

## 2023-08-03 DIAGNOSIS — I82402 Acute embolism and thrombosis of unspecified deep veins of left lower extremity: Secondary | ICD-10-CM | POA: Diagnosis not present

## 2023-08-03 LAB — CBC
HCT: 43.2 % (ref 36.0–46.0)
Hemoglobin: 13.4 g/dL (ref 12.0–15.0)
MCH: 23.1 pg — ABNORMAL LOW (ref 26.0–34.0)
MCHC: 31 g/dL (ref 30.0–36.0)
MCV: 74.6 fL — ABNORMAL LOW (ref 80.0–100.0)
Platelets: 243 10*3/uL (ref 150–400)
RBC: 5.79 MIL/uL — ABNORMAL HIGH (ref 3.87–5.11)
RDW: 15.9 % — ABNORMAL HIGH (ref 11.5–15.5)
WBC: 7.9 10*3/uL (ref 4.0–10.5)
nRBC: 0 % (ref 0.0–0.2)

## 2023-08-03 LAB — BASIC METABOLIC PANEL WITH GFR
Anion gap: 10 (ref 5–15)
BUN: 11 mg/dL (ref 6–20)
CO2: 27 mmol/L (ref 22–32)
Calcium: 9.2 mg/dL (ref 8.9–10.3)
Chloride: 100 mmol/L (ref 98–111)
Creatinine, Ser: 0.61 mg/dL (ref 0.44–1.00)
GFR, Estimated: >60 mL/min (ref >60)
Glucose, Bld: 221 mg/dL — ABNORMAL HIGH (ref 70–99)
Potassium: 4 mmol/L (ref 3.5–5.1)
Sodium: 137 mmol/L (ref 135–145)

## 2023-08-03 MED ORDER — RIVAROXABAN 15 MG PO TABS
15.0000 mg | ORAL_TABLET | Freq: Two times a day (BID) | ORAL | Status: DC
Start: 2023-08-03 — End: 2023-08-03

## 2023-08-03 MED ORDER — RIVAROXABAN 15 MG PO TABS
15.0000 mg | ORAL_TABLET | Freq: Two times a day (BID) | ORAL | Status: DC
  Administered 2023-08-03: 15 mg via ORAL
  Filled 2023-08-03: qty 1

## 2023-08-03 MED ORDER — RIVAROXABAN (XARELTO) VTE STARTER PACK (15 & 20 MG)
1.0000 | ORAL_TABLET | Freq: Every day | ORAL | 0 refills | Status: DC
Start: 2023-08-03 — End: 2023-09-13
  Filled 2023-08-03: qty 51, 30d supply, fill #0

## 2023-08-03 MED ORDER — RIVAROXABAN 10 MG PO TABS: 20.0000 mg | ORAL_TABLET | Freq: Every day | ORAL | Status: DC

## 2023-08-03 NOTE — Progress Notes (Signed)
 VASCULAR LAB    Left lower extremity venous duplex has been performed.  See CV proc for preliminary results.  Checked patient into ED for treatment  Jsaon Yoo, RVT 08/03/2023, 12:51 PM

## 2023-08-03 NOTE — ED Triage Notes (Signed)
 Pt here for US  of left. Seen at [facility] yesterday for left leg pain and swelling.

## 2023-08-03 NOTE — Discharge Instructions (Signed)
 Do not take aspirin  or any other NSAID such as Advil  or Motrin  while on Xarelto.  Please follow-up with your primary care provider.  Seek emergency care if experiencing any new or worsening symptoms.

## 2023-08-03 NOTE — ED Triage Notes (Signed)
 Pt sent from vascular with reports of +DVT in left leg.

## 2023-08-03 NOTE — ED Provider Notes (Signed)
 [place] EMERGENCY DEPARTMENT AT [facility] Provider Note   CSN: [id number] Arrival date & time: 08/03/23  1245     History  Chief Complaint  Patient presents with   DVT    Rita Bass is a 58 y.o. female with PMHx OA, asthma, DM, GERD, CHF, HTN, HLD, CAD who presents to ED concerned for DVT. Patient endorses left calf pain x4 days. Patient went to [facility] yesterday who ordered patient a DVT US  for this morning.   Patient denies any fever, chest pain, shortness of breath, nausea, vomiting, diarrhea.  Patient denies any GI bleeding.  States that stools are normal brown color.  HPI     Home Medications Prior to Admission medications   Medication Sig Start Date End Date Taking? Authorizing Provider  RIVAROXABAN Leward Record) VTE STARTER PACK (15 & 20 MG) Follow package directions: Take one 15mg  tablet by mouth twice a day. On day 22, switch to one 20mg  tablet once a day. Take with food. 08/03/23  Yes Redmond Whittley, Twila Gale F, PA-C  Accu-Chek Softclix Lancets lancets USE IN THE [place], AT NOON AND AT BEDTIME 05/06/23   Albin Huh, MD  acetaminophen  (TYLENOL ) 650 MG CR tablet Take 650-1,300 mg by mouth every 8 (eight) hours as needed for pain. Last dose (2) around 0930 am.    [provider]  albuterol  (PROVENTIL ) (5 MG/ML) 0.5% nebulizer solution Take 0.5 mLs (2.5 mg total) by nebulization every 6 (six) hours as needed for wheezing or shortness of breath. Patient not taking: Reported on 07/01/2023 02/25/23   Albin Huh, MD  Blood Glucose Monitoring Suppl DEVI 1 each by Does not apply route in the morning, at noon, and at bedtime. May substitute to any manufacturer covered by patient's insurance. 03/26/23   Albin Huh, MD  Continuous Glucose Sensor (FREESTYLE LIBRE 3 PLUS SENSOR) MISC Change sensor every 15 days. Patient not taking: Reported on 07/16/2023 07/01/23   McDiarmid, Demetra Filter, MD  DULoxetine  (CYMBALTA ) 60 MG capsule Take 1 capsule (60 mg total) by mouth daily.  07/16/23   Albin Huh, MD  empagliflozin  (JARDIANCE ) 25 MG TABS tablet Take 1 tablet (25 mg total) by mouth daily. 06/25/22   Albin Huh, MD  gabapentin  (NEURONTIN ) 100 MG capsule TAKE 4 CAPSULES(400 MG) BY MOUTH THREE TIMES DAILY 03/26/23   Albin Huh, MD  glucose blood (ACCU-CHEK GUIDE) test strip 1 each by Other route daily. for testing 01/16/23   McDiarmid, Demetra Filter, MD  hydrocortisone  (ANUSOL -HC) 2.5 % rectal cream Place rectally as needed for hemorrhoids or anal itching. Patient not taking: Reported on 07/16/2023 04/06/23   [provider]  insulin  glargine (LANTUS ) 100 UNIT/ML Solostar Pen Inject 20 Units into the skin every morning. 07/01/23   McDiarmid, Demetra Filter, MD  Insulin  Pen Needle (PEN NEEDLES) 32G X 5 MM MISC Use as directed with victoza  11/25/20   Hensel, Azucena Bollard, MD  ipratropium-albuterol  (DUONEB) 0.5-2.5 (3) MG/3ML SOLN Take 3 mLs by nebulization every 6 (six) hours as needed. 05/02/23 06/27/23  McDiarmid, Demetra Filter, MD  metFORMIN  (GLUCOPHAGE -XR) 500 MG 24 hr tablet Take 2 tablets (1,000 mg total) by mouth 2 (two) times daily. 09/27/22   Albin Huh, MD  methocarbamol  (ROBAXIN ) 500 MG tablet Take 1 tablet (500 mg total) by mouth every 8 (eight) hours as needed. 06/10/23   Marvel Slicker C, DO  mometasone -formoterol  (DULERA ) 100-5 MCG/ACT AERO Inhale 2 puffs into the lungs daily. Patient not taking: Reported on 07/01/2023 10/22/22   Albin Huh, MD  nystatin  (MYCOSTATIN /NYSTOP ) powder Apply 1 Application topically 2 (two) times daily. Patient not taking: Reported on 07/01/2023 01/25/23   Albin Huh, MD  olopatadine  (PATANOL) 0.1 % ophthalmic solution Place 1 drop into both eyes 2 (two) times daily. Patient not taking: Reported on 07/16/2023 07/28/21   Espinoza, Alejandra, DO  pantoprazole  (PROTONIX ) 40 MG tablet Take 1 tablet (40 mg total) by mouth daily. 07/16/23   Albin Huh, MD  Semaglutide , 2 MG/DOSE, (OZEMPIC , 2 MG/DOSE,) 8 MG/3ML SOPN Inject 2 mg into the skin once a week. 03/04/23    Albin Huh, MD  spironolactone  (ALDACTONE ) 25 MG tablet Take 1 tablet (25 mg total) by mouth daily. 06/27/23   Chandrasekhar, Caretha Chapel, MD  traMADol  (ULTRAM ) 50 MG tablet Take 1 tablet (50 mg total) by mouth every 12 (twelve) hours as needed. Not to be refilled before 30 days 07/16/23   Albin Huh, MD  traZODone  (DESYREL ) 50 MG tablet Take 1 tablet (50 mg total) by mouth at bedtime as needed for sleep. 07/16/23   Albin Huh, MD      Allergies    Patient has no known allergies.    Review of Systems   Review of Systems  Musculoskeletal:        Leg pain    Physical Exam Updated Vital Signs BP (!) 130/90   Pulse 72   Temp 97.7 F (36.5 C) (Oral)   Resp 16   Ht 5\' 4"  (1.626 m)   Wt (!) 144.2 kg   LMP 04/05/2016   SpO2 99%   BMI 54.57 kg/m  Physical Exam Vitals and nursing note reviewed.  Constitutional:      General: She is not in acute distress.    Appearance: She is not ill-appearing or toxic-appearing.  HENT:     Head: Normocephalic and atraumatic.     Mouth/Throat:     Mouth: Mucous membranes are moist.  Eyes:     General: No scleral icterus.       Right eye: No discharge.        Left eye: No discharge.     Conjunctiva/sclera: Conjunctivae normal.  Cardiovascular:     Rate and Rhythm: Normal rate and regular rhythm.     Pulses: Normal pulses.     Heart sounds: Normal heart sounds. No murmur heard. Pulmonary:     Effort: Pulmonary effort is normal. No respiratory distress.     Breath sounds: Normal breath sounds. No wheezing, rhonchi or rales.  Abdominal:     General: Abdomen is flat. Bowel sounds are normal.  Musculoskeletal:     Right lower leg: No edema.     Left lower leg: No edema.     Comments: Left leg: calf tenderness to palpation. +2 pedal pulse. Area non-tense. Sensation to light touch intact.   Skin:    General: Skin is warm and dry.     Findings: No rash.  Neurological:     General: No focal deficit present.     Mental Status: She is alert and  oriented to person, place, and time. Mental status is at baseline.  Psychiatric:        Mood and Affect: Mood normal.        Behavior: Behavior normal.     ED Results / Procedures / Treatments   Labs (all labs ordered are listed, but only abnormal results are displayed) Labs Reviewed  CBC - Abnormal; Notable for the following components:      Result Value   RBC 5.79 (*)  MCV 74.6 (*)    MCH 23.1 (*)    RDW 15.9 (*)    All other components within normal limits  BASIC METABOLIC PANEL WITH GFR - Abnormal; Notable for the following components:   Glucose, Bld 221 (*)    All other components within normal limits    EKG None  Radiology LE VENOUS Result Date: 08/03/2023  Lower Venous DVT Study Patient Name:  Rita Bass [facility]  Date of Exam:   08/03/2023 Medical Rec #: [id number]              Accession #:    [id number] Date of Birth: 21-Apr-1965              Patient Gender: F Patient Age:   54 years Exam Location:  [facility] Procedure:      VAS US  LOWER EXTREMITY VENOUS (DVT) Referring Phys: REBECCA RISING --------------------------------------------------------------------------------  Indications: Posterior leg pain X 4 days.  Limitations: Body habitus (BMI 54). Comparison Study: Prior negative left LEV done 04/17/14 Performing Technologist: Carleene Chase RVS  Examination Guidelines: A complete evaluation includes B-mode imaging, spectral Doppler, color Doppler, and power Doppler as needed of all accessible portions of each vessel. Bilateral testing is considered an integral part of a complete examination. Limited examinations for reoccurring indications may be performed as noted. The reflux portion of the exam is performed with the patient in reverse Trendelenburg.  +-----+---------------+---------+-----------+----------+----------------------+ RIGHTCompressibilityPhasicitySpontaneityPropertiesThrombus Aging          +-----+---------------+---------+-----------+----------+----------------------+ CFV                 Yes      Yes                  patent by color and                                                      Doppler                +-----+---------------+---------+-----------+----------+----------------------+   +---------+---------------+---------+-----------+----------+--------------+ LEFT     CompressibilityPhasicitySpontaneityPropertiesThrombus Aging +---------+---------------+---------+-----------+----------+--------------+ CFV      Full           Yes      Yes                                 +---------+---------------+---------+-----------+----------+--------------+ SFJ      Full                                                        +---------+---------------+---------+-----------+----------+--------------+ FV Prox  Full                                                        +---------+---------------+---------+-----------+----------+--------------+ FV Mid   Full                                                        +---------+---------------+---------+-----------+----------+--------------+  FV DistalFull                                                        +---------+---------------+---------+-----------+----------+--------------+ PFV      Full                                                        +---------+---------------+---------+-----------+----------+--------------+ POP      Full           Yes      Yes                                 +---------+---------------+---------+-----------+----------+--------------+ PTV      None                                         Acute          +---------+---------------+---------+-----------+----------+--------------+ PERO     None                                         Acute          +---------+---------------+---------+-----------+----------+--------------+    Summary: RIGHT: - No  evidence of common femoral vein obstruction.   LEFT: - Findings consistent with acute deep vein thrombosis involving the left posterior tibial veins, and left peroneal veins.  - No cystic structure found in the popliteal fossa.  *See table(s) above for measurements and observations.    Preliminary     Procedures Procedures    Medications Ordered in ED Medications  Rivaroxaban (XARELTO) tablet 15 mg (15 mg Oral Given 08/03/23 1439)    Followed by  rivaroxaban (XARELTO) tablet 20 mg (has no administration in time range)    ED Course/ Medical Decision Making/ A&P                                 Medical Decision Making Amount and/or Complexity of Data Reviewed Labs: ordered.  Risk Prescription drug management.   This patient presents to the ED for concern of left leg pain, this involves an extensive number of treatment options, and is a complaint that carries with it a high risk of complications and morbidity.  The differential diagnosis includes hemarthrosis, gout, septic joint, fracture, tendonitis, muscle strain, bursitis, compartment syndrome, DVT   Co morbidities that complicate the patient evaluation  OA, asthma, DM, GERD, CHF, HTN, HLD, CAD   Additional history obtained:  Additional history obtained from 08/03/2023 VAS DVT US : Findings consistent with acute deep vein thrombosis involving the left  posterior tibial veins, and left peroneal veins.    Problem List / ED Course / Critical interventions / Medication management  Patient presents to ED concerned for positive DVT ultrasound obtained earlier today.  Patient endorses left leg pain and swelling x 4 days.  Denies any other alarming symptoms today.  Physical exam with left calf tenderness to palpation.  Rest of physical exam reassuring.  Patient afebrile with stable vitals. I Ordered, and personally interpreted labs.  CBC without leukocytosis or anemia.  BMP with hyperglycemia 221. Patient denying any history of GI  bleeding.  Endorses normal brown stools.  Patient tolerated first dose of Xarelto while in ED.  Sent rest of course to [facility] pharmacy so that they can deliver before discharge. Recommended following up with PCP.  Patient verbalized understanding of plan. I have reviewed the patients home medicines and have made adjustments as needed The patient has been appropriately medically screened and/or stabilized in the ED. I have low suspicion for any other emergent medical condition which would require further screening, evaluation or treatment in the ED or require inpatient management. At time of discharge the patient is hemodynamically stable and in no acute distress. I have discussed work-up results and diagnosis with patient and answered all questions. Patient is agreeable with discharge plan. We discussed strict return precautions for returning to the emergency department and they verbalized understanding.     Social Determinants of Health:  none         Final Clinical Impression(s) / ED Diagnoses Final diagnoses:  Acute deep vein thrombosis (DVT) of tibial vein of left lower extremity (HCC)    Rx / DC Orders ED Discharge Orders          Ordered    RIVAROXABAN (XARELTO) VTE STARTER PACK (15 & 20 MG)  Daily        08/03/23 1424              [place], [place] 08/03/23 1444    Tegeler, Marine Sia, MD 08/03/23 [phone]

## 2023-08-04 ENCOUNTER — Emergency Department (HOSPITAL_COMMUNITY)
Admission: EM | Admit: 2023-08-04 | Discharge: 2023-08-04 | Disposition: A | Discharge status: Home / Self Care | Attending: Emergency Medicine | Admitting: Emergency Medicine

## 2023-08-04 ENCOUNTER — Emergency Department (HOSPITAL_COMMUNITY)

## 2023-08-04 ENCOUNTER — Encounter (HOSPITAL_COMMUNITY): Payer: Self-pay | Admitting: Emergency Medicine

## 2023-08-04 ENCOUNTER — Other Ambulatory Visit: Payer: Self-pay

## 2023-08-04 DIAGNOSIS — R609 Edema, unspecified: Secondary | ICD-10-CM | POA: Diagnosis not present

## 2023-08-04 DIAGNOSIS — M25562 Pain in left knee: Secondary | ICD-10-CM | POA: Diagnosis not present

## 2023-08-04 DIAGNOSIS — Z794 Long term (current) use of insulin: Secondary | ICD-10-CM | POA: Diagnosis not present

## 2023-08-04 DIAGNOSIS — M79605 Pain in left leg: Secondary | ICD-10-CM | POA: Diagnosis not present

## 2023-08-04 DIAGNOSIS — I10 Essential (primary) hypertension: Secondary | ICD-10-CM | POA: Diagnosis not present

## 2023-08-04 DIAGNOSIS — M1712 Unilateral primary osteoarthritis, left knee: Secondary | ICD-10-CM | POA: Diagnosis not present

## 2023-08-04 DIAGNOSIS — R739 Hyperglycemia, unspecified: Secondary | ICD-10-CM | POA: Diagnosis not present

## 2023-08-04 MED ORDER — DICLOFENAC SODIUM 1 % EX GEL: 2.0000 g | Freq: Four times a day (QID) | CUTANEOUS | 0 refills | Status: DC

## 2023-08-04 MED ORDER — KETOROLAC TROMETHAMINE 30 MG/ML IJ SOLN
15.0000 mg | Freq: Once | INTRAMUSCULAR | Status: AC
  Administered 2023-08-04: 15 mg via INTRAVENOUS
  Filled 2023-08-04: qty 1

## 2023-08-04 NOTE — ED Triage Notes (Signed)
 Patient bib [facility] for known dvt started on Xarleto yesterday. Unable to ambulate on leg and numbness and tingling in left foot.

## 2023-08-04 NOTE — Discharge Instructions (Signed)
 Take all medication as directed and follow-up with your physician.  For additional relief use diclofenac  cream which is available over-the-counter on the back of your leg.

## 2023-08-04 NOTE — ED Provider Notes (Signed)
 [place] EMERGENCY DEPARTMENT AT [facility] Provider Note   CSN: [id number] Arrival date & time: 08/04/23  1417     History  Chief Complaint  Patient presents with   DVT    Rita Bass is a 58 y.o. female.  HPI Patient presents with left leg pain.  Patient has a history of DVT diagnosed yesterday via ultrasound.  She was seen, evaluated at our affiliated emergency department.  She notes that she has taken her Eliquis including this morning.  Today she felt severe worsening pain after feeling a pop sensation while ambulating.  Since that time she has had more pain in prior in the back of her leg.  She has been able to bear weight, has difficulty with ambulation as above.  No distal loss of sensation or weakness that is new.    Home Medications Prior to Admission medications   Medication Sig Start Date End Date Taking? Authorizing Provider  Accu-Chek Softclix Lancets lancets USE IN THE [place], AT NOON AND AT BEDTIME 05/06/23   Albin Huh, MD  acetaminophen  (TYLENOL ) 650 MG CR tablet Take 650-1,300 mg by mouth every 8 (eight) hours as needed for pain. Last dose (2) around 0930 am.    [provider]  albuterol  (PROVENTIL ) (5 MG/ML) 0.5% nebulizer solution Take 0.5 mLs (2.5 mg total) by nebulization every 6 (six) hours as needed for wheezing or shortness of breath. Patient not taking: Reported on 07/01/2023 02/25/23   Albin Huh, MD  Blood Glucose Monitoring Suppl DEVI 1 each by Does not apply route in the morning, at noon, and at bedtime. May substitute to any manufacturer covered by patient's insurance. 03/26/23   Albin Huh, MD  Continuous Glucose Sensor (FREESTYLE LIBRE 3 PLUS SENSOR) MISC Change sensor every 15 days. Patient not taking: Reported on 07/16/2023 07/01/23   McDiarmid, Demetra Filter, MD  DULoxetine  (CYMBALTA ) 60 MG capsule Take 1 capsule (60 mg total) by mouth daily. 07/16/23   Albin Huh, MD  empagliflozin  (JARDIANCE ) 25 MG TABS tablet Take 1  tablet (25 mg total) by mouth daily. 06/25/22   Albin Huh, MD  gabapentin  (NEURONTIN ) 100 MG capsule TAKE 4 CAPSULES(400 MG) BY MOUTH THREE TIMES DAILY 03/26/23   Albin Huh, MD  glucose blood (ACCU-CHEK GUIDE) test strip 1 each by Other route daily. for testing 01/16/23   McDiarmid, Demetra Filter, MD  hydrocortisone  (ANUSOL -HC) 2.5 % rectal cream Place rectally as needed for hemorrhoids or anal itching. Patient not taking: Reported on 07/16/2023 04/06/23   [provider]  insulin  glargine (LANTUS ) 100 UNIT/ML Solostar Pen Inject 20 Units into the skin every morning. 07/01/23   McDiarmid, Demetra Filter, MD  Insulin  Pen Needle (PEN NEEDLES) 32G X 5 MM MISC Use as directed with victoza  11/25/20   Hensel, Azucena Bollard, MD  ipratropium-albuterol  (DUONEB) 0.5-2.5 (3) MG/3ML SOLN Take 3 mLs by nebulization every 6 (six) hours as needed. 05/02/23 06/27/23  McDiarmid, Demetra Filter, MD  metFORMIN  (GLUCOPHAGE -XR) 500 MG 24 hr tablet Take 2 tablets (1,000 mg total) by mouth 2 (two) times daily. 09/27/22   Albin Huh, MD  methocarbamol  (ROBAXIN ) 500 MG tablet Take 1 tablet (500 mg total) by mouth every 8 (eight) hours as needed. 06/10/23   Marvel Slicker C, DO  mometasone -formoterol  (DULERA ) 100-5 MCG/ACT AERO Inhale 2 puffs into the lungs daily. Patient not taking: Reported on 07/01/2023 10/22/22   Zheng, Jacky, MD  nystatin  (MYCOSTATIN /NYSTOP ) powder Apply 1 Application topically 2 (two) times daily. Patient not taking: Reported  on 07/01/2023 01/25/23   Albin Huh, MD  olopatadine  (PATANOL) 0.1 % ophthalmic solution Place 1 drop into both eyes 2 (two) times daily. Patient not taking: Reported on 07/16/2023 07/28/21   Espinoza, Alejandra, DO  pantoprazole  (PROTONIX ) 40 MG tablet Take 1 tablet (40 mg total) by mouth daily. 07/16/23   Albin Huh, MD  RIVAROXABAN [place] [employer]) VTE STARTER PACK (15 & 20 MG) Follow package directions: Take one 15mg  tablet by mouth twice a day. On day 22, switch to one 20mg  tablet once a day. Take with food.  08/03/23   Jennings Bureau, PA-C  Semaglutide , 2 MG/DOSE, (OZEMPIC , 2 MG/DOSE,) 8 MG/3ML SOPN Inject 2 mg into the skin once a week. 03/04/23   Albin Huh, MD  spironolactone  (ALDACTONE ) 25 MG tablet Take 1 tablet (25 mg total) by mouth daily. 06/27/23   Jann Melody, MD  traMADol  (ULTRAM ) 50 MG tablet Take 1 tablet (50 mg total) by mouth every 12 (twelve) hours as needed. Not to be refilled before 30 days 07/16/23   Albin Huh, MD  traZODone  (DESYREL ) 50 MG tablet Take 1 tablet (50 mg total) by mouth at bedtime as needed for sleep. 07/16/23   Albin Huh, MD      Allergies    Patient has no known allergies.    Review of Systems   Review of Systems  Physical Exam Updated Vital Signs BP 123/85 (BP Location: Left Arm)   Pulse 87   Temp 97.7 F (36.5 C) (Oral)   Resp 20   Ht 5\' 4"  (1.626 m) Comment: Simultaneous filing. User may not have seen previous data.  Wt (!) 144.2 kg Comment: Simultaneous filing. User may not have seen previous data.  LMP 04/05/2016   SpO2 95%   BMI 54.57 kg/m  Physical Exam Vitals and nursing note reviewed.  Constitutional:      General: She is not in acute distress.    Appearance: She is well-developed. She is obese.  HENT:     Head: Normocephalic and atraumatic.  Eyes:     Conjunctiva/sclera: Conjunctivae normal.  Cardiovascular:     Rate and Rhythm: Normal rate and regular rhythm.     Pulses: Normal pulses.  Pulmonary:     Effort: Pulmonary effort is normal. No respiratory distress.     Breath sounds: Normal breath sounds. No stridor.  Abdominal:     General: There is no distension.  Musculoskeletal:     Comments: Habitus is somewhat prohibitive.  Patient flexes her hip to command and against resistance.  She holds her knee in 180 extension, can extend against mild resistance when thigh is elevated, but has pain doing so.  Ankle, Achilles, foot unremarkable.  Skin:    General: Skin is warm and dry.  Neurological:     Mental  Status: She is alert and oriented to person, place, and time.     Cranial Nerves: No cranial nerve deficit.  Psychiatric:        Mood and Affect: Mood normal.     ED Results / Procedures / Treatments   Labs (all labs ordered are listed, but only abnormal results are displayed) Labs Reviewed - No data to display  EKG None  Radiology DG Knee Complete 4 Views Left Result Date: 08/04/2023 CLINICAL DATA:  Knee pain after pop. EXAM: LEFT KNEE - COMPLETE 4+ VIEW COMPARISON:  Left knee x-ray 07/26/2023 FINDINGS: There is severe medial compartment joint space narrowing with bone-on-bone configuration. There is check compartmental osteophyte formation, but joint  spaces are otherwise well maintained. There is no acute fracture. No significant joint effusion or soft tissue abnormality. IMPRESSION: 1. No acute fracture. 2. Tricompartmental osteoarthrosis, severe in the medial compartment. No acute fracture. Electronically Signed   By: Tyron Gallon M.D.   On: 08/04/2023 15:27   LE VENOUS Result Date: 08/04/2023  Lower Venous DVT Study Patient Name:  GELILA WELL [facility]  Date of Exam:   08/03/2023 Medical Rec #: [id number]              Accession #:    [id number] Date of Birth: 1965/05/31              Patient Gender: F Patient Age:   67 years Exam Location:  [facility] Procedure:      VAS US  LOWER EXTREMITY VENOUS (DVT) Referring Phys: REBECCA RISING --------------------------------------------------------------------------------  Indications: Posterior leg pain X 4 days.  Limitations: Body habitus (BMI 54). Comparison Study: Prior negative left LEV done 04/17/14 Performing Technologist: Carleene Chase RVS  Examination Guidelines: A complete evaluation includes B-mode imaging, spectral Doppler, color Doppler, and power Doppler as needed of all accessible portions of each vessel. Bilateral testing is considered an integral part of a complete examination. Limited examinations for reoccurring indications  may be performed as noted. The reflux portion of the exam is performed with the patient in reverse Trendelenburg.  +-----+---------------+---------+-----------+----------+----------------------+ RIGHTCompressibilityPhasicitySpontaneityPropertiesThrombus Aging         +-----+---------------+---------+-----------+----------+----------------------+ CFV                 Yes      Yes                  patent by color and                                                      Doppler                +-----+---------------+---------+-----------+----------+----------------------+   +---------+---------------+---------+-----------+----------+--------------+ LEFT     CompressibilityPhasicitySpontaneityPropertiesThrombus Aging +---------+---------------+---------+-----------+----------+--------------+ CFV      Full           Yes      Yes                                 +---------+---------------+---------+-----------+----------+--------------+ SFJ      Full                                                        +---------+---------------+---------+-----------+----------+--------------+ FV Prox  Full                                                        +---------+---------------+---------+-----------+----------+--------------+ FV Mid   Full                                                        +---------+---------------+---------+-----------+----------+--------------+  FV DistalFull                                                        +---------+---------------+---------+-----------+----------+--------------+ PFV      Full                                                        +---------+---------------+---------+-----------+----------+--------------+ POP      Full           Yes      Yes                                 +---------+---------------+---------+-----------+----------+--------------+ PTV      None                                          Acute          +---------+---------------+---------+-----------+----------+--------------+ PERO     None                                         Acute          +---------+---------------+---------+-----------+----------+--------------+     Summary: RIGHT: - No evidence of common femoral vein obstruction.   LEFT: - Findings consistent with acute deep vein thrombosis involving the left posterior tibial veins, and left peroneal veins.  - No cystic structure found in the popliteal fossa.  *See table(s) above for measurements and observations. Electronically signed by Runell Countryman on 08/04/2023 at 9:38:52 AM.    Final     Procedures Procedures    Medications Ordered in ED Medications  ketorolac  (TORADOL ) 30 MG/ML injection 15 mg (15 mg Intravenous Given 08/04/23 1527)    ED Course/ Medical Decision Making/ A&P                                 Medical Decision Making Adult female with DVT diagnosis yesterday presents with new left leg pain.  Concern for symptomatic DVT, possible avulsion fracture given her description of a pop while ambulating though she is distally neurovascular intact.  No evidence for phlegmasia. Pulse ox 95% room air normal Cardiac 85 sinus normal  Amount and/or Complexity of Data Reviewed Independent Historian:     Details: Child at bedside External Data Reviewed: notes. Radiology: ordered.    Details: I reviewed yesterday's ultrasound and today's x-ray  Risk Prescription drug management. Diagnosis or treatment significantly limited by social determinants of health.   4:18 PM Patient in no distress, hemodynamically the same, unremarkable, x-ray reviewed, no fracture.  With no evidence for infection, no fracture, distal preserved neurovascular status, patient courage to continue taking her Eliquis, will follow-up with orthopedics or primary care.  Patient provided a walker here.        Final Clinical Impression(s) / ED Diagnoses Final diagnoses:  Left  leg pain  Rx / DC Orders ED Discharge Orders     None         Dorenda Gandy, MD 08/04/23 [phone]

## 2023-08-05 ENCOUNTER — Telehealth: Payer: Self-pay | Admitting: Pharmacist

## 2023-08-05 ENCOUNTER — Ambulatory Visit: Admitting: Pharmacist

## 2023-08-05 DIAGNOSIS — E118 Type 2 diabetes mellitus with unspecified complications: Secondary | ICD-10-CM

## 2023-08-05 MED ORDER — INSULIN GLARGINE 100 UNIT/ML SOLOSTAR PEN
30.0000 [IU] | PEN_INJECTOR | Freq: Every day | SUBCUTANEOUS | Status: DC
Start: 2023-08-05 — End: 2023-08-13

## 2023-08-05 NOTE — Telephone Encounter (Signed)
 Patient contacts office to report she was recently diagnosed with DVT and will NOT be able to make appointment today.   We dicussed her glucose control. She states that her readings are 250-340 mg/dl and she denies any readings < 200.  She reports use of Lantus  (insulin  glargine) at 20 units once daily.  She also reports use of Ozempic  and metformin .   Following discussion, we agreed to increase Lantus  to 30 units once daily.   New appointment made for 1 week at same time as visit with Dr. Jari Merles on 5/27  Total time with patient call and documentation of interaction: 12 minutes.

## 2023-08-05 NOTE — Telephone Encounter (Signed)
 Reviewed and agree with Dr Macky Lower plan.

## 2023-08-08 ENCOUNTER — Telehealth: Payer: Self-pay | Admitting: Infectious Diseases

## 2023-08-08 NOTE — Telephone Encounter (Signed)
 Rita Bass called with concerns about her referral to [facility]. She stated they refused her referral since she recently had a blood clot and is now on blood thinners. Pt also requested a closer facility due to transportation needs. [name] can be reached at [phone], if needed.

## 2023-08-09 ENCOUNTER — Ambulatory Visit (INDEPENDENT_AMBULATORY_CARE_PROVIDER_SITE_OTHER): Admitting: Pain Medicine

## 2023-08-09 VITALS — BP 162/97 | Ht 64.0 in | Wt 318.0 lb

## 2023-08-09 DIAGNOSIS — M170 Bilateral primary osteoarthritis of knee: Secondary | ICD-10-CM | POA: Diagnosis not present

## 2023-08-09 MED ORDER — CYCLOBENZAPRINE HCL 10 MG PO TABS: 10.0000 mg | ORAL_TABLET | Freq: Two times a day (BID) | ORAL | 0 refills | Status: DC | PRN

## 2023-08-09 NOTE — Progress Notes (Signed)
 DATE OF VISIT: 08/09/2023        Rita Bass DOB: [birth year] MRN: [id number]  CC:  bilateral knee pain  History- Rita Bass is a 58 y.o. female in clinic for evaluation and treatment of bilateral knee pain..  Patient arrived today for planned bilateral genicular nerve chemoneurolysis, but last week developed a DVT in her LLE and placed on xarelto .  Pain from DVT has improved, but continues to have knee pain bilaterally as noted previously.   Past Medical History Past Medical History:  Diagnosis Date   Arthritis    Asthma    Chronic pain of left knee    Complication of anesthesia    PONV   Diabetes (HCC)    GERD (gastroesophageal reflux disease)    H/O bronchitis    Hepatic steatosis    Internal hemorrhoids    Kidney stones    Obesity    Renal disorder    Shortness of breath dyspnea    Sleep apnea    past issues - at weight over 400lbs   Sleep apnea in adult    Polysomnogram pending.  Followed by Dr. Matilde Son    Past Surgical History Past Surgical History:  Procedure Laterality Date   BIOPSY  11/01/2020   Procedure: BIOPSY;  Surgeon: Annis Kinder, DO;  Location: [facility];  Service: Gastroenterology;;   BIOPSY  05/11/2021   Procedure: BIOPSY;  Surgeon: Annis Kinder, DO;  Location: [facility];  Service: Gastroenterology;;   BIOPSY  11/29/2022   Procedure: BIOPSY;  Surgeon: Annis Kinder, DO;  Location: [facility];  Service: Gastroenterology;;   CESAREAN SECTION     6 c-sections   CHOLECYSTECTOMY     COLONOSCOPY WITH PROPOFOL  N/A 05/11/2021   Procedure: COLONOSCOPY WITH PROPOFOL ;  Surgeon: Annis Kinder, DO;  Location: [facility];  Service: Gastroenterology;  Laterality: N/A;   ESOPHAGOGASTRODUODENOSCOPY (EGD) WITH PROPOFOL  N/A 11/01/2020   Procedure: ESOPHAGOGASTRODUODENOSCOPY (EGD) WITH PROPOFOL ;  Surgeon: Annis Kinder, DO;  Location: [facility];  Service: Gastroenterology;  Laterality: N/A;    ESOPHAGOGASTRODUODENOSCOPY (EGD) WITH PROPOFOL  N/A 11/29/2022   Procedure: ESOPHAGOGASTRODUODENOSCOPY (EGD) WITH PROPOFOL ;  Surgeon: Annis Kinder, DO;  Location: [facility];  Service: Gastroenterology;  Laterality: N/A;   HERNIA REPAIR     KIDNEY STONE SURGERY     OOPHORECTOMY     TRANSTHORACIC ECHOCARDIOGRAM  09/2017   Technically difficult study.  Did not use Definity  contrast.  EF was 55 to 60% with moderate LVH and grade 1 diastolic dysfunction.  No significant valvular lesions noted.  No regional wall motion normality but difficult to assess due to poor imaging.    TUBAL LIGATION     UPPER GASTROINTESTINAL ENDOSCOPY      Medications Current Outpatient Medications  Medication Sig Dispense Refill   cyclobenzaprine  (FLEXERIL ) 10 MG tablet Take 1 tablet (10 mg total) by mouth 2 (two) times daily as needed for muscle spasms. 60 tablet 0   Accu-Chek Softclix Lancets lancets USE IN THE MORNING, AT NOON AND AT BEDTIME 100 each 12   acetaminophen  (TYLENOL ) 650 MG CR tablet Take 650-1,300 mg by mouth every 8 (eight) hours as needed for pain. Last dose (2) around 0930 am.     albuterol  (PROVENTIL ) (5 MG/ML) 0.5% nebulizer solution Take 0.5 mLs (2.5 mg total) by nebulization every 6 (six) hours as needed for wheezing or shortness of breath. (Patient not taking: Reported on 07/01/2023) 20 mL 0   Blood Glucose Monitoring Suppl DEVI 1 each by  Does not apply route in the morning, at noon, and at bedtime. May substitute to any manufacturer covered by patient's insurance. 1 each 0   Continuous Glucose Sensor (FREESTYLE LIBRE 3 PLUS SENSOR) MISC Change sensor every 15 days. (Patient not taking: Reported on 07/16/2023) 2 each 11   diclofenac  Sodium (VOLTAREN ) 1 % GEL Apply 2 g topically 4 (four) times daily. 100 g 0   DULoxetine  (CYMBALTA ) 60 MG capsule Take 1 capsule (60 mg total) by mouth daily. 30 capsule 3   empagliflozin  (JARDIANCE ) 25 MG TABS tablet Take 1 tablet (25 mg total) by mouth daily. 90  tablet 3   gabapentin  (NEURONTIN ) 100 MG capsule TAKE 4 CAPSULES(400 MG) BY MOUTH THREE TIMES DAILY 360 capsule 2   glucose blood (ACCU-CHEK GUIDE) test strip 1 each by Other route daily. for testing 100 strip 1   hydrocortisone  (ANUSOL -HC) 2.5 % rectal cream Place rectally as needed for hemorrhoids or anal itching. (Patient not taking: Reported on 07/16/2023)     insulin  glargine (LANTUS ) 100 UNIT/ML Solostar Pen Inject 30 Units into the skin daily.     Insulin  Pen Needle (PEN NEEDLES) 32G X 5 MM MISC Use as directed with victoza  30 each 3   ipratropium-albuterol  (DUONEB) 0.5-2.5 (3) MG/3ML SOLN Take 3 mLs by nebulization every 6 (six) hours as needed. 180 mL 0   metFORMIN  (GLUCOPHAGE -XR) 500 MG 24 hr tablet Take 2 tablets (1,000 mg total) by mouth 2 (two) times daily. 360 tablet 3   mometasone -formoterol  (DULERA ) 100-5 MCG/ACT AERO Inhale 2 puffs into the lungs daily. (Patient not taking: Reported on 07/01/2023) 39 g 3   nystatin  (MYCOSTATIN /NYSTOP ) powder Apply 1 Application topically 2 (two) times daily. (Patient not taking: Reported on 07/01/2023) 60 g 2   olopatadine  (PATANOL) 0.1 % ophthalmic solution Place 1 drop into both eyes 2 (two) times daily. (Patient not taking: Reported on 07/16/2023) 5 mL 12   pantoprazole  (PROTONIX ) 40 MG tablet Take 1 tablet (40 mg total) by mouth daily. 90 tablet 3   RIVAROXABAN  (XARELTO ) VTE STARTER PACK (15 & 20 MG) Follow package directions: Take one 15mg  tablet by mouth twice a day. On day 22, switch to one 20mg  tablet once a day. Take with food. 51 each 0   Semaglutide , 2 MG/DOSE, (OZEMPIC , 2 MG/DOSE,) 8 MG/3ML SOPN Inject 2 mg into the skin once a week. 3 mL 5   spironolactone  (ALDACTONE ) 25 MG tablet Take 1 tablet (25 mg total) by mouth daily. 90 tablet 3   traMADol  (ULTRAM ) 50 MG tablet Take 1 tablet (50 mg total) by mouth every 12 (twelve) hours as needed. Not to be refilled before 30 days 60 tablet 0   traZODone  (DESYREL ) 50 MG tablet Take 1 tablet (50 mg  total) by mouth at bedtime as needed for sleep. 30 tablet 3   No current facility-administered medications for this visit.    Allergies has no known allergies.  Family History - reviewed per EMR and intake form  Social History   reports no history of alcohol use.  reports that she has never smoked. She has never been exposed to tobacco smoke. She has never used smokeless tobacco.  reports no history of drug use.  EXAM: Vitals: BP (!) 162/97   Ht 5\' 4"  (1.626 m)   Wt (!) 318 lb (144.2 kg)   LMP 04/05/2016   BMI 54.58 kg/m  General: AOx3, NAD, pleasant SKIN: no rashes or lesions, skin clean, dry, intact  IMAGING: XRAYS: 07/26/2023: Right knee:  Moderate lateral compartment joint space narrowing and peripheral osteophytosis.   Left knee:   Severe medial compartment joint space narrowing and bone-on-bone contact and moderate peripheral osteophytosis. Mild peripheral lateral compartment degenerative osteophytosis. No acute fracture or dislocation.   IMPRESSION: 1. Severe left medial compartment osteoarthritis. 2. Moderate right lateral compartment osteoarthritis.   ASSESSMENT: No diagnosis found.   PLAN: 1. Will hold on genicular chemoneurolysis due to new DVT in LLE.  While Xarelto  is not an absolute contraindication for chemoneurolysis given small needle gauge, we discussed holding off until at least DVT is resolved.  Also, she may be on short course of anticoagulation, but unknown at this time.  She will plan to call and reschedule procedure when DVT resolves. 2. In interim, she was requesting pain medication.  She is currently not on muscle relaxant, so will prescribe 1 month of flexeril  10mg  BID.  The patient will return to see me after DVT resolved., will call sooner with any questions/concerns.   Patient expressed understanding & agreement with above.

## 2023-08-13 ENCOUNTER — Ambulatory Visit (INDEPENDENT_AMBULATORY_CARE_PROVIDER_SITE_OTHER): Admitting: Pharmacist

## 2023-08-13 ENCOUNTER — Encounter: Payer: Self-pay | Admitting: Family Medicine

## 2023-08-13 ENCOUNTER — Encounter: Payer: Self-pay | Admitting: Pharmacist

## 2023-08-13 ENCOUNTER — Ambulatory Visit: Admitting: Family Medicine

## 2023-08-13 VITALS — BP 120/85 | HR 93 | Wt 317.0 lb

## 2023-08-13 VITALS — BP 120/85 | HR 93 | Ht 64.0 in | Wt 317.0 lb

## 2023-08-13 DIAGNOSIS — E118 Type 2 diabetes mellitus with unspecified complications: Secondary | ICD-10-CM

## 2023-08-13 DIAGNOSIS — I82402 Acute embolism and thrombosis of unspecified deep veins of left lower extremity: Secondary | ICD-10-CM | POA: Diagnosis not present

## 2023-08-13 DIAGNOSIS — E785 Hyperlipidemia, unspecified: Secondary | ICD-10-CM

## 2023-08-13 DIAGNOSIS — E1169 Type 2 diabetes mellitus with other specified complication: Secondary | ICD-10-CM | POA: Diagnosis not present

## 2023-08-13 DIAGNOSIS — R1033 Periumbilical pain: Secondary | ICD-10-CM | POA: Diagnosis not present

## 2023-08-13 MED ORDER — EMPAGLIFLOZIN 25 MG PO TABS: 25.0000 mg | ORAL_TABLET | Freq: Every day | ORAL | 3 refills | Status: AC

## 2023-08-13 MED ORDER — INSULIN GLARGINE 100 UNIT/ML SOLOSTAR PEN: 30.0000 [IU] | PEN_INJECTOR | SUBCUTANEOUS | 5 refills | Status: DC

## 2023-08-13 MED ORDER — OZEMPIC (2 MG/DOSE) 8 MG/3ML ~~LOC~~ SOPN: 2.0000 mg | PEN_INJECTOR | SUBCUTANEOUS | 5 refills | Status: DC

## 2023-08-13 MED ORDER — RIVAROXABAN 20 MG PO TABS
20.0000 mg | ORAL_TABLET | Freq: Every day | ORAL | 0 refills | Status: DC
Start: 2023-08-13 — End: 2023-10-14

## 2023-08-13 MED ORDER — PEN NEEDLES 32G X 5 MM MISC: 3 refills | Status: DC

## 2023-08-13 MED ORDER — METFORMIN HCL ER 500 MG PO TB24: 1000.0000 mg | ORAL_TABLET | Freq: Two times a day (BID) | ORAL | 3 refills | Status: AC

## 2023-08-13 MED ORDER — TRAMADOL HCL 50 MG PO TABS: 50.0000 mg | ORAL_TABLET | Freq: Two times a day (BID) | ORAL | 0 refills | Status: DC | PRN

## 2023-08-13 NOTE — Patient Instructions (Signed)
 Good to see you today - Thank you for coming in  Things we discussed today:  1) For your blood clot - Continue taking Xarelto  for a total of 3 months. Finish the pack that you got from the ED, then I sent a prescription for another 60 days.  - I am sending a referral for you to see a Hematologist. They can help decide if we need to take Xarelto  for longer than 3 months.  2) We need to get up-to-date on your cancer screenings.  - Please schedule for a pap smear in the 2 weeks. Let them know that you want a female provider. - Please call the [facility] to get an updated mammogram  [facility] [address] [place],  [place]  [address] [phone]   3) Go to [email or web address] to find a therapist for yourself. I recommend getting started sooner than later to help with stress, sleep, and pain.   Come back to see me in 1-2 months.

## 2023-08-13 NOTE — Progress Notes (Incomplete)
    SUBJECTIVE:   CHIEF COMPLAINT / HPI:   ***    DVT diagnosed   Started Xarelto  5/17  Malignancy? - UTD on colonoscopy 2023  - Mammogram  - Pap smear (reports abnormal pap from [facility] 2022, but not on file)   BG  PERTINENT  PMH / PSH: ***  OBJECTIVE:   LMP 04/05/2016   ***  ASSESSMENT/PLAN:   Assessment & Plan    - Continue Xarelto  treatment for 3 months - Consider Heme/Onc referral   Albin Huh, MD [facility]

## 2023-08-13 NOTE — Patient Instructions (Addendum)
 It was nice to see you today!  Your goal blood sugar is 80-130 before eating and less than 180 after eating.  Medication Changes: Increase Lantus  (insulin  glargine) to 30 units once daily starting tomorrow. Increase by 2 units every day until sugar <150 mg/dL (max of 50 units).   Continue all other medication the same. We have sent refills for all diabetes medications and double checked you have enough sensors at your pharmacy.   Keep up the good work with diet and exercise. Aim for a diet full of vegetables, fruit and lean meats (chicken, [place], fish). Try to limit salt intake by eating fresh or frozen vegetables (instead of canned), rinse canned vegetables prior to cooking and do not add any additional salt to meals.

## 2023-08-13 NOTE — Progress Notes (Signed)
 S:     Chief Complaint  Patient presents with   Medication Management    Diabetes follow-up   58 y.o. female who presents for diabetes evaluation, education, and management. Patient arrives in good spirits and presents with limp due to recent DVT. Patient is accompanied by her daughter, Rita Bass.   Patient was referred and last seen by Primary Care Provider, Dr. Jari Merles, on 07/16/23.   PMH is significant for DVT, diastolic heart failure, OA.  Patient reports Diabetes was diagnosed in 2019.    Current diabetes medications include: Ozempic  (semaglutide ) 2 mg, metformin  1,000 mg BID, Jardiance  (empagliflozin ) 25 mg daily, Lantus  (insulin  glargine) 20 units daily.  Current hypertension medications include: spironolactone  25 mg.  Current hyperlipidemia medications include: none currently.   Patient reports adherence to taking all medications as prescribed.  Insurance coverage: Medicaid  Patient denies hypoglycemic events, and reports significant stress with recent health problems (DVT, recent 'heart blockage').   Patient-reported exercise habits: Patient is somewhat immobile due to weight and DVT.   O:   Review of Systems  Cardiovascular:  Positive for leg swelling.  Musculoskeletal:  Positive for joint pain.  All other systems reviewed and are negative.   Physical Exam Pulmonary:     Effort: Pulmonary effort is normal.  Musculoskeletal:        General: Signs of injury (limp due to DVT) present.  Neurological:     Mental Status: She is alert.  Psychiatric:        Mood and Affect: Mood normal.        Behavior: Behavior normal.        Thought Content: Thought content normal.        Judgment: Judgment normal.     Libre3 CGM Download today 5/14 - 5/27  % Time CGM is active: 96% Average Glucose: 290 mg/dL Glucose Management Indicator: 10.2%  Glucose Variability: 14.9% (goal <36%) Time in Goal:  - Time in range 70-180: 0% - Time above range: 100% (80% very high)  -  Time below range: 0% Observed patterns: Consistently elevated, see some meal-time spikes.   Lab Results  Component Value Date   HGBA1C 10.4 (A) 06/18/2023   Vitals:   08/13/23 1527 08/13/23 1528  BP: (!) 114/93 120/85  Pulse: 92 93  SpO2: 99%     Lipid Panel     Component Value Date/Time   CHOL 192 06/21/2020 1547   TRIG 131 06/21/2020 1547   HDL 54 06/21/2020 1547   CHOLHDL 3.6 06/21/2020 1547   LDLCALC 115 (H) 06/21/2020 1547   LDLDIRECT 117 (H) 02/28/2021 0917    Clinical Atherosclerotic Cardiovascular Disease (ASCVD): Yes due to 'heart blockage.'  The ASCVD Risk score (Arnett DK, et al., 2019) failed to calculate for the following reasons:   Cannot find a previous HDL lab   Cannot find a previous total cholesterol lab   Patient is participating in a Managed Medicaid Plan:  Yes   A/P: Diabetes longstanding since 2019 currently uncontrolled, with A1c of 10.4% (4/30), and GMI of 10.2%. Likely worsened by recent stress. Medication adherence appears good. Control is suboptimal due to inadequate insulin  dosage. Will continue max doses of Jardiance , Ozempic , and metformin . Increase Lantus  (insulin  glargine) dose to 30 units starting tomorrow (5/28) and increase by 2 units every day until BG < 150 (max: 50 units). Next A1c due in August.   ASCVD risk - secondary prevention in patient with diabetes. Last LDL is 115 not at goal of <91  mg/dL. ASCVD risk factors include T2DM, weight, HTN, HLD. History of atorvastatin  40 mg use with inconsistent adherence. High intensity statin indicated. Plan to initiate at next visit.   Hypertension longstanding since 2021 currently uncontrolled. BP today of 114/93 and 120/85 indicates diastolic elevation. Medication adherence good. Continue spironolactone  25 mg daily.   Written patient instructions provided. Patient verbalized understanding of treatment plan.  Total time in face to face counseling 47 minutes.    Follow-up:  Pharmacist on 6/17.   PCP clinic visit in TBD Patient seen with Noah Sw[place]il[place], PharmD Candidate and Volney Grumbles, PharmD, PGY-1 pharmacy resident.

## 2023-08-13 NOTE — Assessment & Plan Note (Signed)
 Diabetes longstanding since 2019 currently uncontrolled, with A1c of 10.4% (4/30), and GMI of 10.2%. Likely worsened by recent stress. Medication adherence appears good. Control is suboptimal due to inadequate insulin  dosage. Will continue max doses of Jardiance , Ozempic , and metformin . Increase Lantus  (insulin  glargine) dose to 30 units starting tomorrow (5/28) and increase by 2 units every day until BG < 150 (max: 50 units). Next A1c due in August.

## 2023-08-13 NOTE — Assessment & Plan Note (Signed)
 ASCVD risk - secondary prevention in patient with diabetes. Last LDL is 115 not at goal of <19 mg/dL. ASCVD risk factors include T2DM, weight, HTN, HLD. History of atorvastatin  40 mg use with inconsistent adherence. High intensity statin indicated. Plan to initiate at next visit.

## 2023-08-14 ENCOUNTER — Ambulatory Visit: Payer: Self-pay | Admitting: Family Medicine

## 2023-08-14 LAB — CBC
Hematocrit: 45.9 % (ref 34.0–46.6)
Hemoglobin: 14.2 g/dL (ref 11.1–15.9)
MCH: 23.7 pg — ABNORMAL LOW (ref 26.6–33.0)
MCHC: 30.9 g/dL — ABNORMAL LOW (ref 31.5–35.7)
MCV: 77 fL — ABNORMAL LOW (ref 79–97)
Platelets: 296 10*3/uL (ref 150–450)
RBC: 5.98 x10E6/uL — ABNORMAL HIGH (ref 3.77–5.28)
RDW: 16.3 % — ABNORMAL HIGH (ref 11.7–15.4)
WBC: 10.7 10*3/uL (ref 3.4–10.8)

## 2023-08-14 NOTE — Progress Notes (Signed)
 Reviewed and agree with Dr Macky Lower plan.

## 2023-08-15 NOTE — Progress Notes (Signed)
    SUBJECTIVE:   CHIEF COMPLAINT / HPI:   DM is a 58yo F w/ hx of T2DM, OA of knees, chronic pain that p/f DVT f/u.  DVT - Seen in ED 5/17 for few days of L leg pain, found to have DVT of L posterior tibial vein and l peroneal veins.  - Pt was started on Xarelto  5/17 - She was originally needing a walker due to pain, but has been able to ambulate normally again andpain is improving.  - Denies any preceding trauma, surgery, immobilizzation, prolonged travel.  - Since then, she has had to put further workup/management of her knee OA w/ sports med and H pylori workup w/ ID/GI on hold. They do not think it is safe to do biopsy/EGD or injections while she is undergoing treatment for DVT. - She still has f/u w/ Cards, who plan to do MRI of heart  Malignancy Screening - UTD on colonoscopy 2023  - Mammogram  - Pap smear (reports abnormal pap from [facility] 2022, but not on file)   OBJECTIVE:   BP 120/85   Pulse 93   Ht 5\' 4"  (1.626 m)   Wt (!) 317 lb (143.8 kg)   LMP 04/05/2016   SpO2 97%   BMI 54.41 kg/m   General: Alert, pleasant woman. NAD. HEENT: NCAT. MMM. CV: RRR, no murmurs.  Resp: CTAB, no wheezing or crackles. Normal WOB on RA.  Abm: Soft, nontender, nondistended. BS present. Ext: Tender in posterior L calf region.  Skin: Warm, well perfused   ASSESSMENT/PLAN:   Assessment & Plan Acute deep vein thrombosis (DVT) of left lower extremity, unspecified vein (HCC) Pain improving w/ Xarelto . No respiratory sxs. This seems to be an unprovoked DVT. Given her multiple co morbidities, I will refer to Heme/Onc for further workup and determination of anticoag duration. Pt started a Xarelto  starter pack on 5/17. - CBC to monitor Hgb on Xarelto  - Prescription sent for Xarelto  20mg  daily for 60 days. Instructed to continue Xarelto  starter pack, then transition directly to 60 more days of 20mg  daily.              - Tentatively planning 3 month anticoag for now, but this may need to be  extended/indefinite. I will defer this decision to Heme - Referral to Heme - Cancer screening             - Will get updated pap and mammogram. UTD on colon cancer screen.     - F/u in 2 wks w/ female provider for pap smear - F/u w/ me in 1 month for routine f/u   Albin Huh, MD [facility]

## 2023-08-18 ENCOUNTER — Other Ambulatory Visit: Payer: Self-pay | Admitting: Family Medicine

## 2023-08-18 DIAGNOSIS — R300 Dysuria: Secondary | ICD-10-CM

## 2023-08-19 ENCOUNTER — Telehealth: Payer: Self-pay

## 2023-08-19 NOTE — Telephone Encounter (Signed)
 Patient calls nurse line requesting medication for leg pain.   She reports that she has been applying OTC voltaren  to left leg for the last few weeks.   She was recently diagnosed with DVT in left leg.   Advised patient that due to her pain being persistent and difficulty with walking that she should receive further evaluation. Patient is agreeable.   Offered appointment for today. Patient declines and requests appointment for tomorrow morning. Scheduled for tomorrow morning with Dr. Mikeal Alder.   Patient did report episode of left sided chest discomfort approx 1 hour ago. She feels that this was related to moving her walker. Advised that patient be seen in the ED if she is having chest pain, shortness of breath or worsening of pain. Patient voices understanding.   Elsie Halo, RN

## 2023-08-20 ENCOUNTER — Encounter: Payer: Self-pay | Admitting: Student

## 2023-08-20 ENCOUNTER — Ambulatory Visit (INDEPENDENT_AMBULATORY_CARE_PROVIDER_SITE_OTHER): Admitting: Student

## 2023-08-20 DIAGNOSIS — R1084 Generalized abdominal pain: Secondary | ICD-10-CM

## 2023-08-20 MED ORDER — GABAPENTIN 100 MG PO CAPS
600.0000 mg | ORAL_CAPSULE | Freq: Three times a day (TID) | ORAL | 2 refills | Status: DC
Start: 2023-08-20 — End: 2023-11-27

## 2023-08-20 NOTE — Patient Instructions (Addendum)
 Pleasure to meet you today.  Suspect your pain is more consistent with post thrombotic syndrome from your left leg blood clot.  To help improve the pain I have increased your gabapentin  to 600 mg 3 times a day.  If you are noticing more daytime sleepiness you could consider just increasing the nighttime dose.  Also you can try to continue using compression socks with your Flexeril  and tramadol  as prescribed.

## 2023-08-20 NOTE — Progress Notes (Signed)
    SUBJECTIVE:   CHIEF COMPLAINT / HPI:  Rita Bass is a 58 year old female with a history of deep vein thrombosis who presents with persistent leg pain and swelling. She is accompanied by her daughter, Serena.  Severe pain in the left leg has persisted since a DVT diagnosis on Aug 04, 2023. The pain is located in the back of the leg, particularly at the bend, and worsens with walking. She requires a walker at home due to difficulty walking. Swelling in the leg accompanies the pain, and medications have not provided relief.  Current pain management includes Voltaren  cream, gabapentin  (400 mg every eight hours), tramadol  (50 mg every twelve hours), and duloxetine  (60 mg daily), all of which are ineffective. Additional symptoms include numbness in the foot and toes, a popping sensation in the back of the leg followed by severe pain, visible blue veins, and significant swelling.  Daily activities are challenging due to leg pain and weakness.  PERTINENT  PMH / PSH: Reviewed   OBJECTIVE:   BP 132/86   Pulse (!) 106   Ht 5\' 4"  (1.626 m)   Wt (!) 315 lb 9.6 oz (143.2 kg)   LMP 04/05/2016   SpO2 97%   BMI 54.17 kg/m    Physical Exam General: Alert, morbidly obese, on wheel chair, NAD Cardiovascular: RRR, No Murmurs, Normal S2/S2 Respiratory: CTAB, No wheezing or Rales Abdomen: No distension or tenderness Extremities: Had to determine edema due to body habitus, Had tenderness over the left Calf  ASSESSMENT/PLAN:   Post-thrombotic syndrome Severe left leg pain and swelling post-DVT, consistent with post-thrombotic syndrome. Pain persists despite current medications. Mobility impaired, requiring assistive devices. Conservative management recommended. - Increase gabapentin  dosage to 600 TID - Continue Xarelto , tramadol , duloxetine , and Voltaren  cream. - Advise against excessive massage to prevent clot dislodgement. - Consider light compression stockings  Goble Last,  MD [facility]

## 2023-08-29 ENCOUNTER — Ambulatory Visit: Admitting: Student

## 2023-08-29 VITALS — BP 124/84 | HR 97 | Ht 64.0 in | Wt 321.0 lb

## 2023-08-29 DIAGNOSIS — M79605 Pain in left leg: Secondary | ICD-10-CM

## 2023-08-29 DIAGNOSIS — I82442 Acute embolism and thrombosis of left tibial vein: Secondary | ICD-10-CM | POA: Diagnosis not present

## 2023-08-29 DIAGNOSIS — M79604 Pain in right leg: Secondary | ICD-10-CM

## 2023-08-29 MED ORDER — TRAMADOL HCL 50 MG PO TABS: 50.0000 mg | ORAL_TABLET | Freq: Four times a day (QID) | ORAL | 0 refills | Status: AC | PRN

## 2023-08-29 NOTE — Patient Instructions (Signed)
 It was great to see you! Thank you for allowing me to participate in your care!   I recommend that you always bring your medications to each appointment as this makes it easy to ensure we are on the correct medications and helps us  not miss when refills are needed.  Our plans for today:  - You can use compression stockings on both legs to help with swelling and pain. - I have prescribed 7 days worth of tramadol  that you can take in between your twice daily dosing for added pain control. - Unfortunately, our vascular lab is closed tonight.  We will call first in the morning, and schedule these for you.  You will then be notified of the time and date.  We have ordered the stat and hopefully will have it done very quickly. - If you develop shortness of breath, severe pain, chest pain please go to the emergency room    Take care and seek immediate care sooner if you develop any concerns. Please remember to show up 15 minutes before your scheduled appointment time!  Lavada Porteous, DO [facility]

## 2023-08-29 NOTE — Progress Notes (Signed)
    SUBJECTIVE:   CHIEF COMPLAINT / HPI:   Bilateral leg pain  Diagnosed with DVT of left posterior tibial vein on 5/17.  Started on Xarelto , currently taking 20 mg Xarelto  daily. Initially had severe pain and unable to ambulate, however this was improving on 08/13/2023 when she saw us  in clinic.  She returned for follow-up on 08/20/2023 with concern of severe pain in left leg.  The pain is located in the back the leg, particular the bend, is worsening with walking.  She was using assistive walking devices at that time. At that time gabapentin  was increased to 600 mg 3 times daily.  She was continued on Xarelto , tramadol , duloxetine  and Voltaren  cream.  She was advised against excessive massage to prevent clot dislodgment and encouraged to wear light compression stockings.  Today patient reports worsening left leg pain.  Additionally, swelling of her right lower extremity and pain.  She is able to use walker to ambulate 2/2 pain.  OBJECTIVE:   BP 124/84   Pulse 97   Ht 5' 4 (1.626 m)   Wt (!) 321 lb (145.6 kg)   LMP 04/05/2016   SpO2 97%   BMI 55.10 kg/m    General: NAD, well-appearing, well-nourished Respiratory: No respiratory distress, breathing comfortably, able to speak in full sentences Skin: warm and dry, no rashes noted on exposed skin Psych: Appropriate affect and mood Extremities: Bilateral trace edema, varicose veins.  Tenderness to popliteal fossa bilaterally.  No evidence of infection.   ASSESSMENT/PLAN:   Assessment & Plan Bilateral leg pain Bilateral leg pain in setting of left DVT being treated with Xarelto .  Differential includes post embolic syndrome, however with new onset of right-sided leg pain will rule out DVT on the right.  Presence of new DVT, would raise concern for Xarelto  failure. - Staff notified to call for vascular ultrasound first thing in the morning, as vascular sound is currently closed.  Stat DVT ultrasound bilateral - Recommend compression  stockings - Prescribed 7 pills of tramadol  to take in between her twice daily tramadol  regimen for additional pain control - Will call with DVT ultrasound results and schedule follow-up   Lavada Porteous, DO [facility]

## 2023-08-30 ENCOUNTER — Telehealth: Payer: Self-pay

## 2023-08-30 ENCOUNTER — Ambulatory Visit (HOSPITAL_COMMUNITY)
Admission: RE | Admit: 2023-08-30 | Discharge: 2023-08-30 | Disposition: A | Discharge status: Home / Self Care | Source: Ambulatory Visit | Attending: Family Medicine | Admitting: Family Medicine

## 2023-08-30 ENCOUNTER — Telehealth: Payer: Self-pay | Admitting: Student

## 2023-08-30 ENCOUNTER — Ambulatory Visit: Payer: Self-pay | Admitting: Student

## 2023-08-30 DIAGNOSIS — M79604 Pain in right leg: Secondary | ICD-10-CM

## 2023-08-30 DIAGNOSIS — M79605 Pain in left leg: Secondary | ICD-10-CM | POA: Diagnosis not present

## 2023-08-30 DIAGNOSIS — I82442 Acute embolism and thrombosis of left tibial vein: Secondary | ICD-10-CM

## 2023-08-30 NOTE — Telephone Encounter (Signed)
 Called patient, confirmed identity.  Discussed DVT ultrasound results, no new DVT.  Cystic structure in popliteal fossa of right extremity, likely a Baker's cyst.  Although patient is endorsing pain, I do not believe the Baker's cyst is the cause.  Additionally, Baker's cyst is likely related to her knee arthritis-could consider corticosteroid injections, however appears that these were not as effective for her.  She follows with sports medicine.  Encouraged patient to continue her compression stockings.  Additionally, she reports the extra tramadol  has helped with her pain and function.  Plan to continue Xarelto  and management above.  Recommend patient calls for appointment if symptoms worsen or fail to improve over the next few weeks. Patient expressed understanding and agreed with plan.

## 2023-08-30 NOTE — Telephone Encounter (Signed)
 Vein [facility] V[facility] calls nurse line to give results.   She reports no new DVT, however positive from previous DVT a few weeks ago.   Baker Cyst noted on exam.  Patient is on Xarelto  for previous DVT. Patient ok to leave with no new findings and on therapy.   Will forward to provider who saw patient for concern.   Patient has pharmacy apt scheduled for 6/17.

## 2023-09-02 ENCOUNTER — Other Ambulatory Visit: Payer: Self-pay | Admitting: Gastroenterology

## 2023-09-03 ENCOUNTER — Ambulatory Visit: Admitting: Pharmacist

## 2023-09-03 ENCOUNTER — Telehealth: Payer: Self-pay

## 2023-09-03 ENCOUNTER — Ambulatory Visit: Admitting: Family Medicine

## 2023-09-03 NOTE — Telephone Encounter (Signed)
 [facility] calls nurse line requesting surgical clearance form.   She reports this was faxed to our office last week.   She reports they are unable to schedule her procedure until clearance is returned.   She requests Xarelto  hold directions.   Advised to fax again in case this has not been received. Patient has an apt today with PCP.   Will forward to make aware.

## 2023-09-09 ENCOUNTER — Ambulatory Visit: Admitting: Pharmacist

## 2023-09-13 ENCOUNTER — Encounter: Payer: Self-pay | Admitting: Family Medicine

## 2023-09-13 ENCOUNTER — Ambulatory Visit (INDEPENDENT_AMBULATORY_CARE_PROVIDER_SITE_OTHER): Admitting: Family Medicine

## 2023-09-13 ENCOUNTER — Ambulatory Visit (INDEPENDENT_AMBULATORY_CARE_PROVIDER_SITE_OTHER): Admitting: Pharmacist

## 2023-09-13 VITALS — BP 152/96 | HR 96 | Wt 315.0 lb

## 2023-09-13 DIAGNOSIS — E118 Type 2 diabetes mellitus with unspecified complications: Secondary | ICD-10-CM

## 2023-09-13 DIAGNOSIS — R1033 Periumbilical pain: Secondary | ICD-10-CM | POA: Diagnosis not present

## 2023-09-13 DIAGNOSIS — M170 Bilateral primary osteoarthritis of knee: Secondary | ICD-10-CM

## 2023-09-13 DIAGNOSIS — Z794 Long term (current) use of insulin: Secondary | ICD-10-CM | POA: Diagnosis not present

## 2023-09-13 DIAGNOSIS — E1165 Type 2 diabetes mellitus with hyperglycemia: Secondary | ICD-10-CM | POA: Diagnosis not present

## 2023-09-13 MED ORDER — CYCLOBENZAPRINE HCL 10 MG PO TABS: 10.0000 mg | ORAL_TABLET | Freq: Two times a day (BID) | ORAL | 0 refills | Status: DC | PRN

## 2023-09-13 MED ORDER — TRAMADOL HCL 50 MG PO TABS: 50.0000 mg | ORAL_TABLET | Freq: Two times a day (BID) | ORAL | 0 refills | Status: DC | PRN

## 2023-09-13 NOTE — Patient Instructions (Signed)
 It was nice to see you today!  Changes for your insulin  per Dr. Elicia.   Your goal blood sugar is 80-130 before eating and less than 180 after eating.  Medication Changes: Continue all other medication the same.   Keep up the good work with diet and exercise. Aim for a diet full of vegetables, fruit and lean meats (chicken, [place], fish). Try to limit salt intake by eating fresh or frozen vegetables (instead of canned), rinse canned vegetables prior to cooking and do not add any additional salt to meals.   Next visit with me in 3-4 weeks to reevaluate your pain.

## 2023-09-13 NOTE — Assessment & Plan Note (Signed)
 Diabetes longstanding currently remains with suboptimal control due to inability to tolerate prescribed increase in therapy at last visit.  Patient is able to verbalize appropriate hypoglycemia management plan.  - Attempt to increase Basal insulin  - as discussed with PCP, Dr. Elicia today.  -Continue metformin , Continue Jardiance .

## 2023-09-13 NOTE — Patient Instructions (Signed)
 Good to see you today - Thank you for coming in  Things we discussed today:  1) Please call and schedule with the Hematologist. They can tell us  how long you need to take your Xarelto . They can also help us  figure out when it is safe to get your EGD.  [facility] [address] [place], [place] [address] [phone]  2) For your insulin  - Increase Lantus  to 32 units - Increase by 2 units every day until sugar <150 mg/dL (max of 50 units).   3)    Come back to see me in 4 weeks.

## 2023-09-13 NOTE — Progress Notes (Signed)
    S:     Chief Complaint  Patient presents with   Medication Management    Diabetes   58 y.o. female who presents for diabetes evaluation, education, and management. Patient is accompanied by her daughter, Samule.    Patient is also being seen by Primary Care Provider, Dr. Elicia, today.    PMH is significant for DVT, diastolic heart failure, OA.  Patient reports Diabetes was diagnosed in 2019.     Current diabetes medications include: Ozempic  (semaglutide ) 2 mg, metformin  1,000 mg BID, Jardiance  (empagliflozin ) 25 mg daily, Lantus  (insulin  glargine) 30 units daily.  Current hypertension medications include: spironolactone  25 mg.  Current hyperlipidemia medications include: none currently.    States unable to increase Lantus  insulin  gradually due to headache.  O:   Review of Systems  Musculoskeletal:  Positive for joint pain.  All other systems reviewed and are negative.   Physical Exam Vitals reviewed.  Constitutional:      Appearance: Normal appearance.  Pulmonary:     Effort: Pulmonary effort is normal.   Neurological:     Mental Status: She is alert.   Psychiatric:        Mood and Affect: Mood normal.        Thought Content: Thought content normal.        Judgment: Judgment normal.     Libre3 CGM Download today but only used for last 4 days % Time CGM is active: <50% Average Glucose: 324mg /dL Glucose Management Indicator:   Glucose Variability: 20% (goal <36%) Time in Goal:  - Time in range 70-180: 1% - Time above range: 99% - Time below range: 0% Observed patterns:  Lab Results  Component Value Date   HGBA1C 10.4 (A) 06/18/2023    A/P: Diabetes longstanding currently remains with suboptimal control due to inability to tolerate prescribed increase in therapy at last visit.  Patient is able to verbalize appropriate hypoglycemia management plan.  - Attempt to increase Basal insulin  - as discussed with PCP, Dr. Elicia today.  -Continue metformin ,  Continue Jardiance .   Written patient instructions provided. Patient verbalized understanding of treatment plan.  Total time in face to face counseling 12 minutes.    Follow-up:  Pharmacist 4-6 weeks per PCP

## 2023-09-13 NOTE — Progress Notes (Unsigned)
    SUBJECTIVE:   CHIEF COMPLAINT / HPI:   DM is a 58yo F w/ hx of T2DM, DVT on Xarelto , BL knee OA that pf T2dm f/u.  T2DM - Pt tried titrating her insulin  up but reported having headaches so she went back to taking 30u basal daily - CGM has shown consistently >300 BG.  BL Knee OA - Still having a lot of BL knee pain - needs refill on tramadol  and flexeril  - She asks if it is safe to return to physical therapy    OBJECTIVE:   BP (!) 152/96   Pulse 96   Wt (!) 315 lb (142.9 kg)   LMP 04/05/2016   SpO2 96%   BMI 54.07 kg/m   General: Alert woman. NAD. HEENT: NCAT. MMM. CV: RRR, no murmurs.  Resp: CTAB, no wheezing or crackles. Normal WOB on RA.   Ext: Moves all ext spontaneously Skin: Warm, well perfused   ASSESSMENT/PLAN:   Assessment & Plan Osteoarthritis of both knees, unspecified osteoarthritis type Chronic and uncontrolled. Provided letter for PT to pt to continue PT, just avoiding massages or similar procedures to her L leg given her DVT Type 2 diabetes mellitus with hyperglycemia, with long-term current use of insulin  (HCC) Chronic, uncontrolled. Counseled need to increase insulin  and pt is agreeable to trying. - Increase Lantus  to 32 daily. Increase by 2units daily if fasting BG is >150. Do not increase above 50u.   Provided info to contact Heme/Onc for her DVT. I would like their input on when pt can stop xarelto  and have her EGD.   [name], MD [facility]

## 2023-09-16 NOTE — Progress Notes (Signed)
 Reviewed and agree with Dr Macky Lower plan.

## 2023-09-19 ENCOUNTER — Other Ambulatory Visit: Payer: Self-pay | Admitting: Family Medicine

## 2023-09-19 ENCOUNTER — Telehealth: Payer: Self-pay

## 2023-09-19 ENCOUNTER — Other Ambulatory Visit: Payer: Self-pay

## 2023-09-19 DIAGNOSIS — R21 Rash and other nonspecific skin eruption: Secondary | ICD-10-CM

## 2023-09-19 DIAGNOSIS — I82402 Acute embolism and thrombosis of unspecified deep veins of left lower extremity: Secondary | ICD-10-CM

## 2023-09-19 MED ORDER — NYSTATIN 100000 UNIT/GM EX POWD: 1.0000 | Freq: Two times a day (BID) | CUTANEOUS | 5 refills | Status: AC

## 2023-09-19 NOTE — Telephone Encounter (Signed)
 Patient calls nurse line regarding hematology/ oncology referral.   She states that the soonest that she was able to schedule is not until 11/19/23.  Spoke with Dr. Elicia regarding concern. He would like patient to be seen sooner than September, preferably within the next month. Referral may also need to be updated to urgent given this timeline.    Will forward to referral coordinator to see if we have any other options.   Chiquita JAYSON English, RN

## 2023-09-24 ENCOUNTER — Telehealth: Payer: Self-pay

## 2023-09-24 ENCOUNTER — Telehealth (HOSPITAL_COMMUNITY): Payer: Self-pay | Admitting: Emergency Medicine

## 2023-09-24 NOTE — Telephone Encounter (Signed)
 Received message from Rita Bass: Per email from [facility]. This patient's Cardiac PET is not covered under her insurance. It will need to be cancelled. Thanks .    Spoke with patient and made her aware. She verbalized understanding. She asked if she will need to keep F/U appt with APP on 09/27/23 if not having PET scan tomorrow.  Will forward to Dr. Santo to review and advise.

## 2023-09-24 NOTE — Telephone Encounter (Signed)
 Attempted to call patient regarding upcoming cardiac CT appointment. Left message on voicemail with name and callback number Rockwell Alexandria RN Navigator Cardiac Imaging [facility] [phone] Office [phone] Cell

## 2023-09-24 NOTE — Telephone Encounter (Signed)
 Spoke with patient and discussed response from Dr. Santo.  Patient states she spoke with her insurance company and states they were going to reach out to our office.  Instructed patient to keep appt on 09/27/23. Patient is concerned as she was told she has a DVT in her leg and started on Xarelto . Patient states she will keep appt on 7/11.

## 2023-09-25 ENCOUNTER — Encounter (HOSPITAL_COMMUNITY): Payer: Self-pay

## 2023-09-25 ENCOUNTER — Ambulatory Visit (HOSPITAL_COMMUNITY): Admission: RE | Admit: 2023-09-25 | Source: Ambulatory Visit

## 2023-09-25 ENCOUNTER — Emergency Department (HOSPITAL_COMMUNITY)
Admission: EM | Admit: 2023-09-25 | Discharge: 2023-09-25 | Disposition: A | Discharge status: Home / Self Care | Attending: Emergency Medicine | Admitting: Emergency Medicine

## 2023-09-25 ENCOUNTER — Emergency Department (HOSPITAL_COMMUNITY)

## 2023-09-25 ENCOUNTER — Other Ambulatory Visit: Payer: Self-pay

## 2023-09-25 DIAGNOSIS — E1165 Type 2 diabetes mellitus with hyperglycemia: Secondary | ICD-10-CM | POA: Diagnosis not present

## 2023-09-25 DIAGNOSIS — M79605 Pain in left leg: Secondary | ICD-10-CM | POA: Diagnosis not present

## 2023-09-25 DIAGNOSIS — Z794 Long term (current) use of insulin: Secondary | ICD-10-CM | POA: Diagnosis not present

## 2023-09-25 DIAGNOSIS — J45909 Unspecified asthma, uncomplicated: Secondary | ICD-10-CM | POA: Insufficient documentation

## 2023-09-25 DIAGNOSIS — Z7901 Long term (current) use of anticoagulants: Secondary | ICD-10-CM | POA: Insufficient documentation

## 2023-09-25 DIAGNOSIS — M79662 Pain in left lower leg: Secondary | ICD-10-CM | POA: Diagnosis not present

## 2023-09-25 DIAGNOSIS — Z7984 Long term (current) use of oral hypoglycemic drugs: Secondary | ICD-10-CM | POA: Diagnosis not present

## 2023-09-25 LAB — CBC
HCT: 44.7 % (ref 36.0–46.0)
Hemoglobin: 13.8 g/dL (ref 12.0–15.0)
MCH: 23.2 pg — ABNORMAL LOW (ref 26.0–34.0)
MCHC: 30.9 g/dL (ref 30.0–36.0)
MCV: 75 fL — ABNORMAL LOW (ref 80.0–100.0)
Platelets: 307 K/uL (ref 150–400)
RBC: 5.96 MIL/uL — ABNORMAL HIGH (ref 3.87–5.11)
RDW: 16.6 % — ABNORMAL HIGH (ref 11.5–15.5)
WBC: 9.1 K/uL (ref 4.0–10.5)
nRBC: 0 % (ref 0.0–0.2)

## 2023-09-25 LAB — BASIC METABOLIC PANEL WITH GFR
Anion gap: 16 — ABNORMAL HIGH (ref 5–15)
BUN: 11 mg/dL (ref 6–20)
CO2: 24 mmol/L (ref 22–32)
Calcium: 9.7 mg/dL (ref 8.9–10.3)
Chloride: 97 mmol/L — ABNORMAL LOW (ref 98–111)
Creatinine, Ser: 0.61 mg/dL (ref 0.44–1.00)
GFR, Estimated: >60 mL/min (ref >60)
Glucose, Bld: 252 mg/dL — ABNORMAL HIGH (ref 70–99)
Potassium: 4.3 mmol/L (ref 3.5–5.1)
Sodium: 137 mmol/L (ref 135–145)

## 2023-09-25 MED ORDER — DICLOFENAC SODIUM 1 % EX GEL: 2.0000 g | Freq: Four times a day (QID) | CUTANEOUS | 0 refills | Status: AC

## 2023-09-25 NOTE — ED Provider Notes (Signed)
 [place] [facility] AT [facility] Provider Note   CSN: [id number] Arrival date & time: 09/25/23  1335     Patient presents with: Leg Pain   Rita Bass is a 58 y.o. female.    Leg Pain Patient is a 58 year old female presents the ED today with concerns for left posterior leg pain that has been present intermittently for the last several weeks, currently being treated for blood clot in left lower extremity with Xarelto .  Denies any missed doses.  Describes the pain as crampy.  Has been ambulatory until today where she notes that she does have pain with walking at this time.  Noted to have a previous medical history of H. pylori, not treated as was simultaneously diagnosed with DVT and required treatment first.  Currently taking PPI.  Previous medical history of asthma, renal disorder, DVT, GERD, diabetes.  Denies headache, vision changes, chest pain, shortness of breath, abdominal pain, nausea, vomiting, dysuria, unilateral weakness, lower extremity swelling.     Prior to Admission medications   Medication Sig Start Date End Date Taking? Authorizing Provider  Accu-Chek Softclix Lancets lancets USE IN THE [place], AT NOON AND AT BEDTIME Patient not taking: Reported on 09/13/2023 05/06/23   [name], MD  acetaminophen  (TYLENOL ) 650 MG CR tablet Take 650-1,300 mg by mouth every 8 (eight) hours as needed for pain. Last dose (2) around 0930 am.    [provider]  albuterol  (PROVENTIL ) (5 MG/ML) 0.5% nebulizer solution Take 0.5 mLs (2.5 mg total) by nebulization every 6 (six) hours as needed for wheezing or shortness of breath. Patient not taking: Reported on 09/13/2023 02/25/23   [name], MD  Blood Glucose Monitoring Suppl DEVI 1 each by Does not apply route in the morning, at noon, and at bedtime. May substitute to any manufacturer covered by patient's insurance. 03/26/23   [name], MD  Continuous Glucose Receiver (FREESTYLE LIBRE 3  READER) DEVI 1 Device as directed. 07/04/23   [provider]  Continuous Glucose Sensor (FREESTYLE LIBRE 3 PLUS SENSOR) MISC Change sensor every 15 days. 07/01/23   McDiarmid, Krystal BIRCH, MD  cyclobenzaprine  (FLEXERIL ) 10 MG tablet Take 1 tablet (10 mg total) by mouth 2 (two) times daily as needed for muscle spasms. 09/13/23   [name], MD  diclofenac  Sodium (VOLTAREN ) 1 % GEL Apply 2 g topically 4 (four) times daily. 09/25/23   Beola Terrall RAMAN, PA-C  DULoxetine  (CYMBALTA ) 60 MG capsule Take 1 capsule (60 mg total) by mouth daily. 07/16/23   [name], MD  empagliflozin  (JARDIANCE ) 25 MG TABS tablet Take 1 tablet (25 mg total) by mouth daily. 08/13/23   McDiarmid, Krystal BIRCH, MD  gabapentin  (NEURONTIN ) 100 MG capsule Take 6 capsules (600 mg total) by mouth 3 (three) times daily. 08/20/23   Rosendo Rush, MD  glucose blood (ACCU-CHEK GUIDE) test strip 1 each by Other route daily. for testing 01/16/23   McDiarmid, Krystal BIRCH, MD  hydrocortisone  (ANUSOL -HC) 2.5 % rectal cream Place rectally as needed for hemorrhoids or anal itching. Patient not taking: Reported on 09/13/2023 04/06/23   [provider]  insulin  glargine (LANTUS ) 100 UNIT/ML Solostar Pen Inject 30-50 Units into the skin every morning. Inject 30 units subcutaneously every morning. Increase by 2 units each day until sugar < 150. Maximum daily dose 50 units. 08/13/23   McDiarmid, Krystal BIRCH, MD  Insulin  Pen Needle (PEN NEEDLES) 32G X 5 MM MISC Use as directed 08/13/23   McDiarmid, Krystal BIRCH, MD  ipratropium-albuterol  (DUONEB) 0.5-2.5 (3) MG/3ML SOLN Take 3 mLs by nebulization every 6 (six) hours as needed. Patient not taking: Reported on 09/13/2023 05/02/23 06/27/23  McDiarmid, Krystal BIRCH, MD  metFORMIN  (GLUCOPHAGE -XR) 500 MG 24 hr tablet Take 2 tablets (1,000 mg total) by mouth 2 (two) times daily. 08/13/23   McDiarmid, Krystal BIRCH, MD  mometasone -formoterol  (DULERA ) 100-5 MCG/ACT AERO Inhale 2 puffs into the lungs daily. Patient not taking: Reported on  09/13/2023 10/22/22   Zheng, Jacky, MD  nystatin  (MYCOSTATIN /NYSTOP ) powder Apply 1 Application topically 2 (two) times daily. 09/19/23   [name], MD  olopatadine  (PATANOL) 0.1 % ophthalmic solution Place 1 drop into both eyes 2 (two) times daily. 07/28/21   Espinoza, Alejandra, DO  pantoprazole  (PROTONIX ) 40 MG tablet Take 1 tablet (40 mg total) by mouth daily. Patient needs follow up appointment for future refills. Please call [phone] to schedule an appointment. 09/02/23   Cirigliano, Vito V, DO  rivaroxaban  (XARELTO ) 20 MG TABS tablet Take 1 tablet (20 mg total) by mouth daily with supper. With a meal 08/13/23 10/12/23  [name], MD  Semaglutide , 2 MG/DOSE, (OZEMPIC , 2 MG/DOSE,) 8 MG/3ML SOPN Inject 2 mg into the skin once a week. 08/13/23   McDiarmid, Krystal BIRCH, MD  spironolactone  (ALDACTONE ) 25 MG tablet Take 1 tablet (25 mg total) by mouth daily. 06/27/23   Santo Stanly LABOR, MD  traMADol  (ULTRAM ) 50 MG tablet Take 1 tablet (50 mg total) by mouth every 12 (twelve) hours as needed. Not to be refilled before 30 days 09/13/23   [name], MD  traZODone  (DESYREL ) 50 MG tablet Take 1 tablet (50 mg total) by mouth at bedtime as needed for sleep. 07/16/23   [name], MD    Allergies: Patient has no known allergies.    Review of Systems  Musculoskeletal:  Positive for myalgias.  All other systems reviewed and are negative.   Updated Vital Signs BP (!) 139/92 (BP Location: Right Wrist)   Pulse 99   Temp 98.1 F (36.7 C)   Resp 18   Ht 5' 4 (1.626 m)   Wt (!) 147.4 kg   LMP 04/05/2016   SpO2 94%   BMI 55.79 kg/m   Physical Exam Vitals and nursing note reviewed.  Constitutional:      General: She is not in acute distress.    Appearance: Normal appearance. She is obese. She is not ill-appearing or diaphoretic.  HENT:     Head: Normocephalic and atraumatic.     Mouth/Throat:     Mouth: Mucous membranes are moist.     Pharynx: Oropharynx is clear. No oropharyngeal exudate  or posterior oropharyngeal erythema.  Eyes:     General:        Right eye: No discharge.        Left eye: No discharge.     Extraocular Movements: Extraocular movements intact.     Conjunctiva/sclera: Conjunctivae normal.  Cardiovascular:     Rate and Rhythm: Normal rate and regular rhythm.     Pulses: Normal pulses.     Heart sounds: Normal heart sounds. No murmur heard.    No friction rub. No gallop.  Pulmonary:     Effort: Pulmonary effort is normal. No respiratory distress.     Breath sounds: Normal breath sounds.  Abdominal:     General: Abdomen is flat. There is no distension.     Palpations: Abdomen is soft.     Tenderness: There is no abdominal tenderness. There is no guarding.  Musculoskeletal:        General: Tenderness (Tenderness noted to the posterior leg across the musculature of right hamstring, radiating to right calf.) present. No swelling, deformity or signs of injury. Normal range of motion.     Cervical back: Normal range of motion. No rigidity.     Right lower leg: No edema.     Left lower leg: No edema.  Skin:    General: Skin is warm and dry.     Coloration: Skin is not pale.     Findings: No bruising, erythema, lesion or rash.     Comments: Noted to have good DP pulse, 2+ as well as posterior tibialis.  No ecchymosis or swelling noted to left lower extremity.  Neurological:     General: No focal deficit present.     Mental Status: She is alert and oriented to person, place, and time. Mental status is at baseline.     Sensory: No sensory deficit.     Motor: No weakness.     Gait: Gait normal.  Psychiatric:        Mood and Affect: Mood normal.     (all labs ordered are listed, but only abnormal results are displayed) Labs Reviewed  CBC - Abnormal; Notable for the following components:      Result Value   RBC 5.96 (*)    MCV 75.0 (*)    MCH 23.2 (*)    RDW 16.6 (*)    All other components within normal limits  BASIC METABOLIC PANEL WITH GFR -  Abnormal; Notable for the following components:   Chloride 97 (*)    Glucose, Bld 252 (*)    Anion gap 16 (*)    All other components within normal limits    EKG: None  Radiology: VAS US  LOWER EXTREMITY VENOUS (DVT) (7a-7p) Result Date: 09/25/2023  Lower Venous DVT Study Patient Name:  Rita Bass [facility]  Date of Exam:   09/25/2023 Medical Rec #: [id number]              Accession #:    [id number] Date of Birth: [birth year]              Patient Gender: F Patient Age:   64 years Exam Location:  [facility] Procedure:      VAS US  LOWER EXTREMITY VENOUS (DVT) Referring Phys: [facility] SMOOT --------------------------------------------------------------------------------  Indications: Pain.  Risk Factors: DVT Acute DVT of LLE on 08/03/2023). Anticoagulation: Xarelto . Limitations: Body habitus and poor ultrasound/tissue interface. Comparison Study: Previous exam on 08/30/2023 was positive for age indeterminate                   DVT of LLE PTV Performing Technologist: Ezzie Potters RVT, RDMS  Examination Guidelines: A complete evaluation includes B-mode imaging, spectral Doppler, color Doppler, and power Doppler as needed of all accessible portions of each vessel. Bilateral testing is considered an integral part of a complete examination. Limited examinations for reoccurring indications may be performed as noted. The reflux portion of the exam is performed with the patient in reverse Trendelenburg.  +-----+---------------+---------+-----------+----------+--------------+ RIGHTCompressibilityPhasicitySpontaneityPropertiesThrombus Aging +-----+---------------+---------+-----------+----------+--------------+ CFV  Full           Yes      Yes                                 +-----+---------------+---------+-----------+----------+--------------+   +---------+---------------+---------+-----------+----------+-------------------+ LEFT     CompressibilityPhasicitySpontaneityPropertiesThrombus Aging       +---------+---------------+---------+-----------+----------+-------------------+  CFV      Full           Yes      Yes                                      +---------+---------------+---------+-----------+----------+-------------------+ SFJ      Full                                                             +---------+---------------+---------+-----------+----------+-------------------+ FV Prox  Full           Yes      Yes                                      +---------+---------------+---------+-----------+----------+-------------------+ FV Mid   Full           Yes      Yes                                      +---------+---------------+---------+-----------+----------+-------------------+ FV DistalFull           Yes      Yes                                      +---------+---------------+---------+-----------+----------+-------------------+ PFV      Full                                                             +---------+---------------+---------+-----------+----------+-------------------+ POP      Full           Yes      Yes                                      +---------+---------------+---------+-----------+----------+-------------------+ PTV      Full                                                             +---------+---------------+---------+-----------+----------+-------------------+ PERO                                                  Not well visualized +---------+---------------+---------+-----------+----------+-------------------+    Summary: RIGHT: - No evidence of common femoral vein obstruction.   LEFT: - There is no evidence of deep vein thrombosis in the lower extremity in areas visualized.  - No cystic structure found in the popliteal fossa.  *  See table(s) above for measurements and observations.    Preliminary     Procedures   Medications Ordered in the ED - No data to display                                 Medical  Decision Making  This patient is a 58 year old female who presents to the ED for concern of left lower extremity pain, described as crampy.  Present x 2 days.  Worse with ambulation.  Not experiencing any new/worsening numbness, weakness, tingling.  Currently being treated for DVT in left lower extremity, not missed any doses of Xarelto .  On physical exam, patient is in no acute distress, afebrile, alert and orient x 4, speaking in full sentences, nontachypneic, nontachycardic.  Notable tender to the posterior aspect of left leg starting at left hamstring and continuing to have tenderness along the posterior aspect of the leg across left calf.  No erythema, no ecchymosis, no decreased sensation, no rashes.  Noted to have good DP pulse in this leg.  Patient currently using compression stockings and no swelling noted to either extremity at this time.  Exam is otherwise unremarkable.  Patient's labs did note an elevated glucose with a slightly elevated anion gap of 16, will have her continue to follow-up with PCP for lab recheck.  Low suspicion for emergent causes patient is resting comfortably, no acute distress.  With patient's current symptoms, suspect this is most likely musculoskeletal as she reports that she has been walking extensively.  Low suspicion for DVT, cellulitis, ischemic limb, fracture, bursitis, septic arthritis.  Will have her continue to treat with Tylenol , Voltaren  gel and return to the ED for new or worsening symptoms but following up with PCP for any persistent symptoms.  Cannot provide anti-inflammatories with patient's current untreated H. pylori.  Will have her continue to follow-up with [facility] for continued treatment of this..  Patient vital signs have remained stable throughout the course of patient's time in the ED. Low suspicion for any other emergent pathology at this time. I believe this patient is safe to be discharged. Provided strict return to ER precautions. Patient  expressed agreement and understanding of plan. All questions were answered.   Differential diagnoses prior to evaluation: The emergent differential diagnosis includes, but is not limited to, cellulitis, DVT, ischemic limb, compartment syndrome, muscle strain, fracture, ligamentous injury, metabolic disturbance, cyst. This is not an exhaustive differential.   Past Medical History / Co-morbidities / Social History: asthma, renal disorder, DVT, GERD, diabetes.  Additional history: Chart reviewed. Pertinent results include: Diagnosed with DVT on 08/04/2023, currently taking Xarelto   Lab Tests/Imaging studies: I personally interpreted labs/imaging and the pertinent results include:   CBC baseline BMP shows mildly elevated anion gap of 16 with elevated blood sugar activity 2 but otherwise unremarkable.  Low suspicion for DKA.  I agree with the radiologist interpretation.  Medications: I ordered medication including Tylenol , Voltaren  gel.  I have reviewed the patients home medicines and have made adjustments as needed.  Critical Interventions: None  Social Determinants of Health: Notably is good follow-up with [facility] for H. pylori treatment as well as with other specialists regarding her.  Conditions  Disposition: After consideration of the diagnostic results and the patients response to treatment, I feel that the patient would benefit from discharge and treatment as above.   emergency department workup does not suggest an emergent condition requiring admission or immediate intervention  beyond what has been performed at this time. The plan is: Follow-up with specialist for heart, follow-up with [facility] for H. pylori, Voltaren  gel and Tylenol  for pain, return to the ED for new or worsening symptoms, follow-up with PCP for any persistent symptoms.. The patient is safe for discharge and has been instructed to return immediately for worsening symptoms, change in symptoms or any other concerns.   Final  diagnoses:  Left leg pain    ED Discharge Orders          Ordered    diclofenac  Sodium (VOLTAREN ) 1 % GEL  4 times daily        09/25/23 1829               Beola Terrall RAMAN, [place] 09/25/23 1829    Lenor Hollering, MD 09/26/23 0001

## 2023-09-25 NOTE — ED Notes (Signed)
No answer for treatment room. 

## 2023-09-25 NOTE — ED Provider Triage Note (Cosign Needed Addendum)
 Emergency Medicine Provider Triage Evaluation Note  Rita Bass , a 58 y.o. female  was evaluated in triage.  Pt complains of right leg pain. Has a known DVT in this leg, endorses worsening pain despite compliance with anticoagulation.  Review of Systems  Positive:  Negative:   Physical Exam  BP (!) 139/92 (BP Location: Right Wrist)   Pulse 99   Temp 98.1 F (36.7 C)   Resp 18   Ht 5' 4 (1.626 m)   Wt (!) 147.4 kg   LMP 04/05/2016   SpO2 94%   BMI 55.79 kg/m  Gen:   Awake, no distress   Resp:  Normal effort  MSK:   Moves extremities without difficulty  Other:  RLE TTP. DP and PT pulses intact and 2+  Medical Decision Making  Medically screening exam initiated at 3:21 PM.  Appropriate orders placed.  Kathalene Sporer was informed that the remainder of the evaluation will be completed by another provider, this initial triage assessment does not replace that evaluation, and the importance of remaining in the ED until their evaluation is complete.     Nora Lauraine LABOR, PA-C 09/25/23 1522    Nora Lauraine LABOR, PA-C 09/25/23 1553

## 2023-09-25 NOTE — ED Triage Notes (Signed)
 Pt here from home with c/o left leg pain , pt has a known clot in that leg , has not missed any of her blood thinners

## 2023-09-25 NOTE — Progress Notes (Signed)
 LLE venous duplex has been completed.  Preliminary results given to Sarah Smoot, PA-C.   Results can be found under chart review under CV PROC. 09/25/2023 5:46 PM Mazin Emma RVT, RDMS

## 2023-09-25 NOTE — Discharge Instructions (Signed)
 You are seen today for left leg pain.  Suspect is most likely muscle related as your exam today was reassuring and labs were also reassuring and exam was also reassuring I have low suspicion for any emergent cause of your symptoms today.  Recommend he continue to monitor these at home and take Tylenol  and Voltaren  gel over the area as needed.  Please follow-up with [facility] for H. pylori treatment as well as with your cardiologist for continued evaluation.  Recommend continue to follow-up with primary care provider for lab recheck as your blood sugar was elevated and you will likely need continued dosing management of your diabetes medication.  Return to the ED for any new or worsening symptoms.

## 2023-09-27 ENCOUNTER — Telehealth: Payer: Self-pay

## 2023-09-27 ENCOUNTER — Encounter: Payer: Self-pay | Admitting: Nurse Practitioner

## 2023-09-27 ENCOUNTER — Ambulatory Visit: Attending: Nurse Practitioner | Admitting: Nurse Practitioner

## 2023-09-27 VITALS — BP 128/70 | HR 97 | Ht 64.0 in | Wt 313.0 lb

## 2023-09-27 DIAGNOSIS — E118 Type 2 diabetes mellitus with unspecified complications: Secondary | ICD-10-CM | POA: Diagnosis not present

## 2023-09-27 DIAGNOSIS — E785 Hyperlipidemia, unspecified: Secondary | ICD-10-CM | POA: Diagnosis not present

## 2023-09-27 DIAGNOSIS — G8929 Other chronic pain: Secondary | ICD-10-CM | POA: Insufficient documentation

## 2023-09-27 DIAGNOSIS — G4733 Obstructive sleep apnea (adult) (pediatric): Secondary | ICD-10-CM | POA: Insufficient documentation

## 2023-09-27 DIAGNOSIS — I10 Essential (primary) hypertension: Secondary | ICD-10-CM | POA: Diagnosis not present

## 2023-09-27 DIAGNOSIS — R072 Precordial pain: Secondary | ICD-10-CM | POA: Diagnosis not present

## 2023-09-27 DIAGNOSIS — Z86718 Personal history of other venous thrombosis and embolism: Secondary | ICD-10-CM | POA: Insufficient documentation

## 2023-09-27 DIAGNOSIS — I2510 Atherosclerotic heart disease of native coronary artery without angina pectoris: Secondary | ICD-10-CM | POA: Insufficient documentation

## 2023-09-27 DIAGNOSIS — Z9189 Other specified personal risk factors, not elsewhere classified: Secondary | ICD-10-CM | POA: Diagnosis not present

## 2023-09-27 DIAGNOSIS — R079 Chest pain, unspecified: Secondary | ICD-10-CM | POA: Insufficient documentation

## 2023-09-27 DIAGNOSIS — R002 Palpitations: Secondary | ICD-10-CM | POA: Diagnosis not present

## 2023-09-27 DIAGNOSIS — I25118 Atherosclerotic heart disease of native coronary artery with other forms of angina pectoris: Secondary | ICD-10-CM

## 2023-09-27 DIAGNOSIS — I471 Supraventricular tachycardia, unspecified: Secondary | ICD-10-CM | POA: Insufficient documentation

## 2023-09-27 DIAGNOSIS — R0609 Other forms of dyspnea: Secondary | ICD-10-CM | POA: Diagnosis not present

## 2023-09-27 NOTE — Telephone Encounter (Signed)
 Hi Dr Elicia,  Pt LVM asking about a PT referral. She said it should be for light PT.  I did not see a referral for PT. Please advise.  Thanks Margit

## 2023-09-27 NOTE — Progress Notes (Addendum)
 Office Visit    Patient Name: Rita Bass Date of Encounter: 09/27/2023  Primary Care Provider:  [name], MD Primary Cardiologist:  Alm Clay, MD  Chief Complaint    58 year old female with history of coronary artery calcification noted on CT, chronic diastolic heart failure, PSVT, DVT, hypertension, hyperlipidemia, type 2 diabetes, OSA, and obesity who presents for follow-up related to chest pain.  Past Medical History    Past Medical History:  Diagnosis Date   Arthritis    Asthma    Chronic pain of left knee    Complication of anesthesia    PONV   Diabetes (HCC)    GERD (gastroesophageal reflux disease)    H/O bronchitis    Hepatic steatosis    Internal hemorrhoids    Kidney stones    Obesity    Renal disorder    Shortness of breath dyspnea    Sleep apnea    past issues - at weight over 400lbs   Sleep apnea in adult    Polysomnogram pending.  Followed by Dr. Shellia   Past Surgical History:  Procedure Laterality Date   BIOPSY  11/01/2020   Procedure: BIOPSY;  Surgeon: San Sandor GAILS, DO;  Location: [facility];  Service: Gastroenterology;;   BIOPSY  05/11/2021   Procedure: BIOPSY;  Surgeon: San Sandor GAILS, DO;  Location: [facility];  Service: Gastroenterology;;   BIOPSY  11/29/2022   Procedure: BIOPSY;  Surgeon: San Sandor GAILS, DO;  Location: [facility];  Service: Gastroenterology;;   CESAREAN SECTION     6 c-sections   CHOLECYSTECTOMY     COLONOSCOPY WITH PROPOFOL  N/A 05/11/2021   Procedure: COLONOSCOPY WITH PROPOFOL ;  Surgeon: San Sandor GAILS, DO;  Location: [facility];  Service: Gastroenterology;  Laterality: N/A;   ESOPHAGOGASTRODUODENOSCOPY (EGD) WITH PROPOFOL  N/A 11/01/2020   Procedure: ESOPHAGOGASTRODUODENOSCOPY (EGD) WITH PROPOFOL ;  Surgeon: San Sandor GAILS, DO;  Location: [facility];  Service: Gastroenterology;  Laterality: N/A;   ESOPHAGOGASTRODUODENOSCOPY (EGD) WITH PROPOFOL  N/A 11/29/2022   Procedure:  ESOPHAGOGASTRODUODENOSCOPY (EGD) WITH PROPOFOL ;  Surgeon: San Sandor GAILS, DO;  Location: [facility];  Service: Gastroenterology;  Laterality: N/A;   HERNIA REPAIR     KIDNEY STONE SURGERY     OOPHORECTOMY     TRANSTHORACIC ECHOCARDIOGRAM  09/2017   Technically difficult study.  Did not use Definity  contrast.  EF was 55 to 60% with moderate LVH and grade 1 diastolic dysfunction.  No significant valvular lesions noted.  No regional wall motion normality but difficult to assess due to poor imaging.    TUBAL LIGATION     UPPER GASTROINTESTINAL ENDOSCOPY      Allergies  No Known Allergies   Labs/Other Studies Reviewed    The following studies were reviewed today:  Cardiac Studies & Procedures   ______________________________________________________________________________________________     ECHOCARDIOGRAM  ECHOCARDIOGRAM COMPLETE 03/24/2023  Narrative ECHOCARDIOGRAM REPORT    Patient Name:   Rita Bass [facility] Date of Exam: 03/24/2023 Medical Rec #:  [id number]             Height:       64.0 in Accession #:    [id number]            Weight:       326.0 lb Date of Birth:  1966-02-14             BSA:          2.408 m Patient Age:    57 years  BP:           124/82 mmHg Patient Gender: F                     HR:           82 bpm. Exam Location:  Inpatient  Procedure: 2D Echo, Cardiac Doppler, Color Doppler and Intracardiac Opacification Agent  Indications:    CP  History:        Patient has prior history of Echocardiogram examinations, most recent 09/18/2017. Signs/Symptoms:Dyspnea and Shortness of Breath; Risk Factors:Sleep Apnea, Diabetes, Hypertension, Dyslipidemia and Non-Smoker.  Sonographer:    Juanita Shaw Referring Phys: [id number] SROBONA TUBLU CHATTERJEE  IMPRESSIONS   1. Images are very limited. 2. Left ventricular ejection fraction, by estimation, is 55 to 60%. The left ventricle has normal function. Left ventricular endocardial border not  optimally defined to evaluate regional wall motion. Left ventricular diastolic parameters are consistent with Grade I diastolic dysfunction (impaired relaxation). 3. Definity  contrast does not demonstrate any obvious wall motion abnormalities. 4. Right ventricular systolic function was not well visualized. The right ventricular size is not well visualized. Tricuspid regurgitation signal is inadequate for assessing PA pressure. 5. The mitral valve was not well visualized. Trivial mitral valve regurgitation. 6. The aortic valve was not well visualized. Aortic valve regurgitation is not visualized. Aortic valve mean gradient measures 3.0 mmHg. 7. Unable to estimate CVP.  Comparison(s): Prior images unable to be directly viewed.  FINDINGS Left Ventricle: Left ventricular ejection fraction, by estimation, is 55 to 60%. The left ventricle has normal function. Left ventricular endocardial border not optimally defined to evaluate regional wall motion. The left ventricular internal cavity size was normal in size. Suboptimal image quality limits for assessment of left ventricular hypertrophy. Left ventricular diastolic parameters are consistent with Grade I diastolic dysfunction (impaired relaxation).  Right Ventricle: The right ventricular size is not well visualized. Right vetricular wall thickness was not well visualized. Right ventricular systolic function was not well visualized. Tricuspid regurgitation signal is inadequate for assessing PA pressure.  Left Atrium: Left atrial size was normal in size.  Right Atrium: Right atrial size was normal in size.  Pericardium: The pericardium was not well visualized.  Mitral Valve: The mitral valve was not well visualized. Trivial mitral valve regurgitation. MV peak gradient, 2.9 mmHg. The mean mitral valve gradient is 1.0 mmHg.  Tricuspid Valve: The tricuspid valve is not well visualized. Tricuspid valve regurgitation is trivial.  Aortic Valve: The  aortic valve was not well visualized. Aortic valve regurgitation is not visualized. Aortic valve mean gradient measures 3.0 mmHg. Aortic valve peak gradient measures 6.1 mmHg. Aortic valve area, by VTI measures 3.24 cm.  Pulmonic Valve: The pulmonic valve was not well visualized. Pulmonic valve regurgitation is not visualized.  Aorta: The aortic root and ascending aorta are structurally normal, with no evidence of dilitation.  Venous: Unable to estimate CVP. The inferior vena cava was not well visualized.  IAS/Shunts: The interatrial septum was not well visualized.   LEFT VENTRICLE PLAX 2D LVIDd:         3.90 cm     Diastology LVIDs:         2.70 cm     LV e' medial:    7.72 cm/s LV PW:         0.90 cm     LV E/e' medial:  7.6 LV IVS:        0.80 cm     LV e'  lateral:   8.92 cm/s LVOT diam:     1.90 cm     LV E/e' lateral: 6.6 LV SV:         68 LV SV Index:   28 LVOT Area:     2.84 cm  LV Volumes (MOD) LV vol d, MOD A2C: 93.9 ml LV vol d, MOD A4C: 85.7 ml LV vol s, MOD A2C: 43.9 ml LV vol s, MOD A4C: 40.7 ml LV SV MOD A2C:     50.0 ml LV SV MOD A4C:     85.7 ml LV SV MOD BP:      49.5 ml  RIGHT VENTRICLE RV S prime:     12.10 cm/s TAPSE (M-mode): 2.0 cm  LEFT ATRIUM           Index LA diam:      3.40 cm 1.41 cm/m LA Vol (A2C): 46.9 ml 19.48 ml/m LA Vol (A4C): 21.6 ml 8.97 ml/m AORTIC VALVE                    PULMONIC VALVE AV Area (Vmax):    2.44 cm     PV Vmax:       1.25 m/s AV Area (Vmean):   2.34 cm     PV Peak grad:  6.3 mmHg AV Area (VTI):     3.24 cm AV Vmax:           123.00 cm/s AV Vmean:          87.300 cm/s AV VTI:            0.209 m AV Peak Grad:      6.1 mmHg AV Mean Grad:      3.0 mmHg LVOT Vmax:         106.00 cm/s LVOT Vmean:        71.900 cm/s LVOT VTI:          0.239 m LVOT/AV VTI ratio: 1.14  AORTA Ao Root diam: 3.00 cm Ao Asc diam:  3.00 cm  MITRAL VALVE MV Area (PHT): 3.08 cm    SHUNTS MV Area VTI:   3.95 cm    Systemic VTI:   0.24 m MV Peak grad:  2.9 mmHg    Systemic Diam: 1.90 cm MV Mean grad:  1.0 mmHg MV Vmax:       0.84 m/s MV Vmean:      55.1 cm/s MV Decel Time: 246 msec MV E velocity: 58.70 cm/s MV A velocity: 72.80 cm/s MV E/A ratio:  0.81  Jayson Sierras MD Electronically signed by Jayson Sierras MD Signature Date/Time: 03/24/2023/2:36:11 PM    Final          ______________________________________________________________________________________________     Recent Labs: 03/19/2023: Magnesium  2.0 04/24/2023: ALT 24; B Natriuretic Peptide 22.3 09/25/2023: BUN 11; Creatinine, Ser 0.61; Hemoglobin 13.8; Platelets 307; Potassium 4.3; Sodium 137  Recent Lipid Panel    Component Value Date/Time   CHOL 192 06/21/2020 1547   TRIG 131 06/21/2020 1547   HDL 54 06/21/2020 1547   CHOLHDL 3.6 06/21/2020 1547   LDLCALC 115 (H) 06/21/2020 1547   LDLDIRECT 117 (H) 02/28/2021 9082    History of Present Illness    58 year old female with the above past medical history including coronary artery calcification noted on CT, chronic diastolic heart failure, PSVT, DVT, hypertension, hyperlipidemia, type 2 diabetes, OSA, and obesity.  Cardiopulmonary exercise test in 2019 showed no evidence of cardiac limitation.  Exercise intolerance was noted to be  due to obesity.  She has evidence of coronary artery calcification noted on CT chest in 2024.  Echocardiogram in 03/2023 showed EF 55 to 60%, normal LV function, no RWMA, G1 DD, normal RV systolic function, no significant valvular abnormalities.  She was last seen in the office on 06/27/2023 and reported atypical chest pain with associated dizziness, blackouts.  Cardiac monitor in 06/2023 showed predominantly sinus rhythm, first-degree AV block, 6 runs of SVT, PACs and PVCs, no significant arrhythmia.  Cardiac PET stress test was ordered but not completed due to denial by her insurance.  She was diagnosed with lower extremity DVT in 07/2023.  Lower extremity venous duplex  on 09/25/2023 showed no evidence of DVT.  She remains on Xarelto .  She presents today for follow-up accompanied by her daughter.  Since her last visit she has been stable from a cardiac standpoint.  She continues to note intermittent left-sided chest pain, dyspnea on exertion, elevated heart rate with minimal exertion, with associated tightness in her neck.  She denies any other significant palpitations. She has stable nonpitting bilateral lower extremity edema, unchanged from prior visits. Since being diagnosed with DVT, she reports bilateral leg pain.  She reports that she was also diagnosed with H. pylori.  She is eager to proceed with cardiac PET stress test given her ongoing symptoms.  Home Medications    Current Outpatient Medications  Medication Sig Dispense Refill   Accu-Chek Softclix Lancets lancets USE IN THE MORNING, AT NOON AND AT BEDTIME 100 each 12   acetaminophen  (TYLENOL ) 650 MG CR tablet Take 650-1,300 mg by mouth every 8 (eight) hours as needed for pain. Last dose (2) around 0930 am.     Blood Glucose Monitoring Suppl DEVI 1 each by Does not apply route in the morning, at noon, and at bedtime. May substitute to any manufacturer covered by patient's insurance. 1 each 0   Continuous Glucose Receiver (FREESTYLE LIBRE 3 READER) DEVI 1 Device as directed.     Continuous Glucose Sensor (FREESTYLE LIBRE 3 PLUS SENSOR) MISC Change sensor every 15 days. 2 each 11   cyclobenzaprine  (FLEXERIL ) 10 MG tablet Take 1 tablet (10 mg total) by mouth 2 (two) times daily as needed for muscle spasms. 60 tablet 0   diclofenac  Sodium (VOLTAREN ) 1 % GEL Apply 2 g topically 4 (four) times daily. 100 g 0   DULoxetine  (CYMBALTA ) 60 MG capsule Take 1 capsule (60 mg total) by mouth daily. 30 capsule 3   empagliflozin  (JARDIANCE ) 25 MG TABS tablet Take 1 tablet (25 mg total) by mouth daily. 90 tablet 3   gabapentin  (NEURONTIN ) 100 MG capsule Take 6 capsules (600 mg total) by mouth 3 (three) times daily. 360  capsule 2   glucose blood (ACCU-CHEK GUIDE) test strip 1 each by Other route daily. for testing 100 strip 1   insulin  glargine (LANTUS ) 100 UNIT/ML Solostar Pen Inject 30-50 Units into the skin every morning. Inject 30 units subcutaneously every morning. Increase by 2 units each day until sugar < 150. Maximum daily dose 50 units. 15 mL 5   Insulin  Pen Needle (PEN NEEDLES) 32G X 5 MM MISC Use as directed 30 each 3   metFORMIN  (GLUCOPHAGE -XR) 500 MG 24 hr tablet Take 2 tablets (1,000 mg total) by mouth 2 (two) times daily. 360 tablet 3   olopatadine  (PATANOL) 0.1 % ophthalmic solution Place 1 drop into both eyes 2 (two) times daily. 5 mL 12   pantoprazole  (PROTONIX ) 40 MG tablet Take 1 tablet (40  mg total) by mouth daily. Patient needs follow up appointment for future refills. Please call [phone] to schedule an appointment. 90 tablet 0   rivaroxaban  (XARELTO ) 20 MG TABS tablet Take 1 tablet (20 mg total) by mouth daily with supper. With a meal 60 tablet 0   Semaglutide , 2 MG/DOSE, (OZEMPIC , 2 MG/DOSE,) 8 MG/3ML SOPN Inject 2 mg into the skin once a week. 3 mL 5   spironolactone  (ALDACTONE ) 25 MG tablet Take 1 tablet (25 mg total) by mouth daily. 90 tablet 3   traMADol  (ULTRAM ) 50 MG tablet Take 1 tablet (50 mg total) by mouth every 12 (twelve) hours as needed. Not to be refilled before 30 days 60 tablet 0   traZODone  (DESYREL ) 50 MG tablet Take 1 tablet (50 mg total) by mouth at bedtime as needed for sleep. 30 tablet 3   albuterol  (PROVENTIL ) (5 MG/ML) 0.5% nebulizer solution Take 0.5 mLs (2.5 mg total) by nebulization every 6 (six) hours as needed for wheezing or shortness of breath. (Patient not taking: Reported on 09/27/2023) 20 mL 0   hydrocortisone  (ANUSOL -HC) 2.5 % rectal cream Place rectally as needed for hemorrhoids or anal itching. (Patient not taking: Reported on 09/27/2023)     ipratropium-albuterol  (DUONEB) 0.5-2.5 (3) MG/3ML SOLN Take 3 mLs by nebulization every 6 (six) hours as needed.  (Patient not taking: Reported on 09/27/2023) 180 mL 0   mometasone -formoterol  (DULERA ) 100-5 MCG/ACT AERO Inhale 2 puffs into the lungs daily. (Patient not taking: Reported on 09/27/2023) 39 g 3   nystatin  (MYCOSTATIN /NYSTOP ) powder Apply 1 Application topically 2 (two) times daily. (Patient not taking: Reported on 09/27/2023) 60 g 5   No current facility-administered medications for this visit.     Review of Systems    She denies pnd, orthopnea, n, v, dizziness, syncope, weight gain, or early satiety. All other systems reviewed and are otherwise negative except as noted above.   Physical Exam    VS:  BP 128/70   Pulse 97   Ht 5' 4 (1.626 m)   Wt (!) 313 lb (142 kg)   LMP 04/05/2016   SpO2 95%   BMI 53.73 kg/m   GEN: Well nourished, well developed, in no acute distress. HEENT: normal. Neck: Supple, no JVD, carotid bruits, or masses. Cardiac: RRR, no murmurs, rubs, or gallops. No clubbing, cyanosis, nonpitting bilateral lower extremity edema.  Radials/DP/PT 2+ and equal bilaterally.  Respiratory:  Respirations regular and unlabored, clear to auscultation bilaterally. GI: Soft, nontender, nondistended, BS + x 4. MS: no deformity or atrophy. Skin: warm and dry, no rash. Neuro:  Strength and sensation are intact. Psych: Normal affect.  Accessory Clinical Findings    ECG personally reviewed by me today -    - no EKG in office today.    Lab Results  Component Value Date   WBC 9.1 09/25/2023   HGB 13.8 09/25/2023   HCT 44.7 09/25/2023   MCV 75.0 (L) 09/25/2023   PLT 307 09/25/2023   Lab Results  Component Value Date   CREATININE 0.61 09/25/2023   BUN 11 09/25/2023   NA 137 09/25/2023   K 4.3 09/25/2023   CL 97 (L) 09/25/2023   CO2 24 09/25/2023   Lab Results  Component Value Date   ALT 24 04/24/2023   AST 20 04/24/2023   ALKPHOS 77 04/24/2023   BILITOT 0.4 04/24/2023   Lab Results  Component Value Date   CHOL 192 06/21/2020   HDL 54 06/21/2020   LDLCALC 115 (H)  06/21/2020   LDLDIRECT 117 (H) 02/28/2021   TRIG 131 06/21/2020   CHOLHDL 3.6 06/21/2020    Lab Results  Component Value Date   HGBA1C 10.4 (A) 06/18/2023    Assessment & Plan    1. Coronary artery calcification/chest pain/dyspnea on exertion: Cardiopulmonary exercise test in 2019 showed no evidence of cardiac limitation.  Exercise intolerance was noted to be due to obesity.  She has evidence of coronary artery calcification noted on CT chest in 2024.  Cardiac PET stress test was previously ordered ordered but not completed (was denied by her insurance).  She continues to note intermittent left-sided chest pain, dyspnea on exertion, elevated heart rate with minimal exertion, with associated tightness in her neck.  Through shared decision making, will again pursue cardiac PET stress test.  She is not a candidate for Myoview or coronary CT in the setting of elevated BMI.  She is not a candidate for ETT in the setting of obesity, exercise intolerance, and bilateral leg pain in the setting of DVT.  Repeat fasting lipids, LFTs pending as below.  Informed Consent   Shared Decision Making/Informed Consent The risks [chest pain, shortness of breath, cardiac arrhythmias, dizziness, blood pressure fluctuations, myocardial infarction, stroke/transient ischemic attack, nausea, vomiting, allergic reaction, radiation exposure, metallic taste sensation and life-threatening complications (estimated to be 1 in 10,000)], benefits (risk stratification, diagnosing coronary artery disease, treatment guidance) and alternatives of a cardiac PET stress test were discussed in detail with Ms. Iwanicki and she agrees to proceed.    Addendum 10/11/2023: Once again, patient's insurance denied coverage for cardiac PET stress test.  Reviewed with Dr. Anner, primary cardiologist, who recommends proceeding with coronary CT angiogram.  Patient notified of recommendation and is agreeable to proceed.  Orders placed.  2. Chronic  diastolic heart failure: Echocardiogram in 03/2023 showed EF 55 to 60%, normal LV function, no RWMA, G1 DD, normal RV systolic function, no significant valvular abnormalities.  She does report dyspnea on exertion as above, likely multifactorial.  Generally euvolemic and well compensated on exam.  Continue Jardiance , spironolactone .  3. Palpitations/PSVT: Cardiac monitor in 06/2023 showed predominantly sinus rhythm, first-degree AV block, 6 runs of SVT, PACs and PVCs, no significant arrhythmia.  She does report elevated heart rate with minimal activity.  Pending cardiac PET stress test as above.  Should she have worsening symptoms, consider need for beta-blocker therapy.  4. History of DVT: She was diagnosed with lower extremity DVT in 07/2023.  Lower extremity venous duplex on 09/25/2023 showed no evidence of DVT.  She remains on Xarelto .  5. Hypertension: BP well controlled. Continue current antihypertensive regimen.   6. Hyperlipidemia: LDL was 117 in 02/2021, above goal.  No recent LDL on file.  Will update fasting lipids, CMET prior to next visit.  Previously on atorvastatin .  Will address lipid-lowering therapy at next follow-up visit.  7. Type 2 diabetes/obesity: A1c was 10.4 in 06/2023.  Monitored and managed per PCP. Continue to encourage lifestyle modifications with diet and increased activity as tolerated.  8. OSA: Not on CPAP.   9. Disposition: Follow-up in 2 months, sooner if needed.       Damien JAYSON Braver, NP 09/27/2023, 2:23 PM

## 2023-09-27 NOTE — Patient Instructions (Addendum)
 Medication Instructions:  Your physician recommends that you continue on your current medications as directed. Please refer to the Current Medication list given to you today.  *If you need a refill on your cardiac medications before your next appointment, please call your pharmacy*  Lab Work: Fasting lipid panel, CMET prior to next apt  Testing/Procedures:    Please report to Radiology at the [facility] Main Entrance 30 minutes early for your test.  [address] [place], [place] [address]                         OR   Please report to Radiology at [facility] Main Entrance, medical mall, 30 mins prior to your test.  [address]  [place], [place]  How to Prepare for Your Cardiac PET/CT Stress Test:  Nothing to eat or drink, except water, 3 hours prior to arrival time.  NO caffeine/decaffeinated products, or chocolate 12 hours prior to arrival. (Please note decaffeinated beverages (teas/coffees) still contain caffeine).  If you have caffeine within 12 hours prior, the test will need to be rescheduled.  Medication instructions: Do not take erectile dysfunction medications for 72 hours prior to test (sildenafil, tadalafil) Do not take nitrates (isosorbide mononitrate, Ranexa) the day before or day of test Do not take tamsulosin  the day before or morning of test Hold theophylline containing medications for 12 hours. Hold Dipyridamole 48 hours prior to the test.  Diabetic Preparation: If able to eat breakfast prior to 3 hour fasting, you may take all medications, including your insulin . Do not worry if you miss your breakfast dose of insulin  - start at your next meal. If you do not eat prior to 3 hour fast-Hold all diabetes (oral and insulin ) medications. Patients who wear a continuous glucose monitor MUST remove the device prior to scanning.  You may take your remaining medications with water.  NO perfume, cologne or lotion on chest or  abdomen area. FEMALES - Please avoid wearing dresses to this appointment.  Total time is 1 to 2 hours; you may want to bring reading material for the waiting time.  IF YOU THINK YOU MAY BE PREGNANT, OR ARE NURSING PLEASE INFORM THE TECHNOLOGIST.  In preparation for your appointment, medication and supplies will be purchased.  Appointment availability is limited, so if you need to cancel or reschedule, please call the Radiology Department Scheduler at [phone] 24 hours in advance to avoid a cancellation fee of $100.00  What to Expect When you Arrive:  Once you arrive and check in for your appointment, you will be taken to a preparation room within the Radiology Department.  A technologist or Nurse will obtain your medical history, verify that you are correctly prepped for the exam, and explain the procedure.  Afterwards, an IV will be started in your arm and electrodes will be placed on your skin for EKG monitoring during the stress portion of the exam. Then you will be escorted to the PET/CT scanner.  There, staff will get you positioned on the scanner and obtain a blood pressure and EKG.  During the exam, you will continue to be connected to the EKG and blood pressure machines.  A small, safe amount of a radioactive tracer will be injected in your IV to obtain a series of pictures of your heart along with an injection of a stress agent.    After your Exam:  It is recommended that you eat  a meal and drink a caffeinated beverage to counter act any effects of the stress agent.  Drink plenty of fluids for the remainder of the day and urinate frequently for the first couple of hours after the exam.  Your doctor will inform you of your test results within 7-10 business days.  For more information and frequently asked questions, please visit our website: [email or web address]  For questions about your test or how to prepare for your test, please call: Cardiac Imaging Nurse  Navigators Office: [phone]   Follow-Up: At [facility], you and your health needs are our priority.  As part of our continuing mission to provide you with exceptional heart care, our providers are all part of one team.  This team includes your primary Cardiologist (physician) and Advanced Practice Providers or APPs (Physician Assistants and Nurse Practitioners) who all work together to provide you with the care you need, when you need it.  Your next appointment:   2 month(s)  Provider:   Alm Clay, MD or Damien Braver, NP          We recommend signing up for the patient portal called MyChart.  Sign up information is provided on this After Visit Summary.  MyChart is used to connect with patients for Virtual Visits (Telemedicine).  Patients are able to view lab/test results, encounter notes, upcoming appointments, etc.  Non-urgent messages can be sent to your provider as well.   To learn more about what you can do with MyChart, go to [email or web address].

## 2023-09-30 ENCOUNTER — Encounter: Payer: Self-pay | Admitting: Nurse Practitioner

## 2023-09-30 ENCOUNTER — Ambulatory Visit: Admitting: Student

## 2023-10-03 ENCOUNTER — Ambulatory Visit (INDEPENDENT_AMBULATORY_CARE_PROVIDER_SITE_OTHER): Admitting: Pharmacist

## 2023-10-03 ENCOUNTER — Encounter: Payer: Self-pay | Admitting: Pharmacist

## 2023-10-03 VITALS — BP 128/84 | HR 96 | Wt 315.0 lb

## 2023-10-03 DIAGNOSIS — E118 Type 2 diabetes mellitus with unspecified complications: Secondary | ICD-10-CM | POA: Diagnosis not present

## 2023-10-03 NOTE — Assessment & Plan Note (Signed)
 Diabetes longstanding in patient who arrives in state of high stress and states her glucose remains out of control  Denies sx of infection.  Reports adherence to insulin  regimen. Patient has not increased basal insulin  as advised at each of last two visits due to fear of headache.   - Advised to take additional 10 units of Basaglar  (insulin  glargine) for a total of 40 units today.  Asked to take 40 units again tomorrow THEN increase by 1 additional unit each day until she reached 50 units daily OR she observed glucose readings < 150 -Continue metformin , Continue Jardiance .

## 2023-10-03 NOTE — Patient Instructions (Signed)
 It was nice to see you today!    Your goal glucose reading in the near future is less than 200.  Medication Changes  Increase Lantus  (insulin  glargine)  Today, take an extra 10 units Tomorrow, take 40 units  Starting Friday Increase by 1 unit each day until you see a reading less than 150  Or your next visit.   Continue all other medication the same.   Keep up the good work with diet and exercise. Aim for a diet full of vegetables, fruit and lean meats (chicken, [place], fish). Try to limit salt intake by eating fresh or frozen vegetables (instead of canned), rinse canned vegetables prior to cooking and do not add any additional salt to meals.

## 2023-10-03 NOTE — Progress Notes (Signed)
    S:     Chief Complaint  Patient presents with   Medication Management    Diabetes - Stress / Pain   58 y.o. female who presents for diabetes evaluation, education, and management. Patient is accompanied by her daughter, Samule and two grandchildren.   PMH is significant for leg pain (possible DVT), diastolic heart failure, OA.  Patient reports Diabetes was diagnosed in 2019.     Current diabetes medications include: Ozempic  (semaglutide ) 2 mg, metformin  1,000 mg BID, Jardiance  (empagliflozin ) 25 mg daily, Lantus  (insulin  glargine) 30 units daily.  Current hypertension medications include: spironolactone  25 mg.  Current hyperlipidemia medications include: none currently.    States STRESS has been extremely high lately leading to lack of sleep and consistent worry.   Several stressors appear to be unescapable for the patient.  Glucose readings > 350 consistently.     O:   Review of Systems  Psychiatric/Behavioral:  The patient is nervous/anxious.     Physical Exam Vitals reviewed.  Pulmonary:     Effort: Pulmonary effort is normal.  Neurological:     Mental Status: She is alert.     Libre3 CGM Download today - only 3 days of use % Time CGM is active: 29% Average Glucose: 311 mg/dL - Time above range: 13%   Lab Results  Component Value Date   HGBA1C 10.4 (A) 06/18/2023   Vitals:   10/03/23 1101  BP: 128/84  Pulse: 96  SpO2: 95%    Lipid Panel     Component Value Date/Time   CHOL 192 06/21/2020 1547   TRIG 131 06/21/2020 1547   HDL 54 06/21/2020 1547   CHOLHDL 3.6 06/21/2020 1547   LDLCALC 115 (H) 06/21/2020 1547   LDLDIRECT 117 (H) 02/28/2021 0917    Clinical Atherosclerotic Cardiovascular Disease (ASCVD):  The ASCVD Risk score (Arnett DK, et al., 2019) failed to calculate for the following reasons:   Cannot find a previous HDL lab   Cannot find a previous total cholesterol lab   Patient is participating in a Managed Medicaid Plan:  Yes    A/P: Diabetes longstanding in patient who arrives in state of high stress and states her glucose remains out of control  Denies sx of infection.  Reports adherence to insulin  regimen. Patient has not increased basal insulin  as advised at each of last two visits due to fear of headache.   - Advised to take additional 10 units of Basaglar  (insulin  glargine) for a total of 40 units today.  Asked to take 40 units again tomorrow THEN increase by 1 additional unit each day until she reached 50 units daily OR she observed glucose readings < 150 -Continue metformin , Continue Jardiance .  Pain appears to be significant source of stress and worsened glucose control.  Asked Dr. Zhenge, PCP to consider tramadol  dose adjustment AND also review therapy referral for this patient.   Written patient instructions provided. Patient verbalized understanding of treatment plan.  Total time in face to face counseling 48 minutes.    Follow-up:  Pharmacist 10/15/23 PCP clinic visit 10/24/23

## 2023-10-04 ENCOUNTER — Telehealth: Payer: Self-pay | Admitting: Physician Assistant

## 2023-10-04 NOTE — Telephone Encounter (Signed)
 Spoke with patient confirming upcoming appointment

## 2023-10-04 NOTE — Progress Notes (Signed)
 Reviewed and agree with Dr Macky Lower plan.

## 2023-10-07 NOTE — Telephone Encounter (Signed)
 LVM to let patient know I received her message. I told her that once Dr Elicia places the PT order I will be glad to send it out and let her know where the PT referral was sent.  Margit Zackary LATHER

## 2023-10-07 NOTE — Telephone Encounter (Signed)
 Pt calling again inquiring about PT referral. Please advise.  Rita Bass

## 2023-10-08 ENCOUNTER — Other Ambulatory Visit: Payer: Self-pay | Admitting: Family Medicine

## 2023-10-08 DIAGNOSIS — M170 Bilateral primary osteoarthritis of knee: Secondary | ICD-10-CM

## 2023-10-10 ENCOUNTER — Telehealth: Payer: Self-pay

## 2023-10-10 DIAGNOSIS — R072 Precordial pain: Secondary | ICD-10-CM

## 2023-10-10 DIAGNOSIS — R0609 Other forms of dyspnea: Secondary | ICD-10-CM

## 2023-10-10 DIAGNOSIS — G8929 Other chronic pain: Secondary | ICD-10-CM

## 2023-10-10 DIAGNOSIS — I2510 Atherosclerotic heart disease of native coronary artery without angina pectoris: Secondary | ICD-10-CM

## 2023-10-10 MED ORDER — METOPROLOL TARTRATE 100 MG PO TABS: ORAL_TABLET | ORAL | 0 refills | Status: DC

## 2023-10-10 NOTE — Telephone Encounter (Signed)
 Spoke with pt. Cardiac PET stress test was denied a second time. Ok per Dr. Anner to proceed with a coronary CT angiogram if pt is agreeable. Pt voiced understanding and would like to move forward with coronary CT angiogram. Order placed. Please arrange. Thanks

## 2023-10-11 ENCOUNTER — Other Ambulatory Visit: Payer: Self-pay | Admitting: Family Medicine

## 2023-10-11 DIAGNOSIS — I82402 Acute embolism and thrombosis of unspecified deep veins of left lower extremity: Secondary | ICD-10-CM

## 2023-10-11 NOTE — Telephone Encounter (Signed)
 Pt is requesting a callback from nurse before this appt is made since she spoke with her insurance. She stated they've informed her that the last 2 times the auth wasn't signed by MD so it was denied twice. The MD must sign it before they can approve it so she'd like to speak with nurse again about it being done. Please advise.

## 2023-10-11 NOTE — Telephone Encounter (Signed)
 Called patient back about her message. Patient stated her insurance stated that the doctor needed to sign something before they would approve the cardiac CT. Will see what is needed by sending message to pre cert/auth pool.

## 2023-10-14 ENCOUNTER — Other Ambulatory Visit: Payer: Self-pay

## 2023-10-14 DIAGNOSIS — R1033 Periumbilical pain: Secondary | ICD-10-CM

## 2023-10-14 DIAGNOSIS — M170 Bilateral primary osteoarthritis of knee: Secondary | ICD-10-CM

## 2023-10-15 ENCOUNTER — Ambulatory Visit: Admitting: Pharmacist

## 2023-10-15 MED ORDER — TRAMADOL HCL 50 MG PO TABS: 50.0000 mg | ORAL_TABLET | Freq: Two times a day (BID) | ORAL | 0 refills | Status: DC | PRN

## 2023-10-16 ENCOUNTER — Telehealth (HOSPITAL_COMMUNITY): Payer: Self-pay | Admitting: Emergency Medicine

## 2023-10-16 NOTE — Telephone Encounter (Signed)
 Reaching out to patient to offer assistance regarding upcoming cardiac imaging study; pt verbalizes understanding of appt date/time, parking situation and where to check in, pre-test NPO status and medications ordered, and verified current allergies; name and call back number provided for further questions should they arise Rockwell Alexandria RN Navigator Cardiac Imaging [name] [phone] office [phone] cell

## 2023-10-17 ENCOUNTER — Ambulatory Visit (HOSPITAL_COMMUNITY)
Admission: RE | Admit: 2023-10-17 | Discharge: 2023-10-17 | Disposition: A | Discharge status: Home / Self Care | Source: Ambulatory Visit | Attending: Cardiology | Admitting: Cardiology

## 2023-10-17 DIAGNOSIS — Z9189 Other specified personal risk factors, not elsewhere classified: Secondary | ICD-10-CM | POA: Diagnosis not present

## 2023-10-17 DIAGNOSIS — R079 Chest pain, unspecified: Secondary | ICD-10-CM | POA: Insufficient documentation

## 2023-10-17 DIAGNOSIS — R072 Precordial pain: Secondary | ICD-10-CM | POA: Insufficient documentation

## 2023-10-17 DIAGNOSIS — R0609 Other forms of dyspnea: Secondary | ICD-10-CM | POA: Diagnosis not present

## 2023-10-17 DIAGNOSIS — I2510 Atherosclerotic heart disease of native coronary artery without angina pectoris: Secondary | ICD-10-CM | POA: Diagnosis not present

## 2023-10-17 DIAGNOSIS — G8929 Other chronic pain: Secondary | ICD-10-CM | POA: Diagnosis not present

## 2023-10-17 MED ORDER — NITROGLYCERIN 0.4 MG SL SUBL
0.8000 mg | SUBLINGUAL_TABLET | Freq: Once | SUBLINGUAL | Status: AC
Start: 2023-10-17 — End: 2023-10-17
  Administered 2023-10-17: 0.8 mg via SUBLINGUAL

## 2023-10-17 MED ORDER — IOHEXOL 350 MG/ML SOLN
100.0000 mL | Freq: Once | INTRAVENOUS | Status: AC | PRN
  Administered 2023-10-17: 100 mL via INTRAVENOUS

## 2023-10-18 ENCOUNTER — Other Ambulatory Visit: Payer: Self-pay | Admitting: Internal Medicine

## 2023-10-18 ENCOUNTER — Encounter: Payer: Self-pay | Admitting: *Deleted

## 2023-10-18 ENCOUNTER — Ambulatory Visit (HOSPITAL_BASED_OUTPATIENT_CLINIC_OR_DEPARTMENT_OTHER)
Admission: RE | Admit: 2023-10-18 | Discharge: 2023-10-18 | Disposition: A | Discharge status: Home / Self Care | Source: Ambulatory Visit | Attending: Internal Medicine | Admitting: Internal Medicine

## 2023-10-18 DIAGNOSIS — R931 Abnormal findings on diagnostic imaging of heart and coronary circulation: Secondary | ICD-10-CM

## 2023-10-18 DIAGNOSIS — I2510 Atherosclerotic heart disease of native coronary artery without angina pectoris: Secondary | ICD-10-CM | POA: Diagnosis not present

## 2023-10-18 DIAGNOSIS — G8929 Other chronic pain: Secondary | ICD-10-CM

## 2023-10-18 NOTE — Progress Notes (Signed)
 Call from a [place] with [facility] GI program. Need to do an upper endoscopy on this patient and need clearance from hematology. Informed her that she has not been seen yet as new patient and was given dates of her appointments. She will fax clearance form to office for the visit.

## 2023-10-21 ENCOUNTER — Ambulatory Visit (HOSPITAL_BASED_OUTPATIENT_CLINIC_OR_DEPARTMENT_OTHER): Payer: Self-pay | Admitting: Family

## 2023-10-21 ENCOUNTER — Other Ambulatory Visit (HOSPITAL_BASED_OUTPATIENT_CLINIC_OR_DEPARTMENT_OTHER): Payer: Self-pay

## 2023-10-21 ENCOUNTER — Other Ambulatory Visit (HOSPITAL_BASED_OUTPATIENT_CLINIC_OR_DEPARTMENT_OTHER): Payer: Self-pay | Admitting: Family

## 2023-10-21 ENCOUNTER — Encounter (HOSPITAL_BASED_OUTPATIENT_CLINIC_OR_DEPARTMENT_OTHER): Payer: Self-pay

## 2023-10-21 DIAGNOSIS — G8929 Other chronic pain: Secondary | ICD-10-CM

## 2023-10-21 DIAGNOSIS — I2510 Atherosclerotic heart disease of native coronary artery without angina pectoris: Secondary | ICD-10-CM

## 2023-10-21 DIAGNOSIS — Z79899 Other long term (current) drug therapy: Secondary | ICD-10-CM

## 2023-10-21 NOTE — Telephone Encounter (Signed)
 Routed to clinic team for assistance scheduling. Plan for [facility] 11/05/23 with Dr. Anner. Will need updated CBC/BMET.  Meds prior to LHC:  11/02/23 hold Jardiance  11/03/23 hold Ozempic  11/03/23 hold Xarelto  8/19-8/21/25 hold Metformin  11/05/23 hold insulin  morning of procedure  [name], NP

## 2023-10-24 ENCOUNTER — Ambulatory Visit: Admitting: Family Medicine

## 2023-10-24 ENCOUNTER — Ambulatory Visit: Admitting: Pharmacist

## 2023-10-24 NOTE — Progress Notes (Deleted)
    SUBJECTIVE:   CHIEF COMPLAINT / HPI:   ***    - had CAD seen on coronary CT, has LHC on 8/19 -    PERTINENT  PMH / PSH: CAD, T2DM, HTN, BL knee OA  OBJECTIVE:   LMP 04/05/2016   ***  ASSESSMENT/PLAN:   Assessment & Plan      Rita Nearing, MD [facility]

## 2023-10-25 ENCOUNTER — Ambulatory Visit

## 2023-10-30 ENCOUNTER — Ambulatory Visit

## 2023-10-30 ENCOUNTER — Ambulatory Visit: Attending: Nurse Practitioner | Admitting: Nurse Practitioner

## 2023-10-30 ENCOUNTER — Other Ambulatory Visit: Payer: Self-pay | Admitting: Nurse Practitioner

## 2023-10-30 ENCOUNTER — Inpatient Hospital Stay: Admitting: Physician Assistant

## 2023-10-30 ENCOUNTER — Encounter: Payer: Self-pay | Admitting: Nurse Practitioner

## 2023-10-30 ENCOUNTER — Inpatient Hospital Stay

## 2023-10-30 VITALS — BP 104/66 | HR 109 | Ht 64.0 in | Wt 311.8 lb

## 2023-10-30 DIAGNOSIS — R079 Chest pain, unspecified: Secondary | ICD-10-CM | POA: Diagnosis not present

## 2023-10-30 DIAGNOSIS — R002 Palpitations: Secondary | ICD-10-CM | POA: Insufficient documentation

## 2023-10-30 DIAGNOSIS — I5032 Chronic diastolic (congestive) heart failure: Secondary | ICD-10-CM | POA: Diagnosis not present

## 2023-10-30 DIAGNOSIS — I10 Essential (primary) hypertension: Secondary | ICD-10-CM

## 2023-10-30 DIAGNOSIS — Z79899 Other long term (current) drug therapy: Secondary | ICD-10-CM | POA: Diagnosis not present

## 2023-10-30 DIAGNOSIS — I471 Supraventricular tachycardia, unspecified: Secondary | ICD-10-CM | POA: Diagnosis not present

## 2023-10-30 DIAGNOSIS — G4733 Obstructive sleep apnea (adult) (pediatric): Secondary | ICD-10-CM | POA: Diagnosis not present

## 2023-10-30 DIAGNOSIS — Z01818 Encounter for other preprocedural examination: Secondary | ICD-10-CM | POA: Insufficient documentation

## 2023-10-30 DIAGNOSIS — I2510 Atherosclerotic heart disease of native coronary artery without angina pectoris: Secondary | ICD-10-CM | POA: Diagnosis not present

## 2023-10-30 DIAGNOSIS — Z9189 Other specified personal risk factors, not elsewhere classified: Secondary | ICD-10-CM | POA: Diagnosis not present

## 2023-10-30 DIAGNOSIS — E785 Hyperlipidemia, unspecified: Secondary | ICD-10-CM | POA: Insufficient documentation

## 2023-10-30 DIAGNOSIS — Z86718 Personal history of other venous thrombosis and embolism: Secondary | ICD-10-CM | POA: Diagnosis not present

## 2023-10-30 DIAGNOSIS — G8929 Other chronic pain: Secondary | ICD-10-CM | POA: Diagnosis not present

## 2023-10-30 DIAGNOSIS — R0609 Other forms of dyspnea: Secondary | ICD-10-CM

## 2023-10-30 DIAGNOSIS — I25118 Atherosclerotic heart disease of native coronary artery with other forms of angina pectoris: Secondary | ICD-10-CM | POA: Diagnosis not present

## 2023-10-30 NOTE — Therapy (Deleted)
 OUTPATIENT PHYSICAL THERAPY LOWER EXTREMITY EVALUATION   Patient Name: Rita Bass MRN: [id number] DOB:01/26/1966, 58 y.o., female Today's Date: 10/30/2023  END OF SESSION:   Past Medical History:  Diagnosis Date   Arthritis    Asthma    Chronic pain of left knee    Complication of anesthesia    PONV   Diabetes (HCC)    GERD (gastroesophageal reflux disease)    H/O bronchitis    Hepatic steatosis    Internal hemorrhoids    Kidney stones    Obesity    Renal disorder    Shortness of breath dyspnea    Sleep apnea    past issues - at weight over 400lbs   Sleep apnea in adult    Polysomnogram pending.  Followed by Dr. Shellia   Past Surgical History:  Procedure Laterality Date   BIOPSY  11/01/2020   Procedure: BIOPSY;  Surgeon: San Sandor GAILS, DO;  Location: [facility];  Service: Gastroenterology;;   BIOPSY  05/11/2021   Procedure: BIOPSY;  Surgeon: San Sandor GAILS, DO;  Location: [facility];  Service: Gastroenterology;;   BIOPSY  11/29/2022   Procedure: BIOPSY;  Surgeon: San Sandor GAILS, DO;  Location: [facility];  Service: Gastroenterology;;   CESAREAN SECTION     6 c-sections   CHOLECYSTECTOMY     COLONOSCOPY WITH PROPOFOL  N/A 05/11/2021   Procedure: COLONOSCOPY WITH PROPOFOL ;  Surgeon: San Sandor GAILS, DO;  Location: [facility];  Service: Gastroenterology;  Laterality: N/A;   ESOPHAGOGASTRODUODENOSCOPY (EGD) WITH PROPOFOL  N/A 11/01/2020   Procedure: ESOPHAGOGASTRODUODENOSCOPY (EGD) WITH PROPOFOL ;  Surgeon: San Sandor GAILS, DO;  Location: [facility];  Service: Gastroenterology;  Laterality: N/A;   ESOPHAGOGASTRODUODENOSCOPY (EGD) WITH PROPOFOL  N/A 11/29/2022   Procedure: ESOPHAGOGASTRODUODENOSCOPY (EGD) WITH PROPOFOL ;  Surgeon: San Sandor GAILS, DO;  Location: [facility];  Service: Gastroenterology;  Laterality: N/A;   HERNIA REPAIR     KIDNEY STONE SURGERY     OOPHORECTOMY     TRANSTHORACIC ECHOCARDIOGRAM  09/2017   Technically  difficult study.  Did not use Definity  contrast.  EF was 55 to 60% with moderate LVH and grade 1 diastolic dysfunction.  No significant valvular lesions noted.  No regional wall motion normality but difficult to assess due to poor imaging.    TUBAL LIGATION     UPPER GASTROINTESTINAL ENDOSCOPY     Patient Active Problem List   Diagnosis Date Noted   Palpitations 06/27/2023   Coronary artery disease involving native coronary artery of native heart without angina pectoris 06/27/2023   Sleep disturbance 06/20/2023   Dyspnea 03/19/2023   Shortness of breath 03/18/2023   Chronic abdominal pain 12/24/2022   Medication management 12/21/2022   Chronic Helicobacter pylori gastritis 11/29/2022   Gastric ulcer without hemorrhage or perforation 11/29/2022   Gastroesophageal reflux disease with esophagitis without hemorrhage 11/29/2022   Change in bowel habits 11/29/2022   Diarrhea 11/29/2022   Asthma 10/22/2022   Primary osteoarthritis of both knees 10/03/2022   Wound check, abscess 07/21/2022   Type 2 diabetes mellitus with diabetic neuropathy, unspecified (HCC) 04/30/2022   Chronic osteoarthritis 04/30/2022   Rash 01/15/2022   External hemorrhoid 09/15/2021   Generalized abdominal pain 08/30/2021   Ventral hernia 08/16/2021   Allergic conjunctivitis 07/28/2021   Grade II internal hemorrhoids    H. 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  Rationale for Evaluation and Treatment: Rehabilitation  ONSET DATE: ***  SUBJECTIVE:   SUBJECTIVE STATEMENT: ***  PERTINENT HISTORY: CAD, chronic diastolic heart failure, PSVT, DVT, hypertension, hyperlipidemia, type 2 diabetes, OSA, and obesity   PAIN:  Are you having pain?  Yes: NPRS scale: *** Pain location: *** Pain description: *** Aggravating factors: *** Relieving factors: ***  PRECAUTIONS: {Therapy precautions:24002}  RED FLAGS: {PT Red Flags:29287}   WEIGHT BEARING RESTRICTIONS: {Yes ***/No:24003}  FALLS:  Has patient fallen in last 6 months? {fallsyesno:27318}  LIVING ENVIRONMENT: Lives with: {[facility] lives with:25569::lives with their family} Lives in: {Lives in:25570} Stairs: {opstairs:27293} Has following equipment at home: {Assistive devices:23999}  OCCUPATION: ***  PLOF: {PLOF:24004}  PATIENT GOALS: ***  NEXT MD VISIT: ***  OBJECTIVE:  Note: Objective measures were completed at Evaluation unless otherwise noted.  DIAGNOSTIC FINDINGS: ***  PATIENT SURVEYS:  LEFS: ***/80  COGNITION: Overall cognitive status: {cognition:24006}     SENSATION: {sensation:27233}  EDEMA:  {edema:24020}  MUSCLE LENGTH: Hamstrings: Right *** deg; Left *** deg Rita Bass: Right *** deg; Left *** deg  POSTURE: {posture:25561}  PALPATION: ***  LOWER EXTREMITY ROM:  {AROM/PROM:27142} ROM Right eval Left eval  Hip  flexion    Hip extension    Hip abduction    Hip adduction    Hip internal rotation    Hip external rotation    Knee flexion    Knee extension    Ankle dorsiflexion    Ankle plantarflexion    Ankle inversion    Ankle eversion     (Blank rows = not tested)  LOWER EXTREMITY MMT:  MMT Right eval Left eval  Hip flexion    Hip extension    Hip abduction    Hip adduction    Hip internal rotation    Hip external rotation    Knee flexion    Knee extension    Ankle dorsiflexion    Ankle plantarflexion    Ankle inversion    Ankle eversion     (Blank rows = not tested)  LOWER EXTREMITY SPECIAL TESTS:  {LEspecialtests:26242}  FUNCTIONAL TESTS:  {Functional tests:24029}  GAIT: Distance walked: *** Assistive device utilized: {Assistive devices:23999} Level of assistance: {Levels of assistance:24026} Comments: ***   TREATMENT: [facility] Adult PT Treatment:                                                DATE: *** Therapeutic Exercise: *** Manual  Therapy: *** Neuromuscular re-ed: *** Therapeutic Activity: *** Modalities: *** Self Care: ***  PATIENT EDUCATION:  Education details: *** Person educated: {Person educated:25204} Education method: {Education Method:25205} Education comprehension: {Education Comprehension:25206}  HOME EXERCISE PROGRAM: ***  ASSESSMENT:  CLINICAL IMPRESSION: Patient is a *** y.o. *** who was seen today for physical therapy evaluation and treatment for ***.   OBJECTIVE IMPAIRMENTS: {opptimpairments:25111}.   ACTIVITY LIMITATIONS: {activitylimitations:27494}  PARTICIPATION LIMITATIONS: {participationrestrictions:25113}  PERSONAL FACTORS: {Personal factors:25162} are also affecting patient's functional outcome.   REHAB POTENTIAL: {rehabpotential:25112}  CLINICAL DECISION MAKING: {clinical decision making:25114}  EVALUATION COMPLEXITY: {Evaluation complexity:25115}   GOALS: Goals reviewed with patient? No  SHORT TERM GOALS:  Target date: 11/20/2023   Pt will be compliant and knowledgeable with initial HEP for improved comfort and carryover Baseline: initial HEP given Goal status: INITIAL  2.  Pt will self report *** pain no greater than ***/10 for improved comfort and functional ability Baseline: ***/10 at worst Goal status: {GOALSTATUS:25110}   LONG TERM GOALS: Target date: 12/25/2023   Pt will improve LEFS to no less than ***/80 as proxy for functional improvement with home ADLs and higher level community activity Baseline: ***/80 Goal status: {GOALSTATUS:25110}   2.  Pt will self report *** pain no greater than ***/10 for improved comfort and functional ability Baseline: ***/10 at worst Goal status: {GOALSTATUS:25110}   3.  Pt will increase 30 Second Sit to Stand rep count to no less than *** reps for improved balance, strength, and functional mobility Baseline: *** reps  Goal status: {GOALSTATUS:25110}   4.  *** Baseline:  Goal status: INITIAL  5.  *** Baseline:  Goal status: INITIAL  6.  *** Baseline:  Goal status: INITIAL   PLAN:  PT FREQUENCY: {rehab frequency:25116}  PT DURATION: {rehab duration:25117}  PLANNED INTERVENTIONS: {rehab planned interventions:25118::97110-Therapeutic exercises,97530- Therapeutic [phone]- Neuromuscular re-education,97535- Self Rjmz,02859- Manual therapy}  PLAN FOR NEXT SESSION: PIERRETTE Alm JAYSON Johna, PT 10/30/2023, 3:13 PM

## 2023-10-30 NOTE — H&P (View-Only) (Signed)
 Office Visit    Patient Name: Rita Bass Date of Encounter: 10/30/2023  Primary Care Provider:  [name], MD Primary Cardiologist:  Alm Clay, MD  Chief Complaint    58 year old female with history of CAD, chronic diastolic heart failure, PSVT, DVT, hypertension, hyperlipidemia, type 2 diabetes, OSA, and obesity who presents for follow-up related to CAD.  Past Medical History    Past Medical History:  Diagnosis Date   Arthritis    Asthma    Chronic pain of left knee    Complication of anesthesia    PONV   Diabetes (HCC)    GERD (gastroesophageal reflux disease)    H/O bronchitis    Hepatic steatosis    Internal hemorrhoids    Kidney stones    Obesity    Renal disorder    Shortness of breath dyspnea    Sleep apnea    past issues - at weight over 400lbs   Sleep apnea in adult    Polysomnogram pending.  Followed by Dr. Shellia   Past Surgical History:  Procedure Laterality Date   BIOPSY  11/01/2020   Procedure: BIOPSY;  Surgeon: San Sandor GAILS, DO;  Location: [facility];  Service: Gastroenterology;;   BIOPSY  05/11/2021   Procedure: BIOPSY;  Surgeon: San Sandor GAILS, DO;  Location: [facility];  Service: Gastroenterology;;   BIOPSY  11/29/2022   Procedure: BIOPSY;  Surgeon: San Sandor GAILS, DO;  Location: [facility];  Service: Gastroenterology;;   CESAREAN SECTION     6 c-sections   CHOLECYSTECTOMY     COLONOSCOPY WITH PROPOFOL  N/A 05/11/2021   Procedure: COLONOSCOPY WITH PROPOFOL ;  Surgeon: San Sandor GAILS, DO;  Location: [facility];  Service: Gastroenterology;  Laterality: N/A;   ESOPHAGOGASTRODUODENOSCOPY (EGD) WITH PROPOFOL  N/A 11/01/2020   Procedure: ESOPHAGOGASTRODUODENOSCOPY (EGD) WITH PROPOFOL ;  Surgeon: San Sandor GAILS, DO;  Location: [facility];  Service: Gastroenterology;  Laterality: N/A;   ESOPHAGOGASTRODUODENOSCOPY (EGD) WITH PROPOFOL  N/A 11/29/2022   Procedure: ESOPHAGOGASTRODUODENOSCOPY (EGD) WITH PROPOFOL ;   Surgeon: San Sandor GAILS, DO;  Location: [facility];  Service: Gastroenterology;  Laterality: N/A;   HERNIA REPAIR     KIDNEY STONE SURGERY     OOPHORECTOMY     TRANSTHORACIC ECHOCARDIOGRAM  09/2017   Technically difficult study.  Did not use Definity  contrast.  EF was 55 to 60% with moderate LVH and grade 1 diastolic dysfunction.  No significant valvular lesions noted.  No regional wall motion normality but difficult to assess due to poor imaging.    TUBAL LIGATION     UPPER GASTROINTESTINAL ENDOSCOPY      Allergies  No Known Allergies   Labs/Other Studies Reviewed    The following studies were reviewed today:  Cardiac Studies & Procedures   ______________________________________________________________________________________________     ECHOCARDIOGRAM  ECHOCARDIOGRAM COMPLETE 03/24/2023  Narrative ECHOCARDIOGRAM REPORT    Patient Name:   Rita Bass Date of Exam: 03/24/2023 Medical Rec #:  [id number]             Height:       64.0 in Accession #:    [id number]            Weight:       326.0 lb Date of Birth:  12/19/1965             BSA:          2.408 m Patient Age:    57 years  BP:           124/82 mmHg Patient Gender: F                     HR:           82 bpm. Exam Location:  Inpatient  Procedure: 2D Echo, Cardiac Doppler, Color Doppler and Intracardiac Opacification Agent  Indications:    CP  History:        Patient has prior history of Echocardiogram examinations, most recent 09/18/2017. Signs/Symptoms:Dyspnea and Shortness of Breath; Risk Factors:Sleep Apnea, Diabetes, Hypertension, Dyslipidemia and Non-Smoker.  Sonographer:    Juanita Shaw Referring Phys: [id number] Rita Bass  IMPRESSIONS   1. Images are very limited. 2. Left ventricular ejection fraction, by estimation, is 55 to 60%. The left ventricle has normal function. Left ventricular endocardial border not optimally defined to evaluate regional wall motion.  Left ventricular diastolic parameters are consistent with Grade I diastolic dysfunction (impaired relaxation). 3. Definity  contrast does not demonstrate any obvious wall motion abnormalities. 4. Right ventricular systolic function was not well visualized. The right ventricular size is not well visualized. Tricuspid regurgitation signal is inadequate for assessing PA pressure. 5. The mitral valve was not well visualized. Trivial mitral valve regurgitation. 6. The aortic valve was not well visualized. Aortic valve regurgitation is not visualized. Aortic valve mean gradient measures 3.0 mmHg. 7. Unable to estimate CVP.  Comparison(s): Prior images unable to be directly viewed.  FINDINGS Left Ventricle: Left ventricular ejection fraction, by estimation, is 55 to 60%. The left ventricle has normal function. Left ventricular endocardial border not optimally defined to evaluate regional wall motion. The left ventricular internal cavity size was normal in size. Suboptimal image quality limits for assessment of left ventricular hypertrophy. Left ventricular diastolic parameters are consistent with Grade I diastolic dysfunction (impaired relaxation).  Right Ventricle: The right ventricular size is not well visualized. Right vetricular wall thickness was not well visualized. Right ventricular systolic function was not well visualized. Tricuspid regurgitation signal is inadequate for assessing PA pressure.  Left Atrium: Left atrial size was normal in size.  Right Atrium: Right atrial size was normal in size.  Pericardium: The pericardium was not well visualized.  Mitral Valve: The mitral valve was not well visualized. Trivial mitral valve regurgitation. MV peak gradient, 2.9 mmHg. The mean mitral valve gradient is 1.0 mmHg.  Tricuspid Valve: The tricuspid valve is not well visualized. Tricuspid valve regurgitation is trivial.  Aortic Valve: The aortic valve was not well visualized. Aortic valve  regurgitation is not visualized. Aortic valve mean gradient measures 3.0 mmHg. Aortic valve peak gradient measures 6.1 mmHg. Aortic valve area, by VTI measures 3.24 cm.  Pulmonic Valve: The pulmonic valve was not well visualized. Pulmonic valve regurgitation is not visualized.  Aorta: The aortic root and ascending aorta are structurally normal, with no evidence of dilitation.  Venous: Unable to estimate CVP. The inferior vena cava was not well visualized.  IAS/Shunts: The interatrial septum was not well visualized.   LEFT VENTRICLE PLAX 2D LVIDd:         3.90 cm     Diastology LVIDs:         2.70 cm     LV e' medial:    7.72 cm/s LV PW:         0.90 cm     LV E/e' medial:  7.6 LV IVS:        0.80 cm     LV e'  lateral:   8.92 cm/s LVOT diam:     1.90 cm     LV E/e' lateral: 6.6 LV SV:         68 LV SV Index:   28 LVOT Area:     2.84 cm  LV Volumes (MOD) LV vol d, MOD A2C: 93.9 ml LV vol d, MOD A4C: 85.7 ml LV vol s, MOD A2C: 43.9 ml LV vol s, MOD A4C: 40.7 ml LV SV MOD A2C:     50.0 ml LV SV MOD A4C:     85.7 ml LV SV MOD BP:      49.5 ml  RIGHT VENTRICLE RV S prime:     12.10 cm/s TAPSE (M-mode): 2.0 cm  LEFT ATRIUM           Index LA diam:      3.40 cm 1.41 cm/m LA Vol (A2C): 46.9 ml 19.48 ml/m LA Vol (A4C): 21.6 ml 8.97 ml/m AORTIC VALVE                    PULMONIC VALVE AV Area (Vmax):    2.44 cm     PV Vmax:       1.25 m/s AV Area (Vmean):   2.34 cm     PV Peak grad:  6.3 mmHg AV Area (VTI):     3.24 cm AV Vmax:           123.00 cm/s AV Vmean:          87.300 cm/s AV VTI:            0.209 m AV Peak Grad:      6.1 mmHg AV Mean Grad:      3.0 mmHg LVOT Vmax:         106.00 cm/s LVOT Vmean:        71.900 cm/s LVOT VTI:          0.239 m LVOT/AV VTI ratio: 1.14  AORTA Ao Root diam: 3.00 cm Ao Asc diam:  3.00 cm  MITRAL VALVE MV Area (PHT): 3.08 cm    SHUNTS MV Area VTI:   3.95 cm    Systemic VTI:  0.24 m MV Peak grad:  2.9 mmHg    Systemic Diam:  1.90 cm MV Mean grad:  1.0 mmHg MV Vmax:       0.84 m/s MV Vmean:      55.1 cm/s MV Decel Time: 246 msec MV E velocity: 58.70 cm/s MV A velocity: 72.80 cm/s MV E/A ratio:  0.81  Jayson Sierras MD Electronically signed by Jayson Sierras MD Signature Date/Time: 03/24/2023/2:36:11 PM    Final      CT SCANS  CT CORONARY MORPH W/CTA COR W/SCORE 10/17/2023  Addendum 10/18/2023 10:40 PM ADDENDUM REPORT: 10/18/2023 22:38  ADDENDUM: Limited non-coronary overread as follows:  Cardiovascular: No significant extracardiac vascular findings. Normal heart size. No pericardial effusion.  Limited Mediastinum/Nodes: No enlarged mediastinal, hilar, or axillary lymph nodes. Trachea and esophagus demonstrate no significant findings.  Limited Lungs/Pleura: Lungs are clear. No pleural effusion or pneumothorax.  Upper Abdomen: No acute abnormality.  Musculoskeletal: No chest wall abnormality. No acute osseous findings.  IMPRESSION: No acute extracardiac findings in the included chest.   Electronically Signed By: Marolyn JONETTA Jaksch M.D. On: 10/18/2023 22:38  Narrative CLINICAL DATA:  58 Year-old Female  EXAM: Cardiac/Coronary CTA  TECHNIQUE: A non-contrast, gated CT scan was obtained with axial slices of 2.5 mm through the heart for calcium  scoring. Calcium  scoring  was performed using the Agatston method. A 120 kV prospective, gated, contrast cardiac CT scan was obtained. Gantry rotation speed was 230 msec and collimation was 0.63 mm. Two sublingual nitroglycerin  tablets (0.8 mg) were given. The 3D data set was reconstructed with motion correction for the best systolic or diastolic phase. Images were analyzed on a dedicated workstation using MPR, MIP, and VRT modes. The patient received 100 cc of contrast.  FINDINGS: Coronary Arteries:  Normal coronary origin.  Right dominance.  Coronary Calcium  Score:  Left main: 0  Left anterior descending artery: 129  Left circumflex  artery: 0  Right coronary artery: 2  Ramus intermedius artery: 0  Total: 131  Percentile: 95th for age, sex, and race matched control.  Left main: The left main is a large caliber vessel with a normal take off from the left coronary cusp that trifurcates to form a left anterior descending artery and a left circumflex artery, and an RI vessel. There is no significant plaque.  Left anterior descending artery: The LAD is a large caliber vessel with one large bifurcating diagonal. Mild calcified plaque (25-49%) in the proximal LAD. Minimal soft plaque in the mid LAD. Moderate soft plaque stenosis in the distal LAD.  Left circumflex artery: The LCX is non-dominant. There is no significant plaque.  Right coronary artery: The RCA is dominant with normal take off from the right coronary cusp. The RCA terminates as a PDA and right posterolateral branch. There is no significant plaque.  There is a ramus intermedius vessel with no significant plaque (small vessel).  Other non-coronary findings:  Right Atrium: Right atrial size is dilated.  Right Ventricle: The right ventricular cavity is within normal limits.  Left Atrium: Left atrial size is normal in size with no left atrial appendage filling defect.  Left Ventricle: The ventricular cavity size is within normal limits.  Interatrial septum: No PFO or ASD.  Main Pulmonary Artery: Normal size of the pulmonary artery.  Pulmonary veins: Normal pulmonary venous drainage.  Systemic veins: Normal venous drainage.  Pericardium: Normal thickness without significant effusion or calcium  present.  Cardiac valves: The aortic valve is tri-leaflet without significant calcification. The mitral valve is normal without significant calcification.  Aorta: Normal caliber without significant calcifications.  Extra-cardiac findings: See attached radiology report for non-cardiac structures.  Artifact: NA  Image quality:  Excellent  IMPRESSION: 1. Coronary calcium  score of 131. This was 95th percentile for age, sex, and race matched control.  2. Normal coronary origin with right dominance.  3. CAD-RADS 3. Moderate stenosis. Consider symptom-guided anti-ischemic pharmacotherapy as well as risk factor modification per guideline directed care. Additional analysis with CT FFR will be submitted.  RECOMMENDATIONS: RECOMMENDATIONS The proposed cut-off value of 1,651 AU yielded a 93 % sensitivity and 75 % specificity in grading AS severity in patients with classical low-flow, low-gradient AS. Proposed different cut-off values to define severe AS for men and women as 2,065 AU and 1,274 AU, respectively. The joint European and American recommendations for the assessment of AS consider the aortic valve calcium  score as a continuum - a very high calcium  score suggests severe AS and a low calcium  score suggests severe AS is unlikely.  Donney VEAR Jarome LULLA Stephen RENETTE, et al. 2017 ESC/EACTS Guidelines for the management of valvular heart disease. Eur Heart J 2017;38:2739-91.  Coronary artery calcium  (CAC) score is a strong predictor of incident coronary heart disease (CHD) and provides predictive information beyond traditional risk factors. CAC scoring is reasonable to use in  the decision to withhold, postpone, or initiate statin therapy in intermediate-risk or selected borderline-risk asymptomatic adults (age 3-75 years and LDL-C >=70 to <190 mg/dL) who do not have diabetes or established atherosclerotic cardiovascular disease (ASCVD).* In intermediate-risk (10-year ASCVD risk >=7.5% to <20%) adults or selected borderline-risk (10-year ASCVD risk >=5% to <7.5%) adults in whom a CAC score is measured for the purpose of making a treatment decision the following recommendations have been made:  If CAC = 0, it is reasonable to withhold statin therapy and reassess in 5 to 10 years, as long as higher risk  conditions are absent (diabetes mellitus, family history of premature CHD in first degree relatives (males <55 years; females <65 years), cigarette smoking, LDL >=190 mg/dL or other independent risk factors).  If CAC is 1 to 99, it is reasonable to initiate statin therapy for patients >=66 years of age.  If CAC is >=100 or >=75th percentile, it is reasonable to initiate statin therapy at any age.  Cardiology referral should be considered for patients with CAC scores =400 or >=75th percentile.  *2018 AHA/ACC/AACVPR/AAPA/ABC/ACPM/ADA/AGS/APhA/ASPC/NLA/PCNA Guideline on the Management of Blood Cholesterol: A Report of the American College of Cardiology/American Heart Association Task Force on Clinical Practice Guidelines. J Am Coll Cardiol. 2019;73(24):3168-3209.  Stanly Leavens, MD  Electronically Signed: By: Stanly Leavens M.D. On: 10/18/2023 12:05     ______________________________________________________________________________________________     Recent Labs: 03/19/2023: Magnesium  2.0 04/24/2023: ALT 24; B Natriuretic Peptide 22.3 09/25/2023: BUN 11; Creatinine, Ser 0.61; Hemoglobin 13.8; Platelets 307; Potassium 4.3; Sodium 137  Recent Lipid Panel    Component Value Date/Time   CHOL 192 06/21/2020 1547   TRIG 131 06/21/2020 1547   HDL 54 06/21/2020 1547   CHOLHDL 3.6 06/21/2020 1547   LDLCALC 115 (H) 06/21/2020 1547   LDLDIRECT 117 (H) 02/28/2021 9082    History of Present Illness    58 year old female with the above past medical history including CAD, chronic diastolic heart failure, PSVT, DVT, hypertension, hyperlipidemia, type 2 diabetes, OSA, and obesity.   Cardiopulmonary exercise test in 2019 showed no evidence of cardiac limitation.  Exercise intolerance was noted to be due to obesity.  She has evidence of coronary artery calcification noted on CT chest in 2024.  Echocardiogram in 03/2023 showed EF 55 to 60%, normal LV function, no RWMA, G1 DD,  normal RV systolic function, no significant valvular abnormalities.  At her follow-up visit in 06/2023 she reported atypical chest pain with associated dizziness, blackouts. Cardiac monitor in 06/2023 showed predominantly sinus rhythm, first-degree AV block, 6 runs of SVT, PACs and PVCs, no significant arrhythmia.  Cardiac PET stress test was ordered but not completed due to denial by her insurance.  She was diagnosed with lower extremity DVT in 07/2023.  Lower extremity venous duplex on 09/25/2023 showed no evidence of DVT.  She remains on Xarelto .  She was last seen in the office on 09/27/2023 and reported ongoing intermittent left-sided chest pain, dyspnea on exertion. Coronary CTA in 09/2023 revealed coronary calcium  score 131 (95th percentile), CT FFR analysis showed significant functional stenosis in the distal LAD. Follow-up cardiac catheterization was recommended and scheduled for 11/05/2023.   She presents today for follow-up accompanied by her daughter.  Since her last visit she has been stable from a cardiac standpoint.  She continues to note intermittent chest discomfort, elevated heart rate, shortness of breath with exertion.  She is agreeable to proceed with cardiac catheterization though she states she would prefer to be observed  overnight in the hospital following her procedure.  Home Medications    Current Outpatient Medications  Medication Sig Dispense Refill   Accu-Chek Softclix Lancets lancets USE IN THE MORNING, AT NOON AND AT BEDTIME 100 each 12   acetaminophen  (TYLENOL ) 650 MG CR tablet Take 650-1,300 mg by mouth every 8 (eight) hours as needed for pain. Last dose (2) around 0930 am.     albuterol  (PROVENTIL ) (5 MG/ML) 0.5% nebulizer solution Take 0.5 mLs (2.5 mg total) by nebulization every 6 (six) hours as needed for wheezing or shortness of breath. 20 mL 0   Blood Glucose Monitoring Suppl DEVI 1 each by Does not apply route in the morning, at noon, and at bedtime. May substitute to any  manufacturer covered by patient's insurance. 1 each 0   Continuous Glucose Receiver (FREESTYLE LIBRE 3 READER) DEVI 1 Device as directed.     Continuous Glucose Sensor (FREESTYLE LIBRE 3 PLUS SENSOR) MISC Change sensor every 15 days. 2 each 11   cyclobenzaprine  (FLEXERIL ) 10 MG tablet Take 1 tablet (10 mg total) by mouth 2 (two) times daily as needed for muscle spasms. 60 tablet 0   diclofenac  Sodium (VOLTAREN ) 1 % GEL Apply 2 g topically 4 (four) times daily. 100 g 0   DULoxetine  (CYMBALTA ) 60 MG capsule Take 1 capsule (60 mg total) by mouth daily. 30 capsule 3   empagliflozin  (JARDIANCE ) 25 MG TABS tablet Take 1 tablet (25 mg total) by mouth daily. 90 tablet 3   gabapentin  (NEURONTIN ) 100 MG capsule Take 6 capsules (600 mg total) by mouth 3 (three) times daily. 360 capsule 2   glucose blood (ACCU-CHEK GUIDE) test strip 1 each by Other route daily. for testing 100 strip 1   insulin  glargine (LANTUS ) 100 UNIT/ML Solostar Pen Inject 30-50 Units into the skin every morning. Inject 30 units subcutaneously every morning. Increase by 2 units each day until sugar < 150. Maximum daily dose 50 units. 15 mL 5   Insulin  Pen Needle (PEN NEEDLES) 32G X 5 MM MISC Use as directed 30 each 3   ipratropium-albuterol  (DUONEB) 0.5-2.5 (3) MG/3ML SOLN Take 3 mLs by nebulization every 6 (six) hours as needed. (Patient taking differently: Take 3 mLs by nebulization every 6 (six) hours as needed. Takes when she is sick) 180 mL 0   metFORMIN  (GLUCOPHAGE -XR) 500 MG 24 hr tablet Take 2 tablets (1,000 mg total) by mouth 2 (two) times daily. 360 tablet 3   metoprolol  tartrate (LOPRESSOR ) 100 MG tablet Take 1 tablet 2 hours prior to cardiac ct 1 tablet 0   olopatadine  (PATANOL) 0.1 % ophthalmic solution Place 1 drop into both eyes 2 (two) times daily. 5 mL 12   pantoprazole  (PROTONIX ) 40 MG tablet Take 1 tablet (40 mg total) by mouth daily. Patient needs follow up appointment for future refills. Please call [phone] to  schedule an appointment. 90 tablet 0   rivaroxaban  (XARELTO ) 20 MG TABS tablet Take 1 tablet (20 mg total) by mouth daily with supper. Needs to have follow-up appointment before next refill. 30 tablet 0   Semaglutide , 2 MG/DOSE, (OZEMPIC , 2 MG/DOSE,) 8 MG/3ML SOPN Inject 2 mg into the skin once a week. 3 mL 5   spironolactone  (ALDACTONE ) 25 MG tablet Take 1 tablet (25 mg total) by mouth daily. 90 tablet 3   traMADol  (ULTRAM ) 50 MG tablet Take 1 tablet (50 mg total) by mouth every 12 (twelve) hours as needed. Not to be refilled before 30 days 60 tablet 0  traZODone  (DESYREL ) 50 MG tablet Take 1 tablet (50 mg total) by mouth at bedtime as needed for sleep. 30 tablet 3   hydrocortisone  (ANUSOL -HC) 2.5 % rectal cream Place rectally as needed for hemorrhoids or anal itching. (Patient not taking: Reported on 10/30/2023)     mometasone -formoterol  (DULERA ) 100-5 MCG/ACT AERO Inhale 2 puffs into the lungs daily. (Patient not taking: Reported on 10/30/2023) 39 g 3   nystatin  (MYCOSTATIN /NYSTOP ) powder Apply 1 Application topically 2 (two) times daily. (Patient not taking: Reported on 10/30/2023) 60 g 5   No current facility-administered medications for this visit.     Review of Systems    She denies pnd, orthopnea, n, v, dizziness, syncope, edema, weight gain, or early satiety. All other systems reviewed and are otherwise negative except as noted above.   Physical Exam    VS:  BP 104/66   Pulse (!) 109   Ht 5' 4 (1.626 m)   Wt (!) 311 lb 12.8 oz (141.4 kg)   LMP 04/05/2016   SpO2 96%   BMI 53.52 kg/m   GEN: Well nourished, well developed, in no acute distress. HEENT: normal. Neck: Supple, no JVD, carotid bruits, or masses. Cardiac: RRR, no murmurs, rubs, or gallops. No clubbing, cyanosis, edema.  Radials/DP/PT 2+ and equal bilaterally.  Respiratory:  Respirations regular and unlabored, clear to auscultation bilaterally. GI: Soft, nontender, nondistended, BS + x 4. MS: no deformity or  atrophy. Skin: warm and dry, no rash. Neuro:  Strength and sensation are intact. Psych: Normal affect.  Accessory Clinical Findings    ECG personally reviewed by me today - EKG Interpretation Date/Time:  Wednesday October 30 2023 13:51:06 EDT Ventricular Rate:  109 PR Interval:  168 QRS Duration:  100 QT Interval:  352 QTC Calculation: 474 R Axis:   -33  Text Interpretation: Sinus tachycardia Left axis deviation Nonspecific ST and T wave abnormality When compared with ECG of 24-Apr-2023 12:30, Nonspecific T wave abnormality now evident in Anterior leads Confirmed by Daneen Perkins ([id number]) on 10/30/2023 1:55:42 PM  - no acute changes.   Lab Results  Component Value Date   WBC 9.1 09/25/2023   HGB 13.8 09/25/2023   HCT 44.7 09/25/2023   MCV 75.0 (L) 09/25/2023   PLT 307 09/25/2023   Lab Results  Component Value Date   CREATININE 0.61 09/25/2023   BUN 11 09/25/2023   NA 137 09/25/2023   K 4.3 09/25/2023   CL 97 (L) 09/25/2023   CO2 24 09/25/2023   Lab Results  Component Value Date   ALT 24 04/24/2023   AST 20 04/24/2023   ALKPHOS 77 04/24/2023   BILITOT 0.4 04/24/2023   Lab Results  Component Value Date   CHOL 192 06/21/2020   HDL 54 06/21/2020   LDLCALC 115 (H) 06/21/2020   LDLDIRECT 117 (H) 02/28/2021   TRIG 131 06/21/2020   CHOLHDL 3.6 06/21/2020    Lab Results  Component Value Date   HGBA1C 10.4 (A) 06/18/2023    Assessment & Plan    1. CAD/chest pain/dyspnea on exertion: Cardiopulmonary exercise test in 2019 showed no evidence of cardiac limitation.  Exercise intolerance was noted to be due to obesity.  She has evidence of coronary artery calcification noted on CT chest in 2024.  Cardiac PET stress test was previously ordered ordered but not completed (was denied by her insurance). Coronary CTA in 09/2023 revealed coronary calcium  score 131 (95th percentile), CT FFR analysis showed significant functional stenosis in the distal LAD. Follow-up cardiac  catheterization was recommended. She continues to note intermittent left-sided chest pain, dyspnea on exertion, elevated heart rate with minimal exertion, with associated tightness in her neck. Left heart cath scheduled for 11/05/2023 with Dr. Anner. Repeat fasting lipids pending.  We discussed procedure details, she is agreeable to proceed. She will hold her medications as below prior to cath:  11/02/23 hold Jardiance  11/03/23 hold Ozempic  11/03/23 hold Xarelto  8/19-8/21/25 hold Metformin  11/05/23 hold insulin  morning of procedure   Informed Consent   Shared Decision Making/Informed Consent The risks [stroke (1 in 1000), death (1 in 1000), kidney failure [usually temporary] (1 in 500), bleeding (1 in 200), allergic reaction [possibly serious] (1 in 200)], benefits (diagnostic support and management of coronary artery disease) and alternatives of a cardiac catheterization were discussed in detail with Ms. Common and she is willing to proceed.    2. Chronic diastolic heart failure: Echocardiogram in 03/2023 showed EF 55 to 60%, normal LV function, no RWMA, G1 DD, normal RV systolic function, no significant valvular abnormalities.  She does report dyspnea on exertion as above, pending LHC. Generally euvolemic and well compensated on exam.  Continue Jardiance , spironolactone .   3. Palpitations/PSVT: Cardiac monitor in 06/2023 showed predominantly sinus rhythm, first-degree AV block, 6 runs of SVT, PACs and PVCs, no significant arrhythmia.  She does report elevated heart rate with minimal activity. BP low in office today, continue to monitor BP/HR, consider initiation of beta-blocker therapy pending LHC.   4. History of DVT: She was diagnosed with lower extremity DVT in 07/2023.  Lower extremity venous duplex on 09/25/2023 showed no evidence of DVT.  She remains on Xarelto .   5. Hypertension: BP well controlled, borderline low. Continue current antihypertensive regimen.    6. Hyperlipidemia: LDL was 117  in 02/2021, above goal.  No recent LDL on file.  Will update fasting lipids, CMET today. Previously on atorvastatin .  Will address lipid-lowering therapy once lab results available.   7. Type 2 diabetes/obesity: A1c was 10.4 in 06/2023. Monitored and managed per PCP. Continue to encourage lifestyle modifications with diet and increased activity as tolerated.   8. OSA: Not on CPAP.    9. Disposition: Follow-up as scheduled with Dr. Anner in 11/2023.      Damien JAYSON Braver, NP 10/30/2023, 1:57 PM

## 2023-10-30 NOTE — Progress Notes (Signed)
 Office Visit    Patient Name: Rita Bass Date of Encounter: 10/30/2023  Primary Care Provider:  [name], MD Primary Cardiologist:  Alm Clay, MD  Chief Complaint    58 year old female with history of CAD, chronic diastolic heart failure, PSVT, DVT, hypertension, hyperlipidemia, type 2 diabetes, OSA, and obesity who presents for follow-up related to CAD.  Past Medical History    Past Medical History:  Diagnosis Date   Arthritis    Asthma    Chronic pain of left knee    Complication of anesthesia    PONV   Diabetes (HCC)    GERD (gastroesophageal reflux disease)    H/O bronchitis    Hepatic steatosis    Internal hemorrhoids    Kidney stones    Obesity    Renal disorder    Shortness of breath dyspnea    Sleep apnea    past issues - at weight over 400lbs   Sleep apnea in adult    Polysomnogram pending.  Followed by Dr. Shellia   Past Surgical History:  Procedure Laterality Date   BIOPSY  11/01/2020   Procedure: BIOPSY;  Surgeon: San Sandor GAILS, DO;  Location: [facility];  Service: Gastroenterology;;   BIOPSY  05/11/2021   Procedure: BIOPSY;  Surgeon: San Sandor GAILS, DO;  Location: [facility];  Service: Gastroenterology;;   BIOPSY  11/29/2022   Procedure: BIOPSY;  Surgeon: San Sandor GAILS, DO;  Location: [facility];  Service: Gastroenterology;;   CESAREAN SECTION     6 c-sections   CHOLECYSTECTOMY     COLONOSCOPY WITH PROPOFOL  N/A 05/11/2021   Procedure: COLONOSCOPY WITH PROPOFOL ;  Surgeon: San Sandor GAILS, DO;  Location: [facility];  Service: Gastroenterology;  Laterality: N/A;   ESOPHAGOGASTRODUODENOSCOPY (EGD) WITH PROPOFOL  N/A 11/01/2020   Procedure: ESOPHAGOGASTRODUODENOSCOPY (EGD) WITH PROPOFOL ;  Surgeon: San Sandor GAILS, DO;  Location: [facility];  Service: Gastroenterology;  Laterality: N/A;   ESOPHAGOGASTRODUODENOSCOPY (EGD) WITH PROPOFOL  N/A 11/29/2022   Procedure: ESOPHAGOGASTRODUODENOSCOPY (EGD) WITH PROPOFOL ;   Surgeon: San Sandor GAILS, DO;  Location: [facility];  Service: Gastroenterology;  Laterality: N/A;   HERNIA REPAIR     KIDNEY STONE SURGERY     OOPHORECTOMY     TRANSTHORACIC ECHOCARDIOGRAM  09/2017   Technically difficult study.  Did not use Definity  contrast.  EF was 55 to 60% with moderate LVH and grade 1 diastolic dysfunction.  No significant valvular lesions noted.  No regional wall motion normality but difficult to assess due to poor imaging.    TUBAL LIGATION     UPPER GASTROINTESTINAL ENDOSCOPY      Allergies  No Known Allergies   Labs/Other Studies Reviewed    The following studies were reviewed today:  Cardiac Studies & Procedures   ______________________________________________________________________________________________     ECHOCARDIOGRAM  ECHOCARDIOGRAM COMPLETE 03/24/2023  Narrative ECHOCARDIOGRAM REPORT    Patient Name:   Rita Bass Date of Exam: 03/24/2023 Medical Rec #:  [id number]             Height:       64.0 in Accession #:    [id number]            Weight:       326.0 lb Date of Birth:  [birth year]             BSA:          2.408 m Patient Age:    57 years  BP:           124/82 mmHg Patient Gender: F                     HR:           82 bpm. Exam Location:  Inpatient  Procedure: 2D Echo, Cardiac Doppler, Color Doppler and Intracardiac Opacification Agent  Indications:    CP  History:        Patient has prior history of Echocardiogram examinations, most recent 09/18/2017. Signs/Symptoms:Dyspnea and Shortness of Breath; Risk Factors:Sleep Apnea, Diabetes, Hypertension, Dyslipidemia and Non-Smoker.  Sonographer:    Juanita Shaw Referring Phys: [id number] SROBONA TUBLU CHATTERJEE  IMPRESSIONS   1. Images are very limited. 2. Left ventricular ejection fraction, by estimation, is 55 to 60%. The left ventricle has normal function. Left ventricular endocardial border not optimally defined to evaluate regional wall motion.  Left ventricular diastolic parameters are consistent with Grade I diastolic dysfunction (impaired relaxation). 3. Definity  contrast does not demonstrate any obvious wall motion abnormalities. 4. Right ventricular systolic function was not well visualized. The right ventricular size is not well visualized. Tricuspid regurgitation signal is inadequate for assessing PA pressure. 5. The mitral valve was not well visualized. Trivial mitral valve regurgitation. 6. The aortic valve was not well visualized. Aortic valve regurgitation is not visualized. Aortic valve mean gradient measures 3.0 mmHg. 7. Unable to estimate CVP.  Comparison(s): Prior images unable to be directly viewed.  FINDINGS Left Ventricle: Left ventricular ejection fraction, by estimation, is 55 to 60%. The left ventricle has normal function. Left ventricular endocardial border not optimally defined to evaluate regional wall motion. The left ventricular internal cavity size was normal in size. Suboptimal image quality limits for assessment of left ventricular hypertrophy. Left ventricular diastolic parameters are consistent with Grade I diastolic dysfunction (impaired relaxation).  Right Ventricle: The right ventricular size is not well visualized. Right vetricular wall thickness was not well visualized. Right ventricular systolic function was not well visualized. Tricuspid regurgitation signal is inadequate for assessing PA pressure.  Left Atrium: Left atrial size was normal in size.  Right Atrium: Right atrial size was normal in size.  Pericardium: The pericardium was not well visualized.  Mitral Valve: The mitral valve was not well visualized. Trivial mitral valve regurgitation. MV peak gradient, 2.9 mmHg. The mean mitral valve gradient is 1.0 mmHg.  Tricuspid Valve: The tricuspid valve is not well visualized. Tricuspid valve regurgitation is trivial.  Aortic Valve: The aortic valve was not well visualized. Aortic valve  regurgitation is not visualized. Aortic valve mean gradient measures 3.0 mmHg. Aortic valve peak gradient measures 6.1 mmHg. Aortic valve area, by VTI measures 3.24 cm.  Pulmonic Valve: The pulmonic valve was not well visualized. Pulmonic valve regurgitation is not visualized.  Aorta: The aortic root and ascending aorta are structurally normal, with no evidence of dilitation.  Venous: Unable to estimate CVP. The inferior vena cava was not well visualized.  IAS/Shunts: The interatrial septum was not well visualized.   LEFT VENTRICLE PLAX 2D LVIDd:         3.90 cm     Diastology LVIDs:         2.70 cm     LV e' medial:    7.72 cm/s LV PW:         0.90 cm     LV E/e' medial:  7.6 LV IVS:        0.80 cm     LV e'  lateral:   8.92 cm/s LVOT diam:     1.90 cm     LV E/e' lateral: 6.6 LV SV:         68 LV SV Index:   28 LVOT Area:     2.84 cm  LV Volumes (MOD) LV vol d, MOD A2C: 93.9 ml LV vol d, MOD A4C: 85.7 ml LV vol s, MOD A2C: 43.9 ml LV vol s, MOD A4C: 40.7 ml LV SV MOD A2C:     50.0 ml LV SV MOD A4C:     85.7 ml LV SV MOD BP:      49.5 ml  RIGHT VENTRICLE RV S prime:     12.10 cm/s TAPSE (M-mode): 2.0 cm  LEFT ATRIUM           Index LA diam:      3.40 cm 1.41 cm/m LA Vol (A2C): 46.9 ml 19.48 ml/m LA Vol (A4C): 21.6 ml 8.97 ml/m AORTIC VALVE                    PULMONIC VALVE AV Area (Vmax):    2.44 cm     PV Vmax:       1.25 m/s AV Area (Vmean):   2.34 cm     PV Peak grad:  6.3 mmHg AV Area (VTI):     3.24 cm AV Vmax:           123.00 cm/s AV Vmean:          87.300 cm/s AV VTI:            0.209 m AV Peak Grad:      6.1 mmHg AV Mean Grad:      3.0 mmHg LVOT Vmax:         106.00 cm/s LVOT Vmean:        71.900 cm/s LVOT VTI:          0.239 m LVOT/AV VTI ratio: 1.14  AORTA Ao Root diam: 3.00 cm Ao Asc diam:  3.00 cm  MITRAL VALVE MV Area (PHT): 3.08 cm    SHUNTS MV Area VTI:   3.95 cm    Systemic VTI:  0.24 m MV Peak grad:  2.9 mmHg    Systemic Diam:  1.90 cm MV Mean grad:  1.0 mmHg MV Vmax:       0.84 m/s MV Vmean:      55.1 cm/s MV Decel Time: 246 msec MV E velocity: 58.70 cm/s MV A velocity: 72.80 cm/s MV E/A ratio:  0.81  Jayson Sierras MD Electronically signed by Jayson Sierras MD Signature Date/Time: 03/24/2023/2:36:11 PM    Final      CT SCANS  CT CORONARY MORPH W/CTA COR W/SCORE 10/17/2023  Addendum 10/18/2023 10:40 PM ADDENDUM REPORT: 10/18/2023 22:38  ADDENDUM: Limited non-coronary overread as follows:  Cardiovascular: No significant extracardiac vascular findings. Normal heart size. No pericardial effusion.  Limited Mediastinum/Nodes: No enlarged mediastinal, hilar, or axillary lymph nodes. Trachea and esophagus demonstrate no significant findings.  Limited Lungs/Pleura: Lungs are clear. No pleural effusion or pneumothorax.  Upper Abdomen: No acute abnormality.  Musculoskeletal: No chest wall abnormality. No acute osseous findings.  IMPRESSION: No acute extracardiac findings in the included chest.   Electronically Signed By: Marolyn JONETTA Jaksch M.D. On: 10/18/2023 22:38  Narrative CLINICAL DATA:  58 Year-old Female  EXAM: Cardiac/Coronary CTA  TECHNIQUE: A non-contrast, gated CT scan was obtained with axial slices of 2.5 mm through the heart for calcium  scoring. Calcium  scoring  was performed using the Agatston method. A 120 kV prospective, gated, contrast cardiac CT scan was obtained. Gantry rotation speed was 230 msec and collimation was 0.63 mm. Two sublingual nitroglycerin  tablets (0.8 mg) were given. The 3D data set was reconstructed with motion correction for the best systolic or diastolic phase. Images were analyzed on a dedicated workstation using MPR, MIP, and VRT modes. The patient received 100 cc of contrast.  FINDINGS: Coronary Arteries:  Normal coronary origin.  Right dominance.  Coronary Calcium  Score:  Left main: 0  Left anterior descending artery: 129  Left circumflex  artery: 0  Right coronary artery: 2  Ramus intermedius artery: 0  Total: 131  Percentile: 95th for age, sex, and race matched control.  Left main: The left main is a large caliber vessel with a normal take off from the left coronary cusp that trifurcates to form a left anterior descending artery and a left circumflex artery, and an RI vessel. There is no significant plaque.  Left anterior descending artery: The LAD is a large caliber vessel with one large bifurcating diagonal. Mild calcified plaque (25-49%) in the proximal LAD. Minimal soft plaque in the mid LAD. Moderate soft plaque stenosis in the distal LAD.  Left circumflex artery: The LCX is non-dominant. There is no significant plaque.  Right coronary artery: The RCA is dominant with normal take off from the right coronary cusp. The RCA terminates as a PDA and right posterolateral branch. There is no significant plaque.  There is a ramus intermedius vessel with no significant plaque (small vessel).  Other non-coronary findings:  Right Atrium: Right atrial size is dilated.  Right Ventricle: The right ventricular cavity is within normal limits.  Left Atrium: Left atrial size is normal in size with no left atrial appendage filling defect.  Left Ventricle: The ventricular cavity size is within normal limits.  Interatrial septum: No PFO or ASD.  Main Pulmonary Artery: Normal size of the pulmonary artery.  Pulmonary veins: Normal pulmonary venous drainage.  Systemic veins: Normal venous drainage.  Pericardium: Normal thickness without significant effusion or calcium  present.  Cardiac valves: The aortic valve is tri-leaflet without significant calcification. The mitral valve is normal without significant calcification.  Aorta: Normal caliber without significant calcifications.  Extra-cardiac findings: See attached radiology report for non-cardiac structures.  Artifact: NA  Image quality:  Excellent  IMPRESSION: 1. Coronary calcium  score of 131. This was 95th percentile for age, sex, and race matched control.  2. Normal coronary origin with right dominance.  3. CAD-RADS 3. Moderate stenosis. Consider symptom-guided anti-ischemic pharmacotherapy as well as risk factor modification per guideline directed care. Additional analysis with CT FFR will be submitted.  RECOMMENDATIONS: RECOMMENDATIONS The proposed cut-off value of 1,651 AU yielded a 93 % sensitivity and 75 % specificity in grading AS severity in patients with classical low-flow, low-gradient AS. Proposed different cut-off values to define severe AS for men and women as 2,065 AU and 1,274 AU, respectively. The joint European and American recommendations for the assessment of AS consider the aortic valve calcium  score as a continuum - a very high calcium  score suggests severe AS and a low calcium  score suggests severe AS is unlikely.  Donney VEAR Jarome LULLA Stephen RENETTE, et al. 2017 ESC/EACTS Guidelines for the management of valvular heart disease. Eur Heart J 2017;38:2739-91.  Coronary artery calcium  (CAC) score is a strong predictor of incident coronary heart disease (CHD) and provides predictive information beyond traditional risk factors. CAC scoring is reasonable to use in  the decision to withhold, postpone, or initiate statin therapy in intermediate-risk or selected borderline-risk asymptomatic adults (age 2-75 years and LDL-C >=70 to <190 mg/dL) who do not have diabetes or established atherosclerotic cardiovascular disease (ASCVD).* In intermediate-risk (10-year ASCVD risk >=7.5% to <20%) adults or selected borderline-risk (10-year ASCVD risk >=5% to <7.5%) adults in whom a CAC score is measured for the purpose of making a treatment decision the following recommendations have been made:  If CAC = 0, it is reasonable to withhold statin therapy and reassess in 5 to 10 years, as long as higher risk  conditions are absent (diabetes mellitus, family history of premature CHD in first degree relatives (males <55 years; females <65 years), cigarette smoking, LDL >=190 mg/dL or other independent risk factors).  If CAC is 1 to 99, it is reasonable to initiate statin therapy for patients >=66 years of age.  If CAC is >=100 or >=75th percentile, it is reasonable to initiate statin therapy at any age.  Cardiology referral should be considered for patients with CAC scores =400 or >=75th percentile.  *2018 AHA/ACC/AACVPR/AAPA/ABC/ACPM/ADA/AGS/APhA/ASPC/NLA/PCNA Guideline on the Management of Blood Cholesterol: A Report of the American College of Cardiology/American Heart Association Task Force on Clinical Practice Guidelines. J Am Coll Cardiol. 2019;73(24):3168-3209.  Stanly Leavens, MD  Electronically Signed: By: Stanly Leavens M.D. On: 10/18/2023 12:05     ______________________________________________________________________________________________     Recent Labs: 03/19/2023: Magnesium  2.0 04/24/2023: ALT 24; B Natriuretic Peptide 22.3 09/25/2023: BUN 11; Creatinine, Ser 0.61; Hemoglobin 13.8; Platelets 307; Potassium 4.3; Sodium 137  Recent Lipid Panel    Component Value Date/Time   CHOL 192 06/21/2020 1547   TRIG 131 06/21/2020 1547   HDL 54 06/21/2020 1547   CHOLHDL 3.6 06/21/2020 1547   LDLCALC 115 (H) 06/21/2020 1547   LDLDIRECT 117 (H) 02/28/2021 9082    History of Present Illness    58 year old female with the above past medical history including CAD, chronic diastolic heart failure, PSVT, DVT, hypertension, hyperlipidemia, type 2 diabetes, OSA, and obesity.   Cardiopulmonary exercise test in 2019 showed no evidence of cardiac limitation.  Exercise intolerance was noted to be due to obesity.  She has evidence of coronary artery calcification noted on CT chest in 2024.  Echocardiogram in 03/2023 showed EF 55 to 60%, normal LV function, no RWMA, G1 DD,  normal RV systolic function, no significant valvular abnormalities.  At her follow-up visit in 06/2023 she reported atypical chest pain with associated dizziness, blackouts. Cardiac monitor in 06/2023 showed predominantly sinus rhythm, first-degree AV block, 6 runs of SVT, PACs and PVCs, no significant arrhythmia.  Cardiac PET stress test was ordered but not completed due to denial by her insurance.  She was diagnosed with lower extremity DVT in 07/2023.  Lower extremity venous duplex on 09/25/2023 showed no evidence of DVT.  She remains on Xarelto .  She was last seen in the office on 09/27/2023 and reported ongoing intermittent left-sided chest pain, dyspnea on exertion. Coronary CTA in 09/2023 revealed coronary calcium  score 131 (95th percentile), CT FFR analysis showed significant functional stenosis in the distal LAD. Follow-up cardiac catheterization was recommended and scheduled for 11/05/2023.   She presents today for follow-up accompanied by her daughter.  Since her last visit she has been stable from a cardiac standpoint.  She continues to note intermittent chest discomfort, elevated heart rate, shortness of breath with exertion.  She is agreeable to proceed with cardiac catheterization though she states she would prefer to be observed  overnight in the hospital following her procedure.  Home Medications    Current Outpatient Medications  Medication Sig Dispense Refill   Accu-Chek Softclix Lancets lancets USE IN THE MORNING, AT NOON AND AT BEDTIME 100 each 12   acetaminophen  (TYLENOL ) 650 MG CR tablet Take 650-1,300 mg by mouth every 8 (eight) hours as needed for pain. Last dose (2) around 0930 am.     albuterol  (PROVENTIL ) (5 MG/ML) 0.5% nebulizer solution Take 0.5 mLs (2.5 mg total) by nebulization every 6 (six) hours as needed for wheezing or shortness of breath. 20 mL 0   Blood Glucose Monitoring Suppl DEVI 1 each by Does not apply route in the morning, at noon, and at bedtime. May substitute to any  manufacturer covered by patient's insurance. 1 each 0   Continuous Glucose Receiver (FREESTYLE LIBRE 3 READER) DEVI 1 Device as directed.     Continuous Glucose Sensor (FREESTYLE LIBRE 3 PLUS SENSOR) MISC Change sensor every 15 days. 2 each 11   cyclobenzaprine  (FLEXERIL ) 10 MG tablet Take 1 tablet (10 mg total) by mouth 2 (two) times daily as needed for muscle spasms. 60 tablet 0   diclofenac  Sodium (VOLTAREN ) 1 % GEL Apply 2 g topically 4 (four) times daily. 100 g 0   DULoxetine  (CYMBALTA ) 60 MG capsule Take 1 capsule (60 mg total) by mouth daily. 30 capsule 3   empagliflozin  (JARDIANCE ) 25 MG TABS tablet Take 1 tablet (25 mg total) by mouth daily. 90 tablet 3   gabapentin  (NEURONTIN ) 100 MG capsule Take 6 capsules (600 mg total) by mouth 3 (three) times daily. 360 capsule 2   glucose blood (ACCU-CHEK GUIDE) test strip 1 each by Other route daily. for testing 100 strip 1   insulin  glargine (LANTUS ) 100 UNIT/ML Solostar Pen Inject 30-50 Units into the skin every morning. Inject 30 units subcutaneously every morning. Increase by 2 units each day until sugar < 150. Maximum daily dose 50 units. 15 mL 5   Insulin  Pen Needle (PEN NEEDLES) 32G X 5 MM MISC Use as directed 30 each 3   ipratropium-albuterol  (DUONEB) 0.5-2.5 (3) MG/3ML SOLN Take 3 mLs by nebulization every 6 (six) hours as needed. (Patient taking differently: Take 3 mLs by nebulization every 6 (six) hours as needed. Takes when she is sick) 180 mL 0   metFORMIN  (GLUCOPHAGE -XR) 500 MG 24 hr tablet Take 2 tablets (1,000 mg total) by mouth 2 (two) times daily. 360 tablet 3   metoprolol  tartrate (LOPRESSOR ) 100 MG tablet Take 1 tablet 2 hours prior to cardiac ct 1 tablet 0   olopatadine  (PATANOL) 0.1 % ophthalmic solution Place 1 drop into both eyes 2 (two) times daily. 5 mL 12   pantoprazole  (PROTONIX ) 40 MG tablet Take 1 tablet (40 mg total) by mouth daily. Patient needs follow up appointment for future refills. Please call [phone] to  schedule an appointment. 90 tablet 0   rivaroxaban  (XARELTO ) 20 MG TABS tablet Take 1 tablet (20 mg total) by mouth daily with supper. Needs to have follow-up appointment before next refill. 30 tablet 0   Semaglutide , 2 MG/DOSE, (OZEMPIC , 2 MG/DOSE,) 8 MG/3ML SOPN Inject 2 mg into the skin once a week. 3 mL 5   spironolactone  (ALDACTONE ) 25 MG tablet Take 1 tablet (25 mg total) by mouth daily. 90 tablet 3   traMADol  (ULTRAM ) 50 MG tablet Take 1 tablet (50 mg total) by mouth every 12 (twelve) hours as needed. Not to be refilled before 30 days 60 tablet 0  traZODone  (DESYREL ) 50 MG tablet Take 1 tablet (50 mg total) by mouth at bedtime as needed for sleep. 30 tablet 3   hydrocortisone  (ANUSOL -HC) 2.5 % rectal cream Place rectally as needed for hemorrhoids or anal itching. (Patient not taking: Reported on 10/30/2023)     mometasone -formoterol  (DULERA ) 100-5 MCG/ACT AERO Inhale 2 puffs into the lungs daily. (Patient not taking: Reported on 10/30/2023) 39 g 3   nystatin  (MYCOSTATIN /NYSTOP ) powder Apply 1 Application topically 2 (two) times daily. (Patient not taking: Reported on 10/30/2023) 60 g 5   No current facility-administered medications for this visit.     Review of Systems    She denies pnd, orthopnea, n, v, dizziness, syncope, edema, weight gain, or early satiety. All other systems reviewed and are otherwise negative except as noted above.   Physical Exam    VS:  BP 104/66   Pulse (!) 109   Ht 5' 4 (1.626 m)   Wt (!) 311 lb 12.8 oz (141.4 kg)   LMP 04/05/2016   SpO2 96%   BMI 53.52 kg/m   GEN: Well nourished, well developed, in no acute distress. HEENT: normal. Neck: Supple, no JVD, carotid bruits, or masses. Cardiac: RRR, no murmurs, rubs, or gallops. No clubbing, cyanosis, edema.  Radials/DP/PT 2+ and equal bilaterally.  Respiratory:  Respirations regular and unlabored, clear to auscultation bilaterally. GI: Soft, nontender, nondistended, BS + x 4. MS: no deformity or  atrophy. Skin: warm and dry, no rash. Neuro:  Strength and sensation are intact. Psych: Normal affect.  Accessory Clinical Findings    ECG personally reviewed by me today - EKG Interpretation Date/Time:  Wednesday October 30 2023 13:51:06 EDT Ventricular Rate:  109 PR Interval:  168 QRS Duration:  100 QT Interval:  352 QTC Calculation: 474 R Axis:   -33  Text Interpretation: Sinus tachycardia Left axis deviation Nonspecific ST and T wave abnormality When compared with ECG of 24-Apr-2023 12:30, Nonspecific T wave abnormality now evident in Anterior leads Confirmed by Daneen Perkins ([id number]) on 10/30/2023 1:55:42 PM  - no acute changes.   Lab Results  Component Value Date   WBC 9.1 09/25/2023   HGB 13.8 09/25/2023   HCT 44.7 09/25/2023   MCV 75.0 (L) 09/25/2023   PLT 307 09/25/2023   Lab Results  Component Value Date   CREATININE 0.61 09/25/2023   BUN 11 09/25/2023   NA 137 09/25/2023   K 4.3 09/25/2023   CL 97 (L) 09/25/2023   CO2 24 09/25/2023   Lab Results  Component Value Date   ALT 24 04/24/2023   AST 20 04/24/2023   ALKPHOS 77 04/24/2023   BILITOT 0.4 04/24/2023   Lab Results  Component Value Date   CHOL 192 06/21/2020   HDL 54 06/21/2020   LDLCALC 115 (H) 06/21/2020   LDLDIRECT 117 (H) 02/28/2021   TRIG 131 06/21/2020   CHOLHDL 3.6 06/21/2020    Lab Results  Component Value Date   HGBA1C 10.4 (A) 06/18/2023    Assessment & Plan    1. CAD/chest pain/dyspnea on exertion: Cardiopulmonary exercise test in 2019 showed no evidence of cardiac limitation.  Exercise intolerance was noted to be due to obesity.  She has evidence of coronary artery calcification noted on CT chest in 2024.  Cardiac PET stress test was previously ordered ordered but not completed (was denied by her insurance). Coronary CTA in 09/2023 revealed coronary calcium  score 131 (95th percentile), CT FFR analysis showed significant functional stenosis in the distal LAD. Follow-up cardiac  catheterization was recommended. She continues to note intermittent left-sided chest pain, dyspnea on exertion, elevated heart rate with minimal exertion, with associated tightness in her neck. Left heart cath scheduled for 11/05/2023 with Dr. Anner. Repeat fasting lipids pending.  We discussed procedure details, she is agreeable to proceed. She will hold her medications as below prior to cath:  11/02/23 hold Jardiance  11/03/23 hold Ozempic  11/03/23 hold Xarelto  8/19-8/21/25 hold Metformin  11/05/23 hold insulin  morning of procedure   Informed Consent   Shared Decision Making/Informed Consent The risks [stroke (1 in 1000), death (1 in 1000), kidney failure [usually temporary] (1 in 500), bleeding (1 in 200), allergic reaction [possibly serious] (1 in 200)], benefits (diagnostic support and management of coronary artery disease) and alternatives of a cardiac catheterization were discussed in detail with Ms. Mcinerny and she is willing to proceed.    2. Chronic diastolic heart failure: Echocardiogram in 03/2023 showed EF 55 to 60%, normal LV function, no RWMA, G1 DD, normal RV systolic function, no significant valvular abnormalities.  She does report dyspnea on exertion as above, pending LHC. Generally euvolemic and well compensated on exam.  Continue Jardiance , spironolactone .   3. Palpitations/PSVT: Cardiac monitor in 06/2023 showed predominantly sinus rhythm, first-degree AV block, 6 runs of SVT, PACs and PVCs, no significant arrhythmia.  She does report elevated heart rate with minimal activity. BP low in office today, continue to monitor BP/HR, consider initiation of beta-blocker therapy pending LHC.   4. History of DVT: She was diagnosed with lower extremity DVT in 07/2023.  Lower extremity venous duplex on 09/25/2023 showed no evidence of DVT.  She remains on Xarelto .   5. Hypertension: BP well controlled, borderline low. Continue current antihypertensive regimen.    6. Hyperlipidemia: LDL was 117  in 02/2021, above goal.  No recent LDL on file.  Will update fasting lipids, CMET today. Previously on atorvastatin .  Will address lipid-lowering therapy once lab results available.   7. Type 2 diabetes/obesity: A1c was 10.4 in 06/2023. Monitored and managed per PCP. Continue to encourage lifestyle modifications with diet and increased activity as tolerated.   8. OSA: Not on CPAP.    9. Disposition: Follow-up as scheduled with Dr. Anner in 11/2023.      Damien JAYSON Braver, NP 10/30/2023, 1:57 PM

## 2023-10-30 NOTE — Patient Instructions (Addendum)
 Medication Instructions:  Your physician recommends that you continue on your current medications as directed. Please refer to the Current Medication list given to you today.  *If you need a refill on your cardiac medications before your next appointment, please call your pharmacy*  Lab Work: NONE ordered at this time of appointment    Testing/Procedures: Proceed with Left Heart CATH  Follow-Up: At [facility], you and your health needs are our priority.  As part of our continuing mission to provide you with exceptional heart care, our providers are all part of one team.  This team includes your primary Cardiologist (physician) and Advanced Practice Providers or APPs (Physician Assistants and Nurse Practitioners) who all work together to provide you with the care you need, when you need it.  Your next appointment:    Keep follow up Dr. Anner   Provider:   Alm Anner, MD    We recommend signing up for the patient portal called MyChart.  Sign up information is provided on this After Visit Summary.  MyChart is used to connect with patients for Virtual Visits (Telemedicine).  Patients are able to view lab/test results, encounter notes, upcoming appointments, etc.  Non-urgent messages can be sent to your provider as well.   To learn more about what you can do with MyChart, go to [email or web address].   Other Instructions Meds prior to Left Heart CATH 11/02/23 hold Jardiance  11/03/23 hold Ozempic  11/03/23 hold Xarelto  8/19-8/21/25 hold Metformin  11/05/23 hold insulin  morning of procedure

## 2023-10-31 ENCOUNTER — Ambulatory Visit (HOSPITAL_BASED_OUTPATIENT_CLINIC_OR_DEPARTMENT_OTHER): Payer: Self-pay | Admitting: Family

## 2023-10-31 LAB — BASIC METABOLIC PANEL WITH GFR
BUN/Creatinine Ratio: 14 (ref 9–23)
BUN: 8 mg/dL (ref 6–24)
CO2: 22 mmol/L (ref 20–29)
Calcium: 9.5 mg/dL (ref 8.7–10.2)
Chloride: 99 mmol/L (ref 96–106)
Creatinine, Ser: 0.57 mg/dL (ref 0.57–1.00)
Glucose: 227 mg/dL — ABNORMAL HIGH (ref 70–99)
Potassium: 4.3 mmol/L (ref 3.5–5.2)
Sodium: 139 mmol/L (ref 134–144)
eGFR: 107 mL/min/1.73 (ref >59)

## 2023-10-31 LAB — CBC
Hematocrit: 44 % (ref 34.0–46.6)
Hemoglobin: 13.6 g/dL (ref 11.1–15.9)
MCH: 23.5 pg — ABNORMAL LOW (ref 26.6–33.0)
MCHC: 30.9 g/dL — ABNORMAL LOW (ref 31.5–35.7)
MCV: 76 fL — ABNORMAL LOW (ref 79–97)
Platelets: 300 x10E3/uL (ref 150–450)
RBC: 5.79 x10E6/uL — ABNORMAL HIGH (ref 3.77–5.28)
RDW: 15.8 % — ABNORMAL HIGH (ref 11.7–15.4)
WBC: 7.6 x10E3/uL (ref 3.4–10.8)

## 2023-11-03 NOTE — Progress Notes (Signed)
    SUBJECTIVE:   CHIEF COMPLAINT / HPI:   ***  DM is a 58yo F that pf f/u    - A1c - f/u Dr. Amalia  Heart cath 8/19  PERTINENT  PMH / PSH: ***  OBJECTIVE:   LMP 04/05/2016   ***  ASSESSMENT/PLAN:   Assessment & Plan      [name], MD [facility]

## 2023-11-04 ENCOUNTER — Telehealth: Payer: Self-pay | Admitting: *Deleted

## 2023-11-04 ENCOUNTER — Ambulatory Visit: Admitting: Family Medicine

## 2023-11-04 ENCOUNTER — Ambulatory Visit (INDEPENDENT_AMBULATORY_CARE_PROVIDER_SITE_OTHER): Admitting: Pharmacist

## 2023-11-04 ENCOUNTER — Encounter: Payer: Self-pay | Admitting: Pharmacist

## 2023-11-04 ENCOUNTER — Encounter: Payer: Self-pay | Admitting: Family Medicine

## 2023-11-04 VITALS — BP 128/88 | HR 97 | Ht 64.0 in | Wt 311.6 lb

## 2023-11-04 DIAGNOSIS — K2950 Unspecified chronic gastritis without bleeding: Secondary | ICD-10-CM

## 2023-11-04 DIAGNOSIS — E118 Type 2 diabetes mellitus with unspecified complications: Secondary | ICD-10-CM | POA: Diagnosis not present

## 2023-11-04 DIAGNOSIS — I2699 Other pulmonary embolism without acute cor pulmonale: Secondary | ICD-10-CM

## 2023-11-04 DIAGNOSIS — M170 Bilateral primary osteoarthritis of knee: Secondary | ICD-10-CM

## 2023-11-04 DIAGNOSIS — B9681 Helicobacter pylori [H. pylori] as the cause of diseases classified elsewhere: Secondary | ICD-10-CM

## 2023-11-04 DIAGNOSIS — I2510 Atherosclerotic heart disease of native coronary artery without angina pectoris: Secondary | ICD-10-CM | POA: Diagnosis not present

## 2023-11-04 DIAGNOSIS — E1169 Type 2 diabetes mellitus with other specified complication: Secondary | ICD-10-CM

## 2023-11-04 DIAGNOSIS — E785 Hyperlipidemia, unspecified: Secondary | ICD-10-CM

## 2023-11-04 DIAGNOSIS — I10 Essential (primary) hypertension: Secondary | ICD-10-CM

## 2023-11-04 LAB — POCT GLYCOSYLATED HEMOGLOBIN (HGB A1C): HbA1c, POC (controlled diabetic range): 10.5 % — AB (ref 0.0–7.0)

## 2023-11-04 MED ORDER — CYCLOBENZAPRINE HCL 10 MG PO TABS: 10.0000 mg | ORAL_TABLET | Freq: Two times a day (BID) | ORAL | 0 refills | Status: DC | PRN

## 2023-11-04 MED ORDER — INSULIN GLARGINE 100 UNIT/ML SOLOSTAR PEN: 48.0000 [IU] | PEN_INJECTOR | SUBCUTANEOUS | Status: DC

## 2023-11-04 NOTE — Telephone Encounter (Signed)
Call placed to patient to review instructions, no answer.

## 2023-11-04 NOTE — Assessment & Plan Note (Signed)
 uncontrolled, last LDL 115 mg/dL on 95/94/7977 not at goal of <70 mg/dL. ASCVD risk factors include Hyperlipidemia, CAD, hypertension, and T2DM. High intensity statin indicated. Not on any cholesterol medications. -Follow up post-op plans. -Order lipid panel for next visit.

## 2023-11-04 NOTE — Assessment & Plan Note (Signed)
 Controlled on spironolactone , BP today 128/88 mmHg. Blood pressure goal of <130/80 mmHg. Medication adherence appears good.  -Continue spironolactone  25 mg daily.

## 2023-11-04 NOTE — Telephone Encounter (Signed)
 Cardiac Catheterization scheduled at [facility] for: Tuesday November 05, 2023 1:30 PM Arrival time [facility] Main Entrance A at: 11:30 AM  Diet: -May have light meal until 7:30 AM. (6 hours before procedure time) Approved light meal consists of plain toast, fruit, light soups, crackers.  Hydration: -May drink clear liquids until leaving for hospital. Approved liquids: Water , clear tea, black coffee, fruit juices-non-citric and without pulp,Gatorade, plain Jello/popsicles.  Drink 16 oz. bottle of water  on the way to the hospital.   Medication instructions: -Hold:  11/02/23 hold Jardiance  11/03/23 hold Ozempic  11/03/23 hold Xarelto  8/19-8/21/25 hold Metformin  11/05/23 hold insulin  morning of procedure. Spironolactone -AM of procedure -Other usual morning medications can be taken including aspirin  81 mg.  Plan to go home the same day, you will only stay overnight if medically necessary.  You must have responsible adult to drive you home.  Someone must be with you the first 24 hours after you arrive home.  Call placed to patient to review instructions, no answer.

## 2023-11-04 NOTE — Patient Instructions (Addendum)
 It was nice to see you today! Hoping your procedure goes well tomorrow, we'll follow-up in a few weeks to manage post-op.  Your goal blood sugar is 80-130 before eating and less than 180 after eating.  Medication Changes:  Take Lantus  (insulin  glargine) at 48 units today only then we'll discuss changes after procedure. Continue all other medication the same.    Keep up the good work with diet and exercise. Aim for a diet full of vegetables, fruit and lean meats (chicken, [place], fish). Try to limit salt intake by eating fresh or frozen vegetables (instead of canned), rinse canned vegetables prior to cooking and do not add any additional salt to meals.

## 2023-11-04 NOTE — Patient Instructions (Signed)
 Come back to see me in 1-2 weeks after your heart cath. If things are going well, we can restart PT for you.

## 2023-11-04 NOTE — Progress Notes (Signed)
 S:     Chief Complaint  Patient presents with   Medication Management    Dm f/u   58 y.o. female who presents for diabetes evaluation, education, and management. Patient arrives in nervous but good spirits and presents without any assistance in preparation for left heart cath tomorrow. Patient is accompanied by daughter Samule and Husband Susen.   Patient was referred and last seen by Primary Care Provider, Dr. Elicia, today 11/04/23.  At last visit with me on 10/03/23, Lantus  (insulin  glargine) was increased from 30 units to 40 units.   PMH is significant for T2DM, CAD, hypertension, Hyperlipidemia, hx of DVT, diastolic heart failure, OA, chronic H.pylori. Of note, patient has heart cath scheduled for tomorrow morning. Patient reports Diabetes was diagnosed in 2019.     Current diabetes medications include: Ozempic  (semaglutide ) 2 mg weekly on Sundays, metformin  XR 1,000 mg BID, Jardiance  (empagliflozin ) 25 mg daily, Lantus  (insulin  glargine) 40 units daily.  Current hypertension medications include: spironolactone  25 mg.  Current hyperlipidemia medications include: none currently.  Patient reports adherence to taking all medications as prescribed. Reports holding medications this morning for procedure tomorrow. Today, glucose 285 on CGM. Reports home glucose 150-160s then jumps to 260-280s with any physical activity, eating/drinking, or infection. Reports HIGH stress regarding procedure tomorrow.   Patient has plan for peri-procedure medication use from Damien Braver, NP - visit 8/13 11/02/23 hold Jardiance  11/03/23 hold Ozempic  11/03/23 hold Xarelto  8/19-8/21/25 hold Metformin  11/05/23 hold insulin  morning of procedure (Patient had NOT taken insulin  yet today 8/18)  Patient denies hypoglycemic events.   O:   Review of Systems  Musculoskeletal:  Positive for joint pain (knees).  Neurological:  Positive for headaches.  Psychiatric/Behavioral:  The patient is nervous/anxious.    All other systems reviewed and are negative.   Physical Exam Constitutional:      Appearance: Normal appearance.  Pulmonary:     Effort: Pulmonary effort is normal.  Neurological:     Mental Status: She is alert.  Psychiatric:        Mood and Affect: Mood normal.        Behavior: Behavior normal.        Thought Content: Thought content normal.        Judgment: Judgment normal.     Lab Results  Component Value Date   HGBA1C 10.5 (A) 11/04/2023    Lipid Panel     Component Value Date/Time   CHOL 192 06/21/2020 1547   TRIG 131 06/21/2020 1547   HDL 54 06/21/2020 1547   CHOLHDL 3.6 06/21/2020 1547   LDLCALC 115 (H) 06/21/2020 1547   LDLDIRECT 117 (H) 02/28/2021 0917    Patient is participating in a Managed Medicaid Plan:  Yes   A/P: Diabetes longstanding 2019 currently uncontrolled with A1c 10.5% today and glucose 285 on CGM. On Ozempic  (semaglutide ), Lantus  (insulin  glargine) 40 units, Jardiance  (empagliflozin ), and metformin . Patient is able to verbalize appropriate hypoglycemia management plan. Denies any low reading with recent Lantus  (insulin  glargine) increase in dose.  Medication adherence appears good. Control is suboptimal due to high stress, diet, and physical inactivity. Glucose value by CGM during visit ~ 280 -Hold medications as directed for procedure and follow post-op medication changes TBD.  -Increase Lantus  (insulin  glargine) from 40 units to 48 units today only to bring glucose down prior to procedure.  Hyperlipidemia: uncontrolled, last LDL 115 mg/dL on 95/94/7977 not at goal of <70 mg/dL. ASCVD risk factors include Hyperlipidemia, CAD, hypertension, and T2DM.  High intensity statin indicated. Not on any cholesterol medications. -Follow up post-op plans. -Order lipid panel for next visit.  Hypertension: controlled on spironolactone , BP today 128/88 mmHg. Blood pressure goal of <130/80 mmHg. Medication adherence appears good.  -Continue spironolactone  25  mg daily.  Written patient instructions provided. Patient verbalized understanding of treatment plan.  Total time in face to face counseling 23 minutes.    Follow-up:  Pharmacist TBD PCP clinic visit TBD Patient seen with Fonda Blase, PharmD Candidate - [employer] student and Calton Nash, PharmD Candidate - [employer] student.

## 2023-11-04 NOTE — Assessment & Plan Note (Signed)
 Diabetes longstanding 2019 currently uncontrolled with A1c 10.5% today and glucose 285 on CGM. On Ozempic  (semaglutide ), Lantus  (insulin  glargine) 40 units, Jardiance  (empagliflozin ), and metformin . Patient is able to verbalize appropriate hypoglycemia management plan. Denies any low reading with recent Lantus  (insulin  glargine) increase in dose.  Medication adherence appears good. Control is suboptimal due to high stress, diet, and physical inactivity. Glucose value by CGM during visit ~ 280 -Hold medications as directed for procedure and follow post-op medication changes TBD.  -Increase Lantus  (insulin  glargine) from 40 units to 48 units today only to bring glucose down prior to procedure.

## 2023-11-04 NOTE — Progress Notes (Signed)
 Reviewed and agree with Dr Rennis plan.

## 2023-11-05 ENCOUNTER — Ambulatory Visit (HOSPITAL_COMMUNITY)
Admission: RE | Admit: 2023-11-05 | Discharge: 2023-11-05 | Disposition: A | Discharge status: Home / Self Care | Attending: Cardiology | Admitting: Cardiology

## 2023-11-05 ENCOUNTER — Other Ambulatory Visit: Payer: Self-pay

## 2023-11-05 ENCOUNTER — Encounter (HOSPITAL_COMMUNITY)
Admission: RE | Disposition: A | Discharge status: Home / Self Care | Payer: Self-pay | Source: Home / Self Care | Attending: Cardiology

## 2023-11-05 DIAGNOSIS — Z794 Long term (current) use of insulin: Secondary | ICD-10-CM | POA: Insufficient documentation

## 2023-11-05 DIAGNOSIS — G4733 Obstructive sleep apnea (adult) (pediatric): Secondary | ICD-10-CM | POA: Diagnosis not present

## 2023-11-05 DIAGNOSIS — E119 Type 2 diabetes mellitus without complications: Secondary | ICD-10-CM | POA: Diagnosis not present

## 2023-11-05 DIAGNOSIS — I5032 Chronic diastolic (congestive) heart failure: Secondary | ICD-10-CM | POA: Diagnosis not present

## 2023-11-05 DIAGNOSIS — Z86718 Personal history of other venous thrombosis and embolism: Secondary | ICD-10-CM | POA: Insufficient documentation

## 2023-11-05 DIAGNOSIS — I471 Supraventricular tachycardia, unspecified: Secondary | ICD-10-CM | POA: Diagnosis not present

## 2023-11-05 DIAGNOSIS — I110 Hypertensive heart disease with heart failure: Secondary | ICD-10-CM | POA: Diagnosis not present

## 2023-11-05 DIAGNOSIS — I25118 Atherosclerotic heart disease of native coronary artery with other forms of angina pectoris: Secondary | ICD-10-CM | POA: Diagnosis present

## 2023-11-05 DIAGNOSIS — I2510 Atherosclerotic heart disease of native coronary artery without angina pectoris: Secondary | ICD-10-CM

## 2023-11-05 DIAGNOSIS — Z7984 Long term (current) use of oral hypoglycemic drugs: Secondary | ICD-10-CM | POA: Diagnosis not present

## 2023-11-05 DIAGNOSIS — Z7985 Long-term (current) use of injectable non-insulin antidiabetic drugs: Secondary | ICD-10-CM | POA: Insufficient documentation

## 2023-11-05 DIAGNOSIS — Z6841 Body Mass Index (BMI) 40.0 and over, adult: Secondary | ICD-10-CM | POA: Insufficient documentation

## 2023-11-05 DIAGNOSIS — Z79899 Other long term (current) drug therapy: Secondary | ICD-10-CM | POA: Diagnosis not present

## 2023-11-05 DIAGNOSIS — E785 Hyperlipidemia, unspecified: Secondary | ICD-10-CM | POA: Insufficient documentation

## 2023-11-05 DIAGNOSIS — Z7901 Long term (current) use of anticoagulants: Secondary | ICD-10-CM | POA: Insufficient documentation

## 2023-11-05 HISTORY — PX: LEFT HEART CATH AND CORONARY ANGIOGRAPHY: CATH118249

## 2023-11-05 LAB — GLUCOSE, CAPILLARY
Glucose-Capillary: 242 mg/dL — ABNORMAL HIGH (ref 70–99)
Glucose-Capillary: 211 mg/dL — ABNORMAL HIGH (ref 70–99)

## 2023-11-05 SURGERY — LEFT HEART CATH AND CORONARY ANGIOGRAPHY
Anesthesia: LOCAL

## 2023-11-05 MED ORDER — SODIUM CHLORIDE 0.9% FLUSH: 3.0000 mL | INTRAVENOUS | Status: DC | PRN

## 2023-11-05 MED ORDER — FREE WATER: 500.0000 mL | Freq: Once | Status: DC

## 2023-11-05 MED ORDER — ASPIRIN 81 MG PO CHEW: 81.0000 mg | CHEWABLE_TABLET | ORAL | Status: DC

## 2023-11-05 MED ORDER — LIDOCAINE HCL (PF) 1 % IJ SOLN
INTRAMUSCULAR | Status: DC | PRN
  Administered 2023-11-05: 2 mL

## 2023-11-05 MED ORDER — HEPARIN SODIUM (PORCINE) 1000 UNIT/ML IJ SOLN
INTRAMUSCULAR | Status: DC | PRN
  Administered 2023-11-05: 6000 [IU] via INTRAVENOUS

## 2023-11-05 MED ORDER — SODIUM CHLORIDE 0.9% FLUSH: 3.0000 mL | Freq: Two times a day (BID) | INTRAVENOUS | Status: DC

## 2023-11-05 MED ORDER — IOHEXOL 350 MG/ML SOLN
INTRAVENOUS | Status: DC | PRN
  Administered 2023-11-05: 120 mL via INTRA_ARTERIAL

## 2023-11-05 MED ORDER — HYDRALAZINE HCL 20 MG/ML IJ SOLN: 10.0000 mg | INTRAMUSCULAR | Status: DC | PRN

## 2023-11-05 MED ORDER — HEPARIN (PORCINE) IN NACL 1000-0.9 UT/500ML-% IV SOLN
INTRAVENOUS | Status: DC | PRN
  Administered 2023-11-05 (×2): 500 mL via INTRA_ARTERIAL

## 2023-11-05 MED ORDER — LABETALOL HCL 5 MG/ML IV SOLN: 10.0000 mg | INTRAVENOUS | Status: DC | PRN

## 2023-11-05 MED ORDER — FENTANYL CITRATE (PF) 100 MCG/2ML IJ SOLN
INTRAMUSCULAR | Status: AC
Start: 2023-11-05 — End: 2023-11-05
  Filled 2023-11-05: qty 2

## 2023-11-05 MED ORDER — MIDAZOLAM HCL 2 MG/2ML IJ SOLN
INTRAMUSCULAR | Status: DC | PRN
  Administered 2023-11-05 (×3): 1 mg via INTRAVENOUS

## 2023-11-05 MED ORDER — MIDAZOLAM HCL 2 MG/2ML IJ SOLN
INTRAMUSCULAR | Status: AC
  Filled 2023-11-05: qty 2

## 2023-11-05 MED ORDER — ONDANSETRON HCL 4 MG/2ML IJ SOLN: 4.0000 mg | Freq: Four times a day (QID) | INTRAMUSCULAR | Status: DC | PRN

## 2023-11-05 MED ORDER — ACETAMINOPHEN 325 MG PO TABS
650.0000 mg | ORAL_TABLET | ORAL | Status: DC | PRN
  Administered 2023-11-05: 650 mg via ORAL
  Filled 2023-11-05: qty 2

## 2023-11-05 MED ORDER — MORPHINE SULFATE (PF) 2 MG/ML IV SOLN: 2.0000 mg | INTRAVENOUS | Status: DC | PRN

## 2023-11-05 MED ORDER — SODIUM CHLORIDE 0.9 % IV SOLN: 250.0000 mL | INTRAVENOUS | Status: DC | PRN

## 2023-11-05 MED ORDER — VERAPAMIL HCL 2.5 MG/ML IV SOLN
INTRAVENOUS | Status: DC | PRN
  Administered 2023-11-05 (×2): 10 mL via INTRA_ARTERIAL

## 2023-11-05 MED ORDER — LIDOCAINE HCL (PF) 1 % IJ SOLN
INTRAMUSCULAR | Status: AC
  Filled 2023-11-05: qty 30

## 2023-11-05 MED ORDER — VERAPAMIL HCL 2.5 MG/ML IV SOLN
INTRAVENOUS | Status: AC
Start: 2023-11-05 — End: 2023-11-05
  Filled 2023-11-05: qty 2

## 2023-11-05 MED ORDER — FENTANYL CITRATE (PF) 100 MCG/2ML IJ SOLN
INTRAMUSCULAR | Status: DC | PRN
  Administered 2023-11-05 (×4): 25 ug via INTRAVENOUS

## 2023-11-05 MED ORDER — HEPARIN SODIUM (PORCINE) 1000 UNIT/ML IJ SOLN
INTRAMUSCULAR | Status: AC
  Filled 2023-11-05: qty 10

## 2023-11-05 SURGICAL SUPPLY — 10 items
CATH INFINITI AMBI 5FR TG (CATHETERS) IMPLANT
CATH INFINITI JR4 5F (CATHETERS) IMPLANT
CATH LAUNCHER 5F JR4 (CATHETERS) IMPLANT
DEVICE RAD COMP TR BAND LRG (VASCULAR PRODUCTS) IMPLANT
GLIDESHEATH SLEND SS 6F .021 (SHEATH) IMPLANT
GUIDEWIRE INQWIRE 1.5J.035X260 (WIRE) IMPLANT
KIT SINGLE USE MANIFOLD (KITS) IMPLANT
PACK CARDIAC CATHETERIZATION (CUSTOM PROCEDURE TRAY) ×1 IMPLANT
SET ATX-X65L (MISCELLANEOUS) IMPLANT
SHEATH PROBE COVER 6X72 (BAG) IMPLANT

## 2023-11-05 NOTE — Discharge Instructions (Addendum)

## 2023-11-05 NOTE — Interval H&P Note (Signed)
 History and Physical Interval Note:  11/05/2023 3:03 PM  Rita Bass  has presented today for surgery, with the diagnosis of cad - Abnormal Cardiac CT Angiogram.    The various methods of treatment have been discussed with the patient and family. After consideration of risks, benefits and other options for treatment, the patient has consented to  Procedure(s): LEFT HEART CATH AND CORONARY ANGIOGRAPHY (N/A)  PERCUTANEOUS CORONARY INTERVENTION  as a surgical intervention.  The patient's history has been reviewed, patient examined, no change in status, stable for surgery.  I have reviewed the patient's chart and labs.  Questions were answered to the patient's satisfaction.    Cath Lab Visit (complete for each Cath Lab visit)  Clinical Evaluation Leading to the Procedure:   ACS: No.  Non-ACS:    Anginal Classification: CCS II  Anti-ischemic medical therapy: Minimal Therapy (1 class of medications)  Non-Invasive Test Results: Intermediate-risk stress test findings: cardiac mortality 1-3%/year  Prior CABG: No previous CABG     Alm Clay

## 2023-11-06 ENCOUNTER — Other Ambulatory Visit: Payer: Self-pay

## 2023-11-06 ENCOUNTER — Encounter (HOSPITAL_COMMUNITY): Payer: Self-pay | Admitting: Cardiology

## 2023-11-06 ENCOUNTER — Other Ambulatory Visit

## 2023-11-06 ENCOUNTER — Telehealth: Payer: Self-pay | Admitting: *Deleted

## 2023-11-06 DIAGNOSIS — E1140 Type 2 diabetes mellitus with diabetic neuropathy, unspecified: Secondary | ICD-10-CM

## 2023-11-06 DIAGNOSIS — I2699 Other pulmonary embolism without acute cor pulmonale: Secondary | ICD-10-CM | POA: Insufficient documentation

## 2023-11-06 DIAGNOSIS — E118 Type 2 diabetes mellitus with unspecified complications: Secondary | ICD-10-CM

## 2023-11-06 DIAGNOSIS — I10 Essential (primary) hypertension: Secondary | ICD-10-CM

## 2023-11-06 NOTE — Patient Outreach (Signed)
 Complex Care Management   Visit Note  11/06/2023  Name:  Rita Bass MRN: [id number] DOB: 1965/11/20  Situation: Referral received for Complex Care Management related to Diabetes with Complications and HTN s/p cardiac cath 11/05/23 I obtained verbal consent from Patient.  Visit completed with Patient  on the phone  Background:   Past Medical History:  Diagnosis Date   Arthritis    Asthma    Chronic pain of left knee    Complication of anesthesia    PONV   Diabetes (HCC)    GERD (gastroesophageal reflux disease)    H/O bronchitis    Hepatic steatosis    Internal hemorrhoids    Kidney stones    Obesity    Renal disorder    Shortness of breath dyspnea    Sleep apnea    past issues - at weight over 400lbs   Sleep apnea in adult    Polysomnogram pending.  Followed by Dr. Shellia    Assessment: Patient Reported Symptoms:  Cognitive Cognitive Status: Able to follow simple commands, Alert and oriented to person, place, and time, Normal speech and language skills Cognitive/Intellectual Conditions Management [RPT]: None reported or documented in medical history or problem list   Health Maintenance Behaviors: Annual physical exam  Neurological Neurological Review of Symptoms: Vision changes Neurological Management Strategies: Routine screening  HEENT HEENT Symptoms Reported: Other: HEENT Comment: Note referral was sent to [facility] in April. Patient reports she has not heard from them yet. will discuss more at f/u visits.    Cardiovascular Cardiovascular Symptoms Reported: Swelling in legs or feet, Other:, Chest pain or discomfort, Palpitations (BLE swelling all the time) Other Cardiovascular Symptoms: Patient reports chest pain/discomfort and palpitations with minimal exertion, prompting cardiac cath intervention. Patient had cardiac cath 11/05/23. She is now home resting and following discharge instructions, including keeping her  arm resting on her chest for a few days. She has resumed her Xarelto  as instructed. She has f/u with cardiologist 12/02/23. Note patient is following with hematology regarding blood clot in L leg. She has f/u with them 11/19/23. Does patient have uncontrolled Hypertension?: Yes Is patient checking Blood Pressure at home?: No Cardiovascular Management Strategies: Medication therapy, Routine screening, Adequate rest, Coping strategies, Medical device (Compression socks) Cardiovascular Comment: Patient notes that she does not have a scale or BP cuff. Message sent to PCP requesting DME orders.  Respiratory Respiratory Symptoms Reported: Shortness of breath Other Respiratory Symptoms: Shortness of breath with exertion, relieved with rest Additional Respiratory Details: Note that patient has a history of sleep apnea. She notes she does not wear CPAP due to losing weight and having an easier time breathing. Respiratory Management Strategies: Coping strategies, Medication therapy  Endocrine Endocrine Symptoms Reported: Increased urination Is patient diabetic?: Yes Is patient checking blood sugars at home?: Yes List most recent blood sugar readings, include date and time of day: FreeStyle Libre 3, 225 this morning Endocrine Comment: Patient is following with pharmacist, Dr. Koval, for DM management. She has been instructed to call him after cardiac cath to schedule a f/u visit. Per d/c instructions she is to hold diabetic medications for 48 hours after procedure.  Gastrointestinal Gastrointestinal Symptoms Reported: No symptoms reported Additional Gastrointestinal Details: Patient reports a good appetite. Last BM yesterday. Patient reports she is working on treatment for H. Pylori, focusing on managing cardiac symptoms first. Gastrointestinal Management Strategies: Diet modification    Genitourinary Genitourinary Symptoms Reported: Frequency Additional Genitourinary Details:  Urine is clear and yellow per  patient Genitourinary Management Strategies: Coping strategies  Integumentary Integumentary Symptoms Reported: Other Other Integumentary Symptoms: Insertion point R wrist from cardiac cath. Patient reports insertion site is currently wrapped and covered. Reviewed signs and symptoms of infection to monitor for. Skin Management Strategies: Dressing changes, Routine screening  Musculoskeletal Musculoskelatal Symptoms Reviewed: Other, Difficulty walking, Joint pain (Bilateral knee pain R>L due to arthritis) Other Musculoskeletal Symptoms: Patient reports strength is ok today. She has been able to get up and move around. Patient reports cardiac cath insertion was on R wrist and that is her dominate hand. She reports providers taped her hand to her chest so she cannot use it for 48 hours. Her daughter has been helping to manage. Patient reports blood clot in L leg makes it hard for her to get around. She notes blood clot was found about 7 weeks ago and she was started on anticoagulation. Additional Musculoskeletal Details: Patient notes that she was getting injections in her knees for pain. Injections were stopped when blood clot was found. Patient is hoping to resume injections after blood clot has resolved. Musculoskeletal Management Strategies: Medical device, Adequate rest, Coping strategies, Routine screening (2 wheel walker) Falls in the past year?: No Number of falls in past year: 1 or less Was there an injury with Fall?: No Fall Risk Category Calculator: 0 Patient Fall Risk Level: Low Fall Risk Patient at Risk for Falls Due to: Impaired balance/gait Fall risk Follow up: Falls evaluation completed, Education provided, Falls prevention discussed  Psychosocial Psychosocial Symptoms Reported: Anxiety - if selected complete GAD Additional Psychological Details: Patient reports increased anxiety over the past couple of weeks related to her health and upcomg cardiac cath. Behavioral Management  Strategies: Medication therapy, Support system Major Change/Loss/Stressor/Fears (CP): Medical condition, self Techniques to Cope with Loss/Stress/Change: Diversional activities (Spending time with family (grandkids)) Quality of Family Relationships: helpful, involved, supportive Do you feel physically threatened by others?: No    11/06/2023    PHQ2-9 Depression Screening   Little interest or pleasure in doing things Not at all  Feeling down, depressed, or hopeless Not at all  PHQ-2 - Total Score 0  Trouble falling or staying asleep, or sleeping too much    Feeling tired or having little energy    Poor appetite or overeating     Feeling bad about yourself - or that you are a failure or have let yourself or your family down    Trouble concentrating on things, such as reading the newspaper or watching television    Moving or speaking so slowly that other people could have noticed.  Or the opposite - being so fidgety or restless that you have been moving around a lot more than usual    Thoughts that you would be better off dead, or hurting yourself in some way    PHQ2-9 Total Score    If you checked off any problems, how difficult have these problems made it for you to do your work, take care of things at home, or get along with other people    Depression Interventions/Treatment      There were no vitals filed for this visit.  Medications Reviewed Today     Reviewed by Arno Rosaline SQUIBB, RN (Registered Nurse) on 11/06/23 at 1344  Med List Status: <None>   Medication Order Taking? Sig Documenting Provider Last Dose Status Informant  Accu-Chek Softclix Lancets lancets [id number] Yes USE IN THE MORNING, AT NOON AND AT BEDTIME  [name], MD  Active Self  acetaminophen  (TYLENOL ) 650 MG CR tablet [id number] Yes Take 650-1,300 mg by mouth every 8 (eight) hours as needed for pain. Last dose (2) around 0930 am. [provider]  Active Self           Med Note CHRISTIE ALEXANDER   Fri  Nov 01, 2023 11:52 AM)    albuterol  (PROVENTIL ) (5 MG/ML) 0.5% nebulizer solution [id number] Yes Take 0.5 mLs (2.5 mg total) by nebulization every 6 (six) hours as needed for wheezing or shortness of breath. [name], MD  Active Self  Blood Glucose Monitoring Suppl DEVI [id number] Yes 1 each by Does not apply route in the morning, at noon, and at bedtime. May substitute to any manufacturer covered by patient's insurance. [name], MD  Active Self           Med Note MAUDIE, MAUDE MATSU   Tue Aug 13, 2023  3:12 PM) Hasn't needed since January  Continuous Glucose Receiver (FREESTYLE LIBRE 3 READER) DEVI [id number] Yes 1 Device as directed. [provider]  Active Self  Continuous Glucose Sensor (FREESTYLE LIBRE 3 PLUS SENSOR) MISC [id number] Yes Change sensor every 15 days. McDiarmid, Krystal BIRCH, MD  Active Self  cyclobenzaprine  (FLEXERIL ) 10 MG tablet [id number] Yes Take 1 tablet (10 mg total) by mouth 2 (two) times daily as needed for muscle spasms. McDiarmid, Krystal BIRCH, MD  Active   diclofenac  Sodium (VOLTAREN ) 1 % GEL [id number] Yes Apply 2 g topically 4 (four) times daily.  Patient taking differently: Apply 2 g topically in the morning and at bedtime.   Bauer, Collin S, PA-C  Active Self  DULoxetine  (CYMBALTA ) 60 MG capsule [id number] Yes Take 1 capsule (60 mg total) by mouth daily. [name], MD  Active Self  empagliflozin  (JARDIANCE ) 25 MG TABS tablet [id number] Yes Take 1 tablet (25 mg total) by mouth daily. McDiarmid, Krystal BIRCH, MD  Active Self  gabapentin  (NEURONTIN ) 100 MG capsule [id number] Yes Take 6 capsules (600 mg total) by mouth 3 (three) times daily. Rosendo Rush, MD  Active Self  glucose blood (ACCU-CHEK GUIDE) test strip [id number] Yes 1 each by Other route daily. for testing McDiarmid, Krystal BIRCH, MD  Active Self  insulin  glargine (LANTUS ) 100 UNIT/ML Solostar Pen [id number] Yes Inject 48 Units into the skin every morning. Inject 30 units subcutaneously every morning. Increase by 2 units  each day until sugar < 150. Maximum daily dose 50 units. McDiarmid, Krystal BIRCH, MD  Active   Insulin  Pen Needle (PEN NEEDLES) 32G X 5 MM MISC [id number] Yes Use as directed McDiarmid, Krystal BIRCH, MD  Active Self  metFORMIN  (GLUCOPHAGE -XR) 500 MG 24 hr tablet [id number]  Take 2 tablets (1,000 mg total) by mouth 2 (two) times daily. McDiarmid, Krystal BIRCH, MD  Active Self  mometasone -formoterol  (DULERA ) 100-5 MCG/ACT AERO [id number] Yes Inhale 2 puffs into the lungs daily.  Patient taking differently: Inhale 2 puffs into the lungs as needed. Patient reports using when she has respiratory symptoms, typically in the Winter   Zheng, Jacky, MD  Active Self           Med Note MAUDIE, MAUDE MATSU   Tue Aug 13, 2023  3:15 PM) Uses PRN for illnesses - often in winter  nystatin  (MYCOSTATIN /NYSTOP ) powder [id number] Yes Apply 1 Application topically 2 (two) times daily.  Patient taking differently: Apply 1 Application topically 2 (two) times daily as needed (heat rash).   [name], MD  Active Self  olopatadine  (  PATANOL) 0.1 % ophthalmic solution [id number] Yes Place 1 drop into both eyes 2 (two) times daily.  Patient taking differently: Place 1 drop into both eyes 2 (two) times daily as needed for allergies.   Espinoza, Alejandra, DO  Active Self  pantoprazole  (PROTONIX ) 40 MG tablet [id number] Yes Take 1 tablet (40 mg total) by mouth daily. Patient needs follow up appointment for future refills. Please call [phone] to schedule an appointment.  Patient taking differently: Take 40 mg by mouth daily as needed (reflux). Patient needs follow up appointment for future refills. Please call [phone] to schedule an appointment.   Cirigliano, Vito V, DO  Active Self  rivaroxaban  (XARELTO ) 20 MG TABS tablet [id number] Yes Take 1 tablet (20 mg total) by mouth daily with supper. Needs to have follow-up appointment before next refill. [name], MD  Active Self  Semaglutide , 2 MG/DOSE, (OZEMPIC , 2 MG/DOSE,) 8 MG/3ML SOPN [id number]  Yes Inject 2 mg into the skin once a week. McDiarmid, Krystal BIRCH, MD  Active Self           Med Note (Jaylend Reiland P   Wed Nov 06, 2023  1:44 PM) Sunday  spironolactone  (ALDACTONE ) 25 MG tablet [id number] Yes Take 1 tablet (25 mg total) by mouth daily. Santo Stanly LABOR, MD  Active Self  traMADol  (ULTRAM ) 50 MG tablet [id number] Yes Take 1 tablet (50 mg total) by mouth every 12 (twelve) hours as needed. Not to be refilled before 30 days [name], MD  Active Self  traZODone  (DESYREL ) 50 MG tablet [id number] Yes Take 1 tablet (50 mg total) by mouth at bedtime as needed for sleep. [name], MD  Active Self            Recommendation:   PCP Follow-up Specialty provider follow-up cardiology Continue Current Plan of Care Patient will call office to schedule a follow-up visit with PCP and Dr. Amalia  Follow Up Plan:   Telephone follow up appointment date/time:  11/12/23 at 1 PM  Rosaline Finlay, RN MSN [place]  [facility] RN Care Manager Direct Dial: [phone]  Fax: [phone]

## 2023-11-06 NOTE — Patient Instructions (Signed)
 Visit Information  Rita Bass was given information about Medicaid Managed Care team care coordination services as a part of their Healthy [facility] Medicaid benefit. Rita Bass   If you would like to schedule transportation through your Healthy [facility] plan, please call the following number at least 2 days in advance of your appointment: [phone]  For information about your ride after you set it up, call Ride Assist at [phone]. Use this number to activate a Will Call pickup, or if your transportation is late for a scheduled pickup. Use this number, too, if you need to make a change or cancel a previously scheduled reservation.  If you need transportation services right away, call [phone]. The after-hours call center is staffed 24 hours to handle ride assistance and urgent reservation requests (including discharges) 365 days a year. Urgent trips include sick visits, hospital discharge requests and life-sustaining treatment.  Call the [facility] Line at [phone], at any time, 24 hours a day, 7 days a week. If you are in danger or need immediate medical attention call 911.   Rita Bass - following are the goals we discussed in your visit today:   Goals Addressed             This Visit's Progress    VBCI RN Care Plan   On track    Problems:  Chronic Disease Management support and education needs related to CAD, DMII, and HTN  Goal: Over the next 30 days the Patient will attend all scheduled medical appointments: cardiology, PCP, pharmacist as evidenced by completed visit notes uploaded to EMR        demonstrate Improved adherence to prescribed treatment plan for HTN as evidenced by obtaining BP cuff and monitoring at home demonstrate Ongoing health management independence as evidenced by following discharge instructions after cardiac cath 11/05/23        take all medications exactly as prescribed and will call provider for medication related  questions as evidenced by patient report of medication compliance    verbalize basic understanding of DMII disease process and self health management plan as evidenced by patient report of lifestyle modifications to promote improved blood sugar control towards goal of A1c <7% work with pharmacist to address medication management related to DMII as evidenced by review of electronic medical record and patient or pharmacist report     Interventions:   CAD Interventions: Assessed understanding of CAD diagnosis Medications reviewed including medications utilized in CAD treatment plan Provided education on importance of blood pressure control in management of CAD Provided education on Importance of limiting foods high in cholesterol Counseled on importance of regular laboratory monitoring as prescribed Reviewed Importance of taking all medications as prescribed Reviewed Importance of attending all scheduled provider appointments Screening for signs and symptoms of depression related to chronic disease state Assessed social determinant of health barriers   Diabetes Interventions: Assessed patient's understanding of A1c goal: <7% Reviewed medications with patient and discussed importance of medication adherence Counseled on importance of regular laboratory monitoring as prescribed Discussed plans with patient for ongoing care management follow up and provided patient with direct contact information for care management team Reviewed scheduled/upcoming provider appointments including: schedule a follow-up with Dr. Koval and Dr. Elicia Certain patient, providing education and rationale, to check cbg as advised by provider and record, calling PCP or Dr. Koval for findings outside established parameters Reviewed how stress can effect blood sugar readings Reviewed discharge instructions to hold antidiabetic medications following cardiac cath. Ensured  patient understands when to resume medications Lab  Results  Component Value Date   HGBA1C 10.5 (A) 11/04/2023    Hypertension Interventions: Last practice recorded BP readings:  BP Readings from Last 3 Encounters:  11/05/23 (!) 151/100  11/04/23 128/88  10/30/23 104/66   Most recent eGFR/CrCl:  Lab Results  Component Value Date   EGFR 107 10/30/2023    No components found for: CRCL  Evaluation of current treatment plan related to hypertension self management and patient's adherence to plan as established by provider Reviewed medications with patient and discussed importance of compliance Provided assistance with obtaining home blood pressure monitor via message sent to PCP requesting order; Reviewed scheduled/upcoming provider appointments including:  Provided education on prescribed diet low sodium Discussed complications of poorly controlled blood pressure such as heart disease, stroke, circulatory complications, vision complications, kidney impairment, sexual dysfunction  Patient Self-Care Activities:  Attend all scheduled provider appointments Call pharmacy for medication refills 3-7 days in advance of running out of medications Call provider office for new concerns or questions  Take medications as prescribed   Work with the pharmacist to address medication management needs and will continue to work with the clinical team to address health care and disease management related needs check blood sugar at prescribed times: as advised by provider enter blood sugar readings and medication or insulin  into daily log take the blood sugar log to all doctor visits call doctor for signs and symptoms of high blood pressure keep all doctor appointments take medications for blood pressure exactly as prescribed report new symptoms to your doctor limit salt intake to 1500 mg/day Obtain BP cuff and scale, begin checking daily  Plan:  Telephone follow up appointment with care management team member scheduled for:  11/12/23 at 1  PM             Patient verbalizes understanding of instructions and care plan provided today and agrees to view in MyChart. Active MyChart status and patient understanding of how to access instructions and care plan via MyChart confirmed with patient.     The Patient                                              will call PCP office* as advised to schedule a follow-up visit with Dr. Koval and Dr. Elicia.  RN Care Manager will send message to PCP requesting order for rollator, BP monitor, and scale. Telephone follow up appointment with Managed Medicaid care management team member scheduled for: 11/12/23 at 1 PM  Rita Finlay, RN MSN [place]  [facility]BC[facility] Population Health RN Care Manager Direct Dial: [phone]  Fax: [phone]   Following is a copy of your plan of care:  There are no care plans that you recently modified to display for this patient.

## 2023-11-06 NOTE — Assessment & Plan Note (Addendum)
-   f/u Heme/Onc for unprovoked PE - Holding Xarelto  for heart cath

## 2023-11-06 NOTE — Assessment & Plan Note (Addendum)
 Return 1-2 weeks after her procedure. If pt is recovering well, provide new referral and letter to PT.

## 2023-11-06 NOTE — Assessment & Plan Note (Signed)
 A1c 10.5, above goal. Is currently off meds in anticipation for heart cath. Emphasized importance of glycemic control to limit her risk of CVD and mortality.  - f/u w/ Dr. Koval today

## 2023-11-06 NOTE — Telephone Encounter (Signed)
 Left Heart Cath  done 11/05/23.

## 2023-11-06 NOTE — Assessment & Plan Note (Addendum)
 F/u w/ GI and ID as above

## 2023-11-06 NOTE — Assessment & Plan Note (Addendum)
-  f/u heart cath as above

## 2023-11-06 NOTE — Progress Notes (Signed)
 Complex Care Management Note  Care Guide Note 11/06/2023 Name: Briteny Fulghum MRN: [id number] DOB: [birth year]  Marval Bobbye Postma is a 58 y.o. year old female who sees [name], MD for primary care. I reached out to Marval Bobbye Kung by phone today to offer complex care management services.  Ms. Dimmitt was given information about Complex Care Management services today including:   The Complex Care Management services include support from the care team which includes your Nurse Care Manager, Clinical Social Worker, or Pharmacist.  The Complex Care Management team is here to help remove barriers to the health concerns and goals most important to you. Complex Care Management services are voluntary, and the patient may decline or stop services at any time by request to their care team member.   Complex Care Management Consent Status: Patient agreed to services and verbal consent obtained.   Follow up plan:  Telephone appointment with complex care management team member scheduled for:  8/20  Encounter Outcome:  Patient Scheduled  Harlene Satterfield  [facility]  [facility] Guide  Direct Dial: [phone]  Fax [phone]

## 2023-11-12 ENCOUNTER — Other Ambulatory Visit: Payer: Self-pay

## 2023-11-12 NOTE — Patient Instructions (Signed)
 Visit Information  Rita Bass was given information about Medicaid Managed Care team care coordination services as a part of their [facility] Medicaid benefit. Rita Bass   If you would like to schedule transportation through your [facility] plan, please call the following number at least 2 days in advance of your appointment: [phone]  For information about your ride after you set it up, call Ride Assist at [phone]. Use this number to activate a Will Call pickup, or if your transportation is late for a scheduled pickup. Use this number, too, if you need to make a change or cancel a previously scheduled reservation.  If you need transportation services right away, call [phone]. The after-hours call center is staffed 24 hours to handle ride assistance and urgent reservation requests (including discharges) 365 days a year. Urgent trips include sick visits, hospital discharge requests and life-sustaining treatment.  Call the [facility] Line at [phone], at any time, 24 hours a day, 7 days a week. If you are in danger or need immediate medical attention call 911.   Rita Bass - following are the goals we discussed in your visit today:   Goals Addressed             This Visit's Progress    VBCI RN Care Plan   On track    Problems:  Chronic Disease Management support and education needs related to CAD, DMII, and HTN  Goal: Over the next 30 days the Patient will attend all scheduled medical appointments: cardiology, PCP, pharmacist as evidenced by completed visit notes uploaded to EMR        demonstrate Improved adherence to prescribed treatment plan for HTN as evidenced by obtaining BP cuff and monitoring at home demonstrate Ongoing health management independence as evidenced by following discharge instructions after cardiac cath 11/05/23        take all medications exactly as prescribed and will call provider for medication related  questions as evidenced by patient report of medication compliance    verbalize basic understanding of DMII disease process and self health management plan as evidenced by patient report of lifestyle modifications to promote improved blood sugar control towards goal of A1c <7% work with pharmacist to address medication management related to DMII as evidenced by review of electronic medical record and patient or pharmacist report     Interventions:   CAD Interventions: Assessed understanding of CAD diagnosis Medications reviewed including medications utilized in CAD treatment plan Provided education on importance of blood pressure control in management of CAD Reviewed Importance of taking all medications as prescribed Reviewed Importance of attending all scheduled provider appointments Advised patient to discuss worsening arm/insertion site pain with provider   Diabetes Interventions: Assessed patient's understanding of A1c goal: <7% Reviewed medications with patient and discussed importance of medication adherence Counseled on importance of regular laboratory monitoring as prescribed Discussed plans with patient for ongoing care management follow up and provided patient with direct contact information for care management team Reviewed scheduled/upcoming provider appointments including: schedule a follow-up with Dr. Koval and Dr. Elicia Advised patient, providing education and rationale, to check cbg as advised by provider and record, calling PCP or Dr. Koval for findings outside established parameters Reviewed how stress can effect blood sugar readings Ensured patient has resumed antidiabetic medications following cardiac cath Lab Results  Component Value Date   HGBA1C 10.5 (A) 11/04/2023    Hypertension Interventions: Last practice recorded BP readings:  BP Readings from Last 3 Encounters:  11/05/23 (!) 151/100  11/04/23 128/88  10/30/23 104/66   Most recent eGFR/CrCl:  Lab  Results  Component Value Date   EGFR 107 10/30/2023    No components found for: CRCL  Evaluation of current treatment plan related to hypertension self management and patient's adherence to plan as established by provider Reviewed medications with patient and discussed importance of compliance Provided assistance with obtaining home blood pressure monitor via message sent to PCP requesting order; Reviewed scheduled/upcoming provider appointments including:  Provided education on prescribed diet low sodium Discussed complications of poorly controlled blood pressure such as heart disease, stroke, circulatory complications, vision complications, kidney impairment, sexual dysfunction  Patient Self-Care Activities:  Attend all scheduled provider appointments Call pharmacy for medication refills 3-7 days in advance of running out of medications Call provider office for new concerns or questions  Take medications as prescribed   Work with the pharmacist to address medication management needs and will continue to work with the clinical team to address health care and disease management related needs check blood sugar at prescribed times: as advised by provider enter blood sugar readings and medication or insulin  into daily log take the blood sugar log to all doctor visits call doctor for signs and symptoms of high blood pressure keep all doctor appointments take medications for blood pressure exactly as prescribed report new symptoms to your doctor limit salt intake to 1500 mg/day Obtain BP cuff and scale, begin checking daily  Plan:  Telephone follow up appointment with care management team member scheduled for:  11/26/23 at 1:30 PM              Patient verbalizes understanding of instructions and care plan provided today and agrees to view in MyChart. Active MyChart status and patient understanding of how to access instructions and care plan via MyChart confirmed with patient.      Telephone follow up appointment with Managed Medicaid care management team member scheduled for: 11/26/23 at 1:30 PM  Rita Finlay, RN MSN [place]  VBCI Population Health RN Care Manager Direct Dial: [phone]  Fax: [phone]   Following is a copy of your plan of care:  There are no care plans that you recently modified to display for this patient.

## 2023-11-12 NOTE — Patient Outreach (Signed)
 Complex Care Management   Visit Note  11/12/2023  Name:  Rita Bass MRN: [id number] DOB: 12-07-1965  Situation: Referral received for Complex Care Management related to Diabetes and HTN I obtained verbal consent from Patient.  Visit completed with Patient  on the phone  Background:   Past Medical History:  Diagnosis Date   Arthritis    Asthma    Chronic pain of left knee    Complication of anesthesia    PONV   Diabetes (HCC)    GERD (gastroesophageal reflux disease)    H/O bronchitis    Hepatic steatosis    Internal hemorrhoids    Kidney stones    Obesity    Renal disorder    Shortness of breath dyspnea    Sleep apnea    past issues - at weight over 400lbs   Sleep apnea in adult    Polysomnogram pending.  Followed by Dr. Shellia    Assessment: Patient Reported Symptoms:  Cognitive Cognitive Status: Able to follow simple commands, Alert and oriented to person, place, and time, Normal speech and language skills Cognitive/Intellectual Conditions Management [RPT]: None reported or documented in medical history or problem list      Neurological Neurological Review of Symptoms: Not assessed    HEENT HEENT Symptoms Reported: Not assessed      Cardiovascular Cardiovascular Symptoms Reported: Other:, Swelling in legs or feet (BLE swelling at baseline per patient) Other Cardiovascular Symptoms: Patient reports pain in chest, under her R arm, and in her R wrist. She has started to use her arm more since it has been a week since cardiac cath performed. She has taken Tylenol  for pain relief. Denies numbness/coldness to the extremity or bleeding at the insertion site. Advised to contact cardiologist to report worsening pain. Does patient have uncontrolled Hypertension?: Yes Is patient checking Blood Pressure at home?: No Cardiovascular Management Strategies: Medication therapy, Routine screening, Adequate rest, Coping strategies, Medical device Cardiovascular Comment:  Message sent to PCP following previous visit requesting order for DME. Waiting for response/orders to be placed. Message resent.  Respiratory Respiratory Symptoms Reported: Shortness of breath Other Respiratory Symptoms: Shortness of breath with exertion, relieved with rest Respiratory Management Strategies: Coping strategies, Medication therapy  Endocrine Endocrine Symptoms Reported: Increased urination Is patient diabetic?: Yes Is patient checking blood sugars at home?: Yes List most recent blood sugar readings, include date and time of day: FreeStyle Libre 3, 200's today Endocrine Comment: Confirmed patient has restarted diabetic medications 2 days ago following cardiac cath discharge instructions.  Gastrointestinal Gastrointestinal Symptoms Reported: Not assessed      Genitourinary Genitourinary Symptoms Reported: Not assessed    Integumentary Integumentary Symptoms Reported: Other Other Integumentary Symptoms: Insertion point R wrist from cardiac cath. Patient reports she is keeping a bandaid over insertion site. Denies drainage/bleeding from insertion site. Skin Management Strategies: Dressing changes, Routine screening  Musculoskeletal Musculoskelatal Symptoms Reviewed: Difficulty walking, Joint pain, Other Other Musculoskeletal Symptoms: Patient reports she has started to use R hand/arm more per instructions following cardiac cath Musculoskeletal Management Strategies: Adequate rest, Coping strategies, Medical device, Routine screening (2 wheel walker) Falls in the past year?: No Number of falls in past year: 1 or less Was there an injury with Fall?: No Fall Risk Category Calculator: 0 Patient Fall Risk Level: Low Fall Risk Patient at Risk for Falls Due to: Impaired balance/gait Fall risk Follow up: Falls evaluation completed, Education provided  Psychosocial Psychosocial Symptoms Reported: Not assessed Additional Psychological Details: Patient reports anxiety today related  to  chest/arm pain          11/12/2023    PHQ2-9 Depression Screening   Little interest or pleasure in doing things    Feeling down, depressed, or hopeless    PHQ-2 - Total Score    Trouble falling or staying asleep, or sleeping too much    Feeling tired or having little energy    Poor appetite or overeating     Feeling bad about yourself - or that you are a failure or have let yourself or your family down    Trouble concentrating on things, such as reading the newspaper or watching television    Moving or speaking so slowly that other people could have noticed.  Or the opposite - being so fidgety or restless that you have been moving around a lot more than usual    Thoughts that you would be better off dead, or hurting yourself in some way    PHQ2-9 Total Score    If you checked off any problems, how difficult have these problems made it for you to do your work, take care of things at home, or get along with other people    Depression Interventions/Treatment      There were no vitals filed for this visit.  Medications Reviewed Today     Reviewed by Arno Rosaline SQUIBB, RN (Registered Nurse) on 11/12/23 at 1310  Med List Status: <None>   Medication Order Taking? Sig Documenting Provider Last Dose Status Informant  Accu-Chek Softclix Lancets lancets [id number]  USE IN THE MORNING, AT NOON AND AT BEDTIME [name], MD  Active Self  acetaminophen  (TYLENOL ) 650 MG CR tablet [id number]  Take 650-1,300 mg by mouth every 8 (eight) hours as needed for pain. Last dose (2) around 0930 am. [provider]  Active Self           Med Note CHRISTIE ALEXANDER   Fri Nov 01, 2023 11:52 AM)    albuterol  (PROVENTIL ) (5 MG/ML) 0.5% nebulizer solution [id number]  Take 0.5 mLs (2.5 mg total) by nebulization every 6 (six) hours as needed for wheezing or shortness of breath. [name], MD  Active Self  Blood Glucose Monitoring Suppl DEVI [id number]  1 each by Does not apply route in the morning, at  noon, and at bedtime. May substitute to any manufacturer covered by patient's insurance. [name], MD  Active Self           Med Note MAUDIE, MAUDE MATSU   Tue Aug 13, 2023  3:12 PM) Hasn't needed since January  Continuous Glucose Receiver ([address] LIBRE 3 READER) DEVI [id number]  1 Device as directed. [provider]  Active Self  Continuous Glucose Sensor (FREESTYLE LIBRE 3 PLUS SENSOR) MISC [id number]  Change sensor every 15 days. McDiarmid, Krystal BIRCH, MD  Active Self  cyclobenzaprine  (FLEXERIL ) 10 MG tablet [id number]  Take 1 tablet (10 mg total) by mouth 2 (two) times daily as needed for muscle spasms. McDiarmid, Krystal BIRCH, MD  Active   diclofenac  Sodium (VOLTAREN ) 1 % GEL [id number]  Apply 2 g topically 4 (four) times daily.  Patient taking differently: Apply 2 g topically in the morning and at bedtime.   Beola Terrall RAMAN, PA-C  Active Self  DULoxetine  (CYMBALTA ) 60 MG capsule [id number]  Take 1 capsule (60 mg total) by mouth daily. [name], MD  Active Self  empagliflozin  (JARDIANCE ) 25 MG TABS tablet [id number] Yes Take 1 tablet (25 mg total) by mouth daily. McDiarmid, Krystal  D, MD  Active Self  gabapentin  (NEURONTIN ) 100 MG capsule [id number]  Take 6 capsules (600 mg total) by mouth 3 (three) times daily. Rosendo Rush, MD  Active Self  glucose blood (ACCU-CHEK GUIDE) test strip [id number]  1 each by Other route daily. for testing McDiarmid, Krystal BIRCH, MD  Active Self  insulin  glargine (LANTUS ) 100 UNIT/ML Solostar Pen [id number] Yes Inject 48 Units into the skin every morning. Inject 30 units subcutaneously every morning. Increase by 2 units each day until sugar < 150. Maximum daily dose 50 units. McDiarmid, Krystal BIRCH, MD  Active   Insulin  Pen Needle (PEN NEEDLES) 32G X 5 MM MISC [id number]  Use as directed McDiarmid, Krystal BIRCH, MD  Active Self  metFORMIN  (GLUCOPHAGE -XR) 500 MG 24 hr tablet [id number] Yes Take 2 tablets (1,000 mg total) by mouth 2 (two) times daily. McDiarmid, Krystal BIRCH, MD  Active Self   mometasone -formoterol  (DULERA ) 100-5 MCG/ACT AERO [id number]  Inhale 2 puffs into the lungs daily.  Patient taking differently: Inhale 2 puffs into the lungs as needed. Patient reports using when she has respiratory symptoms, typically in the Winter   Zheng, Jacky, MD  Active Self           Med Note MAUDIE, MAUDE MATSU   Tue Aug 13, 2023  3:15 PM) Uses PRN for illnesses - often in winter  nystatin  (MYCOSTATIN /NYSTOP ) powder [id number]  Apply 1 Application topically 2 (two) times daily.  Patient taking differently: Apply 1 Application topically 2 (two) times daily as needed (heat rash).   [name], MD  Active Self  olopatadine  (PATANOL) 0.1 % ophthalmic solution [id number]  Place 1 drop into both eyes 2 (two) times daily.  Patient taking differently: Place 1 drop into both eyes 2 (two) times daily as needed for allergies.   Espinoza, Alejandra, DO  Active Self  pantoprazole  (PROTONIX ) 40 MG tablet [id number]  Take 1 tablet (40 mg total) by mouth daily. Patient needs follow up appointment for future refills. Please call [phone] to schedule an appointment.  Patient taking differently: Take 40 mg by mouth daily as needed (reflux). Patient needs follow up appointment for future refills. Please call [phone] to schedule an appointment.   Cirigliano, Vito V, DO  Active Self  rivaroxaban  (XARELTO ) 20 MG TABS tablet [id number]  Take 1 tablet (20 mg total) by mouth daily with supper. Needs to have follow-up appointment before next refill. [name], MD  Active Self  Semaglutide , 2 MG/DOSE, (OZEMPIC , 2 MG/DOSE,) 8 MG/3ML SOPN [id number] Yes Inject 2 mg into the skin once a week. McDiarmid, Krystal BIRCH, MD  Active Self           Med Note (Roshini Fulwider P   Wed Nov 06, 2023  1:44 PM) Sunday  spironolactone  (ALDACTONE ) 25 MG tablet [id number]  Take 1 tablet (25 mg total) by mouth daily. Santo Stanly LABOR, MD  Active Self  traMADol  (ULTRAM ) 50 MG tablet [id number]  Take 1 tablet (50 mg total) by  mouth every 12 (twelve) hours as needed. Not to be refilled before 30 days [name], MD  Active Self  traZODone  (DESYREL ) 50 MG tablet [id number]  Take 1 tablet (50 mg total) by mouth at bedtime as needed for sleep. [name], MD  Active Self            Recommendation:   PCP Follow-up Specialty provider follow-up Patient will contact cardiology to report symptoms of arm/insertion site pain Continue Current Plan of Care  Follow Up Plan:  Telephone follow up appointment date/time:  11/26/23 at 1:30 PM  Rosaline Finlay, RN MSN [place]  [facility] RN Care Manager Direct Dial: [phone]  Fax: [phone]

## 2023-11-14 ENCOUNTER — Other Ambulatory Visit: Payer: Self-pay | Admitting: Family Medicine

## 2023-11-14 DIAGNOSIS — I10 Essential (primary) hypertension: Secondary | ICD-10-CM

## 2023-11-14 DIAGNOSIS — J45909 Unspecified asthma, uncomplicated: Secondary | ICD-10-CM

## 2023-11-14 DIAGNOSIS — M170 Bilateral primary osteoarthritis of knee: Secondary | ICD-10-CM

## 2023-11-14 NOTE — Progress Notes (Unsigned)
 Given pt's severe BL knee OA, increased risk of falls, concurrent use of anticoagulants that increase bleed risk, pt would benefit from rollator walker to assist with mobility.  Pt would also benefit from BP monitor and scale to help with management of HTN, obesity, knee OA.   - DME order placed for rollator walker, scale, BP monitor

## 2023-11-15 ENCOUNTER — Other Ambulatory Visit: Payer: Self-pay | Admitting: Family Medicine

## 2023-11-15 ENCOUNTER — Telehealth: Payer: Self-pay | Admitting: Family Medicine

## 2023-11-15 ENCOUNTER — Other Ambulatory Visit: Payer: Self-pay

## 2023-11-15 DIAGNOSIS — I82402 Acute embolism and thrombosis of unspecified deep veins of left lower extremity: Secondary | ICD-10-CM

## 2023-11-15 DIAGNOSIS — R1033 Periumbilical pain: Secondary | ICD-10-CM

## 2023-11-15 DIAGNOSIS — M170 Bilateral primary osteoarthritis of knee: Secondary | ICD-10-CM

## 2023-11-15 NOTE — Telephone Encounter (Signed)
 Patient called in asking for a refill on her Xarelto  please. States she is taking her last one today. She was supposed to have an appt today, but had transportation issues and rescheduled until 11/27/23.

## 2023-11-19 ENCOUNTER — Inpatient Hospital Stay

## 2023-11-19 ENCOUNTER — Telehealth: Payer: Self-pay | Admitting: *Deleted

## 2023-11-19 ENCOUNTER — Inpatient Hospital Stay: Admitting: Oncology

## 2023-11-19 ENCOUNTER — Other Ambulatory Visit: Payer: Self-pay | Admitting: Family Medicine

## 2023-11-19 DIAGNOSIS — M170 Bilateral primary osteoarthritis of knee: Secondary | ICD-10-CM

## 2023-11-19 DIAGNOSIS — R1033 Periumbilical pain: Secondary | ICD-10-CM

## 2023-11-19 MED ORDER — TRAMADOL HCL 50 MG PO TABS: 50.0000 mg | ORAL_TABLET | Freq: Two times a day (BID) | ORAL | 0 refills | Status: DC | PRN

## 2023-11-19 NOTE — Telephone Encounter (Signed)
 Patient returns call to nurse line regarding tramadol  refill.   Please advise.   Chiquita JAYSON English, RN

## 2023-11-19 NOTE — Telephone Encounter (Signed)
 Patient called to cancel her new patient appointment today. High priority scheduling message sent to reschedule.

## 2023-11-25 ENCOUNTER — Inpatient Hospital Stay: Attending: Oncology | Admitting: Oncology

## 2023-11-25 ENCOUNTER — Encounter: Payer: Self-pay | Admitting: Oncology

## 2023-11-25 ENCOUNTER — Inpatient Hospital Stay

## 2023-11-25 VITALS — BP 144/65 | HR 91 | Temp 97.6°F | Resp 18 | Ht 64.0 in | Wt 311.2 lb

## 2023-11-25 DIAGNOSIS — B9681 Helicobacter pylori [H. pylori] as the cause of diseases classified elsewhere: Secondary | ICD-10-CM | POA: Insufficient documentation

## 2023-11-25 DIAGNOSIS — Z86718 Personal history of other venous thrombosis and embolism: Secondary | ICD-10-CM | POA: Diagnosis not present

## 2023-11-25 DIAGNOSIS — Z803 Family history of malignant neoplasm of breast: Secondary | ICD-10-CM | POA: Diagnosis not present

## 2023-11-25 DIAGNOSIS — Z7901 Long term (current) use of anticoagulants: Secondary | ICD-10-CM | POA: Insufficient documentation

## 2023-11-25 LAB — CMP (CANCER CENTER ONLY)
ALT: 18 U/L (ref 0–44)
AST: 15 U/L (ref 15–41)
Albumin: 4.4 g/dL (ref 3.5–5.0)
Alkaline Phosphatase: 114 U/L (ref 38–126)
Anion gap: 14 (ref 5–15)
BUN: 10 mg/dL (ref 6–20)
CO2: 27 mmol/L (ref 22–32)
Calcium: 10.7 mg/dL — ABNORMAL HIGH (ref 8.9–10.3)
Chloride: 96 mmol/L — ABNORMAL LOW (ref 98–111)
Creatinine: 0.69 mg/dL (ref 0.44–1.00)
GFR, Estimated: >60 mL/min (ref >60)
Glucose, Bld: 283 mg/dL — ABNORMAL HIGH (ref 70–99)
Potassium: 4.2 mmol/L (ref 3.5–5.1)
Sodium: 137 mmol/L (ref 135–145)
Total Bilirubin: 0.5 mg/dL (ref 0.0–1.2)
Total Protein: 8.3 g/dL — ABNORMAL HIGH (ref 6.5–8.1)

## 2023-11-25 LAB — CBC WITH DIFFERENTIAL (CANCER CENTER ONLY)
Abs Immature Granulocytes: 0.03 K/uL (ref 0.00–0.07)
Basophils Absolute: 0 K/uL (ref 0.0–0.1)
Basophils Relative: 0 %
Eosinophils Absolute: 0.3 K/uL (ref 0.0–0.5)
Eosinophils Relative: 4 %
HCT: 43.3 % (ref 36.0–46.0)
Hemoglobin: 13.9 g/dL (ref 12.0–15.0)
Immature Granulocytes: 0 %
Lymphocytes Relative: 27 %
Lymphs Abs: 2.1 K/uL (ref 0.7–4.0)
MCH: 23.6 pg — ABNORMAL LOW (ref 26.0–34.0)
MCHC: 32.1 g/dL (ref 30.0–36.0)
MCV: 73.6 fL — ABNORMAL LOW (ref 80.0–100.0)
Monocytes Absolute: 0.5 K/uL (ref 0.1–1.0)
Monocytes Relative: 7 %
Neutro Abs: 4.8 K/uL (ref 1.7–7.7)
Neutrophils Relative %: 62 %
Platelet Count: 289 K/uL (ref 150–400)
RBC: 5.88 MIL/uL — ABNORMAL HIGH (ref 3.87–5.11)
RDW: 17.2 % — ABNORMAL HIGH (ref 11.5–15.5)
WBC Count: 7.8 K/uL (ref 4.0–10.5)
nRBC: 0 % (ref 0.0–0.2)

## 2023-11-25 LAB — D-DIMER, QUANTITATIVE: D-Dimer, Quant: <0.27 ug{FEU}/mL (ref 0.00–0.50)

## 2023-11-25 NOTE — Assessment & Plan Note (Signed)
 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 Initial ultrasound showed a clot, which improved by June and was not visible by July 2025. However, the July ultrasound may not have been optimal due to significant pain and swelling during the examination. Currently on Xarelto  for anticoagulation. Further evaluation is needed to determine the current status of the DVT.  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  This could have increased risk of her DVT development.  It would be prudent to rule out underlying thrombophilia state.  Hence today we will pursue additional workup with factor V Leiden mutation or prothrombin gene mutation, anticardiolipin antibodies, beta-2  glycoprotein antibodies.  Rest of the thrombophilia workup will be deferred as it can be falsely abnormal because of concurrent Xarelto  use.  -We will order repeat ultrasound of the left lower extremity to assess for DVT.  - Continue Xarelto  once daily for now.  If repeat ultrasound also shows no evidence of DVT, she can discontinue anticoagulation at this time, unless she has thrombophilia state based on above workup.  - Advise stopping Xarelto  3-5 days before any procedures and resuming the day after.  With these precautions, she is okay to undergo EGD and other workup that she may need, especially for her H. pylori management.  I will discuss results of workup over the phone in 2 weeks.  RTC in 3 months for follow-up with repeat labs.
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
## 2023-11-26 ENCOUNTER — Other Ambulatory Visit: Payer: Self-pay

## 2023-11-26 DIAGNOSIS — Z86718 Personal history of other venous thrombosis and embolism: Secondary | ICD-10-CM

## 2023-11-26 NOTE — Patient Instructions (Signed)
 Visit Information  Ms. Hassan was given information about Medicaid Managed Care team care coordination services as a part of their Healthy [facility] Medicaid benefit. Tamryn Popko   If you would like to schedule transportation through your Healthy [facility] plan, please call the following number at least 2 days in advance of your appointment: [phone]  For information about your ride after you set it up, call Ride Assist at [phone]. Use this number to activate a Will Call pickup, or if your transportation is late for a scheduled pickup. Use this number, too, if you need to make a change or cancel a previously scheduled reservation.  If you need transportation services right away, call [phone]. The after-hours call center is staffed 24 hours to handle ride assistance and urgent reservation requests (including discharges) 365 days a year. Urgent trips include sick visits, hospital discharge requests and life-sustaining treatment.  Call the [facility] Line at [phone], at any time, 24 hours a day, 7 days a week. If you are in danger or need immediate medical attention call 911.   Ms. Clute - following are the goals we discussed in your visit today:   Goals Addressed             This Visit's Progress    VBCI RN Care Plan       Problems:  Chronic Disease Management support and education needs related to CAD, DMII, and HTN  Goal: Over the next 30 days the Patient will demonstrate Improved adherence to prescribed treatment plan for HTN as evidenced by obtaining BP cuff and monitoring at home demonstrate Ongoing health management independence as evidenced by following discharge instructions after cardiac cath 11/05/23, attending all follow-up appointments        take all medications exactly as prescribed and will call provider for medication related questions as evidenced by patient report of medication compliance    verbalize basic understanding of  DMII disease process and self health management plan as evidenced by patient report of lifestyle modifications to promote improved blood sugar control towards goal, fasting blood sugar 80-130 work with pharmacist to address medication management related to DMII as evidenced by review of electronic medical record and patient or pharmacist report     Interventions:   CAD Interventions: Assessed understanding of CAD diagnosis Medications reviewed including medications utilized in CAD treatment plan Provided education on importance of blood pressure control in management of CAD Reviewed Importance of taking all medications as prescribed Reviewed Importance of attending all scheduled provider appointments Advised patient to discuss continued arm/insertion site pain with provider   Diabetes Interventions: Assessed patient's understanding of A1c goal: <7% Reviewed medications with patient and discussed importance of medication adherence Counseled on importance of regular laboratory monitoring as prescribed Discussed plans with patient for ongoing care management follow up and provided patient with direct contact information for care management team Reviewed scheduled/upcoming provider appointments including: Dr. Koval and Dr. Elicia Advised patient, providing education and rationale, to check cbg as advised by provider and record, calling PCP or Dr. Koval for findings outside established parameters Provided patient with office number for eye doctor to schedule an appointment Lab Results  Component Value Date   HGBA1C 10.5 (A) 11/04/2023    Hypertension Interventions: Last practice recorded BP readings:  BP Readings from Last 3 Encounters:  11/25/23 (!) 144/65  11/05/23 (!) 151/100  11/04/23 128/88   Most recent eGFR/CrCl:  Lab Results  Component Value Date   EGFR 107 10/30/2023  No components found for: CRCL  Evaluation of current treatment plan related to hypertension self  management and patient's adherence to plan as established by provider Reviewed medications with patient and discussed importance of compliance Provided assistance with obtaining home blood pressure monitor via follow-up on order placed by PCP; Reviewed scheduled/upcoming provider appointments including:  Discussed complications of poorly controlled blood pressure such as heart disease, stroke, circulatory complications, vision complications, kidney impairment, sexual dysfunction Advised patient to write all questions/concerns for provider down and bring to appointment tomorrow  Patient Self-Care Activities:  Attend all scheduled provider appointments Call pharmacy for medication refills 3-7 days in advance of running out of medications Call provider office for new concerns or questions  Perform all self care activities independently  Take medications as prescribed   Work with the pharmacist to address medication management needs and will continue to work with the clinical team to address health care and disease management related needs schedule appointment with eye doctor check blood sugar at prescribed times: as advised by provider enter blood sugar readings and medication or insulin  into daily log take the blood sugar log to all doctor visits call doctor for signs and symptoms of high blood pressure keep all doctor appointments take medications for blood pressure exactly as prescribed report new symptoms to your doctor limit salt intake to 1500 mg/day Obtain BP cuff and scale, begin checking daily  Plan:  Telephone follow up appointment with care management team member scheduled for:  12/10/23 at 1 PM              Patient verbalizes understanding of instructions and care plan provided today and agrees to view in MyChart. Active MyChart status and patient understanding of how to access instructions and care plan via MyChart confirmed with patient.     Telephone follow up  appointment with Managed Medicaid care management team member scheduled for: 12/10/23 at 1 PM  Rosaline Finlay, RN MSN [place]  VBCI Population Health RN Care Manager Direct Dial: [phone]  Fax: [phone]   Following is a copy of your plan of care:  There are no care plans that you recently modified to display for this patient.

## 2023-11-26 NOTE — Patient Outreach (Signed)
 Complex Care Management   Visit Note  11/26/2023  Name:  Rita Bass MRN: [id number] DOB: May 06, 1965  Situation: Referral received for Complex Care Management related to HTN and DM II I obtained verbal consent from Patient.  Visit completed with Patient  on the phone  Background:   Past Medical History:  Diagnosis Date   Arthritis    Asthma    Chronic pain of left knee    Complication of anesthesia    PONV   Diabetes (HCC)    GERD (gastroesophageal reflux disease)    H/O bronchitis    Hepatic steatosis    Internal hemorrhoids    Kidney stones    Obesity    Renal disorder    Shortness of breath dyspnea    Sleep apnea    past issues - at weight over 400lbs   Sleep apnea in adult    Polysomnogram pending.  Followed by Dr. Shellia    Assessment: Patient Reported Symptoms:  Cognitive Cognitive Status: Able to follow simple commands, Alert and oriented to person, place, and time, Normal speech and language skills Cognitive/Intellectual Conditions Management [RPT]: None reported or documented in medical history or problem list      Neurological Neurological Review of Symptoms: Vision changes Neurological Management Strategies: Routine screening  HEENT HEENT Symptoms Reported: Other: HEENT Comment: Patient reports she still has not heard from eye doctor where referral was sent. Provided patient with phone number for [facility]    Cardiovascular Cardiovascular Symptoms Reported: Other:, Swelling in legs or feet (BLE swelling at baseline per patient) Other Cardiovascular Symptoms: Patient reports she has an appointment with cardiology 12/02/23. She continues to experience pain/discomfort in the R wrist when she makes a fist. Does patient have uncontrolled Hypertension?: Yes Is patient checking Blood Pressure at home?: No Cardiovascular Management Strategies: Adequate rest, Coping strategies, Medical device, Medication therapy,  Routine screening Cardiovascular Comment: Patient notes that she had visit with hematologist since previous CMRN visit. She is scheduled for a repeat LLE ultrasound 11/28/23. She has remained on Xarelto  and reports labs were done at this visit. Patient notes that she has not received scale or BP cuff ordered 11/14/23.  Respiratory Respiratory Symptoms Reported: Shortness of breath Other Respiratory Symptoms: Shortness of breath at baseline per patient Additional Respiratory Details: Patient reports hematologist advised that she discuss previously seen lung nodule on imaging with PCP at appointment tomorrow. Respiratory Management Strategies: Coping strategies, Medication therapy  Endocrine Endocrine Symptoms Reported: Increased urination Is patient diabetic?: Yes Is patient checking blood sugars at home?: Yes List most recent blood sugar readings, include date and time of day: FreeStyle Libre 3, up to 388 this morning after breakfast Endocrine Comment: Patient notes that she has an appointment with PCP and Dr. Koval tomorrow. She plans to discuss increased blood sugar at this appointment.  Gastrointestinal Gastrointestinal Symptoms Reported: Abdominal pain or discomfort Additional Gastrointestinal Details: Patient reports she plans to discuss proceeding with treatment for H. Pylori with PCP at appointment tomorrow. She reports she has had some abdominal pain. She denies nausea, vomiting, diarrhea, or constipation. Gastrointestinal Management Strategies: Diet modification    Genitourinary Genitourinary Symptoms Reported: Not assessed    Integumentary Integumentary Symptoms Reported: Other Other Integumentary Symptoms: Patient reports she continues to keep bandaid over insertion point R wrist. Skin Management Strategies: Routine screening Skin Comment: Patient is scheduled for mammogran 11/28/23.  Musculoskeletal Musculoskelatal Symptoms Reviewed: Difficulty walking, Joint pain Musculoskeletal  Management Strategies: Adequate  rest, Coping strategies, Medical device, Routine screening Falls in the past year?: No Number of falls in past year: 1 or less Was there an injury with Fall?: No Fall Risk Category Calculator: 0 Patient Fall Risk Level: Low Fall Risk Patient at Risk for Falls Due to: Impaired balance/gait Fall risk Follow up: Falls evaluation completed, Education provided  Psychosocial Psychosocial Symptoms Reported: Not assessed          11/26/2023    PHQ2-9 Depression Screening   Little interest or pleasure in doing things    Feeling down, depressed, or hopeless    PHQ-2 - Total Score    Trouble falling or staying asleep, or sleeping too much    Feeling tired or having little energy    Poor appetite or overeating     Feeling bad about yourself - or that you are a failure or have let yourself or your family down    Trouble concentrating on things, such as reading the newspaper or watching television    Moving or speaking so slowly that other people could have noticed.  Or the opposite - being so fidgety or restless that you have been moving around a lot more than usual    Thoughts that you would be better off dead, or hurting yourself in some way    PHQ2-9 Total Score    If you checked off any problems, how difficult have these problems made it for you to do your work, take care of things at home, or get along with other people    Depression Interventions/Treatment      There were no vitals filed for this visit.  Medications Reviewed Today     Reviewed by Arno Rosaline SQUIBB, RN (Registered Nurse) on 11/26/23 at 1354  Med List Status: <None>   Medication Order Taking? Sig Documenting Provider Last Dose Status Informant  Accu-Chek Softclix Lancets lancets [id number]  USE IN THE MORNING, AT NOON AND AT BEDTIME [name], MD  Active Self  acetaminophen  (TYLENOL ) 650 MG CR tablet [id number]  Take 650-1,300 mg by mouth every 8 (eight) hours as needed for pain. Last  dose (2) around 0930 am. [provider]  Active Self           Med Note CHRISTIE ALEXANDER   Fri Nov 01, 2023 11:52 AM)    albuterol  (PROVENTIL ) (5 MG/ML) 0.5% nebulizer solution [id number]  Take 0.5 mLs (2.5 mg total) by nebulization every 6 (six) hours as needed for wheezing or shortness of breath. [name], MD  Active Self  Blood Glucose Monitoring Suppl DEVI [id number]  1 each by Does not apply route in the morning, at noon, and at bedtime. May substitute to any manufacturer covered by patient's insurance. [name], MD  Active Self           Med Note MAUDIE, MAUDE MATSU   Tue Aug 13, 2023  3:12 PM) Hasn't needed since January  Continuous Glucose Receiver ([address] LIBRE 3 READER) DEVI [id number]  1 Device as directed. [provider]  Active Self  Continuous Glucose Sensor (FREESTYLE LIBRE 3 PLUS SENSOR) MISC [id number]  Change sensor every 15 days. McDiarmid, Krystal BIRCH, MD  Active Self  cyclobenzaprine  (FLEXERIL ) 10 MG tablet [id number]  Take 1 tablet (10 mg total) by mouth 2 (two) times daily as needed for muscle spasms. McDiarmid, Krystal BIRCH, MD  Active   diclofenac  Sodium (VOLTAREN ) 1 % GEL [id number]  Apply 2 g topically 4 (four) times daily.  Patient taking differently:  Apply 2 g topically in the morning and at bedtime.   Beola Terrall RAMAN, PA-C  Active Self  DULoxetine  (CYMBALTA ) 60 MG capsule [id number]  Take 1 capsule (60 mg total) by mouth daily. [name], MD  Active Self  empagliflozin  (JARDIANCE ) 25 MG TABS tablet [id number]  Take 1 tablet (25 mg total) by mouth daily. McDiarmid, Krystal BIRCH, MD  Active Self  gabapentin  (NEURONTIN ) 100 MG capsule [id number]  Take 6 capsules (600 mg total) by mouth 3 (three) times daily. Rosendo Rush, MD  Active Self  glucose blood (ACCU-CHEK GUIDE) test strip [id number]  1 each by Other route daily. for testing McDiarmid, Krystal BIRCH, MD  Active Self  insulin  glargine (LANTUS ) 100 UNIT/ML Solostar Pen [id number]  Inject 48 Units into the skin every  morning. Inject 30 units subcutaneously every morning. Increase by 2 units each day until sugar < 150. Maximum daily dose 50 units. McDiarmid, Krystal BIRCH, MD  Active   Insulin  Pen Needle (PEN NEEDLES) 32G X 5 MM MISC [id number]  Use as directed McDiarmid, Krystal BIRCH, MD  Active Self  metFORMIN  (GLUCOPHAGE -XR) 500 MG 24 hr tablet [id number]  Take 2 tablets (1,000 mg total) by mouth 2 (two) times daily. McDiarmid, Krystal BIRCH, MD  Active Self  mometasone -formoterol  (DULERA ) 100-5 MCG/ACT AERO [id number]  Inhale 2 puffs into the lungs daily.  Patient taking differently: Inhale 2 puffs into the lungs as needed. Patient reports using when she has respiratory symptoms, typically in the Winter   Zheng, Jacky, MD  Active Self           Med Note MAUDIE, MAUDE MATSU   Tue Aug 13, 2023  3:15 PM) Uses PRN for illnesses - often in winter  nystatin  (MYCOSTATIN /NYSTOP ) powder [id number]  Apply 1 Application topically 2 (two) times daily.  Patient taking differently: Apply 1 Application topically 2 (two) times daily as needed (heat rash).   [name], MD  Active Self  olopatadine  (PATANOL) 0.1 % ophthalmic solution [id number]  Place 1 drop into both eyes 2 (two) times daily.  Patient taking differently: Place 1 drop into both eyes 2 (two) times daily as needed for allergies.   Espinoza, Alejandra, DO  Active Self  pantoprazole  (PROTONIX ) 40 MG tablet [id number]  Take 1 tablet (40 mg total) by mouth daily. Patient needs follow up appointment for future refills. Please call [phone] to schedule an appointment.  Patient taking differently: Take 40 mg by mouth daily as needed (reflux). Patient needs follow up appointment for future refills. Please call [phone] to schedule an appointment.   Cirigliano, Vito V, DO  Active Self  Semaglutide , 2 MG/DOSE, (OZEMPIC , 2 MG/DOSE,) 8 MG/3ML SOPN [id number]  Inject 2 mg into the skin once a week. McDiarmid, Krystal BIRCH, MD  Active Self           Med Note (Angila Wombles P   Wed Nov 06, 2023   1:44 PM) Sunday  spironolactone  (ALDACTONE ) 25 MG tablet [id number]  Take 1 tablet (25 mg total) by mouth daily. Santo Stanly LABOR, MD  Active Self  traMADol  (ULTRAM ) 50 MG tablet [id number]  Take 1 tablet (50 mg total) by mouth every 12 (twelve) hours as needed. Not to be refilled before 30 days [name], MD  Active   traZODone  (DESYREL ) 50 MG tablet [id number]  Take 1 tablet (50 mg total) by mouth at bedtime as needed for sleep. [name], MD  Active Self  XARELTO  20 MG TABS tablet [id number]  TAKE 1 TABLET(20 MG)  BY MOUTH DAILY WITH SUPPER. FOLLOW-UP APPOINTMENT BEFORE NEXT REFILL [name], MD  Active             Recommendation:   PCP Follow-up Continue Current Plan of Care  Follow Up Plan:   Telephone follow up appointment date/time:  12/10/23 at 1 PM  Rosaline Finlay, RN MSN [place]  [facility] RN Care Manager Direct Dial: [phone]  Fax: [phone]

## 2023-11-27 ENCOUNTER — Encounter: Payer: Self-pay | Admitting: Family Medicine

## 2023-11-27 ENCOUNTER — Encounter: Payer: Self-pay | Admitting: Pharmacist

## 2023-11-27 ENCOUNTER — Ambulatory Visit (INDEPENDENT_AMBULATORY_CARE_PROVIDER_SITE_OTHER): Admitting: Family Medicine

## 2023-11-27 ENCOUNTER — Ambulatory Visit (INDEPENDENT_AMBULATORY_CARE_PROVIDER_SITE_OTHER): Admitting: Pharmacist

## 2023-11-27 VITALS — Wt 309.0 lb

## 2023-11-27 VITALS — BP 128/84 | HR 96 | Ht 64.0 in | Wt 309.4 lb

## 2023-11-27 DIAGNOSIS — E1169 Type 2 diabetes mellitus with other specified complication: Secondary | ICD-10-CM

## 2023-11-27 DIAGNOSIS — B9681 Helicobacter pylori [H. pylori] as the cause of diseases classified elsewhere: Secondary | ICD-10-CM | POA: Diagnosis not present

## 2023-11-27 DIAGNOSIS — E785 Hyperlipidemia, unspecified: Secondary | ICD-10-CM | POA: Diagnosis not present

## 2023-11-27 DIAGNOSIS — I82409 Acute embolism and thrombosis of unspecified deep veins of unspecified lower extremity: Secondary | ICD-10-CM | POA: Diagnosis not present

## 2023-11-27 DIAGNOSIS — E1140 Type 2 diabetes mellitus with diabetic neuropathy, unspecified: Secondary | ICD-10-CM

## 2023-11-27 DIAGNOSIS — I2699 Other pulmonary embolism without acute cor pulmonale: Secondary | ICD-10-CM

## 2023-11-27 DIAGNOSIS — K2950 Unspecified chronic gastritis without bleeding: Secondary | ICD-10-CM

## 2023-11-27 DIAGNOSIS — R1084 Generalized abdominal pain: Secondary | ICD-10-CM

## 2023-11-27 DIAGNOSIS — O2230 Deep phlebothrombosis in pregnancy, unspecified trimester: Secondary | ICD-10-CM

## 2023-11-27 DIAGNOSIS — M170 Bilateral primary osteoarthritis of knee: Secondary | ICD-10-CM | POA: Diagnosis not present

## 2023-11-27 LAB — BETA-2-GLYCOPROTEIN I ABS, IGG/M/A
Beta-2 Glyco I IgG: <9 GPI IgG units (ref 0–20)
Beta-2-Glycoprotein I IgA: <9 GPI IgA units (ref 0–25)
Beta-2-Glycoprotein I IgM: <9 GPI IgM units (ref 0–32)

## 2023-11-27 LAB — CARDIOLIPIN ANTIBODIES, IGG, IGM, IGA
Anticardiolipin IgA: <9 U/mL (ref 0–11)
Anticardiolipin IgG: <9 GPL U/mL (ref 0–14)
Anticardiolipin IgM: <9 [MPL'U]/mL (ref 0–12)

## 2023-11-27 MED ORDER — ATORVASTATIN CALCIUM 40 MG PO TABS: 40.0000 mg | ORAL_TABLET | Freq: Every day | ORAL | 3 refills | Status: AC

## 2023-11-27 MED ORDER — CYCLOBENZAPRINE HCL 10 MG PO TABS: 10.0000 mg | ORAL_TABLET | Freq: Two times a day (BID) | ORAL | 2 refills | Status: DC | PRN

## 2023-11-27 MED ORDER — GABAPENTIN 100 MG PO CAPS
600.0000 mg | ORAL_CAPSULE | Freq: Three times a day (TID) | ORAL | 2 refills | Status: AC
Start: 2023-11-27 — End: ?

## 2023-11-27 NOTE — Assessment & Plan Note (Signed)
 Diabetes longstanding currently uncontrolled, likely related to sedentary lifestyle, dietary preferences and recent/current elevated stress. Patient is able to verbalize appropriate hypoglycemia management plan. Control is suboptimal due to suboptimal medication regimen, diet and physical inactivity. She has a lot of stress currently due to pain, multiple doctors appointments, and daughters upcoming graduation. - Continued Ozempic  (semaglutide ) 2 mg weekly on Sundays, metformin  XR 1,000 mg BID, Jardiance  (empagliflozin ) 25 mg daily, Lantus  (insulin  glargine) 48 units daily.  -Patient educated on purpose, proper use, and potential adverse effects.  -Extensively discussed lifestyle interventions, and lengthy discussion (>15 mins) on dietary effects on blood sugar control.  -Counseled on s/sx of and management of hypoglycemia.

## 2023-11-27 NOTE — Progress Notes (Unsigned)
    SUBJECTIVE:   CHIEF COMPLAINT / HPI:   DM is a 58yo F that pf f/u.  - Saw Heme/Onc 9/8. They plan to continue Xarelto , repeat US , and if US  neg, then stop Xarelto . They are also checking hypercoag labs; if these are positive, then they will continue Xarelto . It is ok for her to hold anticoag for procedures such as EGD.  - Had heart cath, which did NOT show any significant blockage.    - Reports she's getting sharp stabbing pain her belly button starting yesterday. Reports having normal appetite.  - She tried to make appt to see GI, but was told she needs a new referral. - She is still having a lot of knee pain, especially R knee. It is interfering with her sleep.  - Going back to Cardiologist on Monday.   - Needs a new referral for her to see Dr. San w/ [facility]. She previously had to go to [facility] for specific scope test, but she reports she is having trouble with transportation.    OBJECTIVE:   BP 128/84   Pulse 96   Ht 5' 4 (1.626 m)   Wt (!) 309 lb 6.4 oz (140.3 kg)   LMP 04/05/2016   SpO2 97%   BMI 53.11 kg/m   General: Alert, pleasant woman. NAD. HEENT: NCAT. MMM. CV: RRR, no murmurs. Cap refill <2. Resp: CTAB, no wheezing or crackles. Normal WOB on RA.  Abm: TTP periumbilical area, no hernias or masses felt. No rebound, rigidity, or guarding. Soft, nondistended. BS present. Ext: Moves all ext spontaneously Skin: Warm, well perfused   ASSESSMENT/PLAN:   Assessment & Plan Acute deep vein thrombosis (DVT) of lower extremity, unspecified laterality, unspecified vein (HCC) Awaiting DVT US  and hypercoag labs per Heme Onv, but is then safe to stop Xarelto  if these are negative. Primary osteoarthritis of both knees Pain still uncontrolled. If DVT resolved (see above), then plan for pt to return to Sports Med to re-evaluate for nerve block or knee injections. Long term, pt would benefit from weight loss and knee replacement surgery.  - Referral to PT sent. Pt is  safe to participate in PT.  Chronic Helicobacter pylori gastritis Safe to hold Xarelto  for scopes. PT can return to GI and ID for re-evaluation of chronic Hpylori treatment.  - Referral to GI - Per Hematologist, pt is safe to hold Xarelto  3-5 days before any procedure and resume the day after. They are ok with her pursing EGD and other procedures needed for her Hpylori infection.      [name], MD [facility]

## 2023-11-27 NOTE — Assessment & Plan Note (Signed)
 ASCVD risk - primary prevention in patient with diabetes. Last LDL is 115 (2022) not at goal of <70 mg/dL.  - High intensity statin indicated.  - Restarted atorvastatin  40 mg once daily

## 2023-11-27 NOTE — Progress Notes (Signed)
 S:     Chief Complaint  Patient presents with   Medication Management    DM   58 y.o. female who presents for diabetes evaluation, education, and management. Patient arrives in good spirits and presents without any assistance.Patient is accompanied by her daughter, Samule.   Reports heart cath went okay, but she was awake and aware the whole time which she did not like. Reports she went to a hematologist recently, who mentioned a blood clot in the past and she scheduled her for an ultrasound on her leg which is bothering her and impacting her ability to work. 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 Her daughter is graduating this week!!  Patient was referred and last seen by Primary Care Provider, Dr. Elicia, on 11/27/23.  At last visit, referred patient to GI MD for H. Pylori management.   PMH is significant for T2DM, CAD, hypertension, Hyperlipidemia, hx of DVT, diastolic heart failure, OA, chronic H.pylori.  Patient reports Diabetes was diagnosed in 2019.    Current diabetes medications include: Ozempic  (semaglutide ) 2 mg weekly on Sundays, metformin  XR 1,000 mg BID, Jardiance  (empagliflozin ) 25 mg daily, Lantus  (insulin  glargine) 48 units daily.  Current hypertension medications include: spironolactone  25 mg.  Current hyperlipidemia medications include: none currently.  Insurance coverage: [place] Medicaid  Dinner: eating more vegetables (green beans, corn on the cob, collard greens, carrots, broccoli) , cutting back on fried foods - eating baked foods instead, soups -Eats quite a few carbohydrate containing foods (conchas, tortillas, etc)  - Cheats on her diet on the weekends -- Fri & Sat eat tacos twice daily, Sun eats out   Patient-reported exercise habits: - Reports that when she walks around she has rapid heart rate that bothers her  - Tries to do housework  O:   Review of Systems  Musculoskeletal:  Positive for joint pain.       Leg pain related  to arthritis and DVT  All other systems reviewed and are negative.   Physical Exam Constitutional:      Appearance: Normal appearance. She is normal weight.  Neurological:     Mental Status: She is alert.  Psychiatric:        Mood and Affect: Mood normal.        Behavior: Behavior normal.        Thought Content: Thought content normal.        Judgment: Judgment normal.     Did not bring her Libre reader - BGs: 200-220s - last few days: sugars in mid to high 300s  Lab Results  Component Value Date   HGBA1C 10.5 (A) 11/04/2023     Lipid Panel     Component Value Date/Time   CHOL 192 06/21/2020 1547   TRIG 131 06/21/2020 1547   HDL 54 06/21/2020 1547   CHOLHDL 3.6 06/21/2020 1547   LDLCALC 115 (H) 06/21/2020 1547   LDLDIRECT 117 (H) 02/28/2021 0917    Clinical Atherosclerotic Cardiovascular Disease (ASCVD): Yes   A/P: Diabetes longstanding currently uncontrolled, likely related to sedentary lifestyle, dietary preferences and recent/current elevated stress. Patient is able to verbalize appropriate hypoglycemia management plan. Control is suboptimal due to suboptimal medication regimen, diet and physical inactivity. She has a lot of stress currently due to pain, multiple doctors appointments, and daughters upcoming graduation. - Continued Ozempic  (semaglutide ) 2 mg weekly on Sundays, metformin  XR 1,000 mg BID, Jardiance  (empagliflozin ) 25 mg daily, Lantus  (insulin  glargine) 48 units daily.  -  Patient educated on purpose, proper use, and potential adverse effects.  -Extensively discussed lifestyle interventions, and lengthy discussion (>15 mins) on dietary effects on blood sugar control.  -Counseled on s/sx of and management of hypoglycemia.   ASCVD risk - primary prevention in patient with diabetes. Last LDL is 115 (2022) not at goal of <70 mg/dL.  - High intensity statin indicated.  - Restarted atorvastatin  40 mg once daily    Written patient instructions provided.  Patient verbalized understanding of treatment plan.  Total time in face to face counseling 29 minutes.    Follow-up:  Pharmacist 01/01/2024 PCP clinic visit in 2 months Patient seen with Fonda Blase, PharmD Candidate - PY3 student and Estefana Blase, PharmD Candidate - PY4 student.

## 2023-11-27 NOTE — Patient Instructions (Addendum)
 It was nice to see you today!  Your goal blood sugar is 80-130 before eating and less than 180 after eating.  Medication Changes: START atorvastatin  40 mg 1 tablet one time daily   Continue all other medication the same.   Monitor blood sugars at home and keep a log (glucometer or piece of paper) to bring with you to your next visit. Call the office in a few weeks about what your sugars are looking like.   Keep up the good work with diet and exercise. Aim for a diet full of vegetables, fruit and lean meats (chicken, [place], fish). Try to limit salt intake by eating fresh or frozen vegetables (instead of canned), rinse canned vegetables prior to cooking and do not add any additional salt to meals.   The best fruits for you to try to eat are berries. Bananas and melons have a lot of sugar so try to limit your intake of those.   Try to cut back on corn on the cob and cooked carrots, as well as cutting back on your carbohydrate intake (maybe cut your conchas in half). Your green vegetable intake is great!

## 2023-11-27 NOTE — Patient Instructions (Addendum)
 1) I have sent a new referral for you physical therapist. Also ask your Cardiologist to provide a letter that you are safe to do physical therapy.   2) I am sending a new referral for you to see [facility]. They should reach out to you about scheduling.   3) If the DVT ultrasound is negative for blood clots, you should be able to go back to sports medicine to see what other treatment options they have for your knee pain.   4) I sent refills for you gabapentin  and flexeril .   5) Come back to see me in 2 months

## 2023-11-28 ENCOUNTER — Ambulatory Visit (HOSPITAL_COMMUNITY)
Admission: RE | Admit: 2023-11-28 | Discharge: 2023-11-28 | Disposition: A | Discharge status: Home / Self Care | Source: Ambulatory Visit | Attending: Vascular Surgery | Admitting: Vascular Surgery

## 2023-11-28 DIAGNOSIS — Z86718 Personal history of other venous thrombosis and embolism: Secondary | ICD-10-CM | POA: Diagnosis not present

## 2023-11-28 LAB — FACTOR 5 LEIDEN

## 2023-11-28 NOTE — Assessment & Plan Note (Signed)
 Pain still uncontrolled. If DVT resolved (see above), then plan for pt to return to Sports Med to re-evaluate for nerve block or knee injections. Long term, pt would benefit from weight loss and knee replacement surgery.  - Referral to PT sent. Pt is safe to participate in PT.

## 2023-11-28 NOTE — Assessment & Plan Note (Signed)
 Safe to hold Xarelto  for scopes. PT can return to GI and ID for re-evaluation of chronic Hpylori treatment.  - Referral to GI - Per Hematologist, pt is safe to hold Xarelto  3-5 days before any procedure and resume the day after. They are ok with her pursing EGD and other procedures needed for her Hpylori infection.

## 2023-11-29 LAB — PROTHROMBIN GENE MUTATION

## 2023-11-29 NOTE — Progress Notes (Signed)
 Reviewed and agree with Dr Rennis plan.

## 2023-12-02 ENCOUNTER — Ambulatory Visit: Admitting: Cardiology

## 2023-12-02 ENCOUNTER — Other Ambulatory Visit (HOSPITAL_COMMUNITY): Payer: Self-pay

## 2023-12-02 ENCOUNTER — Telehealth: Payer: Self-pay

## 2023-12-02 NOTE — Telephone Encounter (Signed)
 Prior authorization submitted for FREESTYLE LIBRE 3 PLUS SENSORS to [employer] via Latent.   Key: [id number]

## 2023-12-03 NOTE — Telephone Encounter (Signed)
 Pharmacy Patient Advocate Encounter  Received notification from [employer] that Prior Authorization for [facility] PLUS SENSORS has been APPROVED from 12/02/23 to 12/01/24   PA #/Case ID/Reference #: [id number]

## 2023-12-06 ENCOUNTER — Ambulatory Visit

## 2023-12-09 ENCOUNTER — Inpatient Hospital Stay (HOSPITAL_BASED_OUTPATIENT_CLINIC_OR_DEPARTMENT_OTHER): Admitting: Oncology

## 2023-12-09 DIAGNOSIS — Z86718 Personal history of other venous thrombosis and embolism: Secondary | ICD-10-CM

## 2023-12-10 ENCOUNTER — Telehealth: Payer: Self-pay

## 2023-12-10 NOTE — Progress Notes (Signed)
 Patient had a phone appointment with us  but there was no answer despite multiple attempts.  Will reschedule her appointment.

## 2023-12-10 NOTE — Patient Outreach (Signed)
 Care Coordination   12/10/2023 Name: Rita Bass MRN: [id number] DOB: November 22, 1965   Care Coordination Outreach Attempts:  An unsuccessful outreach was attempted for an appointment today.  Follow Up Plan:  Additional outreach attempts will be made to complete CCM follow-up visit.   Encounter Outcome:  No Answer. HIPAA compliant voicemail left requesting return call.   Rosaline Finlay, RN MSN [place]  [facility] RN Care Manager Direct Dial: [phone]  Fax: [phone]

## 2023-12-11 NOTE — Patient Outreach (Signed)
 Care Coordination   12/11/2023 Name: Rita Bass MRN: [id number] DOB: 11-01-1965   Care Coordination Outreach Attempts:  A second unsuccessful outreach was attempted today to complete CCM follow-up visit.  Follow Up Plan:  Additional outreach attempts will be made to complete follow-up visit.   Encounter Outcome:  No Answer. HIPAA compliant voicemail left requesting return call   Rosaline Finlay, RN MSN [place]  [facility] RN Care Manager Direct Dial: [phone]  Fax: [phone]

## 2023-12-11 NOTE — Patient Instructions (Signed)
 [place] - I am sorry I was unable to reach you 12/10/23 for our scheduled appointment. I work with [name], MD and am calling to support your healthcare needs. Please contact me at [phone] at your earliest convenience. I look forward to speaking with you soon.   Thank you,  Rosaline Finlay, RN MSN [place]  [facility] RN Care Manager Direct Dial: [phone]  Fax: [phone]

## 2023-12-13 ENCOUNTER — Ambulatory Visit

## 2023-12-13 NOTE — Patient Outreach (Signed)
 Care Coordination   12/13/2023 Name: Rita Bass MRN: [id number] DOB: 02-Nov-1965   Care Coordination Outreach Attempts: A third unsuccessful outreach was attempted today to complete CCM follow-up visit.  Follow Up Plan:  No further outreach attempts will be made at this time. We have been unable to contact the patient to complete follow-up visit.  Encounter Outcome:  No Answer. HIPAA compliant voicemail left requesting return call.   Rosaline Finlay, RN MSN [place]  [facility] RN Care Manager Direct Dial: [phone]  Fax: [phone]

## 2023-12-13 NOTE — Patient Instructions (Signed)
 [employer] - I have attempted to call you three times but have been unsuccessful in reaching you. I work with [name], MD and am calling to support your healthcare needs. If I can be of assistance to you, please contact me at [phone].     Thank you,  Rosaline Finlay, RN MSN [place]  [facility] RN Care Manager Direct Dial: [phone]  Fax: [phone]

## 2023-12-16 ENCOUNTER — Other Ambulatory Visit: Payer: Self-pay

## 2023-12-16 ENCOUNTER — Ambulatory Visit (HOSPITAL_COMMUNITY)
Admission: EM | Admit: 2023-12-16 | Discharge: 2023-12-16 | Disposition: A | Discharge status: Home / Self Care | Attending: Family Medicine | Admitting: Family Medicine

## 2023-12-16 ENCOUNTER — Encounter (HOSPITAL_COMMUNITY): Payer: Self-pay | Admitting: *Deleted

## 2023-12-16 DIAGNOSIS — N3090 Cystitis, unspecified without hematuria: Secondary | ICD-10-CM | POA: Diagnosis not present

## 2023-12-16 LAB — POCT URINALYSIS DIP (MANUAL ENTRY)
Glucose, UA: 500 mg/dL — AB
Nitrite, UA: POSITIVE — AB
Protein Ur, POC: >=300 mg/dL — AB
Spec Grav, UA: 1.01 (ref 1.010–1.025)
Urobilinogen, UA: >=8 U/dL — AB
pH, UA: 5 (ref 5.0–8.0)

## 2023-12-16 MED ORDER — CIPROFLOXACIN HCL 500 MG PO TABS: 500.0000 mg | ORAL_TABLET | Freq: Two times a day (BID) | ORAL | 0 refills | Status: AC

## 2023-12-16 NOTE — ED Triage Notes (Signed)
 PT reports dysuria and urinary urgency started yesterday.

## 2023-12-16 NOTE — ED Provider Notes (Signed)
 [facility]    CSN: [id number] Arrival date & time: 12/16/23  0944      History   Chief Complaint Chief Complaint  Patient presents with   Dysuria   Urinary Urgency    HPI Rita Bass is a 58 y.o. female.    Dysuria Here for pain on urination and incomplete bladder emptying and urinary frequency.  Symptoms began about 2 days ago.  She has not had any fever or chills or nausea or vomiting.  No back pain.  She does have diabetes and sugars have been up-and-down.  Her last A1c in August of this year was 10.5.  She does take Jardiance  AZO over-the-counter did help her symptoms.  NKDA  Last eGFR was greater than 60 earlier this month.  Past Medical History:  Diagnosis Date   Arthritis    Asthma    Chronic pain of left knee    Complication of anesthesia    PONV   Diabetes (HCC)    GERD (gastroesophageal reflux disease)    H/O bronchitis    Hepatic steatosis    Internal hemorrhoids    Kidney stones    Obesity    Renal disorder    Shortness of breath dyspnea    Sleep apnea    past issues - at weight over 400lbs   Sleep apnea in adult    Polysomnogram pending.  Followed by Dr. 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   Diabetic nephropathy associated with type 2 diabetes mellitus (HCC) 10/25/2017   Type 2 diabetes mellitus with complication (HCC) 09/13/2017   Sleep apnea 08/16/2017   Chronic pain of right knee 07/13/2016   Morbid obesity (HCC) 02/04/2015   Menorrhagia with regular cycle 11/18/2014    Past Surgical History:  Procedure Laterality Date   BIOPSY  11/01/2020   Procedure: BIOPSY;  Surgeon: San Sandor GAILS, DO;  Location: [facility];  Service: Gastroenterology;;   BIOPSY  05/11/2021   Procedure: BIOPSY;  Surgeon: San Sandor GAILS, DO;  Location: [facility];  Service: Gastroenterology;;   BIOPSY  11/29/2022   Procedure: BIOPSY;  Surgeon: San Sandor GAILS, DO;  Location: [facility];  Service: Gastroenterology;;   CESAREAN SECTION     6 c-sections   CHOLECYSTECTOMY     COLONOSCOPY WITH PROPOFOL  N/A 05/11/2021   Procedure: COLONOSCOPY WITH PROPOFOL ;  Surgeon: San Sandor GAILS, DO;  Location: [facility];  Service: Gastroenterology;  Laterality: N/A;   ESOPHAGOGASTRODUODENOSCOPY (EGD) WITH PROPOFOL  N/A 11/01/2020    Procedure: ESOPHAGOGASTRODUODENOSCOPY (EGD) WITH PROPOFOL ;  Surgeon: San Sandor GAILS, DO;  Location: [facility];  Service: Gastroenterology;  Laterality: N/A;   ESOPHAGOGASTRODUODENOSCOPY (EGD) WITH PROPOFOL  N/A 11/29/2022   Procedure: ESOPHAGOGASTRODUODENOSCOPY (EGD) WITH PROPOFOL ;  Surgeon: San Sandor GAILS, DO;  Location: [facility];  Service: Gastroenterology;  Laterality: N/A;   HERNIA REPAIR     KIDNEY STONE SURGERY     LEFT HEART CATH AND CORONARY ANGIOGRAPHY N/A 11/05/2023   Procedure: LEFT HEART CATH AND CORONARY ANGIOGRAPHY;  Surgeon: Anner Alm ORN, MD;  Location: [facility]AS[facility] CV [facility];  Service: Cardiovascular;  Laterality: N/A;   OOPHORECTOMY     TRANSTHORACIC ECHOCARDIOGRAM  09/2017   Technically difficult study.  Did not use Definity  contrast.  EF was 55 to 60% with moderate LVH and grade 1 diastolic dysfunction.  No significant valvular lesions noted.  No regional wall motion normality but difficult to assess due to poor imaging.    TUBAL LIGATION     UPPER GASTROINTESTINAL ENDOSCOPY      OB History     Gravida  6   Para  6   Term  5   Preterm  1   AB  0   Living  7      SAB  0   IAB  0   Ectopic  0   Multiple  2   Live Births               Home Medications    Prior to Admission medications   Medication Sig Start Date End Date Taking? Authorizing Provider  Accu-Chek Softclix Lancets lancets USE IN THE [place], AT NOON AND AT BEDTIME 05/06/23  Yes [name], MD  acetaminophen  (TYLENOL ) 650 MG CR tablet Take 650-1,300 mg by mouth every 8 (eight) hours as needed for pain. Last dose (2) around 0930 am.   Yes [provider]  albuterol  (PROVENTIL ) (5 MG/ML) 0.5% nebulizer solution Take 0.5 mLs (2.5 mg total) by nebulization every 6 (six) hours as needed for wheezing or shortness of breath. 02/25/23  Yes [name], MD  atorvastatin  (LIPITOR) 40 MG tablet Take 1 tablet (40 mg total) by mouth daily. 11/27/23  Yes McDiarmid, Krystal BIRCH, MD   Blood Glucose Monitoring Suppl DEVI 1 each by Does not apply route in the morning, at noon, and at bedtime. May substitute to any manufacturer covered by patient's insurance. 03/26/23  Yes [name], MD  ciprofloxacin (CIPRO) 500 MG tablet Take 1 tablet (500 mg total) by mouth 2 (two) times daily for 7 days. 12/16/23 12/23/23 Yes Ayomide Zuleta, Sharlet POUR, MD  Continuous Glucose Receiver (FREESTYLE LIBRE 3 READER) DEVI 1 Device as directed. 07/04/23  Yes [provider]  Continuous Glucose Sensor (FREESTYLE LIBRE 3 PLUS SENSOR) MISC Change sensor every 15 days. 07/01/23  Yes McDiarmid, Krystal BIRCH, MD  cyclobenzaprine  (FLEXERIL ) 10 MG tablet Take 1 tablet (10 mg total) by mouth 2 (two) times daily as needed for muscle spasms. 11/27/23  Yes Zheng,  Twyla, MD  diclofenac  Sodium (VOLTAREN ) 1 % GEL Apply 2 g topically 4 (four) times daily. Patient taking differently: Apply 2 g topically in the morning and at bedtime. 09/25/23  Yes Beola Terrall RAMAN, PA-C  DULoxetine  (CYMBALTA ) 60 MG capsule Take 1 capsule (60 mg total) by mouth daily. 07/16/23  Yes Elicia Twyla, MD  empagliflozin  (JARDIANCE ) 25 MG TABS tablet Take 1 tablet (25 mg total) by mouth daily. 08/13/23  Yes McDiarmid, Krystal BIRCH, MD  gabapentin  (NEURONTIN ) 100 MG capsule Take 6 capsules (600 mg total) by mouth 3 (three) times daily. 11/27/23  Yes Elicia Twyla, MD  glucose blood (ACCU-CHEK GUIDE) test strip 1 each by Other route daily. for testing 01/16/23  Yes McDiarmid, Krystal BIRCH, MD  insulin  glargine (LANTUS ) 100 UNIT/ML Solostar Pen Inject 48 Units into the skin every morning. Inject 30 units subcutaneously every morning. Increase by 2 units each day until sugar < 150. Maximum daily dose 50 units. 11/04/23  Yes McDiarmid, Krystal BIRCH, MD  Insulin  Pen Needle (PEN NEEDLES) 32G X 5 MM MISC Use as directed 08/13/23  Yes McDiarmid, Krystal BIRCH, MD  metFORMIN  (GLUCOPHAGE -XR) 500 MG 24 hr tablet Take 2 tablets (1,000 mg total) by mouth 2 (two) times daily. 08/13/23  Yes McDiarmid,  Krystal BIRCH, MD  mometasone -formoterol  (DULERA ) 100-5 MCG/ACT AERO Inhale 2 puffs into the lungs daily. Patient taking differently: Inhale 2 puffs into the lungs as needed. Patient reports using when she has respiratory symptoms, typically in the Winter 10/22/22  Yes Elicia Twyla, MD  nystatin  (MYCOSTATIN /NYSTOP ) powder Apply 1 Application topically 2 (two) times daily. Patient taking differently: Apply 1 Application topically 2 (two) times daily as needed (heat rash). 09/19/23  Yes Elicia Twyla, MD  olopatadine  (PATANOL) 0.1 % ophthalmic solution Place 1 drop into both eyes 2 (two) times daily. Patient taking differently: Place 1 drop into both eyes 2 (two) times daily as needed for allergies. 07/28/21  Yes Espinoza, Alejandra, DO  pantoprazole  (PROTONIX ) 40 MG tablet Take 1 tablet (40 mg total) by mouth daily. Patient needs follow up appointment for future refills. Please call [phone] to schedule an appointment. Patient taking differently: Take 40 mg by mouth daily as needed (reflux). Patient needs follow up appointment for future refills. Please call [phone] to schedule an appointment. 09/02/23  Yes Cirigliano, Vito V, DO  Semaglutide , 2 MG/DOSE, (OZEMPIC , 2 MG/DOSE,) 8 MG/3ML SOPN Inject 2 mg into the skin once a week. 08/13/23  Yes McDiarmid, Krystal BIRCH, MD  spironolactone  (ALDACTONE ) 25 MG tablet Take 1 tablet (25 mg total) by mouth daily. 06/27/23  Yes Chandrasekhar, Mahesh A, MD  traMADol  (ULTRAM ) 50 MG tablet Take 1 tablet (50 mg total) by mouth every 12 (twelve) hours as needed. Not to be refilled before 30 days 11/19/23  Yes Elicia Twyla, MD  traZODone  (DESYREL ) 50 MG tablet Take 1 tablet (50 mg total) by mouth at bedtime as needed for sleep. 07/16/23  Yes Elicia Twyla, MD  XARELTO  20 MG TABS tablet TAKE 1 TABLET(20 MG) BY MOUTH DAILY WITH SUPPER. FOLLOW-UP APPOINTMENT BEFORE NEXT REFILL 11/15/23  Yes Elicia Twyla, MD    Family History Family History  Problem Relation Age of Onset   Breast cancer  Mother    Other Father        committed suicide   Diabetes Father    Heart attack Maternal Aunt    Colon cancer Neg Hx    Esophageal cancer Neg Hx    Pancreatic cancer Neg Hx  Stomach cancer Neg Hx    Liver disease Neg Hx    Colon polyps Neg Hx    Rectal cancer Neg Hx     Social History Social History   Tobacco Use   Smoking status: Never    Passive exposure: Never   Smokeless tobacco: Never  Vaping Use   Vaping status: Never Used  Substance Use Topics   Alcohol use: No   Drug use: No     Allergies   Patient has no known allergies.   Review of Systems Review of Systems  Genitourinary:  Positive for dysuria.     Physical Exam Triage Vital Signs ED Triage Vitals  Encounter Vitals Group     BP 12/16/23 1009 113/85     Girls Systolic BP Percentile --      Girls Diastolic BP Percentile --      Boys Systolic BP Percentile --      Boys Diastolic BP Percentile --      Pulse Rate 12/16/23 1009 85     Resp 12/16/23 1009 20     Temp 12/16/23 1009 98.4 F (36.9 C)     Temp src --      SpO2 12/16/23 1009 92 %     Weight --      Height --      Head Circumference --      Peak Flow --      Pain Score 12/16/23 1006 0     Pain Loc --      Pain Education --      Exclude from Growth Chart --    No data found.  Updated Vital Signs BP 113/85   Pulse 85   Temp 98.4 F (36.9 C)   Resp 20   LMP 04/05/2016   SpO2 92%   Visual Acuity Right Eye Distance:   Left Eye Distance:   Bilateral Distance:    Right Eye Near:   Left Eye Near:    Bilateral Near:     Physical Exam Vitals reviewed.  Constitutional:      General: She is not in acute distress.    Appearance: She is not ill-appearing, toxic-appearing or diaphoretic.  HENT:     Mouth/Throat:     Mouth: Mucous membranes are moist.  Cardiovascular:     Rate and Rhythm: Normal rate and regular rhythm.     Heart sounds: No murmur heard. Pulmonary:     Effort: Pulmonary effort is normal.     Breath  sounds: Normal breath sounds.  Abdominal:     Palpations: Abdomen is soft.     Tenderness: There is no abdominal tenderness. There is no right CVA tenderness or left CVA tenderness.  Skin:    Coloration: Skin is not jaundiced or pale.  Neurological:     General: No focal deficit present.     Mental Status: She is alert and oriented to person, place, and time.  Psychiatric:        Behavior: Behavior normal.      UC Treatments / Results  Labs (all labs ordered are listed, but only abnormal results are displayed) Labs Reviewed  POCT URINALYSIS DIP (MANUAL ENTRY) - Abnormal; Notable for the following components:      Result Value   Color, UA red (*)    Clarity, UA cloudy (*)    Glucose, UA =500 (*)    Bilirubin, UA moderate (*)    Ketones, POC UA small (15) (*)    Blood, UA  trace-lysed (*)    Protein Ur, POC >=300 (*)    Urobilinogen, UA >=8.0 (*)    Nitrite, UA Positive (*)    Leukocytes, UA Large (3+) (*)    All other components within normal limits  URINE CULTURE    EKG   Radiology No results found.  Procedures Procedures (including critical care time)  Medications Ordered in UC Medications - No data to display  Initial Impression / Assessment and Plan / UC Course  I have reviewed the triage vital signs and the nursing notes.  Pertinent labs & imaging results that were available during my care of the patient were reviewed by me and considered in my medical decision making (see chart for details).     There is a large amount of leuks and nitrites on the UA dip.  Urine culture is sent;  staff will notify her if she needs to change antibiotic.  Cipro is sent in to treat the cystitis.  She already has Pyridium at home.   Final Clinical Impressions(s) / UC Diagnoses   Final diagnoses:  Cystitis     Discharge Instructions      The urinalysis showed white blood cells and nitrites, consistent with a urine infection.  Take Cipro 500 mg--1 tablet 2 times  daily for 7 days  Urine culture is sent, and staff will notify you that it looks like the antibiotic needs to be changed.  Continue taking the AZO you have already purchased over-the-counter as needed  Make sure you are drinking plenty of oral fluids.     ED Prescriptions     Medication Sig Dispense Auth. Provider   ciprofloxacin (CIPRO) 500 MG tablet Take 1 tablet (500 mg total) by mouth 2 (two) times daily for 7 days. 14 tablet Gayle Collard K, MD      PDMP not reviewed this encounter.   Vonna Sharlet POUR, MD 12/16/23 1044

## 2023-12-16 NOTE — Discharge Instructions (Signed)
 The urinalysis showed white blood cells and nitrites, consistent with a urine infection.  Take Cipro 500 mg--1 tablet 2 times daily for 7 days  Urine culture is sent, and staff will notify you that it looks like the antibiotic needs to be changed.  Continue taking the AZO you have already purchased over-the-counter as needed  Make sure you are drinking plenty of oral fluids.

## 2023-12-18 ENCOUNTER — Ambulatory Visit (HOSPITAL_COMMUNITY): Payer: Self-pay

## 2023-12-18 LAB — URINE CULTURE: Culture: >=100000 — AB

## 2023-12-23 ENCOUNTER — Other Ambulatory Visit: Payer: Self-pay

## 2023-12-23 DIAGNOSIS — M170 Bilateral primary osteoarthritis of knee: Secondary | ICD-10-CM

## 2023-12-23 DIAGNOSIS — R1033 Periumbilical pain: Secondary | ICD-10-CM

## 2023-12-24 MED ORDER — TRAMADOL HCL 50 MG PO TABS: 50.0000 mg | ORAL_TABLET | Freq: Two times a day (BID) | ORAL | 0 refills | Status: DC | PRN

## 2023-12-24 NOTE — Progress Notes (Unsigned)
 12/25/2023 Rita Bass [id number] 06/18/1965  Referring provider: [name], MD Primary GI doctor: Dr. San  ASSESSMENT AND PLAN:  Loose stools worse post prandial, no oily stools, s/p cholecystectomy 05/11/2021 colonoscopy with Dr. Kemp, showed grade 2 internal hemorrhoids, foal active colitis post likely NSAIDS, meds Recall colon 10 years.  On Ozempic  Previous constipation - check Cdiff with multiple antibiotics -KUB to evaluate for stool burden/obstruction -Can do trial of IBGARD daily,  Bentyl  as needed -Add on citracel/benefiber, FODMAP, trial off lactulose and lifestyle changes discussed -Will do trial of colestipol for possible bile diarrhea -Consider SIBO testing or xifaxin trial pending results - consider pelvic floor PT - consider pancreatic elastase but no oily stools  History of H. pylori gastritis 11/29/2022 EGD LA grade a reflux esophagitis, nonbleeding gastric ulcer no stigmata of bleeding, gastritis normal duodenum.  Path negative celiac positive H. pylori negative for metaplasia or dysplasia. 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 Autumn in oncology 11/25/2023 Negative workup  Morbid obesity  Body mass index is 53.21 kg/m.  -Patient has been advised to make an attempt to improve diet and exercise patterns to aid in weight loss. -Recommended diet heavy in fruits and veggies and low in animal meats, cheeses, and dairy products, appropriate calorie intake  Type 2 diabetes On Ozempic   Nonobstructive CAD/grade 1 diastolic dysfunction Cardiac catheterization 11/05/2023 minimal disease 40% mid LAD normal LVEDP  Patient  Care Team: [name], MD as PCP - General (Family Medicine) Anner Alm ORN, MD as PCP - Cardiology (Cardiology)  HISTORY OF PRESENT ILLNESS: 58 y.o. female with a past medical history listed below presents for evaluation of H pylori/loose stools.   Last seen in the office 10/23/2022 by Con Blower for known history of H. pylori gastritis with persistently positive breath testing  Discussed the use of AI scribe software for clinical note transcription with the patient, who gave verbal consent to proceed.  History of Present Illness   Rita Bass is a 58 year old female with a history of H. pylori gastritis who presents with persistent gastrointestinal symptoms and concerns about H. pylori infection. She was referred by Dr. Zane for evaluation of persistent H. pylori infection and gastrointestinal symptoms.  She has a history of H. pylori gastritis, initially diagnosed in August 2024, with persistent positive tests despite multiple treatments. An endoscopy in September 2024 showed inflammation and an ulcer but was negative for cancer and dysplasia. She completed a course of antibiotics prescribed by infectious disease in October 2024. She is unsure if she was retested for H. pylori after treatment, but does not recall a test being performed. She has undergone multiple breath tests for H. pylori, with the last one in May 2024, and has not been retested since the infectious disease treatment.  She experiences ongoing stomach pain and diarrhea, which occurs shortly after eating, regardless of the type of food consumed. This has been a persistent issue, with a brief period of relief following her hernia surgery in 2023. The diarrhea resumed approximately four months ago, occurring four to five times a day, and is described as loose but not watery, oily, or  floating. She experiences urgency and frequent bowel movements, impacting her ability to eat in public and participate in family  activities. No bloating or gas since her surgery and no nocturnal bowel movements, but she experiences frequent urination at night due to diabetes.  Her past medical history includes a hernia repair in 2023, which initially alleviated symptoms of vomiting and diarrhea caused by a bowel blockage. She also has a history of heart issues and a blood clot in her leg, for which she was treated with Zaltrap and continues on blood thinners due to high risk factors. She has diabetes, managed with Ozempic , and is on pantoprazole  for stomach issues. She has arthritis in both knees, which was exacerbated by the blood clot, affecting her mobility and requiring a wheelchair at one point. She is frustrated with her symptoms and the impact on her daily life, particularly during family outings.     She  reports that she has never smoked. She has never been exposed to tobacco smoke. She has never used smokeless tobacco. She reports that she does not drink alcohol and does not use drugs.  RELEVANT GI HISTORY, IMAGING AND LABS: Results   RADIOLOGY Ultrasound: No blood clot detected (11/2023)  DIAGNOSTIC Endoscopy: Inflammation, ulcer, positive for H. pylori, negative for cancer, no dysplasia, no abnormality, no metaplasia (12/2022)      CBC    Component Value Date/Time   WBC 7.8 11/25/2023 1142   WBC 9.1 09/25/2023 1521   RBC 5.88 (H) 11/25/2023 1142   HGB 13.9 11/25/2023 1142   HGB 13.6 10/30/2023 0910   HCT 43.3 11/25/2023 1142   HCT 44.0 10/30/2023 0910   PLT 289 11/25/2023 1142   PLT 300 10/30/2023 0910   MCV 73.6 (L) 11/25/2023 1142   MCV 76 (L) 10/30/2023 0910   MCH 23.6 (L) 11/25/2023 1142   MCHC 32.1 11/25/2023 1142   RDW 17.2 (H) 11/25/2023 1142   RDW 15.8 (H) 10/30/2023 0910   LYMPHSABS 2.1 11/25/2023 1142   MONOABS 0.5 11/25/2023 1142   EOSABS 0.3 11/25/2023 1142   BASOSABS 0.0 11/25/2023 1142   Recent Labs    03/18/23 2230 03/19/23 0201 03/20/23 0835 04/24/23 1253 08/03/23 1302  08/13/23 1609 09/25/23 1521 10/30/23 0910 11/25/23 1142  HGB 13.4 13.3 12.5 14.1 13.4 14.2 13.8 13.6 13.9    CMP     Component Value Date/Time   NA 137 11/25/2023 1142   NA 139 10/30/2023 0910   K 4.2 11/25/2023 1142   CL 96 (L) 11/25/2023 1142   CO2 27 11/25/2023 1142   GLUCOSE 283 (H) 11/25/2023 1142   GLUCOSE 305 (A) 04/24/2023 1150   BUN 10 11/25/2023 1142   BUN 8 10/30/2023 0910   CREATININE 0.69 11/25/2023 1142   CALCIUM  10.7 (H) 11/25/2023 1142   PROT 8.3 (H) 11/25/2023 1142   PROT 7.5 06/14/2022 1711   ALBUMIN 4.4 11/25/2023 1142   ALBUMIN 4.3 06/14/2022 1711   AST 15 11/25/2023 1142   ALT 18 11/25/2023 1142   ALKPHOS 114 11/25/2023 1142   BILITOT 0.5 11/25/2023 1142   GFRNONAA >60 11/25/2023 1142   GFRAA 129 10/05/2019 1648      Latest Ref Rng & Units 11/25/2023   11:42 AM 04/24/2023   12:53 PM 03/18/2023   10:30 PM  Hepatic Function  Total Protein 6.5 - 8.1 g/dL 8.3  7.6  8.2   Albumin 3.5 - 5.0 g/dL 4.4  4.0  4.1   AST 15 - 41 U/L 15  20  22   ALT 0 - 44 U/L 18  24  25    Alk Phosphatase 38 - 126 U/L 114  77  79   Total Bilirubin 0.0 - 1.2 mg/dL 0.5  0.4  0.6       Current Medications:   Current Outpatient Medications (Endocrine & Metabolic):    empagliflozin  (JARDIANCE ) 25 MG TABS tablet, Take 1 tablet (25 mg total) by mouth daily.   insulin  glargine (LANTUS ) 100 UNIT/ML Solostar Pen, Inject 48 Units into the skin every morning. Inject 30 units subcutaneously every morning. Increase by 2 units each day until sugar < 150. Maximum daily dose 50 units.   metFORMIN  (GLUCOPHAGE -XR) 500 MG 24 hr tablet, Take 2 tablets (1,000 mg total) by mouth 2 (two) times daily.   Semaglutide , 2 MG/DOSE, (OZEMPIC , 2 MG/DOSE,) 8 MG/3ML SOPN, Inject 2 mg into the skin once a week.  Current Outpatient Medications (Cardiovascular):    atorvastatin  (LIPITOR) 40 MG tablet, Take 1 tablet (40 mg total) by mouth daily.   colestipol (COLESTID) 1 g tablet, Take 2 tablets (2 g total)  by mouth 2 (two) times daily.   spironolactone  (ALDACTONE ) 25 MG tablet, Take 1 tablet (25 mg total) by mouth daily.  Current Outpatient Medications (Respiratory):    albuterol  (PROVENTIL ) (5 MG/ML) 0.5% nebulizer solution, Take 0.5 mLs (2.5 mg total) by nebulization every 6 (six) hours as needed for wheezing or shortness of breath.   mometasone -formoterol  (DULERA ) 100-5 MCG/ACT AERO, Inhale 2 puffs into the lungs daily. (Patient taking differently: Inhale 2 puffs into the lungs as needed. Patient reports using when she has respiratory symptoms, typically in the Winter)  Current Outpatient Medications (Analgesics):    acetaminophen  (TYLENOL ) 650 MG CR tablet, Take 650-1,300 mg by mouth every 8 (eight) hours as needed for pain. Last dose (2) around 0930 am.   traMADol  (ULTRAM ) 50 MG tablet, Take 1 tablet (50 mg total) by mouth every 12 (twelve) hours as needed. Not to be refilled before 30 days  Current Outpatient Medications (Hematological):    XARELTO  20 MG TABS tablet, TAKE 1 TABLET(20 MG) BY MOUTH DAILY WITH SUPPER. FOLLOW-UP APPOINTMENT BEFORE NEXT REFILL  Current Outpatient Medications (Other):    Accu-Chek Softclix Lancets lancets, USE IN THE MORNING, AT NOON AND AT BEDTIME   Blood Glucose Monitoring Suppl DEVI, 1 each by Does not apply route in the morning, at noon, and at bedtime. May substitute to any manufacturer covered by patient's insurance.   Continuous Glucose Receiver (FREESTYLE LIBRE 3 READER) DEVI, 1 Device as directed.   Continuous Glucose Sensor (FREESTYLE LIBRE 3 PLUS SENSOR) MISC, Change sensor every 15 days.   cyclobenzaprine  (FLEXERIL ) 10 MG tablet, Take 1 tablet (10 mg total) by mouth 2 (two) times daily as needed for muscle spasms.   diclofenac  Sodium (VOLTAREN ) 1 % GEL, Apply 2 g topically 4 (four) times daily. (Patient taking differently: Apply 2 g topically in the morning and at bedtime.)   DULoxetine  (CYMBALTA ) 60 MG capsule, Take 1 capsule (60 mg total) by mouth  daily.   gabapentin  (NEURONTIN ) 100 MG capsule, Take 6 capsules (600 mg total) by mouth 3 (three) times daily.   glucose blood (ACCU-CHEK GUIDE) test strip, 1 each by Other route daily. for testing   Insulin  Pen Needle (PEN NEEDLES) 32G X 5 MM MISC, Use as directed   nystatin  (MYCOSTATIN /NYSTOP ) powder, Apply 1 Application topically 2 (two) times daily. (Patient taking differently: Apply 1 Application topically 2 (two) times daily as needed (heat rash).)  olopatadine  (PATANOL) 0.1 % ophthalmic solution, Place 1 drop into both eyes 2 (two) times daily. (Patient taking differently: Place 1 drop into both eyes 2 (two) times daily as needed for allergies.)   pantoprazole  (PROTONIX ) 40 MG tablet, Take 1 tablet (40 mg total) by mouth daily. Patient needs follow up appointment for future refills. Please call [phone] to schedule an appointment. (Patient taking differently: Take 40 mg by mouth daily as needed (reflux). Patient needs follow up appointment for future refills. Please call [phone] to schedule an appointment.)   traZODone  (DESYREL ) 50 MG tablet, Take 1 tablet (50 mg total) by mouth at bedtime as needed for sleep.  Medical History:  Past Medical History:  Diagnosis Date   Arthritis    Asthma    Chronic pain of left knee    Complication of anesthesia    PONV   Diabetes (HCC)    GERD (gastroesophageal reflux disease)    H/O bronchitis    Hepatic steatosis    Internal hemorrhoids    Kidney stones    Obesity    Renal disorder    Shortness of breath dyspnea    Sleep apnea    past issues - at weight over 400lbs   Sleep apnea in adult    Polysomnogram pending.  Followed by Dr. Shellia   Allergies: No Known Allergies   Surgical History:  She  has a past surgical history that includes Oophorectomy; Cesarean section; Cholecystectomy; Hernia repair; Tubal ligation; Kidney stone surgery; transthoracic echocardiogram (09/2017); Esophagogastroduodenoscopy (egd) with propofol  (N/A,  11/01/2020); biopsy (11/01/2020); Upper gastrointestinal endoscopy; Colonoscopy with propofol  (N/A, 05/11/2021); biopsy (05/11/2021); Esophagogastroduodenoscopy (egd) with propofol  (N/A, 11/29/2022); biopsy (11/29/2022); and LEFT HEART CATH AND CORONARY ANGIOGRAPHY (N/A, 11/05/2023). Family History:  Her family history includes Breast cancer in her mother; Diabetes in her father; Heart attack in her maternal aunt; Other in her father.  REVIEW OF SYSTEMS  : All other systems reviewed and negative except where noted in the History of Present Illness.  PHYSICAL EXAM: BP 130/80   Pulse 94   Ht 5' 4 (1.626 m)   Wt (!) 310 lb (140.6 kg)   LMP 04/05/2016   BMI 53.21 kg/m  Physical Exam   GENERAL APPEARANCE: Well nourished, in no apparent distress HEENT: No cervical lymphadenopathy, unremarkable thyroid, sclerae anicteric, conjunctiva pink RESPIRATORY: Respiratory effort normal, BS equal bilateral without rales, rhonchi, wheezing CARDIO: RRR with no MRGs, peripheral pulses intact ABDOMEN: Soft, non distended, active bowel sounds in all 4 quadrants, no tenderness to palpation, no rebound, no mass appreciated RECTAL: declines MUSCULOSKELETAL: Full ROM, normal gait, without edema SKIN: Dry, intact without rashes or lesions. No jaundice. NEURO: Alert, oriented, no focal deficits PSYCH: Cooperative, normal mood and affect.      Alan JONELLE Coombs, PA-C 11:09 AM

## 2023-12-25 ENCOUNTER — Encounter: Payer: Self-pay | Admitting: Physician Assistant

## 2023-12-25 ENCOUNTER — Other Ambulatory Visit: Payer: Self-pay | Admitting: Physician Assistant

## 2023-12-25 ENCOUNTER — Ambulatory Visit (INDEPENDENT_AMBULATORY_CARE_PROVIDER_SITE_OTHER)
Admission: RE | Admit: 2023-12-25 | Discharge: 2023-12-25 | Disposition: A | Discharge status: Home / Self Care | Source: Ambulatory Visit | Attending: Physician Assistant | Admitting: Physician Assistant

## 2023-12-25 ENCOUNTER — Ambulatory Visit: Admitting: Physician Assistant

## 2023-12-25 VITALS — BP 130/80 | HR 94 | Ht 64.0 in | Wt 310.0 lb

## 2023-12-25 DIAGNOSIS — B9681 Helicobacter pylori [H. pylori] as the cause of diseases classified elsewhere: Secondary | ICD-10-CM | POA: Diagnosis not present

## 2023-12-25 DIAGNOSIS — R1013 Epigastric pain: Secondary | ICD-10-CM

## 2023-12-25 DIAGNOSIS — R197 Diarrhea, unspecified: Secondary | ICD-10-CM

## 2023-12-25 DIAGNOSIS — I2510 Atherosclerotic heart disease of native coronary artery without angina pectoris: Secondary | ICD-10-CM

## 2023-12-25 DIAGNOSIS — Z6841 Body Mass Index (BMI) 40.0 and over, adult: Secondary | ICD-10-CM | POA: Diagnosis not present

## 2023-12-25 DIAGNOSIS — Z9049 Acquired absence of other specified parts of digestive tract: Secondary | ICD-10-CM

## 2023-12-25 DIAGNOSIS — G4733 Obstructive sleep apnea (adult) (pediatric): Secondary | ICD-10-CM

## 2023-12-25 MED ORDER — COLESTIPOL HCL 1 G PO TABS: 2.0000 g | ORAL_TABLET | Freq: Two times a day (BID) | ORAL | 0 refills | Status: DC

## 2023-12-25 NOTE — Patient Instructions (Addendum)
 Your provider has requested that you have an abdominal x ray before leaving today. Please go to the basement floor to our Radiology department for the test.  Due to recent changes in healthcare laws, you may see the results of your imaging and laboratory studies on MyChart before your provider has had a chance to review them.  We understand that in some cases there may be results that are confusing or concerning to you. Not all laboratory results come back in the same time frame and the provider may be waiting for multiple results in order to interpret others.  Please give us  48 hours in order for your provider to thoroughly review all the results before contacting the office for clarification of your results.   We are ordering a [employer] stool testing for you to take home and complete.  You have received a kit from our office today containing all necessary supplies to complete this test.  Please carefully read the stool collection instructions provided in the kit before opening the accompanying materials  OR an easier way is to please use toilet paper to wipe after your bowel movement and use the qtip/applicator provided to get a small volume of the stool from the toilet paper and place that in the tube.   Important to remember: -Place the label onto the puritan opti-swab TUBE. -This label should include your full name and date of birth.  - This label should have the DATE AND TIME stool was collects -After completing the test, you should secure the tube into the specimen biohazard bag.  -The laboratory request information sheet (including date and time of specimen collection) should be placed into the outside pocket of the specimen biohazard bag and returned to the [facility] lab with 2 days of collection.   If it is greater than two days from collection you will be asked to repeat the test.  If the label is missing from the tube with your name, date of birth, date and time of collection on it,  you will have to repeat the test.  Any questions please message us  on my chart or call the office at [phone]   FIBER SUPPLEMENT You can do metamucil or fibercon once or twice a day but if this causes gas/bloating please switch to Benefiber or Citracel.  Fiber is good for constipation/diarrhea/irritable bowel syndrome.  It can also help with weight loss and can help lower your bad cholesterol (LDL).  Please do 1 TBSP in the morning in water , coffee, or tea.  It can take up to a month before you can see a difference with your bowel movements.  It is cheapest from [employer], [employer], [employer].   Try the colestipol 1 gram start once daily, can can increase up to 2 pills twice a day, can cause constipation  First do a trial off milk/lactose products if you use them.  Add fiber like benefiber or citracel once a day Increase activity   Thank you for trusting me with your gastrointestinal care!   Alan Coombs, PA-C   _______________________________________________________  If your blood pressure at your visit was 140/90 or greater, please contact your primary care physician to follow up on this.  _______________________________________________________  If you are age 71 or older, your body mass index should be between 23-30. Your Body mass index is 53.21 kg/m. If this is out of the aforementioned range listed, please consider follow up with your Primary Care Provider.  If you are age 61 or younger, your  body mass index should be between 19-25. Your Body mass index is 53.21 kg/m. If this is out of the aformentioned range listed, please consider follow up with your Primary Care Provider.   ________________________________________________________  The [facility] providers would like to encourage you to use MYCHART to communicate with providers for non-urgent requests or questions.  Due to long hold times on the telephone, sending your provider a message by [facility] may be a faster and  more efficient way to get a response.  Please allow 48 business hours for a response.  Please remember that this is for non-urgent requests.  _______________________________________________________  [facility] is using a team-based approach to care.  Your team is made up of your doctor and two to three APPS. Our APPS (Nurse Practitioners and Physician Assistants) work with your physician to ensure care continuity for you. They are fully qualified to address your health concerns and develop a treatment plan. They communicate directly with your gastroenterologist to care for you. Seeing the Advanced Practice Practitioners on your physician's team can help you by facilitating care more promptly, often allowing for earlier appointments, access to diagnostic testing, procedures, and other specialty referrals.     FODMAP stands for fermentable oligo-, di-, mono-saccharides and polyols (1). These are the scientific terms used to classify groups of carbs that are difficult for our body to digest and that are notorious for triggering digestive symptoms like bloating, gas, loose stools and stomach pain.   You can try low FODMAP diet  - start with eliminating just one column at a time that you feel may be a trigger for you. - the table at the very bottom contains foods that are low in FODMAPs   Sometimes trying to eliminate the FODMAP's from your diet is difficult or tricky, if you are stuggling with trying to do the elimination diet you can try an enzyme.  There is a food enzymes that you sprinkle in or on your food that helps break down the FODMAP. You can read more about the enzyme by going to this site: https://fodzyme.com/

## 2023-12-30 ENCOUNTER — Other Ambulatory Visit: Payer: Self-pay

## 2023-12-30 ENCOUNTER — Telehealth: Payer: Self-pay

## 2023-12-30 ENCOUNTER — Ambulatory Visit: Payer: Self-pay | Admitting: Physician Assistant

## 2023-12-30 DIAGNOSIS — K6289 Other specified diseases of anus and rectum: Secondary | ICD-10-CM

## 2023-12-30 DIAGNOSIS — K625 Hemorrhage of anus and rectum: Secondary | ICD-10-CM

## 2023-12-30 MED ORDER — HYDROCORTISONE (PERIANAL) 2.5 % EX CREA: 1.0000 | TOPICAL_CREAM | Freq: Two times a day (BID) | CUTANEOUS | 2 refills | Status: AC

## 2023-12-30 NOTE — Telephone Encounter (Signed)
 Patient calls nurse line requesting refill on hydrocortisone  rectal cream. She reports that she has hemorrhoids that have flared up and that this has helped in the past.   If appropriate, please send to [facility]   Chiquita JAYSON English, RN

## 2023-12-30 NOTE — Telephone Encounter (Signed)
 Patient calls nurse line regarding PA expiring on Tramadol .   PA submitted via covermymeds.   Key: [id number]  Will check status in 24-48 hours.   Chiquita JAYSON English, RN

## 2024-01-01 ENCOUNTER — Ambulatory Visit: Admitting: Pharmacist

## 2024-01-01 ENCOUNTER — Telehealth: Payer: Self-pay | Admitting: Physician Assistant

## 2024-01-01 NOTE — Telephone Encounter (Signed)
 Negative [employer] C. difficile collected 10/8 I thought it also added C. difficile with H. pylori can we see if this can also be added for history of H. pylori and GERD?

## 2024-01-01 NOTE — Telephone Encounter (Signed)
 [facility] contacted. [facility] stated that they did run the H Pylori.  Results were faxed and provided to Alan Coombs PA.

## 2024-01-02 ENCOUNTER — Telehealth: Payer: Self-pay | Admitting: Oncology

## 2024-01-02 NOTE — Telephone Encounter (Signed)
 Pharmacy Patient Advocate Encounter  Received notification from [employer] that Prior Authorization for TRAMADOL  50MG  has been APPROVED from 12/30/23 to 06/27/24

## 2024-01-02 NOTE — Telephone Encounter (Signed)
 [facility] H pylori negative  Can cancel [facility] referral. Patient treated with Talicia  and this shows erradication.

## 2024-01-02 NOTE — Telephone Encounter (Signed)
 Rescheduled PT FU Telephone appt. Day and time confirmed.

## 2024-01-02 NOTE — Telephone Encounter (Signed)
 Inbound call from patient requesting a call to discuss update regarding results. Please advise, thank you

## 2024-01-03 ENCOUNTER — Ambulatory Visit: Attending: Family Medicine

## 2024-01-03 DIAGNOSIS — M170 Bilateral primary osteoarthritis of knee: Secondary | ICD-10-CM | POA: Diagnosis not present

## 2024-01-03 DIAGNOSIS — M25561 Pain in right knee: Secondary | ICD-10-CM | POA: Insufficient documentation

## 2024-01-03 DIAGNOSIS — M6281 Muscle weakness (generalized): Secondary | ICD-10-CM | POA: Insufficient documentation

## 2024-01-03 DIAGNOSIS — R2689 Other abnormalities of gait and mobility: Secondary | ICD-10-CM | POA: Diagnosis not present

## 2024-01-03 DIAGNOSIS — G8929 Other chronic pain: Secondary | ICD-10-CM | POA: Insufficient documentation

## 2024-01-03 DIAGNOSIS — M25562 Pain in left knee: Secondary | ICD-10-CM | POA: Diagnosis not present

## 2024-01-03 NOTE — Telephone Encounter (Signed)
 Pharmacy has been updated.   Patient picked up prescription on 10/14.

## 2024-01-03 NOTE — Therapy (Signed)
 OUTPATIENT PHYSICAL THERAPY LOWER EXTREMITY EVALUATION   Patient Name: Rita Bass MRN: [id number] DOB:[birth year], 58 y.o., female Today's Date: 01/06/2024  END OF SESSION:  PT End of Session - 01/06/24 0919     Visit Number 1    Number of Visits 17    Date for Recertification  02/28/24    Authorization Type [employer]    PT Start Time 1023   arrived lat   PT Stop Time 1100    PT Time Calculation (min) 37 min    Activity Tolerance Patient limited by pain    Behavior During Therapy WFL for tasks assessed/performed          Past Medical History:  Diagnosis Date   Arthritis    Asthma    Chronic pain of left knee    Complication of anesthesia    PONV   Diabetes (HCC)    GERD (gastroesophageal reflux disease)    H/O bronchitis    Hepatic steatosis    Internal hemorrhoids    Kidney stones    Obesity    Renal disorder    Shortness of breath dyspnea    Sleep apnea    past issues - at weight over 400lbs   Sleep apnea in adult    Polysomnogram pending.  Followed by Dr. Shellia   Past Surgical History:  Procedure Laterality Date   BIOPSY  11/01/2020   Procedure: BIOPSY;  Surgeon: San Sandor GAILS, DO;  Location: [facility];  Service: Gastroenterology;;   BIOPSY  05/11/2021   Procedure: BIOPSY;  Surgeon: San Sandor GAILS, DO;  Location: [facility];  Service: Gastroenterology;;   BIOPSY  11/29/2022   Procedure: BIOPSY;  Surgeon: San Sandor GAILS, DO;  Location: [facility];  Service: Gastroenterology;;   CESAREAN SECTION     6 c-sections   CHOLECYSTECTOMY     COLONOSCOPY WITH PROPOFOL  N/A 05/11/2021   Procedure: COLONOSCOPY WITH PROPOFOL ;  Surgeon: San Sandor GAILS, DO;  Location: [facility];  Service: Gastroenterology;  Laterality: N/A;   ESOPHAGOGASTRODUODENOSCOPY (EGD) WITH PROPOFOL  N/A 11/01/2020   Procedure: ESOPHAGOGASTRODUODENOSCOPY (EGD) WITH PROPOFOL ;  Surgeon: San Sandor GAILS, DO;  Location: [facility];  Service: Gastroenterology;   Laterality: N/A;   ESOPHAGOGASTRODUODENOSCOPY (EGD) WITH PROPOFOL  N/A 11/29/2022   Procedure: ESOPHAGOGASTRODUODENOSCOPY (EGD) WITH PROPOFOL ;  Surgeon: San Sandor GAILS, DO;  Location: [facility];  Service: Gastroenterology;  Laterality: N/A;   HERNIA REPAIR     KIDNEY STONE SURGERY     LEFT HEART CATH AND CORONARY ANGIOGRAPHY N/A 11/05/2023   Procedure: LEFT HEART CATH AND CORONARY ANGIOGRAPHY;  Surgeon: Anner Alm ORN, MD;  Location: [facility];  Service: Cardiovascular;  Laterality: N/A;   OOPHORECTOMY     TRANSTHORACIC ECHOCARDIOGRAM  09/2017   Technically difficult study.  Did not use Definity  contrast.  EF was 55 to 60% with moderate LVH and grade 1 diastolic dysfunction.  No significant valvular lesions noted.  No regional wall motion normality but difficult to assess due to poor imaging.    TUBAL LIGATION     UPPER GASTROINTESTINAL ENDOSCOPY     Patient Active Problem List   Diagnosis Date Noted   History of deep venous thrombosis (DVT) of distal vein of left lower extremity 11/25/2023   Palpitations 06/27/2023   Coronary artery disease involving native coronary artery of native heart without angina pectoris 06/27/2023   Sleep disturbance 06/20/2023   Shortness of breath 03/18/2023   Chronic abdominal pain 12/24/2022   Medication management 12/21/2022  Chronic Helicobacter pylori gastritis 11/29/2022   Gastric ulcer without hemorrhage or perforation 11/29/2022   Gastroesophageal reflux disease with esophagitis without hemorrhage 11/29/2022   Change in bowel habits 11/29/2022   Diarrhea 11/29/2022   Asthma 10/22/2022   Primary osteoarthritis of both knees 10/03/2022   Type 2 diabetes mellitus with diabetic neuropathy, unspecified (HCC) 04/30/2022   Chronic osteoarthritis 04/30/2022   Rash 01/15/2022   External hemorrhoid 09/15/2021   Ventral hernia 08/16/2021   Allergic conjunctivitis 07/28/2021   Grade II internal hemorrhoids    H. 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   Diabetic nephropathy associated with type 2 diabetes mellitus (HCC) 10/25/2017   Type 2 diabetes mellitus with complication (HCC) 09/13/2017   Sleep apnea 08/16/2017   Chronic pain of right knee 07/13/2016   Morbid obesity (HCC) 02/04/2015   Menorrhagia with regular cycle 11/18/2014    PCP: [name], MD  REFERRING PROVIDER: McDiarmid, Krystal BIRCH, MD   REFERRING DIAG: M17.0 (ICD-10-CM) - Primary osteoarthritis of both knees   THERAPY DIAG:  Chronic pain of right knee  Chronic pain of left knee  Other abnormalities of gait and mobility  Muscle weakness (generalized)  Rationale for Evaluation and Treatment: Rehabilitation  ONSET DATE: Chronic  SUBJECTIVE:   SUBJECTIVE STATEMENT: Pt presents to PT with reports of chronic bilateral knee pain with recent R>L. Pt is well known to PT, has complex PMH in setting of cardiovascular system. Recent concern for L DVT, awaiting results but they sent her home from ED on 12/16/2023. Also waiting until stable to address H. Pylori at [facility] in a few months. Lateral knee pain bilaterally, pain with transfers and overall mobility.   PERTINENT HISTORY: DM II, Neuropathy, HTN, CHF  PAIN:  Are you having pain?  Yes: NPRS scale: 7/10 Worst: 10/10 Pain location: bilateral knees Pain description: sharp, sore Aggravating factors: standing, transfers,  walking Relieving factors: rest, medication  PRECAUTIONS: Fall  RED FLAGS: None   WEIGHT BEARING RESTRICTIONS: No  FALLS:  Has patient fallen in last 6 months? Yes. Number of falls numerous - no falls to floor but has fallen backwards on bed or chair with transfers  LIVING ENVIRONMENT: Lives with: lives with their daughter Lives in: House/apartment Stairs: Yes: External: 3 steps; on right going up Has following equipment at home: None  OCCUPATION: Not working  PLOF: Independent with basic ADLs  PATIENT GOALS: decrease knee pain, be able to move better  OBJECTIVE:  Note: Objective measures were completed at Evaluation unless otherwise noted.  DIAGNOSTIC FINDINGS: see imaging   PATIENT SURVEYS:  LEFS  Extreme difficulty/unable (0), Quite a bit of difficulty (1), Moderate difficulty (2), Little difficulty (3), No difficulty (4) Survey date:  01/06/2024  Any of your usual work, housework or school activities 0  2. Usual hobbies, recreational or sporting activities 0  3. Getting into/out of the bath 0  4. Walking between rooms 1  5. Putting on socks/shoes  1  6. Squatting  0  7. Lifting an object, like a bag of groceries from the floor 3  8. Performing light activities around your home 3  9. Performing heavy activities around your home 0  10. Getting into/out of a car 0  11. Walking 2 blocks 0  12. Walking 1 mile 0  13. Going up/down 10 stairs (1 flight) 1  14. Standing for 1 hour 0  15.  sitting for 1 hour 2  16. Running on even ground 0  17. Running on uneven ground 0  18. Making sharp turns while running fast 0  19. Hopping  0  20. Rolling over in bed 2  Score total:  13/80     COGNITION: Overall cognitive status: Within functional limits for tasks assessed     SENSATION: WFL  POSTURE: rounded shoulders, forward head, increased lumbar lordosis, and large body habitus  PALPATION: TTP to distal R quad, patellar hypomobility  LOWER EXTREMITY  ROM:  Active ROM Right eval Left eval  Hip flexion    Hip extension    Hip abduction    Hip adduction    Hip internal rotation    Hip external rotation    Knee flexion 101 90  Knee extension 13 27  Ankle dorsiflexion    Ankle plantarflexion    Ankle inversion    Ankle eversion     (Blank rows = not tested)  LOWER EXTREMITY MMT:  MMT Right eval Left eval  Hip flexion 3+ 3+  Hip extension    Hip abduction 3+ 3+  Hip adduction    Hip internal rotation    Hip external rotation    Knee flexion 2+ 2+  Knee extension 2+ 2+  Ankle dorsiflexion    Ankle plantarflexion    Ankle inversion    Ankle eversion     (Blank rows = not tested)  LOWER EXTREMITY SPECIAL TESTS:  DNT  FUNCTIONAL TESTS:  30 Second Sit to Stand: 6 reps with UE  GAIT: Distance walked: 34ft Assistive device utilized: None Level of assistance: SBA Comments: antalgic gait bilaterally, decreased gait speed  TREATMENT: [facility] Adult PT Treatment:                                                DATE: 01/03/2024 Therapeutic Exercise: Seated QS x 5 - 5 hold Seated hamstring stretch x 30 each Seated LAQ x 5 each  PATIENT EDUCATION:  Education details: eval findings, LEFS, HEP, POC Person educated: Patient Education method: Explanation, Demonstration, and Handouts Education comprehension: verbalized understanding and returned demonstration  HOME EXERCISE PROGRAM: Access Code: [email or web address] URL: [email or web address] Date: 01/06/2024 Prepared by: Alm Kingdom  Exercises - Seated Quad Set  - 2-3 x daily - 7 x weekly - 2 sets - 10 reps - 5 sec hold - Seated Hamstring Stretch  - 2-3 x daily - 7 x weekly - 2-3 reps - 30 sec hold - Seated Long Arc Quad  - 2-3 x daily - 7 x weekly - 3 sets - 10 reps - 5 sec hold  ASSESSMENT:  CLINICAL IMPRESSION: Patient is a 58 y.o. F who was seen today for physical therapy evaluation and treatment for chronic bilateral knee pain. Pt has complex PMH and is  well known to therapist, with physical findings consistent with physician impression as pt demonstrates significant decrease in  bilateral knee strength and ROM. Functional mobility deficits observed in setting of 30 Second Sit to Stand and general transfer ability. LEFS score shows severe disability in performance of home ADLs and higher level community activities. Pt would benefit from skilled PT services working on improving LE strength and mobility in order to decrease pain and improve function.   OBJECTIVE IMPAIRMENTS: Abnormal gait, decreased activity tolerance, decreased balance, decreased endurance, decreased mobility, difficulty walking, decreased ROM, decreased strength, postural dysfunction, obesity, and pain  ACTIVITY LIMITATIONS: carrying, lifting, bending, sitting, standing, squatting, sleeping, stairs, transfers, and locomotion level  PARTICIPATION LIMITATIONS: meal prep, cleaning, driving, shopping, community activity, occupation, and yard work  PERSONAL FACTORS: Time since onset of injury/illness/exacerbation and 3+ comorbidities: DM II, Neuropathy, HTN, CHF are also affecting patient's functional outcome.   REHAB POTENTIAL: Fair - complicated by PMH and chronicity of condition  CLINICAL DECISION MAKING: Evolving/moderate complexity  EVALUATION COMPLEXITY: Moderate   GOALS: Goals reviewed with patient? No  SHORT TERM GOALS: Target date: 01/24/2024   Pt will be compliant and knowledgeable with initial HEP for improved comfort and carryover Baseline: initial HEP given  Goal status: INITIAL  2.  Pt will self report bilateral knee pain no greater than 8/10 for improved comfort and functional ability Baseline: 10/10 at worst Goal status: INITIAL   LONG TERM GOALS: Target date: 02/28/2024   Pt will improve LEFS to no less than 25/80 as proxy for functional improvement with home ADLs and higher level community activity Baseline: 13/80 Goal status: INITIAL  2.  Pt will self  report bilateral knee pain no greater than 5/10 for improved comfort and functional ability Baseline: 10/10 at worst Goal status: INITIAL   3.  Pt will increase 30 Second Sit to Stand rep count to no less than 9 reps for improved balance, strength, and functional mobility Baseline: 6 reps with UE Goal status: INITIAL   4.  Pt will improve bilateral knee ext to at least 5 degrees to full extension for improved comfort and functional mobility Baseline: see chart Goal status: INITIAL  5.  Pt will improve bilateral LE MMT to no less than 3+/5 for improved comfort and functional mobility Baseline:  see chart Goal status: INITIAL    PLAN:  PT FREQUENCY: 1-2x/week  PT DURATION: 8 weeks  PLANNED INTERVENTIONS: [id number]- PT Re-evaluation, 97110-Therapeutic exercises, 97530- Therapeutic activity, [id number]- Neuromuscular re-education, 97535- Self Care, [id number]- Manual therapy, [id number]- Gait training, [phone]- Aquatic Therapy, [phone]- Electrical stimulation (unattended), [phone]- Electrical stimulation (manual), 20560 (1-2 muscles), 20561 (3+ muscles)- Dry Needling, and Patient/Family education  PLAN FOR NEXT SESSION: assess HEP response, LE strengthening, knee ROM  For all possible CPT codes, reference the Planned Interventions line above.     Check all conditions that are expected to impact treatment: {Conditions expected to impact treatment:Respiratory disorders, Diabetes mellitus, and Musculoskeletal disorders   If treatment provided at initial evaluation, no treatment charged due to lack of authorization.       Alm JAYSON Kingdom, PT 01/06/2024, 9:20 AM

## 2024-01-06 ENCOUNTER — Other Ambulatory Visit: Payer: Self-pay

## 2024-01-06 ENCOUNTER — Inpatient Hospital Stay: Attending: Oncology | Admitting: Oncology

## 2024-01-06 ENCOUNTER — Encounter: Payer: Self-pay | Admitting: Oncology

## 2024-01-06 DIAGNOSIS — M170 Bilateral primary osteoarthritis of knee: Secondary | ICD-10-CM

## 2024-01-06 DIAGNOSIS — Z7901 Long term (current) use of anticoagulants: Secondary | ICD-10-CM | POA: Diagnosis not present

## 2024-01-06 DIAGNOSIS — I82402 Acute embolism and thrombosis of unspecified deep veins of left lower extremity: Secondary | ICD-10-CM

## 2024-01-06 DIAGNOSIS — Z86718 Personal history of other venous thrombosis and embolism: Secondary | ICD-10-CM

## 2024-01-06 MED ORDER — RIVAROXABAN 10 MG PO TABS: 10.0000 mg | ORAL_TABLET | Freq: Every day | ORAL | 5 refills | Status: DC

## 2024-01-06 NOTE — Progress Notes (Signed)
 [facility]  HEMATOLOGY-ONCOLOGY ELECTRONIC VISIT PROGRESS NOTE  PATIENT NAME: Rita Bass   MR#: [id number] DOB: 07/14/1965  DATE OF SERVICE: 01/06/2024  Patient Care Team: Rita Hamlet, MD as PCP - General (Family Medicine) Rita Bass ORN, MD as PCP - Cardiology (Cardiology)  I connected with the patient via telephone conference and verified that I am speaking with the correct person using two identifiers. The patient's location is at home and I am providing care from the [facility].  I discussed the limitations, risks, security and privacy concerns of performing an evaluation and management service by e-visits and the availability of in person appointments. I also discussed with the patient that there may be a patient responsible charge related to this service. The patient expressed understanding and agreed to proceed.   ASSESSMENT & PLAN:   Rita Bass is a 58 y.o.  lady with a past medical history of coronary artery disease, history of left lower extremity DVT, type 2 diabetes mellitus, chronic H. pylori gastritis, bilateral knee arthritis, was referred to our service for evaluation for her hx of LLE DVT in May 2025.    History of deep venous thrombosis (DVT) of distal vein of left lower extremity Left lower extremity DVT diagnosed in May 2025. Initial ultrasound showed a clot, which improved by June and was not visible by July 2025. However, the July ultrasound may not have been optimal due to significant pain and swelling during the examination. Currently on Xarelto  for anticoagulation. Further evaluation is needed to determine the current status of the DVT.  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  This could have increased risk of her DVT development.   Thrombophilia workup including factor V Leiden, prothrombin gene mutation, beta-2  glycoprotein antibodies, anticardiolipin antibodies, were all  negative on 11/25/2023.  Repeat ultrasound on 11/28/2023 showed no evidence of DVT.    Discussed option of stopping anticoagulation versus changing to prophylactic dose versus switching to aspirin  81 mg daily.  She does have cardiovascular and peripheral vascular disease and hence a decision was made to continue at least prophylactic dosing of Xarelto  at 10 mg daily.  Switched to this dosing from 01/06/2024  - Advise stopping Xarelto  3-5 days before any procedures and resuming the day after.  With these precautions, she is okay to undergo EGD and other workup that she may need, especially for her H. pylori management.     RTC in 3 months for follow-up with repeat labs.    I discussed the assessment and treatment plan with the patient. The patient was provided an opportunity to ask questions and all were answered. The patient agreed with the plan and demonstrated an understanding of the instructions. The patient was advised to call back or seek an in-person evaluation if the symptoms worsen or if the condition fails to improve as anticipated.    I spent 12 minutes over the phone with the patient reviewing test results, discuss management and coordination/planning of care.  Rita Patten, MD 01/06/2024 4:15 PM [facility] - A DEPT OF [facility] [address] [place] [address] Dept: [phone] Dept Fax: [phone]   INTERVAL HISTORY:  Please see above for problem oriented charting.  The purpose of today's discussion is to explain recent lab results and to formulate plan of care.  Discussed the use of AI scribe software for clinical note transcription with the patient, who  gave verbal consent to proceed.  History of Present Illness Rita Bass is a 58 year old female with a history of arterial blockage who presents for follow-up on blood clot risk and medication management.  She underwent a  comprehensive workup last month, which included blood tests and an ultrasound of the left leg. Her blood counts, including white count, red count, and platelets, were within normal limits. Her blood sugar was slightly elevated, and her calcium  level was also slightly high. The calcium  level will be rechecked at her next visit.  Tests for clotting risk, including genetic mutations and D-dimer, were negative. An ultrasound of her left leg performed on September 11th showed no evidence of clot formation.  She has a history of arterial blockage, which has previously necessitated the use of blood thinners. She has been on Xarelto , but has not taken it for the past five days due to pharmacy issues.   SUMMARY OF HEMATOLOGY HISTORY:   She was referred by her primary doctor for evaluation of her blood clotting issues.   She has a history of a blood clot in her left leg, first identified in May 2025, which caused significant swelling and pain, making it difficult for her to walk. Initially, she was unable to walk and required a wheelchair. She continues to experience swelling and pain in both legs, for which she wears compression socks. She takes Xarelto  once daily for anticoagulation.   She recently had stents placed in her heart two weeks ago due to blockages in two arteries, which were causing reduced blood flow. She experiences shortness of breath and increased heart rate upon exertion, which began in January 2025 during a hospitalization for RSV. She describes episodes of her heart racing over 200 beats per minute and shortness of breath, which have occurred during physical activity and at her primary doctor's office.   She has a history of a lung nodule, identified approximately two years ago, which has been monitored with CT scans. The nodule has not shown growth, and she has not been checked recently.   She was diagnosed with H. pylori infection in her stomach two years ago and has been treated with  antibiotics three times without success. Further evaluation was postponed due to the blood clot in her leg, as she was considered high risk for procedures.   She has a history of arthritis in her legs, which limits her mobility. She previously received injections and nerve blockers for pain management, but these were discontinued due to the blood clot. She reports swelling in her legs, which worsens with activity, and notes a blue ring around her legs after removing compression socks. No current chest pain or trouble breathing.   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 Initial ultrasound showed a clot, which improved by June and was not visible by July 2025. However, the July ultrasound may not have been optimal due to significant pain and swelling during the examination. Currently on Xarelto  for anticoagulation. Further evaluation is needed to determine the current status of the DVT.   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  This could have increased risk of her DVT development.   Thrombophilia workup including factor V Leiden, prothrombin gene mutation, beta-2  glycoprotein antibodies, anticardiolipin antibodies, were all negative on 11/25/2023.  Repeat ultrasound on 11/28/2023 showed no evidence of DVT.    Discussed option of stopping anticoagulation versus changing to prophylactic dose versus switching to aspirin  81 mg daily.  She does have cardiovascular and peripheral vascular disease and hence a decision was made to continue at least prophylactic dosing of Xarelto  at 10 mg daily.  Switched to this dosing from 01/06/2024  - Advise stopping Xarelto  3-5 days before any procedures and resuming the day after.  With these precautions, she is okay to undergo EGD and other workup that she may need, especially for her H. pylori management.    REVIEW OF SYSTEMS:    Review of Systems - Oncology  All other pertinent systems were reviewed with the patient  and are negative.  I have reviewed the past medical history, past surgical history, social history and family history with the patient and they are unchanged from previous note.  ALLERGIES:  She has no known allergies.  MEDICATIONS:  Current Outpatient Medications  Medication Sig Dispense Refill   Accu-Chek Softclix Lancets lancets USE IN THE MORNING, AT NOON AND AT BEDTIME 100 each 12   acetaminophen  (TYLENOL ) 650 MG CR tablet Take 650-1,300 mg by mouth every 8 (eight) hours as needed for pain. Last dose (2) around 0930 am.     albuterol  (PROVENTIL ) (5 MG/ML) 0.5% nebulizer solution Take 0.5 mLs (2.5 mg total) by nebulization every 6 (six) hours as needed for wheezing or shortness of breath. 20 mL 0   atorvastatin  (LIPITOR) 40 MG tablet Take 1 tablet (40 mg total) by mouth daily. 90 tablet 3   Blood Glucose Monitoring Suppl DEVI 1 each by Does not apply route in the morning, at noon, and at bedtime. May substitute to any manufacturer covered by patient's insurance. 1 each 0   colestipol (COLESTID) 1 g tablet TAKE 2 TABLETS(2 GRAMS) BY MOUTH TWICE DAILY 360 tablet 0   Continuous Glucose Receiver (FREESTYLE LIBRE 3 READER) DEVI 1 Device as directed.     Continuous Glucose Sensor (FREESTYLE LIBRE 3 PLUS SENSOR) MISC Change sensor every 15 days. 2 each 11   cyclobenzaprine  (FLEXERIL ) 10 MG tablet Take 1 tablet (10 mg total) by mouth 2 (two) times daily as needed for muscle spasms. 60 tablet 2   diclofenac  Sodium (VOLTAREN ) 1 % GEL Apply 2 g topically 4 (four) times daily. (Patient taking differently: Apply 2 g topically in the morning and at bedtime.) 100 g 0   DULoxetine  (CYMBALTA ) 60 MG capsule Take 1 capsule (60 mg total) by mouth daily. 30 capsule 3   empagliflozin  (JARDIANCE ) 25 MG TABS tablet Take 1 tablet (25 mg total) by mouth daily. 90 tablet 3   gabapentin  (NEURONTIN ) 100 MG capsule Take 6 capsules (600 mg total) by mouth 3 (three) times daily. 360 capsule 2   glucose blood (ACCU-CHEK  GUIDE) test strip 1 each by Other route daily. for testing 100 strip 1   hydrocortisone  (ANUSOL -HC) 2.5 % rectal cream Place 1 Application rectally 2 (two) times daily. 30 g 2   insulin  glargine (LANTUS ) 100 UNIT/ML Solostar Pen Inject 48 Units into the skin every morning. Inject 30 units subcutaneously every morning. Increase by 2 units each day until sugar < 150. Maximum daily dose 50 units.     Insulin  Pen Needle (PEN NEEDLES) 32G X 5 MM MISC Use as directed 30 each 3   metFORMIN  (GLUCOPHAGE -XR) 500 MG 24 hr tablet Take 2 tablets (1,000 mg total) by mouth 2 (two) times daily. 360 tablet 3   mometasone -formoterol  (DULERA ) 100-5 MCG/ACT AERO Inhale 2 puffs into the lungs daily. (Patient taking differently: Inhale 2 puffs into the lungs as needed. Patient reports using when she  has respiratory symptoms, typically in the Winter) 39 g 3   nystatin  (MYCOSTATIN /NYSTOP ) powder Apply 1 Application topically 2 (two) times daily. (Patient taking differently: Apply 1 Application topically 2 (two) times daily as needed (heat rash).) 60 g 5   olopatadine  (PATANOL) 0.1 % ophthalmic solution Place 1 drop into both eyes 2 (two) times daily. (Patient taking differently: Place 1 drop into both eyes 2 (two) times daily as needed for allergies.) 5 mL 12   pantoprazole  (PROTONIX ) 40 MG tablet Take 1 tablet (40 mg total) by mouth daily. Patient needs follow up appointment for future refills. Please call [phone] to schedule an appointment. (Patient taking differently: Take 40 mg by mouth daily as needed (reflux). Patient needs follow up appointment for future refills. Please call [phone] to schedule an appointment.) 90 tablet 0   rivaroxaban  (XARELTO ) 10 MG TABS tablet Take 1 tablet (10 mg total) by mouth daily with supper. 30 tablet 5   Semaglutide , 2 MG/DOSE, (OZEMPIC , 2 MG/DOSE,) 8 MG/3ML SOPN Inject 2 mg into the skin once a week. 3 mL 5   spironolactone  (ALDACTONE ) 25 MG tablet Take 1 tablet (25 mg total) by  mouth daily. 90 tablet 3   traMADol  (ULTRAM ) 50 MG tablet Take 1 tablet (50 mg total) by mouth every 12 (twelve) hours as needed. Not to be refilled before 30 days 60 tablet 0   traZODone  (DESYREL ) 50 MG tablet Take 1 tablet (50 mg total) by mouth at bedtime as needed for sleep. 30 tablet 3   No current facility-administered medications for this visit.    PHYSICAL EXAMINATION:  Not performed today as it was a phone only visit  LABORATORY DATA:   I have reviewed the data as listed.  Recent Results (from the past 2160 hours)  Basic metabolic panel with GFR     Status: Abnormal   Collection Time: 10/30/23  9:10 AM  Result Value Ref Range   Glucose 227 (H) 70 - 99 mg/dL   BUN 8 6 - 24 mg/dL   Creatinine, Ser 9.42 0.57 - 1.00 mg/dL   eGFR 892 >40 fO/fpw/8.26   BUN/Creatinine Ratio 14 9 - 23   Sodium 139 134 - 144 mmol/L   Potassium 4.3 3.5 - 5.2 mmol/L   Chloride 99 96 - 106 mmol/L   CO2 22 20 - 29 mmol/L   Calcium  9.5 8.7 - 10.2 mg/dL  CBC     Status: Abnormal   Collection Time: 10/30/23  9:10 AM  Result Value Ref Range   WBC 7.6 3.4 - 10.8 x10E3/uL   RBC 5.79 (H) 3.77 - 5.28 x10E6/uL   Hemoglobin 13.6 11.1 - 15.9 g/dL   Hematocrit 55.9 65.9 - 46.6 %   MCV 76 (L) 79 - 97 fL   MCH 23.5 (L) 26.6 - 33.0 pg   MCHC 30.9 (L) 31.5 - 35.7 g/dL   RDW 84.1 (H) 88.2 - 84.5 %   Platelets 300 150 - 450 x10E3/uL  HgB A1c     Status: Abnormal   Collection Time: 11/04/23  9:15 AM  Result Value Ref Range   Hemoglobin A1C     HbA1c POC (<> result, manual entry)     HbA1c, POC (prediabetic range)     HbA1c, POC (controlled diabetic range) 10.5 (A) 0.0 - 7.0 %  Glucose, capillary     Status: Abnormal   Collection Time: 11/05/23 12:11 PM  Result Value Ref Range   Glucose-Capillary 242 (H) 70 - 99 mg/dL  Comment: Glucose reference range applies only to samples taken after fasting for at least 8 hours.   Comment 1 Notify RN    Comment 2 Document in Chart   Glucose, capillary     Status:  Abnormal   Collection Time: 11/05/23  4:34 PM  Result Value Ref Range   Glucose-Capillary 211 (H) 70 - 99 mg/dL    Comment: Glucose reference range applies only to samples taken after fasting for at least 8 hours.  Cardiolipin antibodies, IgG, IgM, IgA     Status: None   Collection Time: 11/25/23 11:42 AM  Result Value Ref Range   Anticardiolipin IgG <9 0 - 14 GPL U/mL    Comment: (NOTE)                          Negative:              <15                          Indeterminate:     15 - 20                          Low-Med Positive: >20 - 80                          High Positive:         >80    Anticardiolipin IgM <9 0 - 12 MPL U/mL    Comment: (NOTE)                          Negative:              <13                          Indeterminate:     13 - 20                          Low-Med Positive: >20 - 80                          High Positive:         >80    Anticardiolipin IgA <9 0 - 11 APL U/mL    Comment: (NOTE)                          Negative:              <12                          Indeterminate:     12 - 20                          Low-Med Positive: >20 - 80                          High Positive:         >80 Performed At: [facility] [address] [place], [place] [id number] Jennette Shorter MD Ey:[phone]   Beta-2 -glycoprotein i abs, IgG/M/A     Status: None   Collection  Time: 11/25/23 11:42 AM  Result Value Ref Range   Beta-2  Glyco I IgG <9 0 - 20 GPI IgG units   Beta-2 -Glycoprotein I IgM <9 0 - 32 GPI IgM units    Comment: (NOTE) Performed At: [facility]  [address] [place], [place] [id number] Jennette Shorter MD Ey:[phone]    Beta-2 -Glycoprotein I IgA <9 0 - 25 GPI IgA units  Prothrombin gene mutation     Status: None   Collection Time: 11/25/23 11:42 AM  Result Value Ref Range   Recommendations-PTGENE: Comment     Comment: (NOTE) Result: c.*97G>A - Not Detected This result is not associated with an increased risk for  venous thromboembolism. See Additional Clinical Information and Comments. Additional Clinical Information: Venous thromboembolism is a multifactorial disease influenced by genetic, environmental, and circumstantial risk factors. The c.*97G>A variant in the F2 gene is a genetic risk factor for venous thromboembolism. Heterozygous carriers have a 2- to 4-fold increased risk for venous thromboembolism. Homozygotes for the c.*97G>A variant are rare. The annual risk of VTE in homozygotes has been reported to be 1.1%/year. Individuals who carry both a c.*97G>A variant in the F2 gene and a c.1601G>A (p. Arg534Gln) variant in the F5 gene (commonly referred to as Factor V Leiden) have an approximately 20- fold increased risk for venous thromboembolism. Risks are likely to be even higher in more complex genotype combinations involving the F2 c.*97G>A variant and Factor V Leiden (PMID:  66325232). Additional risk factors include but are not limited to: deficiency of protein C, protein S, or antithrombin III, age, female sex, personal or family history of deep vein thromboembolism, smoking, surgery, prolonged immobilization, malignant neoplasm, tamoxifen treatment, raloxifene treatment, oral contraceptive use, hormone replacement therapy, and pregnancy. Management of thrombotic risk and thrombotic events should follow established guidelines and fit the clinical circumstance. This result cannot predict the occurrence or recurrence of a thrombotic event. Comments: Genetic counseling is recommended to discuss the potential clinical implications of positive results, as well as recommendations for testing family members. Genetic Coordinators are available for health care providers to discuss results at [phone]). Test Details: Variant analyzed: c.*97G>A, previously referred to as G20210A Methods/Limitations: DNA analysis of the F2 gene (NM_000 506.5) was performed by PCR amplification  followed by restriction enzyme analysis. The diagnostic sensitivity is >99%. Results must be combined with clinical information for the most accurate interpretation. Molecular-based testing is highly accurate, but as in any laboratory test, diagnostic errors may occur. False positive or false negative results may occur for reasons that include genetic variants, blood transfusions, bone marrow transplantation, somatic or tissue-specific mosaicism, mislabeled samples, or erroneous representation of family relationships. This test was developed and its performance characteristics determined by [facility]. It has not been cleared or approved by the Food and Drug Administration. References: Bhatt S, Taylor AK, Lozano R, Grody [facility], [facility]; ACMG Professional Practice and Guidelines Committee. Addendum: [employer] of [employer] Genetics consensus statement on factor V Leiden mutation testing. Genet Med. 2021 Mar 5. doi: 89.8961/d585 [id number]-x. PMID: 66325232. Hosey RUSH. Prothrombin Thrombophilia. 2006 Jul 25 [Updated 2021 Feb 4]. In: Juliene POSNER, Ardinger HH, Pagon RA, et al., editors. GeneReviews(R) [Internet]. [address] [facility]): [place] , [place]; 8006-7978. Available from: [email or web address].com/ Laurita GORMAN Waddell BRIDGETT, Huang X, Luo B, Spector EB, Ileana SHAUNNA Gal CS; ACMG Laboratory Quality Assurance Committee. Venous thromboembolism laboratory testing (factor V Leiden and factor II c.*97G>A), 2018 update: a technical standard of the [employer] of [employer] and Genomics (ACMG). Genet Med. 2018 Dec;20(12):1489-1498.  doi: 10.1038/[id number]-z. Epub 2018 Oct 5. PMID: 69702301.    Reviewed by: Comment     Comment: (NOTE) Technical Component performed at [employer] [facility] Professional Component performed by: Lelon Dull, PhD, [facility] BPTGD[facility], [facility], [address] [place] [address] Performed At: [facility] [address] [place],  [place] [id number] Loran Gales MDPhD Ey:[id number]   Factor 5 leiden     Status: None   Collection Time: 11/25/23 11:42 AM  Result Value Ref Range   Recommendations-F5LEID: Comment     Comment: (NOTE) Result: c.1601G>A (p.Arg534Gln) - Not Detected This result is not associated with an increased risk for venous thromboembolism. See Additional Clinical Information and Comments. Additional Clinical Information:    Venous thromboembolism is a multifactorial disease influenced by genetic, environmental, and circumstantial risk factors. The c.1601G>A (p. Arg534Gln) variant in the F5 gene, commonly referred to as Factor V Leiden, is a genetic risk factor for venous thromboembolism. Heterozygous carriers of this variant have a 6- to 8- fold increased risk for venous thromboembolism. Individuals homozygous for this variant (ie, with a copy of the variant on each chromosome) have an approximately 80-fold increased risk for venous thromboembolism. Individuals who carry both a c.*97G>A variant in the F2 gene and Factor V Leiden have an approximately 20-fold increased risk for venous thromboembolism. Risks are likely to be even higher in more complex genotype combinations  involving the F2 c.*97G>A variant and Factor V Leiden (PMID: 66325232). Additional risk factors include but are not limited to: deficiency of protein C, protein S, or antithrombin III, age, female sex, personal or family history of deep vein thromboembolism, smoking, surgery, prolonged immobilization, malignant neoplasm, tamoxifen treatment, raloxifene treatment, oral contraceptive use, hormone replacement therapy, and pregnancy. Management of thrombotic risk and thrombotic events should follow established guidelines and fit the clinical circumstance. This result cannot predict the occurrence or recurrence of a thrombotic event. Comment:    Genetic counseling is recommended to discuss the potential clinical implications of positive  results, as well as recommendations for testing family members.    Genetic Coordinators are available for health care providers to discuss results at [phone]). Test Details:    Variant Analyzed: c.1601G>A (p. Arg534Gln), referre d to as Factor V Leiden Methods/Limitations:    DNA analysis of the F5 gene (NM_000130.5) was performed by PCR amplification followed by electrophoresis. The diagnostic sensitivity is >99%. Results must be combined with clinical information for the most accurate interpretation. Molecular-based testing is highly accurate, but as in any laboratory test, diagnostic errors may occur. False positive or false negative results may occur for reasons that include genetic variants, blood transfusions, bone marrow transplantation, somatic or tissue-specific mosaicism, mislabeled samples, or erroneous representation of family relationships.    This test was developed and its performance characteristics determined by [facility]. It has not been cleared or approved by the Food and Drug Administration. References:    Bhatt S, Taylor AK, Lozano R, Grody [facility], [facility]; ACMG Professional Practice and Guidelines Committee. Addendum: [employer] of Medical Genetics consensus statemen t on factor V Leiden mutation testing. Genet Med. 2021 Mar 5. doi: 89.8961/d58563-978- 01108-x. PMID: 66325232.    Hosey RUSH. Factor V Leiden Thrombophilia. 1999 May 14 (Updated 2018 Jan 4). In: Juliene POSNER, Ardinger HH, Pagon RA, et al., editors. GeneReviews(R) (Internet). [address] ([place]): [place] , [place]; 8006-7978. Available from: [email or web address]-mcgee.org/    Laurita GORMAN Waddell BRIDGETT, Huang X, Luo B, Spector EB, Ileana SHAUNNA Gal CS; ACMG Laboratory Quality Assurance Committee. Venous thromboembolism  laboratory testing (factor V Leiden and factor II c. *97G>A), 2018 update: a technical standard of the [employer] of [employer] and [employer]  (ACMG). Genet Med. 2018 Izr;79(87): 8510-8501. doi: 10.1038/[id number]-z. Epub 2018 Oct 5. PMID: 69702301.    Reviewed By: Comment     Comment: (NOTE) Andree FABIENE Lex, Ph.D., [facility] Performed At: [facility] [address] [place], [place] [id number] Loran Gales MDPhD Ey:[id number]   D-dimer, quantitative     Status: None   Collection Time: 11/25/23 11:42 AM  Result Value Ref Range   D-Dimer, Quant <0.27 0.00 - 0.50 ug/mL-FEU    Comment: (NOTE) At the manufacturer cut-off value of 0.5 g/mL FEU, this assay has a negative predictive value of 95-100%.This assay is intended for use in conjunction with a clinical pretest probability (PTP) assessment model to exclude pulmonary embolism (PE) and deep venous thrombosis (DVT) in outpatients suspected of PE or DVT. Results should be correlated with clinical presentation. Performed at [facility], [address] [place], [place] [address]   CMP (Cancer Center only)     Status: Abnormal   Collection Time: 11/25/23 11:42 AM  Result Value Ref Range   Sodium 137 135 - 145 mmol/L   Potassium 4.2 3.5 - 5.1 mmol/L   Chloride 96 (L) 98 - 111 mmol/L   CO2 27 22 - 32 mmol/L   Glucose, Bld 283 (H) 70 - 99 mg/dL    Comment: Glucose reference range applies only to samples taken after fasting for at least 8 hours.   BUN 10 6 - 20 mg/dL   Creatinine 9.30 9.55 - 1.00 mg/dL   Calcium  10.7 (H) 8.9 - 10.3 mg/dL   Total Protein 8.3 (H) 6.5 - 8.1 g/dL   Albumin 4.4 3.5 - 5.0 g/dL   AST 15 15 - 41 U/L   ALT 18 0 - 44 U/L   Alkaline Phosphatase 114 38 - 126 U/L   Total Bilirubin 0.5 0.0 - 1.2 mg/dL   GFR, Estimated >39 >39 mL/min    Comment: (NOTE) Calculated using the CKD-EPI Creatinine Equation (2021)    Anion gap 14 5 - 15    Comment: Performed at [facility], [address], [place], [place] [address]  CBC with Differential (Cancer Center Only)     Status: Abnormal   Collection Time: 11/25/23  11:42 AM  Result Value Ref Range   WBC Count 7.8 4.0 - 10.5 K/uL   RBC 5.88 (H) 3.87 - 5.11 MIL/uL   Hemoglobin 13.9 12.0 - 15.0 g/dL   HCT 56.6 63.9 - 53.9 %   MCV 73.6 (L) 80.0 - 100.0 fL   MCH 23.6 (L) 26.0 - 34.0 pg   MCHC 32.1 30.0 - 36.0 g/dL   RDW 82.7 (H) 88.4 - 84.4 %   Platelet Count 289 150 - 400 K/uL   nRBC 0.0 0.0 - 0.2 %   Neutrophils Relative % 62 %   Neutro Abs 4.8 1.7 - 7.7 K/uL   Lymphocytes Relative 27 %   Lymphs Abs 2.1 0.7 - 4.0 K/uL   Monocytes Relative 7 %   Monocytes Absolute 0.5 0.1 - 1.0 K/uL   Eosinophils Relative 4 %   Eosinophils Absolute 0.3 0.0 - 0.5 K/uL   Basophils Relative 0 %   Basophils Absolute 0.0 0.0 - 0.1 K/uL   Immature Granulocytes 0 %   Abs Immature Granulocytes 0.03 0.00 - 0.07 K/uL    Comment: Performed at [facility], [phone], [place], [place] [address]  Urine Culture     Status: Abnormal   Collection Time: 12/16/23 10:33 AM   Specimen: Urine, Clean Catch  Result Value Ref Range   Specimen Description URINE, CLEAN CATCH    Special Requests NONE    Culture (A)     >=100,000 COLONIES/mL ESCHERICHIA COLI 50,000 COLONIES/mL GROUP B STREP(S.AGALACTIAE)ISOLATED TESTING AGAINST S. AGALACTIAE NOT ROUTINELY PERFORMED DUE TO PREDICTABILITY OF AMP/PEN/VAN SUSCEPTIBILITY. Performed at [facility], [address] [place], [place] [address]    Report Status 12/18/2023 FINAL    Organism ID, Bacteria ESCHERICHIA COLI (A)       Susceptibility   Escherichia coli - MIC*    AMPICILLIN <=2 SENSITIVE Sensitive     CEFAZOLIN  (URINE) Value in next row Sensitive      2 SENSITIVEThis is a modified FDA-approved test that has been validated and its performance characteristics determined by the reporting laboratory.  This laboratory is certified under the Clinical Laboratory Improvement Amendments CLIA as qualified to perform high complexity clinical laboratory testing.    CEFEPIME Value in next row Sensitive      2  SENSITIVEThis is a modified FDA-approved test that has been validated and its performance characteristics determined by the reporting laboratory.  This laboratory is certified under the Clinical Laboratory Improvement Amendments CLIA as qualified to perform high complexity clinical laboratory testing.    ERTAPENEM Value in next row Sensitive      2 SENSITIVEThis is a modified FDA-approved test that has been validated and its performance characteristics determined by the reporting laboratory.  This laboratory is certified under the Clinical Laboratory Improvement Amendments CLIA as qualified to perform high complexity clinical laboratory testing.    CEFTRIAXONE Value in next row Sensitive      2 SENSITIVEThis is a modified FDA-approved test that has been validated and its performance characteristics determined by the reporting laboratory.  This laboratory is certified under the Clinical Laboratory Improvement Amendments CLIA as qualified to perform high complexity clinical laboratory testing.    CIPROFLOXACIN Value in next row Sensitive      2 SENSITIVEThis is a modified FDA-approved test that has been validated and its performance characteristics determined by the reporting laboratory.  This laboratory is certified under the Clinical Laboratory Improvement Amendments CLIA as qualified to perform high complexity clinical laboratory testing.    GENTAMICIN Value in next row Sensitive      2 SENSITIVEThis is a modified FDA-approved test that has been validated and its performance characteristics determined by the reporting laboratory.  This laboratory is certified under the Clinical Laboratory Improvement Amendments CLIA as qualified to perform high complexity clinical laboratory testing.    NITROFURANTOIN Value in next row Sensitive      2 SENSITIVEThis is a modified FDA-approved test that has been validated and its performance characteristics determined by the reporting laboratory.  This laboratory is  certified under the Clinical Laboratory Improvement Amendments CLIA as qualified to perform high complexity clinical laboratory testing.    TRIMETH /SULFA  Value in next row Sensitive      2 SENSITIVEThis is a modified FDA-approved test that has been validated and its performance characteristics determined by the reporting laboratory.  This laboratory is certified under the Clinical Laboratory Improvement Amendments CLIA as qualified to perform high complexity clinical laboratory testing.    AMPICILLIN/SULBACTAM Value in next row Sensitive      2 SENSITIVEThis is a modified FDA-approved test that has been validated and its performance characteristics determined by the  reporting laboratory.  This laboratory is certified under the Clinical Laboratory Improvement Amendments CLIA as qualified to perform high complexity clinical laboratory testing.    PIP/TAZO Value in next row Sensitive      <=4 SENSITIVEThis is a modified FDA-approved test that has been validated and its performance characteristics determined by the reporting laboratory.  This laboratory is certified under the Clinical Laboratory Improvement Amendments CLIA as qualified to perform high complexity clinical laboratory testing.    MEROPENEM Value in next row Sensitive      <=4 SENSITIVEThis is a modified FDA-approved test that has been validated and its performance characteristics determined by the reporting laboratory.  This laboratory is certified under the Clinical Laboratory Improvement Amendments CLIA as qualified to perform high complexity clinical laboratory testing.    * >=100,000 COLONIES/mL ESCHERICHIA COLI  POC urinalysis dipstick     Status: Abnormal   Collection Time: 12/16/23 10:36 AM  Result Value Ref Range   Color, UA red (A) yellow   Clarity, UA cloudy (A) clear   Glucose, UA =500 (A) negative mg/dL   Bilirubin, UA moderate (A) negative   Ketones, POC UA small (15) (A) negative mg/dL   Spec Grav, UA 8.989 8.989 - 1.025    Blood, UA trace-lysed (A) negative   pH, UA 5.0 5.0 - 8.0   Protein Ur, POC >=300 (A) negative mg/dL   Urobilinogen, UA >=1.9 (A) 0.2 or 1.0 E.U./dL   Nitrite, UA Positive (A) Negative   Leukocytes, UA Large (3+) (A) Negative     RADIOGRAPHIC STUDIES:  I have personally reviewed the radiological images as listed and agree with the findings in the report.  DG Abd 1 View CLINICAL DATA:  Abdominal pain.  Diarrhea.  EXAM: ABDOMEN - 1 VIEW  COMPARISON:  CT 01/11/2022  FINDINGS: Moderate stool within the hepatic flexure of the colon, small volume of stool in the transverse and sigmoid colon. No small bowel distension or evidence of obstruction. No visible radiopaque calculi. Cholecystectomy clips in the right upper quadrant. The lung bases are clear. Degenerative change in the spine.  IMPRESSION: Moderate stool in the hepatic flexure of the colon, small volume of stool in the transverse and sigmoid colon. No bowel obstruction.  Electronically Signed   By: Andrea Gasman M.D.   On: 12/29/2023 15:38   Orders Placed This Encounter  Procedures   CBC with Differential (Cancer Center Only)    Standing Status:   Future    Expected Date:   02/24/2024    Expiration Date:   05/24/2024   D-dimer, quantitative    Standing Status:   Future    Expected Date:   02/24/2024    Expiration Date:   05/24/2024     Future Appointments  Date Time Provider Department Center  01/07/2024 11:00 AM Amalia Maude MATSU, RPH-CPP [facility]  01/17/2024  8:45 AM Carita Harlene HERO, PTA [facility]  01/21/2024 11:00 AM Carita Harlene HERO JOSETTA [facility]  01/23/2024 11:45 AM Johna Bass BROCKS, PT [facility]  01/27/2024 11:00 AM Rosann Washington Greener, PT [facility]  01/29/2024  8:40 AM Rita Bass ORN, MD [facility]  01/30/2024 10:15 AM Carita Harlene HERO, PTA [facility]  02/03/2024 11:00 AM Rosann Washington Greener, PT [facility]  02/05/2024 11:45 AM Rosann Washington Greener, PT [facility]   02/24/2024 11:00 AM [facility] PHLEBOTOMIST [facility] None  02/24/2024 11:30 AM Muskaan Smet, Chinita, MD [facility] None    This document was completed utilizing speech recognition software. Grammatical errors,  random word insertions, pronoun errors, and incomplete sentences are an occasional consequence of this system due to software limitations, ambient noise, and hardware issues. Any formal questions or concerns about the content, text or information contained within the body of this dictation should be directly addressed to the provider for clarification.

## 2024-01-06 NOTE — Assessment & Plan Note (Signed)
 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 Initial ultrasound showed a clot, which improved by June and was not visible by July 2025. However, the July ultrasound may not have been optimal due to significant pain and swelling during the examination. Currently on Xarelto  for anticoagulation. Further evaluation is needed to determine the current status of the DVT.  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  This could have increased risk of her DVT development.   Thrombophilia workup including factor V Leiden, prothrombin gene mutation, beta-2  glycoprotein antibodies, anticardiolipin antibodies, were all negative on 11/25/2023.  Repeat ultrasound on 11/28/2023 showed no evidence of DVT.    Discussed option of stopping anticoagulation versus changing to prophylactic dose versus switching to aspirin  81 mg daily.  She does have cardiovascular and peripheral vascular disease and hence a decision was made to continue at least prophylactic dosing of Xarelto  at 10 mg daily.  Switched to this dosing from 01/06/2024  - Advise stopping Xarelto  3-5 days before any procedures and resuming the day after.  With these precautions, she is okay to undergo EGD and other workup that she may need, especially for her H. pylori management.     RTC in 3 months for follow-up with repeat labs.

## 2024-01-07 ENCOUNTER — Ambulatory Visit: Admitting: Pharmacist

## 2024-01-08 ENCOUNTER — Telehealth: Payer: Self-pay

## 2024-01-08 ENCOUNTER — Other Ambulatory Visit: Payer: Self-pay

## 2024-01-08 DIAGNOSIS — Z6841 Body Mass Index (BMI) 40.0 and over, adult: Secondary | ICD-10-CM

## 2024-01-08 DIAGNOSIS — I2510 Atherosclerotic heart disease of native coronary artery without angina pectoris: Secondary | ICD-10-CM

## 2024-01-08 DIAGNOSIS — R197 Diarrhea, unspecified: Secondary | ICD-10-CM

## 2024-01-08 DIAGNOSIS — B9681 Helicobacter pylori [H. pylori] as the cause of diseases classified elsewhere: Secondary | ICD-10-CM

## 2024-01-08 DIAGNOSIS — R1013 Epigastric pain: Secondary | ICD-10-CM

## 2024-01-08 NOTE — Telephone Encounter (Signed)
 Daughter answered and asked if I could call back later around 4pm.

## 2024-01-08 NOTE — Telephone Encounter (Signed)
 Demita has discussed recommendations with patient. See alternate telephone note for details.

## 2024-01-08 NOTE — Telephone Encounter (Signed)
 I spoke to [place] and I advised her of her xray results and Amanda's recommendations.  She asked about her H-pylori results and I advised her that they were negative.  I also explained to her that Dr. San recommended that she have the EGD at the hospital to evaluate appropriate ulcer healing.  Patient agreed with both providers plan of care.  She stated that she cannot access her MyChart and would like all instructions mailed to her.  I told her that I will do that for her.

## 2024-01-09 ENCOUNTER — Telehealth: Payer: Self-pay

## 2024-01-09 NOTE — Telephone Encounter (Signed)
 Patient calls nurse line due to concerns with Xarelto  prescription.   She reports that Dr. Pasam decreased dosage from 20 mg to 10 mg.   She wanted to make sure that this was okay with Dr. Elicia due to her history of DVT.   Forwarding message to PCP.   Rita Bass English, RN

## 2024-01-10 NOTE — Telephone Encounter (Signed)
 Entry error

## 2024-01-13 ENCOUNTER — Encounter: Payer: Self-pay | Admitting: Physician Assistant

## 2024-01-17 ENCOUNTER — Ambulatory Visit: Admitting: Pharmacist

## 2024-01-17 ENCOUNTER — Ambulatory Visit: Admitting: Physical Therapy

## 2024-01-21 ENCOUNTER — Ambulatory Visit: Attending: Family Medicine | Admitting: Physical Therapy

## 2024-01-21 ENCOUNTER — Encounter: Payer: Self-pay | Admitting: Physical Therapy

## 2024-01-21 DIAGNOSIS — G8929 Other chronic pain: Secondary | ICD-10-CM | POA: Diagnosis not present

## 2024-01-21 DIAGNOSIS — M25562 Pain in left knee: Secondary | ICD-10-CM | POA: Diagnosis not present

## 2024-01-21 DIAGNOSIS — M6281 Muscle weakness (generalized): Secondary | ICD-10-CM | POA: Insufficient documentation

## 2024-01-21 DIAGNOSIS — M25561 Pain in right knee: Secondary | ICD-10-CM | POA: Insufficient documentation

## 2024-01-21 DIAGNOSIS — R2689 Other abnormalities of gait and mobility: Secondary | ICD-10-CM | POA: Insufficient documentation

## 2024-01-21 NOTE — Therapy (Signed)
 OUTPATIENT PHYSICAL THERAPY LOWER EXTREMITY TREATMENT   Patient Name: Rita Bass MRN: [id number] DOB:[birth year], 58 y.o., female Today's Date: 01/21/2024  END OF SESSION:  PT End of Session - 01/21/24 1113     Visit Number 2    Number of Visits 17    Date for Recertification  02/28/24    Authorization Type [employer]    Authorization Time Period 01/03/24-03/02/24    Authorization - Visit Number 2    Authorization - Number of Visits 6    PT Start Time 1110    PT Stop Time 1148    PT Time Calculation (min) 38 min          Past Medical History:  Diagnosis Date   Arthritis    Asthma    Chronic pain of left knee    Complication of anesthesia    PONV   Diabetes (HCC)    GERD (gastroesophageal reflux disease)    H/O bronchitis    Hepatic steatosis    Internal hemorrhoids    Kidney stones    Obesity    Renal disorder    Shortness of breath dyspnea    Sleep apnea    past issues - at weight over 400lbs   Sleep apnea in adult    Polysomnogram pending.  Followed by Dr. Shellia   Past Surgical History:  Procedure Laterality Date   BIOPSY  11/01/2020   Procedure: BIOPSY;  Surgeon: San Sandor GAILS, DO;  Location: [facility];  Service: Gastroenterology;;   BIOPSY  05/11/2021   Procedure: BIOPSY;  Surgeon: San Sandor GAILS, DO;  Location: [facility];  Service: Gastroenterology;;   BIOPSY  11/29/2022   Procedure: BIOPSY;  Surgeon: San Sandor GAILS, DO;  Location: [facility];  Service: Gastroenterology;;   CESAREAN SECTION     6 c-sections   CHOLECYSTECTOMY     COLONOSCOPY WITH PROPOFOL  N/A 05/11/2021   Procedure: COLONOSCOPY WITH PROPOFOL ;  Surgeon: San Sandor GAILS, DO;  Location: [facility];  Service: Gastroenterology;  Laterality: N/A;   ESOPHAGOGASTRODUODENOSCOPY (EGD) WITH PROPOFOL  N/A 11/01/2020   Procedure: ESOPHAGOGASTRODUODENOSCOPY (EGD) WITH PROPOFOL ;  Surgeon: San Sandor GAILS, DO;  Location: [facility];  Service: Gastroenterology;   Laterality: N/A;   ESOPHAGOGASTRODUODENOSCOPY (EGD) WITH PROPOFOL  N/A 11/29/2022   Procedure: ESOPHAGOGASTRODUODENOSCOPY (EGD) WITH PROPOFOL ;  Surgeon: San Sandor GAILS, DO;  Location: [facility];  Service: Gastroenterology;  Laterality: N/A;   HERNIA REPAIR     KIDNEY STONE SURGERY     LEFT HEART CATH AND CORONARY ANGIOGRAPHY N/A 11/05/2023   Procedure: LEFT HEART CATH AND CORONARY ANGIOGRAPHY;  Surgeon: Anner Alm ORN, MD;  Location: [facility];  Service: Cardiovascular;  Laterality: N/A;   OOPHORECTOMY     TRANSTHORACIC ECHOCARDIOGRAM  09/2017   Technically difficult study.  Did not use Definity  contrast.  EF was 55 to 60% with moderate LVH and grade 1 diastolic dysfunction.  No significant valvular lesions noted.  No regional wall motion normality but difficult to assess due to poor imaging.    TUBAL LIGATION     UPPER GASTROINTESTINAL ENDOSCOPY     Patient Active Problem List   Diagnosis Date Noted   History of deep venous thrombosis (DVT) of distal vein of left lower extremity 11/25/2023   Palpitations 06/27/2023   Coronary artery disease involving native coronary artery of native heart without angina pectoris 06/27/2023   Sleep disturbance 06/20/2023   Shortness of breath 03/18/2023   Chronic abdominal pain 12/24/2022   Medication management 12/21/2022  Chronic Helicobacter pylori gastritis 11/29/2022   Gastric ulcer without hemorrhage or perforation 11/29/2022   Gastroesophageal reflux disease with esophagitis without hemorrhage 11/29/2022   Change in bowel habits 11/29/2022   Diarrhea 11/29/2022   Asthma 10/22/2022   Primary osteoarthritis of both knees 10/03/2022   Type 2 diabetes mellitus with diabetic neuropathy, unspecified (HCC) 04/30/2022   Chronic osteoarthritis 04/30/2022   Rash 01/15/2022   External hemorrhoid 09/15/2021   Ventral hernia 08/16/2021   Allergic conjunctivitis 07/28/2021   Grade II internal hemorrhoids    H. 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   Diabetic nephropathy associated with type 2 diabetes mellitus (HCC) 10/25/2017   Type 2 diabetes mellitus with complication (HCC) 09/13/2017   Sleep apnea 08/16/2017   Chronic pain of right knee 07/13/2016   Morbid obesity (HCC) 02/04/2015   Menorrhagia with regular cycle 11/18/2014    PCP: [name], MD  REFERRING PROVIDER: McDiarmid, Krystal BIRCH, MD   REFERRING DIAG: M17.0 (ICD-10-CM) - Primary osteoarthritis of both knees   THERAPY DIAG:  Chronic pain of right knee  Chronic pain of left knee  Rationale for Evaluation and Treatment: Rehabilitation  ONSET DATE: Chronic  SUBJECTIVE:   SUBJECTIVE STATEMENT: Pt reports she has been having more shocking like pain in her left posterior thigh and calf.   EVAL: Pt presents to PT with reports of chronic bilateral knee pain with recent R>L. Pt is well known to PT, has complex PMH in setting of cardiovascular system. Recent concern for L DVT, awaiting results but they sent her home from ED on 12/16/2023. Also waiting until stable to address [facility] Pylori at [facility] in a few months. Lateral knee pain bilaterally, pain with transfers and overall mobility.   PERTINENT HISTORY: DM II, Neuropathy, HTN, CHF  PAIN:  Are you having pain?  Yes: NPRS scale: 7/10 Worst: 10/10 Pain location: bilateral knees Pain description: sharp, sore Aggravating factors:  standing, transfers, walking Relieving factors: rest, medication  PRECAUTIONS: Fall  RED FLAGS: None   WEIGHT BEARING RESTRICTIONS: No  FALLS:  Has patient fallen in last 6 months? Yes. Number of falls numerous - no falls to floor but has fallen backwards on bed or chair with transfers  LIVING ENVIRONMENT: Lives with: lives with their daughter Lives in: House/apartment Stairs: Yes: External: 3 steps; on right going up Has following equipment at home: None  OCCUPATION: Not working  PLOF: Independent with basic ADLs  PATIENT GOALS: decrease knee pain, be able to move better  OBJECTIVE:  Note: Objective measures were completed at Evaluation unless otherwise noted.  DIAGNOSTIC FINDINGS: see imaging   PATIENT SURVEYS:  LEFS  Extreme difficulty/unable (0), Quite a bit of difficulty (1), Moderate difficulty (2), Little difficulty (3), No difficulty (4) Survey date:  01/06/2024  Any of your usual work, housework or school activities 0  2. Usual hobbies, recreational or sporting activities 0  3. Getting into/out of the bath 0  4.  Walking between rooms 1  5. Putting on socks/shoes 1  6. Squatting  0  7. Lifting an object, like a bag of groceries from the floor 3  8. Performing light activities around your home 3  9. Performing heavy activities around your home 0  10. Getting into/out of a car 0  11. Walking 2 blocks 0  12. Walking 1 mile 0  13. Going up/down 10 stairs (1 flight) 1  14. Standing for 1 hour 0  15.  sitting for 1 hour 2  16. Running on even ground 0  17. Running on uneven ground 0  18. Making sharp turns while running fast 0  19. Hopping  0  20. Rolling over in bed 2  Score total:  13/80     COGNITION: Overall cognitive status: Within functional limits for tasks assessed     SENSATION: WFL  POSTURE: rounded shoulders, forward head, increased lumbar lordosis, and large body habitus  PALPATION: TTP to distal R quad, patellar hypomobility  LOWER  EXTREMITY ROM:  Active ROM Right eval Left eval  Hip flexion    Hip extension    Hip abduction    Hip adduction    Hip internal rotation    Hip external rotation    Knee flexion 101 90  Knee extension 13 27  Ankle dorsiflexion    Ankle plantarflexion    Ankle inversion    Ankle eversion     (Blank rows = not tested)  LOWER EXTREMITY MMT:  MMT Right eval Left eval  Hip flexion 3+ 3+  Hip extension    Hip abduction 3+ 3+  Hip adduction    Hip internal rotation    Hip external rotation    Knee flexion 2+ 2+  Knee extension 2+ 2+  Ankle dorsiflexion    Ankle plantarflexion    Ankle inversion    Ankle eversion     (Blank rows = not tested)  LOWER EXTREMITY SPECIAL TESTS:  DNT  FUNCTIONAL TESTS:  30 Second Sit to Stand: 6 reps with UE  GAIT: Distance walked: 14ft Assistive device utilized: None Level of assistance: SBA Comments: antalgic gait bilaterally, decreased gait speed  TREATMENT: [facility] Adult PT Treatment:                                                DATE: 01/21/24 Therapeutic Exercise: LAQ 10 x 3 each  Seated March 10 x 2 each  Seated Calf stretch with towel x 3 each  Manual Therapy: [email or web address]  Right knee  Manual knee mobs left knee for flexion and extension  Mcconnell tape medial pull to patella right knee   Self Care: Pt given information on KT tape and Leuko tape and how to purchase on [email or web address] Pt given instructions on how to apply KT tape by watching You tube video listed above.    [facility] Adult PT Treatment:                                                DATE: 01/03/2024 Therapeutic Exercise: Seated QS x 5 - 5 hold Seated hamstring stretch x 30 each Seated LAQ x 5 each  PATIENT EDUCATION:  Education details: eval findings, LEFS, HEP, POC Person  educated: Patient Education method: Explanation, Demonstration, and Handouts Education comprehension: verbalized understanding and returned  demonstration  HOME EXERCISE PROGRAM: Access Code: [email or web address] URL: [email or web address] Date: 01/06/2024 Prepared by: Alm Kingdom  Exercises - Seated Quad Set  - 2-3 x daily - 7 x weekly - 2 sets - 10 reps - 5 sec hold - Seated Hamstring Stretch  - 2-3 x daily - 7 x weekly - 2-3 reps - 30 sec hold - Seated Long Arc Quad  - 2-3 x daily - 7 x weekly - 3 sets - 10 reps - 5 sec hold  ASSESSMENT:  CLINICAL IMPRESSION: Pt reports she is having left knee nerve pains in her leg and medial knee pain. . And right knee feel like it pops out to the side. Manual joint mobilizations performed to let knee as well as KT tape to reduce anteromedial knee pain. McConnel Tape applied to reduce lateral tracking of right patella. Pt reported reduced pain with LE movements and transfers after manual and tape. Able to complete exercises with reduced pain. Updated HEP and provided self care on use of tape and how to purchase for home use. Will further assess response to tape and consider further education on self application at future visits.    EVAL: Patient is a 58 y.o. F who was seen today for physical therapy evaluation and treatment for chronic bilateral knee pain. Pt has complex PMH and is well known to therapist, with physical findings consistent with physician impression as pt demonstrates significant decrease in bilateral knee strength and ROM. Functional mobility deficits observed in setting of 30 Second Sit to Stand and general transfer ability. LEFS score shows severe disability in performance of home ADLs and higher level community activities. Pt would benefit from skilled PT services working on improving LE strength and mobility in order to decrease pain and improve function.   OBJECTIVE IMPAIRMENTS: Abnormal gait, decreased activity tolerance, decreased balance, decreased endurance, decreased mobility, difficulty walking, decreased ROM, decreased strength, postural dysfunction, obesity, and  pain  ACTIVITY LIMITATIONS: carrying, lifting, bending, sitting, standing, squatting, sleeping, stairs, transfers, and locomotion level  PARTICIPATION LIMITATIONS: meal prep, cleaning, driving, shopping, community activity, occupation, and yard work  PERSONAL FACTORS: Time since onset of injury/illness/exacerbation and 3+ comorbidities: DM II, Neuropathy, HTN, CHF are also affecting patient's functional outcome.   REHAB POTENTIAL: Fair - complicated by PMH and chronicity of condition  CLINICAL DECISION MAKING: Evolving/moderate complexity  EVALUATION COMPLEXITY: Moderate   GOALS: Goals reviewed with patient? No  SHORT TERM GOALS: Target date: 01/24/2024   Pt will be compliant and knowledgeable with initial HEP for improved comfort and carryover Baseline: initial HEP given  01/21/24: has been I too much pain Goal status: ONGOING  2.  Pt will self report bilateral knee pain no greater than 8/10 for improved comfort and functional ability Baseline: 10/10 at worst Goal status: INITIAL   LONG TERM GOALS: Target date: 02/28/2024   Pt will improve LEFS to no less than 25/80 as proxy for functional improvement with home ADLs and higher level community activity Baseline: 13/80 Goal status: INITIAL  2.  Pt will self report bilateral knee pain no greater than 5/10 for improved comfort and functional ability Baseline: 10/10 at worst Goal status: INITIAL   3.  Pt will increase 30 Second Sit to Stand rep count to no less than 9 reps for improved balance, strength, and functional mobility Baseline: 6 reps with UE Goal status: INITIAL   4.  Pt will improve bilateral  knee ext to at least 5 degrees to full extension for improved comfort and functional mobility Baseline: see chart Goal status: INITIAL  5.  Pt will improve bilateral LE MMT to no less than 3+/5 for improved comfort and functional mobility Baseline:  see chart Goal status: INITIAL    PLAN:  PT FREQUENCY:  1-2x/week  PT DURATION: 8 weeks  PLANNED INTERVENTIONS: 97164- PT Re-evaluation, 97110-Therapeutic exercises, 97530- Therapeutic activity, [id number]- Neuromuscular re-education, 97535- Self Care, [id number]- Manual therapy, [id number]- Gait training, [phone]- Aquatic Therapy, [phone]- Electrical stimulation (unattended), [phone]- Electrical stimulation (manual), 20560 (1-2 muscles), 20561 (3+ muscles)- Dry Needling, and Patient/Family education  PLAN FOR NEXT SESSION: assess HEP response, LE strengthening, knee ROM  For all possible CPT codes, reference the Planned Interventions line above.     Check all conditions that are expected to impact treatment: {Conditions expected to impact treatment:Respiratory disorders, Diabetes mellitus, and Musculoskeletal disorders   If treatment provided at initial evaluation, no treatment charged due to lack of authorization.       Harlene Persons, PTA 01/21/24 12:15 PM Phone: [phone] Fax: [phone]

## 2024-01-23 ENCOUNTER — Ambulatory Visit

## 2024-01-23 NOTE — Therapy (Incomplete)
 OUTPATIENT PHYSICAL THERAPY LOWER EXTREMITY TREATMENT   Patient Name: Rita Bass MRN: [id number] DOB:01-Sep-1965, 58 y.o., female Today's Date: 01/23/2024  END OF SESSION:    Past Medical History:  Diagnosis Date   Arthritis    Asthma    Chronic pain of left knee    Complication of anesthesia    PONV   Diabetes (HCC)    GERD (gastroesophageal reflux disease)    H/O bronchitis    Hepatic steatosis    Internal hemorrhoids    Kidney stones    Obesity    Renal disorder    Shortness of breath dyspnea    Sleep apnea    past issues - at weight over 400lbs   Sleep apnea in adult    Polysomnogram pending.  Followed by Dr. Shellia   Past Surgical History:  Procedure Laterality Date   BIOPSY  11/01/2020   Procedure: BIOPSY;  Surgeon: San Sandor GAILS, DO;  Location: [facility];  Service: Gastroenterology;;   BIOPSY  05/11/2021   Procedure: BIOPSY;  Surgeon: San Sandor GAILS, DO;  Location: [facility];  Service: Gastroenterology;;   BIOPSY  11/29/2022   Procedure: BIOPSY;  Surgeon: San Sandor GAILS, DO;  Location: [facility];  Service: Gastroenterology;;   CESAREAN SECTION     6 c-sections   CHOLECYSTECTOMY     COLONOSCOPY WITH PROPOFOL  N/A 05/11/2021   Procedure: COLONOSCOPY WITH PROPOFOL ;  Surgeon: San Sandor GAILS, DO;  Location: [facility];  Service: Gastroenterology;  Laterality: N/A;   ESOPHAGOGASTRODUODENOSCOPY (EGD) WITH PROPOFOL  N/A 11/01/2020   Procedure: ESOPHAGOGASTRODUODENOSCOPY (EGD) WITH PROPOFOL ;  Surgeon: San Sandor GAILS, DO;  Location: [facility];  Service: Gastroenterology;  Laterality: N/A;   ESOPHAGOGASTRODUODENOSCOPY (EGD) WITH PROPOFOL  N/A 11/29/2022   Procedure: ESOPHAGOGASTRODUODENOSCOPY (EGD) WITH PROPOFOL ;  Surgeon: San Sandor GAILS, DO;  Location: [facility];  Service: Gastroenterology;  Laterality: N/A;   HERNIA REPAIR     KIDNEY STONE SURGERY     LEFT HEART CATH AND CORONARY ANGIOGRAPHY N/A 11/05/2023   Procedure: LEFT  HEART CATH AND CORONARY ANGIOGRAPHY;  Surgeon: Anner Alm ORN, MD;  Location: [facility] INVASIVE CV LAB;  Service: Cardiovascular;  Laterality: N/A;   OOPHORECTOMY     TRANSTHORACIC ECHOCARDIOGRAM  09/2017   Technically difficult study.  Did not use Definity  contrast.  EF was 55 to 60% with moderate LVH and grade 1 diastolic dysfunction.  No significant valvular lesions noted.  No regional wall motion normality but difficult to assess Bass to poor imaging.    TUBAL LIGATION     UPPER GASTROINTESTINAL ENDOSCOPY     Patient Active Problem List   Diagnosis Date Noted   History of deep venous thrombosis (DVT) of distal vein of left lower extremity 11/25/2023   Palpitations 06/27/2023   Coronary artery disease involving native coronary artery of native heart without angina pectoris 06/27/2023   Sleep disturbance 06/20/2023   Shortness of breath 03/18/2023   Chronic abdominal pain 12/24/2022   Medication management 12/21/2022   Chronic Helicobacter pylori gastritis 11/29/2022   Gastric ulcer without hemorrhage or perforation 11/29/2022   Gastroesophageal reflux disease with esophagitis without hemorrhage 11/29/2022   Change in bowel habits 11/29/2022   Diarrhea 11/29/2022   Asthma 10/22/2022   Primary osteoarthritis of both knees 10/03/2022   Type 2 diabetes mellitus with diabetic neuropathy, unspecified (HCC) 04/30/2022   Chronic osteoarthritis 04/30/2022   Rash 01/15/2022   External hemorrhoid 09/15/2021   Ventral hernia 08/16/2021   Allergic conjunctivitis 07/28/2021   Grade II internal hemorrhoids  H. pylori infection 11/08/2020   Gastritis and gastroduodenitis    Lung nodule 10/26/2020   Ventral hernia without obstruction or gangrene 10/04/2020   Chronic diarrhea 09/16/2020   Abnormal Pap smear of cervix 08/18/2020   Hyperlipidemia associated with type 2 diabetes mellitus (HCC) 06/21/2020   Right hip pain 06/21/2020   Orthopnea 01/04/2020   Chronic cough 12/23/2019   Dyspepsia  05/22/2019   Hypertension 04/07/2019   Vaginal bleeding 01/27/2019   Low back pain 01/27/2019   BMI 50.0-59.9, adult (HCC) 12/05/2017   Diastolic dysfunction with heart failure (HCC) 10/25/2017   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   Diabetic nephropathy associated with type 2 diabetes mellitus (HCC) 10/25/2017   Type 2 diabetes mellitus with complication (HCC) 09/13/2017   Sleep apnea 08/16/2017   Chronic pain of right knee 07/13/2016   Morbid obesity (HCC) 02/04/2015   Menorrhagia with regular cycle 11/18/2014    PCP: [name], MD  REFERRING PROVIDER: McDiarmid, Krystal BIRCH, MD   REFERRING DIAG: M17.0 (ICD-10-CM) - Primary osteoarthritis of both knees   THERAPY DIAG:  No diagnosis found.  Rationale for Evaluation and Treatment: Rehabilitation  ONSET DATE: Chronic  SUBJECTIVE:   SUBJECTIVE STATEMENT: ***  EVAL: Pt presents to PT with reports of chronic bilateral knee pain with recent R>L. Pt is well known to PT, has complex PMH in setting of cardiovascular system. Recent concern for L DVT, awaiting results but they sent her home from ED on 12/16/2023. Also waiting until stable to address [facility] Pylori [facility] in a few months. Lateral knee pain bilaterally, pain with transfers and overall mobility.   PERTINENT HISTORY: DM II, Neuropathy, HTN, CHF  PAIN:  Are you having pain?  Yes: NPRS scale: 7/10 Worst: 10/10 Pain location: bilateral knees Pain description: sharp, sore Aggravating factors: standing, transfers, walking Relieving factors: rest, medication  PRECAUTIONS: Fall  RED FLAGS: None   WEIGHT BEARING RESTRICTIONS: No  FALLS:  Has patient fallen in last 6 months? Yes. Number of falls numerous - no falls to floor but has fallen backwards on bed or chair with transfers  LIVING ENVIRONMENT: Lives with: lives with their daughter Lives in: House/apartment Stairs: Yes: External: 3 steps; on right going up Has following equipment at home: None  OCCUPATION: Not  working  PLOF: Independent with basic ADLs  PATIENT GOALS: decrease knee pain, be able to move better  OBJECTIVE:  Note: Objective measures were completed at Evaluation unless otherwise noted.  DIAGNOSTIC FINDINGS: see imaging   PATIENT SURVEYS:  LEFS  Extreme difficulty/unable (0), Quite a bit of difficulty (1), Moderate difficulty (2), Little difficulty (3), No difficulty (4) Survey date:  01/06/2024  Any of your usual work, housework or school activities 0  2. Usual hobbies, recreational or sporting activities 0  3. Getting into/out of the bath 0  4. Walking between rooms 1  5. Putting on socks/shoes 1  6. Squatting  0  7. Lifting an object, like a bag of groceries from the floor 3  8. Performing light activities around your home 3  9. Performing heavy activities around your home 0  10. Getting into/out of a car 0  11. Walking 2 blocks 0  12. Walking 1 mile 0  13. Going up/down 10 stairs (1 flight) 1  14. Standing for 1 hour 0  15.  sitting for 1 hour 2  16. Running on even ground 0  17. Running on uneven ground 0  18. Making sharp turns while running fast 0  19. Hopping  0  20. Rolling over in bed 2  Score total:  13/80     COGNITION: Overall cognitive status: Within functional limits for tasks assessed     SENSATION: WFL  POSTURE: rounded shoulders, forward head, increased lumbar lordosis, and large body habitus  PALPATION: TTP to distal R quad, patellar hypomobility  LOWER EXTREMITY ROM:  Active ROM Right eval Left eval  Hip flexion    Hip extension    Hip abduction    Hip adduction    Hip internal rotation    Hip external rotation    Knee flexion 101 90  Knee extension 13 27  Ankle dorsiflexion    Ankle plantarflexion    Ankle inversion    Ankle eversion     (Blank rows = not tested)  LOWER EXTREMITY MMT:  MMT Right eval Left eval  Hip flexion 3+ 3+  Hip extension    Hip abduction 3+ 3+  Hip adduction    Hip internal rotation    Hip  external rotation    Knee flexion 2+ 2+  Knee extension 2+ 2+  Ankle dorsiflexion    Ankle plantarflexion    Ankle inversion    Ankle eversion     (Blank rows = not tested)  LOWER EXTREMITY SPECIAL TESTS:  DNT  FUNCTIONAL TESTS:  30 Second Sit to Stand: 6 reps with UE  GAIT: Distance walked: 27ft Assistive device utilized: None Level of assistance: SBA Comments: antalgic gait bilaterally, decreased gait speed  TREATMENT: [facility] Adult PT Treatment:                                                DATE: 01/21/24 Therapeutic Exercise: LAQ 10 x 3 each  Seated March 10 x 2 each  Seated Calf stretch with towel x 3 each  Manual Therapy: [email or web address]  Right knee  Manual knee mobs left knee for flexion and extension  Mcconnell tape medial pull to patella right knee   Self Care: Pt given information on KT tape and Leuko tape and how to purchase on [email or web address] Pt given instructions on how to apply KT tape by watching You tube video listed above.    [facility] Adult PT Treatment:                                                DATE: 01/03/2024 Therapeutic Exercise: Seated QS x 5 - 5 hold Seated hamstring stretch x 30 each Seated LAQ x 5 each  PATIENT EDUCATION:  Education details: eval findings, LEFS, HEP, POC Person educated: Patient Education method: Explanation, Demonstration, and Handouts Education comprehension: verbalized understanding and returned demonstration  HOME EXERCISE PROGRAM: Access Code: [email or web address] URL: [email or web address] Date: 01/06/2024 Prepared by: Alm Kingdom  Exercises - Seated Quad Set  - 2-3 x daily - 7 x weekly - 2 sets - 10 reps - 5 sec hold - Seated Hamstring Stretch  - 2-3 x daily - 7 x weekly - 2-3 reps - 30 sec hold - Seated Long Arc Quad  - 2-3 x daily - 7 x weekly - 3 sets - 10 reps - 5 sec hold  ASSESSMENT:  CLINICAL IMPRESSION: *** Pt reports she is having left knee nerve pains in her leg and  medial knee pain. . And right knee feel like it pops out to the side. Manual joint mobilizations performed to let knee as well as KT tape to reduce anteromedial knee pain. McConnel Tape applied to reduce lateral tracking of right patella. Pt reported reduced pain with LE movements and transfers after manual and tape. Able to complete exercises with reduced pain. Updated HEP and provided self care on use of tape and how to purchase for home use. Will further assess response to tape and consider further education on self application at future visits.    EVAL: Patient is a 58 y.o. F who was seen today for physical therapy evaluation and treatment for chronic bilateral knee pain. Pt has complex PMH and is well known to therapist, with physical findings consistent with physician impression as pt demonstrates significant decrease in bilateral knee strength and ROM. Functional mobility deficits observed in setting of 30 Second Sit to Stand and general transfer ability. LEFS score shows severe disability in performance of home ADLs and higher level community activities. Pt would benefit from skilled PT services working on improving LE strength and mobility in order to decrease pain and improve function.   OBJECTIVE IMPAIRMENTS: Abnormal gait, decreased activity tolerance, decreased balance, decreased endurance, decreased mobility, difficulty walking, decreased ROM, decreased strength, postural dysfunction, obesity, and pain  ACTIVITY LIMITATIONS: carrying, lifting, bending, sitting, standing, squatting, sleeping, stairs, transfers, and locomotion level  PARTICIPATION LIMITATIONS: meal prep, cleaning, driving, shopping, community activity, occupation, and yard work  PERSONAL FACTORS: Time since onset of injury/illness/exacerbation and 3+ comorbidities: DM II, Neuropathy, HTN, CHF are also affecting patient's functional outcome.   REHAB POTENTIAL: Fair - complicated by PMH and chronicity of condition  CLINICAL  DECISION MAKING: Evolving/moderate complexity  EVALUATION COMPLEXITY: Moderate   GOALS: Goals reviewed with patient? No  SHORT TERM GOALS: Target date: 01/24/2024   Pt will be compliant and knowledgeable with initial HEP for improved comfort and carryover Baseline: initial HEP given  01/21/24: has been I too much pain Goal status: ONGOING  2.  Pt will self report bilateral knee pain no greater than 8/10 for improved comfort and functional ability Baseline: 10/10 at worst Goal status: INITIAL   LONG TERM GOALS: Target date: 02/28/2024   Pt will improve LEFS to no less than 25/80 as proxy for functional improvement with home ADLs and higher level community activity Baseline: 13/80 Goal status: INITIAL  2.  Pt will self report bilateral knee pain no greater than 5/10 for improved comfort and functional ability Baseline: 10/10 at worst Goal status: INITIAL   3.  Pt will increase 30 Second Sit to Stand rep count to no less than 9 reps for improved balance, strength, and functional mobility Baseline: 6 reps with UE Goal status: INITIAL   4.  Pt will improve bilateral knee ext to at least 5 degrees to full extension for improved comfort and functional mobility Baseline: see chart Goal status: INITIAL  5.  Pt will improve bilateral LE MMT to no less than 3+/5 for improved comfort and functional mobility Baseline:  see chart Goal status: INITIAL    PLAN:  PT FREQUENCY: 1-2x/week  PT DURATION: 8 weeks  PLANNED INTERVENTIONS: 97164- PT Re-evaluation, 97110-Therapeutic exercises, 97530- Therapeutic activity, [id number]- Neuromuscular re-education, 97535- Self Care, 02859- Manual therapy, [id number]- Gait training, [phone]- Aquatic Therapy, [phone]- Electrical stimulation (unattended), [id number]- Electrical stimulation (manual), 20560 (1-2 muscles), 20561 (3+ muscles)- Dry Needling, and Patient/Family education  PLAN FOR NEXT SESSION: assess HEP response, LE strengthening, knee  ROM  For all  possible CPT codes, reference the Planned Interventions line above.     Check all conditions that are expected to impact treatment: {Conditions expected to impact treatment:Respiratory disorders, Diabetes mellitus, and Musculoskeletal disorders   If treatment provided at initial evaluation, no treatment charged Bass to lack of authorization.       Alm JAYSON Kingdom PT  01/23/24 8:04 AM

## 2024-01-27 ENCOUNTER — Telehealth: Payer: Self-pay

## 2024-01-27 ENCOUNTER — Ambulatory Visit

## 2024-01-27 ENCOUNTER — Ambulatory Visit: Admitting: Pharmacist

## 2024-01-27 NOTE — Telephone Encounter (Signed)
 [place] Odessia Scot  PT, DPT

## 2024-01-29 ENCOUNTER — Ambulatory Visit: Attending: Student in an Organized Health Care Education/Training Program | Admitting: Cardiology

## 2024-01-29 NOTE — Progress Notes (Deleted)
 Cardiology Office Note:  .   Date:  01/29/2024  ID:  Rita Bass, DOB [birth year], MRN [id number] PCP: [name], MD  [facility] Providers Cardiologist:  Alm Clay, MD { Click to update primary MD,subspecialty MD or APP then REFRESH:1}    No chief complaint on file.   Patient Profile: .     Rita Bass is a morbidly obese 58 y.o. female with a PMH notable for CAD-PCI,~Class II HFpEF, HTN, HLD, DM-2, DVT, OSA and PSVT/Atrial Tach who presents here for 80-month/post cath follow-up.   PMH: CAD-first noted coronary calculation on CT scan in 2024 Seen in April 2025 with complaints of chest pain and dizziness Coronary CTA July 2025 with distal LAD stenosis FFR CT positive => cardiac cath 11/05/2023 HFpEF Hypertension Hyperlipidemia and Diabetes Mellitus Type 2 OSA  History of DVT (May 2025) -> treated with Xarelto , no longer present as of Dopplers in July 2025 Zio patch monitor showed 6 short runs of PAT/PSVT      Rita Bass was last seen on October 30, 2023 by Damien Braver, NP to discuss results of her coronary CTA and schedule cardiac catheterization on 11/05/2023.  Subjective  Discussed the use of AI scribe software for clinical note transcription with the patient, who gave verbal consent to proceed.  History of Present Illness      Cardiovascular ROS: {roscv:310661}  ROS:  Review of Systems - {ros master:310782}    Objective    Studies Reviewed: SABRA        Lab Results  Component Value Date   NA 137 11/25/2023   K 4.2 11/25/2023   CREATININE 0.69 11/25/2023   GFRNONAA >60 11/25/2023   GLUCOSE 283 (H) 11/25/2023   Lab Results  Component Value Date   CHOL 192 06/21/2020   HDL 54 06/21/2020   LDLCALC 115 (H) 06/21/2020   LDLDIRECT 117 (H) 02/28/2021   TRIG 131 06/21/2020   CHOLHDL 3.6 06/21/2020   Lab Results  Component Value Date   HGBA1C 10.5 (A) 11/04/2023   Results  Echocardiogram: EF 55 to 60%.  No  RWMA.  G1 DD.  Normal RV size and function.  Normal RAP.  Normal aortic and mitral valves.  (January 2025) Zio patch monitor: Predominant underlying rhythm is sinus rhythm with a rate range of 49-1 57 bpm with an average of 91 bpm.  Rare PACs and PVCs with couplets and triplets.  6 short atrial runs of 4-12 beats.  Fastest was 4 beats at 179 bpm and longest was 12 beats with an average rate of 134 bpm.  Symptoms noted with PACs and PVCs as well as sinus rhythm.  (April 2025) Follow-up LE V Dopplers: No evidence of DVT in the left lower remedy.  No right-sided DVT in femoral vein.  (11/28/2023) Coronary CTA: CAC 131.  Mild plaque diffusely.  Mild calcified plaque in the proximal LAD with moderate soft plaque in the distal LAD.SABRA  Distal LAD FFR CT positive at 0.73.  (10/17/2023)   CATH: Right dominant system with mostly normal coronary arteries.  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  There is a very large first diagonal branch that is actually larger than the native LAD.  Started on Ranexa.    Risk Assessment/Calculations:     No BP recorded.  {Refresh Note OR Click here to enter BP  :1}***         Physical Exam:   VS:  LMP 04/05/2016    Wt Readings from Last 3 Encounters:  12/25/23 (!) 310 lb (140.6 kg)  11/27/23 (!) 309 lb (140.2 kg)  11/27/23 (!) 309 lb 6.4 oz (140.3 kg)    Physical Exam    GEN: Well nourished, well developed in no acute distress; *** NECK: No JVD; No carotid bruits CARDIAC: Normal S1, S2; RRR, no murmurs, rubs, gallops RESPIRATORY:  Clear to auscultation without rales, wheezing or rhonchi ; nonlabored, good air movement. ABDOMEN: Soft, non-tender, non-distended EXTREMITIES:  No edema; No deformity      ASSESSMENT AND PLAN: .    Problem List Items Addressed This Visit   None   Assessment and Plan Assessment & Plan        {Are you ordering a CV Procedure (e.g. stress test, cath, DCCV,  TEE, etc)?   Press F2        :[id number]}   Follow-Up: No follow-ups on file.  I spent *** minutes in the care of Chante Casad Israelson today including {CHL AMB CAR Time Based Billing Options STW (Optional):[phone]::documenting in the encounter.}      Signed, Alm MICAEL Clay, MD, MS Alm Clay, M.D., M.S. Interventional Cardiologist  [facility] Pager # [phone]

## 2024-01-30 ENCOUNTER — Encounter: Payer: Self-pay | Admitting: Cardiology

## 2024-01-30 ENCOUNTER — Ambulatory Visit: Admitting: Physical Therapy

## 2024-01-31 ENCOUNTER — Other Ambulatory Visit: Payer: Self-pay

## 2024-01-31 DIAGNOSIS — R1033 Periumbilical pain: Secondary | ICD-10-CM

## 2024-01-31 DIAGNOSIS — M170 Bilateral primary osteoarthritis of knee: Secondary | ICD-10-CM

## 2024-01-31 MED ORDER — TRAMADOL HCL 50 MG PO TABS: 50.0000 mg | ORAL_TABLET | Freq: Two times a day (BID) | ORAL | 0 refills | Status: DC | PRN

## 2024-02-03 ENCOUNTER — Ambulatory Visit

## 2024-02-04 ENCOUNTER — Ambulatory Visit

## 2024-02-04 DIAGNOSIS — M6281 Muscle weakness (generalized): Secondary | ICD-10-CM

## 2024-02-04 DIAGNOSIS — M25562 Pain in left knee: Secondary | ICD-10-CM | POA: Diagnosis not present

## 2024-02-04 DIAGNOSIS — M25561 Pain in right knee: Secondary | ICD-10-CM | POA: Diagnosis not present

## 2024-02-04 DIAGNOSIS — R2689 Other abnormalities of gait and mobility: Secondary | ICD-10-CM | POA: Diagnosis not present

## 2024-02-04 DIAGNOSIS — G8929 Other chronic pain: Secondary | ICD-10-CM

## 2024-02-04 NOTE — Therapy (Signed)
 OUTPATIENT PHYSICAL THERAPY LOWER EXTREMITY TREATMENT   Patient Name: Rita Bass MRN: [id number] DOB:1965/11/12, 58 y.o., female Today's Date: 02/04/2024  END OF SESSION:  PT End of Session - 02/04/24 1027     Visit Number 3    Number of Visits 17    Date for Recertification  02/28/24    Authorization Type [employer]    Authorization Time Period 01/03/24-03/02/24    Authorization - Number of Visits 6    PT Start Time 1020    PT Stop Time 1100    PT Time Calculation (min) 40 min           Past Medical History:  Diagnosis Date   Arthritis    Asthma    Chronic pain of left knee    Complication of anesthesia    PONV   Diabetes (HCC)    GERD (gastroesophageal reflux disease)    H/O bronchitis    Hepatic steatosis    Internal hemorrhoids    Kidney stones    Obesity    Renal disorder    Shortness of breath dyspnea    Sleep apnea    past issues - at weight over 400lbs   Sleep apnea in adult    Polysomnogram pending.  Followed by Dr. Shellia   Past Surgical History:  Procedure Laterality Date   BIOPSY  11/01/2020   Procedure: BIOPSY;  Surgeon: San Sandor GAILS, DO;  Location: [facility];  Service: Gastroenterology;;   BIOPSY  05/11/2021   Procedure: BIOPSY;  Surgeon: San Sandor GAILS, DO;  Location: [facility];  Service: Gastroenterology;;   BIOPSY  11/29/2022   Procedure: BIOPSY;  Surgeon: San Sandor GAILS, DO;  Location: [facility];  Service: Gastroenterology;;   CESAREAN SECTION     6 c-sections   CHOLECYSTECTOMY     COLONOSCOPY WITH PROPOFOL  N/A 05/11/2021   Procedure: COLONOSCOPY WITH PROPOFOL ;  Surgeon: San Sandor GAILS, DO;  Location: [facility];  Service: Gastroenterology;  Laterality: N/A;   ESOPHAGOGASTRODUODENOSCOPY (EGD) WITH PROPOFOL  N/A 11/01/2020   Procedure: ESOPHAGOGASTRODUODENOSCOPY (EGD) WITH PROPOFOL ;  Surgeon: San Sandor GAILS, DO;  Location: [facility];  Service: Gastroenterology;  Laterality: N/A;    ESOPHAGOGASTRODUODENOSCOPY (EGD) WITH PROPOFOL  N/A 11/29/2022   Procedure: ESOPHAGOGASTRODUODENOSCOPY (EGD) WITH PROPOFOL ;  Surgeon: San Sandor GAILS, DO;  Location: [facility];  Service: Gastroenterology;  Laterality: N/A;   HERNIA REPAIR     KIDNEY STONE SURGERY     LEFT HEART CATH AND CORONARY ANGIOGRAPHY N/A 11/05/2023   Procedure: LEFT HEART CATH AND CORONARY ANGIOGRAPHY;  Surgeon: Anner Alm ORN, MD;  Location: [facility];  Service: Cardiovascular;  Laterality: N/A;   OOPHORECTOMY     TRANSTHORACIC ECHOCARDIOGRAM  09/2017   Technically difficult study.  Did not use Definity  contrast.  EF was 55 to 60% with moderate LVH and grade 1 diastolic dysfunction.  No significant valvular lesions noted.  No regional wall motion normality but difficult to assess due to poor imaging.    TUBAL LIGATION     UPPER GASTROINTESTINAL ENDOSCOPY     Patient Active Problem List   Diagnosis Date Noted   History of deep venous thrombosis (DVT) of distal vein of left lower extremity 11/25/2023   Palpitations 06/27/2023   Coronary artery disease involving native coronary artery of native heart without angina pectoris 06/27/2023   Sleep disturbance 06/20/2023   Shortness of breath 03/18/2023   Chronic abdominal pain 12/24/2022   Medication management 12/21/2022   Chronic Helicobacter pylori gastritis 11/29/2022  Gastric ulcer without hemorrhage or perforation 11/29/2022   Gastroesophageal reflux disease with esophagitis without hemorrhage 11/29/2022   Change in bowel habits 11/29/2022   Diarrhea 11/29/2022   Asthma 10/22/2022   Primary osteoarthritis of both knees 10/03/2022   Type 2 diabetes mellitus with diabetic neuropathy, unspecified (HCC) 04/30/2022   Chronic osteoarthritis 04/30/2022   Rash 01/15/2022   External hemorrhoid 09/15/2021   Ventral hernia 08/16/2021   Allergic conjunctivitis 07/28/2021   Grade II internal hemorrhoids    H. pylori infection 11/08/2020   Gastritis and  gastroduodenitis    Lung nodule 10/26/2020   Ventral hernia without obstruction or gangrene 10/04/2020   Chronic diarrhea 09/16/2020   Abnormal Pap smear of cervix 08/18/2020   Hyperlipidemia associated with type 2 diabetes mellitus (HCC) 06/21/2020   Right hip pain 06/21/2020   Orthopnea 01/04/2020   Chronic cough 12/23/2019   Dyspepsia 05/22/2019   Primary hypertension 04/07/2019   Vaginal bleeding 01/27/2019   Low back pain 01/27/2019   BMI 50.0-59.9, adult (HCC) 12/05/2017   Diastolic dysfunction with heart failure (HCC) 10/25/2017   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   Diabetic nephropathy associated with type 2 diabetes mellitus (HCC) 10/25/2017   Type 2 diabetes mellitus with complication (HCC) 09/13/2017   Sleep apnea 08/16/2017   Chronic pain of right knee 07/13/2016   Morbid obesity (HCC) 02/04/2015   Menorrhagia with regular cycle 11/18/2014    PCP: [name], MD  REFERRING PROVIDER: McDiarmid, Krystal BIRCH, MD   REFERRING DIAG: M17.0 (ICD-10-CM) - Primary osteoarthritis of both knees   THERAPY DIAG:  Other abnormalities of gait and mobility  Chronic pain of right knee  Chronic pain of left knee  Muscle weakness (generalized)  Rationale for Evaluation and Treatment: Rehabilitation  ONSET DATE: Chronic  SUBJECTIVE:   SUBJECTIVE STATEMENT: Pt reports feeling in pain today with both knees hurting pretty bad. Has been trying to do HEP as she can.  EVAL: Pt presents to PT with reports of chronic bilateral knee pain with recent R>L. Pt is well known to PT, has complex PMH in setting of cardiovascular system. Recent concern for L DVT, awaiting results but they sent her home from ED on 12/16/2023. Also waiting until stable to address H. Pylori at [facility] in a few months. Lateral knee pain bilaterally, pain with transfers and overall mobility.   PERTINENT HISTORY: DM II, Neuropathy, HTN, CHF  PAIN:  6C Are you having pain?  Yes: NPRS scale: 7/10 Worst: 10/10 Pain  location: bilateral knees Pain description: sharp, sore Aggravating factors: standing, transfers, walking Relieving factors: rest, medication  PRECAUTIONS: Fall  RED FLAGS: None   WEIGHT BEARING RESTRICTIONS: No  FALLS:  Has patient fallen in last 6 months? Yes. Number of falls numerous - no falls to floor but has fallen backwards on bed or chair with transfers  LIVING ENVIRONMENT: Lives with: lives with their daughter Lives in: House/apartment Stairs: Yes: External: 3 steps; on right going up Has following equipment at home: None  OCCUPATION: Not working  PLOF: Independent with basic ADLs  PATIENT GOALS: decrease knee pain, be able to move better  OBJECTIVE:  Note: Objective measures were completed at Evaluation unless otherwise noted.  DIAGNOSTIC FINDINGS: see imaging   PATIENT SURVEYS:  LEFS  Extreme difficulty/unable (0), Quite a bit of difficulty (1), Moderate difficulty (2), Little difficulty (3), No difficulty (4) Survey date:  01/06/2024  Any of your usual work, housework or school activities 0  2. Usual hobbies, recreational or sporting activities 0  3. Getting into/out of the bath 0  4. Walking between rooms 1  5. Putting on socks/shoes 1  6. Squatting  0  7. Lifting an object, like a bag of groceries from the floor 3  8. Performing light activities around your home 3  9. Performing heavy activities around your home 0  10. Getting into/out of a car 0  11. Walking 2 blocks 0  12. Walking 1 mile 0  13. Going up/down 10 stairs (1 flight) 1  14. Standing for 1 hour 0  15.  sitting for 1 hour 2  16. Running on even ground 0  17. Running on uneven ground 0  18. Making sharp turns while running fast 0  19. Hopping  0  20. Rolling over in bed 2  Score total:  13/80     COGNITION: Overall cognitive status: Within functional limits for tasks assessed     SENSATION: WFL  POSTURE: rounded shoulders, forward head, increased lumbar lordosis, and large body  habitus  PALPATION: TTP to distal R quad, patellar hypomobility  LOWER EXTREMITY ROM:  Active ROM Right eval Left eval  Hip flexion    Hip extension    Hip abduction    Hip adduction    Hip internal rotation    Hip external rotation    Knee flexion 101 90  Knee extension 13 27  Ankle dorsiflexion    Ankle plantarflexion    Ankle inversion    Ankle eversion     (Blank rows = not tested)  LOWER EXTREMITY MMT:  MMT Right eval Left eval  Hip flexion 3+ 3+  Hip extension    Hip abduction 3+ 3+  Hip adduction    Hip internal rotation    Hip external rotation    Knee flexion 2+ 2+  Knee extension 2+ 2+  Ankle dorsiflexion    Ankle plantarflexion    Ankle inversion    Ankle eversion     (Blank rows = not tested)  LOWER EXTREMITY SPECIAL TESTS:  DNT  FUNCTIONAL TESTS:  30 Second Sit to Stand: 6 reps with UE  GAIT: Distance walked: 59ft Assistive device utilized: None Level of assistance: SBA Comments: antalgic gait bilaterally, decreased gait speed  TREATMENT: *Patient required extra time for exercises due to increased monitoring, reassessment, and rest due to low activity tolerance and/or high irritability of symptoms* *Patient required extra time for exercises due to additional reeducation on pain science, optimal loading, prognosis, and relevant tissues/anatomy*  Therapeutic Exercise 02/04/24 Reassessment and update for update Seated LAQ x6 BL (did not tolerate) Supine SAQ 2x8x3s BL Seated heel slides x6 (working within pain free ROM) Seated marches x6x3s Seated calf raises x6x3s  Manual Therapy: Mcconnell tape medial pull to patella right knee  L knee Pfps taping ([email or web address])      [facility] Adult PT Treatment:                                                DATE: 01/21/24 Therapeutic Exercise: LAQ 10 x 3 each  Seated March 10 x 2 each  Seated Calf stretch with towel x 3 each  Manual  Therapy: [email or web address]  Right knee  Manual knee mobs left knee for flexion and extension  Mcconnell tape medial pull to patella right knee   Self Care: Pt given information on KT tape and Leuko tape  and how to purchase on [email or web address] Pt given instructions on how to apply KT tape by watching You tube video listed above.    [facility] Adult PT Treatment:                                                DATE: 01/03/2024 Therapeutic Exercise: Seated QS x 5 - 5 hold Seated hamstring stretch x 30 each Seated LAQ x 5 each  PATIENT EDUCATION:  Education details: eval findings, LEFS, HEP, POC Person educated: Patient Education method: Explanation, Demonstration, and Handouts Education comprehension: verbalized understanding and returned demonstration  HOME EXERCISE PROGRAM: Access Code: [email or web address] URL: [email or web address] Date: 01/06/2024 Prepared by: Alm Kingdom  Exercises - Seated Quad Set  - 2-3 x daily - 7 x weekly - 2 sets - 10 reps - 5 sec hold - Seated Hamstring Stretch  - 2-3 x daily - 7 x weekly - 2-3 reps - 30 sec hold - Seated Long Arc Quad  - 2-3 x daily - 7 x weekly - 3 sets - 10 reps - 5 sec hold  ASSESSMENT:  CLINICAL IMPRESSION: Patient tolerated treatment with no significant increases in pain with progressions in BL LE loading via isolated and low load exercises. Current deficits include: excessive pain, ROM, strength, functional activity tolerance. As a result, patient would continue to benefit from skilled PT to address said deficits via plan below.     EVAL: Patient is a 58 y.o. F who was seen today for physical therapy evaluation and treatment for chronic bilateral knee pain. Pt has complex PMH and is well known to therapist, with physical findings consistent with physician impression as pt demonstrates significant decrease in bilateral knee strength and ROM. Functional mobility deficits observed in setting of 30 Second Sit to  Stand and general transfer ability. LEFS score shows severe disability in performance of home ADLs and higher level community activities. Pt would benefit from skilled PT services working on improving LE strength and mobility in order to decrease pain and improve function.   OBJECTIVE IMPAIRMENTS: Abnormal gait, decreased activity tolerance, decreased balance, decreased endurance, decreased mobility, difficulty walking, decreased ROM, decreased strength, postural dysfunction, obesity, and pain  ACTIVITY LIMITATIONS: carrying, lifting, bending, sitting, standing, squatting, sleeping, stairs, transfers, and locomotion level  PARTICIPATION LIMITATIONS: meal prep, cleaning, driving, shopping, community activity, occupation, and yard work  PERSONAL FACTORS: Time since onset of injury/illness/exacerbation and 3+ comorbidities: DM II, Neuropathy, HTN, CHF are also affecting patient's functional outcome.   REHAB POTENTIAL: Fair - complicated by PMH and chronicity of condition  CLINICAL DECISION MAKING: Evolving/moderate complexity  EVALUATION COMPLEXITY: Moderate   GOALS: Goals reviewed with patient? No  SHORT TERM GOALS: Target date: 01/24/2024   Pt will be compliant and knowledgeable with initial HEP for improved comfort and carryover Baseline: initial HEP given  01/21/24: has been I too much pain Goal status: ONGOING  2.  Pt will self report bilateral knee pain no greater than 8/10 for improved comfort and functional ability Baseline: 10/10 at worst Goal status: INITIAL   LONG TERM GOALS: Target date: 02/28/2024   Pt will improve LEFS to no less than 25/80 as proxy for functional improvement with home ADLs and higher level community activity Baseline: 13/80 Goal status: INITIAL  2.  Pt will self report bilateral knee pain no greater than 5/10 for improved comfort and functional  ability Baseline: 10/10 at worst Goal status: INITIAL   3.  Pt will increase 30 Second Sit to Stand rep  count to no less than 9 reps for improved balance, strength, and functional mobility Baseline: 6 reps with UE Goal status: INITIAL   4.  Pt will improve bilateral knee ext to at least 5 degrees to full extension for improved comfort and functional mobility Baseline: see chart Goal status: INITIAL  5.  Pt will improve bilateral LE MMT to no less than 3+/5 for improved comfort and functional mobility Baseline:  see chart Goal status: INITIAL    PLAN:  PT FREQUENCY: 1-2x/week  PT DURATION: 8 weeks  PLANNED INTERVENTIONS: 97164- PT Re-evaluation, 97110-Therapeutic exercises, 97530- Therapeutic activity, [id number]- Neuromuscular re-education, 97535- Self Care, [id number]- Manual therapy, [id number]- Gait training, [phone]- Aquatic Therapy, [phone]- Electrical stimulation (unattended), [phone]- Electrical stimulation (manual), 20560 (1-2 muscles), 20561 (3+ muscles)- Dry Needling, and Patient/Family education  PLAN FOR NEXT SESSION: assess HEP response, LE strengthening, knee ROM  For all possible CPT codes, reference the Planned Interventions line above.     Check all conditions that are expected to impact treatment: {Conditions expected to impact treatment:Respiratory disorders, Diabetes mellitus, and Musculoskeletal disorders   If treatment provided at initial evaluation, no treatment charged due to lack of authorization.      [place] Odessia Scot  PT, DPT

## 2024-02-05 ENCOUNTER — Ambulatory Visit

## 2024-02-19 ENCOUNTER — Ambulatory Visit

## 2024-02-21 ENCOUNTER — Other Ambulatory Visit: Payer: Self-pay

## 2024-02-21 DIAGNOSIS — F419 Anxiety disorder, unspecified: Secondary | ICD-10-CM

## 2024-02-21 DIAGNOSIS — M170 Bilateral primary osteoarthritis of knee: Secondary | ICD-10-CM

## 2024-02-21 MED ORDER — DULOXETINE HCL 60 MG PO CPEP: 60.0000 mg | ORAL_CAPSULE | Freq: Every day | ORAL | 3 refills | Status: AC

## 2024-02-21 MED ORDER — CYCLOBENZAPRINE HCL 10 MG PO TABS: 10.0000 mg | ORAL_TABLET | Freq: Two times a day (BID) | ORAL | 2 refills | Status: AC | PRN

## 2024-02-24 ENCOUNTER — Telehealth: Payer: Self-pay | Admitting: Oncology

## 2024-02-24 ENCOUNTER — Other Ambulatory Visit: Attending: Oncology

## 2024-02-24 ENCOUNTER — Ambulatory Visit: Admitting: Oncology

## 2024-02-24 NOTE — Telephone Encounter (Signed)
 PT callede to reschedule appt, day and time confirmed.

## 2024-02-26 ENCOUNTER — Telehealth: Payer: Self-pay | Admitting: Oncology

## 2024-02-26 NOTE — Telephone Encounter (Signed)
 Called PT to reschedule appt; grandson has dentist appt;day and time confirmed.

## 2024-03-02 ENCOUNTER — Inpatient Hospital Stay

## 2024-03-02 ENCOUNTER — Inpatient Hospital Stay: Admitting: Oncology

## 2024-03-03 ENCOUNTER — Other Ambulatory Visit: Payer: Self-pay

## 2024-03-03 ENCOUNTER — Telehealth: Payer: Self-pay

## 2024-03-03 DIAGNOSIS — M170 Bilateral primary osteoarthritis of knee: Secondary | ICD-10-CM

## 2024-03-03 DIAGNOSIS — R1033 Periumbilical pain: Secondary | ICD-10-CM

## 2024-03-03 NOTE — Telephone Encounter (Signed)
 Patient calls nurse line regarding prescription refills.   Tramadol  pended in separate encounter. Patient is also asking for refill on fluconazole . She reports that she has developed a yeast infection.   Patient states that for the past two days, she has been experiencing vaginal itching/irritation and vaginal discharge. Denies painful urination, pelvic/abdominal pain, fever and chills.   If possible, she is requesting that provider send this in within her coming in for an appointment.   She states that she has a lot of upcoming appointments and plans to follow up with PCP in the beginning of the new year.   Will forward request to Dr. Elicia.   Chiquita JAYSON English, RN

## 2024-03-04 ENCOUNTER — Ambulatory Visit: Admitting: Physical Therapy

## 2024-03-04 DIAGNOSIS — M6281 Muscle weakness (generalized): Secondary | ICD-10-CM | POA: Insufficient documentation

## 2024-03-04 DIAGNOSIS — G8929 Other chronic pain: Secondary | ICD-10-CM | POA: Insufficient documentation

## 2024-03-04 DIAGNOSIS — M25551 Pain in right hip: Secondary | ICD-10-CM | POA: Diagnosis present

## 2024-03-04 DIAGNOSIS — M25562 Pain in left knee: Secondary | ICD-10-CM | POA: Diagnosis present

## 2024-03-04 DIAGNOSIS — R600 Localized edema: Secondary | ICD-10-CM | POA: Insufficient documentation

## 2024-03-04 DIAGNOSIS — M25561 Pain in right knee: Secondary | ICD-10-CM | POA: Diagnosis present

## 2024-03-04 DIAGNOSIS — R2689 Other abnormalities of gait and mobility: Secondary | ICD-10-CM | POA: Diagnosis present

## 2024-03-04 MED ORDER — TRAMADOL HCL 50 MG PO TABS: 50.0000 mg | ORAL_TABLET | Freq: Two times a day (BID) | ORAL | 0 refills | Status: DC | PRN

## 2024-03-04 NOTE — Therapy (Signed)
 OUTPATIENT PHYSICAL THERAPY LOWER EXTREMITY TREATMENT   Patient Name: Rita Bass MRN: [id number] DOB:[birth year], 58 y.o., female Today's Date: 03/05/2024  END OF SESSION:  PT End of Session - 03/04/24 1223     Visit Number 4    Number of Visits 17    Date for Recertification  05/05/24    Authorization Type [employer]    Progress Note Due on Visit 14    PT Start Time 1222    PT Stop Time 1300    PT Time Calculation (min) 38 min            Past Medical History:  Diagnosis Date   Arthritis    Asthma    Chronic pain of left knee    Complication of anesthesia    PONV   Diabetes (HCC)    GERD (gastroesophageal reflux disease)    H/O bronchitis    Hepatic steatosis    Internal hemorrhoids    Kidney stones    Obesity    Renal disorder    Shortness of breath dyspnea    Sleep apnea    past issues - at weight over 400lbs   Sleep apnea in adult    Polysomnogram pending.  Followed by Dr. Shellia   Past Surgical History:  Procedure Laterality Date   BIOPSY  11/01/2020   Procedure: BIOPSY;  Surgeon: San Sandor GAILS, DO;  Location: [facility];  Service: Gastroenterology;;   BIOPSY  05/11/2021   Procedure: BIOPSY;  Surgeon: San Sandor GAILS, DO;  Location: [facility];  Service: Gastroenterology;;   BIOPSY  11/29/2022   Procedure: BIOPSY;  Surgeon: San Sandor GAILS, DO;  Location: [facility];  Service: Gastroenterology;;   CESAREAN SECTION     6 c-sections   CHOLECYSTECTOMY     COLONOSCOPY WITH PROPOFOL  N/A 05/11/2021   Procedure: COLONOSCOPY WITH PROPOFOL ;  Surgeon: San Sandor GAILS, DO;  Location: [facility];  Service: Gastroenterology;  Laterality: N/A;   ESOPHAGOGASTRODUODENOSCOPY (EGD) WITH PROPOFOL  N/A 11/01/2020   Procedure: ESOPHAGOGASTRODUODENOSCOPY (EGD) WITH PROPOFOL ;  Surgeon: San Sandor GAILS, DO;  Location: [facility];  Service: Gastroenterology;  Laterality: N/A;   ESOPHAGOGASTRODUODENOSCOPY (EGD) WITH PROPOFOL  N/A 11/29/2022    Procedure: ESOPHAGOGASTRODUODENOSCOPY (EGD) WITH PROPOFOL ;  Surgeon: San Sandor GAILS, DO;  Location: [facility];  Service: Gastroenterology;  Laterality: N/A;   HERNIA REPAIR     KIDNEY STONE SURGERY     LEFT HEART CATH AND CORONARY ANGIOGRAPHY N/A 11/05/2023   Procedure: LEFT HEART CATH AND CORONARY ANGIOGRAPHY;  Surgeon: Anner Alm ORN, MD;  Location: [facility] INVASIVE CV LAB;  Service: Cardiovascular;  Laterality: N/A;   OOPHORECTOMY     TRANSTHORACIC ECHOCARDIOGRAM  09/2017   Technically difficult study.  Did not use Definity  contrast.  EF was 55 to 60% with moderate LVH and grade 1 diastolic dysfunction.  No significant valvular lesions noted.  No regional wall motion normality but difficult to assess due to poor imaging.    TUBAL LIGATION     UPPER GASTROINTESTINAL ENDOSCOPY     Patient Active Problem List   Diagnosis Date Noted   History of deep venous thrombosis (DVT) of distal vein of left lower extremity 11/25/2023   Palpitations 06/27/2023   Coronary artery disease involving native coronary artery of native heart without angina pectoris 06/27/2023   Sleep disturbance 06/20/2023   Shortness of breath 03/18/2023   Chronic abdominal pain 12/24/2022   Medication management 12/21/2022   Chronic Helicobacter pylori gastritis 11/29/2022   Gastric ulcer without hemorrhage  or perforation 11/29/2022   Gastroesophageal reflux disease with esophagitis without hemorrhage 11/29/2022   Change in bowel habits 11/29/2022   Diarrhea 11/29/2022   Asthma 10/22/2022   Primary osteoarthritis of both knees 10/03/2022   Type 2 diabetes mellitus with diabetic neuropathy, unspecified (HCC) 04/30/2022   Chronic osteoarthritis 04/30/2022   Rash 01/15/2022   External hemorrhoid 09/15/2021   Ventral hernia 08/16/2021   Allergic conjunctivitis 07/28/2021   Grade II internal hemorrhoids    H. pylori infection 11/08/2020   Gastritis and gastroduodenitis    Lung nodule 10/26/2020   Ventral hernia  without obstruction or gangrene 10/04/2020   Chronic diarrhea 09/16/2020   Abnormal Pap smear of cervix 08/18/2020   Hyperlipidemia associated with type 2 diabetes mellitus (HCC) 06/21/2020   Right hip pain 06/21/2020   Orthopnea 01/04/2020   Chronic cough 12/23/2019   Dyspepsia 05/22/2019   Primary hypertension 04/07/2019   Vaginal bleeding 01/27/2019   Low back pain 01/27/2019   BMI 50.0-59.9, adult (HCC) 12/05/2017   Diastolic dysfunction with heart failure (HCC) 10/25/2017   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   Diabetic nephropathy associated with type 2 diabetes mellitus (HCC) 10/25/2017   Type 2 diabetes mellitus with complication (HCC) 09/13/2017   Sleep apnea 08/16/2017   Chronic pain of right knee 07/13/2016   Morbid obesity (HCC) 02/04/2015   Menorrhagia with regular cycle 11/18/2014    PCP: [name], MD  REFERRING PROVIDER: McDiarmid, Krystal BIRCH, MD   REFERRING DIAG: M17.0 (ICD-10-CM) - Primary osteoarthritis of both knees   THERAPY DIAG:  Other abnormalities of gait and mobility  Chronic pain of right knee  Chronic pain of left knee  Muscle weakness (generalized)  Pain in right hip  Localized edema  Rationale for Evaluation and Treatment: Rehabilitation  ONSET DATE: Chronic  SUBJECTIVE:   SUBJECTIVE STATEMENT: Pt presents for Re-evaluation after hiatus from previously established PT POC. Pt states that she has noticed increased paraesthesia in LLE-foot yesterday, has improved today. Has been donning compression. Pt notes inc pain in BLE from previous session. Uses motor cart at the grocery store. Pt reports instability and cavitations in bilateral knees. Pt internally rotates RLE with transfers to improve pain and stability. Pt states that symptoms range in intensity from 4/10-10/10 depending on activity or position. Has been using shower chair, has inc difficulty getting into the shower.   EVAL: Pt presents to PT with reports of chronic bilateral knee pain with  recent R>L. Pt is well known to PT, has complex PMH in setting of cardiovascular system. Recent concern for L DVT, awaiting results but they sent her home from ED on 12/16/2023. Also waiting until stable to address H. Pylori at [facility] in a few months. Lateral knee pain bilaterally, pain with transfers and overall mobility.   PERTINENT HISTORY: DM II, Neuropathy, HTN, CHF  PAIN:  6C Are you having pain?  Yes: NPRS scale: 4-5/10 Worst: 10/10 Pain location: bilateral knees Pain description: sharp, burning  Aggravating factors: standing, transfers, walking Relieving factors: rest, medication  PRECAUTIONS: Fall  RED FLAGS: None   WEIGHT BEARING RESTRICTIONS: No  FALLS:  Has patient fallen in last 6 months? Yes. Number of falls numerous - no falls to floor but has fallen backwards on bed or chair with transfers  LIVING ENVIRONMENT: Lives with: lives with their daughter Lives in: House/apartment Stairs: Yes: External: 3 steps; on right going up Has following equipment at home: None  OCCUPATION: Not working  PLOF: Independent with basic ADLs  PATIENT GOALS: decrease  knee pain, be able to move better  OBJECTIVE:  Note: Objective measures were completed at Evaluation unless otherwise noted.  DIAGNOSTIC FINDINGS: see imaging   PATIENT SURVEYS:  LEFS  Extreme difficulty/unable (0), Quite a bit of difficulty (1), Moderate difficulty (2), Little difficulty (3), No difficulty (4) Survey date:  01/06/2024 03/04/24  Any of your usual work, housework or school activities 0 1  2. Usual hobbies, recreational or sporting activities 0 1  3. Getting into/out of the bath 0 1  4. Walking between rooms 1 2  5. Putting on socks/shoes 1 3  6. Squatting  0 0  7. Lifting an object, like a bag of groceries from the floor 3 2  8. Performing light activities around your home 3 2  9. Performing heavy activities around your home 0 0  10. Getting into/out of a car 0 1  11. Walking 2 blocks  0 0  12. Walking 1 mile 0 0  13. Going up/down 10 stairs (1 flight) 1 0  14. Standing for 1 hour 0 0  15.  sitting for 1 hour 2 0  16. Running on even ground 0 0  17. Running on uneven ground 0 0  18. Making sharp turns while running fast 0 0  19. Hopping  0 0  20. Rolling over in bed 2 2  Score total:  13/80 15/80     COGNITION: Overall cognitive status: Within functional limits for tasks assessed     SENSATION: WFL  POSTURE: rounded shoulders, forward head, increased lumbar lordosis, and large body habitus  PALPATION: TTP to distal R quad, patellar hypomobility  LOWER EXTREMITY ROM:  Active ROM Right eval Left eval Right 03/04/24 Left 03/04/24  Hip flexion      Hip extension      Hip abduction      Hip adduction      Hip internal rotation      Hip external rotation      Knee flexion 101 90 95 94  Knee extension 13 27 -10 -28  Ankle dorsiflexion      Ankle plantarflexion      Ankle inversion      Ankle eversion       (Blank rows = not tested)  LOWER EXTREMITY MMT:  MMT Right eval Left eval Right 03/04/24 Left 03/04/24  Hip flexion 3+ 3+ 3+/5 4-/5  Hip extension      Hip abduction 3+ 3+ 3+/5 3+/5  Hip adduction      Hip internal rotation      Hip external rotation      Knee flexion 2+ 2+ 3-/5 3/5  Knee extension 2+ 2+ 3-/5 3-/5  Ankle dorsiflexion      Ankle plantarflexion      Ankle inversion      Ankle eversion       (Blank rows = not tested)  LOWER EXTREMITY SPECIAL TESTS:  DNT  FUNCTIONAL TESTS:  30 Second Sit to Stand: 6 reps with UE  03/04/24: 9 reps with UE use, decreased upright positioning as reps progress  GAIT: Distance walked: 30ft Assistive device utilized: None Level of assistance: SBA Comments: antalgic gait bilaterally, decreased gait speed, dec stride length   TREATMENT:   03/04/24  re-eval  HEP reviewed and modified to reflect current status, quad set, hamstring stretch, LAQ, weight shifting, inc cues for technique  and completion, reports inc pain in R knee following quad set in sitting, begins to resolve with cessation  *Patient  required extra time for exercises due to increased monitoring, reassessment, and rest due to low activity tolerance and/or high irritability of symptoms* *Patient required extra time for exercises due to additional reeducation on pain science, optimal loading, prognosis, and relevant tissues/anatomy*  Therapeutic Exercise 02/04/24 Reassessment and update for update Seated LAQ x6 BL (did not tolerate) Supine SAQ 2x8x3s BL Seated heel slides x6 (working within pain free ROM) Seated marches x6x3s Seated calf raises x6x3s  Manual Therapy: Mcconnell tape medial pull to patella right knee  L knee Pfps taping ([email or web address])      [facility] Adult PT Treatment:                                                DATE: 01/21/24 Therapeutic Exercise: LAQ 10 x 3 each  Seated March 10 x 2 each  Seated Calf stretch with towel x 3 each  Manual Therapy: [email or web address]  Right knee  Manual knee mobs left knee for flexion and extension  Mcconnell tape medial pull to patella right knee   Self Care: Pt given information on KT tape and Leuko tape and how to purchase on [email or web address] Pt given instructions on how to apply KT tape by watching You tube video listed above.    [facility] Adult PT Treatment:                                                DATE: 01/03/2024 Therapeutic Exercise: Seated QS x 5 - 5 hold Seated hamstring stretch x 30 each Seated LAQ x 5 each  PATIENT EDUCATION:  Education details: eval findings, LEFS, HEP, POC Person educated: Patient Education method: Explanation, Demonstration, and Handouts Education comprehension: verbalized understanding and returned demonstration  HOME EXERCISE PROGRAM: Access Code: [email or web address] URL: [email or web address] Date: 03/04/2024 Prepared by: Stann Ohara  Exercises - Seated Quad Set  - 2 x daily - 4 x weekly - 3 sets - 15 reps - 2sec hold - Seated Hamstring Stretch  - 1 x daily - 4 x weekly - 3 reps - 30 sec hold - Seated Long Arc Quad  - 1 x daily - 4 x weekly - 3 sets - 10 reps - 2 sec hold - Side to Side Weight Shift with Counter Support  - 1 x daily - 4 x weekly - 1 sets - 30 reps - 2 sec hold  ASSESSMENT:  CLINICAL IMPRESSION: Pt presents for PT re-evaluation due to lapse in POC. She reports continued instability at home but maintains safety awareness with transfers and if she loses her balance, states that it is back into the chair or bed. Pt has 2 point improvement in LEFS, ROM maintained. Pt has inc deficits on RLE vs LLE with pain in the lateral aspect of the joint line after completing knee extension, begins to resolve with cessation. HEP reviewed and updated to include weight shifting for cyclic loading of BLE.      EVAL: Patient is a 58 y.o. F who was seen today for physical therapy evaluation and treatment for chronic bilateral knee pain. Pt has complex PMH and is well known to therapist, with physical findings consistent with physician impression as pt demonstrates significant decrease in bilateral knee strength and ROM. Functional mobility  deficits observed in setting of 30 Second Sit to Stand and general transfer ability. LEFS score shows severe disability in performance of home ADLs and higher level community activities. Pt would benefit from skilled PT services working on improving LE strength and mobility in order to decrease pain and improve function.   OBJECTIVE IMPAIRMENTS: Abnormal gait, decreased activity tolerance, decreased balance, decreased endurance, decreased mobility, difficulty walking, decreased ROM, decreased strength, postural dysfunction, obesity, and pain  ACTIVITY LIMITATIONS: carrying, lifting, bending, sitting, standing, squatting, sleeping, stairs, transfers, and locomotion level  PARTICIPATION  LIMITATIONS: meal prep, cleaning, driving, shopping, community activity, occupation, and yard work  PERSONAL FACTORS: Time since onset of injury/illness/exacerbation and 3+ comorbidities: DM II, Neuropathy, HTN, CHF are also affecting patient's functional outcome.   REHAB POTENTIAL: Fair - complicated by PMH and chronicity of condition  CLINICAL DECISION MAKING: Evolving/moderate complexity  EVALUATION COMPLEXITY: Moderate   GOALS: Goals reviewed with patient? No  SHORT TERM GOALS: Target date: 04/04/2024   Pt will be compliant and knowledgeable with initial HEP for improved comfort and carryover Baseline: initial HEP given  01/21/24: has been I too much pain Goal status: ONGOING 03/04/24: requires inc cues for HEP recall  2.  Pt will self report bilateral knee pain no greater than 8/10 for improved comfort and functional ability Baseline: 10/10 at worst Goal status: IN PROGRESS   LONG TERM GOALS: Target date: 05/05/2024   Pt will improve LEFS to no less than 25/80 as proxy for functional improvement with home ADLs and higher level community activity Baseline: 13/80 03/04/24: 15/80 Goal status: IN PROGRESS  2.  Pt will self report bilateral knee pain no greater than 5/10 for improved comfort and functional ability Baseline: 10/10 at worst Goal status: IN PROGRESS   3.  Pt will increase 30 Second Sit to Stand rep count to no less than 9 reps for improved balance, strength, and functional mobility Baseline: 6 reps with UE 03/04/24: limited clearance upright positioning with fatigue Goal status: IN PROGRESS   4.  Pt will improve bilateral knee ext to at least 5 degrees to full extension for improved comfort and functional mobility Baseline: see chart Goal status: IN PROGRESS  5.  Pt will improve bilateral LE MMT to no less than 3+/5 for improved comfort and functional mobility Baseline:  see chart Goal status: IN PROGRESS    PLAN:  PT FREQUENCY: 1-2x/week  PT  DURATION: 8 weeks  PLANNED INTERVENTIONS: 97164- PT Re-evaluation, 97110-Therapeutic exercises, 97530- Therapeutic activity, [id number]- Neuromuscular re-education, 97535- Self Care, [id number]- Manual therapy, [id number]- Gait training, [id number]- Aquatic Therapy, [phone]- Electrical stimulation (unattended), [phone]- Electrical stimulation (manual), 20560 (1-2 muscles), 20561 (3+ muscles)- Dry Needling, and Patient/Family education  PLAN FOR NEXT SESSION: assess HEP response, LE strengthening, knee ROM  For all possible CPT codes, reference the Planned Interventions line above.     Check all conditions that are expected to impact treatment: {Conditions expected to impact treatment:Respiratory disorders, Diabetes mellitus, and Musculoskeletal disorders   If treatment provided at initial evaluation, no treatment charged due to lack of authorization.      Stann Ohara PT, DPT, CLT, CES 03/05/2024 2:06 PM

## 2024-03-06 ENCOUNTER — Telehealth: Payer: Self-pay

## 2024-03-06 ENCOUNTER — Encounter (HOSPITAL_COMMUNITY): Payer: Self-pay | Admitting: Gastroenterology

## 2024-03-06 DIAGNOSIS — G8929 Other chronic pain: Secondary | ICD-10-CM

## 2024-03-06 MED ORDER — FLUCONAZOLE 150 MG PO TABS: 150.0000 mg | ORAL_TABLET | Freq: Once | ORAL | 0 refills | Status: AC

## 2024-03-06 NOTE — Progress Notes (Signed)
 Attempted to obtain medical history for pre op call via telephone, unable to reach at this time. HIPAA compliant voicemail message left requesting return call to pre surgical testing department.

## 2024-03-06 NOTE — Telephone Encounter (Signed)
 Called patient and advised of update. Patient appreciative.   Chiquita JAYSON English, RN

## 2024-03-06 NOTE — Telephone Encounter (Signed)
 Spoke with patient regarding PT re-certification from 03/04/24.   She advised that she discussed with PT possibly adding therapy for her shoulders due to arthritis.   Patient was unsure if she would need a new referral or what this process would be.   Advised patient that I would reach out to PT to determine what is needed.   Please advise.   Chiquita JAYSON English, RN

## 2024-03-06 NOTE — Telephone Encounter (Signed)
 Procedure:EGD Procedure date: 03/17/24 Procedure location: [facility] Arrival Time: 6:45 Spoke with the patient Y/N: Y Any prep concerns? N Has the patient obtained the prep from the pharmacy ? N Do you have a care partner and transportation: Y Any additional concerns? N

## 2024-03-10 ENCOUNTER — Inpatient Hospital Stay: Admitting: Oncology

## 2024-03-10 ENCOUNTER — Telehealth: Payer: Self-pay

## 2024-03-10 ENCOUNTER — Inpatient Hospital Stay: Attending: Oncology

## 2024-03-10 NOTE — Telephone Encounter (Signed)
 Followed up on missed hematology appointment with Dr. Autumn along with lab work. Left a voicemail.

## 2024-03-16 NOTE — Anesthesia Preprocedure Evaluation (Signed)
"                                    Anesthesia Evaluation  Patient identified by MRN, date of birth, ID band Patient awake    Reviewed: Allergy & Precautions, NPO status , Patient's Chart, lab work & pertinent test results  History of Anesthesia Complications Negative for: history of anesthetic complications  Airway Mallampati: III  TM Distance: >3 FB Neck ROM: Full    Dental no notable dental hx. (+) Teeth Intact, Dental Advisory Given   Pulmonary asthma , sleep apnea    Pulmonary exam normal breath sounds clear to auscultation       Cardiovascular hypertension, + CAD and + DVT  Normal cardiovascular exam+ dysrhythmias Supra Ventricular Tachycardia  Rhythm:Regular Rate:Normal  03/2023 TTE  1. Images are very limited.   2. Left ventricular ejection fraction, by estimation, is 55 to 60%. The  left ventricle has normal function. Left ventricular endocardial border  not optimally defined to evaluate regional wall motion. Left ventricular  diastolic parameters are consistent  with Grade I diastolic dysfunction (impaired relaxation).   3. Definity  contrast does not demonstrate any obvious wall motion  abnormalities.   4. Right ventricular systolic function was not well visualized. The right  ventricular size is not well visualized. Tricuspid regurgitation signal is  inadequate for assessing PA pressure.   5. The mitral valve was not well visualized. Trivial mitral valve  regurgitation.   6. The aortic valve was not well visualized. Aortic valve regurgitation  is not visualized. Aortic valve mean gradient measures 3.0 mmHg.   7. Unable to estimate CVP.     Neuro/Psych    GI/Hepatic ,GERD  ,,  Endo/Other  diabetes, Type 2, Oral Hypoglycemic Agents  Class 3 obesity  Renal/GU      Musculoskeletal  (+) Arthritis ,    Abdominal   Peds  Hematology   Anesthesia Other Findings   Reproductive/Obstetrics                               Anesthesia Physical Anesthesia Plan  ASA: 3  Anesthesia Plan: MAC   Post-op Pain Management: Minimal or no pain anticipated   Induction:   PONV Risk Score and Plan: 2 and Propofol  infusion and Treatment may vary due to age or medical condition  Airway Management Planned: Natural Airway and Nasal Cannula  Additional Equipment: None  Intra-op Plan:   Post-operative Plan:   Informed Consent: I have reviewed the patients History and Physical, chart, labs and discussed the procedure including the risks, benefits and alternatives for the proposed anesthesia with the patient or authorized representative who has indicated his/her understanding and acceptance.     Dental advisory given  Plan Discussed with: CRNA  Anesthesia Plan Comments: (Diarrhea, gastric pain Hpylori for EGD)         Anesthesia Quick Evaluation  "

## 2024-03-16 NOTE — Progress Notes (Signed)
 Marval Norrine Darci Twyla MD  Cardiologist-Harding MD Pulmonologist-n/a  EKG-10/30/23 Echo-03/24/23 Cath-11/05/23        Stress-2019 ICD/PM-n/a GLP1-Ozempic  held since 12/21 Blood Thinner-Xarelto  (says been off a while over month was told didn't need?)  History:Asthma, CAD, DM, OSA, Diastolic HF, PSVT, DVT. Patient last saw cardiology 10/30/23  she was having some intermittent chest pains they recommended cath which was done 8/15, showed no blockages. Was scheduled for a f/u 11/12 but missed appt. She saw her pcp last 11/24. She denies any chest pains or cardiac issues, gets a little winded with a lot of walking. No equipment use. Anesthesia Review- Yes- okay to proceed

## 2024-03-17 ENCOUNTER — Encounter (HOSPITAL_COMMUNITY)
Admission: RE | Disposition: A | Discharge status: Home / Self Care | Payer: Self-pay | Source: Home / Self Care | Attending: Gastroenterology

## 2024-03-17 ENCOUNTER — Ambulatory Visit (HOSPITAL_COMMUNITY): Payer: Self-pay | Admitting: Anesthesiology

## 2024-03-17 ENCOUNTER — Ambulatory Visit (HOSPITAL_COMMUNITY)
Admission: RE | Admit: 2024-03-17 | Discharge: 2024-03-17 | Disposition: A | Discharge status: Home / Self Care | Attending: Gastroenterology | Admitting: Gastroenterology

## 2024-03-17 ENCOUNTER — Other Ambulatory Visit: Payer: Self-pay

## 2024-03-17 ENCOUNTER — Encounter (HOSPITAL_COMMUNITY): Payer: Self-pay | Admitting: Gastroenterology

## 2024-03-17 DIAGNOSIS — M1990 Unspecified osteoarthritis, unspecified site: Secondary | ICD-10-CM | POA: Diagnosis not present

## 2024-03-17 DIAGNOSIS — K219 Gastro-esophageal reflux disease without esophagitis: Secondary | ICD-10-CM | POA: Diagnosis not present

## 2024-03-17 DIAGNOSIS — E66813 Obesity, class 3: Secondary | ICD-10-CM | POA: Diagnosis not present

## 2024-03-17 DIAGNOSIS — R197 Diarrhea, unspecified: Secondary | ICD-10-CM | POA: Insufficient documentation

## 2024-03-17 DIAGNOSIS — Z7901 Long term (current) use of anticoagulants: Secondary | ICD-10-CM | POA: Diagnosis not present

## 2024-03-17 DIAGNOSIS — B9681 Helicobacter pylori [H. pylori] as the cause of diseases classified elsewhere: Secondary | ICD-10-CM | POA: Insufficient documentation

## 2024-03-17 DIAGNOSIS — E119 Type 2 diabetes mellitus without complications: Secondary | ICD-10-CM

## 2024-03-17 DIAGNOSIS — G4730 Sleep apnea, unspecified: Secondary | ICD-10-CM | POA: Diagnosis not present

## 2024-03-17 DIAGNOSIS — Z6841 Body Mass Index (BMI) 40.0 and over, adult: Secondary | ICD-10-CM | POA: Diagnosis not present

## 2024-03-17 DIAGNOSIS — I2510 Atherosclerotic heart disease of native coronary artery without angina pectoris: Secondary | ICD-10-CM | POA: Insufficient documentation

## 2024-03-17 DIAGNOSIS — I10 Essential (primary) hypertension: Secondary | ICD-10-CM | POA: Insufficient documentation

## 2024-03-17 DIAGNOSIS — R1013 Epigastric pain: Secondary | ICD-10-CM

## 2024-03-17 DIAGNOSIS — Z5982 Transportation insecurity: Secondary | ICD-10-CM | POA: Insufficient documentation

## 2024-03-17 DIAGNOSIS — K2950 Unspecified chronic gastritis without bleeding: Secondary | ICD-10-CM | POA: Insufficient documentation

## 2024-03-17 DIAGNOSIS — Z7984 Long term (current) use of oral hypoglycemic drugs: Secondary | ICD-10-CM | POA: Insufficient documentation

## 2024-03-17 DIAGNOSIS — Z86718 Personal history of other venous thrombosis and embolism: Secondary | ICD-10-CM | POA: Insufficient documentation

## 2024-03-17 DIAGNOSIS — Z7985 Long-term (current) use of injectable non-insulin antidiabetic drugs: Secondary | ICD-10-CM | POA: Insufficient documentation

## 2024-03-17 DIAGNOSIS — I471 Supraventricular tachycardia, unspecified: Secondary | ICD-10-CM | POA: Diagnosis not present

## 2024-03-17 HISTORY — PX: ESOPHAGOGASTRODUODENOSCOPY: SHX5428

## 2024-03-17 HISTORY — PX: BIOPSY OF SKIN SUBCUTANEOUS TISSUE AND/OR MUCOUS MEMBRANE: SHX6741

## 2024-03-17 LAB — GLUCOSE, CAPILLARY
Glucose-Capillary: 293 mg/dL — ABNORMAL HIGH (ref 70–99)
Glucose-Capillary: 286 mg/dL — ABNORMAL HIGH (ref 70–99)

## 2024-03-17 SURGERY — EGD (ESOPHAGOGASTRODUODENOSCOPY)
Anesthesia: Monitor Anesthesia Care

## 2024-03-17 MED ORDER — SODIUM CHLORIDE 0.9 % IV SOLN: INTRAVENOUS | Status: DC | PRN

## 2024-03-17 MED ORDER — FENTANYL CITRATE (PF) 100 MCG/2ML IJ SOLN
INTRAMUSCULAR | Status: DC | PRN
  Administered 2024-03-17: 50 ug via INTRAVENOUS

## 2024-03-17 MED ORDER — INSULIN ASPART 100 UNIT/ML IJ SOLN
INTRAMUSCULAR | Status: AC
  Filled 2024-03-17: qty 6

## 2024-03-17 MED ORDER — INSULIN ASPART 100 UNIT/ML IJ SOLN
6.0000 [IU] | Freq: Once | INTRAMUSCULAR | Status: AC
  Administered 2024-03-17: 6 [IU] via SUBCUTANEOUS

## 2024-03-17 MED ORDER — PROPOFOL 10 MG/ML IV BOLUS
INTRAVENOUS | Status: DC | PRN
  Administered 2024-03-17: 50 mg via INTRAVENOUS
  Administered 2024-03-17: 100 ug/kg/min via INTRAVENOUS
  Administered 2024-03-17: 50 mg via INTRAVENOUS

## 2024-03-17 MED ORDER — KETAMINE HCL 10 MG/ML IJ SOLN
INTRAMUSCULAR | Status: AC
  Filled 2024-03-17: qty 1

## 2024-03-17 MED ORDER — LIDOCAINE 2% (20 MG/ML) 5 ML SYRINGE
INTRAMUSCULAR | Status: DC | PRN
  Administered 2024-03-17: 100 mg via INTRAVENOUS

## 2024-03-17 MED ORDER — FENTANYL CITRATE (PF) 100 MCG/2ML IJ SOLN
INTRAMUSCULAR | Status: AC
  Filled 2024-03-17: qty 2

## 2024-03-17 MED ORDER — SODIUM CHLORIDE 0.9 % IV SOLN: INTRAVENOUS | Status: DC

## 2024-03-17 NOTE — Anesthesia Procedure Notes (Signed)
 Procedure Name: MAC Date/Time: 03/17/2024 8:15 AM  Performed by: Obadiah Reyes BROCKS, CRNAPre-anesthesia Checklist: Patient identified, Suction available, Patient being monitored and Timeout performed Oxygen Delivery Method: Simple face mask Preoxygenation: Pre-oxygenation with 100% oxygen

## 2024-03-17 NOTE — Anesthesia Postprocedure Evaluation (Signed)
"   Anesthesia Post Note  Patient: Joelle Casad Pfalzgraf  Procedure(s) Performed: EGD (ESOPHAGOGASTRODUODENOSCOPY) BIOPSY, SKIN, SUBCUTANEOUS TISSUE, OR MUCOUS MEMBRANE     Patient location during evaluation: Endoscopy Anesthesia Type: MAC Level of consciousness: awake and alert Pain management: pain level controlled Vital Signs Assessment: post-procedure vital signs reviewed and stable Respiratory status: spontaneous breathing, nonlabored ventilation, respiratory function stable and patient connected to nasal cannula oxygen Cardiovascular status: stable and blood pressure returned to baseline Postop Assessment: no apparent nausea or vomiting Anesthetic complications: no   No notable events documented.  Last Vitals:  Vitals:   03/17/24 0830 03/17/24 0840  BP: (!) 146/81 (!) 148/84  Pulse: 90 86  Resp: 18 20  Temp:    SpO2: 95% 94%    Last Pain:  Vitals:   03/17/24 0840  TempSrc:   PainSc: 0-No pain                 Garnette LABOR Lavan Imes      "

## 2024-03-17 NOTE — Transfer of Care (Signed)
 Immediate Anesthesia Transfer of Care Note  Patient: Claudina Casad Tieken  Procedure(s) Performed: EGD (ESOPHAGOGASTRODUODENOSCOPY) BIOPSY, SKIN, SUBCUTANEOUS TISSUE, OR MUCOUS MEMBRANE  Patient Location: PACU  Anesthesia Type:MAC  Level of Consciousness: awake, alert , and oriented  Airway & Oxygen Therapy: Patient Spontanous Breathing and Patient connected to face mask oxygen  Post-op Assessment: Report given to RN and Post -op Vital signs reviewed and unstable, Anesthesiologist notified  Post vital signs: Reviewed and stable  Last Vitals:  Vitals Value Taken Time  BP 134/78 03/17/24 08:25  Temp    Pulse 97 03/17/24 08:27  Resp 23 03/17/24 08:28  SpO2 98 % 03/17/24 08:27  Vitals shown include unfiled device data.  Last Pain:  Vitals:   03/17/24 0659  TempSrc: Temporal  PainSc: 2       Patients Stated Pain Goal: 2 (03/17/24 0659)  Complications: No notable events documented.

## 2024-03-17 NOTE — Discharge Instructions (Signed)

## 2024-03-17 NOTE — Interval H&P Note (Signed)
 History and Physical Interval Note:  03/17/2024 7:50 AM  Rita Bass  has presented today for surgery, with the diagnosis of Diarrhea R19.7, Epigastric pain R10.13, Chronic h-Pylori K29.50/ B96.81, BMI 50-59  Z68.43.  The various methods of treatment have been discussed with the patient and family. After consideration of risks, benefits and other options for treatment, the patient has consented to  Procedures: EGD (ESOPHAGOGASTRODUODENOSCOPY) (N/A) as a surgical intervention.  The patient's history has been reviewed, patient examined, no change in status, stable for surgery.  I have reviewed the patient's chart and labs.  Questions were answered to the patient's satisfaction.     Sandor GAILS Maxie Debose

## 2024-03-17 NOTE — Op Note (Signed)
 [facility] Patient Name: Rita Bass Procedure Date: 03/17/2024 MRN: [id number] Attending MD: Sandor Flatter , MD, [id number] Date of Birth: 11-19-1965 CSN: [id number] Age: 58 Admit Type: Outpatient Procedure:                Upper GI endoscopy Indications:              Epigastric abdominal pain, Follow-up of                            Helicobacter pylori, Diarrhea Providers:                Sandor Flatter, MD, Jacquelyn Jaci Pierce, RN,                            Corene Southgate, Technician Referring MD:              Medicines:                Monitored Anesthesia Care Complications:            No immediate complications. Estimated Blood Loss:     Estimated blood loss was minimal. Procedure:                Pre-Anesthesia Assessment:                           - Prior to the procedure, a History and Physical                            was performed, and patient medications and                            allergies were reviewed. The patient's tolerance of                            previous anesthesia was also reviewed. The risks                            and benefits of the procedure and the sedation                            options and risks were discussed with the patient.                            All questions were answered, and informed consent                            was obtained. Prior Anticoagulants: The patient has                            taken no anticoagulant or antiplatelet agents. ASA                            Grade Assessment: III - A patient with severe  systemic disease. After reviewing the risks and                            benefits, the patient was deemed in satisfactory                            condition to undergo the procedure.                           After obtaining informed consent, the endoscope was                            passed under direct vision. Throughout the                             procedure, the patient's blood pressure, pulse, and                            oxygen saturations were monitored continuously. The                            [id number]-H190 ([id number]) Olympus endoscope was introduced                            through the mouth, and advanced to the second part                            of duodenum. The upper GI endoscopy was                            accomplished without difficulty. The patient                            tolerated the procedure well. Scope In: Scope Out: Findings:      The examined esophagus was normal.      The Z-line was regular and was found 37 cm from the incisors.      The entire examined stomach was normal. Biopsies were taken with a cold       forceps for Helicobacter pylori testing. Estimated blood loss was       minimal.      The examined duodenum was slightly edematous appearing, but no areas of       mucosal erosions or ulceration noted. Biopsies were taken with a cold       forceps for histology. Estimated blood loss was minimal. Impression:               - Normal esophagus.                           - Z-line regular, 37 cm from the incisors.                           - Normal stomach. Biopsied.                           -  The duodenum was slightly edematous appearing,                            but no areas of mucosal erosions or ulceration                            noted. Biopsied. Moderate Sedation:      Not Applicable - Patient had care per Anesthesia. Recommendation:           - Patient has a contact number available for                            emergencies. The signs and symptoms of potential                            delayed complications were discussed with the                            patient. Return to normal activities tomorrow.                            Written discharge instructions were provided to the                            patient.                           - Resume previous diet.                            - Continue present medications.                           - Await pathology results.                           - Return to GI clinic PRN. Procedure Code(s):        --- Professional ---                           [phone], Esophagogastroduodenoscopy, flexible,                            transoral; with biopsy, single or multiple Diagnosis Code(s):        --- Professional ---                           R10.13, Epigastric pain                           B96.81, Helicobacter pylori [H. pylori] as the                            cause of diseases classified elsewhere                           R19.7, Diarrhea, unspecified CPT copyright  2022 American Medical Association. All rights reserved. The codes documented in this report are preliminary and upon coder review may  be revised to meet current compliance requirements. Sandor Flatter, MD 03/17/2024 8:32:40 AM Number of Addenda: 0
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
## 2024-03-18 ENCOUNTER — Ambulatory Visit

## 2024-03-18 ENCOUNTER — Ambulatory Visit: Payer: Self-pay | Admitting: Gastroenterology

## 2024-03-18 DIAGNOSIS — B9681 Helicobacter pylori [H. pylori] as the cause of diseases classified elsewhere: Secondary | ICD-10-CM

## 2024-03-18 LAB — SURGICAL PATHOLOGY

## 2024-03-19 ENCOUNTER — Encounter (HOSPITAL_COMMUNITY): Payer: Self-pay | Admitting: Gastroenterology

## 2024-03-24 ENCOUNTER — Ambulatory Visit: Attending: Family Medicine

## 2024-03-24 DIAGNOSIS — M25511 Pain in right shoulder: Secondary | ICD-10-CM | POA: Diagnosis present

## 2024-03-24 DIAGNOSIS — M25561 Pain in right knee: Secondary | ICD-10-CM | POA: Insufficient documentation

## 2024-03-24 DIAGNOSIS — M25512 Pain in left shoulder: Secondary | ICD-10-CM | POA: Diagnosis present

## 2024-03-24 DIAGNOSIS — M25562 Pain in left knee: Secondary | ICD-10-CM | POA: Diagnosis present

## 2024-03-24 DIAGNOSIS — G8929 Other chronic pain: Secondary | ICD-10-CM | POA: Diagnosis present

## 2024-03-24 DIAGNOSIS — R2689 Other abnormalities of gait and mobility: Secondary | ICD-10-CM | POA: Diagnosis present

## 2024-03-24 DIAGNOSIS — M6281 Muscle weakness (generalized): Secondary | ICD-10-CM | POA: Insufficient documentation

## 2024-03-24 NOTE — Therapy (Signed)
 " OUTPATIENT PHYSICAL THERAPY LOWER EXTREMITY TREATMENT   Patient Name: Dawnell Bryant MRN: [id number] DOB:02/28/1966, 59 y.o., female Today's Date: 03/24/2024  END OF SESSION:  PT End of Session - 03/24/24 1535     Visit Number 5    Number of Visits 17    Date for Recertification  05/05/24    Authorization Type [employer]    Progress Note Due on Visit 14    PT Start Time 1530    PT Stop Time 1610    PT Time Calculation (min) 40 min            Past Medical History:  Diagnosis Date   Arthritis    Asthma    Chronic pain of left knee    Complication of anesthesia    PONV   Diabetes (HCC)    GERD (gastroesophageal reflux disease)    H/O bronchitis    Hepatic steatosis    Internal hemorrhoids    Kidney stones    Obesity    Renal disorder    Shortness of breath dyspnea    Sleep apnea    past issues - at weight over 400lbs   Sleep apnea in adult    Polysomnogram pending.  Followed by Dr. Shellia   Past Surgical History:  Procedure Laterality Date   BIOPSY  11/01/2020   Procedure: BIOPSY;  Surgeon: San Sandor GAILS, DO;  Location: [facility];  Service: Gastroenterology;;   BIOPSY  05/11/2021   Procedure: BIOPSY;  Surgeon: San Sandor GAILS, DO;  Location: [facility];  Service: Gastroenterology;;   BIOPSY  11/29/2022   Procedure: BIOPSY;  Surgeon: San Sandor GAILS, DO;  Location: [facility];  Service: Gastroenterology;;   BIOPSY OF SKIN SUBCUTANEOUS TISSUE AND/OR MUCOUS MEMBRANE  03/17/2024   Procedure: BIOPSY, SKIN, SUBCUTANEOUS TISSUE, OR MUCOUS MEMBRANE;  Surgeon: San Sandor GAILS, DO;  Location: [facility];  Service: Gastroenterology;;   CESAREAN SECTION     6 c-sections   CHOLECYSTECTOMY     COLONOSCOPY WITH PROPOFOL  N/A 05/11/2021   Procedure: COLONOSCOPY WITH PROPOFOL ;  Surgeon: San Sandor GAILS, DO;  Location: [facility];  Service: Gastroenterology;  Laterality: N/A;   ESOPHAGOGASTRODUODENOSCOPY N/A 03/17/2024   Procedure: EGD  (ESOPHAGOGASTRODUODENOSCOPY);  Surgeon: San Sandor GAILS, DO;  Location: [facility];  Service: Gastroenterology;  Laterality: N/A;   ESOPHAGOGASTRODUODENOSCOPY (EGD) WITH PROPOFOL  N/A 11/01/2020   Procedure: ESOPHAGOGASTRODUODENOSCOPY (EGD) WITH PROPOFOL ;  Surgeon: San Sandor GAILS, DO;  Location: [facility];  Service: Gastroenterology;  Laterality: N/A;   ESOPHAGOGASTRODUODENOSCOPY (EGD) WITH PROPOFOL  N/A 11/29/2022   Procedure: ESOPHAGOGASTRODUODENOSCOPY (EGD) WITH PROPOFOL ;  Surgeon: San Sandor GAILS, DO;  Location: [facility];  Service: Gastroenterology;  Laterality: N/A;   HERNIA REPAIR     KIDNEY STONE SURGERY     LEFT HEART CATH AND CORONARY ANGIOGRAPHY N/A 11/05/2023   Procedure: LEFT HEART CATH AND CORONARY ANGIOGRAPHY;  Surgeon: Anner Alm ORN, MD;  Location: [facility];  Service: Cardiovascular;  Laterality: N/A;   OOPHORECTOMY     TRANSTHORACIC ECHOCARDIOGRAM  09/2017   Technically difficult study.  Did not use Definity  contrast.  EF was 55 to 60% with moderate LVH and grade 1 diastolic dysfunction.  No significant valvular lesions noted.  No regional wall motion normality but difficult to assess due to poor imaging.    TUBAL LIGATION     UPPER GASTROINTESTINAL ENDOSCOPY     Patient Active Problem List   Diagnosis Date Noted   Abdominal pain, epigastric 03/17/2024   History  of deep venous thrombosis (DVT) of distal vein of left lower extremity 11/25/2023   Palpitations 06/27/2023   Coronary artery disease involving native coronary artery of native heart without angina pectoris 06/27/2023   Sleep disturbance 06/20/2023   Shortness of breath 03/18/2023   Chronic abdominal pain 12/24/2022   Medication management 12/21/2022   Chronic Helicobacter pylori gastritis 11/29/2022   Gastric ulcer without hemorrhage or perforation 11/29/2022   Gastroesophageal reflux disease with esophagitis without hemorrhage 11/29/2022   Change in bowel habits 11/29/2022   Diarrhea  11/29/2022   Asthma 10/22/2022   Primary osteoarthritis of both knees 10/03/2022   Type 2 diabetes mellitus with diabetic neuropathy, unspecified (HCC) 04/30/2022   Chronic osteoarthritis 04/30/2022   Rash 01/15/2022   External hemorrhoid 09/15/2021   Ventral hernia 08/16/2021   Allergic conjunctivitis 07/28/2021   Grade II internal hemorrhoids    H. pylori infection 11/08/2020   Gastritis and gastroduodenitis    Lung nodule 10/26/2020   Ventral hernia without obstruction or gangrene 10/04/2020   Chronic diarrhea 09/16/2020   Abnormal Pap smear of cervix 08/18/2020   Hyperlipidemia associated with type 2 diabetes mellitus (HCC) 06/21/2020   Right hip pain 06/21/2020   Orthopnea 01/04/2020   Chronic cough 12/23/2019   Dyspepsia 05/22/2019   Primary hypertension 04/07/2019   Vaginal bleeding 01/27/2019   Low back pain 01/27/2019   BMI 50.0-59.9, adult (HCC) 12/05/2017   Diastolic dysfunction with heart failure (HCC) 10/25/2017   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   Diabetic nephropathy associated with type 2 diabetes mellitus (HCC) 10/25/2017   Type 2 diabetes mellitus with complication (HCC) 09/13/2017   Sleep apnea 08/16/2017   Chronic pain of right knee 07/13/2016   Morbid obesity (HCC) 02/04/2015   Menorrhagia with regular cycle 11/18/2014    PCP: [name], MD  REFERRING PROVIDER: McDiarmid, Krystal BIRCH, MD   REFERRING DIAG: M17.0 (ICD-10-CM) - Primary osteoarthritis of both knees   THERAPY DIAG:  Chronic pain of both shoulders  Chronic pain of left knee  Chronic pain of right knee  Rationale for Evaluation and Treatment: Rehabilitation  ONSET DATE: Chronic  SUBJECTIVE:   SUBJECTIVE STATEMENT: Pt has 6/10 pain in both knees. Has been having issues with going up stairs recently due to left hip.    Pt presents for Re-evaluation after hiatus from previously established PT POC. Pt states that she has noticed increased paraesthesia in LLE-foot yesterday, has improved  today. Has been donning compression. Pt notes inc pain in BLE from previous session. Uses motor cart at the grocery store. Pt reports instability and cavitations in bilateral knees. Pt internally rotates RLE with transfers to improve pain and stability. Pt states that symptoms range in intensity from 4/10-10/10 depending on activity or position. Has been using shower chair, has inc difficulty getting into the shower.   EVAL: Pt presents to PT with reports of chronic bilateral knee pain with recent R>L. Pt is well known to PT, has complex PMH in setting of cardiovascular system. Recent concern for L DVT, awaiting results but they sent her home from ED on 12/16/2023. Also waiting until stable to address H. Pylori at [facility] in a few months. Lateral knee pain bilaterally, pain with transfers and overall mobility.   PERTINENT HISTORY: DM II, Neuropathy, HTN, CHF  PAIN:  6C Are you having pain?  Yes: NPRS scale: 4-5/10 Worst: 10/10 Pain location: bilateral knees Pain description: sharp, burning  Aggravating factors: standing, transfers, walking Relieving factors: rest, medication  PRECAUTIONS: Fall  RED FLAGS:  None   WEIGHT BEARING RESTRICTIONS: No  FALLS:  Has patient fallen in last 6 months? Yes. Number of falls numerous - no falls to floor but has fallen backwards on bed or chair with transfers  LIVING ENVIRONMENT: Lives with: lives with their daughter Lives in: House/apartment Stairs: Yes: External: 3 steps; on right going up Has following equipment at home: None  OCCUPATION: Not working  PLOF: Independent with basic ADLs  PATIENT GOALS: decrease knee pain, be able to move better  OBJECTIVE:  Note: Objective measures were completed at Evaluation unless otherwise noted.  DIAGNOSTIC FINDINGS: see imaging   PATIENT SURVEYS:  LEFS  Extreme difficulty/unable (0), Quite a bit of difficulty (1), Moderate difficulty (2), Little difficulty (3), No difficulty (4) Survey date:   01/06/2024 03/04/24  Any of your usual work, housework or school activities 0 1  2. Usual hobbies, recreational or sporting activities 0 1  3. Getting into/out of the bath 0 1  4. Walking between rooms 1 2  5. Putting on socks/shoes 1 3  6. Squatting  0 0  7. Lifting an object, like a bag of groceries from the floor 3 2  8. Performing light activities around your home 3 2  9. Performing heavy activities around your home 0 0  10. Getting into/out of a car 0 1  11. Walking 2 blocks 0 0  12. Walking 1 mile 0 0  13. Going up/down 10 stairs (1 flight) 1 0  14. Standing for 1 hour 0 0  15.  sitting for 1 hour 2 0  16. Running on even ground 0 0  17. Running on uneven ground 0 0  18. Making sharp turns while running fast 0 0  19. Hopping  0 0  20. Rolling over in bed 2 2  Score total:  13/80 15/80    Annitta: 35    COGNITION: Overall cognitive status: Within functional limits for tasks assessed     SENSATION: WFL  POSTURE: rounded shoulders, forward head, increased lumbar lordosis, and large body habitus  PALPATION: TTP to distal R quad, patellar hypomobility TTP BL anterior shoulder and BL infraspinatus  SHOULDER ROM:  Flexion 70! BL Abduction 90! BL   LOWER EXTREMITY ROM:  Active ROM Right eval Left eval Right 03/04/24 Left 03/04/24  Hip flexion      Hip extension      Hip abduction      Hip adduction      Hip internal rotation      Hip external rotation      Knee flexion 101 90 95 94  Knee extension 13 27 -10 -28  Ankle dorsiflexion      Ankle plantarflexion      Ankle inversion      Ankle eversion       (Blank rows = not tested)   SHOULDER MMT 3+ flexion and abduction BL!   LOWER EXTREMITY MMT:  MMT Right eval Left eval Right 03/04/24 Left 03/04/24  Hip flexion 3+ 3+ 3+/5 4-/5  Hip extension      Hip abduction 3+ 3+ 3+/5 3+/5  Hip adduction      Hip internal rotation      Hip external rotation      Knee flexion 2+ 2+ 3-/5 3/5  Knee  extension 2+ 2+ 3-/5 3-/5  Ankle dorsiflexion      Ankle plantarflexion      Ankle inversion      Ankle eversion       (  Blank rows = not tested)  LOWER EXTREMITY SPECIAL TESTS:  DNT  FUNCTIONAL TESTS:  30 Second Sit to Stand: 6 reps with UE  03/04/24: 9 reps with UE use, decreased upright positioning as reps progress  GAIT: Distance walked: 18ft Assistive device utilized: None Level of assistance: SBA Comments: antalgic gait bilaterally, decreased gait speed, dec stride length   TREATMENT: 03/24/24 Therapeutic Activity: Reeval and update of obj measures and goals  Neuromuscular Reeducation: Seated YTB laq 2x8x3s Seated YTB clamshell 2x8x3s YTB row x6x3s YTB no money x6x3s    03/04/24  re-eval  HEP reviewed and modified to reflect current status, quad set, hamstring stretch, LAQ, weight shifting, inc cues for technique and completion, reports inc pain in R knee following quad set in sitting, begins to resolve with cessation  *Patient required extra time for exercises due to increased monitoring, reassessment, and rest due to low activity tolerance and/or high irritability of symptoms* *Patient required extra time for exercises due to additional reeducation on pain science, optimal loading, prognosis, and relevant tissues/anatomy*  Therapeutic Exercise 02/04/24 Reassessment and update for update Seated LAQ x6 BL (did not tolerate) Supine SAQ 2x8x3s BL Seated heel slides x6 (working within pain free ROM) Seated marches x6x3s Seated calf raises x6x3s  Manual Therapy: Mcconnell tape medial pull to patella right knee  L knee Pfps taping ([email or web address])      [facility] Adult PT Treatment:                                                DATE: 01/21/24 Therapeutic Exercise: LAQ 10 x 3 each  Seated March 10 x 2 each  Seated Calf stretch with towel x 3 each  Manual Therapy: [email or web address]  Right knee  Manual knee  mobs left knee for flexion and extension  Mcconnell tape medial pull to patella right knee   Self Care: Pt given information on KT tape and Leuko tape and how to purchase on [email or web address] Pt given instructions on how to apply KT tape by watching You tube video listed above.    [facility] Adult PT Treatment:                                                DATE: 01/03/2024 Therapeutic Exercise: Seated QS x 5 - 5 hold Seated hamstring stretch x 30 each Seated LAQ x 5 each  PATIENT EDUCATION:  Education details: eval findings, LEFS, HEP, POC Person educated: Patient Education method: Explanation, Demonstration, and Handouts Education comprehension: verbalized understanding and returned demonstration  HOME EXERCISE PROGRAM: Access Code: [email or web address] URL: [email or web address] Date: 03/04/2024 Prepared by: Stann Ohara  Exercises - Seated Quad Set  - 2 x daily - 4 x weekly - 3 sets - 15 reps - 2sec hold - Seated Hamstring Stretch  - 1 x daily - 4 x weekly - 3 reps - 30 sec hold - Seated Long Arc Quad  - 1 x daily - 4 x weekly - 3 sets - 10 reps - 2 sec hold - Side to Side Weight Shift with Counter Support  - 1 x daily - 4 x weekly - 1 sets - 30 reps - 2 sec hold  ASSESSMENT:  CLINICAL IMPRESSION: Updated BL shoulder obj measures  and goals. Patient tolerated treatment with no significant increases in pain with progressions in BL Shoulder and LE loading. Current deficits include: functional activity tolerance, excessive pain, ROM, functional strength. As a result, patient would continue to benefit from skilled PT to address said deficits via plan below.       EVAL: Patient is a 59 y.o. F who was seen today for physical therapy evaluation and treatment for chronic bilateral knee pain. Pt has complex PMH and is well known to therapist, with physical findings consistent with physician impression as pt demonstrates significant decrease in bilateral knee strength and ROM. Functional  mobility deficits observed in setting of 30 Second Sit to Stand and general transfer ability. LEFS score shows severe disability in performance of home ADLs and higher level community activities. Pt would benefit from skilled PT services working on improving LE strength and mobility in order to decrease pain and improve function.   OBJECTIVE IMPAIRMENTS: Abnormal gait, decreased activity tolerance, decreased balance, decreased endurance, decreased mobility, difficulty walking, decreased ROM, decreased strength, postural dysfunction, obesity, and pain  ACTIVITY LIMITATIONS: carrying, lifting, bending, sitting, standing, squatting, sleeping, stairs, transfers, and locomotion level  PARTICIPATION LIMITATIONS: meal prep, cleaning, driving, shopping, community activity, occupation, and yard work  PERSONAL FACTORS: Time since onset of injury/illness/exacerbation and 3+ comorbidities: DM II, Neuropathy, HTN, CHF are also affecting patient's functional outcome.   REHAB POTENTIAL: Fair - complicated by PMH and chronicity of condition  CLINICAL DECISION MAKING: Evolving/moderate complexity  EVALUATION COMPLEXITY: Moderate   GOALS: Goals reviewed with patient? No  SHORT TERM GOALS: Target date: 04/04/2024   Pt will be compliant and knowledgeable with initial HEP for improved comfort and carryover Baseline: initial HEP given  01/21/24: has been I too much pain Goal status: ONGOING 03/04/24: requires inc cues for HEP recall  2.  Pt will self report bilateral knee pain no greater than 8/10 for improved comfort and functional ability Baseline: 10/10 at worst Goal status: IN PROGRESS   LONG TERM GOALS: Target date: 05/05/2024   Pt will improve LEFS to no less than 25/80 as proxy for functional improvement with home ADLs and higher level community activity Baseline: 13/80 03/04/24: 15/80 Goal status: IN PROGRESS  2.  Pt will self report bilateral knee pain no greater than 5/10 for improved comfort  and functional ability Baseline: 10/10 at worst Goal status: IN PROGRESS   3.  Pt will increase 30 Second Sit to Stand rep count to no less than 9 reps for improved balance, strength, and functional mobility Baseline: 6 reps with UE 03/04/24: limited clearance upright positioning with fatigue Goal status: IN PROGRESS   4.  Pt will improve bilateral knee ext to at least 5 degrees to full extension for improved comfort and functional mobility Baseline: see chart Goal status: IN PROGRESS  5.  Pt will improve bilateral LE MMT to no less than 3+/5 for improved comfort and functional mobility Baseline:  see chart Goal status: IN PROGRESS  6. Patient will have at least wfl ROM of BL shoulders without significant increases in pain to demonstrate improved joint mobility needed for ADL completion   Baseline: see chart  Goal status: initial     PLAN:  PT FREQUENCY: 1-2x/week  PT DURATION: 8 weeks  PLANNED INTERVENTIONS: 97164- PT Re-evaluation, 97110-Therapeutic exercises, 97530- Therapeutic activity, [id number]- Neuromuscular re-education, 97535- Self Care, [id number]- Manual therapy, [id number]- Gait training, [phone]- Aquatic Therapy, [phone]- Electrical stimulation (unattended), [id number]- Electrical stimulation (manual), 20560 (1-2 muscles), 20561 (3+ muscles)- Dry  Needling, and Patient/Family education  PLAN FOR NEXT SESSION: assess HEP response, LE strengthening, knee ROM  For all possible CPT codes, reference the Planned Interventions line above.     Check all conditions that are expected to impact treatment: {Conditions expected to impact treatment:Respiratory disorders, Diabetes mellitus, and Musculoskeletal disorders   If treatment provided at initial evaluation, no treatment charged due to lack of authorization.      [place] Odessia Scot  PT, DPT   "

## 2024-03-27 ENCOUNTER — Ambulatory Visit: Payer: Self-pay | Admitting: Family Medicine

## 2024-03-27 ENCOUNTER — Encounter: Payer: Self-pay | Admitting: Family Medicine

## 2024-03-27 ENCOUNTER — Ambulatory Visit

## 2024-03-27 VITALS — BP 123/99 | HR 91 | Ht 64.0 in | Wt 307.6 lb

## 2024-03-27 DIAGNOSIS — M25512 Pain in left shoulder: Secondary | ICD-10-CM | POA: Diagnosis not present

## 2024-03-27 DIAGNOSIS — M170 Bilateral primary osteoarthritis of knee: Secondary | ICD-10-CM

## 2024-03-27 DIAGNOSIS — E118 Type 2 diabetes mellitus with unspecified complications: Secondary | ICD-10-CM | POA: Diagnosis not present

## 2024-03-27 DIAGNOSIS — Z86718 Personal history of other venous thrombosis and embolism: Secondary | ICD-10-CM

## 2024-03-27 DIAGNOSIS — M6281 Muscle weakness (generalized): Secondary | ICD-10-CM

## 2024-03-27 DIAGNOSIS — G8929 Other chronic pain: Secondary | ICD-10-CM

## 2024-03-27 DIAGNOSIS — M25511 Pain in right shoulder: Secondary | ICD-10-CM

## 2024-03-27 DIAGNOSIS — R2689 Other abnormalities of gait and mobility: Secondary | ICD-10-CM

## 2024-03-27 LAB — POCT GLYCOSYLATED HEMOGLOBIN (HGB A1C): HbA1c, POC (controlled diabetic range): 11.7 % — AB (ref 0.0–7.0)

## 2024-03-27 MED ORDER — INSULIN GLARGINE 100 UNIT/ML SOLOSTAR PEN: 48.0000 [IU] | PEN_INJECTOR | SUBCUTANEOUS | 3 refills | Status: AC

## 2024-03-27 MED ORDER — OZEMPIC (2 MG/DOSE) 8 MG/3ML ~~LOC~~ SOPN: 2.0000 mg | PEN_INJECTOR | SUBCUTANEOUS | 5 refills | Status: AC

## 2024-03-27 MED ORDER — PEN NEEDLES 32G X 5 MM MISC: 3 refills | Status: AC

## 2024-03-27 NOTE — Patient Instructions (Signed)
 1) Increase your lantus  insulin  back to 48 units daily  2) Stop taking your Xarelto .  3) I send xray of your shoulders, you can get this done at [facility]. I will let the Sports Medicine doctors see your knee and see if they recommend a different type of imaging. The referral is in.  4) Make an appointment to Dr Koval in 2 weeks and see me in 4 weeks.

## 2024-03-27 NOTE — Progress Notes (Unsigned)
" ° ° °  SUBJECTIVE:   CHIEF COMPLAINT / HPI:   Discussed the use of AI scribe software for clinical note transcription with the patient, who gave verbal consent to proceed.  History of Present Illness Rita Bass is a 59 year old female with osteoarthritis and diabetes who presents with concerns about medication management and joint pain.  Joint pain and instability - Significant pain in knees and shoulders impacting daily activities, including brushing hair and reaching for items in high cabinets - Instability in knees requiring taping and causing difficulty with balance - Frequent falls onto bed or chair due to instability - Right knee with sensation of bone out of place and difficulty straightening right foot - Persistent pain and instability despite attending physical therapy twice weekly - Physical therapist recommended updated imaging for knees and shoulders  Anticoagulation management - Recent ultrasound at [facility] showed no blood clot in leg - Advised to discontinue Xarelto  and has not taken it for a couple of months - Seeks confirmation regarding appropriateness of stopping Xarelto   Gastrointestinal infection - EGD performed on December 30th revealed H. pylori infection - Awaiting further management from infectious disease specialists  Diabetes mellitus management - Blood glucose measured at 300 during hospital visit on December 30th, required insulin  administration - Currently taking metformin , Ozempic , and insulin  (12 units daily; previously on 30 units, advised not to exceed 50 units) - Needs refills for insulin  pen needles, Flexeril , and Ozempic  - Lost 18 pounds since last visit, considers this a positive development  PERTINENT  PMH / PSH: H pylori, GERD, BL knee OA  OBJECTIVE:   BP (!) 123/99   Pulse 91   Ht 5' 4 (1.626 m)   Wt (!) 307 lb 9.6 oz (139.5 kg)   LMP 04/05/2016   SpO2 96%   BMI 52.80 kg/m   General: Alert, pleasant woman.  NAD. HEENT: NCAT. MMM. CV: RRR, no murmurs.   Resp: CTAB, no wheezing or crackles. Normal WOB on RA.    Ext: - Shoulder abduction active ROM limited to <90degrees due to pain.  - TTP along anterior aspect of BL shoulders - TTP of ant BL knees.     ASSESSMENT/PLAN:   Assessment & Plan Type 2 diabetes mellitus with complication (HCC) Check A1c, UACR today. Has been on lower than prescribed insulin  dose due to instruction confusion, will resume prior dose. - Glargine 48u daily (from 12) - Cont ozempic , metformin , jardiance  - f/u in 2 wks with Dr. Amalia Osteoarthritis of both knees, unspecified osteoarthritis type - Cont PT Chronic pain of both shoulders Will obtain BL shoulder XR - New referral to sport medicine for Knee OA and shoulder pain History of deep venous thrombosis (DVT) of distal vein of left lower extremity Now resolved. Stopped xarelto  per Heme Onc. Workup neg.     Rita Nearing, MD [facility] "

## 2024-03-27 NOTE — Therapy (Signed)
 " OUTPATIENT PHYSICAL THERAPY LOWER EXTREMITY TREATMENT   Patient Name: Rita Bass MRN: [id number] DOB:Mar 17, 1966, 59 y.o., female Today's Date: 03/27/2024  END OF SESSION:  PT End of Session - 03/27/24 1106     Visit Number 6    Number of Visits 17    Date for Recertification  05/05/24    Authorization Type [employer]    Authorization Time Period Approved 4 PT visits from 03/24/24-04/22/24    Authorization - Visit Number 2    Authorization - Number of Visits 4    Progress Note Due on Visit --    PT Start Time 1105    PT Stop Time 1143    PT Time Calculation (min) 38 min    Activity Tolerance Patient tolerated treatment well    Behavior During Therapy WFL for tasks assessed/performed            Past Medical History:  Diagnosis Date   Arthritis    Asthma    Chronic pain of left knee    Complication of anesthesia    PONV   Diabetes (HCC)    GERD (gastroesophageal reflux disease)    H/O bronchitis    Hepatic steatosis    Internal hemorrhoids    Kidney stones    Obesity    Renal disorder    Shortness of breath dyspnea    Sleep apnea    past issues - at weight over 400lbs   Sleep apnea in adult    Polysomnogram pending.  Followed by Dr. Shellia   Past Surgical History:  Procedure Laterality Date   BIOPSY  11/01/2020   Procedure: BIOPSY;  Surgeon: San Sandor GAILS, DO;  Location: [facility];  Service: Gastroenterology;;   BIOPSY  05/11/2021   Procedure: BIOPSY;  Surgeon: San Sandor GAILS, DO;  Location: [facility];  Service: Gastroenterology;;   BIOPSY  11/29/2022   Procedure: BIOPSY;  Surgeon: San Sandor GAILS, DO;  Location: [facility];  Service: Gastroenterology;;   BIOPSY OF SKIN SUBCUTANEOUS TISSUE AND/OR MUCOUS MEMBRANE  03/17/2024   Procedure: BIOPSY, SKIN, SUBCUTANEOUS TISSUE, OR MUCOUS MEMBRANE;  Surgeon: San Sandor GAILS, DO;  Location: [facility];  Service: Gastroenterology;;   CESAREAN SECTION     6 c-sections   CHOLECYSTECTOMY      COLONOSCOPY WITH PROPOFOL  N/A 05/11/2021   Procedure: COLONOSCOPY WITH PROPOFOL ;  Surgeon: San Sandor GAILS, DO;  Location: [facility];  Service: Gastroenterology;  Laterality: N/A;   ESOPHAGOGASTRODUODENOSCOPY N/A 03/17/2024   Procedure: EGD (ESOPHAGOGASTRODUODENOSCOPY);  Surgeon: San Sandor GAILS, DO;  Location: [facility];  Service: Gastroenterology;  Laterality: N/A;   ESOPHAGOGASTRODUODENOSCOPY (EGD) WITH PROPOFOL  N/A 11/01/2020   Procedure: ESOPHAGOGASTRODUODENOSCOPY (EGD) WITH PROPOFOL ;  Surgeon: San Sandor GAILS, DO;  Location: [facility];  Service: Gastroenterology;  Laterality: N/A;   ESOPHAGOGASTRODUODENOSCOPY (EGD) WITH PROPOFOL  N/A 11/29/2022   Procedure: ESOPHAGOGASTRODUODENOSCOPY (EGD) WITH PROPOFOL ;  Surgeon: San Sandor GAILS, DO;  Location: [facility];  Service: Gastroenterology;  Laterality: N/A;   HERNIA REPAIR     KIDNEY STONE SURGERY     LEFT HEART CATH AND CORONARY ANGIOGRAPHY N/A 11/05/2023   Procedure: LEFT HEART CATH AND CORONARY ANGIOGRAPHY;  Surgeon: Anner Alm ORN, MD;  Location: [facility] INVASIVE CV [facility];  Service: Cardiovascular;  Laterality: N/A;   OOPHORECTOMY     TRANSTHORACIC ECHOCARDIOGRAM  09/2017   Technically difficult study.  Did not use Definity  contrast.  EF was 55 to 60% with moderate LVH and grade 1 diastolic dysfunction.  No significant valvular lesions noted.  No regional wall motion normality but difficult to assess due to poor imaging.    TUBAL LIGATION     UPPER GASTROINTESTINAL ENDOSCOPY     Patient Active Problem List   Diagnosis Date Noted   Abdominal pain, epigastric 03/17/2024   History of deep venous thrombosis (DVT) of distal vein of left lower extremity 11/25/2023   Palpitations 06/27/2023   Coronary artery disease involving native coronary artery of native heart without angina pectoris 06/27/2023   Sleep disturbance 06/20/2023   Shortness of breath 03/18/2023   Chronic abdominal pain 12/24/2022   Medication management  12/21/2022   Chronic Helicobacter pylori gastritis 11/29/2022   Gastric ulcer without hemorrhage or perforation 11/29/2022   Gastroesophageal reflux disease with esophagitis without hemorrhage 11/29/2022   Change in bowel habits 11/29/2022   Diarrhea 11/29/2022   Asthma 10/22/2022   Primary osteoarthritis of both knees 10/03/2022   Type 2 diabetes mellitus with diabetic neuropathy, unspecified (HCC) 04/30/2022   Chronic osteoarthritis 04/30/2022   Rash 01/15/2022   External hemorrhoid 09/15/2021   Ventral hernia 08/16/2021   Allergic conjunctivitis 07/28/2021   Grade II internal hemorrhoids    H. pylori infection 11/08/2020   Gastritis and gastroduodenitis    Lung nodule 10/26/2020   Ventral hernia without obstruction or gangrene 10/04/2020   Chronic diarrhea 09/16/2020   Abnormal Pap smear of cervix 08/18/2020   Hyperlipidemia associated with type 2 diabetes mellitus (HCC) 06/21/2020   Right hip pain 06/21/2020   Orthopnea 01/04/2020   Chronic cough 12/23/2019   Dyspepsia 05/22/2019   Primary hypertension 04/07/2019   Vaginal bleeding 01/27/2019   Low back pain 01/27/2019   BMI 50.0-59.9, adult (HCC) 12/05/2017   Diastolic dysfunction with heart failure (HCC) 10/25/2017   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   Diabetic nephropathy associated with type 2 diabetes mellitus (HCC) 10/25/2017   Type 2 diabetes mellitus with complication (HCC) 09/13/2017   Sleep apnea 08/16/2017   Chronic pain of right knee 07/13/2016   Morbid obesity (HCC) 02/04/2015   Menorrhagia with regular cycle 11/18/2014    PCP: [name], MD  REFERRING PROVIDER: McDiarmid, Krystal BIRCH, MD   REFERRING DIAG: M17.0 (ICD-10-CM) - Primary osteoarthritis of both knees   THERAPY DIAG:  Chronic pain of both shoulders  Chronic pain of left knee  Chronic pain of right knee  Other abnormalities of gait and mobility  Muscle weakness (generalized)  Rationale for Evaluation and Treatment: Rehabilitation  ONSET  DATE: Chronic  SUBJECTIVE:   SUBJECTIVE STATEMENT: Pt presents today with 5/10 bilateral shoulder, L hip pain, and 3/10 bilateral knee pain. Has MD visit today, has severe pain overall.  EVAL: Pt presents to PT with reports of chronic bilateral knee pain with recent R>L. Pt is well known to PT, has complex PMH in setting of cardiovascular system. Recent concern for L DVT, awaiting results but they sent her home from ED on 12/16/2023. Also waiting until stable to address H. Pylori at [facility] in a few months. Lateral knee pain bilaterally, pain with transfers and overall mobility.   PERTINENT HISTORY: DM II, Neuropathy, HTN, CHF  PAIN:  6C Are you having pain?  Yes: NPRS scale: 4-5/10 Worst: 10/10 Pain location: bilateral knees Pain description: sharp, burning  Aggravating factors: standing, transfers, walking Relieving factors: rest, medication  PRECAUTIONS: Fall  RED FLAGS: None   WEIGHT BEARING RESTRICTIONS: No  FALLS:  Has patient fallen in last 6 months? Yes. Number of falls numerous - no falls to floor but has fallen backwards on bed  or chair with transfers  LIVING ENVIRONMENT: Lives with: lives with their daughter Lives in: House/apartment Stairs: Yes: External: 3 steps; on right going up Has following equipment at home: None  OCCUPATION: Not working  PLOF: Independent with basic ADLs  PATIENT GOALS: decrease knee pain, be able to move better  OBJECTIVE:  Note: Objective measures were completed at Evaluation unless otherwise noted.  DIAGNOSTIC FINDINGS: see imaging   PATIENT SURVEYS:  LEFS  Extreme difficulty/unable (0), Quite a bit of difficulty (1), Moderate difficulty (2), Little difficulty (3), No difficulty (4) Survey date:  01/06/2024 03/04/24  Any of your usual work, housework or school activities 0 1  2. Usual hobbies, recreational or sporting activities 0 1  3. Getting into/out of the bath 0 1  4. Walking between rooms 1 2  5. Putting on  socks/shoes 1 3  6. Squatting  0 0  7. Lifting an object, like a bag of groceries from the floor 3 2  8. Performing light activities around your home 3 2  9. Performing heavy activities around your home 0 0  10. Getting into/out of a car 0 1  11. Walking 2 blocks 0 0  12. Walking 1 mile 0 0  13. Going up/down 10 stairs (1 flight) 1 0  14. Standing for 1 hour 0 0  15.  sitting for 1 hour 2 0  16. Running on even ground 0 0  17. Running on uneven ground 0 0  18. Making sharp turns while running fast 0 0  19. Hopping  0 0  20. Rolling over in bed 2 2  Score total:  13/80 15/80    Annitta: 35    COGNITION: Overall cognitive status: Within functional limits for tasks assessed     SENSATION: WFL  POSTURE: rounded shoulders, forward head, increased lumbar lordosis, and large body habitus  PALPATION: TTP to distal R quad, patellar hypomobility TTP BL anterior shoulder and BL infraspinatus  SHOULDER ROM:  Flexion 70! BL Abduction 90! BL   LOWER EXTREMITY ROM:  Active ROM Right eval Left eval Right 03/04/24 Left 03/04/24  Hip flexion      Hip extension      Hip abduction      Hip adduction      Hip internal rotation      Hip external rotation      Knee flexion 101 90 95 94  Knee extension 13 27 -10 -28  Ankle dorsiflexion      Ankle plantarflexion      Ankle inversion      Ankle eversion       (Blank rows = not tested)   SHOULDER MMT 3+ flexion and abduction BL!   LOWER EXTREMITY MMT:  MMT Right eval Left eval Right 03/04/24 Left 03/04/24  Hip flexion 3+ 3+ 3+/5 4-/5  Hip extension      Hip abduction 3+ 3+ 3+/5 3+/5  Hip adduction      Hip internal rotation      Hip external rotation      Knee flexion 2+ 2+ 3-/5 3/5  Knee extension 2+ 2+ 3-/5 3-/5  Ankle dorsiflexion      Ankle plantarflexion      Ankle inversion      Ankle eversion       (Blank rows = not tested)  LOWER EXTREMITY SPECIAL TESTS:  DNT  FUNCTIONAL TESTS:  30 Second Sit to  Stand: 6 reps with UE  03/04/24: 9 reps with UE use,  decreased upright positioning as reps progress  GAIT: Distance walked: 76ft Assistive device utilized: None Level of assistance: SBA Comments: antalgic gait bilaterally, decreased gait speed, dec stride length   TREATMENT: 03/27/24 Therapeutic Activity: LAQ 2x10 3# Seated hamstring curl 2x10 blue band STS 4x5 - B UE high table Lateral walk YTB at high table x 4 laps Seated clamshell 2x15 blue band Seated ball squeeze 3x10 - 5 hold Seated march 3x20 3#   03/24/24 Therapeutic Activity: Reeval and update of obj measures and goals  Neuromuscular Reeducation: Seated YTB laq 2x8x3s Seated YTB clamshell 2x8x3s YTB row x6x3s YTB no money x6x3s  03/04/24  re-eval  HEP reviewed and modified to reflect current status, quad set, hamstring stretch, LAQ, weight shifting, inc cues for technique and completion, reports inc pain in R knee following quad set in sitting, begins to resolve with cessation  *Patient required extra time for exercises due to increased monitoring, reassessment, and rest due to low activity tolerance and/or high irritability of symptoms* *Patient required extra time for exercises due to additional reeducation on pain science, optimal loading, prognosis, and relevant tissues/anatomy*  Therapeutic Exercise 02/04/24 Reassessment and update for update Seated LAQ x6 BL (did not tolerate) Supine SAQ 2x8x3s BL Seated heel slides x6 (working within pain free ROM) Seated marches x6x3s Seated calf raises x6x3s  Manual Therapy: Mcconnell tape medial pull to patella right knee  L knee Pfps taping ([email or web address])      [facility] Adult PT Treatment:                                                DATE: 01/21/24 Therapeutic Exercise: LAQ 10 x 3 each  Seated March 10 x 2 each  Seated Calf stretch with towel x 3 each  Manual Therapy: [email or web address]  Right knee   Manual knee mobs left knee for flexion and extension  Mcconnell tape medial pull to patella right knee   Self Care: Pt given information on KT tape and Leuko tape and how to purchase on [email or web address] Pt given instructions on how to apply KT tape by watching You tube video listed above.    [facility] Adult PT Treatment:                                                DATE: 01/03/2024 Therapeutic Exercise: Seated QS x 5 - 5 hold Seated hamstring stretch x 30 each Seated LAQ x 5 each  PATIENT EDUCATION:  Education details: eval findings, LEFS, HEP, POC Person educated: Patient Education method: Explanation, Demonstration, and Handouts Education comprehension: verbalized understanding and returned demonstration  HOME EXERCISE PROGRAM: Access Code: [email or web address] URL: [email or web address] Date: 03/04/2024 Prepared by: Stann Ohara  Exercises - Seated Quad Set  - 2 x daily - 4 x weekly - 3 sets - 15 reps - 2sec hold - Seated Hamstring Stretch  - 1 x daily - 4 x weekly - 3 reps - 30 sec hold - Seated Long Arc Quad  - 1 x daily - 4 x weekly - 3 sets - 10 reps - 2 sec hold - Side to Side Weight Shift with Counter Support  - 1 x daily - 4 x weekly - 1 sets - 30 reps - 2  sec hold  ASSESSMENT:  CLINICAL IMPRESSION:  Pt able to tolerate prescribed exercises, continues to be limited by severe pain. Today we worked on LE global strength for decreasing knee and hip pain. She will see MD today for bilateral severe shoulder pain. Pt continues to require skilled PT services, will continue per POC.   EVAL: Patient is a 59 y.o. F who was seen today for physical therapy evaluation and treatment for chronic bilateral knee pain. Pt has complex PMH and is well known to therapist, with physical findings consistent with physician impression as pt demonstrates significant decrease in bilateral knee strength and ROM. Functional mobility deficits observed in setting of 30 Second Sit to Stand and general  transfer ability. LEFS score shows severe disability in performance of home ADLs and higher level community activities. Pt would benefit from skilled PT services working on improving LE strength and mobility in order to decrease pain and improve function.   OBJECTIVE IMPAIRMENTS: Abnormal gait, decreased activity tolerance, decreased balance, decreased endurance, decreased mobility, difficulty walking, decreased ROM, decreased strength, postural dysfunction, obesity, and pain  ACTIVITY LIMITATIONS: carrying, lifting, bending, sitting, standing, squatting, sleeping, stairs, transfers, and locomotion level  PARTICIPATION LIMITATIONS: meal prep, cleaning, driving, shopping, community activity, occupation, and yard work  PERSONAL FACTORS: Time since onset of injury/illness/exacerbation and 3+ comorbidities: DM II, Neuropathy, HTN, CHF are also affecting patient's functional outcome.   REHAB POTENTIAL: Fair - complicated by PMH and chronicity of condition  CLINICAL DECISION MAKING: Evolving/moderate complexity  EVALUATION COMPLEXITY: Moderate   GOALS: Goals reviewed with patient? No  SHORT TERM GOALS: Target date: 04/04/2024   Pt will be compliant and knowledgeable with initial HEP for improved comfort and carryover Baseline: initial HEP given  01/21/24: has been I too much pain Goal status: ONGOING 03/04/24: requires inc cues for HEP recall  2.  Pt will self report bilateral knee pain no greater than 8/10 for improved comfort and functional ability Baseline: 10/10 at worst Goal status: IN PROGRESS   LONG TERM GOALS: Target date: 05/05/2024   Pt will improve LEFS to no less than 25/80 as proxy for functional improvement with home ADLs and higher level community activity Baseline: 13/80 03/04/24: 15/80 Goal status: IN PROGRESS  2.  Pt will self report bilateral knee pain no greater than 5/10 for improved comfort and functional ability Baseline: 10/10 at worst Goal status: IN PROGRESS    3.  Pt will increase 30 Second Sit to Stand rep count to no less than 9 reps for improved balance, strength, and functional mobility Baseline: 6 reps with UE 03/04/24: limited clearance upright positioning with fatigue Goal status: IN PROGRESS   4.  Pt will improve bilateral knee ext to at least 5 degrees to full extension for improved comfort and functional mobility Baseline: see chart Goal status: IN PROGRESS  5.  Pt will improve bilateral LE MMT to no less than 3+/5 for improved comfort and functional mobility Baseline:  see chart Goal status: IN PROGRESS  6. Patient will have at least wfl ROM of BL shoulders without significant increases in pain to demonstrate improved joint mobility needed for ADL completion   Baseline: see chart  Goal status: initial     PLAN:  PT FREQUENCY: 1-2x/week  PT DURATION: 8 weeks  PLANNED INTERVENTIONS: 97164- PT Re-evaluation, 97110-Therapeutic exercises, 97530- Therapeutic activity, [id number]- Neuromuscular re-education, 97535- Self Care, [id number]- Manual therapy, [id number]- Gait training, [id number]- Aquatic Therapy, [phone]- Electrical stimulation (unattended), [id number]- Electrical stimulation (manual), [id number] (1-2 muscles), [id number] (  3+ muscles)- Dry Needling, and Patient/Family education  PLAN FOR NEXT SESSION: assess HEP response, LE strengthening, knee ROM  For all possible CPT codes, reference the Planned Interventions line above.     Check all conditions that are expected to impact treatment: {Conditions expected to impact treatment:Respiratory disorders, Diabetes mellitus, and Musculoskeletal disorders   If treatment provided at initial evaluation, no treatment charged due to lack of authorization.      Alm JAYSON Kingdom PT  03/27/2024 11:46 AM    "

## 2024-03-29 NOTE — Assessment & Plan Note (Signed)
 Check A1c, UACR today. Has been on lower than prescribed insulin  dose due to instruction confusion, will resume prior dose. - Glargine 48u daily (from 12) - Cont ozempic , metformin , jardiance  - f/u in 2 wks with Dr. Koval

## 2024-03-29 NOTE — Assessment & Plan Note (Addendum)
 Now resolved. Stopped xarelto  per Heme Onc. Workup neg.

## 2024-03-30 ENCOUNTER — Ambulatory Visit (HOSPITAL_COMMUNITY)
Admission: RE | Admit: 2024-03-30 | Discharge: 2024-03-30 | Disposition: A | Discharge status: Home / Self Care | Source: Ambulatory Visit | Attending: Family Medicine | Admitting: Family Medicine

## 2024-03-30 DIAGNOSIS — M4810 Ankylosing hyperostosis [Forestier], site unspecified: Secondary | ICD-10-CM | POA: Diagnosis present

## 2024-03-30 DIAGNOSIS — M25511 Pain in right shoulder: Secondary | ICD-10-CM | POA: Diagnosis present

## 2024-03-30 DIAGNOSIS — M25512 Pain in left shoulder: Secondary | ICD-10-CM | POA: Insufficient documentation

## 2024-03-30 DIAGNOSIS — G8929 Other chronic pain: Secondary | ICD-10-CM | POA: Diagnosis present

## 2024-03-31 ENCOUNTER — Ambulatory Visit

## 2024-03-31 ENCOUNTER — Ambulatory Visit: Payer: Self-pay | Admitting: Family Medicine

## 2024-04-02 ENCOUNTER — Telehealth: Payer: Self-pay | Admitting: Family Medicine

## 2024-04-02 DIAGNOSIS — Z86718 Personal history of other venous thrombosis and embolism: Secondary | ICD-10-CM

## 2024-04-02 MED ORDER — RIVAROXABAN 10 MG PO TABS: 10.0000 mg | ORAL_TABLET | Freq: Every day | ORAL | 3 refills | Status: AC

## 2024-04-02 NOTE — Telephone Encounter (Addendum)
" ° ° ° ° °-----   Message from Chinita Patten, MD sent at 03/31/2024  2:41 PM EST ----- Regarding: RE: Xarelto  Continuation Because of her peripheral vascular disease, she was advised to continue taking Xarelto  10 mg daily, prophylactic dosing.  She was advised to stop it around the time of any scheduled surgical procedures, temporarily.  I would recommend that she continue taking at least prophylactic dosing of Xarelto .  Thank you, Avinash. ----- Message ----- From: [name], MD Sent: 03/31/2024   9:01 AM EST To: Chinita Patten, MD Subject: Xarelto  Continuation                           Hi Dr. Patten,  I wanted to reach out about our mutual pt Ms Scobee. She saw you for a DVT and was taking Xarelto ; her work-up for hypercoag was negative and it looks like there was a discussion about stopping vs continuing Xarelto  at your 01/06/24 visit with her. I just saw her and she told me she thought she was supposed to stop her Xarelto  and so she has been off it for the past few months. From your note, it looks like she was supposed to continue it due to PVD risks. I wanted to clarify whether or not you recommend her continue her Xarelto .  Thanks, Hamlet Elicia MD "

## 2024-04-02 NOTE — Therapy (Incomplete)
 " OUTPATIENT PHYSICAL THERAPY LOWER EXTREMITY TREATMENT   Patient Name: Rita Bass MRN: [id number] DOB:Mar 06, 1966, 59 y.o., female Today's Date: 04/02/2024  END OF SESSION:      Past Medical History:  Diagnosis Date   Arthritis    Asthma    Chronic pain of left knee    Complication of anesthesia    PONV   Diabetes (HCC)    GERD (gastroesophageal reflux disease)    H/O bronchitis    Hepatic steatosis    Internal hemorrhoids    Kidney stones    Obesity    Renal disorder    Shortness of breath dyspnea    Sleep apnea    past issues - at weight over 400lbs   Sleep apnea in adult    Polysomnogram pending.  Followed by Dr. Shellia   Past Surgical History:  Procedure Laterality Date   BIOPSY  11/01/2020   Procedure: BIOPSY;  Surgeon: San Sandor GAILS, DO;  Location: [facility];  Service: Gastroenterology;;   BIOPSY  05/11/2021   Procedure: BIOPSY;  Surgeon: San Sandor GAILS, DO;  Location: [facility];  Service: Gastroenterology;;   BIOPSY  11/29/2022   Procedure: BIOPSY;  Surgeon: San Sandor GAILS, DO;  Location: [facility];  Service: Gastroenterology;;   BIOPSY OF SKIN SUBCUTANEOUS TISSUE AND/OR MUCOUS MEMBRANE  03/17/2024   Procedure: BIOPSY, SKIN, SUBCUTANEOUS TISSUE, OR MUCOUS MEMBRANE;  Surgeon: San Sandor GAILS, DO;  Location: [facility];  Service: Gastroenterology;;   CESAREAN SECTION     6 c-sections   CHOLECYSTECTOMY     COLONOSCOPY WITH PROPOFOL  N/A 05/11/2021   Procedure: COLONOSCOPY WITH PROPOFOL ;  Surgeon: San Sandor GAILS, DO;  Location: [facility];  Service: Gastroenterology;  Laterality: N/A;   ESOPHAGOGASTRODUODENOSCOPY N/A 03/17/2024   Procedure: EGD (ESOPHAGOGASTRODUODENOSCOPY);  Surgeon: San Sandor GAILS, DO;  Location: [facility];  Service: Gastroenterology;  Laterality: N/A;   ESOPHAGOGASTRODUODENOSCOPY (EGD) WITH PROPOFOL  N/A 11/01/2020   Procedure: ESOPHAGOGASTRODUODENOSCOPY (EGD) WITH PROPOFOL ;  Surgeon: San Sandor GAILS, DO;  Location: [facility];  Service: Gastroenterology;  Laterality: N/A;   ESOPHAGOGASTRODUODENOSCOPY (EGD) WITH PROPOFOL  N/A 11/29/2022   Procedure: ESOPHAGOGASTRODUODENOSCOPY (EGD) WITH PROPOFOL ;  Surgeon: San Sandor GAILS, DO;  Location: [facility];  Service: Gastroenterology;  Laterality: N/A;   HERNIA REPAIR     KIDNEY STONE SURGERY     LEFT HEART CATH AND CORONARY ANGIOGRAPHY N/A 11/05/2023   Procedure: LEFT HEART CATH AND CORONARY ANGIOGRAPHY;  Surgeon: Anner Alm ORN, MD;  Location: [facility];  Service: Cardiovascular;  Laterality: N/A;   OOPHORECTOMY     TRANSTHORACIC ECHOCARDIOGRAM  09/2017   Technically difficult study.  Did not use Definity  contrast.  EF was 55 to 60% with moderate LVH and grade 1 diastolic dysfunction.  No significant valvular lesions noted.  No regional wall motion normality but difficult to assess due to poor imaging.    TUBAL LIGATION     UPPER GASTROINTESTINAL ENDOSCOPY     Patient Active Problem List   Diagnosis Date Noted   Abdominal pain, epigastric 03/17/2024   History of deep venous thrombosis (DVT) of distal vein of left lower extremity 11/25/2023   Palpitations 06/27/2023   Coronary artery disease involving native coronary artery of native heart without angina pectoris 06/27/2023   Sleep disturbance 06/20/2023   Shortness of breath 03/18/2023   Chronic abdominal pain 12/24/2022   Medication management 12/21/2022   Chronic Helicobacter pylori gastritis 11/29/2022   Gastric ulcer without hemorrhage or perforation 11/29/2022   Gastroesophageal reflux disease  with esophagitis without hemorrhage 11/29/2022   Change in bowel habits 11/29/2022   Diarrhea 11/29/2022   Asthma 10/22/2022   Primary osteoarthritis of both knees 10/03/2022   Type 2 diabetes mellitus with diabetic neuropathy, unspecified (HCC) 04/30/2022   Chronic osteoarthritis 04/30/2022   Rash 01/15/2022   External hemorrhoid 09/15/2021   Ventral hernia 08/16/2021    Allergic conjunctivitis 07/28/2021   Grade II internal hemorrhoids    H. pylori infection 11/08/2020   Gastritis and gastroduodenitis    Lung nodule 10/26/2020   Ventral hernia without obstruction or gangrene 10/04/2020   Chronic diarrhea 09/16/2020   Abnormal Pap smear of cervix 08/18/2020   Hyperlipidemia associated with type 2 diabetes mellitus (HCC) 06/21/2020   Right hip pain 06/21/2020   Orthopnea 01/04/2020   Chronic cough 12/23/2019   Dyspepsia 05/22/2019   Primary hypertension 04/07/2019   Vaginal bleeding 01/27/2019   Low back pain 01/27/2019   BMI 50.0-59.9, adult (HCC) 12/05/2017   Diastolic dysfunction with heart failure (HCC) 10/25/2017   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   Diabetic nephropathy associated with type 2 diabetes mellitus (HCC) 10/25/2017   Type 2 diabetes mellitus with complication (HCC) 09/13/2017   Sleep apnea 08/16/2017   Chronic pain of right knee 07/13/2016   Morbid obesity (HCC) 02/04/2015   Menorrhagia with regular cycle 11/18/2014    PCP: [name], MD  REFERRING PROVIDER: McDiarmid, Krystal BIRCH, MD   REFERRING DIAG: M17.0 (ICD-10-CM) - Primary osteoarthritis of both knees   THERAPY DIAG:  No diagnosis found.  Rationale for Evaluation and Treatment: Rehabilitation  ONSET DATE: Chronic  SUBJECTIVE:   SUBJECTIVE STATEMENT: Pt presents today with 5/10 bilateral shoulder, L hip pain, and 3/10 bilateral knee pain. Has MD visit today, has severe pain overall.  EVAL: Pt presents to PT with reports of chronic bilateral knee pain with recent R>L. Pt is well known to PT, has complex PMH in setting of cardiovascular system. Recent concern for L DVT, awaiting results but they sent her home from ED on 12/16/2023. Also waiting until stable to address H. Pylori at [facility] in a few months. Lateral knee pain bilaterally, pain with transfers and overall mobility.   PERTINENT HISTORY: DM II, Neuropathy, HTN, CHF  PAIN:  6C Are you having pain?  Yes:  NPRS scale: 4-5/10 Worst: 10/10 Pain location: bilateral knees Pain description: sharp, burning  Aggravating factors: standing, transfers, walking Relieving factors: rest, medication  PRECAUTIONS: Fall  RED FLAGS: None   WEIGHT BEARING RESTRICTIONS: No  FALLS:  Has patient fallen in last 6 months? Yes. Number of falls numerous - no falls to floor but has fallen backwards on bed or chair with transfers  LIVING ENVIRONMENT: Lives with: lives with their daughter Lives in: House/apartment Stairs: Yes: External: 3 steps; on right going up Has following equipment at home: None  OCCUPATION: Not working  PLOF: Independent with basic ADLs  PATIENT GOALS: decrease knee pain, be able to move better  OBJECTIVE:  Note: Objective measures were completed at Evaluation unless otherwise noted.  DIAGNOSTIC FINDINGS: see imaging   PATIENT SURVEYS:  LEFS  Extreme difficulty/unable (0), Quite a bit of difficulty (1), Moderate difficulty (2), Little difficulty (3), No difficulty (4) Survey date:  01/06/2024 03/04/24  Any of your usual work, housework or school activities 0 1  2. Usual hobbies, recreational or sporting activities 0 1  3. Getting into/out of the bath 0 1  4. Walking between rooms 1 2  5. Putting on socks/shoes 1 3  6. Squatting  0 0  7. Lifting an object, like a bag of groceries from the floor 3 2  8. Performing light activities around your home 3 2  9. Performing heavy activities around your home 0 0  10. Getting into/out of a car 0 1  11. Walking 2 blocks 0 0  12. Walking 1 mile 0 0  13. Going up/down 10 stairs (1 flight) 1 0  14. Standing for 1 hour 0 0  15.  sitting for 1 hour 2 0  16. Running on even ground 0 0  17. Running on uneven ground 0 0  18. Making sharp turns while running fast 0 0  19. Hopping  0 0  20. Rolling over in bed 2 2  Score total:  13/80 15/80    Annitta: 35    COGNITION: Overall cognitive status: Within functional limits for tasks  assessed     SENSATION: WFL  POSTURE: rounded shoulders, forward head, increased lumbar lordosis, and large body habitus  PALPATION: TTP to distal R quad, patellar hypomobility TTP BL anterior shoulder and BL infraspinatus  SHOULDER ROM:  Flexion 70! BL Abduction 90! BL   LOWER EXTREMITY ROM:  Active ROM Right eval Left eval Right 03/04/24 Left 03/04/24  Hip flexion      Hip extension      Hip abduction      Hip adduction      Hip internal rotation      Hip external rotation      Knee flexion 101 90 95 94  Knee extension 13 27 -10 -28  Ankle dorsiflexion      Ankle plantarflexion      Ankle inversion      Ankle eversion       (Blank rows = not tested)   SHOULDER MMT 3+ flexion and abduction BL!   LOWER EXTREMITY MMT:  MMT Right eval Left eval Right 03/04/24 Left 03/04/24  Hip flexion 3+ 3+ 3+/5 4-/5  Hip extension      Hip abduction 3+ 3+ 3+/5 3+/5  Hip adduction      Hip internal rotation      Hip external rotation      Knee flexion 2+ 2+ 3-/5 3/5  Knee extension 2+ 2+ 3-/5 3-/5  Ankle dorsiflexion      Ankle plantarflexion      Ankle inversion      Ankle eversion       (Blank rows = not tested)  LOWER EXTREMITY SPECIAL TESTS:  DNT  FUNCTIONAL TESTS:  30 Second Sit to Stand: 6 reps with UE  03/04/24: 9 reps with UE use, decreased upright positioning as reps progress  GAIT: Distance walked: 80ft Assistive device utilized: None Level of assistance: SBA Comments: antalgic gait bilaterally, decreased gait speed, dec stride length   TREATMENT: 03/27/24 Therapeutic Activity: LAQ 2x10 3# Seated hamstring curl 2x10 blue band STS 4x5 - B UE high table Lateral walk YTB at high table x 4 laps Seated clamshell 2x15 blue band Seated ball squeeze 3x10 - 5 hold Seated march 3x20 3#   03/24/24 Therapeutic Activity: Reeval and update of obj measures and goals  Neuromuscular Reeducation: Seated YTB laq 2x8x3s Seated YTB clamshell 2x8x3s YTB  row x6x3s YTB no money x6x3s  03/04/24  re-eval  HEP reviewed and modified to reflect current status, quad set, hamstring stretch, LAQ, weight shifting, inc cues for technique and completion, reports inc pain in R knee following quad set in sitting, begins to resolve with cessation  *  Patient required extra time for exercises due to increased monitoring, reassessment, and rest due to low activity tolerance and/or high irritability of symptoms* *Patient required extra time for exercises due to additional reeducation on pain science, optimal loading, prognosis, and relevant tissues/anatomy*  Therapeutic Exercise 02/04/24 Reassessment and update for update Seated LAQ x6 BL (did not tolerate) Supine SAQ 2x8x3s BL Seated heel slides x6 (working within pain free ROM) Seated marches x6x3s Seated calf raises x6x3s  Manual Therapy: Mcconnell tape medial pull to patella right knee  L knee Pfps taping ([email or web address])      [facility] Adult PT Treatment:                                                DATE: 01/21/24 Therapeutic Exercise: LAQ 10 x 3 each  Seated March 10 x 2 each  Seated Calf stretch with towel x 3 each  Manual Therapy: [email or web address]  Right knee  Manual knee mobs left knee for flexion and extension  Mcconnell tape medial pull to patella right knee   Self Care: Pt given information on KT tape and Leuko tape and how to purchase on [email or web address] Pt given instructions on how to apply KT tape by watching You tube video listed above.    [facility] Adult PT Treatment:                                                DATE: 01/03/2024 Therapeutic Exercise: Seated QS x 5 - 5 hold Seated hamstring stretch x 30 each Seated LAQ x 5 each  PATIENT EDUCATION:  Education details: eval findings, LEFS, HEP, POC Person educated: Patient Education method: Explanation, Demonstration, and Handouts Education comprehension: verbalized  understanding and returned demonstration  HOME EXERCISE PROGRAM: Access Code: [email or web address] URL: [email or web address] Date: 03/04/2024 Prepared by: Stann Ohara  Exercises - Seated Quad Set  - 2 x daily - 4 x weekly - 3 sets - 15 reps - 2sec hold - Seated Hamstring Stretch  - 1 x daily - 4 x weekly - 3 reps - 30 sec hold - Seated Long Arc Quad  - 1 x daily - 4 x weekly - 3 sets - 10 reps - 2 sec hold - Side to Side Weight Shift with Counter Support  - 1 x daily - 4 x weekly - 1 sets - 30 reps - 2 sec hold  ASSESSMENT:  CLINICAL IMPRESSION:  Pt able to tolerate prescribed exercises, continues to be limited by severe pain. Today we worked on LE global strength for decreasing knee and hip pain. She will see MD today for bilateral severe shoulder pain. Pt continues to require skilled PT services, will continue per POC.   EVAL: Patient is a 59 y.o. F who was seen today for physical therapy evaluation and treatment for chronic bilateral knee pain. Pt has complex PMH and is well known to therapist, with physical findings consistent with physician impression as pt demonstrates significant decrease in bilateral knee strength and ROM. Functional mobility deficits observed in setting of 30 Second Sit to Stand and general transfer ability. LEFS score shows severe disability in performance of home ADLs and higher level community activities. Pt would benefit from skilled PT services working on improving LE strength  and mobility in order to decrease pain and improve function.   OBJECTIVE IMPAIRMENTS: Abnormal gait, decreased activity tolerance, decreased balance, decreased endurance, decreased mobility, difficulty walking, decreased ROM, decreased strength, postural dysfunction, obesity, and pain  ACTIVITY LIMITATIONS: carrying, lifting, bending, sitting, standing, squatting, sleeping, stairs, transfers, and locomotion level  PARTICIPATION LIMITATIONS: meal prep, cleaning, driving, shopping,  community activity, occupation, and yard work  PERSONAL FACTORS: Time since onset of injury/illness/exacerbation and 3+ comorbidities: DM II, Neuropathy, HTN, CHF are also affecting patient's functional outcome.   REHAB POTENTIAL: Fair - complicated by PMH and chronicity of condition  CLINICAL DECISION MAKING: Evolving/moderate complexity  EVALUATION COMPLEXITY: Moderate   GOALS: Goals reviewed with patient? No  SHORT TERM GOALS: Target date: 04/04/2024   Pt will be compliant and knowledgeable with initial HEP for improved comfort and carryover Baseline: initial HEP given  01/21/24: has been I too much pain Goal status: ONGOING 03/04/24: requires inc cues for HEP recall  2.  Pt will self report bilateral knee pain no greater than 8/10 for improved comfort and functional ability Baseline: 10/10 at worst Goal status: IN PROGRESS   LONG TERM GOALS: Target date: 05/05/2024   Pt will improve LEFS to no less than 25/80 as proxy for functional improvement with home ADLs and higher level community activity Baseline: 13/80 03/04/24: 15/80 Goal status: IN PROGRESS  2.  Pt will self report bilateral knee pain no greater than 5/10 for improved comfort and functional ability Baseline: 10/10 at worst Goal status: IN PROGRESS   3.  Pt will increase 30 Second Sit to Stand rep count to no less than 9 reps for improved balance, strength, and functional mobility Baseline: 6 reps with UE 03/04/24: limited clearance upright positioning with fatigue Goal status: IN PROGRESS   4.  Pt will improve bilateral knee ext to at least 5 degrees to full extension for improved comfort and functional mobility Baseline: see chart Goal status: IN PROGRESS  5.  Pt will improve bilateral LE MMT to no less than 3+/5 for improved comfort and functional mobility Baseline:  see chart Goal status: IN PROGRESS  6. Patient will have at least wfl ROM of BL shoulders without significant increases in pain to  demonstrate improved joint mobility needed for ADL completion   Baseline: see chart  Goal status: initial     PLAN:  PT FREQUENCY: 1-2x/week  PT DURATION: 8 weeks  PLANNED INTERVENTIONS: 97164- PT Re-evaluation, 97110-Therapeutic exercises, 97530- Therapeutic activity, [id number]- Neuromuscular re-education, 97535- Self Care, [id number]- Manual therapy, [id number]- Gait training, [phone]- Aquatic Therapy, [phone]- Electrical stimulation (unattended), [id number]- Electrical stimulation (manual), 20560 (1-2 muscles), 20561 (3+ muscles)- Dry Needling, and Patient/Family education  PLAN FOR NEXT SESSION: assess HEP response, LE strengthening, knee ROM  For all possible CPT codes, reference the Planned Interventions line above.     Check all conditions that are expected to impact treatment: {Conditions expected to impact treatment:Respiratory disorders, Diabetes mellitus, and Musculoskeletal disorders   If treatment provided at initial evaluation, no treatment charged due to lack of authorization.      Alm JAYSON Kingdom PT  04/02/24 1:17 PM    "

## 2024-04-02 NOTE — Telephone Encounter (Signed)
 Clarified dosing of Xarelto  with Hematologist Dr. Autumn for pts DVT. He recommends continuing daily 10mg  Xarelto  for prophylaxis given her PVD. Advised to hold temporarily for procedures.   Called pt to update her on this and sent refill of xarelto . Pt agreeable with this plan.

## 2024-04-03 ENCOUNTER — Encounter: Payer: Self-pay | Admitting: Physical Therapy

## 2024-04-03 ENCOUNTER — Ambulatory Visit

## 2024-04-03 ENCOUNTER — Ambulatory Visit: Admitting: Physical Therapy

## 2024-04-03 ENCOUNTER — Other Ambulatory Visit: Payer: Self-pay

## 2024-04-03 DIAGNOSIS — R1033 Periumbilical pain: Secondary | ICD-10-CM

## 2024-04-03 DIAGNOSIS — M25511 Pain in right shoulder: Secondary | ICD-10-CM | POA: Diagnosis not present

## 2024-04-03 DIAGNOSIS — M170 Bilateral primary osteoarthritis of knee: Secondary | ICD-10-CM

## 2024-04-03 DIAGNOSIS — G8929 Other chronic pain: Secondary | ICD-10-CM

## 2024-04-03 MED ORDER — TRAMADOL HCL 50 MG PO TABS: 50.0000 mg | ORAL_TABLET | Freq: Two times a day (BID) | ORAL | 0 refills | Status: AC | PRN

## 2024-04-03 NOTE — Therapy (Signed)
 " OUTPATIENT PHYSICAL THERAPY LOWER EXTREMITY TREATMENT   Patient Name: Rita Bass MRN: [id number] DOB:10-25-1965, 59 y.o., female Today's Date: 04/03/2024  END OF SESSION:  PT End of Session - 04/03/24 1147     Visit Number 7    Number of Visits 17    Date for Recertification  05/05/24    Authorization Type [employer]    Authorization Time Period Approved 4 PT visits from 03/24/24-04/22/24    Authorization - Visit Number 3    Authorization - Number of Visits 4    PT Start Time 1145    PT Stop Time 1224    PT Time Calculation (min) 39 min            Past Medical History:  Diagnosis Date   Arthritis    Asthma    Chronic pain of left knee    Complication of anesthesia    PONV   Diabetes (HCC)    GERD (gastroesophageal reflux disease)    H/O bronchitis    Hepatic steatosis    Internal hemorrhoids    Kidney stones    Obesity    Renal disorder    Shortness of breath dyspnea    Sleep apnea    past issues - at weight over 400lbs   Sleep apnea in adult    Polysomnogram pending.  Followed by Dr. Shellia   Past Surgical History:  Procedure Laterality Date   BIOPSY  11/01/2020   Procedure: BIOPSY;  Surgeon: San Sandor GAILS, DO;  Location: [facility];  Service: Gastroenterology;;   BIOPSY  05/11/2021   Procedure: BIOPSY;  Surgeon: San Sandor GAILS, DO;  Location: [facility];  Service: Gastroenterology;;   BIOPSY  11/29/2022   Procedure: BIOPSY;  Surgeon: San Sandor GAILS, DO;  Location: [facility];  Service: Gastroenterology;;   BIOPSY OF SKIN SUBCUTANEOUS TISSUE AND/OR MUCOUS MEMBRANE  03/17/2024   Procedure: BIOPSY, SKIN, SUBCUTANEOUS TISSUE, OR MUCOUS MEMBRANE;  Surgeon: San Sandor GAILS, DO;  Location: [facility];  Service: Gastroenterology;;   CESAREAN SECTION     6 c-sections   CHOLECYSTECTOMY     COLONOSCOPY WITH PROPOFOL  N/A 05/11/2021   Procedure: COLONOSCOPY WITH PROPOFOL ;  Surgeon: San Sandor GAILS, DO;  Location: [facility];   Service: Gastroenterology;  Laterality: N/A;   ESOPHAGOGASTRODUODENOSCOPY N/A 03/17/2024   Procedure: EGD (ESOPHAGOGASTRODUODENOSCOPY);  Surgeon: San Sandor GAILS, DO;  Location: [facility];  Service: Gastroenterology;  Laterality: N/A;   ESOPHAGOGASTRODUODENOSCOPY (EGD) WITH PROPOFOL  N/A 11/01/2020   Procedure: ESOPHAGOGASTRODUODENOSCOPY (EGD) WITH PROPOFOL ;  Surgeon: San Sandor GAILS, DO;  Location: [facility];  Service: Gastroenterology;  Laterality: N/A;   ESOPHAGOGASTRODUODENOSCOPY (EGD) WITH PROPOFOL  N/A 11/29/2022   Procedure: ESOPHAGOGASTRODUODENOSCOPY (EGD) WITH PROPOFOL ;  Surgeon: San Sandor GAILS, DO;  Location: [facility];  Service: Gastroenterology;  Laterality: N/A;   HERNIA REPAIR     KIDNEY STONE SURGERY     LEFT HEART CATH AND CORONARY ANGIOGRAPHY N/A 11/05/2023   Procedure: LEFT HEART CATH AND CORONARY ANGIOGRAPHY;  Surgeon: Anner Alm ORN, MD;  Location: [facility];  Service: Cardiovascular;  Laterality: N/A;   OOPHORECTOMY     TRANSTHORACIC ECHOCARDIOGRAM  09/2017   Technically difficult study.  Did not use Definity  contrast.  EF was 55 to 60% with moderate LVH and grade 1 diastolic dysfunction.  No significant valvular lesions noted.  No regional wall motion normality but difficult to assess due to poor imaging.    TUBAL LIGATION     UPPER GASTROINTESTINAL ENDOSCOPY  Patient Active Problem List   Diagnosis Date Noted   Abdominal pain, epigastric 03/17/2024   History of deep venous thrombosis (DVT) of distal vein of left lower extremity 11/25/2023   Palpitations 06/27/2023   Coronary artery disease involving native coronary artery of native heart without angina pectoris 06/27/2023   Sleep disturbance 06/20/2023   Shortness of breath 03/18/2023   Chronic abdominal pain 12/24/2022   Medication management 12/21/2022   Chronic Helicobacter pylori gastritis 11/29/2022   Gastric ulcer without hemorrhage or perforation 11/29/2022   Gastroesophageal  reflux disease with esophagitis without hemorrhage 11/29/2022   Change in bowel habits 11/29/2022   Diarrhea 11/29/2022   Asthma 10/22/2022   Primary osteoarthritis of both knees 10/03/2022   Type 2 diabetes mellitus with diabetic neuropathy, unspecified (HCC) 04/30/2022   Chronic osteoarthritis 04/30/2022   Rash 01/15/2022   External hemorrhoid 09/15/2021   Ventral hernia 08/16/2021   Allergic conjunctivitis 07/28/2021   Grade II internal hemorrhoids    H. pylori infection 11/08/2020   Gastritis and gastroduodenitis    Lung nodule 10/26/2020   Ventral hernia without obstruction or gangrene 10/04/2020   Chronic diarrhea 09/16/2020   Abnormal Pap smear of cervix 08/18/2020   Hyperlipidemia associated with type 2 diabetes mellitus (HCC) 06/21/2020   Right hip pain 06/21/2020   Orthopnea 01/04/2020   Chronic cough 12/23/2019   Dyspepsia 05/22/2019   Primary hypertension 04/07/2019   Vaginal bleeding 01/27/2019   Low back pain 01/27/2019   BMI 50.0-59.9, adult (HCC) 12/05/2017   Diastolic dysfunction with heart failure (HCC) 10/25/2017   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   Diabetic nephropathy associated with type 2 diabetes mellitus (HCC) 10/25/2017   Type 2 diabetes mellitus with complication (HCC) 09/13/2017   Sleep apnea 08/16/2017   Chronic pain of right knee 07/13/2016   Morbid obesity (HCC) 02/04/2015   Menorrhagia with regular cycle 11/18/2014    PCP: [name], MD  REFERRING PROVIDER: McDiarmid, Krystal BIRCH, MD   REFERRING DIAG: M17.0 (ICD-10-CM) - Primary osteoarthritis of both knees   THERAPY DIAG:  Chronic pain of both shoulders  Chronic pain of left knee  Rationale for Evaluation and Treatment: Rehabilitation  ONSET DATE: Chronic  SUBJECTIVE:   SUBJECTIVE STATEMENT: Pt presents today with 5/10 bilateral shoulder, L hip pain, and 4/10 bilateral knee pain. Don't have shoulder xray results yet.   EVAL: Pt presents to PT with reports of chronic bilateral knee pain  with recent R>L. Pt is well known to PT, has complex PMH in setting of cardiovascular system. Recent concern for L DVT, awaiting results but they sent her home from ED on 12/16/2023. Also waiting until stable to address H. Pylori at [facility] in a few months. Lateral knee pain bilaterally, pain with transfers and overall mobility.   PERTINENT HISTORY: DM II, Neuropathy, HTN, CHF  PAIN:  6C Are you having pain?  Yes: NPRS scale: 4-5/10 Worst: 10/10 Pain location: bilateral knees Pain description: sharp, burning  Aggravating factors: standing, transfers, walking Relieving factors: rest, medication  PRECAUTIONS: Fall  RED FLAGS: None   WEIGHT BEARING RESTRICTIONS: No  FALLS:  Has patient fallen in last 6 months? Yes. Number of falls numerous - no falls to floor but has fallen backwards on bed or chair with transfers  LIVING ENVIRONMENT: Lives with: lives with their daughter Lives in: House/apartment Stairs: Yes: External: 3 steps; on right going up Has following equipment at home: None  OCCUPATION: Not working  PLOF: Independent with basic ADLs  PATIENT GOALS: decrease knee pain,  be able to move better  OBJECTIVE:  Note: Objective measures were completed at Evaluation unless otherwise noted.  DIAGNOSTIC FINDINGS: see imaging   PATIENT SURVEYS:  LEFS  Extreme difficulty/unable (0), Quite a bit of difficulty (1), Moderate difficulty (2), Little difficulty (3), No difficulty (4) Survey date:  01/06/2024 03/04/24  Any of your usual work, housework or school activities 0 1  2. Usual hobbies, recreational or sporting activities 0 1  3. Getting into/out of the bath 0 1  4. Walking between rooms 1 2  5. Putting on socks/shoes 1 3  6. Squatting  0 0  7. Lifting an object, like a bag of groceries from the floor 3 2  8. Performing light activities around your home 3 2  9. Performing heavy activities around your home 0 0  10. Getting into/out of a car 0 1  11. Walking 2  blocks 0 0  12. Walking 1 mile 0 0  13. Going up/down 10 stairs (1 flight) 1 0  14. Standing for 1 hour 0 0  15.  sitting for 1 hour 2 0  16. Running on even ground 0 0  17. Running on uneven ground 0 0  18. Making sharp turns while running fast 0 0  19. Hopping  0 0  20. Rolling over in bed 2 2  Score total:  13/80 15/80    Annitta: 35    COGNITION: Overall cognitive status: Within functional limits for tasks assessed     SENSATION: WFL  POSTURE: rounded shoulders, forward head, increased lumbar lordosis, and large body habitus  PALPATION: TTP to distal R quad, patellar hypomobility TTP BL anterior shoulder and BL infraspinatus  SHOULDER ROM:  Flexion 70! BL Abduction 90! BL   LOWER EXTREMITY ROM:  Active ROM Right eval Left eval Right 03/04/24 Left 03/04/24  Hip flexion      Hip extension      Hip abduction      Hip adduction      Hip internal rotation      Hip external rotation      Knee flexion 101 90 95 94  Knee extension 13 27 -10 -28  Ankle dorsiflexion      Ankle plantarflexion      Ankle inversion      Ankle eversion       (Blank rows = not tested)   SHOULDER MMT 3+ flexion and abduction BL!   LOWER EXTREMITY MMT:  MMT Right eval Left eval Right 03/04/24 Left 03/04/24  Hip flexion 3+ 3+ 3+/5 4-/5  Hip extension      Hip abduction 3+ 3+ 3+/5 3+/5  Hip adduction      Hip internal rotation      Hip external rotation      Knee flexion 2+ 2+ 3-/5 3/5  Knee extension 2+ 2+ 3-/5 3-/5  Ankle dorsiflexion      Ankle plantarflexion      Ankle inversion      Ankle eversion       (Blank rows = not tested)  LOWER EXTREMITY SPECIAL TESTS:  DNT  FUNCTIONAL TESTS:  30 Second Sit to Stand: 6 reps with UE  03/04/24: 9 reps with UE use, decreased upright positioning as reps progress  GAIT: Distance walked: 8ft Assistive device utilized: None Level of assistance: SBA Comments: antalgic gait bilaterally, decreased gait speed, dec  stride length   TREATMENT: 04/03/24 Seated March 4# 10 x 3  Seated LAQ 4# 3 x 10  Seated CLAM BTB 10 x 3  Ball squeeze - increased pain so disc Seated h/s curl Blue 15 x 2 each  STS 4 x 3 30 sec STS 10 reps with UE mat table standard height    03/27/24 Therapeutic Activity: LAQ 2x10 3# Seated hamstring curl 2x10 blue band STS 4x5 - B UE high table Lateral walk YTB at high table x 4 laps Seated clamshell 2x15 blue band Seated ball squeeze 3x10 - 5 hold Seated march 3x20 3#   03/24/24 Therapeutic Activity: Reeval and update of obj measures and goals  Neuromuscular Reeducation: Seated YTB laq 2x8x3s Seated YTB clamshell 2x8x3s YTB row x6x3s YTB no money x6x3s  03/04/24  re-eval  HEP reviewed and modified to reflect current status, quad set, hamstring stretch, LAQ, weight shifting, inc cues for technique and completion, reports inc pain in R knee following quad set in sitting, begins to resolve with cessation  *Patient required extra time for exercises due to increased monitoring, reassessment, and rest due to low activity tolerance and/or high irritability of symptoms* *Patient required extra time for exercises due to additional reeducation on pain science, optimal loading, prognosis, and relevant tissues/anatomy*  Therapeutic Exercise 02/04/24 Reassessment and update for update Seated LAQ x6 BL (did not tolerate) Supine SAQ 2x8x3s BL Seated heel slides x6 (working within pain free ROM) Seated marches x6x3s Seated calf raises x6x3s  Manual Therapy: Mcconnell tape medial pull to patella right knee  L knee Pfps taping ([email or web address])      [facility] Adult PT Treatment:                                                DATE: 01/21/24 Therapeutic Exercise: LAQ 10 x 3 each  Seated March 10 x 2 each  Seated Calf stretch with towel x 3 each  Manual Therapy: [email or web address]  Right knee  Manual knee mobs left knee for  flexion and extension  Mcconnell tape medial pull to patella right knee   Self Care: Pt given information on KT tape and Leuko tape and how to purchase on [email or web address] Pt given instructions on how to apply KT tape by watching You tube video listed above.    [facility] Adult PT Treatment:                                                DATE: 01/03/2024 Therapeutic Exercise: Seated QS x 5 - 5 hold Seated hamstring stretch x 30 each Seated LAQ x 5 each  PATIENT EDUCATION:  Education details: eval findings, LEFS, HEP, POC Person educated: Patient Education method: Explanation, Demonstration, and Handouts Education comprehension: verbalized understanding and returned demonstration  HOME EXERCISE PROGRAM: Access Code: [email or web address] URL: [email or web address] Date: 03/04/2024 Prepared by: Stann Ohara  Exercises - Seated Quad Set  - 2 x daily - 4 x weekly - 3 sets - 15 reps - 2sec hold - Seated Hamstring Stretch  - 1 x daily - 4 x weekly - 3 reps - 30 sec hold - Seated Long Arc Quad  - 1 x daily - 4 x weekly - 3 sets - 10 reps - 2 sec hold - Side to Side Weight Shift with Counter Support  - 1 x daily - 4 x  weekly - 1 sets - 30 reps - 2 sec hold  ASSESSMENT:  CLINICAL IMPRESSION:  Pt able to tolerate prescribed exercises, continues to be limited by severe pain at night in bilateral knees. Needs assist with hair grooming due to severe shoulder pain.  Today we worked on LE global strength for decreasing knee and hip pain. She is awaiting x ray report for shoulders. She completed 10 STS in 30 sec today, meeting LTG# 3.  Pt continues to require skilled PT services, will continue per POC. Has one more covered visit in her insurance auth.   EVAL: Patient is a 59 y.o. F who was seen today for physical therapy evaluation and treatment for chronic bilateral knee pain. Pt has complex PMH and is well known to therapist, with physical findings consistent with physician impression as pt  demonstrates significant decrease in bilateral knee strength and ROM. Functional mobility deficits observed in setting of 30 Second Sit to Stand and general transfer ability. LEFS score shows severe disability in performance of home ADLs and higher level community activities. Pt would benefit from skilled PT services working on improving LE strength and mobility in order to decrease pain and improve function.   OBJECTIVE IMPAIRMENTS: Abnormal gait, decreased activity tolerance, decreased balance, decreased endurance, decreased mobility, difficulty walking, decreased ROM, decreased strength, postural dysfunction, obesity, and pain  ACTIVITY LIMITATIONS: carrying, lifting, bending, sitting, standing, squatting, sleeping, stairs, transfers, and locomotion level  PARTICIPATION LIMITATIONS: meal prep, cleaning, driving, shopping, community activity, occupation, and yard work  PERSONAL FACTORS: Time since onset of injury/illness/exacerbation and 3+ comorbidities: DM II, Neuropathy, HTN, CHF are also affecting patient's functional outcome.   REHAB POTENTIAL: Fair - complicated by PMH and chronicity of condition  CLINICAL DECISION MAKING: Evolving/moderate complexity  EVALUATION COMPLEXITY: Moderate   GOALS: Goals reviewed with patient? No  SHORT TERM GOALS: Target date: 04/04/2024   Pt will be compliant and knowledgeable with initial HEP for improved comfort and carryover Baseline: initial HEP given  01/21/24: has been I too much pain Goal status: ONGOING 03/04/24: requires inc cues for HEP recall 04/03/24: reports compliance   2.  Pt will self report bilateral knee pain no greater than 8/10 for improved comfort and functional ability Baseline: 10/10 at worst 04/03/24: sees some more better days, can still be 10/10 at night Goal status: IN PROGRESS   LONG TERM GOALS: Target date: 05/05/2024   Pt will improve LEFS to no less than 25/80 as proxy for functional improvement with home ADLs and  higher level community activity Baseline: 13/80 03/04/24: 15/80 Goal status: IN PROGRESS  2.  Pt will self report bilateral knee pain no greater than 5/10 for improved comfort and functional ability Baseline: 10/10 at worst Goal status: IN PROGRESS   3.  Pt will increase 30 Second Sit to Stand rep count to no less than 9 reps for improved balance, strength, and functional mobility Baseline: 6 reps with UE 03/04/24: limited clearance upright positioning with fatigue 04/03/24: 10 reps with UE  Goal status: MET    4.  Pt will improve bilateral knee ext to at least 5 degrees to full extension for improved comfort and functional mobility Baseline: see chart Goal status: IN PROGRESS  5.  Pt will improve bilateral LE MMT to no less than 3+/5 for improved comfort and functional mobility Baseline:  see chart Goal status: IN PROGRESS  6. Patient will have at least wfl ROM of BL shoulders without significant increases in pain to demonstrate improved joint  mobility needed for ADL completion   Baseline: see chart  Goal status: initial     PLAN:  PT FREQUENCY: 1-2x/week  PT DURATION: 8 weeks  PLANNED INTERVENTIONS: [id number]- PT Re-evaluation, 97110-Therapeutic exercises, 97530- Therapeutic activity, [id number]- Neuromuscular re-education, 97535- Self Care, [id number]- Manual therapy, [id number]- Gait training, [phone]- Aquatic Therapy, [phone]- Electrical stimulation (unattended), [phone]- Electrical stimulation (manual), 20560 (1-2 muscles), 20561 (3+ muscles)- Dry Needling, and Patient/Family education  PLAN FOR NEXT SESSION: assess HEP response, LE strengthening, knee ROM  For all possible CPT codes, reference the Planned Interventions line above.     Check all conditions that are expected to impact treatment: {Conditions expected to impact treatment:Respiratory disorders, Diabetes mellitus, and Musculoskeletal disorders   If treatment provided at initial evaluation, no treatment charged due to lack of  authorization.      Harlene CHRISTELLA Persons PTA  04/03/24 12:25 PM    "

## 2024-04-07 ENCOUNTER — Ambulatory Visit

## 2024-04-07 ENCOUNTER — Ambulatory Visit: Admitting: Pharmacist

## 2024-04-07 DIAGNOSIS — M25511 Pain in right shoulder: Secondary | ICD-10-CM | POA: Diagnosis not present

## 2024-04-07 DIAGNOSIS — M6281 Muscle weakness (generalized): Secondary | ICD-10-CM

## 2024-04-07 DIAGNOSIS — R2689 Other abnormalities of gait and mobility: Secondary | ICD-10-CM

## 2024-04-07 DIAGNOSIS — G8929 Other chronic pain: Secondary | ICD-10-CM

## 2024-04-07 NOTE — Therapy (Signed)
 " OUTPATIENT PHYSICAL THERAPY LOWER EXTREMITY TREATMENT   Patient Name: Rita Bass MRN: [id number] DOB:[birth year], 59 y.o., female Today's Date: 04/08/2024  END OF SESSION:  PT End of Session - 04/07/24 1520     Visit Number 8    Number of Visits 17    Date for Recertification  05/05/24    Authorization Type [employer]    Authorization Time Period Approved 4 PT visits from 03/24/24-04/22/24    Authorization - Visit Number 4    Authorization - Number of Visits 4    PT Start Time 1525    PT Stop Time 1608    PT Time Calculation (min) 43 min    Activity Tolerance Patient tolerated treatment well    Behavior During Therapy WFL for tasks assessed/performed             Past Medical History:  Diagnosis Date   Arthritis    Asthma    Chronic pain of left knee    Complication of anesthesia    PONV   Diabetes (HCC)    GERD (gastroesophageal reflux disease)    H/O bronchitis    Hepatic steatosis    Internal hemorrhoids    Kidney stones    Obesity    Renal disorder    Shortness of breath dyspnea    Sleep apnea    past issues - at weight over 400lbs   Sleep apnea in adult    Polysomnogram pending.  Followed by Dr. Shellia   Past Surgical History:  Procedure Laterality Date   BIOPSY  11/01/2020   Procedure: BIOPSY;  Surgeon: San Sandor GAILS, DO;  Location: [facility];  Service: Gastroenterology;;   BIOPSY  05/11/2021   Procedure: BIOPSY;  Surgeon: San Sandor GAILS, DO;  Location: [facility];  Service: Gastroenterology;;   BIOPSY  11/29/2022   Procedure: BIOPSY;  Surgeon: San Sandor GAILS, DO;  Location: [facility];  Service: Gastroenterology;;   BIOPSY OF SKIN SUBCUTANEOUS TISSUE AND/OR MUCOUS MEMBRANE  03/17/2024   Procedure: BIOPSY, SKIN, SUBCUTANEOUS TISSUE, OR MUCOUS MEMBRANE;  Surgeon: San Sandor GAILS, DO;  Location: [facility];  Service: Gastroenterology;;   CESAREAN SECTION     6 c-sections   CHOLECYSTECTOMY     COLONOSCOPY WITH PROPOFOL   N/A 05/11/2021   Procedure: COLONOSCOPY WITH PROPOFOL ;  Surgeon: San Sandor GAILS, DO;  Location: [facility];  Service: Gastroenterology;  Laterality: N/A;   ESOPHAGOGASTRODUODENOSCOPY N/A 03/17/2024   Procedure: EGD (ESOPHAGOGASTRODUODENOSCOPY);  Surgeon: San Sandor GAILS, DO;  Location: [facility];  Service: Gastroenterology;  Laterality: N/A;   ESOPHAGOGASTRODUODENOSCOPY (EGD) WITH PROPOFOL  N/A 11/01/2020   Procedure: ESOPHAGOGASTRODUODENOSCOPY (EGD) WITH PROPOFOL ;  Surgeon: San Sandor GAILS, DO;  Location: [facility];  Service: Gastroenterology;  Laterality: N/A;   ESOPHAGOGASTRODUODENOSCOPY (EGD) WITH PROPOFOL  N/A 11/29/2022   Procedure: ESOPHAGOGASTRODUODENOSCOPY (EGD) WITH PROPOFOL ;  Surgeon: San Sandor GAILS, DO;  Location: [facility];  Service: Gastroenterology;  Laterality: N/A;   HERNIA REPAIR     KIDNEY STONE SURGERY     LEFT HEART CATH AND CORONARY ANGIOGRAPHY N/A 11/05/2023   Procedure: LEFT HEART CATH AND CORONARY ANGIOGRAPHY;  Surgeon: Anner Alm ORN, MD;  Location: [facility];  Service: Cardiovascular;  Laterality: N/A;   OOPHORECTOMY     TRANSTHORACIC ECHOCARDIOGRAM  09/2017   Technically difficult study.  Did not use Definity  contrast.  EF was 55 to 60% with moderate LVH and grade 1 diastolic dysfunction.  No significant valvular lesions noted.  No regional wall motion normality but difficult  to assess due to poor imaging.    TUBAL LIGATION     UPPER GASTROINTESTINAL ENDOSCOPY     Patient Active Problem List   Diagnosis Date Noted   Abdominal pain, epigastric 03/17/2024   History of deep venous thrombosis (DVT) of distal vein of left lower extremity 11/25/2023   Palpitations 06/27/2023   Coronary artery disease involving native coronary artery of native heart without angina pectoris 06/27/2023   Sleep disturbance 06/20/2023   Shortness of breath 03/18/2023   Chronic abdominal pain 12/24/2022   Medication management 12/21/2022   Chronic  Helicobacter pylori gastritis 11/29/2022   Gastric ulcer without hemorrhage or perforation 11/29/2022   Gastroesophageal reflux disease with esophagitis without hemorrhage 11/29/2022   Change in bowel habits 11/29/2022   Diarrhea 11/29/2022   Asthma 10/22/2022   Primary osteoarthritis of both knees 10/03/2022   Type 2 diabetes mellitus with diabetic neuropathy, unspecified (HCC) 04/30/2022   Chronic osteoarthritis 04/30/2022   Rash 01/15/2022   External hemorrhoid 09/15/2021   Ventral hernia 08/16/2021   Allergic conjunctivitis 07/28/2021   Grade II internal hemorrhoids    H. pylori infection 11/08/2020   Gastritis and gastroduodenitis    Lung nodule 10/26/2020   Ventral hernia without obstruction or gangrene 10/04/2020   Chronic diarrhea 09/16/2020   Abnormal Pap smear of cervix 08/18/2020   Hyperlipidemia associated with type 2 diabetes mellitus (HCC) 06/21/2020   Right hip pain 06/21/2020   Orthopnea 01/04/2020   Chronic cough 12/23/2019   Dyspepsia 05/22/2019   Primary hypertension 04/07/2019   Vaginal bleeding 01/27/2019   Low back pain 01/27/2019   BMI 50.0-59.9, adult (HCC) 12/05/2017   Diastolic dysfunction with heart failure (HCC) 10/25/2017   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   Diabetic nephropathy associated with type 2 diabetes mellitus (HCC) 10/25/2017   Type 2 diabetes mellitus with complication (HCC) 09/13/2017   Sleep apnea 08/16/2017   Chronic pain of right knee 07/13/2016   Morbid obesity (HCC) 02/04/2015   Menorrhagia with regular cycle 11/18/2014    PCP: [name], MD  REFERRING PROVIDER: McDiarmid, Krystal BIRCH, MD   REFERRING DIAG: M17.0 (ICD-10-CM) - Primary osteoarthritis of both knees   THERAPY DIAG:  Chronic pain of both shoulders  Chronic pain of left knee  Chronic pain of right knee  Other abnormalities of gait and mobility  Muscle weakness (generalized)  Rationale for Evaluation and Treatment: Rehabilitation  ONSET DATE:  Chronic  SUBJECTIVE:   SUBJECTIVE STATEMENT: Pt presents to PT with reports of continued severe pain in bilateral knees and bilateral shoulders.   EVAL: Pt presents to PT with reports of chronic bilateral knee pain with recent R>L. Pt is well known to PT, has complex PMH in setting of cardiovascular system. Recent concern for L DVT, awaiting results but they sent her home from ED on 12/16/2023. Also waiting until stable to address H. Pylori at [facility] in a few months. Lateral knee pain bilaterally, pain with transfers and overall mobility.   PERTINENT HISTORY: DM II, Neuropathy, HTN, CHF  PAIN:   Are you having pain?  Yes: NPRS scale: 4-5/10 Worst: 10/10 Pain location: bilateral knees Pain description: sharp, burning  Aggravating factors: standing, transfers, walking Relieving factors: rest, medication  PRECAUTIONS: Fall  RED FLAGS: None   WEIGHT BEARING RESTRICTIONS: No  FALLS:  Has patient fallen in last 6 months? Yes. Number of falls numerous - no falls to floor but has fallen backwards on bed or chair with transfers  LIVING ENVIRONMENT: Lives with: lives with their daughter  Lives in: House/apartment Stairs: Yes: External: 3 steps; on right going up Has following equipment at home: None  OCCUPATION: Not working  PLOF: Independent with basic ADLs  PATIENT GOALS: decrease knee pain, be able to move better  OBJECTIVE:  Note: Objective measures were completed at Evaluation unless otherwise noted.  DIAGNOSTIC FINDINGS: see imaging   PATIENT SURVEYS:  LEFS  Extreme difficulty/unable (0), Quite a bit of difficulty (1), Moderate difficulty (2), Little difficulty (3), No difficulty (4) Survey date:  01/06/2024 03/04/24  Any of your usual work, housework or school activities 0 1  2. Usual hobbies, recreational or sporting activities 0 1  3. Getting into/out of the bath 0 1  4. Walking between rooms 1 2  5. Putting on socks/shoes 1 3  6. Squatting  0 0  7.  Lifting an object, like a bag of groceries from the floor 3 2  8. Performing light activities around your home 3 2  9. Performing heavy activities around your home 0 0  10. Getting into/out of a car 0 1  11. Walking 2 blocks 0 0  12. Walking 1 mile 0 0  13. Going up/down 10 stairs (1 flight) 1 0  14. Standing for 1 hour 0 0  15.  sitting for 1 hour 2 0  16. Running on even ground 0 0  17. Running on uneven ground 0 0  18. Making sharp turns while running fast 0 0  19. Hopping  0 0  20. Rolling over in bed 2 2  Score total:  13/80 15/80    Annitta: 35/50    COGNITION: Overall cognitive status: Within functional limits for tasks assessed     SENSATION: WFL  POSTURE: rounded shoulders, forward head, increased lumbar lordosis, and large body habitus  PALPATION: TTP to distal R quad, patellar hypomobility TTP BL anterior shoulder and BL infraspinatus  SHOULDER ROM:  Flexion 70! BL Abduction 90! BL   LOWER EXTREMITY ROM:  Active ROM Right eval Left eval Right 03/04/24 Left 03/04/24  Hip flexion      Hip extension      Hip abduction      Hip adduction      Hip internal rotation      Hip external rotation      Knee flexion 101 90 95 94  Knee extension 13 27 -10 -28  Ankle dorsiflexion      Ankle plantarflexion      Ankle inversion      Ankle eversion       (Blank rows = not tested)   SHOULDER MMT 3+ flexion and abduction BL!   LOWER EXTREMITY MMT:  MMT Right eval Left eval Right 03/04/24 Left 03/04/24  Hip flexion 3+ 3+ 3+/5 4-/5  Hip extension      Hip abduction 3+ 3+ 3+/5 3+/5  Hip adduction      Hip internal rotation      Hip external rotation      Knee flexion 2+ 2+ 3-/5 3/5  Knee extension 2+ 2+ 3-/5 3-/5  Ankle dorsiflexion      Ankle plantarflexion      Ankle inversion      Ankle eversion       (Blank rows = not tested)  UPPER EXTREMITY ROM:  Active ROM Right 04/07/24 Left 04/07/24  Shoulder flexion 85 40  Shoulder extension     Shoulder abduction    Shoulder adduction    Shoulder extension    Shoulder internal rotation  Shoulder external rotation    Elbow flexion    Elbow extension    Wrist flexion    Wrist extension    Wrist ulnar deviation    Wrist radial deviation    Wrist pronation    Wrist supination     (Blank rows = not tested)   LOWER EXTREMITY SPECIAL TESTS:  DNT  FUNCTIONAL TESTS:  30 Second Sit to Stand: 6 reps with UE  03/04/24: 9 reps with UE use, decreased upright positioning as reps progress  GAIT: Distance walked: 62ft Assistive device utilized: None Level of assistance: SBA Comments: antalgic gait bilaterally, decreased gait speed, dec stride length   TREATMENT: 04/07/24 Therapeutic Activity: Assessment of tests/measures,  LAQ 2x10 3# Seated march 2x20 3# Pulleys shoulder flexion x 90 Standing row 2x10 TB Standing hip abd x 10 each FM squat x 10 B UE STS 4x5 - B UE high table Seated horizontal abd 2x10 YTB  04/03/24 Seated March 4# 10 x 3  Seated LAQ 4# 3 x 10  Seated CLAM BTB 10 x 3  Ball squeeze - increased pain so disc Seated h/s curl Blue 15 x 2 each  STS 4 x 3 30 sec STS 10 reps with UE mat table standard height    03/27/24 Therapeutic Activity: LAQ 2x10 3# Seated hamstring curl 2x10 blue band STS 4x5 - B UE high table Lateral walk YTB at high table x 4 laps Seated clamshell 2x15 blue band Seated ball squeeze 3x10 - 5 hold Seated march 3x20 3#   PATIENT EDUCATION:  Education details: HEP update Person educated: Patient Education method: Explanation, Demonstration, and Handouts Education comprehension: verbalized understanding and returned demonstration  HOME EXERCISE PROGRAM: Access Code: [email or web address] URL: [email or web address] Date: 03/04/2024 Prepared by: Stann Ohara  Exercises - Seated Quad Set  - 2 x daily - 4 x weekly - 3 sets - 15 reps - 2sec hold - Seated Hamstring Stretch  - 1 x daily - 4 x weekly - 3 reps - 30 sec  hold - Seated Long Arc Quad  - 1 x daily - 4 x weekly - 3 sets - 10 reps - 2 sec hold - Side to Side Weight Shift with Counter Support  - 1 x daily - 4 x weekly - 1 sets - 30 reps - 2 sec hold  ASSESSMENT:  CLINICAL IMPRESSION:  Pt was able to complete prescribed exercises with slightly improved tolerance. Over the course of PT she has slightly improved her functional mobility but continues to have severe knee and shoulder pain. Her shoulder flexion is well below functional normal values and Quick DASH shows severe disability for home ADLs and community activities. She would benefit from skilled PT services working on continuing to get strength and decrease knee and shoulder pain. Will continue as tolerated.   EVAL: Patient is a 59 y.o. F who was seen today for physical therapy evaluation and treatment for chronic bilateral knee pain. Pt has complex PMH and is well known to therapist, with physical findings consistent with physician impression as pt demonstrates significant decrease in bilateral knee strength and ROM. Functional mobility deficits observed in setting of 30 Second Sit to Stand and general transfer ability. LEFS score shows severe disability in performance of home ADLs and higher level community activities. Pt would benefit from skilled PT services working on improving LE strength and mobility in order to decrease pain and improve function.   OBJECTIVE IMPAIRMENTS: Abnormal gait, decreased activity tolerance, decreased balance, decreased endurance, decreased  mobility, difficulty walking, decreased ROM, decreased strength, postural dysfunction, obesity, and pain  ACTIVITY LIMITATIONS: carrying, lifting, bending, sitting, standing, squatting, sleeping, stairs, transfers, and locomotion level  PARTICIPATION LIMITATIONS: meal prep, cleaning, driving, shopping, community activity, occupation, and yard work  PERSONAL FACTORS: Time since onset of injury/illness/exacerbation and 3+  comorbidities: DM II, Neuropathy, HTN, CHF are also affecting patient's functional outcome.   REHAB POTENTIAL: Fair - complicated by PMH and chronicity of condition  CLINICAL DECISION MAKING: Evolving/moderate complexity  EVALUATION COMPLEXITY: Moderate   GOALS: Goals reviewed with patient? No  SHORT TERM GOALS: Target date: 04/04/2024   Pt will be compliant and knowledgeable with initial HEP for improved comfort and carryover Baseline: initial HEP given  01/21/24: has been I too much pain Goal status: ONGOING 03/04/24: requires inc cues for HEP recall 04/03/24: reports compliance   2.  Pt will self report bilateral knee pain no greater than 8/10 for improved comfort and functional ability Baseline: 10/10 at worst 04/03/24: sees some more better days, can still be 10/10 at night Goal status: IN PROGRESS   LONG TERM GOALS: Target date: 05/05/2024   Pt will improve LEFS to no less than 25/80 as proxy for functional improvement with home ADLs and higher level community activity Baseline: 13/80 03/04/24: 15/80  Goal status: IN PROGRESS  2.  Pt will self report bilateral knee pain no greater than 5/10 for improved comfort and functional ability Baseline: 10/10 at worst Goal status: IN PROGRESS   3.  Pt will increase 30 Second Sit to Stand rep count to no less than 9 reps for improved balance, strength, and functional mobility Baseline: 6 reps with UE 03/04/24: limited clearance upright positioning with fatigue 04/03/24: 10 reps with UE  Goal status: MET    4.  Pt will improve bilateral knee ext to at least 5 degrees to full extension for improved comfort and functional mobility Baseline: see chart Goal status: IN PROGRESS  5.  Pt will improve bilateral LE MMT to no less than 3+/5 for improved comfort and functional mobility Baseline:  see chart Goal status: IN PROGRESS  6. Patient will have at least wfl ROM of BL shoulders without significant increases in pain to demonstrate  improved joint mobility needed for ADL completion  Baseline: see chart  Goal status: initial  7. Pt will decrease Quick DASH disability score to no greater than 60 as proxy for functional improvement Baseline: 70% disability   Goal status: INITIAL  PLAN:  PT FREQUENCY: 1-2x/week  PT DURATION: 6 weeks  PLANNED INTERVENTIONS: 97164- PT Re-evaluation, 97110-Therapeutic exercises, 97530- Therapeutic activity, [id number]- Neuromuscular re-education, 97535- Self Care, [id number]- Manual therapy, [id number]- Gait training, [phone]- Aquatic Therapy, [phone]- Electrical stimulation (unattended), [phone]- Electrical stimulation (manual), 20560 (1-2 muscles), 20561 (3+ muscles)- Dry Needling, and Patient/Family education  PLAN FOR NEXT SESSION: assess HEP response, LE strengthening, knee ROM  For all possible CPT codes, reference the Planned Interventions line above.     Check all conditions that are expected to impact treatment: {Conditions expected to impact treatment:Respiratory disorders, Diabetes mellitus, and Musculoskeletal disorders   If treatment provided at initial evaluation, no treatment charged due to lack of authorization.      Alm JAYSON Kingdom PT  04/08/24 1:03 PM    "

## 2024-04-08 ENCOUNTER — Ambulatory Visit

## 2024-04-08 VITALS — BP 142/91 | Ht 64.0 in | Wt 311.0 lb

## 2024-04-08 DIAGNOSIS — M170 Bilateral primary osteoarthritis of knee: Secondary | ICD-10-CM | POA: Diagnosis present

## 2024-04-08 DIAGNOSIS — Z7901 Long term (current) use of anticoagulants: Secondary | ICD-10-CM

## 2024-04-08 DIAGNOSIS — E1165 Type 2 diabetes mellitus with hyperglycemia: Secondary | ICD-10-CM

## 2024-04-08 DIAGNOSIS — I10 Essential (primary) hypertension: Secondary | ICD-10-CM | POA: Diagnosis not present

## 2024-04-08 MED ORDER — KETOROLAC TROMETHAMINE 60 MG/2ML IM SOLN
30.0000 mg | Freq: Once | INTRAMUSCULAR | Status: AC
  Administered 2024-04-08: 30 mg via INTRA_ARTICULAR

## 2024-04-08 NOTE — Progress Notes (Addendum)
 " PCP: [name], MD  Subjective:   HPI: Patient is a 59 y.o. female here for bilateral knee pain.  Patient has a known history of bilateral arthritis.  She had previously visited with Dr. Renita for bilateral genicular chemo neurolysis.  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  Since then, she has had stents placed in her heart.  She has recently also been placed on Xarelto  10 mg due to her history of DVTs.  She has not started this medication yet.  She currently is an uncontrolled type II diabetic with the most recent A1c of 11.7.  She presents today with complaints of worsening knee pain.  Previous x-rays do show significant arthritis of both knees.  She states that her right knee is worse than her left.  She feels like it is sometimes catching and locking.  She is currently in physical therapy which was ordered by her PCP.  She denies any redness or swelling of her knees.  She denies any recent injuries or falls.  She is currently taking tramadol , gabapentin , Tylenol , Voltaren  gel, Cymbalta  and Flexeril  for pain.  Past Medical History:  Diagnosis Date   Arthritis    Asthma    Chronic pain of left knee    Complication of anesthesia    PONV   Diabetes (HCC)    GERD (gastroesophageal reflux disease)    H/O bronchitis    Hepatic steatosis    Internal hemorrhoids    Kidney stones    Obesity    Renal disorder    Shortness of breath dyspnea    Sleep apnea    past issues - at weight over 400lbs   Sleep apnea in adult    Polysomnogram pending.  Followed by Dr. Sood    Medications Ordered Prior to Encounter[1]  BP (!) 142/91   Ht 5' 4 (1.626 m)   Wt (!) 311 lb (141.1 kg)   LMP 04/05/2016   BMI 53.38 kg/m        Objective:   Physical Exam:  Gen: NAD, comfortable in exam room Bilateral knees Inspection: No erythema, edema or warmth. Palpation: Tender to palpation of the medial and lateral joint line ROM:  Right can only send about 10 degrees, limited flexion to about 90 degrees due to pain, left has full extension to 0 degrees, flexion to 120 degrees Special Tests: Negative valgus and varus testing bilaterally, positive McMurray's both medial laterally on the right, negative McMurray's on the left Neuro: Neurovascular intact distally  Assessment/Plan:   Rita Bass is a 59 y.o. female who was seen today for the following: 1. Primary osteoarthritis of both knees (Primary)  2. Uncontrolled type 2 diabetes mellitus with hyperglycemia (HCC)  3. Primary hypertension  4. Morbid obesity (HCC)  5. Chronic anticoagulation - Patient has a complex medical presentation - Most recent A1c is 11.7 - She is going to be started on Xarelto  10 mg but has not picked this medication up yet - She is currently in physical therapy - Her body habitus is adding to her complexity - Discussed treatment options today including a Toradol  injection - We are avoiding steroids due to uncontrolled diabetes - Discussed risk of Toradol  injection with patient who elected to proceed - She tolerated the injections well - We will have her follow-up with Dr. Renita for discussion of genicular block - She does have follow-up with her PCP and  clinical pharmacist to hopefully improve her diabetes management  Procedure: Real-time injection of the bilateral knee Indications: pain 2/2 osteoarthritis Patient: Rita Bass [birth year] Procedure date: 04/08/2024  Risks, benefits, and alternatives were discussed with the patient.  Verbal and written, informed consent obtained.  Time-out conducted.  Noted no overlying erythema, induration, or other signs of local infection.  Skin prepped in a sterile fashion using alcohol.  Local anesthesia: Topical Ethyl chloride  Needle: 22 ga 2 inch Injection: 30 mg Toradol  With sterile technique and under real time ultrasound guidance, the bilateral joint space was injected  using the in plain approach.  Bandage was placed.   The procedure was completed without difficulty. Patient tolerated well and no immediate complications.   Images permanently stored and available for review in the ultrasound unit.  Post procedural instructions were discussed and printed material given.   Impression: Technically successful ultrasound guided injection.  Follow-up/Education:   Return if symptoms worsen or fail to improve.   May return sooner as needed and encouraged to call/e-mail for additional questions or  worsening symptoms in the interim.  Krystal Lowing, DO Sports Medicine Fellow 04/08/2024 1:32 PM     [1]  Current Outpatient Medications on File Prior to Visit  Medication Sig Dispense Refill   Accu-Chek Softclix Lancets lancets USE IN THE MORNING, AT NOON AND AT BEDTIME 100 each 12   acetaminophen  (TYLENOL ) 650 MG CR tablet Take 650-1,300 mg by mouth every 8 (eight) hours as needed for pain. Last dose (2) around 0930 am.     albuterol  (PROVENTIL ) (5 MG/ML) 0.5% nebulizer solution Take 0.5 mLs (2.5 mg total) by nebulization every 6 (six) hours as needed for wheezing or shortness of breath. 20 mL 0   atorvastatin  (LIPITOR) 40 MG tablet Take 1 tablet (40 mg total) by mouth daily. 90 tablet 3   Blood Glucose Monitoring Suppl DEVI 1 each by Does not apply route in the morning, at noon, and at bedtime. May substitute to any manufacturer covered by patient's insurance. 1 each 0   colestipol  (COLESTID ) 1 g tablet TAKE 2 TABLETS(2 GRAMS) BY MOUTH TWICE DAILY 360 tablet 0   Continuous Glucose Receiver (FREESTYLE LIBRE 3 READER) DEVI 1 Device as directed.     Continuous Glucose Sensor (FREESTYLE LIBRE 3 PLUS SENSOR) MISC Change sensor every 15 days. 2 each 11   cyclobenzaprine  (FLEXERIL ) 10 MG tablet Take 1 tablet (10 mg total) by mouth 2 (two) times daily as needed for muscle spasms. 60 tablet 2   diclofenac  Sodium (VOLTAREN ) 1 % GEL Apply 2 g topically 4 (four) times daily.  (Patient taking differently: Apply 2 g topically in the morning and at bedtime.) 100 g 0   DULoxetine  (CYMBALTA ) 60 MG capsule Take 1 capsule (60 mg total) by mouth daily. 30 capsule 3   empagliflozin  (JARDIANCE ) 25 MG TABS tablet Take 1 tablet (25 mg total) by mouth daily. 90 tablet 3   gabapentin  (NEURONTIN ) 100 MG capsule Take 6 capsules (600 mg total) by mouth 3 (three) times daily. 360 capsule 2   glucose blood (ACCU-CHEK GUIDE) test strip 1 each by Other route daily. for testing 100 strip 1   hydrocortisone  (ANUSOL -HC) 2.5 % rectal cream Place 1 Application rectally 2 (two) times daily. 30 g 2   insulin  glargine (LANTUS ) 100 UNIT/ML Solostar Pen Inject 48 Units into the skin every morning. Inject 30 units subcutaneously every morning. Increase by 2 units each day until sugar < 150. Maximum daily dose 50 units.  15 mL 3   Insulin  Pen Needle (PEN NEEDLES) 32G X 5 MM MISC Use as directed 90 each 3   metFORMIN  (GLUCOPHAGE -XR) 500 MG 24 hr tablet Take 2 tablets (1,000 mg total) by mouth 2 (two) times daily. 360 tablet 3   mometasone -formoterol  (DULERA ) 100-5 MCG/ACT AERO Inhale 2 puffs into the lungs daily. (Patient taking differently: Inhale 2 puffs into the lungs as needed. Patient reports using when she has respiratory symptoms, typically in the Winter) 39 g 3   nystatin  (MYCOSTATIN /NYSTOP ) powder Apply 1 Application topically 2 (two) times daily. (Patient taking differently: Apply 1 Application topically 2 (two) times daily as needed (heat rash).) 60 g 5   olopatadine  (PATANOL) 0.1 % ophthalmic solution Place 1 drop into both eyes 2 (two) times daily. (Patient taking differently: Place 1 drop into both eyes 2 (two) times daily as needed for allergies.) 5 mL 12   pantoprazole  (PROTONIX ) 40 MG tablet Take 1 tablet (40 mg total) by mouth daily. Patient needs follow up appointment for future refills. Please call [phone] to schedule an appointment. (Patient taking differently: Take 40 mg by mouth  daily as needed (reflux). Patient needs follow up appointment for future refills. Please call [phone] to schedule an appointment.) 90 tablet 0   rivaroxaban  (XARELTO ) 10 MG TABS tablet Take 1 tablet (10 mg total) by mouth daily. With a meal 90 tablet 3   Semaglutide , 2 MG/DOSE, (OZEMPIC , 2 MG/DOSE,) 8 MG/3ML SOPN Inject 2 mg into the skin once a week. 3 mL 5   spironolactone  (ALDACTONE ) 25 MG tablet Take 1 tablet (25 mg total) by mouth daily. 90 tablet 3   traMADol  (ULTRAM ) 50 MG tablet Take 1 tablet (50 mg total) by mouth every 12 (twelve) hours as needed. Not to be refilled before 30 days 60 tablet 0   traZODone  (DESYREL ) 50 MG tablet Take 1 tablet (50 mg total) by mouth at bedtime as needed for sleep. 30 tablet 3   No current facility-administered medications on file prior to visit.   "

## 2024-04-08 NOTE — Patient Instructions (Signed)
-   You had injection of Toradol  into the bilateral knees today - Follow-up with Dr. Renita for discussion about genicular block - Call with any complications of your injection

## 2024-04-10 ENCOUNTER — Ambulatory Visit

## 2024-04-17 ENCOUNTER — Ambulatory Visit: Admitting: Pain Medicine

## 2024-04-27 ENCOUNTER — Ambulatory Visit: Admitting: Pharmacist

## 2024-04-28 ENCOUNTER — Ambulatory Visit: Payer: Self-pay | Admitting: Family Medicine

## 2024-04-28 ENCOUNTER — Ambulatory Visit: Admitting: Pharmacist

## 2024-04-29 ENCOUNTER — Ambulatory Visit

## 2024-05-06 ENCOUNTER — Ambulatory Visit: Admitting: Physical Therapy

## 2024-05-08 ENCOUNTER — Ambulatory Visit: Admitting: Pain Medicine
